# Patient Record
Sex: Female | Born: 1960
Health system: Southern US, Community
[De-identification: ages and names within clinical notes are randomized; demographics above are authoritative.]

## PROBLEM LIST (undated history)

## (undated) ENCOUNTER — Emergency Department: Admission: EM | Source: Home / Self Care

## (undated) ENCOUNTER — Ambulatory Visit (HOSPITAL_COMMUNITY): Admission: EM

## (undated) ENCOUNTER — Emergency Department (HOSPITAL_COMMUNITY): Admission: EM | Payer: BLUE CROSS/BLUE SHIELD | Source: Home / Self Care

## (undated) DIAGNOSIS — F329 Major depressive disorder, single episode, unspecified: Secondary | ICD-10-CM

## (undated) DIAGNOSIS — F419 Anxiety disorder, unspecified: Secondary | ICD-10-CM

## (undated) DIAGNOSIS — I10 Essential (primary) hypertension: Secondary | ICD-10-CM

## (undated) DIAGNOSIS — J189 Pneumonia, unspecified organism: Secondary | ICD-10-CM

## (undated) DIAGNOSIS — M1990 Unspecified osteoarthritis, unspecified site: Secondary | ICD-10-CM

## (undated) DIAGNOSIS — G8250 Quadriplegia, unspecified: Secondary | ICD-10-CM

## (undated) DIAGNOSIS — R011 Cardiac murmur, unspecified: Secondary | ICD-10-CM

## (undated) DIAGNOSIS — K219 Gastro-esophageal reflux disease without esophagitis: Secondary | ICD-10-CM

## (undated) DIAGNOSIS — E119 Type 2 diabetes mellitus without complications: Secondary | ICD-10-CM

## (undated) DIAGNOSIS — M5000 Cervical disc disorder with myelopathy, unspecified cervical region: Secondary | ICD-10-CM

## (undated) DIAGNOSIS — F319 Bipolar disorder, unspecified: Secondary | ICD-10-CM

## (undated) DIAGNOSIS — F32A Depression, unspecified: Secondary | ICD-10-CM

## (undated) HISTORY — PX: ABDOMINAL HYSTERECTOMY: SHX81

## (undated) HISTORY — DX: Quadriplegia, unspecified: G82.50

## (undated) HISTORY — DX: Cervical disc disorder with myelopathy, unspecified cervical region: M50.00

---

## 2000-09-07 ENCOUNTER — Inpatient Hospital Stay (HOSPITAL_COMMUNITY): Admission: AD | Admit: 2000-09-07 | Discharge: 2000-09-07 | Payer: Self-pay | Admitting: Obstetrics

## 2000-09-18 ENCOUNTER — Encounter: Payer: Self-pay | Admitting: Obstetrics and Gynecology

## 2000-09-18 ENCOUNTER — Encounter: Admission: RE | Admit: 2000-09-18 | Discharge: 2000-09-18 | Payer: Self-pay | Admitting: Obstetrics and Gynecology

## 2000-09-23 ENCOUNTER — Encounter: Payer: Self-pay | Admitting: Obstetrics and Gynecology

## 2000-09-23 ENCOUNTER — Encounter: Admission: RE | Admit: 2000-09-23 | Discharge: 2000-09-23 | Payer: Self-pay | Admitting: Obstetrics and Gynecology

## 2010-07-11 ENCOUNTER — Encounter: Admission: RE | Admit: 2010-07-11 | Discharge: 2010-07-11 | Payer: Self-pay | Admitting: Internal Medicine

## 2011-07-23 ENCOUNTER — Emergency Department (HOSPITAL_COMMUNITY)
Admission: EM | Admit: 2011-07-23 | Discharge: 2011-07-23 | Disposition: A | Discharge status: Home / Self Care | Payer: PRIVATE HEALTH INSURANCE | Attending: Emergency Medicine | Admitting: Emergency Medicine

## 2011-07-23 DIAGNOSIS — I10 Essential (primary) hypertension: Secondary | ICD-10-CM | POA: Insufficient documentation

## 2011-07-23 DIAGNOSIS — R109 Unspecified abdominal pain: Secondary | ICD-10-CM | POA: Insufficient documentation

## 2011-07-23 DIAGNOSIS — E119 Type 2 diabetes mellitus without complications: Secondary | ICD-10-CM | POA: Insufficient documentation

## 2011-07-23 DIAGNOSIS — K625 Hemorrhage of anus and rectum: Secondary | ICD-10-CM | POA: Insufficient documentation

## 2011-07-23 LAB — DIFFERENTIAL
Basophils Absolute: 0.1 10*3/uL (ref 0.0–0.1)
Basophils Relative: 1 % (ref 0–1)
Eosinophils Absolute: 0.1 10*3/uL (ref 0.0–0.7)
Eosinophils Relative: 1 % (ref 0–5)
Lymphocytes Relative: 34 % (ref 12–46)
Lymphs Abs: 3.5 10*3/uL (ref 0.7–4.0)
Monocytes Absolute: 0.5 10*3/uL (ref 0.1–1.0)
Monocytes Relative: 5 % (ref 3–12)
Neutro Abs: 6 10*3/uL (ref 1.7–7.7)
Neutrophils Relative %: 59 % (ref 43–77)

## 2011-07-23 LAB — CBC
HCT: 38.8 % (ref 36.0–46.0)
Hemoglobin: 13 g/dL (ref 12.0–15.0)
MCH: 29.7 pg (ref 26.0–34.0)
MCHC: 33.5 g/dL (ref 30.0–36.0)
MCV: 88.6 fL (ref 78.0–100.0)
Platelets: 295 10*3/uL (ref 150–400)
RBC: 4.38 MIL/uL (ref 3.87–5.11)
RDW: 14.5 % (ref 11.5–15.5)
WBC: 10.1 10*3/uL (ref 4.0–10.5)

## 2011-07-23 LAB — BASIC METABOLIC PANEL
CO2: 29 mEq/L (ref 19–32)
Calcium: 9.9 mg/dL (ref 8.4–10.5)
Glucose, Bld: 119 mg/dL — ABNORMAL HIGH (ref 70–99)
Potassium: 3 mEq/L — ABNORMAL LOW (ref 3.5–5.1)
Sodium: 143 mEq/L (ref 135–145)

## 2011-09-18 ENCOUNTER — Other Ambulatory Visit: Payer: Self-pay | Admitting: Internal Medicine

## 2011-09-18 DIAGNOSIS — Z1231 Encounter for screening mammogram for malignant neoplasm of breast: Secondary | ICD-10-CM

## 2011-10-14 ENCOUNTER — Ambulatory Visit
Admission: RE | Admit: 2011-10-14 | Discharge: 2011-10-14 | Disposition: A | Discharge status: Home / Self Care | Payer: PRIVATE HEALTH INSURANCE | Source: Ambulatory Visit | Attending: Internal Medicine | Admitting: Internal Medicine

## 2011-10-14 DIAGNOSIS — Z1231 Encounter for screening mammogram for malignant neoplasm of breast: Secondary | ICD-10-CM

## 2012-08-12 ENCOUNTER — Encounter (HOSPITAL_COMMUNITY): Payer: Self-pay | Admitting: *Deleted

## 2012-08-12 ENCOUNTER — Inpatient Hospital Stay (HOSPITAL_COMMUNITY)
Admission: AD | Admit: 2012-08-12 | Discharge: 2012-08-12 | Disposition: A | Discharge status: Home / Self Care | Payer: PRIVATE HEALTH INSURANCE | Source: Ambulatory Visit | Attending: Obstetrics & Gynecology | Admitting: Obstetrics & Gynecology

## 2012-08-12 DIAGNOSIS — T148XXA Other injury of unspecified body region, initial encounter: Secondary | ICD-10-CM

## 2012-08-12 DIAGNOSIS — S335XXA Sprain of ligaments of lumbar spine, initial encounter: Secondary | ICD-10-CM | POA: Insufficient documentation

## 2012-08-12 DIAGNOSIS — R109 Unspecified abdominal pain: Secondary | ICD-10-CM | POA: Insufficient documentation

## 2012-08-12 HISTORY — DX: Gastro-esophageal reflux disease without esophagitis: K21.9

## 2012-08-12 HISTORY — DX: Type 2 diabetes mellitus without complications: E11.9

## 2012-08-12 LAB — URINALYSIS, ROUTINE W REFLEX MICROSCOPIC
Glucose, UA: NEGATIVE mg/dL
Hgb urine dipstick: NEGATIVE
Specific Gravity, Urine: >1.03 — ABNORMAL HIGH (ref 1.005–1.030)

## 2012-08-12 MED ORDER — CYCLOBENZAPRINE HCL 10 MG PO TABS: 5.0000 mg | ORAL_TABLET | Freq: Two times a day (BID) | ORAL | Status: DC | PRN

## 2012-08-12 NOTE — MAU Provider Note (Signed)
History     CSN: [id number]  Arrival date and time: 08/12/12 1121   First Provider Initiated Contact with Patient 08/12/12 1151      Chief Complaint  Patient presents with  . Abdominal Pain   HPI Ashley [name] is a 51 y.o. female who presents to MAU with left side pain pain. The pan started 2 days ago and is getting worse. The pain is located in the left side. She rates the pain as 7/10. The pain comes and goes. The pain increaseswith standing, going from sitting to standing . She reports that she remembers going up the steps at home and feeling a pulling in her left side. The pain has continued off and on since then. Has not taken anything for pain. Total abdominal hysterectomy. She denies abdominal pain. Regular health care at [facility]. The history was provided by the patient.  OB History    Grav Para Term Preterm Abortions TAB SAB Ect Mult Living   4 3 2  1    1 4       Past Medical History  Diagnosis Date  . Diabetes mellitus without complication   . GERD (gastroesophageal reflux disease)     Past Surgical History  Procedure Date  . Abdominal hysterectomy     No family history on file.  History  Substance Use Topics  . Smoking status: Current Every Day Smoker  . Smokeless tobacco: Not on file  . Alcohol Use: No    Allergies: No Known Allergies  Prescriptions prior to admission  Medication Sig Dispense Refill  . fish oil-omega-3 fatty acids 1000 MG capsule Take 1 g by mouth daily.      . metFORMIN (GLUCOPHAGE) 500 MG tablet Take 1,000 mg by mouth daily with breakfast.      . pravastatin (PRAVACHOL) 20 MG tablet Take 20 mg by mouth daily.      . QUEtiapine (SEROQUEL) 400 MG tablet Take 800 mg by mouth at bedtime.      . sertraline (ZOLOFT) 100 MG tablet Take 200 mg by mouth at bedtime.      . thiamine (VITAMIN B-1) 100 MG tablet Take 100 mg by mouth daily.      Marland Kitchen zolpidem (AMBIEN CR) 6.25 MG CR tablet Take 6.25 mg by mouth at bedtime as needed. For sleep         Review of Systems  Constitutional: Negative for fever, chills and weight loss.  HENT: Negative for ear pain, nosebleeds, congestion, sore throat and neck pain.   Eyes: Negative for blurred vision, double vision, photophobia and pain.  Respiratory: Negative for cough, shortness of breath and wheezing.   Cardiovascular: Negative for chest pain, palpitations and leg swelling.  Gastrointestinal: Negative for heartburn, nausea, vomiting, abdominal pain, diarrhea and constipation.  Genitourinary: Positive for frequency. Negative for dysuria and urgency.  Musculoskeletal: Positive for back pain. Negative for myalgias.  Skin: Negative for itching and rash.  Neurological: Negative for dizziness, sensory change, speech change, seizures, weakness and headaches.  Endo/Heme/Allergies: Does not bruise/bleed easily.  Psychiatric/Behavioral: Negative for depression. The patient is nervous/anxious. The patient does not have insomnia.    Physical Exam   Blood pressure 131/74, pulse 90, temperature 97.9 F (36.6 C), temperature source Oral, resp. rate 18, height 5\' 7"  (1.702 m), weight 181 lb (82.101 kg).  Physical Exam  Nursing note and vitals reviewed. Constitutional: She is oriented to person, place, and time. She appears well-developed and well-nourished. No distress.  HENT:  Head: Normocephalic  and atraumatic.  Eyes: EOM are normal.  Neck: Neck supple.  Cardiovascular: Normal rate.   Respiratory: Effort normal.  GI: Soft. There is no tenderness.  Musculoskeletal: She exhibits no edema.       Tender with palpation and range of motion left lumbar area.   Neurological: She is alert and oriented to person, place, and time. She has normal strength and normal reflexes. No cranial nerve deficit or sensory deficit. Coordination normal.       Pedal pulses strong and equal bilateral.  Skin: Skin is warm and dry.  Psychiatric: She has a normal mood and affect. Her behavior is normal. Judgment and  thought content normal.   Results for orders placed during the hospital encounter of 08/12/12 (from the past 24 hour(s))  URINALYSIS, ROUTINE W REFLEX MICROSCOPIC     Status: Abnormal   Collection Time   08/12/12 11:30 AM      Component Value Range   Color, Urine YELLOW  YELLOW   APPearance CLEAR  CLEAR   Specific Gravity, Urine >1.030 (*) 1.005 - 1.030   pH 6.0  5.0 - 8.0   Glucose, UA NEGATIVE  NEGATIVE mg/dL   Hgb urine dipstick NEGATIVE  NEGATIVE   Bilirubin Urine NEGATIVE  NEGATIVE   Ketones, ur NEGATIVE  NEGATIVE mg/dL   Protein, ur NEGATIVE  NEGATIVE mg/dL   Urobilinogen, UA 0.2  0.0 - 1.0 mg/dL   Nitrite NEGATIVE  NEGATIVE   Leukocytes, UA NEGATIVE  NEGATIVE   Assessment: 51 y.o. female with left lumbar strain  Plan:  Tylenol prn   Rx flexeril   Follow up with PCP @ [facility]  Discussed with the patient and all questioned fully answered. .   Medication List     As of 08/12/2012 12:28 PM    START taking these medications         cyclobenzaprine 10 MG tablet   Commonly known as: FLEXERIL   Take 0.5 tablets (5 mg total) by mouth 2 (two) times daily as needed for muscle spasms.      CONTINUE taking these medications         fish oil-omega-3 fatty acids 1000 MG capsule      metFORMIN 500 MG tablet   Commonly known as: GLUCOPHAGE      pravastatin 20 MG tablet   Commonly known as: PRAVACHOL      QUEtiapine 400 MG tablet   Commonly known as: SEROQUEL      sertraline 100 MG tablet   Commonly known as: ZOLOFT      thiamine 100 MG tablet   Commonly known as: VITAMIN B-1      zolpidem 6.25 MG CR tablet   Commonly known as: AMBIEN CR          Where to get your medications    These are the prescriptions that you need to pick up.   You may get these medications from any pharmacy.         cyclobenzaprine 10 MG tablet            MAU Course  Procedures Ashley Miyamoto, RN, FNP, [facility] 08/12/2012, 12:20 PM

## 2012-08-12 NOTE — MAU Provider Note (Signed)
Attestation of Attending Supervision of Advanced Practitioner (CNM/NP): Evaluation and management procedures were performed by the Advanced Practitioner under my supervision and collaboration.  I have reviewed the Advanced Practitioner's note and chart, and I agree with the management and plan.  HARRAWAY-SMITH, Alyannah Sanks 4:14 PM     

## 2012-08-12 NOTE — MAU Note (Signed)
Pt reports having pain in her left abd and side that started about 5 days ago. Had unprotected intercourse last week. Denies vaginal discharge.

## 2013-01-04 ENCOUNTER — Other Ambulatory Visit: Payer: Self-pay

## 2013-01-04 DIAGNOSIS — Z1231 Encounter for screening mammogram for malignant neoplasm of breast: Secondary | ICD-10-CM

## 2013-02-03 ENCOUNTER — Ambulatory Visit
Admission: RE | Admit: 2013-02-03 | Discharge: 2013-02-03 | Disposition: A | Discharge status: Home / Self Care | Payer: Self-pay | Source: Ambulatory Visit

## 2013-02-03 DIAGNOSIS — Z1231 Encounter for screening mammogram for malignant neoplasm of breast: Secondary | ICD-10-CM

## 2013-02-07 ENCOUNTER — Other Ambulatory Visit: Payer: Self-pay | Admitting: Nurse Practitioner

## 2013-02-07 ENCOUNTER — Ambulatory Visit
Admission: RE | Admit: 2013-02-07 | Discharge: 2013-02-07 | Disposition: A | Discharge status: Home / Self Care | Payer: Federal, State, Local not specified - PPO | Source: Ambulatory Visit | Attending: Nurse Practitioner | Admitting: Nurse Practitioner

## 2013-02-07 DIAGNOSIS — R062 Wheezing: Secondary | ICD-10-CM

## 2013-02-07 DIAGNOSIS — R0989 Other specified symptoms and signs involving the circulatory and respiratory systems: Secondary | ICD-10-CM

## 2013-04-30 ENCOUNTER — Encounter (HOSPITAL_COMMUNITY): Payer: Self-pay | Admitting: Emergency Medicine

## 2013-04-30 ENCOUNTER — Inpatient Hospital Stay (HOSPITAL_COMMUNITY)
Admission: EM | Admit: 2013-04-30 | Discharge: 2013-05-03 | DRG: 392 | Disposition: A | Discharge status: Home / Self Care | Payer: Medicare Other | Attending: Internal Medicine | Admitting: Internal Medicine

## 2013-04-30 DIAGNOSIS — E872 Acidosis, unspecified: Secondary | ICD-10-CM | POA: Diagnosis present

## 2013-04-30 DIAGNOSIS — E1165 Type 2 diabetes mellitus with hyperglycemia: Secondary | ICD-10-CM | POA: Diagnosis present

## 2013-04-30 DIAGNOSIS — E860 Dehydration: Secondary | ICD-10-CM | POA: Diagnosis present

## 2013-04-30 DIAGNOSIS — F1210 Cannabis abuse, uncomplicated: Secondary | ICD-10-CM | POA: Diagnosis present

## 2013-04-30 DIAGNOSIS — F3289 Other specified depressive episodes: Secondary | ICD-10-CM | POA: Diagnosis present

## 2013-04-30 DIAGNOSIS — F17200 Nicotine dependence, unspecified, uncomplicated: Secondary | ICD-10-CM | POA: Diagnosis present

## 2013-04-30 DIAGNOSIS — K219 Gastro-esophageal reflux disease without esophagitis: Secondary | ICD-10-CM | POA: Diagnosis present

## 2013-04-30 DIAGNOSIS — K529 Noninfective gastroenteritis and colitis, unspecified: Secondary | ICD-10-CM | POA: Diagnosis present

## 2013-04-30 DIAGNOSIS — E785 Hyperlipidemia, unspecified: Secondary | ICD-10-CM | POA: Diagnosis present

## 2013-04-30 DIAGNOSIS — E119 Type 2 diabetes mellitus without complications: Secondary | ICD-10-CM

## 2013-04-30 DIAGNOSIS — F329 Major depressive disorder, single episode, unspecified: Secondary | ICD-10-CM

## 2013-04-30 DIAGNOSIS — A09 Infectious gastroenteritis and colitis, unspecified: Principal | ICD-10-CM | POA: Diagnosis present

## 2013-04-30 LAB — CBC WITH DIFFERENTIAL/PLATELET
Eosinophils Relative: 0 % (ref 0–5)
HCT: 40.1 % (ref 36.0–46.0)
Lymphocytes Relative: 12 % (ref 12–46)
Lymphs Abs: 2.2 10*3/uL (ref 0.7–4.0)
MCV: 89.5 fL (ref 78.0–100.0)
Monocytes Absolute: 1.3 10*3/uL — ABNORMAL HIGH (ref 0.1–1.0)
RBC: 4.48 MIL/uL (ref 3.87–5.11)
WBC: 18.3 10*3/uL — ABNORMAL HIGH (ref 4.0–10.5)

## 2013-04-30 NOTE — ED Notes (Signed)
PT. REPORTS RECTAL BLEEDING / BLOODY STOOLS ONSET YESTERDAY , DENIES FEVER OR CHILLS , NO ABDOMINAL PAIN .

## 2013-05-01 ENCOUNTER — Emergency Department (HOSPITAL_COMMUNITY): Payer: Medicare Other

## 2013-05-01 ENCOUNTER — Encounter (HOSPITAL_COMMUNITY): Payer: Self-pay | Admitting: Radiology

## 2013-05-01 DIAGNOSIS — E1165 Type 2 diabetes mellitus with hyperglycemia: Secondary | ICD-10-CM | POA: Diagnosis present

## 2013-05-01 DIAGNOSIS — K5289 Other specified noninfective gastroenteritis and colitis: Secondary | ICD-10-CM

## 2013-05-01 DIAGNOSIS — E785 Hyperlipidemia, unspecified: Secondary | ICD-10-CM | POA: Diagnosis present

## 2013-05-01 DIAGNOSIS — E119 Type 2 diabetes mellitus without complications: Secondary | ICD-10-CM

## 2013-05-01 DIAGNOSIS — K529 Noninfective gastroenteritis and colitis, unspecified: Secondary | ICD-10-CM | POA: Diagnosis present

## 2013-05-01 LAB — CBC WITH DIFFERENTIAL/PLATELET
Basophils Absolute: 0 10*3/uL (ref 0.0–0.1)
Eosinophils Relative: 0 % (ref 0–5)
HCT: 38 % (ref 36.0–46.0)
Hemoglobin: 13.5 g/dL (ref 12.0–15.0)
Lymphocytes Relative: 19 % (ref 12–46)
MCV: 89.4 fL (ref 78.0–100.0)
Monocytes Absolute: 1.3 10*3/uL — ABNORMAL HIGH (ref 0.1–1.0)
Monocytes Relative: 7 % (ref 3–12)
Neutro Abs: 13.7 10*3/uL — ABNORMAL HIGH (ref 1.7–7.7)
RDW: 13.8 % (ref 11.5–15.5)
WBC: 18.5 10*3/uL — ABNORMAL HIGH (ref 4.0–10.5)

## 2013-05-01 LAB — GLUCOSE, CAPILLARY
Glucose-Capillary: 226 mg/dL — ABNORMAL HIGH (ref 70–99)
Glucose-Capillary: 179 mg/dL — ABNORMAL HIGH (ref 70–99)

## 2013-05-01 LAB — COMPREHENSIVE METABOLIC PANEL
ALT: 19 U/L (ref 0–35)
Albumin: 4 g/dL (ref 3.5–5.2)
BUN: 26 mg/dL — ABNORMAL HIGH (ref 6–23)
CO2: 18 mEq/L — ABNORMAL LOW (ref 19–32)
Calcium: 10.4 mg/dL (ref 8.4–10.5)
Calcium: 9.7 mg/dL (ref 8.4–10.5)
Creatinine, Ser: 1.11 mg/dL — ABNORMAL HIGH (ref 0.50–1.10)
GFR calc Af Amer: 65 mL/min — ABNORMAL LOW (ref >90)
GFR calc Af Amer: 79 mL/min — ABNORMAL LOW (ref >90)
GFR calc non Af Amer: 56 mL/min — ABNORMAL LOW (ref >90)
Glucose, Bld: 234 mg/dL — ABNORMAL HIGH (ref 70–99)
Glucose, Bld: 209 mg/dL — ABNORMAL HIGH (ref 70–99)
Sodium: 132 mEq/L — ABNORMAL LOW (ref 135–145)
Total Bilirubin: 0.4 mg/dL (ref 0.3–1.2)
Total Protein: 7.5 g/dL (ref 6.0–8.3)

## 2013-05-01 LAB — URINALYSIS, ROUTINE W REFLEX MICROSCOPIC
Bilirubin Urine: NEGATIVE
Nitrite: NEGATIVE
Protein, ur: 100 mg/dL — AB
Specific Gravity, Urine: 1.024 (ref 1.005–1.030)
Urobilinogen, UA: 0.2 mg/dL (ref 0.0–1.0)

## 2013-05-01 LAB — URINE MICROSCOPIC-ADD ON

## 2013-05-01 MED ORDER — IOHEXOL 300 MG/ML  SOLN
100.0000 mL | Freq: Once | INTRAMUSCULAR | Status: AC | PRN
  Administered 2013-05-01: 100 mL via INTRAVENOUS

## 2013-05-01 MED ORDER — SIMVASTATIN 10 MG PO TABS
10.0000 mg | ORAL_TABLET | Freq: Every day | ORAL | Status: DC
  Administered 2013-05-02: 10 mg via ORAL
  Filled 2013-05-01 (×3): qty 1

## 2013-05-01 MED ORDER — INSULIN ASPART 100 UNIT/ML ~~LOC~~ SOLN
0.0000 [IU] | Freq: Three times a day (TID) | SUBCUTANEOUS | Status: DC
  Administered 2013-05-01 (×2): 3 [IU] via SUBCUTANEOUS
  Administered 2013-05-01 – 2013-05-02 (×2): 2 [IU] via SUBCUTANEOUS
  Administered 2013-05-02: 3 [IU] via SUBCUTANEOUS

## 2013-05-01 MED ORDER — ONDANSETRON HCL 4 MG PO TABS: 4.0000 mg | ORAL_TABLET | Freq: Four times a day (QID) | ORAL | Status: DC | PRN

## 2013-05-01 MED ORDER — VITAMIN B-1 100 MG PO TABS
100.0000 mg | ORAL_TABLET | Freq: Every day | ORAL | Status: DC
  Administered 2013-05-01 – 2013-05-03 (×3): 100 mg via ORAL
  Filled 2013-05-01 (×4): qty 1

## 2013-05-01 MED ORDER — METRONIDAZOLE IN NACL 5-0.79 MG/ML-% IV SOLN
500.0000 mg | Freq: Once | INTRAVENOUS | Status: AC
  Administered 2013-05-01: 500 mg via INTRAVENOUS
  Filled 2013-05-01: qty 100

## 2013-05-01 MED ORDER — CIPROFLOXACIN IN D5W 400 MG/200ML IV SOLN
400.0000 mg | Freq: Two times a day (BID) | INTRAVENOUS | Status: DC
  Administered 2013-05-01 – 2013-05-02 (×3): 400 mg via INTRAVENOUS
  Filled 2013-05-01 (×4): qty 200

## 2013-05-01 MED ORDER — ONDANSETRON HCL 4 MG/2ML IJ SOLN
4.0000 mg | Freq: Four times a day (QID) | INTRAMUSCULAR | Status: DC | PRN
  Administered 2013-05-01: 4 mg via INTRAVENOUS
  Filled 2013-05-01: qty 2

## 2013-05-01 MED ORDER — MORPHINE SULFATE 2 MG/ML IJ SOLN: 1.0000 mg | INTRAMUSCULAR | Status: DC | PRN

## 2013-05-01 MED ORDER — CHLORHEXIDINE GLUCONATE 0.12 % MT SOLN: 15.0000 mL | Freq: Two times a day (BID) | OROMUCOSAL | Status: DC

## 2013-05-01 MED ORDER — SODIUM CHLORIDE 0.9 % IV SOLN: Freq: Once | INTRAVENOUS | Status: DC

## 2013-05-01 MED ORDER — METRONIDAZOLE IN NACL 5-0.79 MG/ML-% IV SOLN
500.0000 mg | Freq: Three times a day (TID) | INTRAVENOUS | Status: DC
  Administered 2013-05-01 – 2013-05-02 (×4): 500 mg via INTRAVENOUS
  Filled 2013-05-01 (×7): qty 100

## 2013-05-01 MED ORDER — ZOLPIDEM TARTRATE 5 MG PO TABS: 5.0000 mg | ORAL_TABLET | Freq: Every evening | ORAL | Status: DC | PRN

## 2013-05-01 MED ORDER — SODIUM CHLORIDE 0.9 % IV BOLUS (SEPSIS)
1000.0000 mL | Freq: Once | INTRAVENOUS | Status: AC
  Administered 2013-05-01: 1000 mL via INTRAVENOUS

## 2013-05-01 MED ORDER — ACETAMINOPHEN 650 MG RE SUPP: 650.0000 mg | Freq: Four times a day (QID) | RECTAL | Status: DC | PRN

## 2013-05-01 MED ORDER — IOHEXOL 300 MG/ML  SOLN
20.0000 mL | INTRAMUSCULAR | Status: DC
  Administered 2013-05-01: 25 mL via ORAL

## 2013-05-01 MED ORDER — BIOTENE DRY MOUTH MT LIQD
15.0000 mL | Freq: Two times a day (BID) | OROMUCOSAL | Status: DC
  Administered 2013-05-01 (×2): 15 mL via OROMUCOSAL

## 2013-05-01 MED ORDER — CIPROFLOXACIN IN D5W 400 MG/200ML IV SOLN
400.0000 mg | Freq: Once | INTRAVENOUS | Status: AC
  Administered 2013-05-01: 400 mg via INTRAVENOUS
  Filled 2013-05-01: qty 200

## 2013-05-01 MED ORDER — SERTRALINE HCL 100 MG PO TABS
100.0000 mg | ORAL_TABLET | Freq: Every day | ORAL | Status: DC
  Administered 2013-05-01 – 2013-05-02 (×2): 100 mg via ORAL
  Filled 2013-05-01 (×3): qty 1

## 2013-05-01 MED ORDER — SODIUM CHLORIDE 0.9 % IV SOLN: INTRAVENOUS | Status: AC

## 2013-05-01 MED ORDER — ACETAMINOPHEN 325 MG PO TABS: 650.0000 mg | ORAL_TABLET | Freq: Four times a day (QID) | ORAL | Status: DC | PRN

## 2013-05-01 MED ORDER — QUETIAPINE FUMARATE 400 MG PO TABS
800.0000 mg | ORAL_TABLET | Freq: Once | ORAL | Status: AC
  Administered 2013-05-01: 800 mg via ORAL
  Filled 2013-05-01: qty 2

## 2013-05-01 MED ORDER — QUETIAPINE FUMARATE 400 MG PO TABS
800.0000 mg | ORAL_TABLET | Freq: Every day | ORAL | Status: DC
  Administered 2013-05-01 – 2013-05-02 (×2): 800 mg via ORAL
  Filled 2013-05-01 (×3): qty 2

## 2013-05-01 NOTE — ED Notes (Signed)
The pt returned  From xray//  The c-t tech  Disconnected her cipro while in c-t

## 2013-05-01 NOTE — H&P (Signed)
[facility] History and Physical  Ashley [name] ZOX:[id number] DOB: 1961-02-22 DOA: 04/30/2013  Referring physician: ER physician. PCP: No primary provider on file.   Chief Complaint: Abdominal pain.  HPI: Ashley [name] is a 52 y.o. female history of diabetes mellitus type 2 and hyperlipidemia and ongoing tobacco abuse presents with complaints of abdominal pain. Patient has been having abdominal pain last 2 days with frequent bowel movement which was bloody. The pain is diffuse colicky nature. Denies any associated nausea vomiting or any fever chills. Denies having used any recent antibiotics or was recently hospitalized. Patient states she had a similar episode 2 years ago and had colonoscopy. CT abdomen pelvis done shows features concerning for infectious versus inflammatory colitis involving the descending and sigmoid colon. Patient has been admitted for further management. Patient otherwise denies any chest pain or shortness of breath.  Review of Systems: As presented in the history of presenting illness, rest negative.  Past Medical History  Diagnosis Date  . Diabetes mellitus without complication   . GERD (gastroesophageal reflux disease)    Past Surgical History  Procedure Laterality Date  . Abdominal hysterectomy     Social History:  reports that she has been smoking.  She does not have any smokeless tobacco history on file. She reports that she uses illicit drugs (Marijuana). She reports that she does not drink alcohol. Home. where does patient live-- Can do ADLs. Can patient participate in ADLs?  No Known Allergies  Family History  Problem Relation Age of Onset  . Diabetes Mellitus II Father       Prior to Admission medications   Medication Sig Start Date End Date Taking? Authorizing Provider  metFORMIN (GLUCOPHAGE) 500 MG tablet Take 1,000 mg by mouth daily with breakfast.   Yes Historical Provider, MD  pravastatin (PRAVACHOL) 20 MG tablet Take 20 mg by  mouth daily.   Yes Historical Provider, MD  QUEtiapine (SEROQUEL) 400 MG tablet Take 800 mg by mouth at bedtime.   Yes Historical Provider, MD  thiamine (VITAMIN B-1) 100 MG tablet Take 100 mg by mouth daily.   Yes Historical Provider, MD  zolpidem (AMBIEN CR) 6.25 MG CR tablet Take 6.25 mg by mouth at bedtime as needed. For sleep   Yes Historical Provider, MD  sertraline (ZOLOFT) 100 MG tablet Take 100 mg by mouth at bedtime.     Historical Provider, MD   Physical Exam: Filed Vitals:   04/30/13 2311 05/01/13 0124  BP: 137/90 153/96  Pulse: 116 94  Temp: 99.1 F (37.3 C)   TempSrc: Oral   Resp: 18 20  SpO2: 96% 96%     General:  Well-developed well-nourished.  Eyes: Anicteric no pallor.  ENT: No discharge from the ears eyes nose or mouth.  Neck: No mass felt.  Cardiovascular: S1-S2 heard.  Respiratory: No rhonchi or crepitations.  Abdomen: Soft nontender bowel sounds present.  Skin: No rash.  Musculoskeletal: No edema.  Psychiatric: Appears normal.  Neurologic: Alert and oriented to time place and person. Moves all extremities.  Labs on Admission:  Basic Metabolic Panel:  Recent Labs Lab 04/30/13 2323  NA 132*  K 4.5  CL 97  CO2 18*  GLUCOSE 234*  BUN 37*  CREATININE 1.11*  CALCIUM 10.4   Liver Function Tests:  Recent Labs Lab 04/30/13 2323  AST 15  ALT 19  ALKPHOS 86  BILITOT 0.4  PROT 8.1  ALBUMIN 4.4   No results found for this basename: LIPASE, AMYLASE,  in  the last 168 hours No results found for this basename: AMMONIA,  in the last 168 hours CBC:  Recent Labs Lab 04/30/13 2323  WBC 18.3*  NEUTROABS 14.7*  HGB 14.1  HCT 40.1  MCV 89.5  PLT 281   Cardiac Enzymes: No results found for this basename: CKTOTAL, CKMB, CKMBINDEX, TROPONINI,  in the last 168 hours  BNP (last 3 results) No results found for this basename: PROBNP,  in the last 8760 hours CBG: No results found for this basename: GLUCAP,  in the last 168  hours  Radiological Exams on Admission: Ct Abdomen Pelvis W Contrast  05/01/2013   *RADIOLOGY REPORT*  Clinical Data: Painless rectal bleeding.  CT ABDOMEN AND PELVIS WITH CONTRAST  Technique:  Multidetector CT imaging of the abdomen and pelvis was performed following the standard protocol during bolus administration of intravenous contrast.  Contrast: OMNIPAQUE IOHEXOL 300 MG/ML  SOLN  Comparison: None.  Findings:  BODY WALL: Unremarkable.  LOWER CHEST:  Mediastinum: Unremarkable.  Lungs/pleura: No consolidation.  ABDOMEN/PELVIS:  Liver: 3.4 cm mass in segment six.  On the portal venous phase there is peripheral nodular enhancement that increases on delayed imaging.  No additional focal abnormality detected.  Biliary: No evidence of biliary obstruction or stone.  Pancreas: Unremarkable.  Spleen: Unremarkable.  Adrenals: Unremarkable.  Kidneys and ureters: No hydronephrosis or stone. 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  Bladder: Unremarkable.  Bowel: Circumferential bowel wall thickening beginning at the splenic flexure and continuing to the rectosigmoid junction.  There is pericolonic fat stranding.  Although the extensive submucosal edema distorts anatomy, there appears to be mucosal and serosal enhancement along the affected segment.  The IMA and IMV are patent.  No bowel obstruction.  Normal appendix.  Retroperitoneum: No mass or adenopathy.  Peritoneum: No free fluid or gas.  Reproductive: Hysterectomy.  Vascular: Aortic atherosclerosis, most notably at the aortic hiatus.  OSSEOUS: No acute abnormalities. Remote inferior pubic ramus fractures bilaterally.  Advanced degenerative disc disease at L5- L1.  IMPRESSION: 1. Colitis of the descending and sigmoid colon.  The extent favors infectious/inflammatory colitis over ischemic colitis.  2.  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  3.  Bilateral renal cortical scarring, pattern  favoring prior vesicoureteral reflux.   Original Report Authenticated By: Tiburcio Pea     Assessment/Plan Principal Problem:   Diabetes mellitus Active Problems:   Colitis   Hyperlipidemia   1. Abdominal pain secondary to colitis - per CT it should be an infectious versus inflammatory. Check stool for C. difficile and lactic acid levels. Patient has been placed on Flagyl and Cipro. Gently hydrate. Pain relief medications. Since patient has blood in the stools closely follow CBC. 2. Metabolic acidosis - probably secondary to dehydration and also has to rule out any lactic acidosis. Check lactate levels and gently hydrate. 3. Diabetes mellitus type 2 - patient has been placed on sliding-scale coverage. 4. Hyperlipidemia - continue statins. 5. Tobacco abuse - cessation counseling requested.    Code Status: Full code.  Family Communication:  none.  Disposition Plan: Admit to inpatient.    KAKRAKANDY,ARSHAD N. [facility] Pager [phone].  If 7PM-7AM, please contact night-coverage [email or web address] Password [facility] 05/01/2013, 5:03 AM

## 2013-05-01 NOTE — Progress Notes (Signed)
Patient admitted after midnight. Chart reviewed. Stool sample not yet collected. Patient is feeling better. Will advance diet. She is hungry. Will get records from GI if we can find out her gastroenterologist as. She sees Dr. Lerry Liner on [address] Previously, she was going to all for care with Dr. Concepcion Elk.

## 2013-05-01 NOTE — ED Notes (Signed)
Pt asked could she obtain a urine sample. Pt stated that she could not at the time. Pt advised to call once she was able to get a sample. Will follow up shortly

## 2013-05-01 NOTE — ED Notes (Signed)
Up to the br  flaffyl  Infused oral contrast finsshed

## 2013-05-01 NOTE — ED Notes (Signed)
Pt c/o abd pain all day.  sleepy

## 2013-05-01 NOTE — Progress Notes (Signed)
Utilization Review Completed.Ashley [name] T7/27/2014  

## 2013-05-01 NOTE — ED Notes (Signed)
Report called to 6n but the pt has no admitting orders

## 2013-05-01 NOTE — ED Provider Notes (Signed)
CSN: [id number]     Arrival date & time 04/30/13  2306 History     First MD Initiated Contact with Patient 05/01/13 0012     Chief Complaint  Patient presents with  . Rectal Bleeding   (Consider location/radiation/quality/duration/timing/severity/associated sxs/prior Treatment) HPI 52 yo female presents to the ER with complaint of 2 days of BRBPR, bloody stools since yesterday along with nausea, generalized weakness.  Pt reports diverticulitis 2 years ago.  No fevers, vomiting.  Pt has only been taking sips of broth.  Denies abdominal pain.  No other complaints at this time.  Records reviewed, had ED visit for BRBPR 2 years ago, noted to have thrombosed hemorroid, was referred to [facility] and PCM.  Pt reports colonoscopy showed diverticuiltis.  No hospitalization or surgery required.    Past Medical History  Diagnosis Date  . Diabetes mellitus without complication   . GERD (gastroesophageal reflux disease)    Past Surgical History  Procedure Laterality Date  . Abdominal hysterectomy     No family history on file. History  Substance Use Topics  . Smoking status: Current Every Day Smoker  . Smokeless tobacco: Not on file  . Alcohol Use: No   OB History   Grav Para Term Preterm Abortions TAB SAB Ect Mult Living   4 3 2  1    1 4      Review of Systems  All other systems reviewed and are negative.    Allergies  Review of patient's allergies indicates no known allergies.  Home Medications   Current Outpatient Rx  Name  Route  Sig  Dispense  Refill  . metFORMIN (GLUCOPHAGE) 500 MG tablet   Oral   Take 1,000 mg by mouth daily with breakfast.         . pravastatin (PRAVACHOL) 20 MG tablet   Oral   Take 20 mg by mouth daily.         . QUEtiapine (SEROQUEL) 400 MG tablet   Oral   Take 800 mg by mouth at bedtime.         . thiamine (VITAMIN B-1) 100 MG tablet   Oral   Take 100 mg by mouth daily.         Marland Kitchen zolpidem (AMBIEN CR) 6.25 MG CR tablet   Oral    Take 6.25 mg by mouth at bedtime as needed. For sleep         . sertraline (ZOLOFT) 100 MG tablet   Oral   Take 100 mg by mouth at bedtime.           BP 137/90  Pulse 116  Temp(Src) 99.1 F (37.3 C) (Oral)  Resp 18  SpO2 96% Physical Exam  Nursing note and vitals reviewed. Constitutional: She is oriented to person, place, and time. She appears well-developed and well-nourished.  Uncomfortable appearing  HENT:  Head: Normocephalic and atraumatic.  Nose: Nose normal.  Mouth/Throat: Oropharynx is clear and moist.  Eyes: Conjunctivae and EOM are normal. Pupils are equal, round, and reactive to light.  Neck: Normal range of motion. Neck supple. No JVD present. No tracheal deviation present. No thyromegaly present.  Cardiovascular: Normal rate, regular rhythm, normal heart sounds and intact distal pulses.  Exam reveals no gallop and no friction rub.   No murmur heard. Pulmonary/Chest: Effort normal and breath sounds normal. No stridor. No respiratory distress. She has no wheezes. She has no rales. She exhibits no tenderness.  Abdominal: Soft. Bowel sounds are normal. She exhibits  no distension and no mass. There is tenderness (LLQ pain on palpation). There is no rebound and no guarding.  Genitourinary: Guaiac positive stool.  Musculoskeletal: Normal range of motion. She exhibits no edema and no tenderness.  Lymphadenopathy:    She has no cervical adenopathy.  Neurological: She is alert and oriented to person, place, and time. She exhibits normal muscle tone. Coordination normal.  Skin: Skin is warm and dry. No rash noted. No erythema. No pallor.  Psychiatric: She has a normal mood and affect. Her behavior is normal. Judgment and thought content normal.    ED Course   Procedures (including critical care time)  Labs Reviewed  CBC WITH DIFFERENTIAL - Abnormal; Notable for the following:    WBC 18.3 (*)    Neutrophils Relative % 80 (*)    Neutro Abs 14.7 (*)    Monocytes  Absolute 1.3 (*)    All other components within normal limits  COMPREHENSIVE METABOLIC PANEL - Abnormal; Notable for the following:    Sodium 132 (*)    CO2 18 (*)    Glucose, Bld 234 (*)    BUN 37 (*)    Creatinine, Ser 1.11 (*)    GFR calc non Af Amer 56 (*)    GFR calc Af Amer 65 (*)    All other components within normal limits  URINALYSIS, ROUTINE W REFLEX MICROSCOPIC - Abnormal; Notable for the following:    APPearance CLOUDY (*)    Hgb urine dipstick TRACE (*)    Protein, ur 100 (*)    Leukocytes, UA SMALL (*)    All other components within normal limits  URINE MICROSCOPIC-ADD ON - Abnormal; Notable for the following:    Squamous Epithelial / LPF FEW (*)    Bacteria, UA MANY (*)    Casts HYALINE CASTS (*)    All other components within normal limits  OCCULT BLOOD, POC DEVICE - Abnormal; Notable for the following:    Fecal Occult Bld POSITIVE (*)    All other components within normal limits  URINE CULTURE   Ct Abdomen Pelvis W Contrast  05/01/2013   *RADIOLOGY REPORT*  Clinical Data: Painless rectal bleeding.  CT ABDOMEN AND PELVIS WITH CONTRAST  Technique:  Multidetector CT imaging of the abdomen and pelvis was performed following the standard protocol during bolus administration of intravenous contrast.  Contrast: OMNIPAQUE IOHEXOL 300 MG/ML  SOLN  Comparison: None.  Findings:  BODY WALL: Unremarkable.  LOWER CHEST:  Mediastinum: Unremarkable.  Lungs/pleura: No consolidation.  ABDOMEN/PELVIS:  Liver: 3.4 cm mass in segment six.  On the portal venous phase there is peripheral nodular enhancement that increases on delayed imaging.  No additional focal abnormality detected.  Biliary: No evidence of biliary obstruction or stone.  Pancreas: Unremarkable.  Spleen: Unremarkable.  Adrenals: Unremarkable.  Kidneys and ureters: No hydronephrosis or stone. 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  Bladder: Unremarkable.  Bowel:  Circumferential bowel wall thickening beginning at the splenic flexure and continuing to the rectosigmoid junction.  There is pericolonic fat stranding.  Although the extensive submucosal edema distorts anatomy, there appears to be mucosal and serosal enhancement along the affected segment.  The IMA and IMV are patent.  No bowel obstruction.  Normal appendix.  Retroperitoneum: No mass or adenopathy.  Peritoneum: No free fluid or gas.  Reproductive: Hysterectomy.  Vascular: Aortic atherosclerosis, most notably at the aortic hiatus.  OSSEOUS: No acute abnormalities. Remote inferior pubic ramus fractures bilaterally.  Advanced degenerative  disc disease at L5- L1.  IMPRESSION: 1. Colitis of the descending and sigmoid colon.  The extent favors infectious/inflammatory colitis over ischemic colitis.  2.  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  3.  Bilateral renal cortical scarring, pattern favoring prior vesicoureteral reflux.   Original Report Authenticated By: Tiburcio Pea   1. Colitis, acute     MDM  52 yo female with bloody bm, h/o diverticulitis by her report.  Pt has LLQ pain on exam, leukocytosis, elevated BUN.  Will give iv cipro/flagyl for presumed diverticulitis, check CT scan for possible abscess.  May need admission given poor po intake.  Olivia Mackie, MD 05/01/13 [phone]

## 2013-05-02 DIAGNOSIS — K529 Noninfective gastroenteritis and colitis, unspecified: Secondary | ICD-10-CM

## 2013-05-02 DIAGNOSIS — F329 Major depressive disorder, single episode, unspecified: Secondary | ICD-10-CM

## 2013-05-02 LAB — BASIC METABOLIC PANEL
BUN: 15 mg/dL (ref 6–23)
Calcium: 9.2 mg/dL (ref 8.4–10.5)
Creatinine, Ser: 0.92 mg/dL (ref 0.50–1.10)
GFR calc Af Amer: 81 mL/min — ABNORMAL LOW (ref >90)
GFR calc non Af Amer: 70 mL/min — ABNORMAL LOW (ref >90)
Glucose, Bld: 175 mg/dL — ABNORMAL HIGH (ref 70–99)
Potassium: 4.6 mEq/L (ref 3.5–5.1)

## 2013-05-02 LAB — GLUCOSE, CAPILLARY: Glucose-Capillary: 241 mg/dL — ABNORMAL HIGH (ref 70–99)

## 2013-05-02 LAB — CBC
Hemoglobin: 13.1 g/dL (ref 12.0–15.0)
MCH: 31.7 pg (ref 26.0–34.0)
MCHC: 35 g/dL (ref 30.0–36.0)
RDW: 13.7 % (ref 11.5–15.5)

## 2013-05-02 LAB — URINE CULTURE

## 2013-05-02 LAB — HEMOGLOBIN A1C: Hgb A1c MFr Bld: 6 % — ABNORMAL HIGH (ref <5.7)

## 2013-05-02 MED ORDER — METRONIDAZOLE 500 MG PO TABS
500.0000 mg | ORAL_TABLET | Freq: Three times a day (TID) | ORAL | Status: DC
  Administered 2013-05-02 – 2013-05-03 (×3): 500 mg via ORAL
  Filled 2013-05-02 (×6): qty 1

## 2013-05-02 MED ORDER — HYDROCODONE-ACETAMINOPHEN 5-325 MG PO TABS: 1.0000 | ORAL_TABLET | ORAL | Status: DC | PRN

## 2013-05-02 MED ORDER — INSULIN ASPART 100 UNIT/ML ~~LOC~~ SOLN
0.0000 [IU] | Freq: Three times a day (TID) | SUBCUTANEOUS | Status: DC
  Administered 2013-05-03: 3 [IU] via SUBCUTANEOUS

## 2013-05-02 MED ORDER — INSULIN ASPART 100 UNIT/ML ~~LOC~~ SOLN: 0.0000 [IU] | Freq: Every day | SUBCUTANEOUS | Status: DC

## 2013-05-02 MED ORDER — CIPROFLOXACIN HCL 500 MG PO TABS
500.0000 mg | ORAL_TABLET | Freq: Two times a day (BID) | ORAL | Status: DC
  Administered 2013-05-02 – 2013-05-03 (×2): 500 mg via ORAL
  Filled 2013-05-02 (×4): qty 1

## 2013-05-02 MED ORDER — SIMETHICONE 80 MG PO CHEW
160.0000 mg | CHEWABLE_TABLET | Freq: Once | ORAL | Status: AC
  Administered 2013-05-02: 160 mg via ORAL
  Filled 2013-05-02 (×2): qty 2

## 2013-05-02 MED ORDER — INSULIN DETEMIR 100 UNIT/ML ~~LOC~~ SOLN
5.0000 [IU] | Freq: Every day | SUBCUTANEOUS | Status: DC
  Administered 2013-05-02: 5 [IU] via SUBCUTANEOUS
  Filled 2013-05-02 (×3): qty 0.05

## 2013-05-02 NOTE — Progress Notes (Signed)
[facility] PROGRESS NOTE  Ashley [name] ZOX:[id number] DOB: May 23, 1961 DOA: 04/30/2013 PCP: No primary provider on file.  Assessment/Plan:  Principal Problem:   Colitis presumed infectious: I called all the gastroenterologist in town. Patient has been seen by Dr. Madilyn Fireman, but apparently has fired for nonpayment or some other issue. She had a colonoscopy in 2012 which showed adenomatous polyp, diverticulosis, hemorrhoids. Indication was screening and hematochezia. This episode apparently is a different presentation, despite patient's report. Likely infection. C. difficile was negative. Cultures pending. Patient is improving clinically. We'll advance diet and change to oral antibiotics. Possibly home tomorrow if stable. Active Problems:   Diabetes mellitus   Hyperlipidemia   Depression   Antibiotics:  Cipro, flagyl  HPI/Subjective: Diarrhea becoming less frequent. No pain. Requesting solid food  Objective: Filed Vitals:   05/01/13 1445 05/01/13 2218 05/02/13 0624 05/02/13 1353  BP: 149/94 138/96 124/73 129/87  Pulse: 100 101 107 94  Temp: 98.9 F (37.2 C) 98.3 F (36.8 C) 98.7 F (37.1 C) 98.2 F (36.8 C)  TempSrc: Oral Oral  Oral  Resp: 18 17 17 16   Height:      Weight:      SpO2: 100% 98% 97% 100%    Intake/Output Summary (Last 24 hours) at 05/02/13 1618 Last data filed at 05/02/13 1342  Gross per 24 hour  Intake    775 ml  Output      0 ml  Net    775 ml   Filed Weights   05/01/13 0542  Weight: 78.3 kg (172 lb 9.9 oz)    Exam:   General:  Asleep. Arousable. Comfortable.  Cardiovascular: Regular rate rhythm without murmurs gallops or a  Respiratory: Clear to auscultation bilaterally without wheeze rhonchi or rales  Abdomen: Soft nontender nondistended  Musculoskeletal: No clubbing cyanosis or edema.  Data Reviewed: Basic Metabolic Panel:  Recent Labs Lab 04/30/13 2323 05/01/13 0848 05/02/13 0642  NA 132* 132* 134*  K 4.5 4.8 4.6  CL 97  98 101  CO2 18* 20 22  GLUCOSE 234* 209* 175*  BUN 37* 26* 15  CREATININE 1.11* 0.94 0.92  CALCIUM 10.4 9.7 9.2   Liver Function Tests:  Recent Labs Lab 04/30/13 2323 05/01/13 0848  AST 15 15  ALT 19 16  ALKPHOS 86 81  BILITOT 0.4 0.4  PROT 8.1 7.5  ALBUMIN 4.4 4.0   No results found for this basename: LIPASE, AMYLASE,  in the last 168 hours No results found for this basename: AMMONIA,  in the last 168 hours CBC:  Recent Labs Lab 04/30/13 2323 05/01/13 0848 05/02/13 0642  WBC 18.3* 18.5* 15.9*  NEUTROABS 14.7* 13.7*  --   HGB 14.1 13.5 13.1  HCT 40.1 38.0 37.4  MCV 89.5 89.4 90.6  PLT 281 237 242   Cardiac Enzymes: No results found for this basename: CKTOTAL, CKMB, CKMBINDEX, TROPONINI,  in the last 168 hours BNP (last 3 results) No results found for this basename: PROBNP,  in the last 8760 hours CBG:  Recent Labs Lab 05/01/13 1147 05/01/13 1733 05/01/13 2150 05/02/13 0756 05/02/13 1225  GLUCAP 226* 179* 217* 163* 241*    Recent Results (from the past 240 hour(s))  URINE CULTURE     Status: None   Collection Time    05/01/13  2:20 AM      Result Value Range Status   Specimen Description URINE, RANDOM   Final   Special Requests ADDED 0244   Final   Culture  Setup  Time 05/01/2013 02:58   Final   Colony Count 80,000 COLONIES/ML   Final   Culture     Final   Value: Multiple bacterial morphotypes present, none predominant. Suggest appropriate recollection if clinically indicated.   Report Status 05/02/2013 FINAL   Final  CLOSTRIDIUM DIFFICILE BY PCR     Status: None   Collection Time    05/01/13  5:00 PM      Result Value Range Status   C difficile by pcr NEGATIVE  NEGATIVE Final     Studies: Ct Abdomen Pelvis W Contrast  05/01/2013   *RADIOLOGY REPORT*  Clinical Data: Painless rectal bleeding.  CT ABDOMEN AND PELVIS WITH CONTRAST  Technique:  Multidetector CT imaging of the abdomen and pelvis was performed following the standard protocol during  bolus administration of intravenous contrast.  Contrast: OMNIPAQUE IOHEXOL 300 MG/ML  SOLN  Comparison: None.  Findings:  BODY WALL: Unremarkable.  LOWER CHEST:  Mediastinum: Unremarkable.  Lungs/pleura: No consolidation.  ABDOMEN/PELVIS:  Liver: 3.4 cm mass in segment six.  On the portal venous phase there is peripheral nodular enhancement that increases on delayed imaging.  No additional focal abnormality detected.  Biliary: No evidence of biliary obstruction or stone.  Pancreas: Unremarkable.  Spleen: Unremarkable.  Adrenals: Unremarkable.  Kidneys and ureters: No hydronephrosis or stone. 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  Bladder: Unremarkable.  Bowel: Circumferential bowel wall thickening beginning at the splenic flexure and continuing to the rectosigmoid junction.  There is pericolonic fat stranding.  Although the extensive submucosal edema distorts anatomy, there appears to be mucosal and serosal enhancement along the affected segment.  The IMA and IMV are patent.  No bowel obstruction.  Normal appendix.  Retroperitoneum: No mass or adenopathy.  Peritoneum: No free fluid or gas.  Reproductive: Hysterectomy.  Vascular: Aortic atherosclerosis, most notably at the aortic hiatus.  OSSEOUS: No acute abnormalities. Remote inferior pubic ramus fractures bilaterally.  Advanced degenerative disc disease at L5- L1.  IMPRESSION: 1. Colitis of the descending and sigmoid colon.  The extent favors infectious/inflammatory colitis over ischemic colitis.  2.  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  3.  Bilateral renal cortical scarring, pattern favoring prior vesicoureteral reflux.   Original Report Authenticated By: Tiburcio Pea    Scheduled Meds: . ciprofloxacin  400 mg Intravenous BID  . insulin aspart  0-9 Units Subcutaneous TID WC  . metronidazole  500 mg Intravenous Q8H  . QUEtiapine  800 mg Oral QHS  . sertraline  100 mg Oral QHS   . simvastatin  10 mg Oral q1800  . thiamine  100 mg Oral Daily   Continuous Infusions:   Time spent: 40 minutes  Kevan Prouty L  [facility] Pager [phone]. If 7PM-7AM, please contact night-coverage at [email or web address], password [facility] 05/02/2013, 4:18 PM  LOS: 2 days

## 2013-05-02 NOTE — Progress Notes (Signed)
Inpatient Diabetes Program Recommendations  AACE/ADA: New Consensus Statement on Inpatient Glycemic Control (2013)  Target Ranges:  Prepandial:   less than 140 mg/dL      Peak postprandial:   less than 180 mg/dL (1-2 hours)      Critically ill patients:  140 - 180 mg/dL     Results for Ashley [name], Ashley [name] (MRN [id number]) as of 05/02/2013 13:32  Ref. Range 05/01/2013 08:34 05/01/2013 11:47 05/01/2013 17:33 05/01/2013 21:50  Glucose-Capillary Latest Range: 70-99 mg/dL 409 (H) 811 (H) 914 (H) 217 (H)    Results for Ashley [name], Ashley [name] (MRN [id number]) as of 05/02/2013 13:32  Ref. Range 05/02/2013 07:56 05/02/2013 12:25  Glucose-Capillary Latest Range: 70-99 mg/dL 086 (H) 578 (H)    Inpatient Diabetes Program Recommendations Correction (SSI): Please increase Novolog correction scale (SSI) to Moderate scale tid ac + HS (currently on Sensitive scale)  Will follow. Ambrose Finland RN, MSN, CDE Diabetes Coordinator Inpatient Diabetes Program [phone]

## 2013-05-03 MED ORDER — PROMETHAZINE HCL 12.5 MG PO TABS: 12.5000 mg | ORAL_TABLET | Freq: Four times a day (QID) | ORAL | Status: DC | PRN

## 2013-05-03 MED ORDER — CIPROFLOXACIN HCL 500 MG PO TABS: 500.0000 mg | ORAL_TABLET | Freq: Two times a day (BID) | ORAL | Status: DC

## 2013-05-03 MED ORDER — ACETAMINOPHEN 325 MG PO TABS: 650.0000 mg | ORAL_TABLET | Freq: Four times a day (QID) | ORAL | Status: DC | PRN

## 2013-05-03 MED ORDER — METRONIDAZOLE 500 MG PO TABS: 500.0000 mg | ORAL_TABLET | Freq: Three times a day (TID) | ORAL | Status: DC

## 2013-05-03 MED ORDER — HYDROCODONE-ACETAMINOPHEN 5-325 MG PO TABS: 1.0000 | ORAL_TABLET | Freq: Four times a day (QID) | ORAL | Status: DC | PRN

## 2013-05-03 NOTE — Progress Notes (Signed)
DC home with daughter, verbally understood dc instructions, no questions ask.  

## 2013-05-03 NOTE — Discharge Summary (Signed)
Physician Discharge Summary  ONNA NODAL WUJ:[id number] DOB: [birth year] DOA: 04/30/2013  PCP: No primary provider on file.  Admit date: 04/30/2013 Discharge date: 05/03/2013  Time spent: greater than 30 minutes  Discharge Diagnoses:  Principal Problem:   Colitis presumed infectious Active Problems:   Diabetes mellitus   Hyperlipidemia   Depression  Discharge Condition: stable  Filed Weights   05/01/13 0542  Weight: 78.3 kg (172 lb 9.9 oz)    History of present illness:  Ashley [name] is a 52 y.o. female history of diabetes mellitus type 2 and hyperlipidemia and ongoing tobacco abuse presents with complaints of abdominal pain. Patient has been having abdominal pain last 2 days with frequent bowel movement which was bloody. The pain is diffuse colicky nature. Denies any associated nausea vomiting or any fever chills. Denies having used any recent antibiotics or was recently hospitalized. Patient states she had a similar episode 2 years ago and had colonoscopy. CT abdomen pelvis done shows features concerning for infectious versus inflammatory colitis involving the descending and sigmoid colon. Patient has been admitted for further management. Patient otherwise denies any chest pain or shortness of breath.   Hospital Course:  Admitted to hospitalist service. Started on empiric cipro and flagyl.  Records from [facility] obtained.  Had colonoscopy 2011 which showed hemorhoids, diverticula, and polyp.  Symptoms improved on antibiotics.  Diet advanced.  C diff negative. Stool culture negative to date.  If symptoms return, needs to f/u with GI as outpatient to consider repeat colonoscopy.  Procedures:  none  Consultations:  none  Discharge Exam: Filed Vitals:   05/02/13 0624 05/02/13 1353 05/02/13 2100 05/03/13 0556  BP: 124/73 129/87 128/92 104/66  Pulse: 107 94 105 99  Temp: 98.7 F (37.1 C) 98.2 F (36.8 C) 98.4 F (36.9 C) 98.3 F (36.8 C)  TempSrc:  Oral Oral   Resp: 17  16 17 17   Height:      Weight:      SpO2: 97% 100% 100% 95%    General: comfortable Abd:  S, NT, ND  Discharge Instructions  Discharge Orders   Future Orders Complete By Expires     Diet - low sodium heart healthy  As directed     Diet Carb Modified  As directed     Increase activity slowly  As directed         Medication List         acetaminophen 325 MG tablet  Commonly known as:  TYLENOL  Take 2 tablets (650 mg total) by mouth every 6 (six) hours as needed.     ciprofloxacin 500 MG tablet  Commonly known as:  CIPRO  Take 1 tablet (500 mg total) by mouth 2 (two) times daily.     HYDROcodone-acetaminophen 5-325 MG per tablet  Commonly known as:  NORCO/VICODIN  Take 1 tablet by mouth every 6 (six) hours as needed.     metFORMIN 500 MG tablet  Commonly known as:  GLUCOPHAGE  Take 1,000 mg by mouth daily with breakfast.     metroNIDAZOLE 500 MG tablet  Commonly known as:  FLAGYL  Take 1 tablet (500 mg total) by mouth every 8 (eight) hours.     pravastatin 20 MG tablet  Commonly known as:  PRAVACHOL  Take 20 mg by mouth daily.     promethazine 12.5 MG tablet  Commonly known as:  PHENERGAN  Take 1 tablet (12.5 mg total) by mouth every 6 (six) hours as needed for nausea.  QUEtiapine 400 MG tablet  Commonly known as:  SEROQUEL  Take 800 mg by mouth at bedtime.     sertraline 100 MG tablet  Commonly known as:  ZOLOFT  Take 100 mg by mouth at bedtime.     thiamine 100 MG tablet  Commonly known as:  VITAMIN B-1  Take 100 mg by mouth daily.     zolpidem 6.25 MG CR tablet  Commonly known as:  AMBIEN CR  Take 6.25 mg by mouth at bedtime as needed. For sleep       No Known Allergies     Follow-up Information   Follow up with Dr. Mayford Knife In 2 weeks. (to check diabetes and monitor colitis symptoms)       Follow up with HAYES,JOHN C, MD. (If symptoms worsen)    Contact information:   [address]                         [place] [address] [phone]        The results of significant diagnostics from this hospitalization (including imaging, microbiology, ancillary and laboratory) are listed below for reference.    Significant Diagnostic Studies: Ct Abdomen Pelvis W Contrast  05/01/2013   *RADIOLOGY REPORT*  Clinical Data: Painless rectal bleeding.  CT ABDOMEN AND PELVIS WITH CONTRAST  Technique:  Multidetector CT imaging of the abdomen and pelvis was performed following the standard protocol during bolus administration of intravenous contrast.  Contrast: OMNIPAQUE IOHEXOL 300 MG/ML  SOLN  Comparison: None.  Findings:  BODY WALL: Unremarkable.  LOWER CHEST:  Mediastinum: Unremarkable.  Lungs/pleura: No consolidation.  ABDOMEN/PELVIS:  Liver: 3.4 cm mass in segment six.  On the portal venous phase there is peripheral nodular enhancement that increases on delayed imaging.  No additional focal abnormality detected.  Biliary: No evidence of biliary obstruction or stone.  Pancreas: Unremarkable.  Spleen: Unremarkable.  Adrenals: Unremarkable.  Kidneys and ureters: No hydronephrosis or stone. 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  Bladder: Unremarkable.  Bowel: Circumferential bowel wall thickening beginning at the splenic flexure and continuing to the rectosigmoid junction.  There is pericolonic fat stranding.  Although the extensive submucosal edema distorts anatomy, there appears to be mucosal and serosal enhancement along the affected segment.  The IMA and IMV are patent.  No bowel obstruction.  Normal appendix.  Retroperitoneum: No mass or adenopathy.  Peritoneum: No free fluid or gas.  Reproductive: Hysterectomy.  Vascular: Aortic atherosclerosis, most notably at the aortic hiatus.  OSSEOUS: No acute abnormalities. Remote inferior pubic ramus fractures bilaterally.  Advanced degenerative disc disease at L5- L1.  IMPRESSION: 1. Colitis of the descending and  sigmoid colon.  The extent favors infectious/inflammatory colitis over ischemic colitis.  2.  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  3.  Bilateral renal cortical scarring, pattern favoring prior vesicoureteral reflux.   Original Report Authenticated By: Tiburcio Pea    Microbiology: Recent Results (from the past 240 hour(s))  URINE CULTURE     Status: None   Collection Time    05/01/13  2:20 AM      Result Value Range Status   Specimen Description URINE, RANDOM   Final   Special Requests ADDED 0244   Final   Culture  Setup Time 05/01/2013 02:58   Final   Colony Count 80,000 COLONIES/ML   Final   Culture     Final  Value: Multiple bacterial morphotypes present, none predominant. Suggest appropriate recollection if clinically indicated.   Report Status 05/02/2013 FINAL   Final  STOOL CULTURE     Status: None   Collection Time    05/01/13  5:00 PM      Result Value Range Status   Specimen Description STOOL   Final   Special Requests NONE   Final   Culture NO GROWTH   Final   Report Status PENDING   Incomplete  CLOSTRIDIUM DIFFICILE BY PCR     Status: None   Collection Time    05/01/13  5:00 PM      Result Value Range Status   C difficile by pcr NEGATIVE  NEGATIVE Final     Labs: Basic Metabolic Panel:  Recent Labs Lab 04/30/13 2323 05/01/13 0848 05/02/13 0642  NA 132* 132* 134*  K 4.5 4.8 4.6  CL 97 98 101  CO2 18* 20 22  GLUCOSE 234* 209* 175*  BUN 37* 26* 15  CREATININE 1.11* 0.94 0.92  CALCIUM 10.4 9.7 9.2   Liver Function Tests:  Recent Labs Lab 04/30/13 2323 05/01/13 0848  AST 15 15  ALT 19 16  ALKPHOS 86 81  BILITOT 0.4 0.4  PROT 8.1 7.5  ALBUMIN 4.4 4.0   No results found for this basename: LIPASE, AMYLASE,  in the last 168 hours No results found for this basename: AMMONIA,  in the last 168 hours CBC:  Recent Labs Lab 04/30/13 2323 05/01/13 0848 05/02/13 0642  WBC 18.3* 18.5* 15.9*  NEUTROABS 14.7* 13.7*  --   HGB 14.1  13.5 13.1  HCT 40.1 38.0 37.4  MCV 89.5 89.4 90.6  PLT 281 237 242   Cardiac Enzymes: No results found for this basename: CKTOTAL, CKMB, CKMBINDEX, TROPONINI,  in the last 168 hours BNP: BNP (last 3 results) No results found for this basename: PROBNP,  in the last 8760 hours CBG:  Recent Labs Lab 05/02/13 0756 05/02/13 1225 05/02/13 1720 05/02/13 2111 05/03/13 0811  GLUCAP 163* 241* 118* 141* 149*    Signed:  Victoria Euceda L  [facility] 05/03/2013, 9:43 AM

## 2013-05-05 LAB — STOOL CULTURE

## 2013-05-20 ENCOUNTER — Observation Stay (HOSPITAL_COMMUNITY): Payer: Federal, State, Local not specified - PPO

## 2013-05-20 ENCOUNTER — Encounter (HOSPITAL_COMMUNITY): Payer: Self-pay | Admitting: Emergency Medicine

## 2013-05-20 ENCOUNTER — Observation Stay (HOSPITAL_COMMUNITY)
Admission: EM | Admit: 2013-05-20 | Discharge: 2013-05-21 | Disposition: A | Discharge status: Home / Self Care | Payer: Federal, State, Local not specified - PPO | Attending: Internal Medicine | Admitting: Internal Medicine

## 2013-05-20 ENCOUNTER — Emergency Department (HOSPITAL_COMMUNITY): Payer: Federal, State, Local not specified - PPO

## 2013-05-20 DIAGNOSIS — F329 Major depressive disorder, single episode, unspecified: Secondary | ICD-10-CM

## 2013-05-20 DIAGNOSIS — F17200 Nicotine dependence, unspecified, uncomplicated: Secondary | ICD-10-CM

## 2013-05-20 DIAGNOSIS — N958 Other specified menopausal and perimenopausal disorders: Secondary | ICD-10-CM | POA: Insufficient documentation

## 2013-05-20 DIAGNOSIS — M79609 Pain in unspecified limb: Secondary | ICD-10-CM | POA: Insufficient documentation

## 2013-05-20 DIAGNOSIS — F1210 Cannabis abuse, uncomplicated: Secondary | ICD-10-CM | POA: Insufficient documentation

## 2013-05-20 DIAGNOSIS — K529 Noninfective gastroenteritis and colitis, unspecified: Secondary | ICD-10-CM

## 2013-05-20 DIAGNOSIS — R930 Abnormal findings on diagnostic imaging of skull and head, not elsewhere classified: Secondary | ICD-10-CM | POA: Insufficient documentation

## 2013-05-20 DIAGNOSIS — N179 Acute kidney failure, unspecified: Secondary | ICD-10-CM

## 2013-05-20 DIAGNOSIS — D473 Essential (hemorrhagic) thrombocythemia: Secondary | ICD-10-CM | POA: Insufficient documentation

## 2013-05-20 DIAGNOSIS — E785 Hyperlipidemia, unspecified: Secondary | ICD-10-CM | POA: Diagnosis present

## 2013-05-20 DIAGNOSIS — I4510 Unspecified right bundle-branch block: Secondary | ICD-10-CM

## 2013-05-20 DIAGNOSIS — I6350 Cerebral infarction due to unspecified occlusion or stenosis of unspecified cerebral artery: Secondary | ICD-10-CM

## 2013-05-20 DIAGNOSIS — W19XXXA Unspecified fall, initial encounter: Secondary | ICD-10-CM | POA: Insufficient documentation

## 2013-05-20 DIAGNOSIS — E1165 Type 2 diabetes mellitus with hyperglycemia: Secondary | ICD-10-CM | POA: Diagnosis present

## 2013-05-20 DIAGNOSIS — R55 Syncope and collapse: Principal | ICD-10-CM

## 2013-05-20 DIAGNOSIS — F17213 Nicotine dependence, cigarettes, with withdrawal: Secondary | ICD-10-CM

## 2013-05-20 DIAGNOSIS — I639 Cerebral infarction, unspecified: Secondary | ICD-10-CM

## 2013-05-20 DIAGNOSIS — E860 Dehydration: Secondary | ICD-10-CM

## 2013-05-20 DIAGNOSIS — F1290 Cannabis use, unspecified, uncomplicated: Secondary | ICD-10-CM

## 2013-05-20 DIAGNOSIS — E119 Type 2 diabetes mellitus without complications: Secondary | ICD-10-CM

## 2013-05-20 DIAGNOSIS — Z79899 Other long term (current) drug therapy: Secondary | ICD-10-CM | POA: Insufficient documentation

## 2013-05-20 DIAGNOSIS — Z8673 Personal history of transient ischemic attack (TIA), and cerebral infarction without residual deficits: Secondary | ICD-10-CM | POA: Insufficient documentation

## 2013-05-20 DIAGNOSIS — N951 Menopausal and female climacteric states: Secondary | ICD-10-CM

## 2013-05-20 DIAGNOSIS — D649 Anemia, unspecified: Secondary | ICD-10-CM

## 2013-05-20 DIAGNOSIS — D75839 Thrombocytosis, unspecified: Secondary | ICD-10-CM

## 2013-05-20 HISTORY — DX: Bipolar disorder, unspecified: F31.9

## 2013-05-20 HISTORY — DX: Essential (primary) hypertension: I10

## 2013-05-20 LAB — POCT I-STAT TROPONIN I: Troponin i, poc: 0 ng/mL (ref 0.00–0.08)

## 2013-05-20 LAB — CREATININE, SERUM
Creatinine, Ser: 1.55 mg/dL — ABNORMAL HIGH (ref 0.50–1.10)
GFR calc Af Amer: 43 mL/min — ABNORMAL LOW (ref >90)
GFR calc non Af Amer: 37 mL/min — ABNORMAL LOW (ref >90)

## 2013-05-20 LAB — OCCULT BLOOD, POC DEVICE: Fecal Occult Bld: NEGATIVE

## 2013-05-20 LAB — CBC WITH DIFFERENTIAL/PLATELET
HCT: 35.6 % — ABNORMAL LOW (ref 36.0–46.0)
Hemoglobin: 11.8 g/dL — ABNORMAL LOW (ref 12.0–15.0)
Lymphocytes Relative: 37 % (ref 12–46)
Lymphs Abs: 2.8 10*3/uL (ref 0.7–4.0)
MCHC: 33.1 g/dL (ref 30.0–36.0)
Monocytes Absolute: 0.4 10*3/uL (ref 0.1–1.0)
Monocytes Relative: 5 % (ref 3–12)
Neutro Abs: 4.1 10*3/uL (ref 1.7–7.7)
WBC: 7.6 10*3/uL (ref 4.0–10.5)

## 2013-05-20 LAB — CBC
HCT: 34.3 % — ABNORMAL LOW (ref 36.0–46.0)
Hemoglobin: 11.9 g/dL — ABNORMAL LOW (ref 12.0–15.0)
MCHC: 34.7 g/dL (ref 30.0–36.0)
RDW: 14.1 % (ref 11.5–15.5)
WBC: 7.7 10*3/uL (ref 4.0–10.5)

## 2013-05-20 LAB — URINALYSIS, ROUTINE W REFLEX MICROSCOPIC
Glucose, UA: NEGATIVE mg/dL
Hgb urine dipstick: NEGATIVE
Leukocytes, UA: NEGATIVE
Protein, ur: 30 mg/dL — AB
pH: 5.5 (ref 5.0–8.0)

## 2013-05-20 LAB — COMPREHENSIVE METABOLIC PANEL
AST: 17 U/L (ref 0–37)
Albumin: 3.9 g/dL (ref 3.5–5.2)
Alkaline Phosphatase: 66 U/L (ref 39–117)
BUN: 46 mg/dL — ABNORMAL HIGH (ref 6–23)
Chloride: 101 mEq/L (ref 96–112)
Creatinine, Ser: 2.54 mg/dL — ABNORMAL HIGH (ref 0.50–1.10)
Potassium: 4.3 mEq/L (ref 3.5–5.1)
Total Bilirubin: 0.1 mg/dL — ABNORMAL LOW (ref 0.3–1.2)
Total Protein: 6.7 g/dL (ref 6.0–8.3)

## 2013-05-20 LAB — CREATININE, URINE, RANDOM: Creatinine, Urine: 275.13 mg/dL

## 2013-05-20 LAB — RAPID URINE DRUG SCREEN, HOSP PERFORMED
Cocaine: NOT DETECTED
Opiates: NOT DETECTED

## 2013-05-20 LAB — GLUCOSE, CAPILLARY
Glucose-Capillary: 176 mg/dL — ABNORMAL HIGH (ref 70–99)
Glucose-Capillary: 115 mg/dL — ABNORMAL HIGH (ref 70–99)

## 2013-05-20 LAB — URINE MICROSCOPIC-ADD ON

## 2013-05-20 MED ORDER — NICOTINE 21 MG/24HR TD PT24
21.0000 mg | MEDICATED_PATCH | Freq: Every day | TRANSDERMAL | Status: DC
  Administered 2013-05-20: 21 mg via TRANSDERMAL
  Filled 2013-05-20 (×2): qty 1

## 2013-05-20 MED ORDER — SODIUM CHLORIDE 0.9 % IJ SOLN
3.0000 mL | Freq: Two times a day (BID) | INTRAMUSCULAR | Status: DC
  Administered 2013-05-21: 3 mL via INTRAVENOUS

## 2013-05-20 MED ORDER — ACETAMINOPHEN 325 MG PO TABS
650.0000 mg | ORAL_TABLET | Freq: Four times a day (QID) | ORAL | Status: DC | PRN
  Administered 2013-05-20: 650 mg via ORAL
  Filled 2013-05-20: qty 2

## 2013-05-20 MED ORDER — VITAMIN B-1 100 MG PO TABS
100.0000 mg | ORAL_TABLET | Freq: Every day | ORAL | Status: DC
  Administered 2013-05-20 – 2013-05-21 (×2): 100 mg via ORAL
  Filled 2013-05-20 (×2): qty 1

## 2013-05-20 MED ORDER — ACETAMINOPHEN 650 MG RE SUPP: 650.0000 mg | Freq: Four times a day (QID) | RECTAL | Status: DC | PRN

## 2013-05-20 MED ORDER — TRAMADOL HCL 50 MG PO TABS
100.0000 mg | ORAL_TABLET | Freq: Two times a day (BID) | ORAL | Status: DC | PRN
  Administered 2013-05-20 – 2013-05-21 (×2): 100 mg via ORAL
  Filled 2013-05-20 (×2): qty 2

## 2013-05-20 MED ORDER — ONDANSETRON HCL 4 MG/2ML IJ SOLN: 4.0000 mg | Freq: Four times a day (QID) | INTRAMUSCULAR | Status: DC | PRN

## 2013-05-20 MED ORDER — SODIUM CHLORIDE 0.9 % IV SOLN
INTRAVENOUS | Status: DC
  Administered 2013-05-20 – 2013-05-21 (×2): via INTRAVENOUS

## 2013-05-20 MED ORDER — INSULIN ASPART 100 UNIT/ML ~~LOC~~ SOLN
0.0000 [IU] | Freq: Three times a day (TID) | SUBCUTANEOUS | Status: DC
  Administered 2013-05-21: 1 [IU] via SUBCUTANEOUS

## 2013-05-20 MED ORDER — ATORVASTATIN CALCIUM 20 MG PO TABS
20.0000 mg | ORAL_TABLET | Freq: Every day | ORAL | Status: DC
  Administered 2013-05-20: 20 mg via ORAL
  Filled 2013-05-20 (×2): qty 1

## 2013-05-20 MED ORDER — HEPARIN SODIUM (PORCINE) 5000 UNIT/ML IJ SOLN
5000.0000 [IU] | Freq: Three times a day (TID) | INTRAMUSCULAR | Status: DC
  Administered 2013-05-20 – 2013-05-21 (×3): 5000 [IU] via SUBCUTANEOUS
  Filled 2013-05-20 (×6): qty 1

## 2013-05-20 MED ORDER — QUETIAPINE FUMARATE 400 MG PO TABS
800.0000 mg | ORAL_TABLET | Freq: Every day | ORAL | Status: DC
  Administered 2013-05-20: 800 mg via ORAL
  Filled 2013-05-20 (×2): qty 2

## 2013-05-20 MED ORDER — OXYCODONE-ACETAMINOPHEN 5-325 MG PO TABS
1.0000 | ORAL_TABLET | Freq: Once | ORAL | Status: AC
  Administered 2013-05-20: 1 via ORAL
  Filled 2013-05-20: qty 1

## 2013-05-20 MED ORDER — SODIUM CHLORIDE 0.9 % IV BOLUS (SEPSIS)
1000.0000 mL | Freq: Once | INTRAVENOUS | Status: AC
  Administered 2013-05-20: 1000 mL via INTRAVENOUS

## 2013-05-20 MED ORDER — ONDANSETRON HCL 4 MG PO TABS: 4.0000 mg | ORAL_TABLET | Freq: Four times a day (QID) | ORAL | Status: DC | PRN

## 2013-05-20 NOTE — ED Notes (Signed)
Patient is resting comfortably. 

## 2013-05-20 NOTE — ED Notes (Signed)
MD at bedside. 

## 2013-05-20 NOTE — ED Notes (Addendum)
Passed out x 2 this am states has been  Going on since the last time she was here thinks it might be her meds she  States that she does not check her sugar at all just take her meds but did not take them this am having throat pain back of neck and  Rt lrg pain

## 2013-05-20 NOTE — ED Provider Notes (Signed)
CSN: [id number]     Arrival date & time 05/20/13  7829 History     First MD Initiated Contact with Patient 05/20/13 [phone]     No chief complaint on file.  (Consider location/radiation/quality/duration/timing/severity/associated sxs/prior Treatment) HPI Comments: 52 year old African American female presents emergency department with chief complaint of syncope. Patient reports 2 episodes of syncope this morning at approximately 7:00 AM. She passed out when walking to the bathroom, recovered, went to the bathroom, and passed out again when she walked from the bathroom to the kitchen. The patient's roommate called the patient's daughter who transported her to the emergency department. On arrival the patient was symptom free. She was alert and oriented in no acute distress. She denied current symptoms. Of note she denied headache, neck pain, chest pain, abdominal pain, some diarrhea, paralysis, paresthesias, neurologic symptoms, slurred speech, facial droop.  Patient denies any change in current medications. She does report being admitted July 26-29, 2014 for colitis.  Patient denies loose or bloody stools at this time.  Patient is a 52 y.o. female presenting with syncope.  Loss of Consciousness Episode history:  Multiple Most recent episode:  Today Duration:  1 minute Timing:  Intermittent Progression:  Resolved Chronicity:  Recurrent Context: not blood draw, not bowel movement, not dehydration, not exertion, not inactivity, not medication change, not with normal activity and not sight of blood   Witnessed: no   Relieved by:  Nothing Worsened by:  Nothing tried Ineffective treatments:  None tried Associated symptoms: shortness of breath   Associated symptoms: no chest pain, no confusion, no diaphoresis, no difficulty breathing, no dizziness, no fever, no headaches, no malaise/fatigue, no nausea, no palpitations, no recent fall and no seizures   Risk factors: no congenital heart disease, no  coronary artery disease, no seizures and no vascular disease     Past Medical History  Diagnosis Date  . Diabetes mellitus without complication   . GERD (gastroesophageal reflux disease)    Past Surgical History  Procedure Laterality Date  . Abdominal hysterectomy     Family History  Problem Relation Age of Onset  . Diabetes Mellitus II Father    History  Substance Use Topics  . Smoking status: Current Every Day Smoker  . Smokeless tobacco: Not on file  . Alcohol Use: No   OB History   Grav Para Term Preterm Abortions TAB SAB Ect Mult Living   4 3 2  1    1 4      Review of Systems  Constitutional: Negative for fever, malaise/fatigue, diaphoresis, activity change and appetite change.  HENT: Negative.   Respiratory: Positive for cough and shortness of breath. Negative for apnea, choking, chest tightness, wheezing and stridor.        Patient is a current smoker who reports chronic mild shortness of breath and smoker's cough. No new symptoms  Cardiovascular: Positive for syncope. Negative for chest pain, palpitations and leg swelling.  Gastrointestinal: Negative.  Negative for nausea.  Endocrine: Negative.   Genitourinary: Negative.   Neurological: Negative for dizziness, seizures and headaches.  Psychiatric/Behavioral: Negative for confusion.    Allergies  Review of patient's allergies indicates no known allergies.  Home Medications   Current Outpatient Rx  Name  Route  Sig  Dispense  Refill  . glimepiride (AMARYL) 1 MG tablet   Oral   Take 1 mg by mouth daily before breakfast.         . lisinopril (PRINIVIL,ZESTRIL) 20 MG tablet   Oral  Take 20 mg by mouth daily.         . metFORMIN (GLUCOPHAGE) 1000 MG tablet   Oral   Take 1,000 mg by mouth 2 (two) times daily with a meal.         . promethazine (PHENERGAN) 12.5 MG tablet   Oral   Take 1 tablet (12.5 mg total) by mouth every 6 (six) hours as needed for nausea.   15 tablet   0   . QUEtiapine  (SEROQUEL) 400 MG tablet   Oral   Take 800 mg by mouth at bedtime.         . simvastatin (ZOCOR) 20 MG tablet   Oral   Take 20 mg by mouth daily.         Marland Kitchen thiamine (VITAMIN B-1) 100 MG tablet   Oral   Take 100 mg by mouth daily.          BP 103/69  Pulse 89  Temp(Src) 98.1 F (36.7 C)  Resp 19  SpO2 100% Physical Exam  Constitutional: She is oriented to person, place, and time. She appears well-developed and well-nourished.  HENT:  Head: Normocephalic and atraumatic.  Eyes: Conjunctivae and EOM are normal. Pupils are equal, round, and reactive to light.  Neck: Normal range of motion. Neck supple.  Cardiovascular: Normal rate, regular rhythm and normal heart sounds.   Pulmonary/Chest: Effort normal and breath sounds normal. No respiratory distress. She has no wheezes. She has no rales. She exhibits no tenderness.  Abdominal: Soft. Bowel sounds are normal. She exhibits no distension and no mass. There is no tenderness. There is no rebound and no guarding.  Musculoskeletal: She exhibits no edema.  Neurological: She is alert and oriented to person, place, and time. She has normal reflexes. No cranial nerve deficit. Coordination normal.  Alert and oriented, normal mental status, normal speech, normal muscle strength, moving all extremities, DTRs two out of four, normal finger to nose, normal rapid alternating movements, negative pronator drift, negative facial droop  Skin: Skin is warm and dry.    ED Course   Procedures (including critical care time)  Labs Reviewed  GLUCOSE, CAPILLARY - Abnormal; Notable for the following:    Glucose-Capillary 176 (*)    All other components within normal limits  CBC WITH DIFFERENTIAL - Abnormal; Notable for the following:    Hemoglobin 11.8 (*)    HCT 35.6 (*)    Platelets 415 (*)    All other components within normal limits  COMPREHENSIVE METABOLIC PANEL - Abnormal; Notable for the following:    Glucose, Bld 230 (*)    BUN 46 (*)     Creatinine, Ser 2.54 (*)    Total Bilirubin 0.1 (*)    GFR calc non Af Amer 20 (*)    GFR calc Af Amer 24 (*)    All other components within normal limits  URINALYSIS, ROUTINE W REFLEX MICROSCOPIC - Abnormal; Notable for the following:    APPearance CLOUDY (*)    Bilirubin Urine SMALL (*)    Protein, ur 30 (*)    All other components within normal limits  URINE MICROSCOPIC-ADD ON - Abnormal; Notable for the following:    Squamous Epithelial / LPF MANY (*)    Bacteria, UA MANY (*)    All other components within normal limits  OCCULT BLOOD, POC DEVICE  POCT I-STAT TROPONIN I  POCT CBG (FASTING - GLUCOSE)-MANUAL ENTRY   Dg Chest 2 View  05/20/2013   *RADIOLOGY REPORT*  Clinical Data: Syncopal episode  CHEST - 2 VIEW  Comparison: 02/07/2013  Findings: The heart pulmonary vascularity are within normal limits. The lungs are clear bilaterally.  No acute bony abnormality is seen.  IMPRESSION: No acute abnormality noted.   Original Report Authenticated By: Alcide Clever, M.D.   Ct Head Wo Contrast  05/20/2013   *RADIOLOGY REPORT*  Clinical Data: Altered mental status, syncopal episode  CT HEAD WITHOUT CONTRAST  Technique:  Contiguous axial images were obtained from the base of the skull through the vertex without contrast.  Comparison: None.  Findings: Remote anterior left basal ganglia lacunar type infarct with adjacent ex vacuo dilatation of the left lateral ventricle frontal horn.  No acute intracranial hemorrhage, mass lesion, definite infarction, midline shift, herniation pneumocephalus, or extra-axial fluid collection.  Normal gray-white matter differentiation.  No cerebellar abnormality. Cisterns are patent. Orbits are unremarkable.  Mastoids are clear.  Sinuses are clear except for minimal left posterior ethmoid secretions.  No skull abnormality.  IMPRESSION: Remote left basal ganglia lacunar infarct.  No acute finding by noncontrast CT   Original Report Authenticated By: Judie Petit. Shick, M.D.   1.  Syncope   2. Renal insufficiency     Results for orders placed during the hospital encounter of 05/20/13  GLUCOSE, CAPILLARY      Result Value Range   Glucose-Capillary 176 (*) 70 - 99 mg/dL  CBC WITH DIFFERENTIAL      Result Value Range   WBC 7.6  4.0 - 10.5 K/uL   RBC 3.90  3.87 - 5.11 MIL/uL   Hemoglobin 11.8 (*) 12.0 - 15.0 g/dL   HCT 96.0 (*) 45.4 - 09.8 %   MCV 91.3  78.0 - 100.0 fL   MCH 30.3  26.0 - 34.0 pg   MCHC 33.1  30.0 - 36.0 g/dL   RDW 11.9  14.7 - 82.9 %   Platelets 415 (*) 150 - 400 K/uL   Neutrophils Relative % 54  43 - 77 %   Neutro Abs 4.1  1.7 - 7.7 K/uL   Lymphocytes Relative 37  12 - 46 %   Lymphs Abs 2.8  0.7 - 4.0 K/uL   Monocytes Relative 5  3 - 12 %   Monocytes Absolute 0.4  0.1 - 1.0 K/uL   Eosinophils Relative 4  0 - 5 %   Eosinophils Absolute 0.3  0.0 - 0.7 K/uL   Basophils Relative 0  0 - 1 %   Basophils Absolute 0.0  0.0 - 0.1 K/uL  COMPREHENSIVE METABOLIC PANEL      Result Value Range   Sodium 137  135 - 145 mEq/L   Potassium 4.3  3.5 - 5.1 mEq/L   Chloride 101  96 - 112 mEq/L   CO2 19  19 - 32 mEq/L   Glucose, Bld 230 (*) 70 - 99 mg/dL   BUN 46 (*) 6 - 23 mg/dL   Creatinine, Ser 5.62 (*) 0.50 - 1.10 mg/dL   Calcium 9.9  8.4 - 13.0 mg/dL   Total Protein 6.7  6.0 - 8.3 g/dL   Albumin 3.9  3.5 - 5.2 g/dL   AST 17  0 - 37 U/L   ALT 26  0 - 35 U/L   Alkaline Phosphatase 66  39 - 117 U/L   Total Bilirubin 0.1 (*) 0.3 - 1.2 mg/dL   GFR calc non Af Amer 20 (*) >90 mL/min   GFR calc Af Amer 24 (*) >90 mL/min  URINALYSIS, ROUTINE W REFLEX  MICROSCOPIC      Result Value Range   Color, Urine YELLOW  YELLOW   APPearance CLOUDY (*) CLEAR   Specific Gravity, Urine 1.024  1.005 - 1.030   pH 5.5  5.0 - 8.0   Glucose, UA NEGATIVE  NEGATIVE mg/dL   Hgb urine dipstick NEGATIVE  NEGATIVE   Bilirubin Urine SMALL (*) NEGATIVE   Ketones, ur NEGATIVE  NEGATIVE mg/dL   Protein, ur 30 (*) NEGATIVE mg/dL   Urobilinogen, UA 0.2  0.0 - 1.0 mg/dL   Nitrite  NEGATIVE  NEGATIVE   Leukocytes, UA NEGATIVE  NEGATIVE  URINE MICROSCOPIC-ADD ON      Result Value Range   Squamous Epithelial / LPF MANY (*) RARE   WBC, UA 0-2  <3 WBC/hpf   Bacteria, UA MANY (*) RARE  OCCULT BLOOD, POC DEVICE      Result Value Range   Fecal Occult Bld NEGATIVE  NEGATIVE  POCT I-STAT TROPONIN I      Result Value Range   Troponin i, poc 0.00  0.00 - 0.08 ng/mL   Comment 3            Dg Chest 2 View  05/20/2013   *RADIOLOGY REPORT*  Clinical Data: Syncopal episode  CHEST - 2 VIEW  Comparison: 02/07/2013  Findings: The heart pulmonary vascularity are within normal limits. The lungs are clear bilaterally.  No acute bony abnormality is seen.  IMPRESSION: No acute abnormality noted.   Original Report Authenticated By: Alcide Clever, M.D.   Ct Head Wo Contrast  05/20/2013   *RADIOLOGY REPORT*  Clinical Data: Altered mental status, syncopal episode  CT HEAD WITHOUT CONTRAST  Technique:  Contiguous axial images were obtained from the base of the skull through the vertex without contrast.  Comparison: None.  Findings: Remote anterior left basal ganglia lacunar type infarct with adjacent ex vacuo dilatation of the left lateral ventricle frontal horn.  No acute intracranial hemorrhage, mass lesion, definite infarction, midline shift, herniation pneumocephalus, or extra-axial fluid collection.  Normal gray-white matter differentiation.  No cerebellar abnormality. Cisterns are patent. Orbits are unremarkable.  Mastoids are clear.  Sinuses are clear except for minimal left posterior ethmoid secretions.  No skull abnormality.  IMPRESSION: Remote left basal ganglia lacunar infarct.  No acute finding by noncontrast CT   Original Report Authenticated By: Judie Petit. Miles Costain, M.D.    Date: 05/20/2013  Rate: 84  Rhythm: normal sinus rhythm  QRS Axis: normal  Intervals: normal  ST/T Wave abnormalities: normal  Conduction Disutrbances:right bundle branch block  Narrative Interpretation:   Old EKG  Reviewed: none available   Your workup reveals new acute renal insufficiency with creatinine of 2.5. Plan consult inpatient team for evaluation for new onset syncope with acute renal insufficiency.  1:37 PM Vital signs stable and patient no acute distress.  MDM  52 year old female presents emergency department with chief complaint of syncope x2 this morning. Patient has a history of multiple syncopal events over the past several months. Plan to obtain CT head, chest x-ray, EKG, troponin, and baseline lab work. Vital signs are normal the patient is no acute distress.  Case discussed with internal medicine Dr. Ronne Binning. In light of increasing bouts of syncope and acute renal insufficiency plan to admit for observation and further management. No issues during ER stay.  Darlys Gales, MD 05/20/13 [phone]

## 2013-05-20 NOTE — H&P (Signed)
[facility] History and Physical  SHATIMA ZALAR ZOX:[id number] DOB: 01/22/1961 DOA: 05/20/2013  Referring physician:  Darlys Gales PCP:  Pcp Not In System   Chief Complaint:  Recurrent syncope, AKI  HPI:  The patient is a 52 y.o. year-old female with history of T2DM, HLD, HTN, tobacco and marijuana use who presents with recurrent syncope and acute kidney injury.  The patient was last at their baseline health until a few weeks ago when she developed abdominal pain and diarrhea.  She was admitted from July 26 to the 29th with presumed infectious colitis and started on ciprofloxacin and Flagyl which she has completed. She states that her abdominal pain and diarrhea have resolved and she is now having regular soft bowel movements. She states she started having episodes of syncope approximately one year ago. The episodes previously occurred every 2-3 weeks and now have been occurring several times per day over the last 1-2 weeks. She states they have been generally as she is getting up from a seated position.   She states she has a sudden flash of light and immediately passes out. She does not remember falling and wakes up on the floor. She has occasionally lost control of her bladder but has not lost control of her bowels. She has not had any known witnessed events and therefore does not know if she has had seizure activity, however she wakes up completely immediately after the event.  She thinks LOC lasts seconds.  She may have hit her head several times, she is unsure, but denies focal neck or head pains.  She came to the emergency department today because she had 2 back-to-back episodes of syncope. She denies any slurred speech, confusion, focal weakness or numbness. She states she has been eating and drinking fluid, however she has been having soft frequent bowel movements without blood or mucous. She has been having a lot of hot flashes over the last several months and sometimes her syncope is  associated with a hot flash.  Denies recent NSAID use, but has been taking her medications.    In the emergency department, her labs were notable for hemoglobin 11.8, platelets 4:15, BUN 46, creatinine 2.54, and bicarbonate 19, potassium 4.3, troponin 0.00, occult stool neg. CT of the head demonstrated no acute findings but a remote left basal ganglia lacunar infarct.  Chest x-ray was unremarkable. Urinalysis was notable for specific gravity of 1.024, but not consistent with infection.  She was given a normal saline bolus and is being admitted for her acute kidney injury, dehydration, and recurrent syncope.   Review of Systems:  Denies fevers, chills, weight loss or gain, changes to hearing and vision. Frequent hot flashes.  Denies rhinorrhea, sinus congestion, sore throat.  Denies chest pain and palpitations.  Denies SOB, wheezing, cough.  Denies nausea, vomiting, constipation, diarrhea.  Denies dysuria, frequency, urgency, polyuria, polydipsia.  Denies hematemesis, blood in stools, melena, abnormal bruising or bleeding.  Denies lymphadenopathy.  Denies arthralgias, myalgias.  Denies skin rash or ulcer.  Denies lower extremity edema.  Denies focal numbness, weakness, slurred speech, confusion, facial droop.  Denies anxiety and depression.    Past Medical History  Diagnosis Date  . Diabetes mellitus without complication   . GERD (gastroesophageal reflux disease)   . HTN (hypertension)   . Bipolar 1 disorder    Past Surgical History  Procedure Laterality Date  . Abdominal hysterectomy     Social History:  reports that she has been smoking Cigarettes.  She has  a 33 pack-year smoking history. She does not have any smokeless tobacco history on file. She reports that she uses illicit drugs (Marijuana). She reports that she does not drink alcohol. Lives alone in an apartment.  Drives.  Does not work, disabled due to bipolar.   No Known Allergies  Family History  Problem Relation Age of Onset  .  Diabetes Mellitus II Father   . Diabetes Brother   . Heart disease Neg Hx   . Stroke Neg Hx   . Kidney disease Neg Hx      Prior to Admission medications   Medication Sig Start Date End Date Taking? Authorizing Provider  glimepiride (AMARYL) 1 MG tablet Take 1 mg by mouth daily before breakfast.   Yes Historical Provider, MD  lisinopril (PRINIVIL,ZESTRIL) 20 MG tablet Take 20 mg by mouth daily.   Yes Historical Provider, MD  metFORMIN (GLUCOPHAGE) 500 MG tablet Take 500 mg by mouth 2 (two) times daily with a meal.   Yes Historical Provider, MD  promethazine (PHENERGAN) 12.5 MG tablet Take 1 tablet (12.5 mg total) by mouth every 6 (six) hours as needed for nausea. 05/03/13  Yes Christiane Ha, MD  QUEtiapine (SEROQUEL) 400 MG tablet Take 800 mg by mouth at bedtime.   Yes Historical Provider, MD  simvastatin (ZOCOR) 20 MG tablet Take 20 mg by mouth daily.   Yes Historical Provider, MD   Physical Exam: Filed Vitals:   05/20/13 1230 05/20/13 1245 05/20/13 1300 05/20/13 1315  BP: 98/67 107/64 100/73 102/63  Pulse:  25    Temp:      Resp: 17 16 14 13   SpO2:  100%       General:  Average American female, no acute distress, had hot flash during my visit  Eyes:  PERRL, anicteric, non-injected.  ENT:  Nares clear.  OP clear, non-erythematous without plaques or exudates.  MMM.  Neck:  Supple without TM or JVD.    Lymph:  No cervical, supraclavicular, or submandibular LAD.  Cardiovascular:  RRR, normal S1, S2, without m/r/g.  2+ pulses, warm extremities  Respiratory:  CTA bilaterally without increased WOB.  Abdomen:  NABS.  Soft, ND/NT.    Skin:  No rashes or focal lesions.  Musculoskeletal:  Normal bulk and tone.  No LE edema.  Psychiatric:  A & O x 4.  Appropriate affect.  Neurologic:  CN 3-12 intact.  5/5 strength.  Sensation intact.  Gait is symmetric and stable.    Labs on Admission:  Basic Metabolic Panel:  Recent Labs Lab 05/20/13 0904  NA 137  K 4.3  CL 101   CO2 19  GLUCOSE 230*  BUN 46*  CREATININE 2.54*  CALCIUM 9.9   Liver Function Tests:  Recent Labs Lab 05/20/13 0904  AST 17  ALT 26  ALKPHOS 66  BILITOT 0.1*  PROT 6.7  ALBUMIN 3.9   No results found for this basename: LIPASE, AMYLASE,  in the last 168 hours No results found for this basename: AMMONIA,  in the last 168 hours CBC:  Recent Labs Lab 05/20/13 0904  WBC 7.6  NEUTROABS 4.1  HGB 11.8*  HCT 35.6*  MCV 91.3  PLT 415*   Cardiac Enzymes: No results found for this basename: CKTOTAL, CKMB, CKMBINDEX, TROPONINI,  in the last 168 hours  BNP (last 3 results) No results found for this basename: PROBNP,  in the last 8760 hours CBG:  Recent Labs Lab 05/20/13 0842  GLUCAP 176*    Radiological Exams  on Admission: Dg Chest 2 View  05/20/2013   *RADIOLOGY REPORT*  Clinical Data: Syncopal episode  CHEST - 2 VIEW  Comparison: 02/07/2013  Findings: The heart pulmonary vascularity are within normal limits. The lungs are clear bilaterally.  No acute bony abnormality is seen.  IMPRESSION: No acute abnormality noted.   Original Report Authenticated By: Alcide Clever, M.D.   Ct Head Wo Contrast  05/20/2013   *RADIOLOGY REPORT*  Clinical Data: Altered mental status, syncopal episode  CT HEAD WITHOUT CONTRAST  Technique:  Contiguous axial images were obtained from the base of the skull through the vertex without contrast.  Comparison: None.  Findings: Remote anterior left basal ganglia lacunar type infarct with adjacent ex vacuo dilatation of the left lateral ventricle frontal horn.  No acute intracranial hemorrhage, mass lesion, definite infarction, midline shift, herniation pneumocephalus, or extra-axial fluid collection.  Normal gray-white matter differentiation.  No cerebellar abnormality. Cisterns are patent. Orbits are unremarkable.  Mastoids are clear.  Sinuses are clear except for minimal left posterior ethmoid secretions.  No skull abnormality.  IMPRESSION: Remote left  basal ganglia lacunar infarct.  No acute finding by noncontrast CT   Original Report Authenticated By: Judie Petit. Shick, M.D.    EKG:  NSR with RBBB.    Assessment/Plan Principal Problem:   Syncope and collapse Active Problems:   Diabetes mellitus   Hyperlipidemia   CVA (cerebral infarction), anteriori left basal ganglia lacunar infarct   Marijuana smoker   Cigarette nicotine dependence with withdrawal   Acute kidney injury   Dehydration   Hot flashes, menopausal   RBBB   Normocytic anemia   Thrombocytosis   Syncope and collapse sounds suggestive of orthostatic hypotension or autonomic instability, however, cannot exclude possibility of TIA given history or previous CVA demonstrated on CT.   -  Telemetry -  IVF -  Repeat orthostatics in AM -  MRI brain -  ECHO -  Carotid duplex -  PT eval -  Risk factor modification:  A1c, lipid  Hypotension with hx of HTN, likely due to hypovolemia/dehydration -  IVF as above -  Hold BP medications  HLD, patient requests her simvastatin be changed back to crestor at discharge please.   AKI, likely prerenal -  Minimize nephrotoxins -  Renally dose medications -  Hold ACEI  Normocytic anemia, occult negative -  May be marrow suppression from recent GI illness -  Repeat CBC in AM  Thrombocytosis, reactive from recent colitis or dehydration -  Repeat CBC in AM  Cigarette dependence with withdrawal -  Nicotine patch -  Nurse to provide tobacco cessation counseling -  Told her that she would save around $5500 per year if she gave up smoking cigarettes and pot.  Marijuana use -  UDS -  SW consult  Diabetes -  A1c -  SSI -  Hold home medications  Diet:  diabetic Access:  PIV IVF:  NS  Proph:  heparin  Code Status: full Family Communication: patient alone Disposition Plan: observation telemetry  Time spent: 60 min  Cinthya Bors [facility] Pager [phone]  If 7PM-7AM, please contact  night-coverage [email or web address] Password [facility] 05/20/2013, 2:49 PM

## 2013-05-20 NOTE — Progress Notes (Signed)
  Echocardiogram 2D Echocardiogram has been performed.  [place]ian Co 05/20/2013, 6:03 PM

## 2013-05-20 NOTE — Evaluation (Signed)
Physical Therapy Evaluation Patient Details Name: Ashley [name] MRN: [id number] DOB: Mar 30, 1961 Today's Date: 05/20/2013 Time: 0865-7846 PT Time Calculation (min): 24 min  PT Assessment / Plan / Recommendation History of Present Illness  52 y.o. female admitted to [facility] on 05/20/13 with two back to back episodes of syncope.    Clinical Impression  The pt is most limited at this point by her left knee pain and not any current sensations of lightheadedness.  Orthostatic vitals were taken and are as follows: Supine: 108/77, sitting: 108/80, standing: 95/61.  Cane provided for support with gait with staff.  I believe once source of syncope addressed pt will likely only need cane until knee pain is better.  No f/u PT recommended, but I did recommend cane for home use.      PT Assessment  Patient needs continued PT services    Follow Up Recommendations  No PT follow up;Supervision - Intermittent    Does the patient have the potential to tolerate intense rehabilitation     NA  Barriers to Discharge Inaccessible home environment lives on second story apartment.  Before discharge we need to make sure she can go up and down the stairs with a cane safely.      Nurse, learning disability    Recommendations for Other Services   NA  Frequency Min 3X/week    Precautions / Restrictions Precautions Precautions: Fall Precaution Comments: monitory BP Restrictions Weight Bearing Restrictions: No   Pertinent Vitals/Pain See orthostatics above and See vitals flow sheet.      Mobility  Bed Mobility Bed Mobility: Supine to Sit;Sitting - Scoot to Edge of Bed;Sit to Supine Supine to Sit: 6: Modified independent (Device/Increase time);With rails;HOB elevated Sitting - Scoot to Edge of Bed: 6: Modified independent (Device/Increase time);With rail Sit to Supine: 4: Min assist;With rail;HOB elevated Details for Bed Mobility Assistance: heavy reliance on railing for support to get into and out of  bed.  Min assist of left leg when getting back into bed.   Transfers Transfers: Sit to Stand;Stand to Sit Sit to Stand: 4: Min guard;From elevated surface;With upper extremity assist;With armrests;From bed Stand to Sit: 4: Min guard;With upper extremity assist;With armrests;To bed Details for Transfer Assistance: min guard assist for safety due to history and left knee pain.   Ambulation/Gait Ambulation/Gait Assistance: 4: Min assist Ambulation Distance (Feet): 15 Feet Assistive device: 1 person hand held assist;Other (Comment) (IV pole) Ambulation/Gait Assistance Details: min assist due to painful gait pattern.  No reports of lightheadedness and no signs of syncope while walking to and from the bathroom.   Gait Pattern: Step-through pattern;Antalgic General Gait Details: cane provided at end of session so that pt will have something to use while she is here to steady herself while walking with staff.          PT Diagnosis: Difficulty walking;Abnormality of gait;Generalized weakness;Acute pain  PT Problem List: Decreased strength;Decreased activity tolerance;Decreased balance;Decreased mobility;Decreased knowledge of use of DME;Pain PT Treatment Interventions: DME instruction;Gait training;Stair training;Functional mobility training;Therapeutic activities;Therapeutic exercise;Balance training;Neuromuscular re-education;Patient/family education;Modalities     PT Goals(Current goals can be found in the care plan section) Acute Rehab PT Goals Patient Stated Goal: to get her knee checked out and figure out why she is passing out all the time.  PT Goal Formulation: With patient Time For Goal Achievement: 06/03/13 Potential to Achieve Goals: Good  Visit Information  Last PT Received On: 05/20/13 Assistance Needed: +1 History of Present Illness: 51 y.o. female admitted  to [facility] on 05/20/13 with two back to back episodes of syncope.         Prior Functioning  Home Living Family/patient  expects to be discharged to:: Private residence Living Arrangements: Spouse/significant other;Children Available Help at Discharge: Family;Available PRN/intermittently Type of Home: Apartment Home Access: Stairs to enter Entrance Stairs-Number of Steps: 14 Entrance Stairs-Rails: Right Home Layout: One level Home Equipment: None Prior Function Level of Independence: Independent Comments: with history of multiple falls due to syncope Communication Communication: No difficulties    Cognition  Cognition Arousal/Alertness: Awake/alert Behavior During Therapy: WFL for tasks assessed/performed Overall Cognitive Status: Within Functional Limits for tasks assessed    Extremity/Trunk Assessment Upper Extremity Assessment Upper Extremity Assessment: Overall WFL for tasks assessed Lower Extremity Assessment Lower Extremity Assessment: LLE deficits/detail LLE Deficits / Details: left leg weak due to pain in left knee.  Pt reports she thinks she hit left knee when she fell during one of her sycopal episodes.  No signs of bruising or swelling, tender to palpate medially.   Cervical / Trunk Assessment Cervical / Trunk Assessment: Normal   Balance Balance Balance Assessed: Yes Static Sitting Balance Static Sitting - Balance Support: No upper extremity supported;Feet supported Static Sitting - Level of Assistance: 7: Independent Dynamic Standing Balance Dynamic Standing - Balance Support: Right upper extremity supported;Left upper extremity supported Dynamic Standing - Level of Assistance: 4: Min assist  End of Session PT - End of Session Activity Tolerance: Patient limited by pain Patient left: in bed;with call bell/phone within reach;with bed alarm set Nurse Communication: Mobility status;Other (comment) (pain in left knee)    Donte Kary B. Gradyn Shein, PT, DPT [phone]   05/20/2013, 5:03 PM

## 2013-05-20 NOTE — ED Notes (Signed)
Patient transported to X-ray 

## 2013-05-21 DIAGNOSIS — R55 Syncope and collapse: Secondary | ICD-10-CM

## 2013-05-21 LAB — GLUCOSE, CAPILLARY
Glucose-Capillary: 61 mg/dL — ABNORMAL LOW (ref 70–99)
Glucose-Capillary: 92 mg/dL (ref 70–99)

## 2013-05-21 LAB — IRON AND TIBC
Saturation Ratios: 28 % (ref 20–55)
UIBC: 217 ug/dL (ref 125–400)

## 2013-05-21 LAB — TRANSFERRIN: Transferrin: 216 mg/dL (ref 200–360)

## 2013-05-21 LAB — VITAMIN B12: Vitamin B-12: 555 pg/mL (ref 211–911)

## 2013-05-21 MED ORDER — ENSURE COMPLETE PO LIQD: 237.0000 mL | Freq: Two times a day (BID) | ORAL | Status: DC

## 2013-05-21 MED ORDER — ROSUVASTATIN CALCIUM 10 MG PO TABS: 10.0000 mg | ORAL_TABLET | Freq: Every day | ORAL | Status: DC

## 2013-05-21 MED ORDER — TRAMADOL HCL 50 MG PO TABS: 100.0000 mg | ORAL_TABLET | Freq: Two times a day (BID) | ORAL | Status: DC | PRN

## 2013-05-21 MED ORDER — GLUCERNA SHAKE PO LIQD: 237.0000 mL | Freq: Three times a day (TID) | ORAL | Status: DC

## 2013-05-21 NOTE — Progress Notes (Signed)
Pt. Initial assessement complained of severe pain lt. Lower leg, starting lt. Knee radiates down to the ankle and foot. Tender to touch lt. Lateral knee, foot slightly cool per pt. Its always cool. With palpable pedal pulse, K. Schorr was paged x2  awaiting for on call MD to call back. Given tylenol for now since tramadol not yet due.

## 2013-05-21 NOTE — Progress Notes (Signed)
K. SchorrNP called back and made aware of pts. symptoms and pain in lt. lower leg. With orders made, percocet was given prior to MRI and x-ray of lt. Leg.  Will cont. to monitor.

## 2013-05-21 NOTE — Discharge Summary (Signed)
Physician Discharge Summary  Ashley [name] UXL:[id number] DOB: 25-Jan-1961 DOA: 05/20/2013  PCP: Pcp Not In System  Admit date: 05/20/2013 Discharge date: 05/21/2013  Recommendations for Outpatient Follow-up:  1. Follow up with primary care doctor in 1 week for repeat BMP and CBC.   Follow up anemia panel including iron studies, tsh, b12, folate.  Follow up pending cortisol level.   Address diabetes as outpatient.    Discharge Diagnoses:  Principal Problem:   Syncope and collapse Active Problems:   Diabetes mellitus   Hyperlipidemia   CVA (cerebral infarction), anteriori left basal ganglia lacunar infarct   Marijuana smoker   Cigarette nicotine dependence with withdrawal   Acute kidney injury   Dehydration   Hot flashes, menopausal   RBBB   Normocytic anemia   Thrombocytosis   Discharge Condition: stable, improved  Diet recommendation:  Diabetic diet with minimum of 2L fluids daily.  Wt Readings from Last 3 Encounters:  05/01/13 78.3 kg (172 lb 9.9 oz)  08/12/12 82.101 kg (181 lb)    History of present illness:   The patient is a 52 y.o. year-old female with history of T2DM, HLD, HTN, tobacco and marijuana use who presents with recurrent syncope and acute kidney injury. The patient was last at their baseline health until a few weeks ago when she developed abdominal pain and diarrhea. She was admitted from July 26 to the 29th with presumed infectious colitis and started on ciprofloxacin and Flagyl which she has completed. She states that her abdominal pain and diarrhea have resolved and she is now having regular soft bowel movements. She states she started having episodes of syncope approximately one year ago. The episodes previously occurred every 2-3 weeks and now have been occurring several times per day over the last 1-2 weeks. She states they have been generally as she is getting up from a seated position. She states she has a sudden flash of light and immediately passes out.  She does not remember falling and wakes up on the floor. She has occasionally lost control of her bladder but has not lost control of her bowels. She has not had any known witnessed events and therefore does not know if she has had seizure activity, however she wakes up completely immediately after the event. She thinks LOC lasts seconds. She may have hit her head several times, she is unsure, but denies focal neck or head pains. She came to the emergency department today because she had 2 back-to-back episodes of syncope. She denies any slurred speech, confusion, focal weakness or numbness. She states she has been eating and drinking fluid, however she has been having soft frequent bowel movements without blood or mucous. She has been having a lot of hot flashes over the last several months and sometimes her syncope is associated with a hot flash. Denies recent NSAID use, but has been taking her medications.   In the emergency department, her labs were notable for hemoglobin 11.8, platelets 4:15, BUN 46, creatinine 2.54, and bicarbonate 19, potassium 4.3, troponin 0.00, occult stool neg. CT of the head demonstrated no acute findings but a remote left basal ganglia lacunar infarct. Chest x-ray was unremarkable. Urinalysis was notable for specific gravity of 1.024, but not consistent with infection. She was given a normal saline bolus and is being admitted for her acute kidney injury, dehydration, and recurrent syncope.    Hospital Course:   Syncope and collapse sounds suggestive of orthostatic hypotension or autonomic instability, however, cannot exclude possibility of  TIA given history or previous CVA demonstrated on CT.  Telemetry demonstrated normal sinus rhythm without arrhythmias.  Echocardiogram demonstrated normal valves, ejection fraction preserved, grade 1 diastolic dysfunction. MRI of the brain demonstrated an old left basal ganglia infarct, but no acute intracranial abnormalities.  MRA demonstrated  no significant arterial stenosis. Carotid duplex demonstrated less than 39% stenosis bilaterally and antegrade vertebral artery flow.  Physical therapy evaluated the patient and recommended a cane but no further physical therapy followup.  Her orthostatics initially were almost positive and she continued IV fluids overnight.  Hypotension with hx of HTN, likely due to hypovolemia/dehydration.  Given IV fluids and her blood pressure improved.  Cortisol level is pending at the time of discharge.  HLD, will change patient simvastatin to Crestor per her request.   AKI, likely prerenal and creatinine trended down. She should not take her ACE inhibitor Intal instructed by her primary care doctor. She should have a followup BMP done in one week to ensure that her creatinine has returned to baseline. She should drink plenty of fluids, a minimum of 2 L per day she follows up with her doctor.  Normocytic anemia, occult negative.  May be marrow suppression from recent GI illness, but needs close followup.  Anemia panel is pending. Primary care doctor to followup on final results.     Thrombocytosis, reactive from recent colitis or dehydration, trended down.  Repeat CBC in one week as an outpatient.  Cigarette dependence with withdrawal.  Nicotine patch and counseled cessation.  Told her that she would save around $5500 per year if she gave up smoking cigarettes and pot.   Marijuana use.  Urine drug screen positive for THC.  Diabetes.  Low fingersticks overnight.  Continue metformin, but stop SU.    Left lower extremity pain, likely a ligamental tear or knee sprain. X-rays of the hips, knees, ankle were all negative for fracture.  Physical therapy recommending a cane.   If symptoms persist, consider orthopedics referral.   Procedures:   chest x-ray    CT of the head  MRA of the brain  MRI of the brain  X-ray of the left hip, and knee, tibia and fibula  Carotid  duplex  Echo  Consultations:  None  Discharge Exam: Filed Vitals:   05/21/13 1419  BP: 117/80  Pulse: 87  Temp: 98.4 F (36.9 C)  Resp: 18   Filed Vitals:   05/20/13 1630 05/20/13 2151 05/21/13 0434 05/21/13 1419  BP: 108/77 111/72 104/69 117/80  Pulse:  54 90 87  Temp:   99 F (37.2 C) 98.4 F (36.9 C)  TempSrc:   Oral Oral  Resp:  20 18 18   SpO2:  97% 100% 100%    General:  American female, no acute distress, sitting up in bed smiling and pleasant  HEENT: Normocephalic atraumatic, moist mucous membranes  Cardiovascular: Regular rate and rhythm, no murmurs rubs or gallops, 2+ pulses Respiratory:  chest clear to auscultation bilaterally abdomen: Normal active bowel sounds, soft, nondistended, nontender MSK:  Tenderness to palpation along the medial joint line of the left knee, full range of motion, no lower extremity swelling.    Discharge Instructions      Discharge Orders   Future Orders Complete By Expires   Call MD for:  difficulty breathing, headache or visual disturbances  As directed    Call MD for:  extreme fatigue  As directed    Call MD for:  hives  As directed  Call MD for:  persistant dizziness or light-headedness  As directed    Call MD for:  persistant nausea and vomiting  As directed    Call MD for:  severe uncontrolled pain  As directed    Call MD for:  temperature >100.4  As directed    Diet Carb Modified  As directed    Discharge instructions  As directed    Comments:     You were hospitalized with passing out spells which may be due to orthostatic hypotension and dehydration.  You have not had recent strokes, do not have narrowing of the blood vessels to the brain.  You do not have heart rhythm or structure problems, however, you have some mild stiffness of the heart called diastolic heart failure grade 1 (mild and very common).  Your kidney function and blood pressure have improved with fluids, so please continue to drink at least 2 liters of  fluid per day and follow up with your primary care doctor later this week for repeat bloodwork to check your kidney function.  Also you have some bloodwork which is pending at the time of discharge (causes of mild anemia and low blood pressure).  Please have your doctor call the hospital to get these test results.  If abnormal or you need a new prescription, I will also attempt to reach you.  You had some low blood sugars.  Please continue your metformin, but stop your other diabetes medication.  Talk to your primary care doctor about when to restart this medication.   Increase activity slowly  As directed        Medication List    STOP taking these medications       glimepiride 1 MG tablet  Commonly known as:  AMARYL     simvastatin 20 MG tablet  Commonly known as:  ZOCOR     thiamine 100 MG tablet  Commonly known as:  VITAMIN B-1      TAKE these medications       lisinopril 20 MG tablet  Commonly known as:  PRINIVIL,ZESTRIL  Take 20 mg by mouth daily.     metFORMIN 500 MG tablet  Commonly known as:  GLUCOPHAGE  Take 500 mg by mouth 2 (two) times daily with a meal.     promethazine 12.5 MG tablet  Commonly known as:  PHENERGAN  Take 1 tablet (12.5 mg total) by mouth every 6 (six) hours as needed for nausea.     QUEtiapine 400 MG tablet  Commonly known as:  SEROQUEL  Take 800 mg by mouth at bedtime.     rosuvastatin 10 MG tablet  Commonly known as:  CRESTOR  Take 1 tablet (10 mg total) by mouth daily.     traMADol 50 MG tablet  Commonly known as:  ULTRAM  Take 2 tablets (100 mg total) by mouth every 12 (twelve) hours as needed.       Follow-up Information   Follow up with Pcp Not In System. Schedule an appointment as soon as possible for a visit in 1 week.      The results of significant diagnostics from this hospitalization (including imaging, microbiology, ancillary and laboratory) are listed below for reference.    Significant Diagnostic Studies: Dg Chest 2  View  05/20/2013   *RADIOLOGY REPORT*  Clinical Data: Syncopal episode  CHEST - 2 VIEW  Comparison: 02/07/2013  Findings: The heart pulmonary vascularity are within normal limits. The lungs are clear bilaterally.  No acute bony  abnormality is seen.  IMPRESSION: No acute abnormality noted.   Original Report Authenticated By: Alcide Clever, M.D.   Dg Hip Complete Left  05/20/2013   *RADIOLOGY REPORT*  Clinical Data: 52 year old female status post fall with left side pain.  LEFT HIP - COMPLETE 2+ VIEW  Comparison: CT abdomen and pelvis 05/01/2013.  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 Femoral heads normally located.  Pelvis appears stable and intact. Proximal left femur intact.  IMPRESSION: No acute fracture or dislocation identified about the left hip or pelvis.  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   Original Report Authenticated By: Erskine Speed, M.D.   Dg Tibia/fibula Left  05/21/2013   *RADIOLOGY REPORT*  Clinical Data: Fall, left lower extremity pain  LEFT TIBIA AND FIBULA - 2 VIEW  Comparison: Concurrently obtained radiographs of the knee  Findings: Frontal lateral views of the lower leg demonstrate no acute fracture or malalignment.  Well corticated ossicle inferior to the tip of the lateral malleolus likely reflects the sequela of remote trauma.  There is a small well circumscribed lytic lesion with a peripheral calcification within the medullary space of the distal femoral diaphysis.  This likely reflects an intraosseous lipoma or other benign fibro osseous lesion.  Incidental note is made of mild midfoot osteoarthritis with spurring along the talar neck.  IMPRESSION: No acute fracture or malalignment.   Original Report Authenticated By: Malachy Moan, M.D.   Ct Head Wo Contrast  05/20/2013   *RADIOLOGY REPORT*  Clinical Data: Altered mental status, syncopal episode  CT HEAD WITHOUT CONTRAST  Technique:  Contiguous axial images were obtained from  the base of the skull through the vertex without contrast.  Comparison: None.  Findings: Remote anterior left basal ganglia lacunar type infarct with adjacent ex vacuo dilatation of the left lateral ventricle frontal horn.  No acute intracranial hemorrhage, mass lesion, definite infarction, midline shift, herniation pneumocephalus, or extra-axial fluid collection.  Normal gray-white matter differentiation.  No cerebellar abnormality. Cisterns are patent. Orbits are unremarkable.  Mastoids are clear.  Sinuses are clear except for minimal left posterior ethmoid secretions.  No skull abnormality.  IMPRESSION: Remote left basal ganglia lacunar infarct.  No acute finding by noncontrast CT   Original Report Authenticated By: Judie Petit. Miles Costain, M.D.   Mr Maxine Glenn Head Wo Contrast  05/21/2013   *RADIOLOGY REPORT*  Clinical Data:  52 year old female with diabetes, hypertension. Recurrent syncope.  Acute renal injury.  Loss of consciousness.  Comparison: Head CT without contrast 05/20/2013.  MRI HEAD WITHOUT CONTRAST  Technique: Multiplanar, multiecho pulse sequences of the brain and surrounding structures were obtained according to standard protocol without intravenous contrast.  Findings: Chronic left basal ganglia lacunar infarct.  Ex vacuo enlargement of the left frontal horn.  No restricted diffusion to suggest acute infarction.  Major intracranial vascular flow voids are preserved.  No midline shift, ventriculomegaly, mass effect, evidence of mass lesion, extra-axial collection or acute intracranial hemorrhage. Cervicomedullary junction and pituitary are within normal limits. Negative visualized cervical spine.  Outside of the left basal ganglia, normal for age gray and white matter signal.  Visualized orbit soft tissues are within normal limits.  Small volume fluid and bubbly opacity posterior left ethmoid air cells. Minimal mucosal thickening in the sphenoid sinuses.  Other Visualized paranasal sinuses and mastoids are clear.   Grossly negative visualized internal auditory structures.  Normal bone marrow signal.  Negative scalp soft tissues.  IMPRESSION: 1. No acute intracranial abnormality. 2.  Chronic left basal  ganglia infarct. 3.  MRA findings below.  MRA HEAD WITHOUT CONTRAST  Technique: Angiographic images of the Circle of Willis were obtained using MRA technique without  intravenous contrast.  Findings: Antegrade flow in the posterior circulation with codominant distal vertebral arteries.  Normal left PICA.  Dominant appearing right AICA.  Normal vertebrobasilar junction and basilar artery.  Incidental duplicated left SCA.  Right SCA and PCA origins are within normal limits.  Posterior communicating arteries are diminutive or absent.  Bilateral PCA branches are within normal limits.  (Suggestion of right PCA P2 segment stenosis on MIP images not identified on source images).  Antegrade flow in both ICA siphons.  No ICA stenosis.  Ophthalmic artery origins are within normal limits.  Normal carotid termini, MCA and ACA origins.  Anterior communicating artery and visualized ACA branches are within normal limits.  Visualized right MCA branches are within normal limits.  Left MCA M1 segment and visualized left MCA branches are within normal limits.  IMPRESSION: Negative intracranial MRA.   Original Report Authenticated By: Erskine Speed, M.D.   Mr Brain Wo Contrast  05/21/2013   *RADIOLOGY REPORT*  Clinical Data:  52 year old female with diabetes, hypertension. Recurrent syncope.  Acute renal injury.  Loss of consciousness.  Comparison: Head CT without contrast 05/20/2013.  MRI HEAD WITHOUT CONTRAST  Technique: Multiplanar, multiecho pulse sequences of the brain and surrounding structures were obtained according to standard protocol without intravenous contrast.  Findings: Chronic left basal ganglia lacunar infarct.  Ex vacuo enlargement of the left frontal horn.  No restricted diffusion to suggest acute infarction.  Major intracranial  vascular flow voids are preserved.  No midline shift, ventriculomegaly, mass effect, evidence of mass lesion, extra-axial collection or acute intracranial hemorrhage. Cervicomedullary junction and pituitary are within normal limits. Negative visualized cervical spine.  Outside of the left basal ganglia, normal for age gray and white matter signal.  Visualized orbit soft tissues are within normal limits.  Small volume fluid and bubbly opacity posterior left ethmoid air cells. Minimal mucosal thickening in the sphenoid sinuses.  Other Visualized paranasal sinuses and mastoids are clear.  Grossly negative visualized internal auditory structures.  Normal bone marrow signal.  Negative scalp soft tissues.  IMPRESSION: 1. No acute intracranial abnormality. 2.  Chronic left basal ganglia infarct. 3.  MRA findings below.  MRA HEAD WITHOUT CONTRAST  Technique: Angiographic images of the Circle of Willis were obtained using MRA technique without  intravenous contrast.  Findings: Antegrade flow in the posterior circulation with codominant distal vertebral arteries.  Normal left PICA.  Dominant appearing right AICA.  Normal vertebrobasilar junction and basilar artery.  Incidental duplicated left SCA.  Right SCA and PCA origins are within normal limits.  Posterior communicating arteries are diminutive or absent.  Bilateral PCA branches are within normal limits.  (Suggestion of right PCA P2 segment stenosis on MIP images not identified on source images).  Antegrade flow in both ICA siphons.  No ICA stenosis.  Ophthalmic artery origins are within normal limits.  Normal carotid termini, MCA and ACA origins.  Anterior communicating artery and visualized ACA branches are within normal limits.  Visualized right MCA branches are within normal limits.  Left MCA M1 segment and visualized left MCA branches are within normal limits.  IMPRESSION: Negative intracranial MRA.   Original Report Authenticated By: Erskine Speed, M.D.   Ct Abdomen  Pelvis W Contrast  05/01/2013   *RADIOLOGY REPORT*  Clinical Data: Painless rectal bleeding.  CT ABDOMEN AND PELVIS WITH  CONTRAST  Technique:  Multidetector CT imaging of the abdomen and pelvis was performed following the standard protocol during bolus administration of intravenous contrast.  Contrast: OMNIPAQUE IOHEXOL 300 MG/ML  SOLN  Comparison: None.  Findings:  BODY WALL: Unremarkable.  LOWER CHEST:  Mediastinum: Unremarkable.  Lungs/pleura: No consolidation.  ABDOMEN/PELVIS:  Liver: 3.4 cm mass in segment six.  On the portal venous phase there is peripheral nodular enhancement that increases on delayed imaging.  No additional focal abnormality detected.  Biliary: No evidence of biliary obstruction or stone.  Pancreas: Unremarkable.  Spleen: Unremarkable.  Adrenals: Unremarkable.  Kidneys and ureters: No hydronephrosis or stone. 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  Bladder: Unremarkable.  Bowel: Circumferential bowel wall thickening beginning at the splenic flexure and continuing to the rectosigmoid junction.  There is pericolonic fat stranding.  Although the extensive submucosal edema distorts anatomy, there appears to be mucosal and serosal enhancement along the affected segment.  The IMA and IMV are patent.  No bowel obstruction.  Normal appendix.  Retroperitoneum: No mass or adenopathy.  Peritoneum: No free fluid or gas.  Reproductive: Hysterectomy.  Vascular: Aortic atherosclerosis, most notably at the aortic hiatus.  OSSEOUS: No acute abnormalities. Remote inferior pubic ramus fractures bilaterally.  Advanced degenerative disc disease at L5- L1.  IMPRESSION: 1. Colitis of the descending and sigmoid colon.  The extent favors infectious/inflammatory colitis over ischemic colitis.  2.  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  3.  Bilateral renal cortical scarring, pattern favoring prior vesicoureteral reflux.   Original Report  Authenticated By: Tiburcio Pea   Dg Knee Complete 4 Views Left  05/20/2013   *RADIOLOGY REPORT*  Clinical Data: 52 year old female status post fall with left lower extremity pain.  LEFT KNEE - COMPLETE 4+ VIEW  Comparison: Left hip series from the same day reported separately.  Findings: No definite joint effusion.  Patella intact.  Joint spaces appear preserved.  No acute fracture identified.  IMPRESSION: No acute fracture or dislocation identified about the left knee.   Original Report Authenticated By: Erskine Speed, M.D.    Microbiology: No results found for this or any previous visit (from the past 240 hour(s)).   Labs: Basic Metabolic Panel:  Recent Labs Lab 05/20/13 0904 05/20/13 1635  NA 137  --   K 4.3  --   CL 101  --   CO2 19  --   GLUCOSE 230*  --   BUN 46*  --   CREATININE 2.54* 1.55*  CALCIUM 9.9  --    Liver Function Tests:  Recent Labs Lab 05/20/13 0904  AST 17  ALT 26  ALKPHOS 66  BILITOT 0.1*  PROT 6.7  ALBUMIN 3.9   No results found for this basename: LIPASE, AMYLASE,  in the last 168 hours No results found for this basename: AMMONIA,  in the last 168 hours CBC:  Recent Labs Lab 05/20/13 0904 05/20/13 1635  WBC 7.6 7.7  NEUTROABS 4.1  --   HGB 11.8* 11.9*  HCT 35.6* 34.3*  MCV 91.3 91.0  PLT 415* 408*   Cardiac Enzymes: No results found for this basename: CKTOTAL, CKMB, CKMBINDEX, TROPONINI,  in the last 168 hours BNP: BNP (last 3 results) No results found for this basename: PROBNP,  in the last 8760 hours CBG:  Recent Labs Lab 05/21/13 0033 05/21/13 0800 05/21/13 1139 05/21/13 1202 05/21/13 1307  GLUCAP 92 147* 65* 61* 112*    Time coordinating discharge:  45 minutes  Signed:  Jadore Mcguffin  [facility] 05/21/2013, 2:28 PM

## 2013-05-21 NOTE — Progress Notes (Signed)
VASCULAR LAB PRELIMINARY  PRELIMINARY  PRELIMINARY  PRELIMINARY  Carotid Dopplers completed.    Preliminary report:  There is 1-39% ICA stenosis.  Vertebral artery flow is antegrade.Marland Kitchen  Ashley [name], RVT 05/21/2013, 11:02 AM

## 2013-05-23 NOTE — Progress Notes (Signed)
05/20/13 1653  PT G-Codes **NOT FOR INPATIENT CLASS**  Functional Assessment Tool Used clinician expeirence  Functional Limitation Mobility: Walking and moving around  Mobility: Walking and Moving Around Current Status ([id number]) CI  Mobility: Walking and Moving Around Goal Status [phone]) CI  Mobility: Walking and Moving Around Discharge Status [phone]) CI  late g- code entry for eval preformed 05/20/13 Jourdan Durbin B. Thom Ollinger, PT, DPT [phone]

## 2013-05-24 ENCOUNTER — Telehealth: Payer: Self-pay | Admitting: Internal Medicine

## 2013-05-24 MED ORDER — PREDNISONE 5 MG PO TABS: 5.0000 mg | ORAL_TABLET | Freq: Every day | ORAL | Status: DC

## 2013-05-24 NOTE — Telephone Encounter (Signed)
AM cortisol level is low.  She needs a formal cortisol stim test and further testing based on those results to determine etiology, however, it may take some time for these tests to be completed.  Will give Rx for prednisone until further testing can be arranged >> will need to be discontinued prior to stim test.

## 2013-07-18 ENCOUNTER — Encounter (HOSPITAL_COMMUNITY): Payer: Self-pay | Admitting: Emergency Medicine

## 2013-07-18 ENCOUNTER — Emergency Department (HOSPITAL_COMMUNITY)
Admission: EM | Admit: 2013-07-18 | Discharge: 2013-07-18 | Disposition: A | Discharge status: Home / Self Care | Payer: Federal, State, Local not specified - PPO | Attending: Emergency Medicine | Admitting: Emergency Medicine

## 2013-07-18 DIAGNOSIS — I82531 Chronic embolism and thrombosis of right popliteal vein: Secondary | ICD-10-CM

## 2013-07-18 DIAGNOSIS — M79609 Pain in unspecified limb: Secondary | ICD-10-CM

## 2013-07-18 DIAGNOSIS — Z79899 Other long term (current) drug therapy: Secondary | ICD-10-CM | POA: Insufficient documentation

## 2013-07-18 DIAGNOSIS — F17200 Nicotine dependence, unspecified, uncomplicated: Secondary | ICD-10-CM | POA: Insufficient documentation

## 2013-07-18 DIAGNOSIS — I825Y9 Chronic embolism and thrombosis of unspecified deep veins of unspecified proximal lower extremity: Secondary | ICD-10-CM | POA: Insufficient documentation

## 2013-07-18 DIAGNOSIS — E119 Type 2 diabetes mellitus without complications: Secondary | ICD-10-CM | POA: Insufficient documentation

## 2013-07-18 DIAGNOSIS — IMO0002 Reserved for concepts with insufficient information to code with codable children: Secondary | ICD-10-CM | POA: Insufficient documentation

## 2013-07-18 DIAGNOSIS — I10 Essential (primary) hypertension: Secondary | ICD-10-CM | POA: Insufficient documentation

## 2013-07-18 DIAGNOSIS — M79604 Pain in right leg: Secondary | ICD-10-CM

## 2013-07-18 DIAGNOSIS — F319 Bipolar disorder, unspecified: Secondary | ICD-10-CM | POA: Insufficient documentation

## 2013-07-18 DIAGNOSIS — Z8719 Personal history of other diseases of the digestive system: Secondary | ICD-10-CM | POA: Insufficient documentation

## 2013-07-18 MED ORDER — TRAMADOL HCL 50 MG PO TABS
50.0000 mg | ORAL_TABLET | Freq: Once | ORAL | Status: AC
  Administered 2013-07-18: 50 mg via ORAL
  Filled 2013-07-18: qty 1

## 2013-07-18 MED ORDER — TRAMADOL HCL 50 MG PO TABS: 50.0000 mg | ORAL_TABLET | Freq: Four times a day (QID) | ORAL | Status: DC | PRN

## 2013-07-18 NOTE — ED Notes (Signed)
Called vascular lab regarding study. "will do next". Pt informed.

## 2013-07-18 NOTE — ED Provider Notes (Signed)
CSN: [id number]     Arrival date & time 07/18/13  0950 History  This chart was scribed for non-physician practitioner Junious Silk, PA-C, working with Shelda Jakes, MD by Dorothey Baseman, ED Scribe. This patient was seen in room TR07C/TR07C and the patient's care was started at 10:29 AM.    Chief Complaint  Patient presents with  . Leg Pain   The history is provided by the patient. No language interpreter was used.   HPI Comments: Ashley [name] is a 52 y.o. female who presents to the Emergency Department complaining of a constant, throbbing pain to the left leg onset 1-2 months ago secondary to falling on it. She states that she was seen here for the fall (last encounter in August, 2014) and received x-rays of the left leg that were negative and was referred to an orthopaedist, but she states that she did not follow up and that the symptoms persist. Patient reports an associated pain to the right hip that radiates down the leg secondary to bearing weight to compensate for the injured leg. She states that she has tried Tylenol and ibuprofen at home without relief. Patient reports that she uses a cane for daily ambulation. She denies any recent travel or surgeries. Patient denies history of DVT or PE. Patient reports a history of DM without complication and HTN.  Past Medical History  Diagnosis Date  . Diabetes mellitus without complication   . GERD (gastroesophageal reflux disease)   . HTN (hypertension)   . Bipolar 1 disorder    Past Surgical History  Procedure Laterality Date  . Abdominal hysterectomy     Family History  Problem Relation Age of Onset  . Diabetes Mellitus II Father   . Diabetes Brother   . Heart disease Neg Hx   . Stroke Neg Hx   . Kidney disease Neg Hx    History  Substance Use Topics  . Smoking status: Current Every Day Smoker -- 1.00 packs/day for 33 years    Types: Cigarettes  . Smokeless tobacco: Not on file  . Alcohol Use: No   OB History   Grav  Para Term Preterm Abortions TAB SAB Ect Mult Living   4 3 2  1    1 4      Review of Systems  Respiratory: Negative for cough, shortness of breath and wheezing.   Cardiovascular: Negative for chest pain.  Musculoskeletal: Positive for arthralgias and myalgias.  All other systems reviewed and are negative.    Allergies  Review of patient's allergies indicates no known allergies.  Home Medications   Current Outpatient Rx  Name  Route  Sig  Dispense  Refill  . lisinopril (PRINIVIL,ZESTRIL) 20 MG tablet   Oral   Take 20 mg by mouth daily.         . metFORMIN (GLUCOPHAGE) 500 MG tablet   Oral   Take 500 mg by mouth 2 (two) times daily with a meal.         . predniSONE (DELTASONE) 5 MG tablet   Oral   Take 1 tablet (5 mg total) by mouth daily.   30 tablet   0   . promethazine (PHENERGAN) 12.5 MG tablet   Oral   Take 1 tablet (12.5 mg total) by mouth every 6 (six) hours as needed for nausea.   15 tablet   0   . QUEtiapine (SEROQUEL) 400 MG tablet   Oral   Take 800 mg by mouth at bedtime.         Marland Kitchen  rosuvastatin (CRESTOR) 10 MG tablet   Oral   Take 1 tablet (10 mg total) by mouth daily.   30 tablet   0   . traMADol (ULTRAM) 50 MG tablet   Oral   Take 2 tablets (100 mg total) by mouth every 12 (twelve) hours as needed.   20 tablet   0    Triage Vitals: BP 126/83  Pulse 54  Temp(Src) 98.1 F (36.7 C) (Oral)  Resp 20  Wt 174 lb (78.926 kg)  BMI 28.1 kg/m2  SpO2 99%  Physical Exam  Nursing note and vitals reviewed. Constitutional: She is oriented to person, place, and time. She appears well-developed and well-nourished. No distress.  HENT:  Head: Normocephalic and atraumatic.  Right Ear: External ear normal.  Left Ear: External ear normal.  Nose: Nose normal.  Eyes: Conjunctivae are normal.  Neck: Normal range of motion.  Cardiovascular: Normal rate, regular rhythm and normal heart sounds.   Pulmonary/Chest: Effort normal and breath sounds normal.  No stridor. No respiratory distress. She has no wheezes. She has no rales.  Abdominal: Soft. She exhibits no distension.  Musculoskeletal: Normal range of motion.  Tenderness to palpation over right calf. Diffusely tender to palpation over left knee.  Neurological: She is alert and oriented to person, place, and time. She has normal strength.  Skin: Skin is warm and dry. She is not diaphoretic. No erythema.  Psychiatric: She has a normal mood and affect. Her behavior is normal.    ED Course  Procedures (including critical care time)  Medications  traMADol (ULTRAM) tablet 50 mg (50 mg Oral Given 07/18/13 1040)   DIAGNOSTIC STUDIES: Oxygen Saturation is 99% on room air, normal by my interpretation.    COORDINATION OF CARE: 10:34 AM- Will order an ultrasound due to concern for DVT. Will order a dose of Tramadol to manage symptoms. Discussed treatment plan with patient at bedside and patient verbalized agreement.     Labs Review Labs Reviewed - No data to display Imaging Review No results found.  EKG Interpretation   None       MDM   1. Chronic thromboembolism of popliteal vein, right   2. Leg pain, bilateral    Patient presents with bilateral leg pain. Chronic thromboembolism of popliteal vein found on right. Discussed this with Candace from Vascular who assures me the plaque is stable and does not require anticoagulation. Discussed this with the patient and referred her to vascular surgery. Patient was also given ortho referral for her left sided leg pain. Discussed case with Dr. Deretha Emory who agrees with plan. Strict return instructions given. Vital signs stable for discharge. Patient / Family / Caregiver informed of clinical course, understand medical decision-making process, and agree with plan.   I personally performed the services described in this documentation, which was scribed in my presence. The recorded information has been reviewed and is  accurate.      Mora Bellman, PA-C 07/18/13 [phone]

## 2013-07-18 NOTE — Progress Notes (Addendum)
*  PRELIMINARY RESULTS* Vascular Ultrasound Right lower extremity venous duplex has been completed.  Preliminary findings: no evidence of acute DVT involving the right lower extremity and left common femoral vein. There is a small area of chronic clot in the right popliteal vein.   Farrel Demark, RDMS, RVT  07/18/2013, 1:05 PM

## 2013-07-18 NOTE — ED Notes (Signed)
Pt reports she fell last in August and was hospitalized. Had xrays at that time. Now has continued pain and swelling to left knee. Was given antiinflammatories per PCP. Also c/o right calf pain. "I was afraid I might have a clot".

## 2013-07-18 NOTE — ED Notes (Signed)
Pt is here with left leg pain around knee area for one month since falling on it.  Pt reports right hip down leg pain started after trying to compensate for injured leg

## 2013-07-20 NOTE — ED Provider Notes (Signed)
Medical screening examination/treatment/procedure(s) were performed by non-physician practitioner and as supervising physician I was immediately available for consultation/collaboration.   Myrick Mcnairy W. Petronella Shuford, MD 07/20/13 1028 

## 2013-08-05 ENCOUNTER — Encounter: Payer: Self-pay | Admitting: Surgery

## 2013-08-08 ENCOUNTER — Encounter: Payer: Self-pay | Admitting: Surgery

## 2013-08-08 ENCOUNTER — Ambulatory Visit (INDEPENDENT_AMBULATORY_CARE_PROVIDER_SITE_OTHER): Payer: Medicare Other | Admitting: Surgery

## 2013-08-08 VITALS — BP 150/96 | HR 93 | Ht 66.0 in | Wt 171.7 lb

## 2013-08-08 DIAGNOSIS — I82431 Acute embolism and thrombosis of right popliteal vein: Secondary | ICD-10-CM

## 2013-08-08 DIAGNOSIS — I824Y9 Acute embolism and thrombosis of unspecified deep veins of unspecified proximal lower extremity: Secondary | ICD-10-CM

## 2013-08-08 DIAGNOSIS — I82439 Acute embolism and thrombosis of unspecified popliteal vein: Secondary | ICD-10-CM | POA: Insufficient documentation

## 2013-08-08 NOTE — Progress Notes (Signed)
[facility]   Patient name: Ashley [name] MRN: [id number] DOB: Aug 18, 1961 Sex: female   Referred by: ER  Reason for referral:  Chief Complaint  Patient presents with  . New Evaluation    ER referral- thromboembolism of R pop vein     HISTORY OF PRESENT ILLNESS: The patient comes in today for evaluation of a chronic of right leg DVT.  The patient has been having trouble since August.  In August she presented with infectious colitis which was treated with antibiotics.  She has also been undergoing a workup for syncope.  She has had a carotid ultrasound which showed minimal internal carotid stenosis bilaterally.  She also complained of leg pain and a venous ultrasound was performed.  This revealed chronic evidence of thromboembolism within the right popliteal vein.  The patient was referred to vascular surgery for further evaluation of this.  Patient states that she began having a charley horse-like pain in her right calf back in 2007 when she did in [place]. She denies having problems with swelling.  She denies claudication type symptoms.  She does report a shooting pain down her right thigh.  She has known lumbar degenerative disc disease.  Past Medical History  Diagnosis Date  . Diabetes mellitus without complication   . GERD (gastroesophageal reflux disease)   . HTN (hypertension)   . Bipolar 1 disorder     Past Surgical History  Procedure Laterality Date  . Abdominal hysterectomy      History   Social History  . Marital Status: Divorced    Spouse Name: N/A    Number of Children: N/A  . Years of Education: N/A   Occupational History  . Not on file.   Social History Main Topics  . Smoking status: Current Every Day Smoker -- 1.00 packs/day for 33 years    Types: Cigarettes  . Smokeless tobacco: Not on file     Comment: pt states that she is using the E cigs and is trying to quit  . Alcohol Use: No  . Drug Use: Yes    Special: Marijuana   . Sexual Activity: Yes    Birth Control/ Protection: Surgical   Other Topics Concern  . Not on file   Social History Narrative   Lives alone in an apartment.  Drives.  Does not work, disabled due to bipolar.     Family History  Problem Relation Age of Onset  . Diabetes Mellitus II Father   . Diabetes Brother   . Heart disease Neg Hx   . Stroke Neg Hx   . Kidney disease Neg Hx     Allergies as of 08/08/2013  . (No Known Allergies)    Current Outpatient Prescriptions on File Prior to Visit  Medication Sig Dispense Refill  . metFORMIN (GLUCOPHAGE) 500 MG tablet Take 500 mg by mouth 2 (two) times daily with a meal.      . QUEtiapine (SEROQUEL) 400 MG tablet Take 800 mg by mouth at bedtime.      . rosuvastatin (CRESTOR) 10 MG tablet Take 1 tablet (10 mg total) by mouth daily.  30 tablet  0  . traMADol (ULTRAM) 50 MG tablet Take 1 tablet (50 mg total) by mouth every 6 (six) hours as needed for pain.  15 tablet  0   No current facility-administered medications on file prior to visit.     REVIEW OF SYSTEMS: Please see history of present illness, otherwise negative  PHYSICAL EXAMINATION:  General: The patient appears their stated age.  Vital signs are BP 150/96  Pulse 93  Ht 5\' 6"  (1.676 m)  Wt 171 lb 11.2 oz (77.883 kg)  BMI 27.73 kg/m2  SpO2 93% HEENT:  No gross abnormalities Pulmonary: Respirations are non-labored Abdomen: Soft and non-tender  Musculoskeletal: There are no major deformities.   Neurologic: No focal weakness or paresthesias are detected, Skin: There are no ulcer or rashes noted. Psychiatric: The patient has normal affect. Cardiovascular: There is a regular rate and rhythm without significant murmur appreciated.  No carotid bruits.  Pedal pulses are not palpable.  No swelling in either extremity. Diagnostic Studies: None  Outside Studies/Documentation Historical records were reviewed.  They showed no significant extracranial internal carotid disease.   No acute DVT.  Medication Changes: I have recommended the patient start taking a baby aspirin  Assessment:  Chronic right leg DVT Plan: The patient's DVT likely occurred back in 2007.  She has had no other episodes.  She does not have any evidence of a postphlebitic syndrome.  I have recommended that she start taking a baby aspirin.  I also encouraged her to avoid prolonged periods of inactivity particularly on long chlorides.  She will followup on a when necessary basis.     Jorge Ny, M.D. [facility] [place] Office: [phone] Pager:  [phone]

## 2013-11-17 ENCOUNTER — Encounter (HOSPITAL_COMMUNITY): Payer: Self-pay | Admitting: Emergency Medicine

## 2013-11-17 ENCOUNTER — Emergency Department (HOSPITAL_COMMUNITY)
Admission: EM | Admit: 2013-11-17 | Discharge: 2013-11-17 | Disposition: A | Discharge status: Home / Self Care | Payer: Federal, State, Local not specified - PPO | Attending: Emergency Medicine | Admitting: Emergency Medicine

## 2013-11-17 DIAGNOSIS — Y929 Unspecified place or not applicable: Secondary | ICD-10-CM | POA: Insufficient documentation

## 2013-11-17 DIAGNOSIS — X500XXA Overexertion from strenuous movement or load, initial encounter: Secondary | ICD-10-CM | POA: Insufficient documentation

## 2013-11-17 DIAGNOSIS — E119 Type 2 diabetes mellitus without complications: Secondary | ICD-10-CM | POA: Insufficient documentation

## 2013-11-17 DIAGNOSIS — S335XXA Sprain of ligaments of lumbar spine, initial encounter: Secondary | ICD-10-CM | POA: Insufficient documentation

## 2013-11-17 DIAGNOSIS — F319 Bipolar disorder, unspecified: Secondary | ICD-10-CM | POA: Insufficient documentation

## 2013-11-17 DIAGNOSIS — Y9389 Activity, other specified: Secondary | ICD-10-CM | POA: Insufficient documentation

## 2013-11-17 DIAGNOSIS — I10 Essential (primary) hypertension: Secondary | ICD-10-CM | POA: Insufficient documentation

## 2013-11-17 DIAGNOSIS — F17200 Nicotine dependence, unspecified, uncomplicated: Secondary | ICD-10-CM | POA: Insufficient documentation

## 2013-11-17 DIAGNOSIS — M545 Low back pain, unspecified: Secondary | ICD-10-CM | POA: Insufficient documentation

## 2013-11-17 DIAGNOSIS — S39012A Strain of muscle, fascia and tendon of lower back, initial encounter: Secondary | ICD-10-CM

## 2013-11-17 DIAGNOSIS — M549 Dorsalgia, unspecified: Secondary | ICD-10-CM

## 2013-11-17 DIAGNOSIS — Z79899 Other long term (current) drug therapy: Secondary | ICD-10-CM | POA: Insufficient documentation

## 2013-11-17 DIAGNOSIS — Z8719 Personal history of other diseases of the digestive system: Secondary | ICD-10-CM | POA: Insufficient documentation

## 2013-11-17 MED ORDER — OXYCODONE-ACETAMINOPHEN 5-325 MG PO TABS
1.0000 | ORAL_TABLET | Freq: Once | ORAL | Status: AC
  Administered 2013-11-17: 1 via ORAL
  Filled 2013-11-17: qty 1

## 2013-11-17 MED ORDER — METHOCARBAMOL 500 MG PO TABS
500.0000 mg | ORAL_TABLET | Freq: Two times a day (BID) | ORAL | Status: DC
Start: 2013-11-17 — End: 2014-01-17

## 2013-11-17 NOTE — Discharge Instructions (Signed)
Take the prescribed medication as directed.  May wish to apply heat to low back to help with pain. Return to the ED for new or worsening symptoms.

## 2013-11-17 NOTE — ED Notes (Signed)
Pt states she picked up her grandchild and felt a sharp pain in lower back. States back has been hurting since then. Has not taken otc meds for the pain. States tylenol and Tramadol do not work for her.

## 2013-11-17 NOTE — ED Provider Notes (Signed)
CSN: [id number]     Arrival date & time 11/17/13  1439 History  This chart was scribed for Quincy Carnes, PA working with Virgel Manifold, MD by Roxan Diesel, ED Scribe. This patient was seen in room TR06C/TR06C and the patient's care was started at 3:28 PM.   Chief Complaint  Patient presents with  . Back Pain   The history is provided by the patient. No language interpreter was used.   HPI Comments: Ashley [name] is a 53 y.o. female with h/o DM, HTN, and bipolar disorder who presents to the Emergency Department complaining of sudden-onset lower back pain that began 3 days ago when she lifted her grandchild.  Pt localizes her pain to the lower back with some radiation into her right lower leg.  She has attempted to treat pain with OTC medications, without relief.  She states her pain has prevented her from sleeping at night.  Pt notes that she has a h/o back injuries and has an appointment to receive an MRI of her back in March.  Denies loss of bowel or bladder control.  Denies numbness or paresthesias of LE.  VS stable on arrival.   Past Medical History  Diagnosis Date  . Diabetes mellitus without complication   . GERD (gastroesophageal reflux disease)   . HTN (hypertension)   . Bipolar 1 disorder     Past Surgical History  Procedure Laterality Date  . Abdominal hysterectomy      Family History  Problem Relation Age of Onset  . Diabetes Mellitus II Father   . Diabetes Brother   . Heart disease Neg Hx   . Stroke Neg Hx   . Kidney disease Neg Hx     History  Substance Use Topics  . Smoking status: Current Every Day Smoker -- 1.00 packs/day for 33 years    Types: Cigarettes  . Smokeless tobacco: Not on file     Comment: pt states that she is using the E cigs and is trying to quit  . Alcohol Use: No    OB History   Grav Para Term Preterm Abortions TAB SAB Ect Mult Living   4 3 2  1    1 4        Review of Systems  Constitutional: Negative for fever.   Gastrointestinal: Negative for diarrhea.  Genitourinary: Negative for enuresis.  Musculoskeletal: Positive for back pain.  Neurological: Negative for weakness.  All other systems reviewed and are negative.    Allergies  Asa and Ibuprofen  Home Medications   Current Outpatient Rx  Name  Route  Sig  Dispense  Refill  . metFORMIN (GLUCOPHAGE) 1000 MG tablet   Oral   Take 2,000 mg by mouth daily.         . QUEtiapine (SEROQUEL) 400 MG tablet   Oral   Take 800 mg by mouth at bedtime.         . metFORMIN (GLUCOPHAGE) 500 MG tablet   Oral   Take 500 mg by mouth 2 (two) times daily with a meal.          BP 147/94  Pulse 82  Temp(Src) 99.2 F (37.3 C) (Oral)  Resp 18  Ht 5\' 7"  (1.702 m)  Wt 170 lb (77.111 kg)  BMI 26.62 kg/m2  SpO2 100%  Physical Exam  Nursing note and vitals reviewed. Constitutional: She is oriented to person, place, and time. She appears well-developed and well-nourished. No distress.  HENT:  Head: Normocephalic and atraumatic.  Mouth/Throat: Oropharynx is clear and moist.  Eyes: Conjunctivae and EOM are normal. Pupils are equal, round, and reactive to light.  Neck: Normal range of motion.  Cardiovascular: Normal rate, regular rhythm and normal heart sounds.   Pulmonary/Chest: Effort normal and breath sounds normal. No respiratory distress. She has no wheezes.  Musculoskeletal: Normal range of motion.       Lumbar back: She exhibits tenderness and pain. She exhibits no bony tenderness and no deformity.  Lumbar paraspinal tenderness.  No midline tenderness, step-offs, or deformity.  Distal sensation intact.  Moving all extremities without ataxia.  Neurological: She is alert and oriented to person, place, and time.  Skin: Skin is warm and dry. She is not diaphoretic.  Psychiatric: She has a normal mood and affect.    ED Course  Procedures (including critical care time)  DIAGNOSTIC STUDIES: Oxygen Saturation is 100% on room air, normal by my  interpretation.    COORDINATION OF CARE: 3:31 PM-Discussed treatment plan which includes pain medication with pt at bedside and pt agreed to plan.   Labs Review Labs Reviewed - No data to display  Imaging Review No results found.  EKG Interpretation   None       MDM   Final diagnoses:  Back pain  Lumbar strain   Atraumatic back pain likely secondary to lumbar strain. No signs or symptoms concerning for cauda equina.  Patient given Percocet in the ED. Rx Robaxin. She may apply heat to low back to help with pain.  Discussed plan with pt, they agreed.  Return precautions advised.  I personally performed the services described in this documentation, which was scribed in my presence. The recorded information has been reviewed and is accurate.  Larene Pickett, PA-C 11/17/13 1624

## 2013-11-17 NOTE — ED Notes (Signed)
Pt reports picking up grandchild on Tuesday and having severe lower back pain since. Hx of same. Ambulatory at triage.

## 2013-11-21 NOTE — ED Provider Notes (Signed)
Medical screening examination/treatment/procedure(s) were performed by non-physician practitioner and as supervising physician I was immediately available for consultation/collaboration.  EKG Interpretation   None        Virgel Manifold, MD 11/21/13 1414

## 2014-01-15 ENCOUNTER — Emergency Department (HOSPITAL_COMMUNITY)
Admission: EM | Admit: 2014-01-15 | Discharge: 2014-01-15 | Discharge status: Against Medical Advice | Payer: Federal, State, Local not specified - PPO | Attending: Emergency Medicine | Admitting: Emergency Medicine

## 2014-01-15 ENCOUNTER — Encounter (HOSPITAL_COMMUNITY): Payer: Self-pay | Admitting: Emergency Medicine

## 2014-01-15 DIAGNOSIS — I10 Essential (primary) hypertension: Secondary | ICD-10-CM | POA: Insufficient documentation

## 2014-01-15 DIAGNOSIS — Y92410 Unspecified street and highway as the place of occurrence of the external cause: Secondary | ICD-10-CM | POA: Insufficient documentation

## 2014-01-15 DIAGNOSIS — F17200 Nicotine dependence, unspecified, uncomplicated: Secondary | ICD-10-CM | POA: Insufficient documentation

## 2014-01-15 DIAGNOSIS — Y9389 Activity, other specified: Secondary | ICD-10-CM | POA: Insufficient documentation

## 2014-01-15 DIAGNOSIS — S0990XA Unspecified injury of head, initial encounter: Secondary | ICD-10-CM | POA: Insufficient documentation

## 2014-01-15 DIAGNOSIS — E119 Type 2 diabetes mellitus without complications: Secondary | ICD-10-CM | POA: Insufficient documentation
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
## 2014-01-17 ENCOUNTER — Encounter (HOSPITAL_COMMUNITY): Payer: Self-pay | Admitting: Emergency Medicine

## 2014-01-17 ENCOUNTER — Emergency Department (HOSPITAL_COMMUNITY)
Admission: EM | Admit: 2014-01-17 | Discharge: 2014-01-17 | Disposition: A | Discharge status: Home / Self Care | Payer: Federal, State, Local not specified - PPO | Attending: Emergency Medicine | Admitting: Emergency Medicine

## 2014-01-17 ENCOUNTER — Emergency Department (HOSPITAL_COMMUNITY): Payer: Federal, State, Local not specified - PPO

## 2014-01-17 DIAGNOSIS — Z8719 Personal history of other diseases of the digestive system: Secondary | ICD-10-CM | POA: Insufficient documentation

## 2014-01-17 DIAGNOSIS — IMO0002 Reserved for concepts with insufficient information to code with codable children: Secondary | ICD-10-CM | POA: Insufficient documentation

## 2014-01-17 DIAGNOSIS — M549 Dorsalgia, unspecified: Secondary | ICD-10-CM

## 2014-01-17 DIAGNOSIS — Y92410 Unspecified street and highway as the place of occurrence of the external cause: Secondary | ICD-10-CM | POA: Insufficient documentation

## 2014-01-17 DIAGNOSIS — E119 Type 2 diabetes mellitus without complications: Secondary | ICD-10-CM | POA: Insufficient documentation

## 2014-01-17 DIAGNOSIS — F319 Bipolar disorder, unspecified: Secondary | ICD-10-CM | POA: Insufficient documentation

## 2014-01-17 DIAGNOSIS — Z79899 Other long term (current) drug therapy: Secondary | ICD-10-CM | POA: Insufficient documentation

## 2014-01-17 DIAGNOSIS — I10 Essential (primary) hypertension: Secondary | ICD-10-CM | POA: Insufficient documentation

## 2014-01-17 DIAGNOSIS — Y9389 Activity, other specified: Secondary | ICD-10-CM | POA: Insufficient documentation

## 2014-01-17 DIAGNOSIS — F17200 Nicotine dependence, unspecified, uncomplicated: Secondary | ICD-10-CM | POA: Insufficient documentation

## 2014-01-17 MED ORDER — HYDROCODONE-ACETAMINOPHEN 5-325 MG PO TABS: 1.0000 | ORAL_TABLET | Freq: Four times a day (QID) | ORAL | Status: DC | PRN
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
## 2014-01-17 NOTE — Discharge Instructions (Signed)
Back Pain, Adult Low back pain is very common. About 1 in 5 people have back pain.The cause of low back pain is rarely dangerous. The pain often gets better over time.About half of people with a sudden onset of back pain feel better in just 2 weeks. About 8 in 10 people feel better by 6 weeks.  CAUSES Some common causes of back pain include:  Strain of the muscles or ligaments supporting the spine.  Wear and tear (degeneration) of the spinal discs.  Arthritis.  Direct injury to the back. DIAGNOSIS Most of the time, the direct cause of low back pain is not known.However, back pain can be treated effectively even when the exact cause of the pain is unknown.Answering your caregiver's questions about your overall health and symptoms is one of the most accurate ways to make sure the cause of your pain is not dangerous. If your caregiver needs more information, he or she may order lab work or imaging tests (X-rays or MRIs).However, even if imaging tests show changes in your back, this usually does not require surgery. HOME CARE INSTRUCTIONS For many people, back pain returns.Since low back pain is rarely dangerous, it is often a condition that people can learn to manageon their own.   Remain active. It is stressful on the back to sit or stand in one place. Do not sit, drive, or stand in one place for more than 30 minutes at a time. Take short walks on level surfaces as soon as pain allows.Try to increase the length of time you walk each day.  Do not stay in bed.Resting more than 1 or 2 days can delay your recovery.  Do not avoid exercise or work.Your body is made to move.It is not dangerous to be active, even though your back may hurt.Your back will likely heal faster if you return to being active before your pain is gone.  Pay attention to your body when you bend and lift. Many people have less discomfortwhen lifting if they bend their knees, keep the load close to their bodies,and  avoid twisting. Often, the most comfortable positions are those that put less stress on your recovering back.  Find a comfortable position to sleep. Use a firm mattress and lie on your side with your knees slightly bent. If you lie on your back, put a pillow under your knees.  Only take over-the-counter or prescription medicines as directed by your caregiver. Over-the-counter medicines to reduce pain and inflammation are often the most helpful.Your caregiver may prescribe muscle relaxant drugs.These medicines help dull your pain so you can more quickly return to your normal activities and healthy exercise.  Put ice on the injured area.  Put ice in a plastic bag.  Place a towel between your skin and the bag.  Leave the ice on for 15-20 minutes, 03-04 times a day for the first 2 to 3 days. After that, ice and heat may be alternated to reduce pain and spasms.  Ask your caregiver about trying back exercises and gentle massage. This may be of some benefit.  Avoid feeling anxious or stressed.Stress increases muscle tension and can worsen back pain.It is important to recognize when you are anxious or stressed and learn ways to manage it.Exercise is a great option. SEEK MEDICAL CARE IF:  You have pain that is not relieved with rest or medicine.  You have pain that does not improve in 1 week.  You have new symptoms.  You are generally not feeling well. SEEK   IMMEDIATE MEDICAL CARE IF:   You have pain that radiates from your back into your legs.  You develop new bowel or bladder control problems.  You have unusual weakness or numbness in your arms or legs.  You develop nausea or vomiting.  You develop abdominal pain.  You feel faint. Document Released: 09/22/2005 Document Revised: 03/23/2012 Document Reviewed: 02/10/2011 [employer] Patient Information 2014 [employer].  
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
## 2014-01-17 NOTE — ED Notes (Signed)
Pt walked to Peds to see family will be back shortly.

## 2014-01-17 NOTE — ED Notes (Signed)
Pt transported to radiology.

## 2014-01-18 NOTE — ED Provider Notes (Signed)
Medical screening examination/treatment/procedure(s) were performed by non-physician practitioner and as supervising physician I was immediately available for consultation/collaboration.   EKG Interpretation None       Kiowa Hollar R. Koral Thaden, MD 01/18/14 0056 

## 2014-03-28 ENCOUNTER — Other Ambulatory Visit: Payer: Self-pay

## 2014-03-28 DIAGNOSIS — Z1231 Encounter for screening mammogram for malignant neoplasm of breast: Secondary | ICD-10-CM

## 2014-04-18 ENCOUNTER — Inpatient Hospital Stay: Admission: RE | Admit: 2014-04-18 | Payer: Medicare Other | Source: Ambulatory Visit

## 2014-04-18 ENCOUNTER — Encounter (INDEPENDENT_AMBULATORY_CARE_PROVIDER_SITE_OTHER): Payer: Self-pay

## 2014-04-18 ENCOUNTER — Ambulatory Visit
Admission: RE | Admit: 2014-04-18 | Discharge: 2014-04-18 | Disposition: A | Discharge status: Home / Self Care | Payer: Medicare Other | Source: Ambulatory Visit

## 2014-04-18 DIAGNOSIS — Z1231 Encounter for screening mammogram for malignant neoplasm of breast: Secondary | ICD-10-CM

## 2014-04-25 ENCOUNTER — Ambulatory Visit: Payer: Medicare Other

## 2014-04-25 IMAGING — CR DG HIP (WITH OR WITHOUT PELVIS) 2-3V*L*
3 series · 3 of 3 positions shown · non-contrast
Comparison: CT abdomen and pelvis 05/01/2013.

CLINICAL DATA: 52-year-old female status post fall with left side
pain.

LEFT HIP - COMPLETE 2+ VIEW

[t pelvis ap]
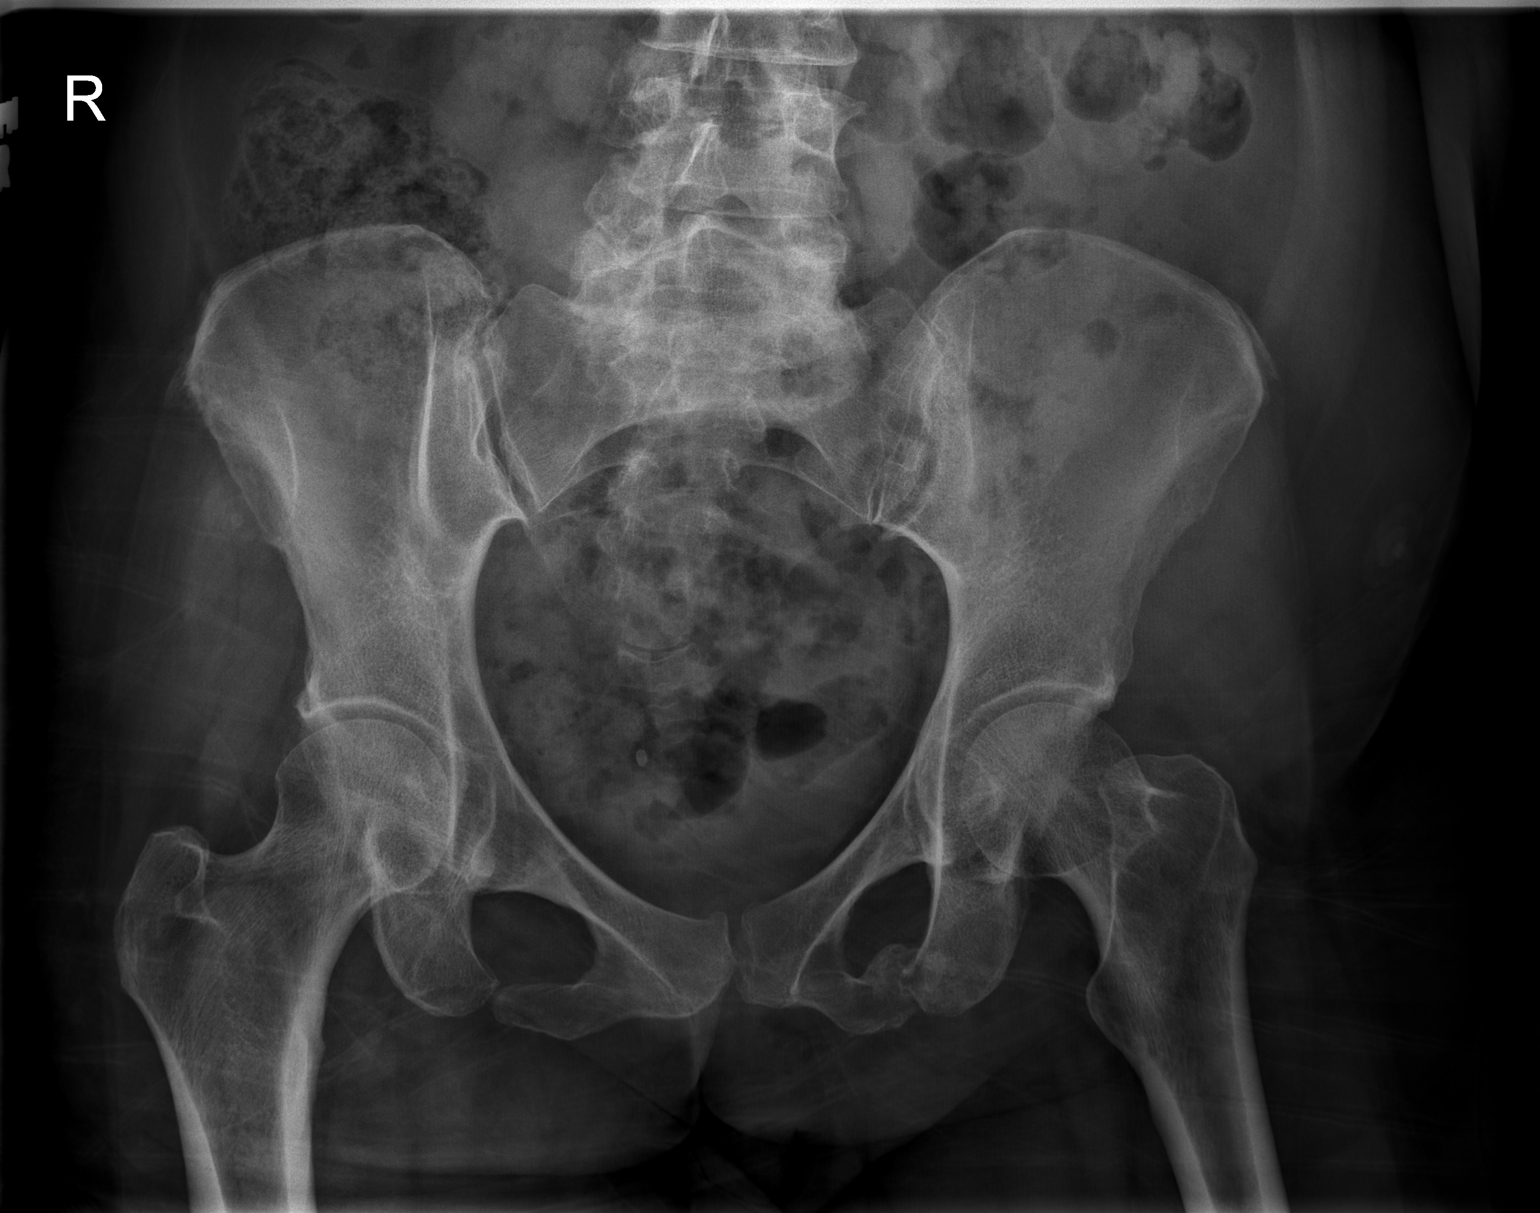

[t hip ap left]
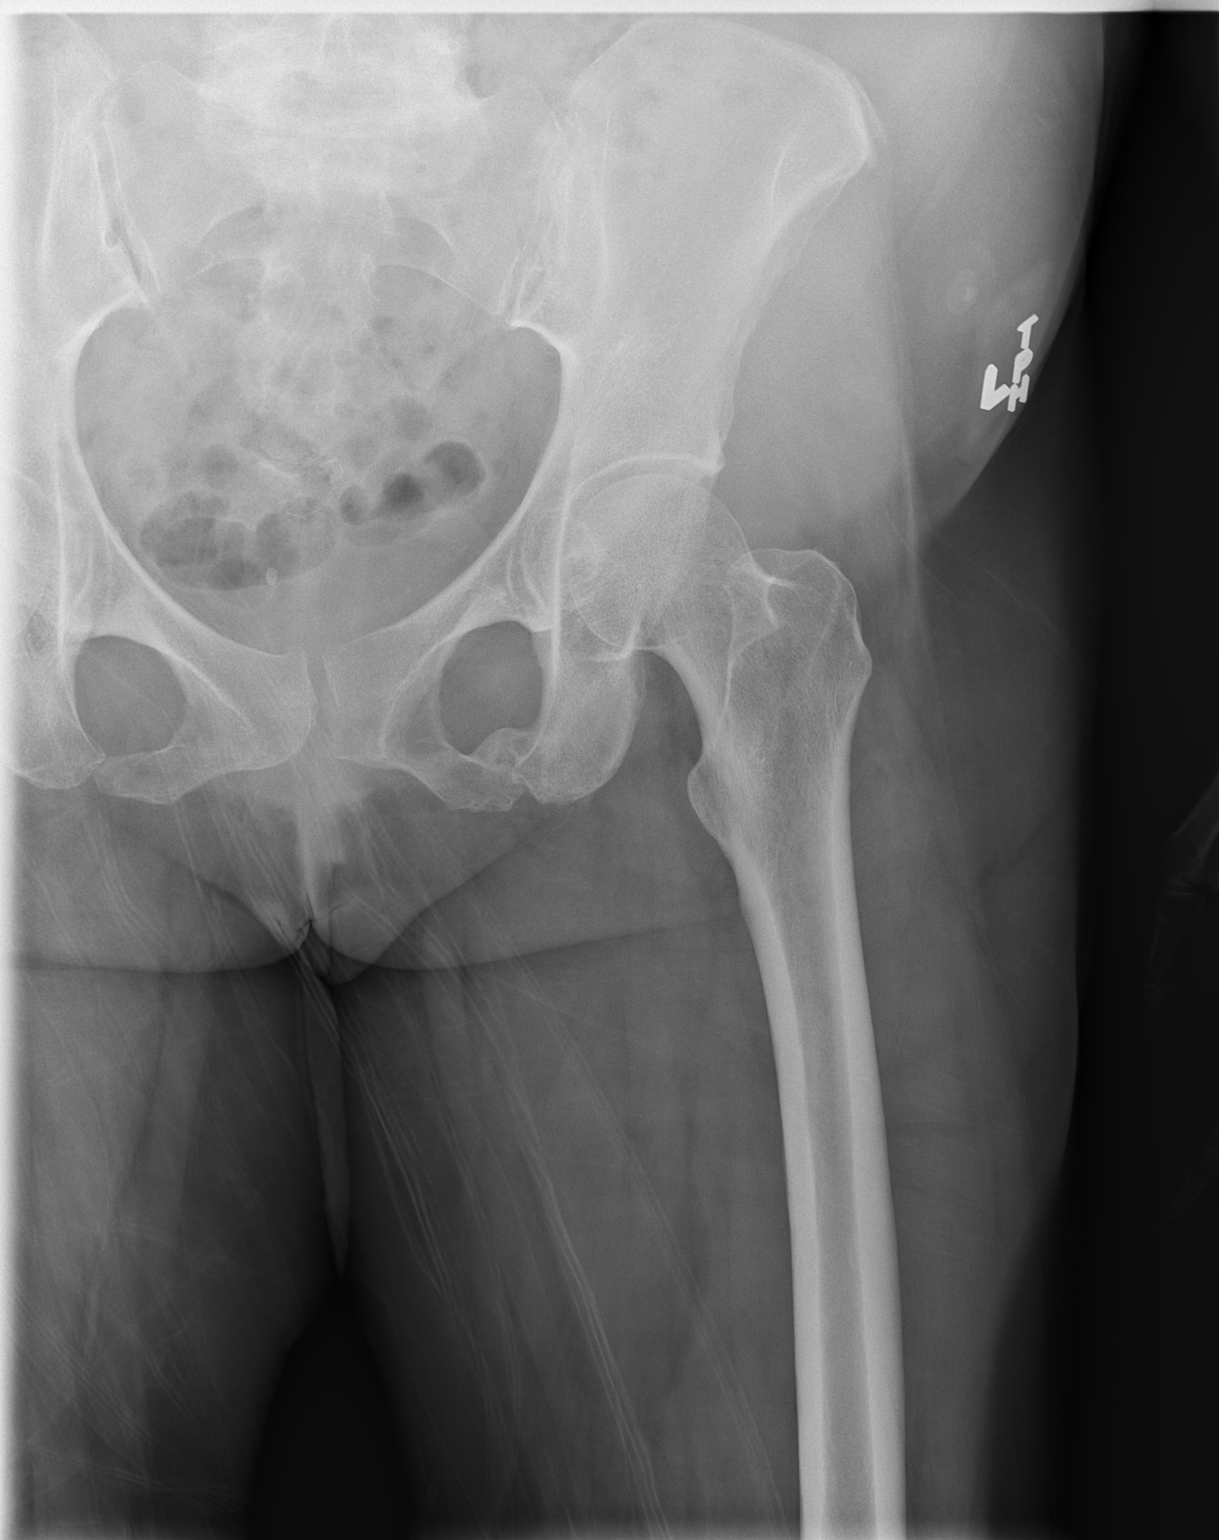

[t hip frog leg left]
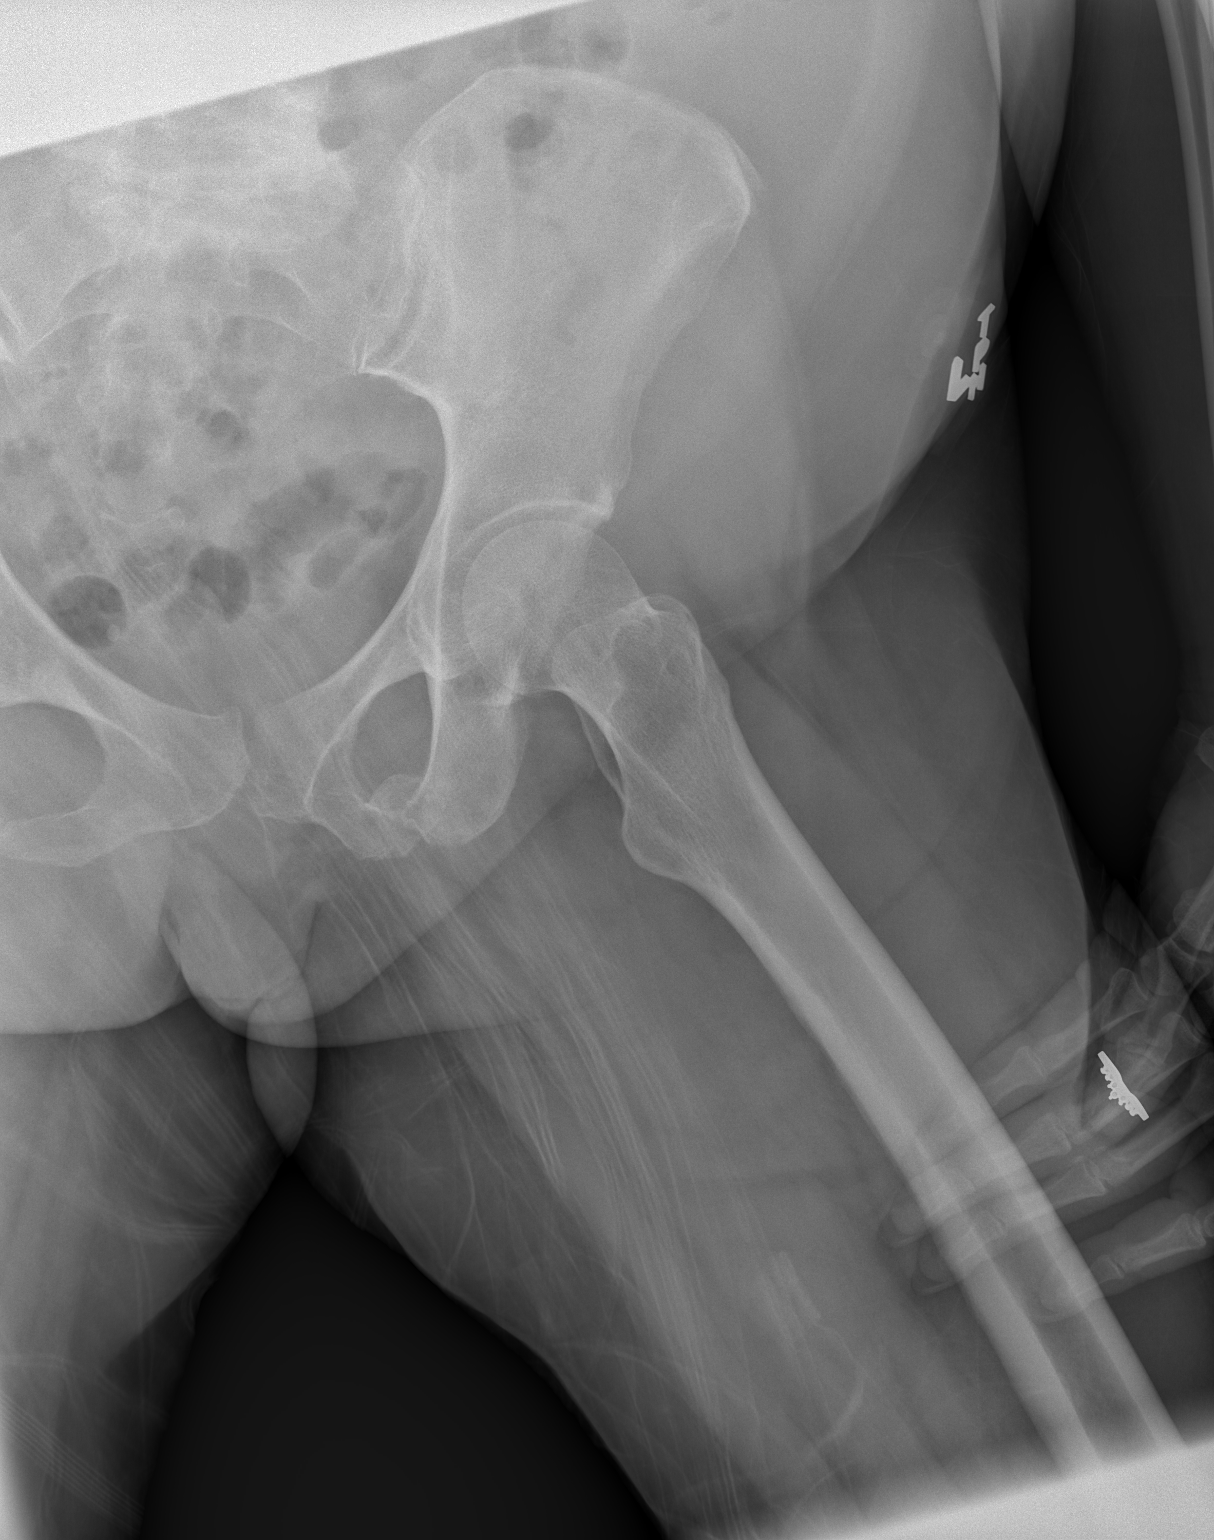

[3 of 3 positions shown; findings below may reference images not displayed]

FINDINGS: Chronic ununited bilateral inferior pubic rami fractures.
Femoral heads normally located.  Pelvis appears stable and intact.
Proximal left femur intact.
IMPRESSION: No acute fracture or dislocation identified about the left hip or
pelvis.  Chronic ununited bilateral inferior pubic rami fractures.

## 2014-06-07 ENCOUNTER — Other Ambulatory Visit: Payer: Self-pay | Admitting: Neurosurgery

## 2014-06-07 DIAGNOSIS — M549 Dorsalgia, unspecified: Secondary | ICD-10-CM

## 2014-07-05 ENCOUNTER — Encounter (HOSPITAL_COMMUNITY): Payer: Self-pay | Admitting: Emergency Medicine

## 2014-07-05 ENCOUNTER — Emergency Department (HOSPITAL_COMMUNITY)
Admission: EM | Admit: 2014-07-05 | Discharge: 2014-07-05 | Disposition: A | Discharge status: Home / Self Care | Payer: Federal, State, Local not specified - PPO | Attending: Emergency Medicine | Admitting: Emergency Medicine

## 2014-07-05 ENCOUNTER — Emergency Department (HOSPITAL_COMMUNITY): Payer: Federal, State, Local not specified - PPO

## 2014-07-05 DIAGNOSIS — I10 Essential (primary) hypertension: Secondary | ICD-10-CM | POA: Insufficient documentation

## 2014-07-05 DIAGNOSIS — S99929A Unspecified injury of unspecified foot, initial encounter: Principal | ICD-10-CM

## 2014-07-05 DIAGNOSIS — Z8719 Personal history of other diseases of the digestive system: Secondary | ICD-10-CM | POA: Insufficient documentation

## 2014-07-05 DIAGNOSIS — E119 Type 2 diabetes mellitus without complications: Secondary | ICD-10-CM | POA: Diagnosis not present

## 2014-07-05 DIAGNOSIS — F17200 Nicotine dependence, unspecified, uncomplicated: Secondary | ICD-10-CM | POA: Insufficient documentation

## 2014-07-05 DIAGNOSIS — Y929 Unspecified place or not applicable: Secondary | ICD-10-CM | POA: Diagnosis not present

## 2014-07-05 DIAGNOSIS — S99919A Unspecified injury of unspecified ankle, initial encounter: Secondary | ICD-10-CM | POA: Diagnosis not present

## 2014-07-05 DIAGNOSIS — F319 Bipolar disorder, unspecified: Secondary | ICD-10-CM | POA: Diagnosis not present

## 2014-07-05 DIAGNOSIS — Y939 Activity, unspecified: Secondary | ICD-10-CM | POA: Insufficient documentation

## 2014-07-05 DIAGNOSIS — X58XXXA Exposure to other specified factors, initial encounter: Secondary | ICD-10-CM | POA: Insufficient documentation

## 2014-07-05 DIAGNOSIS — S8990XA Unspecified injury of unspecified lower leg, initial encounter: Secondary | ICD-10-CM | POA: Insufficient documentation

## 2014-07-05 DIAGNOSIS — S99921A Unspecified injury of right foot, initial encounter: Secondary | ICD-10-CM

## 2014-07-05 MED ORDER — HYDROCODONE-ACETAMINOPHEN 5-325 MG PO TABS
2.0000 | ORAL_TABLET | Freq: Four times a day (QID) | ORAL | Status: DC | PRN
Start: 2014-07-05 — End: 2014-12-11

## 2014-07-05 NOTE — ED Provider Notes (Signed)
CSN: [id number]     Arrival date & time 07/05/14  1944 History   First MD Initiated Contact with Patient 07/05/14 2007     Chief Complaint  Patient presents with  . Toe Injury     (Consider location/radiation/quality/duration/timing/severity/associated sxs/prior Treatment) HPI Comments: Patient stubbed her toe on the edge of uneven pavement on September 18 since than has been soaking the foot as well as taking ASA and Ibuprofen without relief  Has not made an appointment with her PCP   The history is provided by the patient.    Past Medical History  Diagnosis Date  . Diabetes mellitus without complication   . GERD (gastroesophageal reflux disease)   . HTN (hypertension)   . Bipolar 1 disorder    Past Surgical History  Procedure Laterality Date  . Abdominal hysterectomy     Family History  Problem Relation Age of Onset  . Diabetes Mellitus II Father   . Diabetes Brother   . Heart disease Neg Hx   . Stroke Neg Hx   . Kidney disease Neg Hx    History  Substance Use Topics  . Smoking status: Current Every Day Smoker -- 1.00 packs/day for 33 years    Types: Cigarettes  . Smokeless tobacco: Not on file     Comment: pt states that she is using the E cigs and is trying to quit  . Alcohol Use: No   OB History   Grav Para Term Preterm Abortions TAB SAB Ect Mult Living   4 3 2  1    1 4      Review of Systems  Unable to perform ROS Constitutional: Negative for fever.  Musculoskeletal: Positive for joint swelling.  Neurological: Negative for numbness.  All other systems reviewed and are negative.     Allergies  Asa and Ibuprofen  Home Medications   Prior to Admission medications   Medication Sig Start Date End Date Taking? Authorizing Provider  metFORMIN (GLUCOPHAGE) 1000 MG tablet Take 2,000 mg by mouth daily.   Yes Historical Provider, MD  QUEtiapine (SEROQUEL) 400 MG tablet Take 800 mg by mouth at bedtime.   Yes Historical Provider, MD    HYDROcodone-acetaminophen (NORCO/VICODIN) 5-325 MG per tablet Take 2 tablets by mouth every 6 (six) hours as needed. 07/05/14   Garald Balding, NP   BP 137/89  Pulse 105  Temp(Src) 99.2 F (37.3 C) (Oral)  Resp 16  Ht 5\' 7"  (1.702 m)  Wt 165 lb (74.844 kg)  BMI 25.84 kg/m2  SpO2 95% Physical Exam  Nursing note and vitals reviewed. Constitutional: She appears well-developed and well-nourished.  HENT:  Head: Normocephalic.  Eyes: Pupils are equal, round, and reactive to light.  Neck: Normal range of motion.  Cardiovascular: Normal rate and regular rhythm.   Pulmonary/Chest: Effort normal.  Musculoskeletal: She exhibits tenderness. She exhibits no edema.       Feet:    ED Course  Procedures (including critical care time) Labs Review Labs Reviewed - No data to display  Imaging Review Dg Toe 2nd Right  07/05/2014   CLINICAL DATA:  Crush injury to second toe  EXAM: RIGHT SECOND TOE  COMPARISON:  None.  FINDINGS: There is no evidence of fracture or dislocation. There is no evidence of arthropathy or other focal bone abnormality. Soft tissues are unremarkable.  IMPRESSION: No acute abnormality noted.   Electronically Signed   By: Inez Catalina M.D.   On: 07/05/2014 21:17     EKG Interpretation None  Xray reviewed no fracture will buddy tape and provide Rx for hydrocodone MDM   Final diagnoses:  Toe injury, right, initial encounter         Garald Balding, NP 07/05/14 2128

## 2014-07-05 NOTE — ED Notes (Signed)
Pt presents with pain and swelling due to a injury to her Right 2nd digit September 18

## 2014-07-05 NOTE — Discharge Instructions (Signed)
Buddy Taping of Toes We have taped your toes together to keep them from moving. This is called "buddy taping" since we used a part of your own body to keep the injured part still. We placed soft padding between your toes to keep them from rubbing against each other. Buddy taping will help with healing and to reduce pain. Keep your toes buddy taped together for as long as directed by your caregiver. HOME CARE INSTRUCTIONS   Raise your injured area above the level of your heart while sitting or lying down. Prop it up with pillows.  An ice pack used every twenty minutes, while awake, for the first one to two days may be helpful. Put ice in a plastic bag and put a towel between the bag and your skin.  Watch for signs that the taping is too tight. These signs may be:  Numbness of your taped toes.  Coolness of your taped toes.  Color change in the area beyond the tape.  Increased pain.  If you have any of these signs, loosen or rewrap the tape. If you need to loosen or rewrap the buddy tape, make sure you use the padding again. SEEK IMMEDIATE MEDICAL CARE IF:   You have worse pain, swelling, inflammation (soreness), drainage or bleeding after you rewrap the tape.  Any new problems occur. MAKE SURE YOU:   Understand these instructions.  Will watch your condition.  Will get help right away if you are not doing well or get worse. Document Released: 06/26/2004 Document Revised: 12/15/2011 Document Reviewed: 09/19/2008 [facility] Patient Information 2015 [place], [place]. This information is not intended to replace advice given to you by your health care provider. Make sure you discuss any questions you have with your health care provider. There is no fracture seen on your xray your toes have been tapped for comfort

## 2014-07-07 NOTE — ED Provider Notes (Signed)
Medical screening examination/treatment/procedure(s) were performed by non-physician practitioner and as supervising physician I was immediately available for consultation/collaboration.   EKG Interpretation None        Ephraim Hamburger, MD 07/07/14 [phone]

## 2014-08-07 ENCOUNTER — Encounter (HOSPITAL_COMMUNITY): Payer: Self-pay | Admitting: Emergency Medicine

## 2014-10-11 ENCOUNTER — Ambulatory Visit: Payer: Medicare Other

## 2014-10-12 ENCOUNTER — Ambulatory Visit: Payer: 59 | Attending: Anesthesiology | Admitting: Physical Therapy

## 2014-10-12 ENCOUNTER — Telehealth: Payer: Self-pay | Admitting: *Deleted

## 2014-10-12 ENCOUNTER — Encounter: Payer: Self-pay | Admitting: Physical Therapy

## 2014-10-12 ENCOUNTER — Ambulatory Visit: Payer: Medicare Other | Admitting: Physical Therapy

## 2014-10-12 DIAGNOSIS — M5441 Lumbago with sciatica, right side: Secondary | ICD-10-CM

## 2014-10-12 DIAGNOSIS — R29898 Other symptoms and signs involving the musculoskeletal system: Secondary | ICD-10-CM | POA: Diagnosis not present

## 2014-10-12 NOTE — Therapy (Signed)
[place], [place], [address] Phone: [phone]   Fax:  [phone]  Physical Therapy Evaluation  Patient Details  Name: Ashley [name] MRN: [id number] Date of Birth: 1961-09-02 Referring Provider:  Shanon Ace,*  Encounter Date: 10/12/2014      PT End of Session - 10/12/14 1101    Visit Number 1   Number of Visits 12   Date for PT Re-Evaluation 11/23/14   PT Start Time 7371   PT Stop Time 1112   PT Time Calculation (min) 57 min   Activity Tolerance Patient tolerated treatment well      Past Medical History  Diagnosis Date  . Diabetes mellitus without complication   . GERD (gastroesophageal reflux disease)   . HTN (hypertension)   . Bipolar 1 disorder     Past Surgical History  Procedure Laterality Date  . Abdominal hysterectomy      There were no vitals taken for this visit.  Visit Diagnosis:  Midline low back pain with right-sided sciatica  Weakness of right lower extremity      Subjective Assessment - 10/12/14 1017    Symptoms Patient has chronic LBP which has worsened over the years.  She has low back pain, pressure, muscle spasms in low back, difficulty with functional mobility.     Pertinent History Diabetes, smoking, bipolar   Limitations Sitting;Lifting;Standing;Walking;House hold activities   How long can you sit comfortably? 30-60 min   How long can you stand comfortably? 30 min    How long can you walk comfortably? 30 min    Diagnostic tests MRI does not have results   Patient Stated Goals Be able to stand without slouching over   Currently in Pain? Yes   Pain Score 7    Pain Location Back   Pain Orientation Mid;Lower   Pain Descriptors / Indicators Pressure  does get numb and tingling   Pain Type Chronic pain   Pain Radiating Towards RT. LE to ankle (whole leg, front and back)   Pain Onset More than a month ago  15 yrs   Aggravating Factors  standing too  long, lifting   Pain Relieving Factors pain meds, bed rest   Multiple Pain Sites No          [facility] PT Assessment - 10/12/14 1025    Assessment   Medical Diagnosis back pain   Onset Date --  chronic   Next MD Visit --  10/19/14   Prior Therapy Yes, in [facility]   Balance Screen   Has the patient fallen in the past 6 months No   Has the patient had a decrease in activity level because of a fear of falling?  No   Is the patient reluctant to leave their home because of a fear of falling?  No   Home Environment   Living Enviornment Private residence   [place] to enter   Entrance Stairs-Number of Steps --  no difficulty   Posture/Postural Control   Posture/Postural Control Postural limitations   Postural Limitations Rounded Shoulders;Forward head;Increased lumbar lordosis;Anterior pelvic tilt   AROM   Lumbar Flexion reaches to floor but needds UE to return to neutral   Lumbar Extension 25% with pain   Lumbar - Right Side Bend WNL   Lumbar - Left Side Bend WNL   Lumbar - Right Rotation WNL   Lumbar - Left Rotation WNL   Strength   Right Hip  Flexion 3+/5   Right Hip Extension 3/5   Right Hip ABduction 3-/5   Left Hip Extension 4/5   Left Hip ABduction 3+/5   Right Knee Flexion 4/5   Right Knee Extension 4/5   Left Knee Flexion --  4+/5   Left Knee Extension --  4+/5   Right Ankle Dorsiflexion 4/5   Left Ankle Dorsiflexion 5/5   Trendelenburg Test   Findings Positive   Side --  both             [facility] Adult PT Treatment/Exercise - Oct 15, 2014 1046    Knee/Hip Exercises: Sidelying   Hip ABduction Both;1 set;10 reps   Clams --  x 10 each           PT Education - 10/15/2014 1059    Education provided Yes   Education Details PT/POC, HEP and hip strength   Person(s) Educated Patient   Methods Explanation;Demonstration;Handout   Comprehension Verbalized understanding;Returned demonstration;Need further instruction          PT  Short Term Goals - 10/15/2014 1104    PT SHORT TERM GOAL #1   Title Pt will be I with HEP   Time 2   Period Weeks   Status New   PT SHORT TERM GOAL #2   Title Pt will report less pain overall when standing to do dishes, fold laundry   Time 2   Period Weeks   Status New           PT Long Term Goals - 2014-10-15 1113    PT LONG TERM GOAL #1   Title Pt. will demo proper body mechanics when lifting purse and report doing in home for housework   Time 6   Period Weeks   Status New   PT LONG TERM GOAL #2   Title Pt. will be I with more advanced HEP for core and hip   Time 6   Period Weeks   Status New   PT LONG TERM GOAL #3   Title pt will demo hip strength to 4+/5 throughout for improved gait, standing tolerance   Time 6   Period Weeks   Status New   PT LONG TERM GOAL #4   Title Pt. wil report 50% less pain overall including LE pain   Time 6   Period Weeks   Status New               Plan - 10-15-2014 1101    Clinical Impression Statement Patient has chronic low back pain and is very weak in core, hips.  She will benefit from skilled PT to improve functional mobility and improve pain, activity tolerance.    Pt will benefit from skilled therapeutic intervention in order to improve on the following deficits Decreased range of motion;Improper body mechanics;Postural dysfunction;Impaired sensation;Decreased endurance;Decreased activity tolerance;Increased fascial restricitons;Pain;Increased muscle spasms;Decreased mobility;Decreased strength   Rehab Potential Good   PT Frequency 2x / week   PT Duration 6 weeks   PT Treatment/Interventions Moist Heat;Therapeutic activities;Patient/family education;Therapeutic exercise;Ultrasound;Traction;Manual techniques;Neuromuscular re-education;Cryotherapy;Electrical Stimulation;Functional mobility training   PT Next Visit Plan review HEP and progress exercises, try traction if pain in leg.    PT Home Exercise Plan clam and hip abd    Consulted and Agree with Plan of Care Patient          G-Codes - Oct 15, 2014 1117    Functional Assessment Tool Used ODI   Functional Limitation Mobility: Walking and moving around   Mobility: Walking and Moving  Around Current Status (Y0459) At least 60 percent but less than 80 percent impaired, limited or restricted   Mobility: Walking and Moving Around Goal Status [id number]) At least 40 percent but less than 60 percent impaired, limited or restricted       Problem List Patient Active Problem List   Diagnosis Date Noted  . 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 CVA (cerebral infarction), anteriori left basal ganglia lacunar infarct 05/20/2013  . Marijuana smoker 05/20/2013  . Cigarette nicotine dependence with withdrawal 05/20/2013  . Syncope and collapse 05/20/2013  . Acute kidney injury 05/20/2013  . Dehydration 05/20/2013  . Hot flashes, menopausal 05/20/2013  . RBBB 05/20/2013  . Normocytic anemia 05/20/2013  . Thrombocytosis 05/20/2013  . Depression 05/02/2013  . Colitis presumed infectious 05/01/2013  . Diabetes mellitus 05/01/2013  . Hyperlipidemia 05/01/2013    Aldwin Micalizzi 10/12/2014, 11:21 AM  [facility] [address] [place], [place], [address] Phone: [phone]   Fax:  [phone]

## 2014-10-12 NOTE — Telephone Encounter (Signed)
appts made and printed...td 

## 2014-10-12 NOTE — Patient Instructions (Signed)
Hip Abductors (Side Lying)   Lie on side with bottom leg bent at knee, . Lift up and back __4-8__ inches in air. Keep upper hip rolled forward and knee straight. Hold _5___ seconds. Repeat _10___ times. Do __2__ sessions per day. CAUTION: Move slowly.  Copyright  VHI. All rights reserved.    Abduction: Clam (Eccentric) - Side-Lying   Lie on side with knees bent. Lift top knee, keeping feet together. Keep trunk steady. Slowly lower for 3-5 seconds. _10__ reps per set, __2_ sets per day, __5-7_ days per week.   Copyright  VHI. All rights reserved.

## 2014-10-23 ENCOUNTER — Encounter: Payer: Medicare Other | Admitting: Physical Therapy

## 2014-10-24 ENCOUNTER — Ambulatory Visit: Payer: 59 | Admitting: Physical Therapy

## 2014-10-24 DIAGNOSIS — R29898 Other symptoms and signs involving the musculoskeletal system: Secondary | ICD-10-CM

## 2014-10-24 DIAGNOSIS — M5441 Lumbago with sciatica, right side: Secondary | ICD-10-CM | POA: Diagnosis not present

## 2014-10-24 NOTE — Patient Instructions (Signed)

## 2014-10-24 NOTE — Therapy (Addendum)
[place], [place], [address] Phone: [phone]   Fax:  [phone]  Physical Therapy Treatment  Patient Details  Name: Ashley [name] MRN: [id number] Date of Birth: 19-Dec-1960 Referring Provider:  Shanon Ace,*  Encounter Date: 10/24/2014      PT End of Session - 10/24/14 1008    Visit Number 2   Number of Visits 12   Date for PT Re-Evaluation 11/23/14   PT Start Time 0935   PT Stop Time 1007   PT Time Calculation (min) 32 min   Activity Tolerance Patient limited by fatigue;Patient tolerated treatment well      Past Medical History  Diagnosis Date  . Diabetes mellitus without complication   . GERD (gastroesophageal reflux disease)   . HTN (hypertension)   . Bipolar 1 disorder     Past Surgical History  Procedure Laterality Date  . Abdominal hysterectomy      There were no vitals taken for this visit.  Visit Diagnosis:  Weakness of right lower extremity  Midline low back pain with right-sided sciatica      Subjective Assessment - 10/24/14 0938    Symptoms no  pain was 8/10   Currently in Pain? No/denies   Pain Score 8    Pain Location Back   Pain Orientation Mid;Lower   Pain Descriptors / Indicators --  throbbing, annoying   Pain Radiating Towards Rt leg   Aggravating Factors  moving too much, bending, lifting heavy bags. Sitting irritating.   Pain Relieving Factors pain medication laying down, relaxing.  Stting unsupported.   Multiple Pain Sites No                    [facility] Adult PT Treatment/Exercise - 10/24/14 0941    Posture/Postural Control   Posture/Postural Control --  Handout for posture for ADL's reviewed, pointed out suggesti   Knee/Hip Exercises: Sidelying   Hip ABduction --  cues initiallyLt reps 8. Rt7 reps then 3.  Min assist positi   Clams 7, 3 reps RT,  Lt 6, 4 reps.                PT Education - 10/24/14 1008    Education  provided Yes   Education Details ADL body mechanics   Person(s) Educated Patient   Methods Explanation;Demonstration;Handout   Comprehension Verbalized understanding          PT Short Term Goals - 10/24/14 1011    PT SHORT TERM GOAL #1   Title Pt will be I with HEP   Time 2   Period Weeks   Status On-going   PT SHORT TERM GOAL #2   Title Pt will report less pain overall when standing to do dishes, fold laundry   Time 2   Period Weeks   Status On-going           PT Long Term Goals - 10/24/14 1012    PT LONG TERM GOAL #1   Title Pt. will demo proper body mechanics when lifting purse and report doing in home for housework   Time 6   Period Weeks   Status On-going   PT LONG TERM GOAL #2   Time 6   Period Weeks   Status On-going   PT LONG TERM GOAL #3   Time 6   Period Weeks   Status Unable to assess   PT LONG TERM GOAL #4   Title Pt. wil report 50% less  pain overall including LE pain   Time 6   Period Weeks   Status On-going               Plan - 10/24/14 1009    Clinical Impression Statement shorter session at patient request.  House unlocked.  Focus on exercise, fatiques quickly, and education.  Progress toward those goals.  No leg pain today and has not had any for 2 weeks since injection.   PT Next Visit Plan answer any body mechanics questions.  Build home exercise program.   Consulted and Agree with Plan of Care Patient        Problem List Patient Active Problem List   Diagnosis Date Noted  . 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 CVA (cerebral infarction), anteriori left basal ganglia lacunar infarct 05/20/2013  . Marijuana smoker 05/20/2013  . Cigarette nicotine dependence with withdrawal 05/20/2013  . Syncope and collapse 05/20/2013  . Acute kidney injury 05/20/2013  . Dehydration 05/20/2013  . Hot flashes, menopausal 05/20/2013  . RBBB 05/20/2013  . Normocytic anemia 05/20/2013  . Thrombocytosis 05/20/2013  . Depression 05/02/2013   . Colitis presumed infectious 05/01/2013  . Diabetes mellitus 05/01/2013  . Hyperlipidemia 05/01/2013  Melvenia Needles, PTA 10/24/2014 10:13 AM Phone: [phone] Fax: [phone]   [facility] 10/24/2014, 10:13 AM  [place], [place], [address] Phone: [phone]   Fax:  [phone]   PHYSICAL THERAPY DISCHARGE SUMMARY  Visits from Start of Care: 2  Current functional level related to goals / functional outcomes: Unknown, see above, has not returned   Remaining deficits: Unknown   Education / Equipment: Body mechanics, posture, HEP Plan: Patient agrees to discharge.  Patient goals were not met. Patient is being discharged due to not returning since the last visit.  ?????   Raeford Razor, PT 08/22/2015 10:23 PM Phone: [phone] Fax: [phone]

## 2014-10-25 ENCOUNTER — Ambulatory Visit: Payer: 59 | Admitting: Physical Therapy

## 2014-10-25 DIAGNOSIS — M5441 Lumbago with sciatica, right side: Secondary | ICD-10-CM | POA: Diagnosis not present

## 2014-10-25 DIAGNOSIS — R29898 Other symptoms and signs involving the musculoskeletal system: Secondary | ICD-10-CM

## 2014-10-25 NOTE — Therapy (Signed)
[place], [place], [address] Phone: [phone]   Fax:  [phone]  Physical Therapy Treatment  Patient Details  Name: Ashley [name] MRN: [id number] Date of Birth: 1961-03-24 Referring Provider:  Shanon Ace,*  Encounter Date: 10/25/2014      PT End of Session - 10/25/14 1012    Visit Number 3   Number of Visits 12   Date for PT Re-Evaluation 11/23/14   PT Start Time 0934   PT Stop Time 1025   PT Time Calculation (min) 51 min   Activity Tolerance Patient limited by fatigue;Patient tolerated treatment well      Past Medical History  Diagnosis Date  . Diabetes mellitus without complication   . GERD (gastroesophageal reflux disease)   . HTN (hypertension)   . Bipolar 1 disorder     Past Surgical History  Procedure Laterality Date  . Abdominal hysterectomy      There were no vitals taken for this visit.  Visit Diagnosis:  Weakness of right lower extremity  Midline low back pain with right-sided sciatica      Subjective Assessment - 10/25/14 0937    Symptoms My Rt. LE is weak today.    Currently in Pain? Yes   Pain Score 5    Pain Location Back   Pain Orientation Right;Mid;Lower   Pain Type Chronic pain   Multiple Pain Sites No                    [facility] Adult PT Treatment/Exercise - 10/25/14 0939    Lumbar Exercises: Stretches   Pelvic Tilt 5 reps   Piriformis Stretch 1 rep   Piriformis Stretch Limitations --  min stretch only   Lumbar Exercises: Aerobic   Stationary Bike NuStep Level 5 UE and LE 5 min    Lumbar Exercises: Supine   Ab Set 20 reps   AB Set Limitations --  Transverse ABD    Clam 20 reps   Other Supine Lumbar Exercises Post pelvic tilt   Lumbar Exercises: Prone   Straight Leg Raise 5 reps   Straight Leg Raises Limitations --  very challenging   Other Prone Lumbar Exercises Abd set x 5   Knee/Hip Exercises: Sidelying   Hip ABduction  Strengthening;Both;1 set  x10   Clams x20 Bilat   Moist Heat Therapy   Number Minutes Moist Heat 15 Minutes   Moist Heat Location --  lumbar                PT Education - 10/25/14 1012    Education provided Yes   Education Details Transverse ABd, core   Person(s) Educated Patient   Methods Explanation;Demonstration;Handout   Comprehension Verbalized understanding;Returned demonstration;Need further instruction;Verbal cues required          PT Short Term Goals - 10/24/14 1011    PT SHORT TERM GOAL #1   Title Pt will be I with HEP   Time 2   Period Weeks   Status On-going   PT SHORT TERM GOAL #2   Title Pt will report less pain overall when standing to do dishes, fold laundry   Time 2   Period Weeks   Status On-going           PT Long Term Goals - 10/24/14 1012    PT LONG TERM GOAL #1   Title Pt. will demo proper body mechanics when lifting purse and report doing in home for housework  Time 6   Period Weeks   Status On-going   PT LONG TERM GOAL #2   Time 6   Period Weeks   Status On-going   PT LONG TERM GOAL #3   Time 6   Period Weeks   Status Unable to assess   PT LONG TERM GOAL #4   Title Pt. wil report 50% less pain overall including LE pain   Time 6   Period Weeks   Status On-going               Plan - 10/25/14 1013    Clinical Impression Statement No goals met yet, progressing towards goals, needs significant strengthening in core, hips.  Fatigues quickly.    PT Next Visit Plan Cont with core stab and hip, knee strength   PT Home Exercise Plan clam and hip abd, added TrA    Consulted and Agree with Plan of Care Patient        Problem List Patient Active Problem List   Diagnosis Date Noted  . 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 CVA (cerebral infarction), anteriori left basal ganglia lacunar infarct 05/20/2013  . Marijuana smoker 05/20/2013  . Cigarette nicotine dependence with withdrawal 05/20/2013  . Syncope and  collapse 05/20/2013  . Acute kidney injury 05/20/2013  . Dehydration 05/20/2013  . Hot flashes, menopausal 05/20/2013  . RBBB 05/20/2013  . Normocytic anemia 05/20/2013  . Thrombocytosis 05/20/2013  . Depression 05/02/2013  . Colitis presumed infectious 05/01/2013  . Diabetes mellitus 05/01/2013  . Hyperlipidemia 05/01/2013    Cate Oravec 10/25/2014, 10:15 AM  [facility] [address] [place], [place], [address] Phone: [phone]   Fax:  [phone]

## 2014-10-25 NOTE — Patient Instructions (Signed)
    Lie with hips and knees bent. Slowly inhale, and then exhale. Pull navel toward spine and tighten pelvic floor. Hold for __10_ seconds. Continue to breathe in and out during hold. Rest for _10__ seconds. Repeat __10_ times. Do __2-3_ times a day.   

## 2014-11-01 ENCOUNTER — Telehealth: Payer: Self-pay | Admitting: *Deleted

## 2014-11-01 ENCOUNTER — Ambulatory Visit: Payer: 59 | Admitting: Physical Therapy

## 2014-11-01 DIAGNOSIS — M5441 Lumbago with sciatica, right side: Secondary | ICD-10-CM

## 2014-11-01 DIAGNOSIS — R29898 Other symptoms and signs involving the musculoskeletal system: Secondary | ICD-10-CM

## 2014-11-01 NOTE — Telephone Encounter (Signed)
Pt called stating that her insurance changed and that she can not afford the copay...td

## 2014-11-01 NOTE — Therapy (Signed)
[place], [place], [address] Phone: [phone]   Fax:  [phone]  Physical Therapy Treatment  Patient Details  Name: Ashley [name] MRN: [id number] Date of Birth: [birth year] Referring Provider:  Shanon Ace,*  Encounter Date: 11/01/2014      PT End of Session - 11/01/14 0951    Visit Number 4   Number of Visits 12   Date for PT Re-Evaluation 11/23/14   PT Start Time 0928   PT Stop Time 1015   PT Time Calculation (min) 47 min   Activity Tolerance Patient tolerated treatment well      Past Medical History  Diagnosis Date  . Diabetes mellitus without complication   . GERD (gastroesophageal reflux disease)   . HTN (hypertension)   . Bipolar 1 disorder     Past Surgical History  Procedure Laterality Date  . Abdominal hysterectomy      There were no vitals taken for this visit.  Visit Diagnosis:  Weakness of right lower extremity  Midline low back pain with right-sided sciatica      Subjective Assessment - 11/01/14 0933    Symptoms Denies pain and states she vacuumed and did the dishes today!   Currently in Pain? No/denies          [facility] Adult PT Treatment/Exercise - 11/01/14 0935    Lumbar Exercises: Aerobic   Stationary Bike NuStep level 7, UE and LE 5 min    Lumbar Exercises: Supine   Ab Set 10 reps   Clam 10 reps   Clam Limitations 2 sets 1 x with green band   Heel Slides 10 reps   Bent Knee Raise 10 reps   Knee/Hip Exercises: Standing   Lateral Step Up Both;1 set;10 reps;Hand Hold: 1   Forward Step Up Both;1 set;10 reps;Hand Hold: 1   Forward Step Up Limitations --  decr knee control Rt. LE      MHP 10 min in prone to lumbar spine.  Cont to be pain free following session, just very fatigued.           PT Education - 11/01/14 0951    Education provided Yes   Education Details HEP reinforcement, core strength   Person(s) Educated Patient   Methods  Explanation;Demonstration   Comprehension Verbalized understanding;Returned demonstration          PT Short Term Goals - 11/01/14 0950    PT SHORT TERM GOAL #1   Title Pt will be I with HEP   Status Achieved   PT SHORT TERM GOAL #2   Title Pt will report less pain overall when standing to do dishes, fold laundry   Status Achieved           PT Long Term Goals - 11/01/14 0950    PT LONG TERM GOAL #1   Title Pt. will demo proper body mechanics when lifting purse and report doing in home for housework   Status On-going   PT LONG TERM GOAL #2   Title Pt. will be I with more advanced HEP for core and hip   Status On-going   PT LONG TERM GOAL #3   Title pt will demo hip strength to 4+/5 throughout for improved gait, standing tolerance   Status On-going   PT LONG TERM GOAL #4   Title Pt. wil report 50% less pain overall including LE pain   Status On-going  Plan - 11/01/14 0951    Clinical Impression Statement Patient has met STG 1-2 and seems to be progressing in function.  Rt. LE still very weak and lacks control with stairs, gait.    PT Next Visit Plan Cont with core stab and hip, knee strength   PT Home Exercise Plan cont with current   Consulted and Agree with Plan of Care Patient        Problem List Patient Active Problem List   Diagnosis Date Noted  . 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 CVA (cerebral infarction), anteriori left basal ganglia lacunar infarct 05/20/2013  . Marijuana smoker 05/20/2013  . Cigarette nicotine dependence with withdrawal 05/20/2013  . Syncope and collapse 05/20/2013  . Acute kidney injury 05/20/2013  . Dehydration 05/20/2013  . Hot flashes, menopausal 05/20/2013  . RBBB 05/20/2013  . Normocytic anemia 05/20/2013  . Thrombocytosis 05/20/2013  . Depression 05/02/2013  . Colitis presumed infectious 05/01/2013  . Diabetes mellitus 05/01/2013  . Hyperlipidemia 05/01/2013    Brayon Bielefeld 11/01/2014, 10:35  AM  [place], [place], [address] Phone: [phone]   Fax:  [phone]

## 2014-11-06 ENCOUNTER — Encounter: Payer: Medicare Other | Admitting: Physical Therapy

## 2014-11-08 ENCOUNTER — Encounter: Payer: Medicare Other | Admitting: Physical Therapy

## 2014-11-13 ENCOUNTER — Emergency Department (HOSPITAL_COMMUNITY): Payer: 59

## 2014-11-13 ENCOUNTER — Encounter (HOSPITAL_COMMUNITY): Payer: Self-pay | Admitting: Emergency Medicine

## 2014-11-13 ENCOUNTER — Emergency Department (HOSPITAL_COMMUNITY)
Admission: EM | Admit: 2014-11-13 | Discharge: 2014-11-13 | Disposition: A | Discharge status: Home / Self Care | Payer: 59 | Attending: Emergency Medicine | Admitting: Emergency Medicine

## 2014-11-13 DIAGNOSIS — E1165 Type 2 diabetes mellitus with hyperglycemia: Secondary | ICD-10-CM | POA: Diagnosis present

## 2014-11-13 DIAGNOSIS — F319 Bipolar disorder, unspecified: Secondary | ICD-10-CM | POA: Insufficient documentation

## 2014-11-13 DIAGNOSIS — Z79899 Other long term (current) drug therapy: Secondary | ICD-10-CM | POA: Diagnosis not present

## 2014-11-13 DIAGNOSIS — Z8719 Personal history of other diseases of the digestive system: Secondary | ICD-10-CM | POA: Insufficient documentation

## 2014-11-13 DIAGNOSIS — Z720 Tobacco use: Secondary | ICD-10-CM | POA: Diagnosis not present

## 2014-11-13 DIAGNOSIS — I10 Essential (primary) hypertension: Secondary | ICD-10-CM | POA: Diagnosis not present

## 2014-11-13 DIAGNOSIS — R062 Wheezing: Secondary | ICD-10-CM | POA: Insufficient documentation

## 2014-11-13 LAB — CBC
HCT: 40.9 % (ref 36.0–46.0)
Hemoglobin: 13.2 g/dL (ref 12.0–15.0)
MCH: 29.1 pg (ref 26.0–34.0)
MCHC: 32.3 g/dL (ref 30.0–36.0)
MCV: 90.1 fL (ref 78.0–100.0)
Platelets: 388 10*3/uL (ref 150–400)
RBC: 4.54 MIL/uL (ref 3.87–5.11)
RDW: 14.3 % (ref 11.5–15.5)
WBC: 8.3 10*3/uL (ref 4.0–10.5)

## 2014-11-13 LAB — COMPREHENSIVE METABOLIC PANEL
ALBUMIN: 3.7 g/dL (ref 3.5–5.2)
ALT: 23 U/L (ref 0–35)
AST: 22 U/L (ref 0–37)
Alkaline Phosphatase: 71 U/L (ref 39–117)
Anion gap: 10 (ref 5–15)
BILIRUBIN TOTAL: 0.6 mg/dL (ref 0.3–1.2)
BUN: 7 mg/dL (ref 6–23)
CO2: 23 mmol/L (ref 19–32)
Calcium: 9.5 mg/dL (ref 8.4–10.5)
Chloride: 106 mmol/L (ref 96–112)
Creatinine, Ser: 0.79 mg/dL (ref 0.50–1.10)
GFR calc Af Amer: >90 mL/min (ref >90)
GLUCOSE: 256 mg/dL — AB (ref 70–99)
Potassium: 3.7 mmol/L (ref 3.5–5.1)
SODIUM: 139 mmol/L (ref 135–145)
Total Protein: 6.4 g/dL (ref 6.0–8.3)

## 2014-11-13 LAB — URINALYSIS, ROUTINE W REFLEX MICROSCOPIC
Bilirubin Urine: NEGATIVE
Glucose, UA: >1000 mg/dL — AB
HGB URINE DIPSTICK: NEGATIVE
KETONES UR: NEGATIVE mg/dL
Leukocytes, UA: NEGATIVE
Nitrite: NEGATIVE
Protein, ur: NEGATIVE mg/dL
SPECIFIC GRAVITY, URINE: 1.023 (ref 1.005–1.030)
Urobilinogen, UA: 0.2 mg/dL (ref 0.0–1.0)
pH: 6 (ref 5.0–8.0)

## 2014-11-13 LAB — URINE MICROSCOPIC-ADD ON

## 2014-11-13 MED ORDER — SODIUM CHLORIDE 0.9 % IV BOLUS (SEPSIS)
1000.0000 mL | Freq: Once | INTRAVENOUS | Status: AC
  Administered 2014-11-13: 1000 mL via INTRAVENOUS

## 2014-11-13 NOTE — Discharge Instructions (Signed)
Your blood sugar is high.  Please follow up closely with your doctor for reevaluation of the management of your diabetes.  No evidence of diabetic ketoacidosis on today's exam.    Diabetes and Standards of Medical Care Diabetes is complicated. You may find that your diabetes team includes a dietitian, nurse, diabetes educator, eye doctor, and more. To help everyone know what is going on and to help you get the care you deserve, the following schedule of care was developed to help keep you on track. Below are the tests, exams, vaccines, medicines, education, and plans you will need. HbA1c test This test shows how well you have controlled your glucose over the past 2-3 months. It is used to see if your diabetes management plan needs to be adjusted.   It is performed at least 2 times a year if you are meeting treatment goals.  It is performed 4 times a year if therapy has changed or if you are not meeting treatment goals. Blood pressure test  This test is performed at every routine medical visit. The goal is less than 140/90 mm Hg for most people, but 130/80 mm Hg in some cases. Ask your health care provider about your goal. Dental exam  Follow up with the dentist regularly. Eye exam  If you are diagnosed with type 1 diabetes as a child, get an exam upon reaching the age of 30 years or older and have had diabetes for 3-5 years. Yearly eye exams are recommended after that initial eye exam.  If you are diagnosed with type 1 diabetes as an adult, get an exam within 5 years of diagnosis and then yearly.  If you are diagnosed with type 2 diabetes, get an exam as soon as possible after the diagnosis and then yearly. Foot care exam  Visual foot exams are performed at every routine medical visit. The exams check for cuts, injuries, or other problems with the feet.  A comprehensive foot exam should be done yearly. This includes visual inspection as well as assessing foot pulses and testing for loss of  sensation.  Check your feet nightly for cuts, injuries, or other problems with your feet. Tell your health care provider if anything is not healing. Kidney function test (urine microalbumin)  This test is performed once a year.  Type 1 diabetes: The first test is performed 5 years after diagnosis.  Type 2 diabetes: The first test is performed at the time of diagnosis.  A serum creatinine and estimated glomerular filtration rate (eGFR) test is done once a year to assess the level of chronic kidney disease (CKD), if present. Lipid profile (cholesterol, HDL, LDL, triglycerides)  Performed every 5 years for most people.  The goal for LDL is less than 100 mg/dL. If you are at high risk, the goal is less than 70 mg/dL.  The goal for HDL is 40 mg/dL-50 mg/dL for men and 50 mg/dL-60 mg/dL for women. An HDL cholesterol of 60 mg/dL or higher gives some protection against heart disease.  The goal for triglycerides is less than 150 mg/dL. Influenza vaccine, pneumococcal vaccine, and hepatitis B vaccine  The influenza vaccine is recommended yearly.  It is recommended that people with diabetes who are over 20 years old get the pneumonia vaccine. In some cases, two separate shots may be given. Ask your health care provider if your pneumonia vaccination is up to date.  The hepatitis B vaccine is also recommended for adults with diabetes. Diabetes self-management education  Education is  recommended at diagnosis and ongoing as needed. Treatment plan  Your treatment plan is reviewed at every medical visit. Document Released: 07/20/2009 Document Revised: 02/06/2014 Document Reviewed: 02/22/2013 [facility] Patient Information 2015 [place], [place]. This information is not intended to replace advice given to you by your health care provider. Make sure you discuss any questions you have with your health care provider.

## 2014-11-13 NOTE — ED Provider Notes (Signed)
CSN: [id number]     Arrival date & time 11/13/14  0917 History   First MD Initiated Contact with Patient 11/13/14 [phone]     Chief Complaint  Patient presents with  . Hyperglycemia     (Consider location/radiation/quality/duration/timing/severity/associated sxs/prior Treatment) HPI   54 year old female with history of bipolar, non-insulin-dependent diabetes, hypertension, GERD who presents for evaluation of blurred vision. Patient was sent here from her doctor's office. Patient reports she did not feel well this morning, having symptoms including abdominal crampy, feeling tired, blurry vision to both eyes, and decided to come to the doctor's office for checkup. She was found to have a blood sugar greater than 500 and subsequently sent to the ER for further evaluation. She complained of chronic low back pain worsened for the past 3 days. She takes metformin as prescribed but does not have a glucometer to check her blood sugar. She wear reading glasses. She denies having any fever, chills, URI symptoms, chest pain, shortness of breath but admits that she is having occasional smoker's cough and wheezing. She denies any dysuria, polyuria or polydipsia. No recent sickness. No other medication changes. No nausea vomiting or diarrhea. No specific treatment tried.  Past Medical History  Diagnosis Date  . Diabetes mellitus without complication   . GERD (gastroesophageal reflux disease)   . HTN (hypertension)   . Bipolar 1 disorder    Past Surgical History  Procedure Laterality Date  . Abdominal hysterectomy     Family History  Problem Relation Age of Onset  . Diabetes Mellitus II Father   . Diabetes Brother   . Heart disease Neg Hx   . Stroke Neg Hx   . Kidney disease Neg Hx    History  Substance Use Topics  . Smoking status: Current Every Day Smoker -- 1.00 packs/day for 33 years    Types: Cigarettes  . Smokeless tobacco: Not on file     Comment: pt states that she is using the E cigs and  is trying to quit  . Alcohol Use: No   OB History    Gravida Para Term Preterm AB TAB SAB Ectopic Multiple Living   4 3 2  1    1 4      Review of Systems  All other systems reviewed and are negative.     Allergies  Asa and Ibuprofen  Home Medications   Prior to Admission medications   Medication Sig Start Date End Date Taking? Authorizing Provider  HYDROcodone-acetaminophen (NORCO/VICODIN) 5-325 MG per tablet Take 2 tablets by mouth every 6 (six) hours as needed. 07/05/14   Garald Balding, NP  metFORMIN (GLUCOPHAGE) 1000 MG tablet Take 2,000 mg by mouth daily.    Historical Provider, MD  QUEtiapine (SEROQUEL) 400 MG tablet Take 800 mg by mouth at bedtime.    Historical Provider, MD   Ht 5\' 6"  (1.676 m)  Wt 158 lb (71.668 kg)  BMI 25.51 kg/m2 Physical Exam  Constitutional: She is oriented to person, place, and time. She appears well-developed and well-nourished. No distress.  Awake, alert, nontoxic appearance  HENT:  Head: Atraumatic.  Mouth/Throat: Oropharynx is clear and moist.  Eyes: Conjunctivae are normal. Right eye exhibits no discharge. Left eye exhibits no discharge.  Neck: Neck supple.  Cardiovascular: Normal rate and regular rhythm.   Pulmonary/Chest: Effort normal. No respiratory distress. She has wheezes (faint expiratory wheezes heard). She exhibits no tenderness.  Abdominal: Soft. There is no tenderness. There is no rebound.  Musculoskeletal: She exhibits no  edema or tenderness.  ROM appears intact, no obvious focal weakness  Neurological: She is alert and oriented to person, place, and time.  Mental status and motor strength appears intact  Skin: No rash noted.  Psychiatric: She has a normal mood and affect.  Nursing note and vitals reviewed.   ED Course  Procedures (including critical care time)  Patient presents for evaluations of hyperglycemia since yesterday. No obvious source of infection or potential triggers of her hyperglycemia. Workup  initiated.  12:39 PM CBG improves after IV fluid. Labs unremarkable. No source of infection noted. No evidence of DKA. Patient recommended to follow-up with PCP for further management of her diabetes. Discussed diet and exercise and medication compliant. Return precautions discussed.  Labs Review Labs Reviewed  COMPREHENSIVE METABOLIC PANEL - Abnormal; Notable for the following:    Glucose, Bld 256 (*)    All other components within normal limits  URINALYSIS, ROUTINE W REFLEX MICROSCOPIC - Abnormal; Notable for the following:    Glucose, UA >1000 (*)    All other components within normal limits  URINE MICROSCOPIC-ADD ON - Abnormal; Notable for the following:    Squamous Epithelial / LPF FEW (*)    All other components within normal limits  CBC  CBG MONITORING, ED    Imaging Review Dg Chest 2 View  11/13/2014   CLINICAL DATA:  Wheezing and cough.  Smoker.  EXAM: CHEST  2 VIEW  COMPARISON:  PA and lateral chest 05/20/2013.  FINDINGS: The lungs are clear. Heart size is normal. No pneumothorax or pleural effusion. No focal bony abnormality.  IMPRESSION: No acute disease.   Electronically Signed   By: Inge Rise M.D.   On: 11/13/2014 10:39     EKG Interpretation None      MDM   Final diagnoses:  Wheeze  Hyperglycemia due to type 2 diabetes mellitus    BP 120/75 mmHg  Pulse 90  Temp(Src) 98.2 F (36.8 C) (Oral)  Resp 18  Ht 5\' 6"  (1.676 m)  Wt 158 lb (71.668 kg)  BMI 25.51 kg/m2  SpO2 100%  I have reviewed nursing notes and vital signs. I personally reviewed the imaging tests through PACS system  I reviewed available ER/hospitalization records thought the EMR     Domenic Moras, PA-C 11/13/14 [place]. Alvino Chapel, MD 11/13/14 1700

## 2014-11-13 NOTE — ED Notes (Signed)
CBG 330

## 2014-11-13 NOTE — ED Notes (Signed)
Pt sent from [place] office for c/o hyperglycemia. Pt blood sugar was 568 at the Dr office. Pt was given 30 units of levemir at the doctor office. Pt c/o abdominal pain and change in vision in B/L eyes. Pt denies N/V.

## 2014-11-14 LAB — CBG MONITORING, ED: GLUCOSE-CAPILLARY: 330 mg/dL — AB (ref 70–99)

## 2014-12-11 ENCOUNTER — Telehealth: Payer: Self-pay | Admitting: Medical

## 2014-12-11 ENCOUNTER — Other Ambulatory Visit: Payer: Self-pay | Admitting: Family Medicine

## 2014-12-11 ENCOUNTER — Ambulatory Visit (INDEPENDENT_AMBULATORY_CARE_PROVIDER_SITE_OTHER): Payer: 59 | Admitting: Medical

## 2014-12-11 ENCOUNTER — Encounter: Payer: Self-pay | Admitting: Medical

## 2014-12-11 VITALS — BP 130/82 | HR 95 | Resp 14 | Wt 168.0 lb

## 2014-12-11 DIAGNOSIS — I10 Essential (primary) hypertension: Secondary | ICD-10-CM | POA: Diagnosis not present

## 2014-12-11 DIAGNOSIS — Z720 Tobacco use: Secondary | ICD-10-CM

## 2014-12-11 DIAGNOSIS — E1165 Type 2 diabetes mellitus with hyperglycemia: Secondary | ICD-10-CM | POA: Diagnosis not present

## 2014-12-11 DIAGNOSIS — Z8673 Personal history of transient ischemic attack (TIA), and cerebral infarction without residual deficits: Secondary | ICD-10-CM

## 2014-12-11 DIAGNOSIS — G8929 Other chronic pain: Secondary | ICD-10-CM

## 2014-12-11 DIAGNOSIS — M549 Dorsalgia, unspecified: Secondary | ICD-10-CM

## 2014-12-11 DIAGNOSIS — M542 Cervicalgia: Secondary | ICD-10-CM | POA: Diagnosis not present

## 2014-12-11 DIAGNOSIS — F17200 Nicotine dependence, unspecified, uncomplicated: Secondary | ICD-10-CM

## 2014-12-11 DIAGNOSIS — F319 Bipolar disorder, unspecified: Secondary | ICD-10-CM

## 2014-12-11 DIAGNOSIS — IMO0002 Reserved for concepts with insufficient information to code with codable children: Secondary | ICD-10-CM

## 2014-12-11 LAB — POCT GLYCOSYLATED HEMOGLOBIN (HGB A1C): Hemoglobin A1C: 9

## 2014-12-11 MED ORDER — QUETIAPINE FUMARATE 400 MG PO TABS: 800.0000 mg | ORAL_TABLET | Freq: Every day | ORAL | Status: DC

## 2014-12-11 MED ORDER — METFORMIN HCL 1000 MG PO TABS: 1000.0000 mg | ORAL_TABLET | Freq: Two times a day (BID) | ORAL | Status: DC

## 2014-12-11 MED ORDER — INSULIN DETEMIR 100 UNIT/ML FLEXPEN: 40.0000 [IU] | PEN_INJECTOR | Freq: Every day | SUBCUTANEOUS | Status: DC

## 2014-12-11 MED ORDER — DAPAGLIFLOZIN PROPANEDIOL 5 MG PO TABS: 5.0000 mg | ORAL_TABLET | Freq: Every day | ORAL | Status: DC

## 2014-12-11 MED ORDER — SIMVASTATIN 20 MG PO TABS: 20.0000 mg | ORAL_TABLET | Freq: Every day | ORAL | Status: DC

## 2014-12-11 NOTE — Telephone Encounter (Signed)
Referral order is in EPIC and they will contact the patient for appointment

## 2014-12-11 NOTE — Telephone Encounter (Signed)
Referral to diabetes nutritioinist

## 2014-12-11 NOTE — Progress Notes (Signed)
Subjective:   Ashley [name] is an 54 y.o. female who presents as a new patient. Was seeing Dr. Jimmye Norman in [place]. prior until he left.  She went to the emergency part recently for uncontrolled sugars  She reports diagnosis of diabetes around 2010.  In the past she says she wasn't on any medication for years then after re questioning she states she was on metformin only for years.  At her recent emergency department visit in February she had Levemir and the glimepiride added.  The cause her sugars have been uncontrolled she has taken extra medication not as labeled.  She is taking metformin thousand twice daily, glimepiride 2 mg twice daily, Levemir 35-40 units at night. She uses no diet discretion. She notes that common foods for her include oatmeal, toast, sausage and eggs, chicken wings, cheeseburgers from [employer], and other foods.   Patient is checking home blood sugars.   Home blood sugar records: 120- 228.  Just checking in the mornings.    Current symptoms include: none. Patient denies no problems.  Patient is checking their feet daily. Foot concerns (callous, ulcer, wound, thickened nails, toenail fungus, skin fungus, hammer toe): no problems Last dilated eye exam 06/2014 next appointment 12/22/2014  Current treatments: As above Current exercise: none Known diabetic complications: none  Has hx/o hypertension for years, but says her BPs run normal for a long time. Currently not on medication   Has hx/o stroke on prior hospital report in 2014, but she denies every having a stroke.   Was getting Seroquel from prior PCM.  Has a history of bipolar, she denies any manic episode for many years. Was seeing a psychiatrist at one point in the remote past  She is a smoker, Doesn't exercise . Joined [employer] but hasn't went yet. Has never seen a nutritionist.    At the in the visit she notes a history of chronic neck and arm and back pain, was taking hydrocodone prescribed by  pain clinic at one point been her prior primary care provider  The following portions of the patient's history were reviewed and updated as appropriate: allergies, current medications, past family history, past medical history, past social history, past surgical history and problem list.  ROS as in subjective above    Objective:    Gen: wdwn, nad, AA female Psych: Pleasant, seems to not fully grasp her medication dosing, diet, or diabetes disease process.   Heart: RRR, normal S1-S2, no murmurs Lungs clear Pulses normal Extremities no edema Neck: Nontender normal range of motion no mass no thyromegaly Arms nontender seemingly normal range of motion Neuro: Limited upper extremity exam mostly unremarkable, although she does endorse pain with shoulder range of motion in general bilaterally   Assessment:   Encounter Diagnoses  Name Primary?  . Diabetes type 2, uncontrolled Yes  . Essential hypertension   . History of stroke   . Chronic neck and back pain   . Smoker   . Bipolar affective disorder, most recent episode unspecified type, remission status unspecified       Plan:   Diabetes type 2 - . I reviewed recent emergency department report and labs. Diabetes is currently uncontrolled, using no diet discretion, using medications not as prescribed. I tried to spend some time discussing the underlying disease process, the role of various medications, the role of a healthy diabetic diet, glucose monitoring.  Continue metformin 1000 mg twice daily, stop glimepiride, add Farxiga 5 mg once daily, increase Levemir  to 40 units daily at bedtime, check sugars before meals and fasting in the morning, referral to nutritionist.   Recheck in one month, sooner when necessary  Hypertension-will add lisinopril next visit, she declined today  History of stroke-continue statin, continue to work on good blood pressure and sugar control. I reviewed hospitalization and brain MRI reports from  2014  Chronic neck, arm and back pain-advised that I could not refill chronic narcotics. We will request old records. Advise she consider revisiting pain clinic  Bipolar-continue Seroquel, consider psychiatry recheck, request old records from prior primary care  Smoker-will discuss this in more depth next visit

## 2014-12-13 ENCOUNTER — Telehealth: Payer: Self-pay | Admitting: Medical

## 2014-12-13 NOTE — Telephone Encounter (Signed)
I just saw her recently as a new patient for multiple concerns.  I advised her then that we don't prescribe chronic narcotics for pain.  I believe she mentioned that she sees a pain specialist or back specialist.  I would recommend she return to them for now regarding pain.  I think we may have requested prior record as well.

## 2014-12-13 NOTE — Telephone Encounter (Signed)
Pt called and I advised her of Ashley [name]'s instructions.  Pt also states she needs needles for the Levamir and could she come get samples.Please advise pt.

## 2014-12-13 NOTE — Telephone Encounter (Signed)
Pt is wanting pain med for low back pain

## 2014-12-13 NOTE — Telephone Encounter (Signed)
LMOM TO CB. CLS 

## 2014-12-13 NOTE — Telephone Encounter (Signed)
Pt called and stated that she is almost out of her pain medication. She is requesting a rx for narco. She also states she accidentally threw away her needles and she is requesting samples of levemir. Pt can be reached at [phone].

## 2014-12-14 NOTE — Telephone Encounter (Signed)
Sample provided per [facility].

## 2014-12-19 ENCOUNTER — Telehealth: Payer: Self-pay | Admitting: Medical

## 2014-12-19 NOTE — Telephone Encounter (Signed)
INITIATED P.A FARXIGA 

## 2014-12-20 ENCOUNTER — Other Ambulatory Visit: Payer: Self-pay | Admitting: Medical

## 2014-12-20 MED ORDER — CANAGLIFLOZIN 100 MG PO TABS: 100.0000 mg | ORAL_TABLET | Freq: Every day | ORAL | Status: DC

## 2014-12-20 NOTE — Telephone Encounter (Signed)
Change to Invokana which I sent to pharmacy which works just the same as [place].

## 2014-12-20 NOTE — Telephone Encounter (Signed)
P.A.  Wilder Glade denied, pt needs trial of bother Invokana & Jardiance Do you want to switch?

## 2014-12-27 NOTE — Telephone Encounter (Signed)
Pt informed

## 2015-01-11 ENCOUNTER — Ambulatory Visit: Payer: 59 | Admitting: Medical

## 2015-01-12 ENCOUNTER — Ambulatory Visit: Payer: Medicare Other | Admitting: Skilled Nursing Facility1

## 2015-03-19 ENCOUNTER — Other Ambulatory Visit: Payer: Self-pay | Admitting: Medical

## 2015-03-20 ENCOUNTER — Telehealth: Payer: Self-pay | Admitting: Internal Medicine

## 2015-03-20 NOTE — Telephone Encounter (Signed)
Pt was called to schedule a DM follow-up but pt does not have insurance right now so when she gets insurance she will come in for an appt

## 2015-05-29 ENCOUNTER — Other Ambulatory Visit: Payer: Self-pay

## 2015-05-29 DIAGNOSIS — Z1231 Encounter for screening mammogram for malignant neoplasm of breast: Secondary | ICD-10-CM

## 2015-06-13 ENCOUNTER — Ambulatory Visit
Admission: RE | Admit: 2015-06-13 | Discharge: 2015-06-13 | Disposition: A | Discharge status: Home / Self Care | Payer: 59 | Source: Ambulatory Visit

## 2015-06-13 ENCOUNTER — Ambulatory Visit: Payer: Medicare Other

## 2015-06-13 DIAGNOSIS — Z1231 Encounter for screening mammogram for malignant neoplasm of breast: Secondary | ICD-10-CM

## 2015-06-15 ENCOUNTER — Other Ambulatory Visit: Payer: Self-pay | Admitting: Family Medicine

## 2015-06-15 DIAGNOSIS — R928 Other abnormal and inconclusive findings on diagnostic imaging of breast: Secondary | ICD-10-CM

## 2015-06-21 ENCOUNTER — Ambulatory Visit
Admission: RE | Admit: 2015-06-21 | Discharge: 2015-06-21 | Disposition: A | Discharge status: Home / Self Care | Payer: 59 | Source: Ambulatory Visit | Attending: Family Medicine | Admitting: Family Medicine

## 2015-06-21 DIAGNOSIS — R928 Other abnormal and inconclusive findings on diagnostic imaging of breast: Secondary | ICD-10-CM

## 2015-10-19 IMAGING — CR DG CHEST 2V
2 series · 2 of 2 positions shown · non-contrast
Comparison: PA and lateral chest 05/20/2013.

CLINICAL DATA: Wheezing and cough.  Smoker.

EXAM:
CHEST  2 VIEW

[w chest pa]
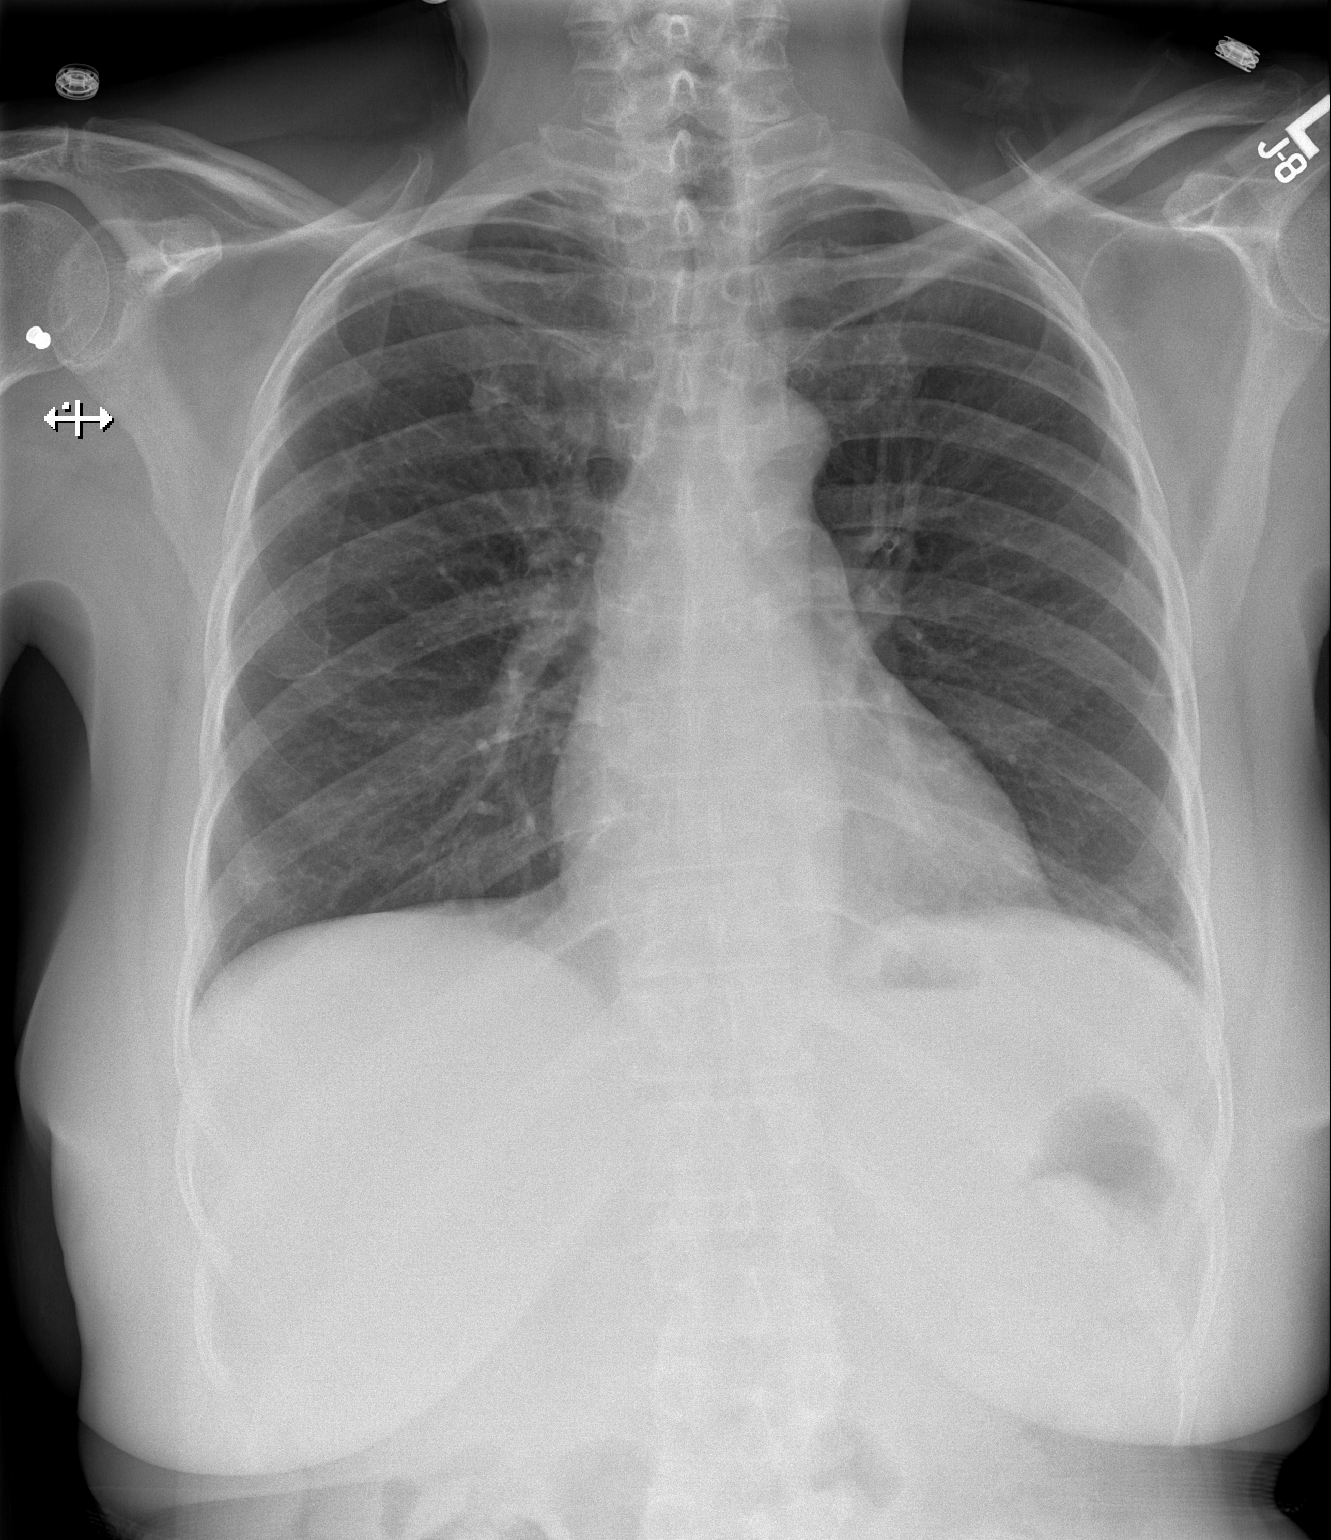

[w chest lat]
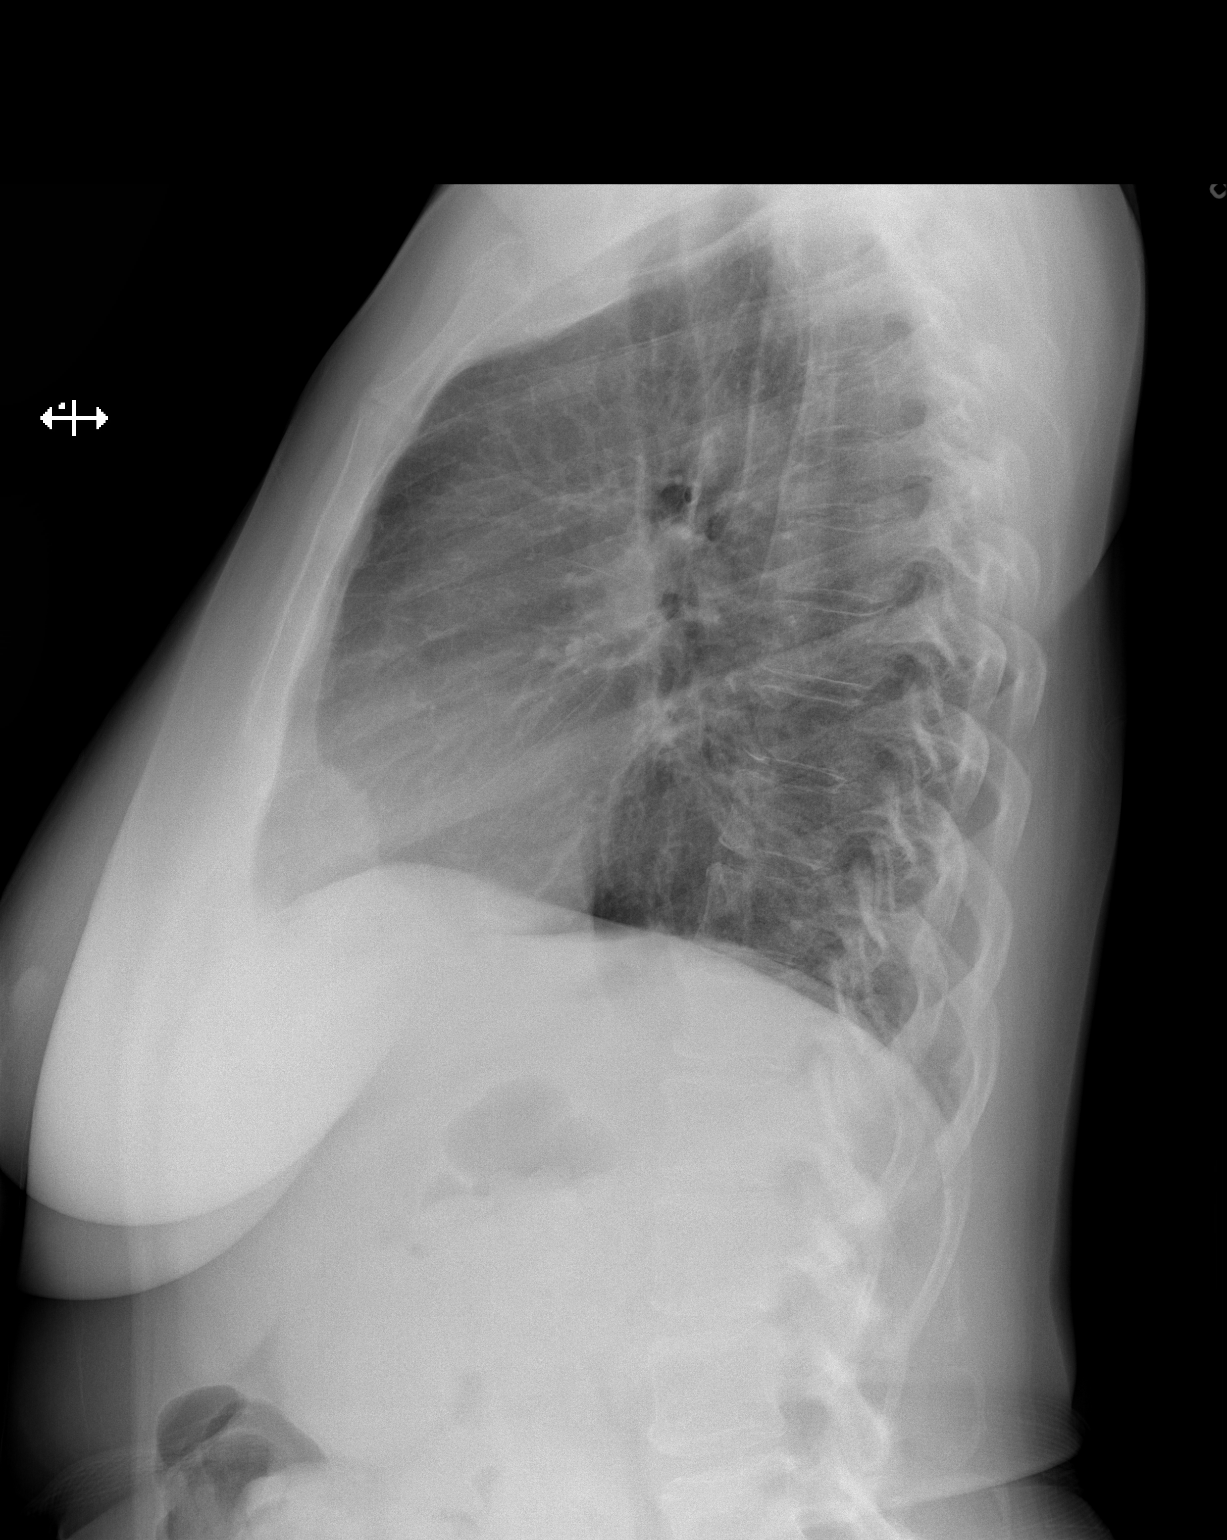

[2 of 2 positions shown; findings below may reference images not displayed]

FINDINGS: The lungs are clear. Heart size is normal. No pneumothorax or
pleural effusion. No focal bony abnormality.
IMPRESSION: No acute disease.

## 2016-01-08 ENCOUNTER — Encounter (HOSPITAL_COMMUNITY): Payer: Self-pay

## 2016-01-08 ENCOUNTER — Emergency Department (HOSPITAL_COMMUNITY)
Admission: EM | Admit: 2016-01-08 | Discharge: 2016-01-08 | Disposition: A | Discharge status: Home / Self Care | Payer: BLUE CROSS/BLUE SHIELD | Attending: Emergency Medicine | Admitting: Emergency Medicine

## 2016-01-08 ENCOUNTER — Emergency Department (HOSPITAL_COMMUNITY): Payer: BLUE CROSS/BLUE SHIELD

## 2016-01-08 DIAGNOSIS — E119 Type 2 diabetes mellitus without complications: Secondary | ICD-10-CM | POA: Insufficient documentation

## 2016-01-08 DIAGNOSIS — Z7984 Long term (current) use of oral hypoglycemic drugs: Secondary | ICD-10-CM | POA: Insufficient documentation

## 2016-01-08 DIAGNOSIS — K219 Gastro-esophageal reflux disease without esophagitis: Secondary | ICD-10-CM | POA: Diagnosis not present

## 2016-01-08 DIAGNOSIS — F319 Bipolar disorder, unspecified: Secondary | ICD-10-CM | POA: Diagnosis not present

## 2016-01-08 DIAGNOSIS — F17210 Nicotine dependence, cigarettes, uncomplicated: Secondary | ICD-10-CM | POA: Diagnosis not present

## 2016-01-08 DIAGNOSIS — Z79899 Other long term (current) drug therapy: Secondary | ICD-10-CM | POA: Insufficient documentation

## 2016-01-08 DIAGNOSIS — M25572 Pain in left ankle and joints of left foot: Secondary | ICD-10-CM | POA: Insufficient documentation

## 2016-01-08 DIAGNOSIS — I10 Essential (primary) hypertension: Secondary | ICD-10-CM | POA: Insufficient documentation

## 2016-01-08 DIAGNOSIS — Z794 Long term (current) use of insulin: Secondary | ICD-10-CM | POA: Insufficient documentation

## 2016-01-08 MED ORDER — OXYCODONE-ACETAMINOPHEN 5-325 MG PO TABS
1.0000 | ORAL_TABLET | Freq: Once | ORAL | Status: AC
  Administered 2016-01-08: 1 via ORAL
  Filled 2016-01-08: qty 1

## 2016-01-08 MED ORDER — KETOROLAC TROMETHAMINE 60 MG/2ML IM SOLN
60.0000 mg | Freq: Once | INTRAMUSCULAR | Status: AC
  Administered 2016-01-08: 60 mg via INTRAMUSCULAR
  Filled 2016-01-08: qty 2

## 2016-01-08 MED ORDER — NAPROXEN 500 MG PO TABS: 500.0000 mg | ORAL_TABLET | Freq: Two times a day (BID) | ORAL | Status: DC

## 2016-01-08 NOTE — Discharge Instructions (Signed)
You have been seen today for ankle injury and pain. Your imaging showed no abnormalities. Naproxen or ibuprofen for pain and inflammation. Use the brace for support, elevate whenever possible, and apply ice for no more than 15 minutes at a time. Follow up with PCP as needed should symptoms continue. Return to ED should symptoms worsen.

## 2016-01-08 NOTE — ED Notes (Signed)
Patient transported to X-ray 

## 2016-01-08 NOTE — ED Provider Notes (Signed)
CSN: [id number]     Arrival date & time 01/08/16  1654 History  By signing my name below, I, Nicole Kindred, attest that this documentation has been prepared under the direction and in the presence of Anna Maria, PA-C.  Electronically Signed: Nicole Kindred, ED Scribe 01/08/2016 at 8:50 PM.    Chief Complaint  Patient presents with  . Ankle Pain     The history is provided by the patient. No language interpreter was used.   HPI Comments: Ashley [name] is a 55 y.o. female who presents to the Emergency Department complaining of sudden onset, left ankle pain, onset this morning after stepping off a curb and twisting her ankle. Pt reports associated left foot pain and swelling to her left ankle and left foot. No other associated symptoms noted. Pt has not used any treatment for the symptoms PTA. She denies pain to any other areas. Patient denies numbness and any other injuries.  Past Medical History  Diagnosis Date  . Diabetes mellitus without complication ([place])   . GERD (gastroesophageal reflux disease)   . HTN (hypertension)   . Bipolar 1 disorder [facility])    Past Surgical History  Procedure Laterality Date  . Abdominal hysterectomy     Family History  Problem Relation Age of Onset  . Diabetes Mellitus II Father   . Diabetes Brother   . Heart disease Neg Hx   . Stroke Neg Hx   . Kidney disease Neg Hx    Social History  Substance Use Topics  . Smoking status: Current Every Day Smoker -- 1.00 packs/day for 33 years    Types: Cigarettes  . Smokeless tobacco: None     Comment: pt states that she is using the E cigs and is trying to quit  . Alcohol Use: No   OB History    Gravida Para Term Preterm AB TAB SAB Ectopic Multiple Living   4 3 2  1    1 4      Review of Systems  Constitutional: Negative for fever.  Musculoskeletal: Positive for joint swelling and arthralgias.       Left ankle and foot pain noted.  Left ankle and foot swelling noted.  Skin: Negative for  color change and pallor.  Neurological: Negative for weakness and numbness.    Allergies  Review of patient's allergies indicates no active allergies.  Home Medications   Prior to Admission medications   Medication Sig Start Date End Date Taking? Authorizing Provider  canagliflozin (INVOKANA) 100 MG TABS tablet Take 1 tablet (100 mg total) by mouth daily. 12/20/14   Camelia Eng Tysinger, PA-C  glimepiride (AMARYL) 2 MG tablet Take 2 mg by mouth daily with breakfast.    Historical Provider, MD  HYDROcodone-acetaminophen (NORCO) 10-325 MG per tablet Take 1 tablet by mouth 2 (two) times daily. 10/19/14   Historical Provider, MD  Insulin Detemir (LEVEMIR) 100 UNIT/ML Pen Inject 40 Units into the skin daily at 10 pm. 12/11/14   Camelia Eng Tysinger, PA-C  metFORMIN (GLUCOPHAGE) 1000 MG tablet Take 2,000 mg by mouth daily.    Historical Provider, MD  metFORMIN (GLUCOPHAGE) 1000 MG tablet Take 1 tablet (1,000 mg total) by mouth 2 (two) times daily with a meal. 12/11/14   Camelia Eng Tysinger, PA-C  naproxen (NAPROSYN) 500 MG tablet Take 1 tablet (500 mg total) by mouth 2 (two) times daily. 01/08/16   Phat Dalton C Waunetta Riggle, PA-C  QUEtiapine (SEROQUEL) 400 MG tablet TAKE 2 TABLETS (800 MG TOTAL) BY  MOUTH AT BEDTIME. 03/19/15   Camelia Eng Tysinger, PA-C  simvastatin (ZOCOR) 20 MG tablet Take 1 tablet (20 mg total) by mouth daily. 12/11/14   Camelia Eng Tysinger, PA-C   BP 162/96 mmHg  Pulse 79  Temp(Src) 98.8 F (37.1 C) (Oral)  Resp 16  SpO2 100% Physical Exam  Constitutional: She is oriented to person, place, and time. She appears well-developed and well-nourished. No distress.  HENT:  Head: Normocephalic and atraumatic.  Eyes: Conjunctivae are normal.  Neck: Neck supple.  Cardiovascular: Normal rate and regular rhythm.   Pulmonary/Chest: Effort normal.  Abdominal: She exhibits no distension.  Musculoskeletal: She exhibits tenderness.  TTP and swelling to left lateral foot and ankle.   Neurological: She is alert and oriented  to person, place, and time.  No sensory deficits. Strength 5 out of 5.  Skin: Skin is warm and dry. She is not diaphoretic.  Psychiatric: She has a normal mood and affect.  Nursing note and vitals reviewed.   ED Course  Procedures (including critical care time) DIAGNOSTIC STUDIES: Oxygen Saturation is 100% on RA, normal by my interpretation.    COORDINATION OF CARE: 5:19 PM-Discussed treatment plan which includes percocet/roxicet and toradol with pt at bedside and pt agreed to plan.   Labs Review Labs Reviewed - No data to display  Imaging Review Dg Ankle Complete Left  01/08/2016  CLINICAL DATA:  55 year old who sustained a twisting injury to the left ankle and foot earlier today, complaining of lateral pain and swelling at at the ankle and plantar foot pain at the arch. Initial encounter. EXAM: LEFT ANKLE COMPLETE - 3+ VIEW COMPARISON:  Left tibia-fibula x-rays 05/20/2013. FINDINGS: No evidence of acute fracture or dislocation. Accessory ossicle or the sequelae of a prior fracture at the lateral malleolus, unchanged. Ankle mortise intact with well preserved joint space. Lucent lesion in the anterior distal tibial metadiaphysis with slight endosteal scalloping and associated calcification, unchanged. No new osseous findings. Moderate generalized soft tissue swelling, especially laterally. IMPRESSION: No acute osseous abnormality. Electronically Signed   By: Evangeline Dakin M.D.   On: 01/08/2016 17:59   Dg Foot Complete Left  01/08/2016  CLINICAL DATA:  55 year old who sustained a twisting injury to the left ankle and foot earlier today, complaining of lateral pain and swelling at at the ankle and plantar foot pain at the arch. Initial encounter. EXAM: LEFT FOOT - COMPLETE 3+ VIEW COMPARISON:  None. FINDINGS: No evidence of acute fracture or dislocation. Moderate to severe narrowing of the 1st MTP joint space with subchondral cysts in the head of the 1st metatarsal but no evidence of marginal  erosions. Remaining joint spaces well preserved. Bone mineral density well-preserved. IMPRESSION: 1. No acute osseous abnormality. 2. 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 Electronically Signed   By: Evangeline Dakin M.D.   On: 01/08/2016 17:55      EKG Interpretation None      MDM  Treatment includes percocet/roxicet and toradol.   Final diagnoses:  Ankle pain, left    Ashley [name] presents with left ankle injury and pain that occurred this morning.  Patient's presentation is consistent with a sprain versus fracture. X-ray shows no fracture or dislocation. Patient placed in a brace and given crutches as well as pain management. Home care instructions and return precautions discussed. Patient to follow up with PCP should symptoms continue. Patient voiced understanding of these instructions and is comfortable with discharge.  I personally performed the services described in  this documentation, which was scribed in my presence. The recorded information has been reviewed and is accurate.   Lorayne Bender, PA-C 01/08/16 2050  Charlesetta Shanks, MD 01/09/16 [phone]

## 2016-01-08 NOTE — ED Notes (Signed)
Pt reports she was walking and somehow twisted her left foot today. She went to work today despite injury but pain is getting worse. She reports it is painful to walk and has noticed her ankle starting to swell.

## 2016-03-24 ENCOUNTER — Emergency Department (HOSPITAL_COMMUNITY)
Admission: EM | Admit: 2016-03-24 | Discharge: 2016-03-24 | Disposition: A | Discharge status: Home / Self Care | Payer: BLUE CROSS/BLUE SHIELD | Attending: Emergency Medicine | Admitting: Emergency Medicine

## 2016-03-24 ENCOUNTER — Encounter (HOSPITAL_COMMUNITY): Payer: Self-pay | Admitting: Emergency Medicine

## 2016-03-24 DIAGNOSIS — E119 Type 2 diabetes mellitus without complications: Secondary | ICD-10-CM | POA: Diagnosis not present

## 2016-03-24 DIAGNOSIS — I10 Essential (primary) hypertension: Secondary | ICD-10-CM | POA: Diagnosis not present

## 2016-03-24 DIAGNOSIS — F17210 Nicotine dependence, cigarettes, uncomplicated: Secondary | ICD-10-CM | POA: Insufficient documentation

## 2016-03-24 DIAGNOSIS — M5442 Lumbago with sciatica, left side: Secondary | ICD-10-CM | POA: Insufficient documentation

## 2016-03-24 DIAGNOSIS — Z794 Long term (current) use of insulin: Secondary | ICD-10-CM | POA: Diagnosis not present

## 2016-03-24 DIAGNOSIS — Z7984 Long term (current) use of oral hypoglycemic drugs: Secondary | ICD-10-CM | POA: Diagnosis not present

## 2016-03-24 DIAGNOSIS — M545 Low back pain: Secondary | ICD-10-CM | POA: Diagnosis present

## 2016-03-24 MED ORDER — KETOROLAC TROMETHAMINE 60 MG/2ML IM SOLN
60.0000 mg | Freq: Once | INTRAMUSCULAR | Status: AC
  Administered 2016-03-24: 60 mg via INTRAMUSCULAR
  Filled 2016-03-24: qty 2

## 2016-03-24 MED ORDER — GABAPENTIN 300 MG PO CAPS: 300.0000 mg | ORAL_CAPSULE | Freq: Three times a day (TID) | ORAL | Status: DC

## 2016-03-24 MED ORDER — PREDNISONE 20 MG PO TABS
60.0000 mg | ORAL_TABLET | Freq: Once | ORAL | Status: AC
  Administered 2016-03-24: 60 mg via ORAL
  Filled 2016-03-24: qty 3

## 2016-03-24 MED ORDER — PREDNISONE 10 MG PO TABS: 20.0000 mg | ORAL_TABLET | Freq: Two times a day (BID) | ORAL | Status: DC

## 2016-03-24 MED ORDER — LIDOCAINE 5 % EX PTCH: 1.0000 | MEDICATED_PATCH | CUTANEOUS | Status: DC

## 2016-03-24 MED ORDER — LIDOCAINE 5 % EX PTCH
1.0000 | MEDICATED_PATCH | CUTANEOUS | Status: DC
  Administered 2016-03-24: 1 via TRANSDERMAL
  Filled 2016-03-24: qty 1

## 2016-03-24 MED ORDER — METHOCARBAMOL 500 MG PO TABS: 500.0000 mg | ORAL_TABLET | Freq: Two times a day (BID) | ORAL | Status: DC

## 2016-03-24 MED ORDER — OXYCODONE-ACETAMINOPHEN 5-325 MG PO TABS: 1.0000 | ORAL_TABLET | Freq: Once | ORAL | Status: DC

## 2016-03-24 MED ORDER — GABAPENTIN 300 MG PO CAPS
300.0000 mg | ORAL_CAPSULE | Freq: Once | ORAL | Status: AC
Start: 2016-03-24 — End: 2016-03-24
  Administered 2016-03-24: 300 mg via ORAL
  Filled 2016-03-24: qty 1

## 2016-03-24 MED ORDER — NAPROXEN 500 MG PO TABS: 500.0000 mg | ORAL_TABLET | Freq: Two times a day (BID) | ORAL | Status: DC

## 2016-03-24 NOTE — ED Provider Notes (Signed)
CSN: [id number]     Arrival date & time 03/24/16  [id number] History  By signing my name below, I, Ashley [name], attest that this documentation has been prepared under the direction and in the presence of Arlean Hopping, PA-C Electronically Signed: Ladene Artist, ED Scribe 03/24/2016 at 11:08 AM.   Chief Complaint  Patient presents with  . Back Pain   The history is provided by the patient. No language interpreter was used.   HPI Comments: Ashley [name] is a 55 y.o. female who presents to the Emergency Department complaining of gradually worsening low back pain that radiates into the posterior left leg for the past 3 days. Pt states that pain is exacerbated with ambulating. 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 She has been evaluated and treated in the past for similar back pain by a orthopedic surgeon and a pain clinic, but stopped going to last year. No known drug allergies. No h/o kidney related diseases. Patient denies fever/chills, neuro deficits, trauma, bowel or bladder abnormalities, or any other complaints.  Past Medical History  Diagnosis Date  . Diabetes mellitus without complication ([place])   . GERD (gastroesophageal reflux disease)   . HTN (hypertension)   . Bipolar 1 disorder [facility])    Past Surgical History  Procedure Laterality Date  . Abdominal hysterectomy     Family History  Problem Relation Age of Onset  . Diabetes Mellitus II Father   . Diabetes Brother   . Heart disease Neg Hx   . Stroke Neg Hx   . Kidney disease Neg Hx    Social History  Substance Use Topics  . Smoking status: Current Every Day Smoker -- 1.00 packs/day for 33 years    Types: Cigarettes  . Smokeless tobacco: None     Comment: pt states that she is using the E cigs and is trying to quit  . Alcohol Use: No   OB History    Gravida Para Term Preterm AB TAB SAB Ectopic Multiple Living   4 3 2  1    1 4       Review of Systems  Constitutional: Negative for fever and chills.  Musculoskeletal: Positive for back pain. Negative for neck pain.  Neurological: Negative for dizziness, weakness, light-headedness and numbness.  All other systems reviewed and are negative.  Allergies  Review of patient's allergies indicates no active allergies.  Home Medications   Prior to Admission medications   Medication Sig Start Date End Date Taking? Authorizing Provider  canagliflozin (INVOKANA) 100 MG TABS tablet Take 1 tablet (100 mg total) by mouth daily. 12/20/14   Camelia Eng Tysinger, PA-C  glimepiride (AMARYL) 2 MG tablet Take 2 mg by mouth daily with breakfast.    Historical Provider, MD  HYDROcodone-acetaminophen (NORCO) 10-325 MG per tablet Take 1 tablet by mouth 2 (two) times daily. 10/19/14   Historical Provider, MD  Insulin Detemir (LEVEMIR) 100 UNIT/ML Pen Inject 40 Units into the skin daily at 10 pm. 12/11/14   Camelia Eng Tysinger, PA-C  metFORMIN (GLUCOPHAGE) 1000 MG tablet Take 2,000 mg by mouth daily.    Historical Provider, MD  metFORMIN (GLUCOPHAGE) 1000 MG tablet Take 1 tablet (1,000 mg total) by mouth 2 (two) times daily with a meal. 12/11/14   Camelia Eng Tysinger, PA-C  naproxen (NAPROSYN) 500 MG tablet Take 1 tablet (500 mg total) by mouth 2 (two)  times daily. 01/08/16   Shawn C Joy, PA-C  QUEtiapine (SEROQUEL) 400 MG tablet TAKE 2 TABLETS (800 MG TOTAL) BY MOUTH AT BEDTIME. 03/19/15   Camelia Eng Tysinger, PA-C  simvastatin (ZOCOR) 20 MG tablet Take 1 tablet (20 mg total) by mouth daily. 12/11/14   Camelia Eng Tysinger, PA-C   BP 112/89 mmHg  Pulse 78  Temp(Src) 99.2 F (37.3 C) (Oral)  Resp 16  Ht 5\' 7"  (1.702 m)  Wt 170 lb (77.111 kg)  BMI 26.62 kg/m2  SpO2 100% Physical Exam  Constitutional: She is oriented to person, place, and time. She appears well-developed and well-nourished. No distress.  HENT:  Head: Normocephalic and atraumatic.  Eyes: Conjunctivae are normal.  Neck: Normal range of motion. Neck  supple.  Cardiovascular: Normal rate, regular rhythm and intact distal pulses.   Pulmonary/Chest: Effort normal. No respiratory distress.  Abdominal: Soft. There is no tenderness. There is no guarding.  Musculoskeletal: Normal range of motion. She exhibits no edema or tenderness.  Tenderness to L sacral musculature. Full ROM in all extremities and spine. No paraspinal tenderness.   Lymphadenopathy:    She has no cervical adenopathy.  Neurological: She is alert and oriented to person, place, and time. She has normal reflexes.  No sensory deficits. Strength 5/5 in all extremities. Hesitant gait due to pain. Coordination intact.  Skin: Skin is warm and dry. She is not diaphoretic.  Psychiatric: She has a normal mood and affect. Her behavior is normal.  Nursing note and vitals reviewed.  ED Course  Procedures (including critical care time) DIAGNOSTIC STUDIES: Oxygen Saturation is 100% on RA, normal by my interpretation.    COORDINATION OF CARE: 10:59 AM-Discussed treatment plan which includes Lidoderm, Toradol injection, Prednisone, gabapentin with pt at bedside and pt agreed to plan.   MDM   Final diagnoses:  Left-sided low back pain with left-sided sciatica    Ashley [name] presents with acute on chronic left lower back pain with sciatica symptoms.  Suspect sciatica. No red flag symptoms. No neuro or functional deficits. Upon reevaluation, patient states her pain has significantly improved. Ambulated without difficulty in the ED. patient follow up with orthopedics for further management. Home care and return precautions discussed. Patient voiced understanding of these instructions and is comfortable with discharge.  Filed Vitals:   03/24/16 0952 03/24/16 1204  BP: 112/89 128/80  Pulse: 78 78  Temp: 99.2 F (37.3 C)   TempSrc: Oral   Resp: 16 18  Height: 5\' 7"  (1.702 m)   Weight: 77.111 kg   SpO2: 100% 100%     I personally performed the services described in this  documentation, which was scribed in my presence. The recorded information has been reviewed and is accurate.   Lorayne Bender, PA-C 03/24/16 [place], MD 03/25/16 [phone]

## 2016-03-24 NOTE — ED Notes (Signed)
Pt reports she stands on concrete  Floors at work and this causes her back pain.

## 2016-03-24 NOTE — ED Notes (Signed)
Pt ambulated in hall without difficulty  States walking improves pain

## 2016-03-24 NOTE — Discharge Instructions (Signed)
You have been seen today for lower back pain. Your symptoms are consistent with sciatica. No medications can fix this problem, only treat the symptoms. Back exercises and physical therapy are very important. Refer to the attached literature for back exercises that you should incorporate into your day. Follow-up with orthopedics as soon as possible. Follow up with PCP as needed. Return to ED should symptoms worsen.  Expect your soreness to increase over the next 2-3 days. Take it easy, but do not lay around too much as this may make the stiffness worse. Take 500 mg of naproxen every 12 hours or 800 mg of ibuprofen every 8 hours for the next 3 days. Take these medications with food to avoid upset stomach. Robaxin is a muscle relaxer and may help loosen stiff muscles. Neurontin for nerve-related pain. Do not take the Robaxin or Neurontin while driving or performing other dangerous activities, until you know how it will affect you.

## 2016-03-24 NOTE — ED Notes (Signed)
Declined W/C at D/C and was escorted to lobby by RN. 

## 2016-03-24 NOTE — ED Notes (Signed)
To ED via Private vehicle with c/o back pain radiating from left buttock down leg-- hurts to walk--

## 2016-04-01 ENCOUNTER — Emergency Department (HOSPITAL_COMMUNITY): Payer: BLUE CROSS/BLUE SHIELD

## 2016-04-01 ENCOUNTER — Emergency Department (HOSPITAL_COMMUNITY)
Admission: EM | Admit: 2016-04-01 | Discharge: 2016-04-01 | Disposition: A | Discharge status: Home / Self Care | Payer: BLUE CROSS/BLUE SHIELD | Attending: Emergency Medicine | Admitting: Emergency Medicine

## 2016-04-01 ENCOUNTER — Encounter (HOSPITAL_COMMUNITY): Payer: Self-pay | Admitting: Nurse Practitioner

## 2016-04-01 DIAGNOSIS — E119 Type 2 diabetes mellitus without complications: Secondary | ICD-10-CM | POA: Insufficient documentation

## 2016-04-01 DIAGNOSIS — Z7984 Long term (current) use of oral hypoglycemic drugs: Secondary | ICD-10-CM | POA: Insufficient documentation

## 2016-04-01 DIAGNOSIS — Z794 Long term (current) use of insulin: Secondary | ICD-10-CM | POA: Insufficient documentation

## 2016-04-01 DIAGNOSIS — M4800 Spinal stenosis, site unspecified: Secondary | ICD-10-CM

## 2016-04-01 DIAGNOSIS — M5431 Sciatica, right side: Secondary | ICD-10-CM

## 2016-04-01 DIAGNOSIS — Z79899 Other long term (current) drug therapy: Secondary | ICD-10-CM | POA: Diagnosis not present

## 2016-04-01 DIAGNOSIS — I10 Essential (primary) hypertension: Secondary | ICD-10-CM | POA: Diagnosis not present

## 2016-04-01 DIAGNOSIS — F17210 Nicotine dependence, cigarettes, uncomplicated: Secondary | ICD-10-CM | POA: Diagnosis not present

## 2016-04-01 DIAGNOSIS — M5441 Lumbago with sciatica, right side: Secondary | ICD-10-CM | POA: Insufficient documentation

## 2016-04-01 DIAGNOSIS — M545 Low back pain: Secondary | ICD-10-CM | POA: Diagnosis present

## 2016-04-01 DIAGNOSIS — M549 Dorsalgia, unspecified: Secondary | ICD-10-CM

## 2016-04-01 MED ORDER — MORPHINE SULFATE (PF) 4 MG/ML IV SOLN
4.0000 mg | Freq: Once | INTRAVENOUS | Status: AC
  Administered 2016-04-01: 4 mg via INTRAVENOUS
  Filled 2016-04-01: qty 1

## 2016-04-01 MED ORDER — PREDNISONE 10 MG (21) PO TBPK: 10.0000 mg | ORAL_TABLET | Freq: Every day | ORAL | Status: DC

## 2016-04-01 MED ORDER — OXYCODONE-ACETAMINOPHEN 5-325 MG PO TABS: 1.0000 | ORAL_TABLET | Freq: Four times a day (QID) | ORAL | Status: DC | PRN

## 2016-04-01 MED ORDER — KETOROLAC TROMETHAMINE 15 MG/ML IJ SOLN
15.0000 mg | Freq: Once | INTRAMUSCULAR | Status: AC
  Administered 2016-04-01: 15 mg via INTRAMUSCULAR
  Filled 2016-04-01: qty 1

## 2016-04-01 NOTE — ED Notes (Signed)
Pt ambulated to doorway without difficulty with use of cane. PA at bedside.

## 2016-04-01 NOTE — ED Provider Notes (Signed)
CSN: [id number]     Arrival date & time 04/01/16  1300 History  By signing my name below, I, Ashley [name], attest that this documentation has been prepared under the direction and in the presence of [employer], PA-C. Electronically Signed: Stephania [name], ED Scribe. 04/01/2016. 2:25 PM.    Chief Complaint  Patient presents with  . Back Pain   The history is provided by the patient. No language interpreter was used.   HPI Comments: Ashley [name] is a 55 y.o. female with a history of DM, GERD, HTN, and bipolar 1 disorder, who presents to the Emergency Department complaining of atraumatic, constant, worsening, 10/10 back pain radiating into her posterior left leg that began 3 weeks ago when she woke up in the morning. Patient also complains of associated numbness in her posterior left leg. Patient states she works at a very physical job working on a [employer] and suspects the job may have contributed to her pain. However, she denies any acute trauma or injury. Patient states she has an appointment with her PCP tomorrow, but her family brought her in today because her pain has been worsening and patient has had difficulty ambulating or performing activities of daily living secondary to her pain. Patient states she has been able to ambulate with a cane, with difficulty. However, she also notes she has not been able to stand for longer than 1 minute due to feelings of weakness in her leg. She states she had similar back pain radiating into her right leg several years ago, although it had resolved, and she had no significant back pain until recently. She states her symptoms this episode are more severe than her episode of back pain several years ago. Per chart review, patient was seen on 03/24/16 for the same lower back pain with posterior left leg radiation. She was diagnosed with sciatica and discharged with prescriptions for naproxen, Lidoderm, prednisone, Robaxin, and gabapentin. However, she states she  has not experienced any significant relief with the medications. Patient was also referred to orthopedics for further management, but she states she lost the referral papers and was unable to schedule an appointment.  Her family states the last time she was seen in the ED on 03/24/16, she had no imaging done and requests further imaging. Patient denies saddle anesthesia, bowel or bladder incontinence, fever, or abdominal pain.     Past Medical History  Diagnosis Date  . Diabetes mellitus without complication ([place])   . GERD (gastroesophageal reflux disease)   . HTN (hypertension)   . Bipolar 1 disorder [facility])    Past Surgical History  Procedure Laterality Date  . Abdominal hysterectomy     Family History  Problem Relation Age of Onset  . Diabetes Mellitus II Father   . Diabetes Brother   . Heart disease Neg Hx   . Stroke Neg Hx   . Kidney disease Neg Hx    Social History  Substance Use Topics  . Smoking status: Current Every Day Smoker -- 1.00 packs/day for 33 years    Types: Cigarettes  . Smokeless tobacco: None     Comment: pt states that she is using the E cigs and is trying to quit  . Alcohol Use: No   OB History    Gravida Para Term Preterm AB TAB SAB Ectopic Multiple Living   4 3 2  1    1 4      Review of Systems  Constitutional: Negative for fever.  Gastrointestinal: Negative  for abdominal pain.       Negative for bowel incontinence.    Genitourinary:       Negative for bladder incontinence.    Musculoskeletal: Positive for back pain.  Neurological: Positive for weakness and numbness.      Allergies  Review of patient's allergies indicates no known allergies.  Home Medications   Prior to Admission medications   Medication Sig Start Date End Date Taking? Authorizing Provider  canagliflozin (INVOKANA) 100 MG TABS tablet Take 1 tablet (100 mg total) by mouth daily. 12/20/14   Camelia Eng Tysinger, PA-C  gabapentin (NEURONTIN) 300 MG capsule Take 1 capsule (300 mg  total) by mouth 3 (three) times daily. 03/24/16   Shawn C Joy, PA-C  glimepiride (AMARYL) 2 MG tablet Take 2 mg by mouth daily with breakfast.    Historical Provider, MD  HYDROcodone-acetaminophen (NORCO) 10-325 MG per tablet Take 1 tablet by mouth 2 (two) times daily. 10/19/14   Historical Provider, MD  Insulin Detemir (LEVEMIR) 100 UNIT/ML Pen Inject 40 Units into the skin daily at 10 pm. 12/11/14   Camelia Eng Tysinger, PA-C  lidocaine (LIDODERM) 5 % Place 1 patch onto the skin daily. Remove & Discard patch within 12 hours or as directed by MD 03/24/16   Lorayne Bender, PA-C  metFORMIN (GLUCOPHAGE) 1000 MG tablet Take 2,000 mg by mouth daily.    Historical Provider, MD  metFORMIN (GLUCOPHAGE) 1000 MG tablet Take 1 tablet (1,000 mg total) by mouth 2 (two) times daily with a meal. 12/11/14   Camelia Eng Tysinger, PA-C  methocarbamol (ROBAXIN) 500 MG tablet Take 1 tablet (500 mg total) by mouth 2 (two) times daily. 03/24/16   Shawn C Joy, PA-C  naproxen (NAPROSYN) 500 MG tablet Take 1 tablet (500 mg total) by mouth 2 (two) times daily. 01/08/16   Shawn C Joy, PA-C  naproxen (NAPROSYN) 500 MG tablet Take 1 tablet (500 mg total) by mouth 2 (two) times daily. 03/24/16   Shawn C Joy, PA-C  predniSONE (DELTASONE) 10 MG tablet Take 2 tablets (20 mg total) by mouth 2 (two) times daily with a meal. 03/24/16   Shawn C Joy, PA-C  QUEtiapine (SEROQUEL) 400 MG tablet TAKE 2 TABLETS (800 MG TOTAL) BY MOUTH AT BEDTIME. 03/19/15   Camelia Eng Tysinger, PA-C  simvastatin (ZOCOR) 20 MG tablet Take 1 tablet (20 mg total) by mouth daily. 12/11/14   Camelia Eng Tysinger, PA-C   BP 131/77 mmHg  Pulse 89  Temp(Src) 98.7 F (37.1 C) (Oral)  Resp 20  SpO2 100%   Physical Exam  Constitutional: She is oriented to person, place, and time. She appears well-developed and well-nourished. No distress.  HENT:  Head: Normocephalic.  Neck: Normal range of motion. Neck supple.  Pulmonary/Chest: Effort normal.  Musculoskeletal: She exhibits tenderness. She  exhibits no edema.  TTP of left lower lumbar and left upper gluteus. Decreased strength with flexion of left hip. Decreased sensation to lateral aspect of hip. Unable to ambulate on her own. No C, T, or L spine tenderness to palpation. No obvious signs of trauma, deformity, infection, step-offs. Lung expansion normal. No scoliosis or kyphosis.  No patellar reflexes on the left; patellar reflexes 2+ on the right, pedal pulses 2+, Refill less than 3 seconds.  Neurological: She is alert and oriented to person, place, and time.  Skin: Skin is warm and dry. She is not diaphoretic.  Psychiatric: She has a normal mood and affect. Her behavior is normal. Judgment and thought  content normal.  Nursing note and vitals reviewed.    ED Course  Procedures (including critical care time)  DIAGNOSTIC STUDIES: Oxygen Saturation is 100% on RA, normal by my interpretation.    COORDINATION OF CARE: 1:48 PM - Discussed treatment plan with pt at bedside which includes pain management and MRI scan. Pt verbalized understanding and agreed to plan.   Labs Review Labs Reviewed - No data to display  Imaging Review No results found. I have personally reviewed and evaluated these images and lab results as part of my medical decision-making.   EKG Interpretation None      MDM   Final diagnoses:  Back pain   Labs:  Imaging: MRI Lumbar Spine Wo Contrast  Consults:  Therapeutics: Toradol 15 mg injection  Discharge Meds:   Assessment/Plan:  Pt presents Today with back and leg pain. Patient's symptoms are most consistent with sciatic pain. Patient has a history of the same on the right. This has worsened, to the point where patient has decreased strength in the leg. Due to patient's persistent of symptoms and reported strength deficits she will need MRI of her lumbar spine for further evaluation. Patient has no infectious etiology at this time. Patient care will be signed off to oncoming provider pending  MRI results. If no emergent concern. The ED patient will likely be discharged home with pain medication and specialist follow-up. I have spoken with patient of potential outcomes, she verbalized her understanding and agreement today's plan had no further questions          Okey Regal, PA-C 04/01/16 [place], MD 04/01/16 [id number]

## 2016-04-01 NOTE — ED Notes (Signed)
Patient's family member insisting patient is given a room with a bed and states "family has made a decision that they want further x-rays, scans, and MRI".  Moved patient to room with bed and given warm blanket.

## 2016-04-01 NOTE — Discharge Instructions (Signed)
Ashley [name],  Nice meeting you! Please follow-up with your primary care provider and neurosurgery. Return to the emergency department if you develop fevers, lose control of your bladder/bowel, have increased pain, are unable to walk, have new/worsening symptoms. Feel better soon!  S. Wendie Simmer, PA-C Back Pain, Adult Back pain is very common in adults.The cause of back pain is rarely dangerous and the pain often gets better over time.The cause of your back pain may not be known. Some common causes of back pain include:  Strain of the muscles or ligaments supporting the spine.  Wear and tear (degeneration) of the spinal disks.  Arthritis.  Direct injury to the back. For many people, back pain may return. Since back pain is rarely dangerous, most people can learn to manage this condition on their own. HOME CARE INSTRUCTIONS Watch your back pain for any changes. The following actions may help to lessen any discomfort you are feeling:  Remain active. It is stressful on your back to sit or stand in one place for long periods of time. Do not sit, drive, or stand in one place for more than 30 minutes at a time. Take short walks on even surfaces as soon as you are able.Try to increase the length of time you walk each day.  Exercise regularly as directed by your health care provider. Exercise helps your back heal faster. It also helps avoid future injury by keeping your muscles strong and flexible.  Do not stay in bed.Resting more than 1-2 days can delay your recovery.  Pay attention to your body when you bend and lift. The most comfortable positions are those that put less stress on your recovering back. Always use proper lifting techniques, including:  Bending your knees.  Keeping the load close to your body.  Avoiding twisting.  Find a comfortable position to sleep. Use a firm mattress and lie on your side with your knees slightly bent. If you lie on your back, put a pillow  under your knees.  Avoid feeling anxious or stressed.Stress increases muscle tension and can worsen back pain.It is important to recognize when you are anxious or stressed and learn ways to manage it, such as with exercise.  Take medicines only as directed by your health care provider. Over-the-counter medicines to reduce pain and inflammation are often the most helpful.Your health care provider may prescribe muscle relaxant drugs.These medicines help dull your pain so you can more quickly return to your normal activities and healthy exercise.  Apply ice to the injured area:  Put ice in a plastic bag.  Place a towel between your skin and the bag.  Leave the ice on for 20 minutes, 2-3 times a day for the first 2-3 days. After that, ice and heat may be alternated to reduce pain and spasms.  Maintain a healthy weight. Excess weight puts extra stress on your back and makes it difficult to maintain good posture. SEEK MEDICAL CARE IF:  You have pain that is not relieved with rest or medicine.  You have increasing pain going down into the legs or buttocks.  You have pain that does not improve in one week.  You have night pain.  You lose weight.  You have a fever or chills. SEEK IMMEDIATE MEDICAL CARE IF:   You develop new bowel or bladder control problems.  You have unusual weakness or numbness in your arms or legs.  You develop nausea or vomiting.  You develop abdominal pain.  You feel  faint.   This information is not intended to replace advice given to you by your health care provider. Make sure you discuss any questions you have with your health care provider.   Document Released: 09/22/2005 Document Revised: 10/13/2014 Document Reviewed: 01/24/2014 Elsevier Interactive Patient Education [employer].

## 2016-04-01 NOTE — ED Notes (Signed)
She c/o back and leg pain. She was here last week for the pain and given pain medication to take at home but the pain has gotten worse. States she is now having a hard time walking because the pain is so severe. She was referred to the ortho doctor but she lost the paperwork with the referral so she never scheduled an appt. She is alert and breathing easily.

## 2016-04-01 NOTE — Progress Notes (Signed)
MRI ATTEMPTED AND PT STARTED CRYING AND MOVING, PT STATED THAT SHE COULD NOT LAY THERE ANY LONGER AND THAT SHE WAS IN TO MUCH PAIN, TRIED TO CALL RN THREE TIMES AND PHONE KEPT ROLLING OVER TO SECRETARY

## 2016-04-03 ENCOUNTER — Other Ambulatory Visit: Payer: Self-pay | Admitting: Neurosurgery

## 2016-04-04 ENCOUNTER — Encounter (HOSPITAL_COMMUNITY): Payer: Self-pay | Admitting: *Deleted

## 2016-04-04 MED ORDER — CEFAZOLIN SODIUM-DEXTROSE 2-4 GM/100ML-% IV SOLN
2.0000 g | INTRAVENOUS | Status: AC
  Administered 2016-04-07: 2 g via INTRAVENOUS
  Filled 2016-04-04: qty 100

## 2016-04-06 ENCOUNTER — Emergency Department (HOSPITAL_COMMUNITY)
Admission: EM | Admit: 2016-04-06 | Discharge: 2016-04-06 | Disposition: A | Discharge status: Home / Self Care | Payer: BLUE CROSS/BLUE SHIELD | Attending: Emergency Medicine | Admitting: Emergency Medicine

## 2016-04-06 ENCOUNTER — Encounter (HOSPITAL_COMMUNITY): Payer: Self-pay | Admitting: Emergency Medicine

## 2016-04-06 DIAGNOSIS — F329 Major depressive disorder, single episode, unspecified: Secondary | ICD-10-CM | POA: Diagnosis not present

## 2016-04-06 DIAGNOSIS — F17210 Nicotine dependence, cigarettes, uncomplicated: Secondary | ICD-10-CM | POA: Insufficient documentation

## 2016-04-06 DIAGNOSIS — Z7984 Long term (current) use of oral hypoglycemic drugs: Secondary | ICD-10-CM | POA: Insufficient documentation

## 2016-04-06 DIAGNOSIS — I10 Essential (primary) hypertension: Secondary | ICD-10-CM | POA: Diagnosis not present

## 2016-04-06 DIAGNOSIS — E1165 Type 2 diabetes mellitus with hyperglycemia: Secondary | ICD-10-CM | POA: Diagnosis not present

## 2016-04-06 DIAGNOSIS — Z794 Long term (current) use of insulin: Secondary | ICD-10-CM | POA: Insufficient documentation

## 2016-04-06 LAB — URINALYSIS, ROUTINE W REFLEX MICROSCOPIC
BILIRUBIN URINE: NEGATIVE
Hgb urine dipstick: NEGATIVE
KETONES UR: NEGATIVE mg/dL
Leukocytes, UA: NEGATIVE
NITRITE: NEGATIVE
PH: 5.5 (ref 5.0–8.0)
Protein, ur: NEGATIVE mg/dL
SPECIFIC GRAVITY, URINE: 1.036 — AB (ref 1.005–1.030)

## 2016-04-06 LAB — BASIC METABOLIC PANEL
ANION GAP: 14 (ref 5–15)
Anion gap: 10 (ref 5–15)
BUN: 28 mg/dL — AB (ref 6–20)
BUN: 24 mg/dL — AB (ref 6–20)
CHLORIDE: 100 mmol/L — AB (ref 101–111)
CHLORIDE: 108 mmol/L (ref 101–111)
CO2: 16 mmol/L — AB (ref 22–32)
CO2: 20 mmol/L — AB (ref 22–32)
Calcium: 9.5 mg/dL (ref 8.9–10.3)
Calcium: 8.7 mg/dL — ABNORMAL LOW (ref 8.9–10.3)
Creatinine, Ser: 1.39 mg/dL — ABNORMAL HIGH (ref 0.44–1.00)
Creatinine, Ser: 1.15 mg/dL — ABNORMAL HIGH (ref 0.44–1.00)
GFR calc Af Amer: 48 mL/min — ABNORMAL LOW (ref >60)
GFR calc Af Amer: >60 mL/min (ref >60)
GFR calc non Af Amer: 41 mL/min — ABNORMAL LOW (ref >60)
GFR calc non Af Amer: 52 mL/min — ABNORMAL LOW (ref >60)
GLUCOSE: 592 mg/dL — AB (ref 65–99)
GLUCOSE: 231 mg/dL — AB (ref 65–99)
POTASSIUM: 4.2 mmol/L (ref 3.5–5.1)
Potassium: 4.7 mmol/L (ref 3.5–5.1)
Sodium: 130 mmol/L — ABNORMAL LOW (ref 135–145)
Sodium: 138 mmol/L (ref 135–145)

## 2016-04-06 LAB — URINE MICROSCOPIC-ADD ON
BACTERIA UA: NONE SEEN
RBC / HPF: NONE SEEN RBC/hpf (ref 0–5)
WBC UA: NONE SEEN WBC/hpf (ref 0–5)

## 2016-04-06 LAB — I-STAT VENOUS BLOOD GAS, ED
ACID-BASE DEFICIT: 7 mmol/L — AB (ref 0.0–2.0)
BICARBONATE: 17.4 meq/L — AB (ref 20.0–24.0)
O2 SAT: 65 %
PH VEN: 7.356 — AB (ref 7.250–7.300)
PO2 VEN: 34 mmHg (ref 31.0–45.0)
TCO2: 18 mmol/L (ref 0–100)
pCO2, Ven: 31 mmHg — ABNORMAL LOW (ref 45.0–50.0)

## 2016-04-06 LAB — CBC
HEMATOCRIT: 37.4 % (ref 36.0–46.0)
HEMOGLOBIN: 12.2 g/dL (ref 12.0–15.0)
MCH: 29.2 pg (ref 26.0–34.0)
MCHC: 32.6 g/dL (ref 30.0–36.0)
MCV: 89.5 fL (ref 78.0–100.0)
Platelets: 372 10*3/uL (ref 150–400)
RBC: 4.18 MIL/uL (ref 3.87–5.11)
RDW: 14.6 % (ref 11.5–15.5)
WBC: 13.9 10*3/uL — ABNORMAL HIGH (ref 4.0–10.5)

## 2016-04-06 LAB — CBG MONITORING, ED
Glucose-Capillary: >600 mg/dL (ref 65–99)
Glucose-Capillary: 295 mg/dL — ABNORMAL HIGH (ref 65–99)
Glucose-Capillary: 262 mg/dL — ABNORMAL HIGH (ref 65–99)

## 2016-04-06 MED ORDER — SODIUM CHLORIDE 0.9 % IV BOLUS (SEPSIS)
1000.0000 mL | Freq: Once | INTRAVENOUS | Status: AC
  Administered 2016-04-06: 1000 mL via INTRAVENOUS

## 2016-04-06 MED ORDER — INSULIN ASPART 100 UNIT/ML ~~LOC~~ SOLN
10.0000 [IU] | Freq: Once | SUBCUTANEOUS | Status: AC
  Administered 2016-04-06: 10 [IU] via INTRAVENOUS
  Filled 2016-04-06: qty 1

## 2016-04-06 NOTE — Discharge Instructions (Signed)
Blood Glucose Monitoring, Adult ° °Monitoring your blood glucose (also know as blood sugar) helps you to manage your diabetes. It also helps you and your health care provider monitor your diabetes and determine how well your treatment plan is working. °WHY SHOULD YOU MONITOR YOUR BLOOD GLUCOSE? °· It can help you understand how food, exercise, and medicine affect your blood glucose. °· It allows you to know what your blood glucose is at any given moment. You can quickly tell if you are having low blood glucose (hypoglycemia) or high blood glucose (hyperglycemia). °· It can help you and your health care provider know how to adjust your medicines. °· It can help you understand how to manage an illness or adjust medicine for exercise. °WHEN SHOULD YOU TEST? °Your health care provider will help you decide how often you should check your blood glucose. This may depend on the type of diabetes you have, your diabetes control, or the types of medicines you are taking. Be sure to write down all of your blood glucose readings so that this information can be reviewed with your health care provider. See below for examples of testing times that your health care provider may suggest. °Type 1 Diabetes °· Test at least 2 times per day if your diabetes is well controlled, if you are using an insulin pump, or if you perform multiple daily injections. °· If your diabetes is not well controlled or if you are sick, you may need to test more often. °· It is a good idea to also test: °¨ Before every insulin injection. °¨ Before and after exercise. °¨ Between meals and 2 hours after a meal. °¨ Occasionally between 2:00 a.m. and 3:00 a.m. °Type 2 Diabetes °· If you are taking insulin, test at least 2 times per day. However, it is best to test before every insulin injection. °· If you take medicines by mouth (orally), test 2 times a day. °· If you are on a controlled diet, test once a day. °· If your diabetes is not well controlled or if you  are sick, you may need to monitor more often. °HOW TO MONITOR YOUR BLOOD GLUCOSE °Supplies Needed °· Blood glucose meter. °· Test strips for your meter. Each meter has its own strips. You must use the strips that go with your own meter. °· A pricking needle (lancet). °· A device that holds the lancet (lancing device). °· A journal or log book to write down your results. °Procedure °· Wash your hands with soap and water. Alcohol is not preferred. °· Prick the side of your finger (not the tip) with the lancet. °· Gently milk the finger until a small drop of blood appears. °· Follow the instructions that come with your meter for inserting the test strip, applying blood to the strip, and using your blood glucose meter. °Other Areas to Get Blood for Testing °Some meters allow you to use other areas of your body (other than your finger) to test your blood. These areas are called alternative sites. The most common alternative sites are: °· The forearm. °· The thigh. °· The back area of the lower leg. °· The palm of the hand. °The blood flow in these areas is slower. Therefore, the blood glucose values you get may be delayed, and the numbers are different from what you would get from your fingers. Do not use alternative sites if you think you are having hypoglycemia. Your reading will not be accurate. Always use a finger if you   are having hypoglycemia. Also, if you cannot feel your lows (hypoglycemia unawareness), always use your fingers for your blood glucose checks. °ADDITIONAL TIPS FOR GLUCOSE MONITORING °· Do not reuse lancets. °· Always carry your supplies with you. °· All blood glucose meters have a 24-hour "hotline" number to call if you have questions or need help. °· Adjust (calibrate) your blood glucose meter with a control solution after finishing a few boxes of strips. °BLOOD GLUCOSE RECORD KEEPING °It is a good idea to keep a daily record or log of your blood glucose readings. Most glucose meters, if not all,  keep your glucose records stored in the meter. Some meters come with the ability to download your records to your home computer. Keeping a record of your blood glucose readings is especially helpful if you are wanting to look for patterns. Make notes to go along with the blood glucose readings because you might forget what happened at that exact time. Keeping good records helps you and your health care provider to work together to achieve good diabetes management.  °  °This information is not intended to replace advice given to you by your health care provider. Make sure you discuss any questions you have with your health care provider. °  °Document Released: 09/25/2003 Document Revised: 10/13/2014 Document Reviewed: 02/14/2013 °Elsevier Interactive Patient Education ©2016 Elsevier Inc. ° °

## 2016-04-06 NOTE — ED Notes (Signed)
Blood sugar 592 per lab

## 2016-04-06 NOTE — ED Notes (Signed)
Pt stable, ambulatory, states understanding of discharge instructions 

## 2016-04-06 NOTE — ED Notes (Signed)
Pt states she is suppose to have back surgery tomorrow and is very anxious.  States blood sugar is reading "high" at home.

## 2016-04-06 NOTE — ED Notes (Signed)
Pt requesting information on when she will be admitted.  Spoke to MD.  MD requested to update pt.

## 2016-04-06 NOTE — ED Notes (Signed)
Pt cbg read HI on the glucose monitor

## 2016-04-06 NOTE — ED Provider Notes (Signed)
CSN: [id number]     Arrival date & time 04/06/16  1701 History   First MD Initiated Contact with Patient 04/06/16 1723     Chief Complaint  Patient presents with  . Hyperglycemia   (Consider location/radiation/quality/duration/timing/severity/associated sxs/prior Treatment) HPI 55 y.o. female with a hx of DM, HTN, Bipolar Disorder, Depression Anxiety, presents to the Emergency Department today complaining of high blood sugar. Pt states that she has been very anxious recently due to her scheduled back surgery tomorrow morning. States her sugars have been running really high and she can't control it. Pt states that she took four 1000mg  Metformin pills in attempt to control her blood sugar around 2:30p. Pain mainly in back, but this is chronic. No CP/SOB/ABD pain. No N/V/D. No headaches. No fevers. Notes polyuria. Polydipsia. No other symptoms noted    Past Medical History  Diagnosis Date  . GERD (gastroesophageal reflux disease)   . Bipolar 1 disorder ([place])   . Diabetes mellitus without complication (HCC)     Type II  . HTN (hypertension)     not on medication  . Heart murmur     "nothing to worry about"  . Depression   . Anxiety   . Arthritis    Past Surgical History  Procedure Laterality Date  . Abdominal hysterectomy     Family History  Problem Relation Age of Onset  . Diabetes Mellitus II Father   . Diabetes Brother   . Heart disease Neg Hx   . Stroke Neg Hx   . Kidney disease Neg Hx    Social History  Substance Use Topics  . Smoking status: Current Every Day Smoker -- 1.00 packs/day for 39 years    Types: Cigarettes  . Smokeless tobacco: None     Comment: pt states that she is using the E cigs and is trying to quit  . Alcohol Use: No   OB History    Gravida Para Term Preterm AB TAB SAB Ectopic Multiple Living   4 3 2  1    1 4      Review of Systems ROS reviewed and all are negative for acute change except as noted in the HPI.  Allergies  Review of patient's  allergies indicates no known allergies.  Home Medications   Prior to Admission medications   Medication Sig Start Date End Date Taking? Authorizing Provider  metFORMIN (GLUCOPHAGE) 1000 MG tablet Take 1 tablet (1,000 mg total) by mouth 2 (two) times daily with a meal. 12/11/14  Yes Camelia Eng Tysinger, PA-C  oxyCODONE-acetaminophen (PERCOCET/ROXICET) 5-325 MG tablet Take 1 tablet by mouth every 6 (six) hours as needed for severe pain. 04/01/16  Yes Tecumseh Lions, PA-C  predniSONE (STERAPRED UNI-PAK 21 TAB) 10 MG (21) TBPK tablet Take 1 tablet (10 mg total) by mouth daily. Take 6 tabs by mouth daily  for 2 days, then 5 tabs for 2 days, then 4 tabs for 2 days, then 3 tabs for 2 days, 2 tabs for 2 days, then 1 tab by mouth daily for 2 days Patient taking differently: Take 10-60 mg by mouth daily. Tapered course started 04/01/16: Take 6 tabs (60 mg) by mouth daily  for 2 days, then 5 tabs (50 mg) for 2 days, then 4 tabs (40 mg) for 2 days, then 3 tabs (30 mg) for 2 days, 2 tabs (20 mg) for 2 days, then 1 tab (10 mg) by mouth daily for 2 days, then stop 04/01/16  Yes Hays Lions, PA-C  QUEtiapine (SEROQUEL) 400 MG tablet TAKE 2 TABLETS (800 MG TOTAL) BY MOUTH AT BEDTIME. 03/19/15  Yes Camelia Eng Tysinger, PA-C  gabapentin (NEURONTIN) 300 MG capsule Take 1 capsule (300 mg total) by mouth 3 (three) times daily. Patient not taking: Reported on 04/03/2016 03/24/16   Helane Gunther Joy, PA-C  Insulin Detemir (LEVEMIR) 100 UNIT/ML Pen Inject 40 Units into the skin daily at 10 pm. Patient not taking: Reported on 04/03/2016 12/11/14   Camelia Eng Tysinger, PA-C  lidocaine (LIDODERM) 5 % Place 1 patch onto the skin daily. Remove & Discard patch within 12 hours or as directed by MD Patient not taking: Reported on 04/03/2016 03/24/16   Shawn C Joy, PA-C  methocarbamol (ROBAXIN) 500 MG tablet Take 1 tablet (500 mg total) by mouth 2 (two) times daily. Patient not taking: Reported on 04/03/2016 03/24/16   Shawn C Joy, PA-C   naproxen (NAPROSYN) 500 MG tablet Take 1 tablet (500 mg total) by mouth 2 (two) times daily. Patient not taking: Reported on 04/03/2016 01/08/16   Shawn C Joy, PA-C  naproxen (NAPROSYN) 500 MG tablet Take 1 tablet (500 mg total) by mouth 2 (two) times daily. Patient not taking: Reported on 04/03/2016 03/24/16   Shawn C Joy, PA-C   BP 129/94 mmHg  Pulse 86  Temp(Src) 100 F (37.8 C) (Oral)  Resp 20  SpO2 97%   Physical Exam  Constitutional: She is oriented to person, place, and time. She appears well-developed and well-nourished.  HENT:  Head: Normocephalic and atraumatic.  Eyes: EOM are normal. Pupils are equal, round, and reactive to light.  Neck: Normal range of motion. Neck supple. No tracheal deviation present.  Cardiovascular: Normal rate, regular rhythm, normal heart sounds and intact distal pulses.   No murmur heard. Pulmonary/Chest: Effort normal and breath sounds normal. No respiratory distress. She has no wheezes. She has no rales. She exhibits no tenderness.  Abdominal: Soft. Normal appearance and bowel sounds are normal. There is no tenderness. There is no rigidity, no rebound, no guarding, no tenderness at McBurney's point and negative Murphy's sign.  Musculoskeletal: Normal range of motion.  Neurological: She is alert and oriented to person, place, and time.  Skin: Skin is warm and dry.  Psychiatric: She has a normal mood and affect. Her behavior is normal. Thought content normal.  Nursing note and vitals reviewed.  ED Course  Procedures (including critical care time) Labs Review Labs Reviewed  BASIC METABOLIC PANEL - Abnormal; Notable for the following:    Sodium 130 (*)    Chloride 100 (*)    CO2 16 (*)    Glucose, Bld 592 (*)    BUN 28 (*)    Creatinine, Ser 1.39 (*)    GFR calc non Af Amer 41 (*)    GFR calc Af Amer 48 (*)    All other components within normal limits  CBC - Abnormal; Notable for the following:    WBC 13.9 (*)    All other components within  normal limits  URINALYSIS, ROUTINE W REFLEX MICROSCOPIC (NOT AT [facility]) - Abnormal; Notable for the following:    Specific Gravity, Urine 1.036 (*)    Glucose, UA >1000 (*)    All other components within normal limits  BASIC METABOLIC PANEL - Abnormal; Notable for the following:    CO2 20 (*)    Glucose, Bld 231 (*)    BUN 24 (*)    Creatinine, Ser 1.15 (*)    Calcium 8.7 (*)  GFR calc non Af Amer 52 (*)    All other components within normal limits  URINE MICROSCOPIC-ADD ON - Abnormal; Notable for the following:    Squamous Epithelial / LPF 0-5 (*)    All other components within normal limits  CBG MONITORING, ED - Abnormal; Notable for the following:    Glucose-Capillary >600 (*)    All other components within normal limits  I-STAT VENOUS BLOOD GAS, ED - Abnormal; Notable for the following:    pH, Ven 7.356 (*)    pCO2, Ven 31.0 (*)    Bicarbonate 17.4 (*)    Acid-base deficit 7.0 (*)    All other components within normal limits  CBG MONITORING, ED - Abnormal; Notable for the following:    Glucose-Capillary 295 (*)    All other components within normal limits  CBG MONITORING, ED - Abnormal; Notable for the following:    Glucose-Capillary 262 (*)    All other components within normal limits  BLOOD GAS, VENOUS  CBG MONITORING, ED   Imaging Review No results found. I have personally reviewed and evaluated these images and lab results as part of my medical decision-making.   EKG Interpretation None      MDM  I have reviewed and evaluated the relevant laboratory values  I have reviewed the relevant previous healthcare records. I obtained HPI from historian. Patient discussed with supervising physician  ED Course:  Assessment: Pt is a 89yF with hx Dm Type 2 on Metformin who presents with Hyperglycemia. Pt set for back surgery tomorrow and notes increase in anxiety. On exam, pt in NAD. Nontoxic/nonseptic appearing. VSS. Temp 100F. Lungs CTA. Heart RRR. Abdomen nontender  soft. Glucose >600. Potassium 4.7. No Gap. Sodium 130. WBC 13.9. VBG unremarkable. UA pending. Given 2L NS Bolus in ED. 10 Units Insulin. Dispo pending further lab results and glycemic control.    Disposition/Plan:  9:21 PM Further patient care provided by Isla Pence, MD. Please see provider note.   Supervising Physician Isla Pence, MD   Final diagnoses:  Poorly controlled type 2 diabetes mellitus ([place])     Shary Decamp, PA-C 04/06/16 1901  PT'S BS HAS IMPROVED.  PT INSTR TO TAKE HER MEDS AS DIRECTED AND TO STOP THE PREDNISONE.  PT HAS SURGERY SCHEDULED FOR TOMORROW AND WANTS TO STAY IN THE HOSPITAL UNTIL THEN.  UNFORTUNATELY, THERE IS NO INDICATION FOR ADMISSION.  PT IS NOT HAPPY WITH GOING HOME.  SHE DOES KNOW THAT SHE CAN RETURN HERE AT ANY TIME.  Isla Pence, MD 04/06/16 2122

## 2016-04-07 ENCOUNTER — Ambulatory Visit (HOSPITAL_COMMUNITY): Payer: BLUE CROSS/BLUE SHIELD

## 2016-04-07 ENCOUNTER — Ambulatory Visit (HOSPITAL_COMMUNITY): Payer: BLUE CROSS/BLUE SHIELD | Admitting: Certified Registered Nurse Anesthetist

## 2016-04-07 ENCOUNTER — Observation Stay (HOSPITAL_COMMUNITY)
Admission: RE | Admit: 2016-04-07 | Discharge: 2016-04-08 | Disposition: A | Discharge status: Home / Self Care | Payer: BLUE CROSS/BLUE SHIELD | Source: Ambulatory Visit | Attending: Neurosurgery | Admitting: Neurosurgery

## 2016-04-07 ENCOUNTER — Encounter (HOSPITAL_COMMUNITY)
Admission: RE | Disposition: A | Discharge status: Home / Self Care | Payer: Self-pay | Source: Ambulatory Visit | Attending: Neurosurgery

## 2016-04-07 ENCOUNTER — Encounter (HOSPITAL_COMMUNITY): Payer: Self-pay | Admitting: *Deleted

## 2016-04-07 DIAGNOSIS — F419 Anxiety disorder, unspecified: Secondary | ICD-10-CM | POA: Insufficient documentation

## 2016-04-07 DIAGNOSIS — F329 Major depressive disorder, single episode, unspecified: Secondary | ICD-10-CM | POA: Insufficient documentation

## 2016-04-07 DIAGNOSIS — F319 Bipolar disorder, unspecified: Secondary | ICD-10-CM | POA: Diagnosis not present

## 2016-04-07 DIAGNOSIS — Z7984 Long term (current) use of oral hypoglycemic drugs: Secondary | ICD-10-CM | POA: Diagnosis not present

## 2016-04-07 DIAGNOSIS — E119 Type 2 diabetes mellitus without complications: Secondary | ICD-10-CM | POA: Diagnosis not present

## 2016-04-07 DIAGNOSIS — M5126 Other intervertebral disc displacement, lumbar region: Secondary | ICD-10-CM | POA: Diagnosis present

## 2016-04-07 DIAGNOSIS — Z419 Encounter for procedure for purposes other than remedying health state, unspecified: Secondary | ICD-10-CM

## 2016-04-07 DIAGNOSIS — M5116 Intervertebral disc disorders with radiculopathy, lumbar region: Secondary | ICD-10-CM | POA: Diagnosis present

## 2016-04-07 HISTORY — DX: Anxiety disorder, unspecified: F41.9

## 2016-04-07 HISTORY — DX: Cardiac murmur, unspecified: R01.1

## 2016-04-07 HISTORY — PX: LUMBAR LAMINECTOMY/DECOMPRESSION MICRODISCECTOMY: SHX5026

## 2016-04-07 HISTORY — DX: Depression, unspecified: F32.A

## 2016-04-07 HISTORY — DX: Major depressive disorder, single episode, unspecified: F32.9

## 2016-04-07 HISTORY — DX: Unspecified osteoarthritis, unspecified site: M19.90

## 2016-04-07 LAB — HEPATIC FUNCTION PANEL
ALK PHOS: 106 U/L (ref 38–126)
ALT: 19 U/L (ref 14–54)
AST: 33 U/L (ref 15–41)
Albumin: 3.4 g/dL — ABNORMAL LOW (ref 3.5–5.0)
BILIRUBIN DIRECT: 0.9 mg/dL — AB (ref 0.1–0.5)
BILIRUBIN INDIRECT: 0.2 mg/dL — AB (ref 0.3–0.9)
BILIRUBIN TOTAL: 1.1 mg/dL (ref 0.3–1.2)
Total Protein: 5.8 g/dL — ABNORMAL LOW (ref 6.5–8.1)

## 2016-04-07 LAB — GLUCOSE, CAPILLARY
GLUCOSE-CAPILLARY: 180 mg/dL — AB (ref 65–99)
GLUCOSE-CAPILLARY: 173 mg/dL — AB (ref 65–99)
Glucose-Capillary: 164 mg/dL — ABNORMAL HIGH (ref 65–99)
Glucose-Capillary: 160 mg/dL — ABNORMAL HIGH (ref 65–99)
Glucose-Capillary: 353 mg/dL — ABNORMAL HIGH (ref 65–99)

## 2016-04-07 LAB — SURGICAL PCR SCREEN
MRSA, PCR: NEGATIVE
Staphylococcus aureus: NEGATIVE

## 2016-04-07 SURGERY — LUMBAR LAMINECTOMY/DECOMPRESSION MICRODISCECTOMY 1 LEVEL
Anesthesia: General | Site: Spine Lumbar | Laterality: Left

## 2016-04-07 MED ORDER — FENTANYL CITRATE (PF) 100 MCG/2ML IJ SOLN
INTRAMUSCULAR | Status: AC
  Filled 2016-04-07: qty 2

## 2016-04-07 MED ORDER — METHOCARBAMOL 1000 MG/10ML IJ SOLN
500.0000 mg | Freq: Four times a day (QID) | INTRAVENOUS | Status: DC | PRN
  Filled 2016-04-07: qty 5

## 2016-04-07 MED ORDER — HEMOSTATIC AGENTS (NO CHARGE) OPTIME
TOPICAL | Status: DC | PRN
  Administered 2016-04-07: 1 via TOPICAL

## 2016-04-07 MED ORDER — OXYCODONE HCL 5 MG PO TABS
5.0000 mg | ORAL_TABLET | Freq: Once | ORAL | Status: AC | PRN
  Administered 2016-04-07: 5 mg via ORAL

## 2016-04-07 MED ORDER — FENTANYL CITRATE (PF) 250 MCG/5ML IJ SOLN
INTRAMUSCULAR | Status: AC
  Filled 2016-04-07: qty 5

## 2016-04-07 MED ORDER — MIDAZOLAM HCL 2 MG/2ML IJ SOLN
INTRAMUSCULAR | Status: AC
  Filled 2016-04-07: qty 2

## 2016-04-07 MED ORDER — ONDANSETRON HCL 4 MG/2ML IJ SOLN: 4.0000 mg | INTRAMUSCULAR | Status: DC | PRN

## 2016-04-07 MED ORDER — MUPIROCIN 2 % EX OINT
TOPICAL_OINTMENT | CUTANEOUS | Status: AC
  Administered 2016-04-07: 1 via TOPICAL
  Filled 2016-04-07: qty 22

## 2016-04-07 MED ORDER — OXYCODONE HCL 5 MG/5ML PO SOLN: 5.0000 mg | Freq: Once | ORAL | Status: AC | PRN

## 2016-04-07 MED ORDER — ONDANSETRON HCL 4 MG/2ML IJ SOLN: 4.0000 mg | Freq: Once | INTRAMUSCULAR | Status: DC | PRN

## 2016-04-07 MED ORDER — SODIUM CHLORIDE 0.9% FLUSH
3.0000 mL | Freq: Two times a day (BID) | INTRAVENOUS | Status: DC
  Administered 2016-04-08: 3 mL via INTRAVENOUS

## 2016-04-07 MED ORDER — BISACODYL 10 MG RE SUPP: 10.0000 mg | Freq: Every day | RECTAL | Status: DC | PRN

## 2016-04-07 MED ORDER — KCL IN DEXTROSE-NACL 20-5-0.45 MEQ/L-%-% IV SOLN
INTRAVENOUS | Status: DC
  Administered 2016-04-07: 20:00:00 via INTRAVENOUS
  Filled 2016-04-07 (×3): qty 1000

## 2016-04-07 MED ORDER — MUPIROCIN 2 % EX OINT
1.0000 "application " | TOPICAL_OINTMENT | Freq: Once | CUTANEOUS | Status: AC
  Administered 2016-04-07: 1 via TOPICAL

## 2016-04-07 MED ORDER — CEFAZOLIN IN D5W 1 GM/50ML IV SOLN
1.0000 g | Freq: Three times a day (TID) | INTRAVENOUS | Status: AC
  Administered 2016-04-07 – 2016-04-08 (×2): 1 g via INTRAVENOUS
  Filled 2016-04-07 (×2): qty 50

## 2016-04-07 MED ORDER — LACTATED RINGERS IV SOLN
INTRAVENOUS | Status: DC
  Administered 2016-04-07 (×3): via INTRAVENOUS

## 2016-04-07 MED ORDER — SUGAMMADEX SODIUM 200 MG/2ML IV SOLN
INTRAVENOUS | Status: DC | PRN
  Administered 2016-04-07: 140 mg via INTRAVENOUS

## 2016-04-07 MED ORDER — HYDROCODONE-ACETAMINOPHEN 5-325 MG PO TABS: 1.0000 | ORAL_TABLET | ORAL | Status: DC | PRN

## 2016-04-07 MED ORDER — ROCURONIUM BROMIDE 100 MG/10ML IV SOLN
INTRAVENOUS | Status: DC | PRN
  Administered 2016-04-07 (×2): 5 mg via INTRAVENOUS
  Administered 2016-04-07: 50 mg via INTRAVENOUS

## 2016-04-07 MED ORDER — ACETAMINOPHEN 650 MG RE SUPP: 650.0000 mg | RECTAL | Status: DC | PRN

## 2016-04-07 MED ORDER — FAMOTIDINE IN NACL 20-0.9 MG/50ML-% IV SOLN
20.0000 mg | Freq: Two times a day (BID) | INTRAVENOUS | Status: DC
  Administered 2016-04-07: 20 mg via INTRAVENOUS
  Filled 2016-04-07 (×2): qty 50

## 2016-04-07 MED ORDER — CHLORHEXIDINE GLUCONATE CLOTH 2 % EX PADS: 6.0000 | MEDICATED_PAD | Freq: Once | CUTANEOUS | Status: DC

## 2016-04-07 MED ORDER — HYDROMORPHONE HCL 1 MG/ML IJ SOLN
INTRAMUSCULAR | Status: AC
  Administered 2016-04-07: 0.5 mg via INTRAVENOUS
  Filled 2016-04-07: qty 1

## 2016-04-07 MED ORDER — METFORMIN HCL 500 MG PO TABS
1000.0000 mg | ORAL_TABLET | Freq: Two times a day (BID) | ORAL | Status: DC
  Administered 2016-04-08: 1000 mg via ORAL
  Filled 2016-04-07: qty 2

## 2016-04-07 MED ORDER — SENNOSIDES-DOCUSATE SODIUM 8.6-50 MG PO TABS: 1.0000 | ORAL_TABLET | Freq: Every evening | ORAL | Status: DC | PRN

## 2016-04-07 MED ORDER — ZOLPIDEM TARTRATE 5 MG PO TABS: 5.0000 mg | ORAL_TABLET | Freq: Every evening | ORAL | Status: DC | PRN

## 2016-04-07 MED ORDER — OXYCODONE-ACETAMINOPHEN 5-325 MG PO TABS
1.0000 | ORAL_TABLET | ORAL | Status: DC | PRN
  Administered 2016-04-07 – 2016-04-08 (×3): 2 via ORAL
  Filled 2016-04-07 (×2): qty 2

## 2016-04-07 MED ORDER — LIDOCAINE-EPINEPHRINE 1 %-1:100000 IJ SOLN
INTRAMUSCULAR | Status: DC | PRN
  Administered 2016-04-07: 5 mL

## 2016-04-07 MED ORDER — FENTANYL CITRATE (PF) 100 MCG/2ML IJ SOLN
INTRAMUSCULAR | Status: DC | PRN
  Administered 2016-04-07 (×3): 50 ug via INTRAVENOUS

## 2016-04-07 MED ORDER — EPHEDRINE SULFATE-NACL 50-0.9 MG/10ML-% IV SOSY
PREFILLED_SYRINGE | INTRAVENOUS | Status: DC | PRN
  Administered 2016-04-07 (×2): 5 mg via INTRAVENOUS

## 2016-04-07 MED ORDER — METHOCARBAMOL 500 MG PO TABS
500.0000 mg | ORAL_TABLET | Freq: Four times a day (QID) | ORAL | Status: DC | PRN
  Administered 2016-04-07: 500 mg via ORAL

## 2016-04-07 MED ORDER — HYDROMORPHONE HCL 1 MG/ML IJ SOLN
0.2500 mg | INTRAMUSCULAR | Status: DC | PRN
  Administered 2016-04-07 (×4): 0.5 mg via INTRAVENOUS

## 2016-04-07 MED ORDER — ALUM & MAG HYDROXIDE-SIMETH 200-200-20 MG/5ML PO SUSP: 30.0000 mL | Freq: Four times a day (QID) | ORAL | Status: DC | PRN

## 2016-04-07 MED ORDER — FENTANYL CITRATE (PF) 100 MCG/2ML IJ SOLN
INTRAMUSCULAR | Status: DC | PRN
  Administered 2016-04-07: 100 ug via INTRAVENOUS

## 2016-04-07 MED ORDER — PROPOFOL 10 MG/ML IV BOLUS
INTRAVENOUS | Status: AC
  Filled 2016-04-07: qty 20

## 2016-04-07 MED ORDER — METHOCARBAMOL 500 MG PO TABS
ORAL_TABLET | ORAL | Status: AC
  Filled 2016-04-07: qty 1

## 2016-04-07 MED ORDER — DOCUSATE SODIUM 100 MG PO CAPS
100.0000 mg | ORAL_CAPSULE | Freq: Two times a day (BID) | ORAL | Status: DC
  Administered 2016-04-07 – 2016-04-08 (×2): 100 mg via ORAL
  Filled 2016-04-07 (×2): qty 1

## 2016-04-07 MED ORDER — INSULIN ASPART 100 UNIT/ML ~~LOC~~ SOLN
0.0000 [IU] | Freq: Three times a day (TID) | SUBCUTANEOUS | Status: DC
  Administered 2016-04-08: 11 [IU] via SUBCUTANEOUS

## 2016-04-07 MED ORDER — MIDAZOLAM HCL 5 MG/5ML IJ SOLN
INTRAMUSCULAR | Status: DC | PRN
  Administered 2016-04-07 (×2): 1 mg via INTRAVENOUS

## 2016-04-07 MED ORDER — DEXTROSE 5 % IV SOLN
10.0000 mg | INTRAVENOUS | Status: DC | PRN
  Administered 2016-04-07: 25 ug/min via INTRAVENOUS

## 2016-04-07 MED ORDER — LIDOCAINE 2% (20 MG/ML) 5 ML SYRINGE
INTRAMUSCULAR | Status: DC | PRN
  Administered 2016-04-07: 60 mg via INTRAVENOUS

## 2016-04-07 MED ORDER — PROPOFOL 10 MG/ML IV BOLUS
INTRAVENOUS | Status: DC | PRN
  Administered 2016-04-07: 150 mg via INTRAVENOUS

## 2016-04-07 MED ORDER — OXYCODONE-ACETAMINOPHEN 5-325 MG PO TABS
ORAL_TABLET | ORAL | Status: AC
  Filled 2016-04-07: qty 2

## 2016-04-07 MED ORDER — PHENOL 1.4 % MT LIQD: 1.0000 | OROMUCOSAL | Status: DC | PRN

## 2016-04-07 MED ORDER — BUPIVACAINE HCL (PF) 0.5 % IJ SOLN
INTRAMUSCULAR | Status: DC | PRN
  Administered 2016-04-07: 5 mL

## 2016-04-07 MED ORDER — MENTHOL 3 MG MT LOZG: 1.0000 | LOZENGE | OROMUCOSAL | Status: DC | PRN

## 2016-04-07 MED ORDER — ONDANSETRON HCL 4 MG/2ML IJ SOLN
INTRAMUSCULAR | Status: DC | PRN
  Administered 2016-04-07: 4 mg via INTRAVENOUS

## 2016-04-07 MED ORDER — THROMBIN 5000 UNITS EX SOLR
CUTANEOUS | Status: DC | PRN
  Administered 2016-04-07 (×2): 5000 [IU] via TOPICAL

## 2016-04-07 MED ORDER — 0.9 % SODIUM CHLORIDE (POUR BTL) OPTIME
TOPICAL | Status: DC | PRN
  Administered 2016-04-07: 1000 mL

## 2016-04-07 MED ORDER — ACETAMINOPHEN 325 MG PO TABS: 650.0000 mg | ORAL_TABLET | ORAL | Status: DC | PRN

## 2016-04-07 MED ORDER — FLEET ENEMA 7-19 GM/118ML RE ENEM: 1.0000 | ENEMA | Freq: Once | RECTAL | Status: DC | PRN

## 2016-04-07 MED ORDER — DIPHENHYDRAMINE HCL 25 MG PO CAPS: 25.0000 mg | ORAL_CAPSULE | Freq: Four times a day (QID) | ORAL | Status: DC | PRN

## 2016-04-07 MED ORDER — HYDROMORPHONE HCL 1 MG/ML IJ SOLN: 0.5000 mg | INTRAMUSCULAR | Status: DC | PRN

## 2016-04-07 MED ORDER — QUETIAPINE FUMARATE 400 MG PO TABS
400.0000 mg | ORAL_TABLET | Freq: Every day | ORAL | Status: DC
  Administered 2016-04-07: 400 mg via ORAL
  Filled 2016-04-07: qty 1

## 2016-04-07 MED ORDER — OXYCODONE HCL 5 MG PO TABS
ORAL_TABLET | ORAL | Status: AC
  Filled 2016-04-07: qty 1

## 2016-04-07 MED ORDER — METHYLPREDNISOLONE ACETATE 80 MG/ML IJ SUSP
INTRAMUSCULAR | Status: DC | PRN
  Administered 2016-04-07: 80 mg

## 2016-04-07 MED ORDER — SODIUM CHLORIDE 0.9% FLUSH: 3.0000 mL | INTRAVENOUS | Status: DC | PRN

## 2016-04-07 MED ORDER — SODIUM CHLORIDE 0.9 % IV SOLN: 250.0000 mL | INTRAVENOUS | Status: DC

## 2016-04-07 SURGICAL SUPPLY — 65 items
ADH SKN CLS APL DERMABOND .7 (GAUZE/BANDAGES/DRESSINGS) ×1
APL SKNCLS STERI-STRIP NONHPOA (GAUZE/BANDAGES/DRESSINGS)
BENZOIN TINCTURE PRP APPL 2/3 (GAUZE/BANDAGES/DRESSINGS) IMPLANT
BIT DRILL NEURO 2X3.1 SFT TUCH (MISCELLANEOUS) ×1 IMPLANT
BLADE CLIPPER SURG (BLADE) IMPLANT
BUR ROUND FLUTED 5 RND (BURR) ×2 IMPLANT
BUR ROUND FLUTED 5MM RND (BURR) ×1
CANISTER SUCT 3000ML PPV (MISCELLANEOUS) ×3 IMPLANT
CLOSURE WOUND 1/2 X4 (GAUZE/BANDAGES/DRESSINGS)
DECANTER SPIKE VIAL GLASS SM (MISCELLANEOUS) ×3 IMPLANT
DERMABOND ADVANCED (GAUZE/BANDAGES/DRESSINGS) ×2
DERMABOND ADVANCED .7 DNX12 (GAUZE/BANDAGES/DRESSINGS) ×1 IMPLANT
DRAPE LAPAROTOMY 100X72X124 (DRAPES) ×3 IMPLANT
DRAPE MICROSCOPE LEICA (MISCELLANEOUS) ×3 IMPLANT
DRAPE POUCH INSTRU U-SHP 10X18 (DRAPES) ×3 IMPLANT
DRAPE SURG 17X23 STRL (DRAPES) ×3 IMPLANT
DRILL NEURO 2X3.1 SOFT TOUCH (MISCELLANEOUS) ×3
DRSG OPSITE POSTOP 3X4 (GAUZE/BANDAGES/DRESSINGS) ×2 IMPLANT
DURAPREP 26ML APPLICATOR (WOUND CARE) ×3 IMPLANT
ELECT REM PT RETURN 9FT ADLT (ELECTROSURGICAL) ×3
ELECTRODE REM PT RTRN 9FT ADLT (ELECTROSURGICAL) ×1 IMPLANT
GAUZE SPONGE 4X4 12PLY STRL (GAUZE/BANDAGES/DRESSINGS) IMPLANT
GAUZE SPONGE 4X4 16PLY XRAY LF (GAUZE/BANDAGES/DRESSINGS) IMPLANT
GLOVE BIO SURGEON STRL SZ8 (GLOVE) ×3 IMPLANT
GLOVE BIOGEL PI IND STRL 6.5 (GLOVE) IMPLANT
GLOVE BIOGEL PI IND STRL 8 (GLOVE) ×1 IMPLANT
GLOVE BIOGEL PI IND STRL 8.5 (GLOVE) ×1 IMPLANT
GLOVE BIOGEL PI INDICATOR 6.5 (GLOVE) ×4
GLOVE BIOGEL PI INDICATOR 8 (GLOVE) ×6
GLOVE BIOGEL PI INDICATOR 8.5 (GLOVE) ×2
GLOVE ECLIPSE 6.5 STRL STRAW (GLOVE) ×2 IMPLANT
GLOVE ECLIPSE 7.5 STRL STRAW (GLOVE) ×6 IMPLANT
GLOVE ECLIPSE 8.0 STRL XLNG CF (GLOVE) ×3 IMPLANT
GLOVE EXAM NITRILE LRG STRL (GLOVE) IMPLANT
GLOVE EXAM NITRILE MD LF STRL (GLOVE) IMPLANT
GLOVE EXAM NITRILE XL STR (GLOVE) IMPLANT
GLOVE EXAM NITRILE XS STR PU (GLOVE) IMPLANT
GLOVE SURG SS PI 6.5 STRL IVOR (GLOVE) ×4 IMPLANT
GOWN STRL REUS W/ TWL LRG LVL3 (GOWN DISPOSABLE) IMPLANT
GOWN STRL REUS W/ TWL XL LVL3 (GOWN DISPOSABLE) ×1 IMPLANT
GOWN STRL REUS W/TWL 2XL LVL3 (GOWN DISPOSABLE) ×5 IMPLANT
GOWN STRL REUS W/TWL LRG LVL3 (GOWN DISPOSABLE) ×6
GOWN STRL REUS W/TWL XL LVL3 (GOWN DISPOSABLE) ×3
KIT BASIN OR (CUSTOM PROCEDURE TRAY) ×3 IMPLANT
KIT ROOM TURNOVER OR (KITS) ×3 IMPLANT
NDL HYPO 18GX1.5 BLUNT FILL (NEEDLE) IMPLANT
NDL HYPO 25X1 1.5 SAFETY (NEEDLE) ×1 IMPLANT
NDL SPNL 18GX3.5 QUINCKE PK (NEEDLE) IMPLANT
NEEDLE HYPO 18GX1.5 BLUNT FILL (NEEDLE) ×3 IMPLANT
NEEDLE HYPO 25X1 1.5 SAFETY (NEEDLE) ×3 IMPLANT
NEEDLE SPNL 18GX3.5 QUINCKE PK (NEEDLE) ×3 IMPLANT
NS IRRIG 1000ML POUR BTL (IV SOLUTION) ×3 IMPLANT
PACK LAMINECTOMY NEURO (CUSTOM PROCEDURE TRAY) ×3 IMPLANT
PAD ARMBOARD 7.5X6 YLW CONV (MISCELLANEOUS) ×13 IMPLANT
RUBBERBAND STERILE (MISCELLANEOUS) ×6 IMPLANT
SPONGE SURGIFOAM ABS GEL SZ50 (HEMOSTASIS) ×3 IMPLANT
STRIP CLOSURE SKIN 1/2X4 (GAUZE/BANDAGES/DRESSINGS) IMPLANT
SUT VIC AB 0 CT1 18XCR BRD8 (SUTURE) ×1 IMPLANT
SUT VIC AB 0 CT1 8-18 (SUTURE) ×3
SUT VIC AB 2-0 CT1 18 (SUTURE) ×3 IMPLANT
SUT VIC AB 3-0 SH 8-18 (SUTURE) ×3 IMPLANT
SYR 5ML LL (SYRINGE) ×2 IMPLANT
TOWEL OR 17X24 6PK STRL BLUE (TOWEL DISPOSABLE) ×3 IMPLANT
TOWEL OR 17X26 10 PK STRL BLUE (TOWEL DISPOSABLE) ×3 IMPLANT
WATER STERILE IRR 1000ML POUR (IV SOLUTION) ×3 IMPLANT

## 2016-04-07 NOTE — Brief Op Note (Signed)
04/07/2016  4:36 PM  PATIENT:  Ashley [name]  55 y.o. 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, degenerative disc disease, radiculopathy   PROCEDURE:  Procedure(s): Left Lumbar four-five Microdiskectomy (Left) microdissection  SURGEON:  Surgeon(s) and Role:    * Erline Levine, MD - Primary    * Ashok Pall, MD - Assisting  PHYSICIAN ASSISTANT:   ASSISTANTS: Poteat, RN   ANESTHESIA:   general  EBL:  Total I/O In: 1000 [I.V.:1000] Out: 10 [Blood:10]  BLOOD ADMINISTERED:none  DRAINS: none   LOCAL MEDICATIONS USED:  LIDOCAINE   SPECIMEN:  No Specimen  DISPOSITION OF SPECIMEN:  N/A  COUNTS:  YES  TOURNIQUET:  * No tourniquets in log *  DICTATION: Patient has a large L4-5 disc rupture on the left with significant left leg weakness. It was elected to take her to surgery for left L4-5 microdiscectomy.  Procedure: Patient was brought to the operating room and following the smooth and uncomplicated induction of general endotracheal anesthesia she was placed in a prone position on the Wilson frame. Low back was prepped and draped in the usual sterile fashion with betadine scrub and DuraPrep.  Preoperative localizing radiograph was obtained with a spinal needle.  Area of planned incision was infiltrated with local lidocaine. Incision was made in the midline and carried to the lumbodorsal fascia which was incised on the left side of midline. Subperiosteal dissection was performed exposing what was felt to be L45 level. Intraoperative x-ray demonstrated marker probe at L4-5. A hemi-semi-laminectomy of L4 was performed a high-speed drill and completed with Kerrison rongeurs and a generous foraminotomy was performed overlying the superior aspect of the L5 lamina. Ligamentum flavum was detached and removed in a piecemeal fashion and the L5  nerve root was decompressed laterally with removal of the superior aspect of the facet and ligamentum causing nerve root compression. The microscope was brought into the field and the L5 nerve root was mobilized medially. This exposed a large amount of soft disc material and a free fragment of herniated disc material. Multiple fragments were removed and these extended into the interspace which appeared to be quite soft with a disrupted annulus overlying the interspace. As a result it was elected to further decompress the interspace and remove loose disc material and this was done with a variety Epstein curettes and pituitary rongeurs. The redundant annulus was also removed with 2 mm Kerrison rongeur. There was evidence on the MRI of cephalad fragments and these were carefully removed and the L4 nerve root was also decompressed. At this point it was felt that all neural elements were well decompressed and there was no evidence of residual loose disc material within the interspace. The interspace was then irrigated with bacitracin saline and no additional disc material was mobilized. Hemostasis was assured with bipolar electrocautery and the interspace was irrigated with Depo-Medrol and fentanyl. The lumbodorsal fascia was closed with 0 Vicryl sutures the subcutaneous tissues reapproximated 2-0 Vicryl inverted sutures and the skin edges were reapproximated with 3-0 Vicryl subcuticular stitch. The wound was dressed with Dermabond and an occlusive dressing. Patient was extubated in the operating room and taken to recovery in stable and satisfactory condition having tolerated his operation well counts were correct at the end of the case.  [name] OF CARE: Admit to inpatient   PATIENT DISPOSITION:  PACU - hemodynamically stable.   Delay start  of Pharmacological VTE agent (>24hrs) due to surgical blood loss or risk of bleeding: yes

## 2016-04-07 NOTE — Progress Notes (Signed)
Awake, alert, conversant.  Full bilateral PF/DF/EHL.  Doing well.

## 2016-04-07 NOTE — Progress Notes (Signed)
Patient arrived to 5C18 from PACU alert and pleasant. Pt denied any distress. Safety precautions and orders reviewed. ICS encourage. Will reinforce again. VSS. Will continue to monitor.   Ave Filter, RN

## 2016-04-07 NOTE — Anesthesia Procedure Notes (Signed)
Procedure Name: Intubation Date/Time: 04/07/2016 2:40 PM Performed by: Bethel Born Pre-anesthesia Checklist: Patient identified, Emergency Drugs available, Suction available, Patient being monitored and Timeout performed Patient Re-evaluated:Patient Re-evaluated prior to inductionOxygen Delivery Method: Circle system utilized Preoxygenation: Pre-oxygenation with 100% oxygen Intubation Type: IV induction Ventilation: Mask ventilation without difficulty Laryngoscope Size: Miller and 2 Grade View: Grade I Tube size: 7.5 mm Number of attempts: 1 Airway Equipment and Method: Stylet Secured at: 22 cm Tube secured with: Tape Dental Injury: Teeth and Oropharynx as per pre-operative assessment

## 2016-04-07 NOTE — Transfer of Care (Signed)
Immediate Anesthesia Transfer of Care Note  Patient: Ashley [name]  Procedure(s) Performed: Procedure(s): Left Lumbar four-five Microdiskectomy (Left)  Patient Location: PACU  Anesthesia Type:General  Level of Consciousness: awake, alert , oriented and patient cooperative  Airway & Oxygen Therapy: Patient Spontanous Breathing and Patient connected to nasal cannula oxygen  Post-op Assessment: Report given to RN, Post -op Vital signs reviewed and stable and Patient moving all extremities X 4  Post vital signs: Reviewed and stable  Last Vitals: There were no vitals filed for this visit.  Last Pain:  Filed Vitals:   04/07/16 0833  PainSc: 8       Patients Stated Pain Goal: 3 ([id number])  Complications: No apparent anesthesia complications

## 2016-04-07 NOTE — Op Note (Signed)
04/07/2016  4:36 PM  PATIENT:  Ashley [name]  55 y.o. 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, degenerative disc disease, radiculopathy   PROCEDURE:  Procedure(s): Left Lumbar four-five Microdiskectomy (Left) microdissection  SURGEON:  Surgeon(s) and Role:    * Erline Levine, MD - Primary    * Ashok Pall, MD - Assisting  PHYSICIAN ASSISTANT:   ASSISTANTS: Poteat, RN   ANESTHESIA:   general  EBL:  Total I/O In: 1000 [I.V.:1000] Out: 10 [Blood:10]  BLOOD ADMINISTERED:none  DRAINS: none   LOCAL MEDICATIONS USED:  LIDOCAINE   SPECIMEN:  No Specimen  DISPOSITION OF SPECIMEN:  N/A  COUNTS:  YES  TOURNIQUET:  * No tourniquets in log *  DICTATION: Patient has a large L4-5 disc rupture on the left with significant left leg weakness. It was elected to take her to surgery for left L4-5 microdiscectomy.  Procedure: Patient was brought to the operating room and following the smooth and uncomplicated induction of general endotracheal anesthesia she was placed in a prone position on the Wilson frame. Low back was prepped and draped in the usual sterile fashion with betadine scrub and DuraPrep.  Preoperative localizing radiograph was obtained with a spinal needle.  Area of planned incision was infiltrated with local lidocaine. Incision was made in the midline and carried to the lumbodorsal fascia which was incised on the left side of midline. Subperiosteal dissection was performed exposing what was felt to be L45 level. Intraoperative x-ray demonstrated marker probe at L4-5. A hemi-semi-laminectomy of L4 was performed a high-speed drill and completed with Kerrison rongeurs and a generous foraminotomy was performed overlying the superior aspect of the L5 lamina. Ligamentum flavum was detached and removed in a piecemeal fashion and the L5  nerve root was decompressed laterally with removal of the superior aspect of the facet and ligamentum causing nerve root compression. The microscope was brought into the field and the L5 nerve root was mobilized medially. This exposed a large amount of soft disc material and a free fragment of herniated disc material. Multiple fragments were removed and these extended into the interspace which appeared to be quite soft with a disrupted annulus overlying the interspace. As a result it was elected to further decompress the interspace and remove loose disc material and this was done with a variety Epstein curettes and pituitary rongeurs. The redundant annulus was also removed with 2 mm Kerrison rongeur. There was evidence on the MRI of cephalad fragments and these were carefully removed and the L4 nerve root was also decompressed. At this point it was felt that all neural elements were well decompressed and there was no evidence of residual loose disc material within the interspace. The interspace was then irrigated with bacitracin saline and no additional disc material was mobilized. Hemostasis was assured with bipolar electrocautery and the interspace was irrigated with Depo-Medrol and fentanyl. The lumbodorsal fascia was closed with 0 Vicryl sutures the subcutaneous tissues reapproximated 2-0 Vicryl inverted sutures and the skin edges were reapproximated with 3-0 Vicryl subcuticular stitch. The wound was dressed with Dermabond and an occlusive dressing. Patient was extubated in the operating room and taken to recovery in stable and satisfactory condition having tolerated his operation well counts were correct at the end of the case.  [name] OF CARE: Admit to inpatient   PATIENT DISPOSITION:  PACU - hemodynamically stable.   Delay start  of Pharmacological VTE agent (>24hrs) due to surgical blood loss or risk of bleeding: yes

## 2016-04-07 NOTE — Interval H&P Note (Signed)
History and Physical Interval Note:  04/07/2016 9:19 AM  Ashley [name]  has presented today for surgery, with the diagnosis of Lumbar herniated disc  The various methods of treatment have been discussed with the patient and family. After consideration of risks, benefits and other options for treatment, the patient has consented to  Procedure(s) with comments: Left L4-5 Microdiskectomy (Left) - Left L4-5 Microdiskectomy as a surgical intervention .  The patient's history has been reviewed, patient examined, no change in status, stable for surgery.  I have reviewed the patient's chart and labs.  Questions were answered to the patient's satisfaction.     Thaddeaus Monica D

## 2016-04-07 NOTE — H&P (Signed)
Patient ID:   [id number] Patient: Ashley [name]  Date of Birth: 11-26-1960 Visit Type: Office Visit   Date: 04/02/2016 03:00 PM Provider: Marchia Meiers. Vertell Limber MD   This 55 year old female presents for back pain.  History of Present Illness: 1.  back pain    Ashley [name] visits today in follow-up to her ER visit yesterday reporting lumbar and left leg pain.  She recalls no injury, stating leg pain began gradually.   (She was seen in 2015 for lumbar and right lower extremity pain by Dr. Hal Neer.  Imaging did not reflect right side nerve impingement and her symptoms were treated with steroid Dosepak.) Pain is 12/10 today.  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 not able to bear weight on left leg, 4/5 left quadriceps weakness   MRI on [facility]  04/01/16 MRI: 2 x 3 cm cystic lesion in the right posterior liver unchanged from 2014 the consistent with hemangioma based on the CT. Large extruded disc fragment on the left at L4-5 with superior migration of disc material compressing the left L4 nerve root and causing mild spinal stenosis. Moderate to marked foraminal encroachment bilaterally L5-S1 due to endplate spurring and facet hypertrophy.      Medical/Surgical/Interim History Reviewed, no change.   PAST MEDICAL HISTORY, SURGICAL HISTORY, FAMILY HISTORY, SOCIAL HISTORY AND REVIEW OF SYSTEMS  05/01/2014, which I have signed.  Family History: Reviewed, no changes.    Social History: Tobacco use reviewed. Reviewed, no changes.     MEDICATIONS(added, continued or stopped this visit): Started Medication Directions Instruction Stopped   metformin 1,000 mg tablet take 1 tablet by oral route 2 times every day with morning and evening meals     Seroquel 400 mg tablet take 1 tablet by oral route 2  times every day       ALLERGIES: Ingredient Reaction Medication Name Comment  NO KNOWN ALLERGIES     No known allergies.    Vitals Date Temp F BP Pulse Ht In Wt Lb BMI BSA Pain Score  04/02/2016  117/82 87 67    4/10     DIAGNOSTIC RESULTS 04/01/16 MRI: 2 x 3 cm cystic lesion in the right posterior liver unchanged from 2014 the consistent with hemangioma based on the CT. Large extruded disc fragment on the left at L4-5 with superior migration of disc material compressing the left L4 nerve root and causing mild spinal stenosis. Moderate to marked foraminal encroachment bilaterally L5-S1 due to endplate spurring and facet hypertrophy.    IMPRESSION The patient is experiencing low back and left leg pain. On review of her MRI, she has a significant disc extrusion at L4-5 on the left which is causing compression of the left L4 nerve root and spinal stenosis. On confrontational testing, she has pain to palpation on her left sided low back and 4/5 quadriceps weakness on the left. On physical examination, she cannot bear any weight on her left leg and uses a cane to walk because her pain is so severe. I recommend a left L4-5 microdiscectomy for alleviation of her low back and left leg symptoms.   Assessment/Plan # Detail Type Description   1. Assessment Herniated nucleus pulposus, lumbar (M51.26).       2. Assessment Lumbar radiculopathy (M54.16).       3. Assessment Left leg weakness (R29.898).  Pain Assessment/Treatment Pain Scale: 4/10. Method: Numeric Pain Intensity Scale. Location: back. Onset: 05/01/2006. Duration: varies. Quality: discomforting. Pain Assessment/Treatment follow-up plan of care: Patient is taking medications prescribed.  Fall Risk Plan The patient has not fallen in the last year.  Schedule left L4-5 microdiscectomy. Nurse education given.              Provider:  Marchia Meiers. Vertell Limber MD  04/02/2016 04:54 PM Dictation edited by: Johnella Moloney    CC Providers: [facility] [address] [place], [place] [address]-              Electronically signed by Marchia Meiers. Vertell Limber MD on 04/04/2016 04:09 PM

## 2016-04-07 NOTE — Interval H&P Note (Signed)
History and Physical Interval Note:  04/07/2016 2:43 PM  Ashley [name]  has presented today for surgery, with the diagnosis of Lumbar herniated disc  The various methods of treatment have been discussed with the patient and family. After consideration of risks, benefits and other options for treatment, the patient has consented to  Procedure(s) with comments: Left L4-5 Microdiskectomy (Left) - Left L4-5 Microdiskectomy as a surgical intervention .  The patient's history has been reviewed, patient examined, no change in status, stable for surgery.  I have reviewed the patient's chart and labs.  Questions were answered to the patient's satisfaction.     Laurielle Selmon D

## 2016-04-07 NOTE — Progress Notes (Signed)
Dr. Ola Spurr, E made aware of ED visit last night

## 2016-04-07 NOTE — Progress Notes (Signed)
Patient made aware of delay

## 2016-04-07 NOTE — Anesthesia Preprocedure Evaluation (Signed)
Anesthesia Evaluation  Patient identified by MRN, date of birth, ID band Patient awake    Reviewed: Allergy & Precautions, NPO status , Patient's Chart, lab work & pertinent test results  Airway Mallampati: II  TM Distance: >3 FB Neck ROM: Full    Dental  (+) Poor Dentition,    Pulmonary Current Smoker,    + rhonchi        Cardiovascular hypertension,  Rhythm:Regular Rate:Normal     Neuro/Psych    GI/Hepatic   Endo/Other  diabetes  Renal/GU      Musculoskeletal   Abdominal   Peds  Hematology   Anesthesia Other Findings   Reproductive/Obstetrics                             Anesthesia Physical Anesthesia Plan  ASA: III  Anesthesia Plan: General   Post-op Pain Management:    Induction: Intravenous  Airway Management Planned: Oral ETT  Additional Equipment:   Intra-op Plan:   Post-operative Plan: Extubation in OR  Informed Consent: I have reviewed the patients History and Physical, chart, labs and discussed the procedure including the risks, benefits and alternatives for the proposed anesthesia with the patient or authorized representative who has indicated his/her understanding and acceptance.   Dental advisory given  Plan Discussed with: CRNA and Anesthesiologist  Anesthesia Plan Comments:         Anesthesia Quick Evaluation

## 2016-04-08 DIAGNOSIS — M5116 Intervertebral disc disorders with radiculopathy, lumbar region: Secondary | ICD-10-CM | POA: Diagnosis not present

## 2016-04-08 LAB — GLUCOSE, CAPILLARY
GLUCOSE-CAPILLARY: 259 mg/dL — AB (ref 65–99)
Glucose-Capillary: 320 mg/dL — ABNORMAL HIGH (ref 65–99)
Glucose-Capillary: 342 mg/dL — ABNORMAL HIGH (ref 65–99)

## 2016-04-08 MED ORDER — FAMOTIDINE 20 MG PO TABS: 20.0000 mg | ORAL_TABLET | Freq: Two times a day (BID) | ORAL | Status: DC

## 2016-04-08 MED ORDER — HYDROCODONE-ACETAMINOPHEN 5-325 MG PO TABS: 1.0000 | ORAL_TABLET | Freq: Four times a day (QID) | ORAL | Status: DC | PRN

## 2016-04-08 MED ORDER — OXYCODONE-ACETAMINOPHEN 5-325 MG PO TABS: 1.0000 | ORAL_TABLET | Freq: Four times a day (QID) | ORAL | Status: DC | PRN

## 2016-04-08 MED ORDER — TIZANIDINE HCL 4 MG PO TABS: 4.0000 mg | ORAL_TABLET | Freq: Four times a day (QID) | ORAL | Status: DC | PRN

## 2016-04-08 NOTE — Progress Notes (Signed)
OT Cancellation Note  Patient Details Name: Ashley [name] MRN: [id number] DOB: [birth year]   Cancelled Treatment:    Reason Eval/Treat Not Completed: OT screened, no needs identified, will sign off. According to PT via verbal discussion, pt is currently at baseline with no therapy needs identified. If any new OT needs are identified during this hospital stay, please re-order OT. Thank you for the order.   Chrys Racer , MS, OTR/L, CLT Pager: [phone]  04/08/2016, 11:14 AM

## 2016-04-08 NOTE — Discharge Instructions (Signed)
Lumbar Diskectomy °Lumbar diskectomy is a surgical procedure that treats low back pain that is caused by an injured disk. Disks are cushions in your spine that are between your spinal bones (vertebrae). When a disk ruptures or pushes out of its normal space (herniates), it can cause pressure on your spinal cord or the nerves that leave your spinal cord. This can cause back pain, numbness, or weakness. °You may need this procedure if other treatments have not worked. °LET YOUR HEALTH CARE PROVIDER KNOW ABOUT: °· Any allergies you have. °· All medicines you are taking, including vitamins, herbs, eye drops, creams, and over-the-counter medicines. °· Previous problems you or members of your family have had with the use of anesthetics. °· Any blood disorders you have. °· Previous surgeries you have had. °· Any medical conditions you may have. °RISKS AND COMPLICATIONS °Generally, this is a safe procedure. However, problems may occur, including: °· Bleeding. °· Infection. °· Damage to the nerve or spinal cord. °· Worsening pain. °· Failure to relieve your symptoms. °BEFORE THE PROCEDURE °· You may need an imaging study done of your spine to help plan the procedure. °· Follow instructions from your health care provider about eating or drinking restrictions. °· Ask your health care provider about: °¨ Changing or stopping your regular medicines. This is especially important if you are taking diabetes medicines or blood thinners. °¨ Taking medicines such as aspirin and ibuprofen. These medicines can thin your blood. Do not take these medicines before your procedure if your health care provider instructs you not to. °· Do not drink alcohol. °· Do not use any tobacco products, including cigarettes, chewing tobacco, and electronic cigarettes. If you need help quitting, ask your health care provider. °· Plan to have someone take you home after the procedure. °· If you go home right after the procedure, plan to have someone with  you for 24 hours. °PROCEDURE °· An IV tube will be inserted into one of your veins. °· You will be given one or more of the following: °¨ A medicine that helps you relax (sedative). °¨ A medicine that numbs the area (local anesthetic). °¨ A medicine that makes you fall asleep (general anesthetic). °¨ A medicine that is injected into your spine that numbs the area below and slightly above the injection site (spinal anesthetic). °· You will be positioned face-down on the operating table. °· Your lower back will be cleaned with a germ-killing (antiseptic) solution. °· Your surgeon will make an incision over your spine. °· The muscles and nerves over your spine will be moved aside so your vertebrae and injured disk can be seen easily. Your surgeon may use a specific type of operating microscope. °· Your surgeon may need to remove some connecting tissue (ligaments) or pieces of bone to get to your disk. °· Your surgeon will remove the part of the disk that is causing your symptoms. °· The muscles and nerves will be put back in their normal position. °· The incision will be closed with stitches (sutures) or staples. A bandage (dressing) will be placed over the incision. °The procedure may vary among health care providers and hospitals. °AFTER THE PROCEDURE °· Your blood pressure, heart rate, breathing rate, and blood oxygen level will be monitored often until the medicines you were given have worn off. °· Your IV tube can be taken out when you are able to drink fluids on your own. °· You will be encouraged to get up and walk around as soon   as you can. °· You may meet with a physical therapist to talk about recovering at home. °  °This information is not intended to replace advice given to you by your health care provider. Make sure you discuss any questions you have with your health care provider. °  °Document Released: 02/06/2015 Document Revised: 06/13/2015 Document Reviewed: 02/06/2015 °Elsevier Interactive Patient  Education ©2016 [employer] ° °

## 2016-04-08 NOTE — Progress Notes (Signed)
Pt asking rn if she is ready to be discharged. rn explained that she needed to give some medications and make sure all the pts discharge instructions are completed.

## 2016-04-08 NOTE — Evaluation (Signed)
Physical Therapy Evaluation Patient Details Name: Ashley [name] MRN: [id number] DOB: May 02, 1961 Today's Date: 04/08/2016   History of Present Illness  Patient is a 55 y/o female wtih hx of bipolar disorder, HTN, DM, depression, anxiety presents s/p L4-5 Microdiskectomy microdissection.  Clinical Impression  Patient presents with mild weakness s/p above surgery impacting mobility. Tolerated gait training and stair training with Mod I for safety. No balance deficits noted. Pt using therapist as [place] during stair negotiation. Pt will have support from g/f and family at home. Education on proper body mechanics and back precautions. Pt does not require further skilled therapy services as she is functioning close to baseline and has support at home. Discharge from therapy.     Follow Up Recommendations No PT follow up;Supervision - Intermittent    Equipment Recommendations  None recommended by PT    Recommendations for Other Services       Precautions / Restrictions Precautions Precautions: Back Precaution Booklet Issued: No Precaution Comments: Reviewed proper body mechanics/precautions Restrictions Weight Bearing Restrictions: No      Mobility  Bed Mobility Overal bed mobility: Needs Assistance Bed Mobility: Rolling;Sidelying to Sit;Sit to Sidelying Rolling: Modified independent (Device/Increase time) Sidelying to sit: Modified independent (Device/Increase time)     Sit to sidelying: Modified independent (Device/Increase time) General bed mobility comments: HOB flat, no use of rails to simulate home. No assist needed.  Transfers Overall transfer level: Needs assistance Equipment used: None Transfers: Sit to/from Stand Sit to Stand: Modified independent (Device/Increase time)         General transfer comment: Slow to stand but no difficulty or LOB.  Ambulation/Gait Ambulation/Gait assistance: Modified independent (Device/Increase time) Ambulation Distance (Feet): 200  Feet Assistive device: None Gait Pattern/deviations: Step-through pattern;Decreased stride length;Narrow base of support Gait velocity: decreased Gait velocity interpretation: <1.8 ft/sec, indicative of risk for recurrent falls General Gait Details: Slow, steady gait with narrow BoS. No LOB or dizziness.  Stairs Stairs: Yes Stairs assistance: Min assist Stair Management: Step to pattern;One rail Right Number of Stairs: 13 General stair comments: Therapist used as [place] for LUE support. Cues for technique.  Wheelchair Mobility    Modified Rankin (Stroke Patients Only)       Balance Overall balance assessment: Needs assistance Sitting-balance support: Feet supported;No upper extremity supported Sitting balance-Leahy Scale: Normal     Standing balance support: During functional activity Standing balance-Leahy Scale: Good                               Pertinent Vitals/Pain Pain Assessment: No/denies pain    Home Living Family/patient expects to be discharged to:: Private residence Living Arrangements: Non-relatives/Friends Available Help at Discharge: Friend(s);Available 24 hours/day Type of Home: Apartment Home Access: Stairs to enter Entrance Stairs-Rails: Right Entrance Stairs-Number of Steps: 13 Home Layout: One level Home Equipment: Cane - single point      Prior Function Level of Independence: Independent with assistive device(s)         Comments: Uses SPC for stair negotiation.     Hand Dominance        Extremity/Trunk Assessment   Upper Extremity Assessment: Defer to OT evaluation           Lower Extremity Assessment: Generalized weakness         Communication   Communication: No difficulties  Cognition Arousal/Alertness: Awake/alert Behavior During Therapy: WFL for tasks assessed/performed Overall Cognitive Status: Within Functional Limits for tasks assessed  General Comments      Exercises         Assessment/Plan    PT Assessment Patent does not need any further PT services  PT Diagnosis Difficulty walking   PT Problem List    PT Treatment Interventions     PT Goals (Current goals can be found in the Care Plan section) Acute Rehab PT Goals Patient Stated Goal: to go home today PT Goal Formulation: With patient Time For Goal Achievement: 04/22/16 Potential to Achieve Goals: Good    Frequency     Barriers to discharge        Co-evaluation               End of Session Equipment Utilized During Treatment: Gait belt Activity Tolerance: Patient tolerated treatment well Patient left: in bed;with call bell/phone within reach Nurse Communication: Mobility status    Functional Assessment Tool Used: clinical judgment Functional Limitation: Mobility: Walking and moving around Mobility: Walking and Moving Around Current Status [id number]): At least 1 percent but less than 20 percent impaired, limited or restricted Mobility: Walking and Moving Around Goal Status [phone]): At least 1 percent but less than 20 percent impaired, limited or restricted Mobility: Walking and Moving Around Discharge Status [phone]): At least 1 percent but less than 20 percent impaired, limited or restricted    Time: 0843-0905 PT Time Calculation (min) (ACUTE ONLY): 22 min   Charges:   PT Evaluation $PT Eval Low Complexity: 1 Procedure     PT G Codes:   PT G-Codes **NOT FOR INPATIENT CLASS** Functional Assessment Tool Used: clinical judgment Functional Limitation: Mobility: Walking and moving around Mobility: Walking and Moving Around Current Status [id number]): At least 1 percent but less than 20 percent impaired, limited or restricted Mobility: Walking and Moving Around Goal Status [phone]): At least 1 percent but less than 20 percent impaired, limited or restricted Mobility: Walking and Moving Around Discharge Status [phone]): At least 1 percent but less than 20 percent impaired, limited or  restricted    [place] 04/08/2016, 9:10 AM Wray Kearns, PT, DPT [phone]

## 2016-04-08 NOTE — Progress Notes (Addendum)
Pt discharged home with family, by car, assessment stable, discharge instructions reviewed, prescriptions given, all questions answered. IV removed. Pt taken by volunteer via wheelchair to exit. Time of discharge: 1150  rn educated pt on smoking cessation, pt says she will try to cut back on cigarette smoking.

## 2016-04-08 NOTE — Care Management Note (Signed)
Case Management Note  Patient Details  Name: Ashley [name] MRN: [id number] Date of Birth: November 15, 1960  Subjective/Objective:                    Action/Plan: Pt discharging home with self care. No f/u recommendations per PT. No further needs per CM.   Expected Discharge Date:                  Expected Discharge Plan:  Home/Self Care  In-House Referral:     Discharge planning Services     Post Acute Care Choice:    Choice offered to:     DME Arranged:    DME Agency:     HH Arranged:    [place] Agency:     Status of Service:  Completed, signed off  If discussed at [employer] of Stay Meetings, dates discussed:    Additional Comments:  Pollie Friar, RN 04/08/2016, 9:57 AM

## 2016-04-08 NOTE — Progress Notes (Addendum)
Discharge order reconciliation needs to be completed before rn can print AVS. Paged md to make him aware. Will update pt.  Pt stating she just wants to leave so she can go smoke a cigarette.

## 2016-04-08 NOTE — Discharge Summary (Signed)
  Physician Discharge Summary  Patient ID: Ashley [name] MRN: [id number] DOB/AGE: March 10, 1961 56 y.o.  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 Hafer was admitted and taken to the operating room for an uncomplicated lumbar discetomy at L4/5 on the left side. Post op her lower extremity is significantly improved. Her wound is clean, dry, and without signs of infection. She is voiding, ambulating, and tolerating a regular diet.   Treatments: surgery: Left Lumbar four-five Microdiskectomy (Left) microdissection   Discharge Exam: Blood pressure 153/96, pulse 51, temperature 98.6 F (37 C), temperature source Oral, resp. rate 20, height 5\' 7"  (1.702 m), weight 77.111 kg (170 lb), SpO2 96 %. Moving all extremities well.   Disposition: 01-Home or Self Care Lumbar herniated disc    Medication List    TAKE these medications        gabapentin 300 MG capsule  Commonly known as:  NEURONTIN  Take 1 capsule (300 mg total) by mouth 3 (three) times daily.     HYDROcodone-acetaminophen 5-325 MG tablet  Commonly known as:  NORCO/VICODIN  Take 1 tablet by mouth every 6 (six) hours as needed for moderate pain.     Insulin Detemir 100 UNIT/ML Pen  Commonly known as:  LEVEMIR  Inject 40 Units into the skin daily at 10 pm.     lidocaine 5 %  Commonly known as:  LIDODERM  Place 1 patch onto the skin daily. Remove & Discard patch within 12 hours or as directed by MD     metFORMIN 1000 MG tablet  Commonly known as:  GLUCOPHAGE  Take 1 tablet (1,000 mg total) by mouth 2 (two) times daily with a meal.     methocarbamol 500 MG tablet  Commonly known as:  ROBAXIN   Take 1 tablet (500 mg total) by mouth 2 (two) times daily.     naproxen 500 MG tablet  Commonly known as:  NAPROSYN  Take 1 tablet (500 mg total) by mouth 2 (two) times daily.     naproxen 500 MG tablet  Commonly known as:  NAPROSYN  Take 1 tablet (500 mg total) by mouth 2 (two) times daily.     oxyCODONE-acetaminophen 5-325 MG tablet  Commonly known as:  PERCOCET/ROXICET  Take 1-2 tablets by mouth every 6 (six) hours as needed for moderate pain.     QUEtiapine 400 MG tablet  Commonly known as:  SEROQUEL  TAKE 2 TABLETS (800 MG TOTAL) BY MOUTH AT BEDTIME.     tiZANidine 4 MG tablet  Commonly known as:  ZANAFLEX  Take 1 tablet (4 mg total) by mouth every 6 (six) hours as needed for muscle spasms.           Follow-up Information    Follow up with STERN,JOSEPH D, MD In 3 weeks.   Specialty:  Neurosurgery   Why:  Plese call the office to make an appointment    Contact information:   [address] [place] [address] [place] [address] [phone]       Signed: Winfield Cunas 04/08/2016, 8:51 AM

## 2016-04-08 NOTE — Progress Notes (Signed)
Pt now complaining to rounding team member, rn still taking care of other patients. rn still needs to chart pts assessment. Will discharge patient as quickly as rn can.

## 2016-04-09 ENCOUNTER — Encounter (HOSPITAL_COMMUNITY): Payer: Self-pay | Admitting: Neurosurgery

## 2016-04-09 NOTE — Anesthesia Postprocedure Evaluation (Signed)
Anesthesia Post Note  Patient: Ashley [name]  Procedure(s) Performed: Procedure(s) (LRB): Left Lumbar four-five Microdiskectomy (Left)  Patient location during evaluation: PACU Anesthesia Type: General Level of consciousness: awake Pain management: pain level controlled Vital Signs Assessment: post-procedure vital signs reviewed and stable Respiratory status: spontaneous breathing Cardiovascular status: stable Postop Assessment: no signs of nausea or vomiting Anesthetic complications: no    Last Vitals:  Filed Vitals:   04/08/16 0606 04/08/16 0850  BP: 153/96 141/99  Pulse: 51 97  Temp: 37 C 36.8 C  Resp: 20 19    Last Pain:  Filed Vitals:   04/08/16 1036  PainSc: 4                  Nykira Reddix

## 2016-06-16 ENCOUNTER — Encounter (HOSPITAL_COMMUNITY): Payer: Self-pay | Admitting: *Deleted

## 2016-06-16 ENCOUNTER — Emergency Department (HOSPITAL_COMMUNITY)
Admission: EM | Admit: 2016-06-16 | Discharge: 2016-06-16 | Discharge status: Against Medical Advice | Payer: BLUE CROSS/BLUE SHIELD

## 2016-06-16 ENCOUNTER — Other Ambulatory Visit: Payer: Self-pay | Admitting: Specialist

## 2016-06-16 DIAGNOSIS — Z1231 Encounter for screening mammogram for malignant neoplasm of breast: Secondary | ICD-10-CM

## 2016-06-16 NOTE — ED Notes (Signed)
Pt sts she cannot wait any longer. NAD noted. PT ambulatory.

## 2016-06-24 ENCOUNTER — Ambulatory Visit
Admission: RE | Admit: 2016-06-24 | Discharge: 2016-06-24 | Disposition: A | Discharge status: Home / Self Care | Payer: BLUE CROSS/BLUE SHIELD | Source: Ambulatory Visit | Attending: Specialist | Admitting: Specialist

## 2016-06-24 DIAGNOSIS — Z1231 Encounter for screening mammogram for malignant neoplasm of breast: Secondary | ICD-10-CM

## 2016-06-26 ENCOUNTER — Other Ambulatory Visit: Payer: Self-pay | Admitting: Neurosurgery

## 2016-06-26 DIAGNOSIS — M5126 Other intervertebral disc displacement, lumbar region: Secondary | ICD-10-CM

## 2016-07-02 ENCOUNTER — Encounter: Payer: Self-pay | Admitting: Medical

## 2016-07-02 ENCOUNTER — Ambulatory Visit (INDEPENDENT_AMBULATORY_CARE_PROVIDER_SITE_OTHER): Payer: BLUE CROSS/BLUE SHIELD | Admitting: Medical

## 2016-07-02 VITALS — BP 100/68 | HR 90 | Temp 98.0°F | Ht 67.0 in | Wt 153.4 lb

## 2016-07-02 DIAGNOSIS — E1169 Type 2 diabetes mellitus with other specified complication: Secondary | ICD-10-CM

## 2016-07-02 DIAGNOSIS — IMO0002 Reserved for concepts with insufficient information to code with codable children: Secondary | ICD-10-CM

## 2016-07-02 DIAGNOSIS — F3170 Bipolar disorder, currently in remission, most recent episode unspecified: Secondary | ICD-10-CM | POA: Diagnosis not present

## 2016-07-02 DIAGNOSIS — F329 Major depressive disorder, single episode, unspecified: Secondary | ICD-10-CM | POA: Diagnosis not present

## 2016-07-02 DIAGNOSIS — Z2820 Immunization not carried out because of patient decision for unspecified reason: Secondary | ICD-10-CM | POA: Diagnosis not present

## 2016-07-02 DIAGNOSIS — N289 Disorder of kidney and ureter, unspecified: Secondary | ICD-10-CM | POA: Diagnosis not present

## 2016-07-02 DIAGNOSIS — E1165 Type 2 diabetes mellitus with hyperglycemia: Secondary | ICD-10-CM

## 2016-07-02 DIAGNOSIS — F17200 Nicotine dependence, unspecified, uncomplicated: Secondary | ICD-10-CM

## 2016-07-02 DIAGNOSIS — Z9119 Patient's noncompliance with other medical treatment and regimen: Secondary | ICD-10-CM | POA: Diagnosis not present

## 2016-07-02 DIAGNOSIS — G8929 Other chronic pain: Secondary | ICD-10-CM

## 2016-07-02 DIAGNOSIS — Z0289 Encounter for other administrative examinations: Secondary | ICD-10-CM

## 2016-07-02 DIAGNOSIS — M549 Dorsalgia, unspecified: Secondary | ICD-10-CM | POA: Diagnosis not present

## 2016-07-02 DIAGNOSIS — E785 Hyperlipidemia, unspecified: Secondary | ICD-10-CM

## 2016-07-02 DIAGNOSIS — F32A Depression, unspecified: Secondary | ICD-10-CM

## 2016-07-02 DIAGNOSIS — Z8673 Personal history of transient ischemic attack (TIA), and cerebral infarction without residual deficits: Secondary | ICD-10-CM | POA: Diagnosis not present

## 2016-07-02 DIAGNOSIS — Z91199 Patient's noncompliance with other medical treatment and regimen due to unspecified reason: Secondary | ICD-10-CM

## 2016-07-02 LAB — COMPREHENSIVE METABOLIC PANEL
ALK PHOS: 104 U/L (ref 33–130)
ALT: 10 U/L (ref 6–29)
AST: 12 U/L (ref 10–35)
Albumin: 4.3 g/dL (ref 3.6–5.1)
BUN: 27 mg/dL — AB (ref 7–25)
CHLORIDE: 106 mmol/L (ref 98–110)
CO2: 21 mmol/L (ref 20–31)
CREATININE: 2.86 mg/dL — AB (ref 0.50–1.05)
Calcium: 10.1 mg/dL (ref 8.6–10.4)
GLUCOSE: 113 mg/dL — AB (ref 65–99)
POTASSIUM: 4.3 mmol/L (ref 3.5–5.3)
SODIUM: 140 mmol/L (ref 135–146)
Total Bilirubin: 0.4 mg/dL (ref 0.2–1.2)
Total Protein: 7.3 g/dL (ref 6.1–8.1)

## 2016-07-02 LAB — CBC
HEMATOCRIT: 40.7 % (ref 35.0–45.0)
HEMOGLOBIN: 12.9 g/dL (ref 11.7–15.5)
MCH: 28.7 pg (ref 27.0–33.0)
MCHC: 31.7 g/dL — AB (ref 32.0–36.0)
MCV: 90.6 fL (ref 80.0–100.0)
MPV: 10.7 fL (ref 7.5–12.5)
Platelets: 416 10*3/uL — ABNORMAL HIGH (ref 140–400)
RBC: 4.49 MIL/uL (ref 3.80–5.10)
RDW: 15.3 % — ABNORMAL HIGH (ref 11.0–15.0)
WBC: 7.9 10*3/uL (ref 4.0–10.5)

## 2016-07-02 LAB — LIPID PANEL
Cholesterol: 330 mg/dL — ABNORMAL HIGH (ref 125–200)
HDL: 31 mg/dL — ABNORMAL LOW (ref >=46)
Total CHOL/HDL Ratio: 10.6 Ratio — ABNORMAL HIGH (ref <=5.0)
Triglycerides: 516 mg/dL — ABNORMAL HIGH (ref <150)

## 2016-07-02 NOTE — Progress Notes (Signed)
Subjective:   Ashley [name] is an 55 y.o. female who presents in follow up.  I last saw her once back in 12/2014.  She was seeing Ashley [name] in [place]. prior until he retired.   She has been lost to follow, has been primarily using the emergency dept since last visit.     Currently the only medications she is actually taking is Metformin BID and seroquel.  No psychiatry visits in a long time.  Prior PCP was prescribing her seroquel.  She notes not tolerating other antidepressant  She reports diagnosis of diabetes around 2010. When I saw her in 12/2014, I advised increasing Levemir, c/t Metformin and adding [place].  However, she is only taking Metformin.   She quit Levemir months ago as it was too expensive.   She is checking glucose, getting 200-300s, was 193 yesterday morning.   Not exercising  Seeing Ashley [name] for chronic back pain.    Regarding screenings, no hx/o colonoscopy, she does note however recent mammogram   Has hx/o hypertension for years, but says her BPs run normal for a long time. Currently not on medication   Has hx/o stroke on prior hospital report in 2014, but she denies every having a stroke.   Was getting Seroquel from prior PCM.  Has a history of bipolar, she denies any manic episode for many years. Was seeing a psychiatrist at one point in the remote past  She is a smoker  The following portions of the patient's history were reviewed and updated as appropriate: allergies, current medications, past family history, past medical history, past social history, past surgical history and problem list.  ROS as in subjective above  Past Medical History:  Diagnosis Date  . Anxiety   . Arthritis   . Bipolar 1 disorder ([place])   . Depression   . Diabetes mellitus without complication ([place])    Type II  . GERD (gastroesophageal reflux disease)   . Heart murmur    "nothing to worry about"  . HTN (hypertension)    not on medication   Current Outpatient  Prescriptions on File Prior to Visit  Medication Sig Dispense Refill  . metFORMIN (GLUCOPHAGE) 1000 MG tablet Take 1 tablet (1,000 mg total) by mouth 2 (two) times daily with a meal. 60 tablet 2  . QUEtiapine (SEROQUEL) 400 MG tablet TAKE 2 TABLETS (800 MG TOTAL) BY MOUTH AT BEDTIME. 60 tablet 2  . gabapentin (NEURONTIN) 300 MG capsule Take 1 capsule (300 mg total) by mouth 3 (three) times daily. (Patient not taking: Reported on 07/02/2016) 30 capsule 0  . HYDROcodone-acetaminophen (NORCO/VICODIN) 5-325 MG tablet Take 1 tablet by mouth every 6 (six) hours as needed for moderate pain. (Patient not taking: Reported on 07/02/2016) 60 tablet 0  . Insulin Detemir (LEVEMIR) 100 UNIT/ML Pen Inject 40 Units into the skin daily at 10 pm. (Patient not taking: Reported on 07/02/2016) 15 mL 2  . lidocaine (LIDODERM) 5 % Place 1 patch onto the skin daily. Remove & Discard patch within 12 hours or as directed by MD (Patient not taking: Reported on 07/02/2016) 30 patch 0  . methocarbamol (ROBAXIN) 500 MG tablet Take 1 tablet (500 mg total) by mouth 2 (two) times daily. (Patient not taking: Reported on 07/02/2016) 20 tablet 0  . naproxen (NAPROSYN) 500 MG tablet Take 1 tablet (500 mg total) by mouth 2 (two) times daily. (Patient not taking: Reported on 07/02/2016) 30 tablet 0  . oxyCODONE-acetaminophen (PERCOCET/ROXICET) 5-325 MG tablet  Take 1-2 tablets by mouth every 6 (six) hours as needed for moderate pain. (Patient not taking: Reported on 07/02/2016) 60 tablet 0  . tiZANidine (ZANAFLEX) 4 MG tablet Take 1 tablet (4 mg total) by mouth every 6 (six) hours as needed for muscle spasms. (Patient not taking: Reported on 07/02/2016) 60 tablet 0   No current facility-administered medications on file prior to visit.    Past Surgical History:  Procedure Laterality Date  . ABDOMINAL HYSTERECTOMY    . LUMBAR LAMINECTOMY/DECOMPRESSION MICRODISCECTOMY Left 04/07/2016   Procedure: Left Lumbar four-five Microdiskectomy;  Surgeon:  Erline Levine, MD;  Location: [facility];  Service: Neurosurgery;  Laterality: Left;       Objective:   BP 100/68 (BP Location: Left Arm, Patient Position: Sitting, Cuff Size: Normal)   Pulse 90   Temp 98 F (36.7 C) (Oral)   Ht 5\' 7"  (1.702 m)   Wt 153 lb 6.4 oz (69.6 kg)   SpO2 99%   BMI 24.03 kg/m   Gen: wdwn, nad, AA female Psych: Pleasant, but seems a little irritated Heart: RRR, normal S1-S2, no murmurs Lungs clear Pulses normal Extremities no edema    Assessment:   Encounter Diagnoses  Name Primary?  [place] Kitchen Uncontrolled type 2 diabetes mellitus with other specified complication ([place]) Yes  . History of stroke   . Renal insufficiency   . Hyperlipidemia   . Depression   . Bipolar affective disorder in remission ([place])   . Tobacco use disorder   . Vaccine refused by patient   . Noncompliance   . Encounter for completion of form with patient   . Chronic back pain     Plan:   Discussed prior noncompliance, need for routine f/u.   Completed her form to go to plasma donation center  She refuses vaccines in general  Diabetes type 2 - I reviewed recent emergency department report and labs. Diabetes is currently uncontrolled.  Labs today, and pending labs will likely add back on long acting insulin.  C/t current medication for now.  Discussed diet, exercise, glucose monitoring.  Hypertension- normal BP today.  Labs today.  She lost weight from earlier in the year which helped get her BPs under better control.  Consider renal protection of ARB/ACE  Hyperlipidemia, renal insufficiency - f/u pending labs  History of stroke-discussed need to keep good blood pressure and sugar control.  Recommended statin, f/u pending labs  Bipolar-depression.  Continue Seroquel, consider psychiatry recheck, request old records from prior primary care  Smoker-she likely will not quit, and reports much worse anxiety when not smoking.    Chronic back pain - f/u with Dr. Francis Dowse  as planned  Spent > 45 minutes face to face with patient in discussion of symptoms, evaluation, plan and recommendations.     Ashley [name] was seen today for diabetes.  Diagnoses and all orders for this visit:  Uncontrolled type 2 diabetes mellitus with other specified complication ([place]) -     Comprehensive metabolic panel -     CBC -     Lipid panel -     Microalbumin / creatinine urine ratio -     Hemoglobin A1c  History of stroke  Renal insufficiency -     Comprehensive metabolic panel  Hyperlipidemia -     Lipid panel  Depression  Bipolar affective disorder in remission ([place])  Tobacco use disorder  Vaccine refused by patient  Noncompliance  Encounter for completion of form with patient  Chronic back pain

## 2016-07-03 ENCOUNTER — Other Ambulatory Visit: Payer: Self-pay | Admitting: Medical

## 2016-07-03 ENCOUNTER — Other Ambulatory Visit: Payer: Self-pay

## 2016-07-03 LAB — MICROALBUMIN / CREATININE URINE RATIO

## 2016-07-03 LAB — HEMOGLOBIN A1C
Hgb A1c MFr Bld: 9 % — ABNORMAL HIGH (ref <5.7)
Mean Plasma Glucose: 212 mg/dL

## 2016-07-03 MED ORDER — QUETIAPINE FUMARATE 400 MG PO TABS: ORAL_TABLET | ORAL | 1 refills | Status: DC

## 2016-07-03 MED ORDER — INSULIN PEN NEEDLE 32G X 4 MM MISC: 1.0000 | Freq: Every day | 3 refills | Status: DC

## 2016-07-03 MED ORDER — PRAVASTATIN SODIUM 20 MG PO TABS: 20.0000 mg | ORAL_TABLET | Freq: Every day | ORAL | 0 refills | Status: DC

## 2016-07-03 MED ORDER — INSULIN DETEMIR 100 UNIT/ML FLEXPEN: 15.0000 [IU] | PEN_INJECTOR | Freq: Every day | SUBCUTANEOUS | 2 refills | Status: DC

## 2016-07-03 MED ORDER — ASPIRIN EC 81 MG PO TBEC: 81.0000 mg | DELAYED_RELEASE_TABLET | Freq: Every day | ORAL | 3 refills | Status: DC

## 2016-07-03 NOTE — Progress Notes (Signed)
Labs show diabetes marker 9, too high, blood sugars averaging high 100s/low 200s, kidney marker certainly abnormal, cholesterol numbers way too high.  Rest of labs ok.  Avoid taking medications that can damage kidneys such as Ibuprofen, Advil, Aleve, metformin.   Recommendations; 1- begin back on Levemir 15 u QHS.  Do not restart metformin due to kidney decline.  Monitor blood sugars and write the numbers down, bring them next visit. 2- begin Pravachol daily at bedtime for cholesterol 3- begin Aspirin 81mg  daily QHS 4-hydrate with at least 64 oz or more of water daily. 5-recheck in 35mo  If kidney lab not improving within a month along with changes in better sugar control, we will need to refer to nephrology.   I suspect the kidney abnormality is due to long term diabetes, hypertension.

## 2016-07-05 ENCOUNTER — Ambulatory Visit
Admission: RE | Admit: 2016-07-05 | Discharge: 2016-07-05 | Disposition: A | Discharge status: Home / Self Care | Payer: BLUE CROSS/BLUE SHIELD | Source: Ambulatory Visit | Attending: Neurosurgery | Admitting: Neurosurgery

## 2016-07-05 ENCOUNTER — Other Ambulatory Visit: Payer: Self-pay | Admitting: Neurosurgery

## 2016-07-05 DIAGNOSIS — M5126 Other intervertebral disc displacement, lumbar region: Secondary | ICD-10-CM

## 2016-08-04 ENCOUNTER — Encounter: Payer: Self-pay | Admitting: Medical

## 2016-08-04 ENCOUNTER — Ambulatory Visit (INDEPENDENT_AMBULATORY_CARE_PROVIDER_SITE_OTHER): Payer: BLUE CROSS/BLUE SHIELD | Admitting: Medical

## 2016-08-04 VITALS — BP 140/90 | HR 90 | Wt 158.4 lb

## 2016-08-04 DIAGNOSIS — I10 Essential (primary) hypertension: Secondary | ICD-10-CM

## 2016-08-04 DIAGNOSIS — N289 Disorder of kidney and ureter, unspecified: Secondary | ICD-10-CM

## 2016-08-04 DIAGNOSIS — Z794 Long term (current) use of insulin: Secondary | ICD-10-CM

## 2016-08-04 DIAGNOSIS — IMO0002 Reserved for concepts with insufficient information to code with codable children: Secondary | ICD-10-CM

## 2016-08-04 DIAGNOSIS — R011 Cardiac murmur, unspecified: Secondary | ICD-10-CM | POA: Diagnosis not present

## 2016-08-04 DIAGNOSIS — E1165 Type 2 diabetes mellitus with hyperglycemia: Secondary | ICD-10-CM

## 2016-08-04 DIAGNOSIS — Z2820 Immunization not carried out because of patient decision for unspecified reason: Secondary | ICD-10-CM

## 2016-08-04 DIAGNOSIS — E782 Mixed hyperlipidemia: Secondary | ICD-10-CM

## 2016-08-04 DIAGNOSIS — F17200 Nicotine dependence, unspecified, uncomplicated: Secondary | ICD-10-CM | POA: Diagnosis not present

## 2016-08-04 DIAGNOSIS — E1169 Type 2 diabetes mellitus with other specified complication: Secondary | ICD-10-CM

## 2016-08-04 LAB — BASIC METABOLIC PANEL
BUN: 10 mg/dL (ref 7–25)
CALCIUM: 9.9 mg/dL (ref 8.6–10.4)
CO2: 22 mmol/L (ref 20–31)
Chloride: 111 mmol/L — ABNORMAL HIGH (ref 98–110)
Creat: 0.88 mg/dL (ref 0.50–1.05)
Glucose, Bld: 147 mg/dL — ABNORMAL HIGH (ref 65–99)
Potassium: 3.8 mmol/L (ref 3.5–5.3)
SODIUM: 143 mmol/L (ref 135–146)

## 2016-08-04 NOTE — Progress Notes (Signed)
Subjective:   Ashley [name] [name] is an 55 y.o. female who presents in follow up.  I last saw her a month ago to discuss noncompliance, medication, labs.  Since last visit she is seeing glucose numbers in the low to mid 100s for the last 2-3 weeks since increasing Levemir.   She did stop metformin this past month after creatinine came back high.  She has started Pravachol and aspirin from last visit.  Takes Seroquel for mood, still smokes with no plans to quit. Declines vaccines.   She reports diagnosis of diabetes around 2010.   Seeing Ashley [name] [name] for chronic back pain.    Has hx/o hypertension for years, but says her BPs ran normal for a long time. Currently not on medication   Has hx/o stroke on prior hospital report in 2014, but she denies every having a stroke.   The following portions of the patient's history were reviewed and updated as appropriate: allergies, current medications, past family history, past medical history, past social history, past surgical history and problem list.  ROS as in subjective above  Past Medical History:  Diagnosis Date  . Anxiety   . Arthritis   . Bipolar 1 disorder ([place])   . Depression   . Diabetes mellitus without complication ([place])    Type II  . GERD (gastroesophageal reflux disease)   . Heart murmur    "nothing to worry about"  . HTN (hypertension)    not on medication   Current Outpatient Prescriptions on File Prior to Visit  Medication Sig Dispense Refill  . aspirin EC 81 MG tablet Take 1 tablet (81 mg total) by mouth daily. 90 tablet 3  . Insulin Detemir (LEVEMIR FLEXPEN) 100 UNIT/ML Pen Inject 15 Units into the skin daily at 10 pm. 30 mL 2  . Insulin Pen Needle (BD PEN NEEDLE NANO U/F) 32G X 4 MM MISC 1 each by Does not apply route daily. 100 each 3  . pravastatin (PRAVACHOL) 20 MG tablet Take 1 tablet (20 mg total) by mouth daily. 90 tablet 0  . QUEtiapine (SEROQUEL) 400 MG tablet TAKE 2 TABLETS (800 MG TOTAL) BY MOUTH AT BEDTIME. 180  tablet 1   No current facility-administered medications on file prior to visit.    Past Surgical History:  Procedure Laterality Date  . ABDOMINAL HYSTERECTOMY    . LUMBAR LAMINECTOMY/DECOMPRESSION MICRODISCECTOMY Left 04/07/2016   Procedure: Left Lumbar four-five Microdiskectomy;  Surgeon: Erline Levine, MD;  Location: [facility];  Service: Neurosurgery;  Laterality: Left;   ROS as in subjective    Objective:   There were no vitals taken for this visit.  BP Readings from Last 3 Encounters:  07/02/16 100/68  06/16/16 (!) 126/104  04/08/16 (!) 141/99    Gen: wdwn, nad, AA female Psych: [place]: faint 1-2 systolic murmur heart in right upper sternal border, otherwise RRR, normal S1-S2 Lungs somewhat decrease throughout, but no wheezing, no dullness Pulses normal Extremities no edema    Assessment:   Encounter Diagnoses  Name Primary?  Marland Kitchen Uncontrolled type 2 diabetes mellitus with other specified complication, with long-term current use of insulin ([place]) Yes  . Renal insufficiency   . Tobacco use disorder   . Mixed dyslipidemia   . Essential hypertension   . Heart murmur   . Vaccine refused by patient      Plan:   reviewed labs from 06/2016 showing diabetes marker, lipids all too high, not at goal, BPs have been running high and creatinine  had jumped up.    We stopped metformin last viist.   She has increased to 15u Levemir QHS.  She is compliant wit Pravachol, aspirin. Can increase levemir 3 units q3 days if not at goal as disucssed.  Spent bulk of visit discussing needed diet changes.    I advised that pending labs today we will likely add Amlodipine or other BP medication.  Referral to nephrology likely pending creatinine lab.  She refuses vaccines in general  reviewed 2014 echocardiogram.   She will do EKG next visit here.   I advised she stop tobacco!  She is not interested in quitting at this time.  Ashley [name] [name] was seen today for follow-up.  Diagnoses and  all orders for this visit:  Uncontrolled type 2 diabetes mellitus with other specified complication, with long-term current use of insulin (HCC) -     Basic metabolic panel  Renal insufficiency -     Basic metabolic panel  Tobacco use disorder  Mixed dyslipidemia  Essential hypertension  Heart murmur  Vaccine refused by patient

## 2016-08-04 NOTE — Patient Instructions (Signed)
Continue to monitor glucose  If your glucose is running 130 or less, continue at Levemir 15u nightly  If glucose runs over 130 for 3 days or more, then you can go up 3 units on levemir for 3 days at a time if sugars are running too high  Continue Pravachol and Aspirin nightly  Make diet changes as we discussed  We will begin a blood pressure medication pending lab results tomorrow  QUIT SMOKING!!!!!!!!!!!!!1  Follow up in 3 months

## 2016-08-05 ENCOUNTER — Other Ambulatory Visit: Payer: Self-pay | Admitting: Medical

## 2016-08-05 MED ORDER — AMLODIPINE BESYLATE 10 MG PO TABS: 10.0000 mg | ORAL_TABLET | Freq: Every day | ORAL | 2 refills | Status: DC

## 2016-08-05 MED ORDER — PRAVASTATIN SODIUM 20 MG PO TABS: 20.0000 mg | ORAL_TABLET | Freq: Every day | ORAL | 1 refills | Status: DC

## 2016-08-07 ENCOUNTER — Telehealth: Payer: Self-pay

## 2016-08-07 NOTE — Telephone Encounter (Signed)
Pt called and wants to know if she can have some for panic attacks , anxiety  So she can quit smoking

## 2016-08-11 ENCOUNTER — Telehealth: Payer: Self-pay | Admitting: Medical

## 2016-08-11 NOTE — Telephone Encounter (Signed)
Pt called back regarding request from last week for anxiety, panic attack medication.  Said hasn't heard back. Please call her ASAP

## 2016-08-11 NOTE — Telephone Encounter (Signed)
Pt called and wants to know if she can have something for anxiety and panic attack so she can quit smoking ?

## 2016-08-12 ENCOUNTER — Other Ambulatory Visit: Payer: Self-pay | Admitting: Medical

## 2016-08-12 MED ORDER — BUPROPION HCL ER (XL) 150 MG PO TB24: 150.0000 mg | ORAL_TABLET | Freq: Every day | ORAL | 2 refills | Status: DC

## 2016-08-12 NOTE — Telephone Encounter (Signed)
Ashley [name] had looked into this yesterday.  There apparently was a phone call last week that didn't get routed to me.   So just make sure if you are responding to a phone call, route it to me.   In regards to her concerns, we didn't discuss anxiety or panic attack last visit.   I did send Wellbutrin to pharmacy she can begin once daily for help with quitting tobacco as well as to help with mood.   Lets set her up a visit within 3-4 weeks to discuss this further.  Also what does she feel triggers her anxiety?  We should address the triggers of anxiety and not just add a medication to treat symptom.   Counseling is usually one of the first steps.   I recommend [facility] here in [place] to address dealing with anxiety and stress.   They have good counselors.

## 2016-08-12 NOTE — Telephone Encounter (Signed)
Called  To let her know that wellburtin was sent in to the pharmacy and also give her the phone no. To [facility],  When I as her about her trigger she was not clear on what they were.  But she said that she would give [facility] a call.

## 2016-10-10 ENCOUNTER — Telehealth: Payer: Self-pay | Admitting: Medical

## 2016-10-10 NOTE — Telephone Encounter (Signed)
Its hard to say without seeing her.  I would have her call back Dr. Melven Sartorius office or her pain management office again NOW and see what they advised.  otherwise she would need to go to urgent care given 4:30pm on a Friday as of this message, and we close at 5pm unfortunately.   I would need to see her to evaluate otherwise  Also, I have no chart records from [facility] or [facility], so please try and get last 2 notes from both.Marland Kitchen

## 2016-10-10 NOTE — Telephone Encounter (Signed)
Pt called stating she is having a lot of hip pain that she can barely move & crying. Pt said Dr Vertell Limber performed surgery on her and she has been seeing him and pain mgmt but pain is out of control now. Pt saw Dr Raphael Gibney, was given #20 pain pills but out of those, was told that has pulled muscle but pt says her pain has to be from more than pulled muscle.   Pt wants help from [place] as to what to do from this point. Request pain meds & possibly MRI?

## 2016-10-13 ENCOUNTER — Encounter (HOSPITAL_COMMUNITY): Payer: Self-pay | Admitting: Emergency Medicine

## 2016-10-13 ENCOUNTER — Emergency Department (HOSPITAL_COMMUNITY)
Admission: EM | Admit: 2016-10-13 | Discharge: 2016-10-13 | Disposition: A | Discharge status: Home / Self Care | Payer: BLUE CROSS/BLUE SHIELD | Attending: Emergency Medicine | Admitting: Emergency Medicine

## 2016-10-13 DIAGNOSIS — Z7982 Long term (current) use of aspirin: Secondary | ICD-10-CM | POA: Insufficient documentation

## 2016-10-13 DIAGNOSIS — I10 Essential (primary) hypertension: Secondary | ICD-10-CM | POA: Insufficient documentation

## 2016-10-13 DIAGNOSIS — M79606 Pain in leg, unspecified: Secondary | ICD-10-CM | POA: Insufficient documentation

## 2016-10-13 DIAGNOSIS — Z5321 Procedure and treatment not carried out due to patient leaving prior to being seen by health care provider: Secondary | ICD-10-CM | POA: Insufficient documentation

## 2016-10-13 DIAGNOSIS — M25552 Pain in left hip: Secondary | ICD-10-CM | POA: Insufficient documentation

## 2016-10-13 DIAGNOSIS — F17210 Nicotine dependence, cigarettes, uncomplicated: Secondary | ICD-10-CM | POA: Insufficient documentation

## 2016-10-13 DIAGNOSIS — Z79899 Other long term (current) drug therapy: Secondary | ICD-10-CM | POA: Insufficient documentation

## 2016-10-13 DIAGNOSIS — E119 Type 2 diabetes mellitus without complications: Secondary | ICD-10-CM | POA: Insufficient documentation

## 2016-10-13 NOTE — ED Triage Notes (Signed)
Pt states she has had left hip pain since July states she has seen a dr  And it hurt when he examined her and he gave her vicodin  But it did not help, , she has had a difficult time  Getting around has had multiple issues with $, pt crying and yelling at triage , hurts to sleep

## 2016-10-13 NOTE — Telephone Encounter (Signed)
Please get records for neurosurgery and the specialist she mentions, set her up appt so we can review and discuss.  She may need to sign for [place] to get records.

## 2016-10-13 NOTE — Telephone Encounter (Signed)
Pt called stating that Dr Vertell Limber and Dr Luan Pulling @ pain mgmnt is not helping her. Pt said they will not do any type of testing(xray, mri,etc) to see what is going on and will not give her pain meds or give her very little.   Pt is at the ER right now and wants to know if Audelia Acton can ensure that she get mri,xray,etc while she is there or she wants to come here to get an order to go elsewhere for testing. Pt has been at ER all morning waiting.

## 2016-10-13 NOTE — Telephone Encounter (Signed)
Called and advised pt of Shane's instructions. Will have records sent here from 2 specialist

## 2016-10-14 ENCOUNTER — Encounter: Payer: Self-pay | Admitting: Medical

## 2016-10-14 ENCOUNTER — Ambulatory Visit
Admission: RE | Admit: 2016-10-14 | Discharge: 2016-10-14 | Disposition: A | Discharge status: Home / Self Care | Payer: BLUE CROSS/BLUE SHIELD | Source: Ambulatory Visit | Attending: Medical | Admitting: Medical

## 2016-10-14 ENCOUNTER — Ambulatory Visit (INDEPENDENT_AMBULATORY_CARE_PROVIDER_SITE_OTHER): Payer: BLUE CROSS/BLUE SHIELD | Admitting: Medical

## 2016-10-14 VITALS — BP 106/66 | HR 90 | Temp 98.5°F | Wt 151.6 lb

## 2016-10-14 DIAGNOSIS — G8929 Other chronic pain: Secondary | ICD-10-CM | POA: Diagnosis not present

## 2016-10-14 DIAGNOSIS — M791 Myalgia: Secondary | ICD-10-CM

## 2016-10-14 DIAGNOSIS — J019 Acute sinusitis, unspecified: Secondary | ICD-10-CM

## 2016-10-14 DIAGNOSIS — F17200 Nicotine dependence, unspecified, uncomplicated: Secondary | ICD-10-CM

## 2016-10-14 DIAGNOSIS — R52 Pain, unspecified: Secondary | ICD-10-CM

## 2016-10-14 DIAGNOSIS — M7918 Myalgia, other site: Secondary | ICD-10-CM

## 2016-10-14 DIAGNOSIS — M545 Low back pain: Secondary | ICD-10-CM

## 2016-10-14 DIAGNOSIS — W19XXXD Unspecified fall, subsequent encounter: Secondary | ICD-10-CM

## 2016-10-14 MED ORDER — DICLOFENAC SODIUM 75 MG PO TBEC: 75.0000 mg | DELAYED_RELEASE_TABLET | Freq: Two times a day (BID) | ORAL | 0 refills | Status: DC

## 2016-10-14 MED ORDER — AMOXICILLIN 500 MG PO TABS: ORAL_TABLET | ORAL | 0 refills | Status: DC

## 2016-10-14 NOTE — Progress Notes (Signed)
Subjective: Chief Complaint  Patient presents with  . having in rt buttock    having pain in her rt buttock , congestion    Here for pain in buttock.  She has hx/o chronic back pain, back surgery 2017, smoker. She has been followed by Dr. Vertell Limber with [facility] and Dr. Maryjean Ka for pain management there as well. Saw them recently.   She notes after her lumbar discectomy in 04/2016, shortly thereafter she had fallen landing on her left buttock with her foot beneath her buttock.  She reports having ongoing intermittent pains in her buttock since then, but worse in the last few weeks.   She notes Dr. Vertell Limber felt there was nothing in her back that was causing her pain, was advised it was a muscle spasm.  She ended up seeing Dr. Maryjean Ka, and reportedly had injection in her buttock that made things worse.  She is frustrated that they reportedly aren't doing anything to help her pain.  She notes that she can't afford to go to PT, and the time or times she went to PT it made things worse.  She notes no other recent fall, trauma, or injury.    currently her pain is in the left buttock, radiates down leg to back of thigh.  No leg numbness, tinging, but she notes the pain causes her to walk bent over, slow to move and get up, and having to use a cane which she has with her today.  She reportedly has been doing her "own" physical therapy program at home.  She also notes over a week history of sinus pressure, colored mucous, head pressure, irritated throat.   Wants antibiotic for this.   No other aggravating or relieving factors. No other complaint.   Past Medical History:  Diagnosis Date  . Anxiety   . Arthritis   . Bipolar 1 disorder ([place])   . Depression   . Diabetes mellitus without complication ([place])    Type II  . GERD (gastroesophageal reflux disease)   . Heart murmur    "nothing to worry about"  . HTN (hypertension)    not on medication   Current Outpatient Prescriptions on File Prior to  Visit  Medication Sig Dispense Refill  . amLODipine (NORVASC) 10 MG tablet Take 1 tablet (10 mg total) by mouth daily. 30 tablet 2  . aspirin EC 81 MG tablet Take 1 tablet (81 mg total) by mouth daily. 90 tablet 3  . buPROPion (WELLBUTRIN XL) 150 MG 24 hr tablet Take 1 tablet (150 mg total) by mouth daily. 30 tablet 2  . Insulin Detemir (LEVEMIR FLEXPEN) 100 UNIT/ML Pen Inject 15 Units into the skin daily at 10 pm. 30 mL 2  . Insulin Pen Needle (BD PEN NEEDLE NANO U/F) 32G X 4 MM MISC 1 each by Does not apply route daily. 100 each 3  . pravastatin (PRAVACHOL) 20 MG tablet Take 1 tablet (20 mg total) by mouth daily. 90 tablet 1  . QUEtiapine (SEROQUEL) 400 MG tablet TAKE 2 TABLETS (800 MG TOTAL) BY MOUTH AT BEDTIME. 180 tablet 1   No current facility-administered medications on file prior to visit.    Social History   Social History  . Marital status: Divorced    Spouse name: N/A  . Number of children: N/A  . Years of education: N/A   Occupational History  . Not on file.   Social History Main Topics  . Smoking status: Current Every Day Smoker    Packs/day:  1.00    Years: 39.00    Types: Cigarettes  . Smokeless tobacco: Never Used     Comment: pt states that she is using the E cigs and is trying to quit  . Alcohol use No  . Drug use: No     Comment: 04/06/16- last time  . Sexual activity: Yes    Birth control/ protection: Surgical   Other Topics Concern  . Not on file   Social History Narrative   Lives alone in an apartment.  Drives.  Does not work, disabled due to bipolar.    ROS as in subjective  Objective: BP 106/66   Pulse 90   Temp 98.5 F (36.9 C)   Wt 151 lb 9.6 oz (68.8 kg)   SpO2 98%   BMI 23.74 kg/m   General appearance: alert, no distress, WD/WN, AA female HEENT: normocephalic, sclerae anicteric, PERRLA, EOMi, nares with erythema, mucoid discharge ,pharynx normal Oral cavity: MMM, no lesions Neck: supple, no lymphadenopathy, no thyromegaly, no  masses Heart: RRR, normal S1, S2, no murmurs Lungs: CTA bilaterally, no wheezes, rhonchi, or rales Abdomen: +bs, soft, non tender, non distended, no masses, no hepatomegaly, no splenomegaly Back: lumbar surgical scar,  non tender, she sees guarded standing bent over, so not really able to get good idea on ROM, but it appears to be relatively normal. Musculoskeletal: tender over left buttock over sciatic notch area, lateral and posterior mid buttock, but no obvious pain with hip ROM or leg ROM other than pain reported upon standing and sitting on that area.  Otherwise legs non tender, no swelling, no obvious deformity Extremities: no edema, no cyanosis, no clubbing Pulses: 2+ symmetric, upper and lower extremities, normal cap refill Neurological: she is walking with cane, somewhat slow, bent over.  Unable to do toe and heel walk on the left.   Left leg 4-5/5/ strength.    rest of neuro exam unremarkable Psychiatric: loud at times complaining about her pain, otherwise normal affect, behavior normal, pleasant     Assessment: Encounter Diagnoses  Name Primary?  . Left buttock pain Yes  . Acute pain   . Acute non-recurrent sinusitis, unspecified location   . Chronic bilateral low back pain without sciatica   . Smoker   . Fall, subsequent encounter     Plan: I reviewed the recent neurosurgery notes from 10/07/16 with Dr. Maryjean Ka.  His notes interesting say she had pain out of proportion of contacts that day, and give previous erratic behavior a drug screen was performed showing opioids, benzo's, tricyclics, and mariajuana.   They had last given her pain medication in December, norco 5/325mg  #20 and Sterapred pack on 09/17/16.   His diagnosis was gluteal tendonitis.     I reviewed neurosurgery notes from 07/09/16 and 06/11/16 visit notes from Dr. Vertell Limber.    I agree with their recommendations and finding that she could benefit from physical therapy and pain management.     I reviewed the [place]  Controled Substance Reporting system and she has had several providers including ED, dentist, pain management prescribing pain medication.   I advised that I would not be able to prescribe her pain medication, did give her diclofenac and sent for pelvic xray to rule out any other obvious bony abnormality.   reviewed the MRI she had done 03/31/16 and 07/05/16 of lumbar spine.  Pending xray, will likely reiterated the recommendation for physical therapy and f/u with Dr. Maryjean Ka for pain management.  discussed supportive care and amoxicillin  script for sinusitis.   Advised she stop tobacco.  Kylynn was seen today for having in rt buttock.  Diagnoses and all orders for this visit:  Left buttock pain -     DG HIP UNILAT W OR W/O PELVIS 2-3 VIEWS LEFT; Future  Acute pain -     DG HIP UNILAT W OR W/O PELVIS 2-3 VIEWS LEFT; Future  Acute non-recurrent sinusitis, unspecified location  Chronic bilateral low back pain without sciatica  Smoker  Fall, subsequent encounter  Other orders -     amoxicillin (AMOXIL) 500 MG tablet; 2 tablets po BID x 10 days -     diclofenac (VOLTAREN) 75 MG EC tablet; Take 1 tablet (75 mg total) by mouth 2 (two) times daily.

## 2016-10-14 NOTE — Telephone Encounter (Signed)
Rcvd records today

## 2016-10-14 NOTE — Telephone Encounter (Signed)
Pt called and wants to know the results of her imaging. I told her that we haven't  Gotten the results back yet . That we will call her as soon as we do.

## 2016-10-15 DIAGNOSIS — M7918 Myalgia, other site: Secondary | ICD-10-CM | POA: Insufficient documentation

## 2016-10-15 DIAGNOSIS — M545 Low back pain: Secondary | ICD-10-CM

## 2016-10-15 DIAGNOSIS — W19XXXA Unspecified fall, initial encounter: Secondary | ICD-10-CM | POA: Insufficient documentation

## 2016-10-15 DIAGNOSIS — G8929 Other chronic pain: Secondary | ICD-10-CM | POA: Insufficient documentation

## 2016-10-15 DIAGNOSIS — R52 Pain, unspecified: Secondary | ICD-10-CM | POA: Insufficient documentation

## 2016-10-15 DIAGNOSIS — J019 Acute sinusitis, unspecified: Secondary | ICD-10-CM | POA: Insufficient documentation

## 2016-10-17 ENCOUNTER — Telehealth: Payer: Self-pay

## 2016-10-17 ENCOUNTER — Other Ambulatory Visit: Payer: Self-pay

## 2016-10-17 MED ORDER — INSULIN PEN NEEDLE 32G X 4 MM MISC: 1.0000 | Freq: Every day | 3 refills | Status: DC

## 2016-10-17 MED ORDER — INSULIN DETEMIR 100 UNIT/ML FLEXPEN: 15.0000 [IU] | PEN_INJECTOR | Freq: Every day | SUBCUTANEOUS | 2 refills | Status: DC

## 2016-10-17 MED ORDER — AMLODIPINE BESYLATE 10 MG PO TABS
10.0000 mg | ORAL_TABLET | Freq: Every day | ORAL | 2 refills | Status: DC
Start: 2016-10-17 — End: 2019-01-20

## 2016-10-17 MED ORDER — PRAVASTATIN SODIUM 20 MG PO TABS: 20.0000 mg | ORAL_TABLET | Freq: Every day | ORAL | 1 refills | Status: DC

## 2016-10-17 MED ORDER — QUETIAPINE FUMARATE 400 MG PO TABS: ORAL_TABLET | ORAL | 1 refills | Status: DC

## 2016-10-17 NOTE — Telephone Encounter (Signed)
I had send arthritis pill Diclofenac/Voltaren.  Any other pain med would have to be through her pain management doctor

## 2016-10-17 NOTE — Telephone Encounter (Signed)
Pt called and left a voicemail  Stated that she was not given anything for arthritis .that she didn't receive any diclofenac, I called [facility] spoke with a pharmacy tech. She said the at [facility] had pick up this meds on the 10/14/2016 . Called ms. Livsey back and l/m to let her know she should have  Have her meds.,per shane this what she needs to take for her arthritis .

## 2016-10-17 NOTE — Telephone Encounter (Signed)
Pt called l/m and wanted refills on all of her meds  Sent refills on all her meds to [facility].

## 2016-10-21 ENCOUNTER — Telehealth: Payer: Self-pay

## 2016-10-21 ENCOUNTER — Other Ambulatory Visit: Payer: Self-pay | Admitting: Medical

## 2016-10-21 ENCOUNTER — Other Ambulatory Visit: Payer: Self-pay

## 2016-10-21 DIAGNOSIS — G8929 Other chronic pain: Secondary | ICD-10-CM

## 2016-10-21 DIAGNOSIS — M545 Low back pain: Secondary | ICD-10-CM

## 2016-10-21 DIAGNOSIS — M7918 Myalgia, other site: Secondary | ICD-10-CM

## 2016-10-21 MED ORDER — DICLOFENAC SODIUM 75 MG PO TBEC: 75.0000 mg | DELAYED_RELEASE_TABLET | Freq: Two times a day (BID) | ORAL | 0 refills | Status: DC

## 2016-10-21 NOTE — Telephone Encounter (Signed)
Called dr.Zack smith office set up an appt for her on 11/05/2016 @ 815am. Spoke with patient gave her this information and the date and time and the address and phone #. Also explain to her on how to take her meds, she said that she didn't feel like it was help. I told her that if she wanted something stronger she needs to talk with pain clinic . And that we can refill the diclofenac, if she want a muscle relaxer , she said that she will call the pain clinic .

## 2016-10-21 NOTE — Telephone Encounter (Signed)
Please refer to ortho for buttock pain, history of chronic back pain, possible ultrasound of the buttock where her pain is located.  Use the orthopedist Dr. Redmond School prefers (see [place])  Let her know that I can have her try a muscle relaxer if she wants, but she should only use 1 diclofenac at a time, not 4 at a time!  Unfortunately I can't given her any "pain medication."   This has been filled by her pain management contract with Dr. Maryjean Ka, but she recently failed a drug screen there, as she had marijuana and other substances show up there which may have violated her contract with them.   Thus, I can't give her a different pain pill for buttock pain.   I did refill diclofenac, and if she wants to see if muscle relaxer will help, I can try this.

## 2016-10-21 NOTE — Telephone Encounter (Signed)
Pt called and l/m said that she is taking 4 pills  of the diclofenac at one time . She said that it not helping with pain, doesn' t understand why. She would like  a mri done to find out why. She also said that she is tired  of being in pain , and wants to get back to working, she feel like this stopping her from work and looking for a job,she wants something stronger then the diclofenac

## 2016-10-24 ENCOUNTER — Telehealth: Payer: Self-pay

## 2016-10-24 NOTE — Telephone Encounter (Signed)
Pt called saying that the pain clinic needed her last office notes and copy of x-ray . Sent this information to the pain clinic the office was closed but I l/m  To let them know that I faxed this to them. Also called the patient to let  Her know I faxed this to them.

## 2016-11-04 ENCOUNTER — Ambulatory Visit: Payer: BLUE CROSS/BLUE SHIELD | Admitting: Medical

## 2016-11-04 NOTE — Progress Notes (Signed)
[facility] [place] [address] [place], [place] [address] Phone: [phone] Subjective:    I'm seeing this patient by the request  of:  TYSINGER, DAVID Audelia Acton, PA-C  CC: Chronic low back pain  [id number]  Ashley [name] is a 56 y.o. female coming in with complaint of chronic low back pain. Past medical history significant for back surgery 2017. Patient has been seen by neurosurgery Dr. Vertell Limber as well as Dr. Maryjean Ka for pain management. Did have a lumbar discectomy July 2017. Patient did fall after the surgery and unfortunately continued to have chronic buttocks pain. Seems to be also the left side. Patient states that she was given an injection in the area that made things worse. Patient was unable to afford going to physical therapy. States that the pain can radiate down the left leg. Goes towards the back of the thigh. No chronic numbness. No weakness. Patient states though that she is having use a cane and has been trying to do some stretching at home. Patient states though that unfortunately the pain seems to be worse and is in here in a wheelchair today.  Patient did have x-rays of the left hip taken 10/14/2016. 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  patient also had an MRI after her fall. This was 07/05/2016. Patient didn't have moderate to severe bilateral L4 foraminal stenosis.  Past Medical History:  Diagnosis Date  . Anxiety   . Arthritis   . Bipolar 1 disorder ([place])   . Depression   . Diabetes mellitus without complication ([place])    Type II  . GERD (gastroesophageal reflux disease)   . Heart murmur    "nothing to worry about"  . HTN (hypertension)    not on medication   Past Surgical History:  Procedure Laterality Date  . ABDOMINAL HYSTERECTOMY    . LUMBAR LAMINECTOMY/DECOMPRESSION MICRODISCECTOMY Left 04/07/2016   Procedure: Left Lumbar four-five  Microdiskectomy;  Surgeon: Erline Levine, MD;  Location: [facility];  Service: Neurosurgery;  Laterality: Left;   Social History   Social History  . Marital status: Divorced    Spouse name: N/A  . Number of children: N/A  . Years of education: N/A   Social History Main Topics  . Smoking status: Current Every Day Smoker    Packs/day: 1.00    Years: 39.00    Types: Cigarettes  . Smokeless tobacco: Never Used     Comment: pt states that she is using the E cigs and is trying to quit  . Alcohol use No  . Drug use: No     Comment: 04/06/16- last time  . Sexual activity: Yes    Birth control/ protection: Surgical   Other Topics Concern  . None   Social History Narrative   Lives alone in an apartment.  Drives.  Does not work, disabled due to bipolar.    No Known Allergies Family History  Problem Relation Age of Onset  . Diabetes Mellitus II Father   . Diabetes Brother   . Heart disease Neg Hx   . Stroke Neg Hx   . Kidney disease Neg Hx     Past medical history, social, surgical and family history all reviewed in electronic medical record.  No pertanent information unless stated regarding to the chief complaint.   Review of Systems:Review of systems updated and as accurate as of 11/05/16 Pain positive for multiple different complaints including headaches,  chronic pain, lower back pain, abdominal discomfort that is intermittent, muscle aches and body pain.  Objective  Blood pressure 124/84, pulse 97, height 5\' 7"  (1.702 m), SpO2 98 %. Systems examined below as of 11/05/16   General: No apparent distress alert and oriented x3 mood and affect normal, dressed appropriately.  HEENT: Pupils equal, extraocular movements intact  Respiratory: Patient's speak in full sentences and does not appear short of breath  Cardiovascular: No lower extremity edema, non tender, no erythema  Skin: Warm dry intact with no signs of infection or rash on extremities or on axial skeleton.  Abdomen:  Soft nontender  Neuro: Cranial nerves II through XII are intact, neurovascularly intact in all extremities with 2+ DTRs and 2+ pulses.  Lymph: No lymphadenopathy of posterior or anterior cervical chain or axillae bilaterally.  Gait Patient declined to get out MSK:  Non tender with full range of motion and good stability and symmetric strength and tone of shoulders, elbows, wrist, hip, knee and ankles bilaterally. Lytic changes of multiple joints mild overall Back Exam:  Inspection: Unremarkable  Motion: Flexion 25 deg, Extension 15 deg, Side Bending to 45 deg bilaterally,  Rotation to 45 deg bilaterally  SLR laying: Negative tightness noted on the left side. XSLR laying: Negative  Palpable tenderness: Continued tenderness over the left ischial tuberosity area. Mild pain in the left piriformis as well.  FABER: Positive left side.. Sensory change: Gross sensation intact to all lumbar and sacral dermatomes.  Reflexes: 2+ at both patellar tendons, 2+ at achilles tendons, Babinski's downgoing.  Strength at foot  Plantar-flexion: 5/5 Dorsi-flexion: 5/5 Eversion: 5/5 Inversion: 5/5  Leg strength  Quad: 5/5 Hamstring: 5/5 Hip flexor: 5/5 Hip abductors: 5/5  Patient declined getting out of the wheelchair today..   procedure note [id number]; 15 minutes spent for Therapeutic exercises as stated in above notes.  This included exercises focusing on stretching, strengthening, with significant focus on eccentric aspects.   Low back exercises that included:  Pelvic tilt/bracing instruction to focus on control of the pelvic girdle and lower abdominal muscles  Glute strengthening exercises, focusing on proper firing of the glutes without engaging the low back muscles Proper stretching techniques for maximum relief for the hamstrings, hip flexors, low back and some rotation where tolerated  Proper technique shown and discussed handout in great detail with ATC.  All questions were discussed and answered.    Impression and Recommendations:     This case required medical decision making of moderate complexity.      Note: This dictation was prepared with Dragon dictation along with smaller phrase technology. Any transcriptional errors that result from this process are unintentional.

## 2016-11-05 ENCOUNTER — Ambulatory Visit (INDEPENDENT_AMBULATORY_CARE_PROVIDER_SITE_OTHER): Payer: BLUE CROSS/BLUE SHIELD | Admitting: Family Medicine

## 2016-11-05 ENCOUNTER — Encounter: Payer: Self-pay | Admitting: Family Medicine

## 2016-11-05 DIAGNOSIS — M791 Myalgia: Secondary | ICD-10-CM

## 2016-11-05 DIAGNOSIS — M7918 Myalgia, other site: Secondary | ICD-10-CM

## 2016-11-05 MED ORDER — GABAPENTIN 100 MG PO CAPS: 200.0000 mg | ORAL_CAPSULE | Freq: Every day | ORAL | 3 refills | Status: DC

## 2016-11-05 MED ORDER — VITAMIN D (ERGOCALCIFEROL) 1.25 MG (50000 UNIT) PO CAPS: 50000.0000 [IU] | ORAL_CAPSULE | ORAL | 0 refills | Status: DC

## 2016-11-05 NOTE — Patient Instructions (Addendum)
Good to see you  I do think physical therapy could be helpful but understand the cost and time.  Lets try gabapentin 200mg  at night.  This works on nerves may make you a little sleepy.  Once weekly vitamin D for next 12 weeks to help with muscle strength and endurance.  Try to increase activitiy as tolerated.  Ice 20 minutes 2 times daily. Usually after activity and before bed. Exercises 3 times a week.  pennsaid pinkie amount topically 2 times daily as needed.  Over the counter try  Tylenol 500mg  3 times a day  Turmeric 500mg  daily  Tart cherry extract any dose at night  See me agai nin 4 weeks.  This will be slow.

## 2016-11-05 NOTE — Assessment & Plan Note (Signed)
Patient is having more of a left buttock pain. Patient did fall. Differential includes an occult fracture, hamstring injury, piriformis injury. Patient was not willing to transition to allow for an ultrasound today. We discussed with patient at great length patient will do more of an icing protocol. Started her on a low dose of gabapentin for any radicular symptoms. Once weekly vitamin D to help with muscle strength as well as any bone healing. Discussed with patient to increase activity. Work with Product/process development scientist to learn home exercises in greater detail. Follow-up again in 3 weeks. At that time we'll encourage again about imaging with a ultrasound no fluid patient will be more ambulatory.

## 2016-11-20 ENCOUNTER — Encounter: Payer: Self-pay | Admitting: *Deleted

## 2016-11-28 ENCOUNTER — Encounter: Payer: Self-pay | Admitting: Medical

## 2016-12-03 ENCOUNTER — Ambulatory Visit: Payer: BLUE CROSS/BLUE SHIELD | Admitting: Family Medicine

## 2017-04-13 ENCOUNTER — Other Ambulatory Visit: Payer: Self-pay | Admitting: Physician Assistant

## 2017-04-13 DIAGNOSIS — M25552 Pain in left hip: Secondary | ICD-10-CM

## 2017-04-24 ENCOUNTER — Other Ambulatory Visit: Payer: BLUE CROSS/BLUE SHIELD

## 2017-04-28 ENCOUNTER — Ambulatory Visit
Admission: RE | Admit: 2017-04-28 | Discharge: 2017-04-28 | Disposition: A | Discharge status: Home / Self Care | Payer: BLUE CROSS/BLUE SHIELD | Source: Ambulatory Visit | Attending: Physician Assistant | Admitting: Physician Assistant

## 2017-04-28 DIAGNOSIS — M25552 Pain in left hip: Secondary | ICD-10-CM

## 2017-04-29 ENCOUNTER — Emergency Department (HOSPITAL_COMMUNITY)
Admission: EM | Admit: 2017-04-29 | Discharge: 2017-04-29 | Disposition: A | Discharge status: Home / Self Care | Payer: BLUE CROSS/BLUE SHIELD | Attending: Emergency Medicine | Admitting: Emergency Medicine

## 2017-04-29 ENCOUNTER — Encounter (HOSPITAL_COMMUNITY): Payer: Self-pay | Admitting: *Deleted

## 2017-04-29 DIAGNOSIS — E119 Type 2 diabetes mellitus without complications: Secondary | ICD-10-CM | POA: Diagnosis not present

## 2017-04-29 DIAGNOSIS — Z8673 Personal history of transient ischemic attack (TIA), and cerebral infarction without residual deficits: Secondary | ICD-10-CM | POA: Insufficient documentation

## 2017-04-29 DIAGNOSIS — M25552 Pain in left hip: Secondary | ICD-10-CM | POA: Insufficient documentation

## 2017-04-29 DIAGNOSIS — Z794 Long term (current) use of insulin: Secondary | ICD-10-CM | POA: Diagnosis not present

## 2017-04-29 DIAGNOSIS — Z79899 Other long term (current) drug therapy: Secondary | ICD-10-CM | POA: Diagnosis not present

## 2017-04-29 DIAGNOSIS — Z7982 Long term (current) use of aspirin: Secondary | ICD-10-CM | POA: Insufficient documentation

## 2017-04-29 DIAGNOSIS — F17210 Nicotine dependence, cigarettes, uncomplicated: Secondary | ICD-10-CM | POA: Diagnosis not present

## 2017-04-29 DIAGNOSIS — I10 Essential (primary) hypertension: Secondary | ICD-10-CM | POA: Insufficient documentation

## 2017-04-29 MED ORDER — DEXAMETHASONE SODIUM PHOSPHATE 10 MG/ML IJ SOLN
10.0000 mg | Freq: Once | INTRAMUSCULAR | Status: AC
  Administered 2017-04-29: 10 mg via INTRAMUSCULAR
  Filled 2017-04-29: qty 1

## 2017-04-29 MED ORDER — OXYCODONE-ACETAMINOPHEN 5-325 MG PO TABS
2.0000 | ORAL_TABLET | Freq: Once | ORAL | Status: AC
  Administered 2017-04-29: 2 via ORAL
  Filled 2017-04-29: qty 2

## 2017-04-29 NOTE — ED Triage Notes (Signed)
Pt states she is here for L hip pain X 1 YEAR.  She needs to have an MRI, but cannot sit still for it and once she  Was tx here and was given a shot and was able to have the MRI.

## 2017-04-29 NOTE — ED Notes (Signed)
Pt stated she needs to leave to go get outpatient MRI at 0900. Stated she "needs to go now, can't wait for papers".

## 2017-04-29 NOTE — ED Provider Notes (Signed)
[facility] DEPT Provider Note   CSN: [id number] Arrival date & time: 04/29/17  0744     History   Chief Complaint Chief Complaint  Patient presents with  . Hip Pain    HPI Ashley [name] is a 56 y.o. female.  The history is provided by the patient and medical records.  Hip Pain      57 year old female with history of anxiety, arthritis, bipolar disorder, depression, diabetes, GERD, hypertension, presenting to the ED with chronic left hip pain. 4 she fell over a year ago and has had ongoing issues with her left hip since.  States she has seen multiple physicians and was ultimately scheduled for an MRI of her left hip yesterday, but was unable to lay still to have this done. States she came here today hoping to have treatment and to have her MRI performed in hospital. She's not had any new symptoms, rather just ongoing pain with intermittent paresthesias of the left thigh. She's not had any new bowel or bladder incontinence.  No fever, chills, sweats.  Patient currently enrolled in pain management.  Past Medical History:  Diagnosis Date  . Anxiety   . Arthritis   . Bipolar 1 disorder ([place])   . Depression   . Diabetes mellitus without complication ([place])    Type II  . GERD (gastroesophageal reflux disease)   . Heart murmur    "nothing to worry about"  . HTN (hypertension)    not on medication    Patient Active Problem List   Diagnosis Date Noted  . Left buttock pain 10/15/2016  . Acute pain 10/15/2016  . Acute non-recurrent sinusitis 10/15/2016  . Chronic bilateral low back pain without sciatica 10/15/2016  . Fall 10/15/2016  . Heart murmur 08/04/2016  . Essential hypertension 08/04/2016  . Mixed dyslipidemia 08/04/2016  . History of stroke 07/02/2016  . Renal insufficiency 07/02/2016  . Bipolar affective disorder in remission ([place]) 07/02/2016  . Vaccine refused by patient 07/02/2016  . Noncompliance 07/02/2016  . Herniated lumbar intervertebral disc  04/07/2016  . 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 CVA (cerebral infarction), anteriori left basal ganglia lacunar infarct 05/20/2013  . Marijuana smoker 05/20/2013  . Smoker 05/20/2013  . Syncope and collapse 05/20/2013  . Acute kidney injury ([place]) 05/20/2013  . Dehydration 05/20/2013  . Hot flashes, menopausal 05/20/2013  . RBBB 05/20/2013  . Normocytic anemia 05/20/2013  . Thrombocytosis ([place]) 05/20/2013  . Depression 05/02/2013  . Colitis presumed infectious 05/01/2013  . Uncontrolled type 2 diabetes mellitus ([place]) 05/01/2013  . Hyperlipidemia 05/01/2013    Past Surgical History:  Procedure Laterality Date  . ABDOMINAL HYSTERECTOMY    . LUMBAR LAMINECTOMY/DECOMPRESSION MICRODISCECTOMY Left 04/07/2016   Procedure: Left Lumbar four-five Microdiskectomy;  Surgeon: Erline Levine, MD;  Location: [facility];  Service: Neurosurgery;  Laterality: Left;    OB History    Gravida Para Term Preterm AB Living   4 3 2   1 4    SAB TAB Ectopic Multiple Live Births         1 4       Home Medications    Prior to Admission medications   Medication Sig Start Date End Date Taking? Authorizing Provider  amLODipine (NORVASC) 10 MG tablet Take 1 tablet (10 mg total) by mouth daily. 10/17/16   Tysinger, Camelia Eng, PA-C  aspirin EC 81 MG tablet Take 1 tablet (81 mg total) by mouth daily. 07/03/16   Tysinger, Camelia Eng, PA-C  gabapentin (NEURONTIN) 100  MG capsule Take 2 capsules (200 mg total) by mouth at bedtime. 11/05/16   Lyndal Pulley, DO  Insulin Detemir (LEVEMIR FLEXPEN) 100 UNIT/ML Pen Inject 15 Units into the skin daily at 10 pm. 10/17/16   Tysinger, Camelia Eng, PA-C  Insulin Pen Needle (BD PEN NEEDLE NANO U/F) 32G X 4 MM MISC 1 each by Does not apply route daily. 10/17/16   Tysinger, Camelia Eng, PA-C  pravastatin (PRAVACHOL) 20 MG tablet Take 1 tablet (20 mg total) by mouth daily. 10/17/16   Tysinger, Camelia Eng, PA-C  QUEtiapine (SEROQUEL) 400 MG tablet TAKE 2 TABLETS (800 MG TOTAL) BY MOUTH  AT BEDTIME. 10/17/16   Tysinger, Camelia Eng, PA-C  Vitamin D, Ergocalciferol, (DRISDOL) 50000 units CAPS capsule Take 1 capsule (50,000 Units total) by mouth every 7 (seven) days. 11/05/16   Lyndal Pulley, DO    Family History Family History  Problem Relation Age of Onset  . Diabetes Mellitus II Father   . Diabetes Brother   . Heart disease Neg Hx   . Stroke Neg Hx   . Kidney disease Neg Hx     Social History Social History  Substance Use Topics  . Smoking status: Current Every Day Smoker    Packs/day: 1.00    Years: 39.00    Types: Cigarettes  . Smokeless tobacco: Never Used     Comment: pt states that she is using the E cigs and is trying to quit  . Alcohol use No     Allergies   Patient has no known allergies.   Review of Systems Review of Systems  Musculoskeletal: Positive for arthralgias.  All other systems reviewed and are negative.    Physical Exam Updated Vital Signs BP (!) 133/100 (BP Location: Right Arm)   Temp 97.9 F (36.6 C) (Oral)   Resp 16   Ht 5\' 7"  (1.702 m)   Wt 73.9 kg (163 lb)   SpO2 98%   BMI 25.53 kg/m   Physical Exam  Constitutional: She is oriented to person, place, and time. She appears well-developed and well-nourished.  HENT:  Head: Normocephalic and atraumatic.  Mouth/Throat: Oropharynx is clear and moist.  Eyes: Pupils are equal, round, and reactive to light. Conjunctivae and EOM are normal.  Neck: Normal range of motion.  Cardiovascular: Normal rate, regular rhythm and normal heart sounds.   Pulmonary/Chest: Effort normal and breath sounds normal.  Abdominal: Soft. Bowel sounds are normal.  Musculoskeletal: Normal range of motion.  Tenderness of left posterior hip and buttock without noted bony deformity or swelling, there is pain with attempted range of motion, normal strength and sensation of left leg, ambulatory with cane  Neurological: She is alert and oriented to person, place, and time.  Skin: Skin is warm and dry.    Psychiatric: She has a normal mood and affect.  Nursing note and vitals reviewed.    ED Treatments / Results  Labs (all labs ordered are listed, but only abnormal results are displayed) Labs Reviewed - No data to display  EKG  EKG Interpretation None       Radiology No results found.  Procedures Procedures (including critical care time)  Medications Ordered in ED Medications - No data to display   Initial Impression / Assessment and Plan / ED Course  I have reviewed the triage vital signs and the nursing notes.  Pertinent labs & imaging results that were available during my care of the patient were reviewed by me and considered in my  medical decision making (see chart for details).  56 year old female here with chronic left hip pain. Is currently enrolled in pain management for this. Has seen multiple physicians, ultimately scheduled for outpatient MRI yesterday. Reports she was unable to sit still, hoping to come here today for treatment and have MRI performed. She has had left hip pain for greater than one year. She has no new focal neurologic deficits today, no new injuries or trauma. Her symptoms seem very consistent with her prior notes. I had a discussion with patient that I do not feel her MRI needs to be done in the emergency room today as she has not had any acute changes in his had symptoms for over a year now. I've offered her medications here and informed her that she may go back to [facility] imaging to have this done afterwards. She is on the phone calling for appointment currently.  8:57 AM Patient treated here.  Has rescheduled her MRI for this morning at imaging center.  Will go there now.  She will follow-up with ordering physician. She understands indications to return to the ED.  Patient discharged in stable condition. Remains ambulatory with her pain.  Final Clinical Impressions(s) / ED Diagnoses   Final diagnoses:  Left hip pain    New  Prescriptions New Prescriptions   No medications on file     Larene Pickett, PA-C 04/29/17 5301    Duffy Bruce, MD 04/30/17 1426

## 2017-05-07 ENCOUNTER — Other Ambulatory Visit: Payer: Self-pay | Admitting: Physician Assistant

## 2017-05-07 DIAGNOSIS — M25552 Pain in left hip: Secondary | ICD-10-CM

## 2017-05-10 ENCOUNTER — Ambulatory Visit
Admission: RE | Admit: 2017-05-10 | Discharge: 2017-05-10 | Disposition: A | Discharge status: Home / Self Care | Payer: BLUE CROSS/BLUE SHIELD | Source: Ambulatory Visit | Attending: Physician Assistant | Admitting: Physician Assistant

## 2017-05-10 ENCOUNTER — Other Ambulatory Visit: Payer: BLUE CROSS/BLUE SHIELD

## 2017-05-10 DIAGNOSIS — M25552 Pain in left hip: Secondary | ICD-10-CM

## 2017-05-17 ENCOUNTER — Encounter (HOSPITAL_COMMUNITY): Payer: Self-pay

## 2017-05-17 ENCOUNTER — Emergency Department (HOSPITAL_COMMUNITY): Payer: BLUE CROSS/BLUE SHIELD

## 2017-05-17 ENCOUNTER — Inpatient Hospital Stay (HOSPITAL_COMMUNITY)
Admission: EM | Admit: 2017-05-17 | Discharge: 2017-05-19 | DRG: 871 | Disposition: A | Discharge status: Home / Self Care | Payer: BLUE CROSS/BLUE SHIELD | Attending: Family Medicine | Admitting: Family Medicine

## 2017-05-17 DIAGNOSIS — G9341 Metabolic encephalopathy: Secondary | ICD-10-CM | POA: Diagnosis present

## 2017-05-17 DIAGNOSIS — Z90710 Acquired absence of both cervix and uterus: Secondary | ICD-10-CM | POA: Diagnosis not present

## 2017-05-17 DIAGNOSIS — A419 Sepsis, unspecified organism: Secondary | ICD-10-CM | POA: Diagnosis present

## 2017-05-17 DIAGNOSIS — J189 Pneumonia, unspecified organism: Secondary | ICD-10-CM | POA: Diagnosis present

## 2017-05-17 DIAGNOSIS — I4510 Unspecified right bundle-branch block: Secondary | ICD-10-CM | POA: Diagnosis present

## 2017-05-17 DIAGNOSIS — J181 Lobar pneumonia, unspecified organism: Secondary | ICD-10-CM | POA: Diagnosis not present

## 2017-05-17 DIAGNOSIS — J440 Chronic obstructive pulmonary disease with acute lower respiratory infection: Secondary | ICD-10-CM | POA: Diagnosis present

## 2017-05-17 DIAGNOSIS — F319 Bipolar disorder, unspecified: Secondary | ICD-10-CM | POA: Diagnosis present

## 2017-05-17 DIAGNOSIS — E872 Acidosis: Secondary | ICD-10-CM | POA: Diagnosis present

## 2017-05-17 DIAGNOSIS — F17200 Nicotine dependence, unspecified, uncomplicated: Secondary | ICD-10-CM | POA: Diagnosis present

## 2017-05-17 DIAGNOSIS — J441 Chronic obstructive pulmonary disease with (acute) exacerbation: Secondary | ICD-10-CM | POA: Diagnosis present

## 2017-05-17 DIAGNOSIS — J9602 Acute respiratory failure with hypercapnia: Secondary | ICD-10-CM | POA: Diagnosis present

## 2017-05-17 DIAGNOSIS — F1910 Other psychoactive substance abuse, uncomplicated: Secondary | ICD-10-CM | POA: Diagnosis not present

## 2017-05-17 DIAGNOSIS — F1410 Cocaine abuse, uncomplicated: Secondary | ICD-10-CM | POA: Diagnosis present

## 2017-05-17 DIAGNOSIS — F17210 Nicotine dependence, cigarettes, uncomplicated: Secondary | ICD-10-CM | POA: Diagnosis present

## 2017-05-17 DIAGNOSIS — J9601 Acute respiratory failure with hypoxia: Secondary | ICD-10-CM | POA: Diagnosis present

## 2017-05-17 DIAGNOSIS — Z7982 Long term (current) use of aspirin: Secondary | ICD-10-CM | POA: Diagnosis not present

## 2017-05-17 DIAGNOSIS — I161 Hypertensive emergency: Secondary | ICD-10-CM | POA: Diagnosis present

## 2017-05-17 DIAGNOSIS — J9621 Acute and chronic respiratory failure with hypoxia: Secondary | ICD-10-CM | POA: Diagnosis not present

## 2017-05-17 DIAGNOSIS — M1990 Unspecified osteoarthritis, unspecified site: Secondary | ICD-10-CM | POA: Diagnosis present

## 2017-05-17 DIAGNOSIS — Z794 Long term (current) use of insulin: Secondary | ICD-10-CM | POA: Diagnosis not present

## 2017-05-17 DIAGNOSIS — F1210 Cannabis abuse, uncomplicated: Secondary | ICD-10-CM | POA: Diagnosis present

## 2017-05-17 DIAGNOSIS — Z8673 Personal history of transient ischemic attack (TIA), and cerebral infarction without residual deficits: Secondary | ICD-10-CM

## 2017-05-17 DIAGNOSIS — I10 Essential (primary) hypertension: Secondary | ICD-10-CM | POA: Diagnosis present

## 2017-05-17 DIAGNOSIS — Z833 Family history of diabetes mellitus: Secondary | ICD-10-CM

## 2017-05-17 DIAGNOSIS — E1165 Type 2 diabetes mellitus with hyperglycemia: Secondary | ICD-10-CM | POA: Diagnosis present

## 2017-05-17 DIAGNOSIS — R0602 Shortness of breath: Secondary | ICD-10-CM | POA: Diagnosis present

## 2017-05-17 DIAGNOSIS — K219 Gastro-esophageal reflux disease without esophagitis: Secondary | ICD-10-CM | POA: Diagnosis present

## 2017-05-17 DIAGNOSIS — E785 Hyperlipidemia, unspecified: Secondary | ICD-10-CM | POA: Diagnosis not present

## 2017-05-17 LAB — I-STAT ARTERIAL BLOOD GAS, ED
ACID-BASE DEFICIT: 5 mmol/L — AB (ref 0.0–2.0)
Acid-base deficit: 8 mmol/L — ABNORMAL HIGH (ref 0.0–2.0)
Acid-base deficit: 7 mmol/L — ABNORMAL HIGH (ref 0.0–2.0)
BICARBONATE: 21.6 mmol/L (ref 20.0–28.0)
BICARBONATE: 22.5 mmol/L (ref 20.0–28.0)
Bicarbonate: 20.8 mmol/L (ref 20.0–28.0)
O2 SAT: 94 %
O2 SAT: 97 %
O2 Saturation: 97 %
PCO2 ART: 53.7 mmHg — AB (ref 32.0–48.0)
PCO2 ART: 50.4 mmHg — AB (ref 32.0–48.0)
PH ART: 7.223 — AB (ref 7.350–7.450)
PO2 ART: 104 mmHg (ref 83.0–108.0)
PO2 ART: 109 mmHg — AB (ref 83.0–108.0)
Patient temperature: 98.6
Patient temperature: 98.6
TCO2: 23 mmol/L (ref 0–100)
TCO2: 24 mmol/L (ref 0–100)
TCO2: 22 mmol/L (ref 0–100)
pCO2 arterial: 58.7 mmHg — ABNORMAL HIGH (ref 32.0–48.0)
pH, Arterial: 7.176 — CL (ref 7.350–7.450)
pH, Arterial: 7.231 — ABNORMAL LOW (ref 7.350–7.450)
pO2, Arterial: 90 mmHg (ref 83.0–108.0)

## 2017-05-17 LAB — BASIC METABOLIC PANEL
ANION GAP: 12 (ref 5–15)
BUN: 8 mg/dL (ref 6–20)
CALCIUM: 9.9 mg/dL (ref 8.9–10.3)
CO2: 21 mmol/L — AB (ref 22–32)
CREATININE: 0.8 mg/dL (ref 0.44–1.00)
Chloride: 104 mmol/L (ref 101–111)
GFR calc Af Amer: >60 mL/min (ref >60)
Glucose, Bld: 319 mg/dL — ABNORMAL HIGH (ref 65–99)
Potassium: 3.7 mmol/L (ref 3.5–5.1)
Sodium: 137 mmol/L (ref 135–145)

## 2017-05-17 LAB — CBC
HCT: 37.4 % (ref 36.0–46.0)
Hemoglobin: 12.7 g/dL (ref 12.0–15.0)
MCH: 29.7 pg (ref 26.0–34.0)
MCHC: 34 g/dL (ref 30.0–36.0)
MCV: 87.6 fL (ref 78.0–100.0)
PLATELETS: 380 10*3/uL (ref 150–400)
RBC: 4.27 MIL/uL (ref 3.87–5.11)
RDW: 14.8 % (ref 11.5–15.5)
WBC: 16 10*3/uL — ABNORMAL HIGH (ref 4.0–10.5)

## 2017-05-17 LAB — RAPID URINE DRUG SCREEN, HOSP PERFORMED
AMPHETAMINES: NOT DETECTED
BARBITURATES: NOT DETECTED
BENZODIAZEPINES: NOT DETECTED
Cocaine: POSITIVE — AB
Opiates: NOT DETECTED
Tetrahydrocannabinol: POSITIVE — AB

## 2017-05-17 LAB — HEPATIC FUNCTION PANEL
ALBUMIN: 4 g/dL (ref 3.5–5.0)
ALK PHOS: 115 U/L (ref 38–126)
ALT: 18 U/L (ref 14–54)
AST: 21 U/L (ref 15–41)
BILIRUBIN TOTAL: 0.3 mg/dL (ref 0.3–1.2)
Bilirubin, Direct: <0.1 mg/dL — ABNORMAL LOW (ref 0.1–0.5)
Total Protein: 8.2 g/dL — ABNORMAL HIGH (ref 6.5–8.1)

## 2017-05-17 LAB — ACETAMINOPHEN LEVEL: Acetaminophen (Tylenol), Serum: <10 ug/mL — ABNORMAL LOW (ref 10–30)

## 2017-05-17 LAB — BRAIN NATRIURETIC PEPTIDE: B NATRIURETIC PEPTIDE 5: 58.9 pg/mL (ref 0.0–100.0)

## 2017-05-17 LAB — T4, FREE: Free T4: 0.74 ng/dL (ref 0.61–1.12)

## 2017-05-17 LAB — I-STAT TROPONIN, ED: TROPONIN I, POC: 0.03 ng/mL (ref 0.00–0.08)

## 2017-05-17 LAB — TSH: TSH: 0.178 u[IU]/mL — AB (ref 0.350–4.500)

## 2017-05-17 LAB — AMMONIA: Ammonia: 31 umol/L (ref 9–35)

## 2017-05-17 LAB — SALICYLATE LEVEL: Salicylate Lvl: <7 mg/dL (ref 2.8–30.0)

## 2017-05-17 LAB — I-STAT CG4 LACTIC ACID, ED: LACTIC ACID, VENOUS: 1.39 mmol/L (ref 0.5–1.9)

## 2017-05-17 LAB — ETHANOL: Alcohol, Ethyl (B): <5 mg/dL (ref <5)

## 2017-05-17 MED ORDER — PRAVASTATIN SODIUM 40 MG PO TABS
20.0000 mg | ORAL_TABLET | Freq: Every day | ORAL | Status: DC
  Administered 2017-05-18: 20 mg via ORAL
  Filled 2017-05-17: qty 1

## 2017-05-17 MED ORDER — LEVALBUTEROL HCL 0.63 MG/3ML IN NEBU: 0.6300 mg | INHALATION_SOLUTION | RESPIRATORY_TRACT | Status: DC

## 2017-05-17 MED ORDER — LEVALBUTEROL HCL 0.63 MG/3ML IN NEBU
0.6300 mg | INHALATION_SOLUTION | RESPIRATORY_TRACT | Status: DC | PRN
  Administered 2017-05-17: 0.63 mg via RESPIRATORY_TRACT

## 2017-05-17 MED ORDER — VANCOMYCIN HCL IN DEXTROSE 1-5 GM/200ML-% IV SOLN
1000.0000 mg | Freq: Once | INTRAVENOUS | Status: AC
  Administered 2017-05-17: 1000 mg via INTRAVENOUS
  Filled 2017-05-17: qty 200

## 2017-05-17 MED ORDER — INSULIN ASPART 100 UNIT/ML ~~LOC~~ SOLN: 0.0000 [IU] | Freq: Three times a day (TID) | SUBCUTANEOUS | Status: DC

## 2017-05-17 MED ORDER — LORAZEPAM 2 MG/ML IJ SOLN
1.0000 mg | Freq: Once | INTRAMUSCULAR | Status: AC
  Administered 2017-05-17: 1 mg via INTRAVENOUS

## 2017-05-17 MED ORDER — LEVALBUTEROL HCL 1.25 MG/0.5ML IN NEBU: 1.2500 mg | INHALATION_SOLUTION | Freq: Four times a day (QID) | RESPIRATORY_TRACT | Status: DC

## 2017-05-17 MED ORDER — SODIUM CHLORIDE 0.9 % IV BOLUS (SEPSIS)
1000.0000 mL | Freq: Once | INTRAVENOUS | Status: AC
  Administered 2017-05-17: 1000 mL via INTRAVENOUS

## 2017-05-17 MED ORDER — PIPERACILLIN-TAZOBACTAM 3.375 G IVPB 30 MIN
3.3750 g | Freq: Once | INTRAVENOUS | Status: AC
  Administered 2017-05-17: 3.375 g via INTRAVENOUS
  Filled 2017-05-17: qty 50

## 2017-05-17 MED ORDER — ONDANSETRON HCL 4 MG/2ML IJ SOLN: 4.0000 mg | Freq: Three times a day (TID) | INTRAMUSCULAR | Status: DC | PRN

## 2017-05-17 MED ORDER — LORAZEPAM 2 MG/ML IJ SOLN
0.5000 mg | Freq: Once | INTRAMUSCULAR | Status: AC
  Administered 2017-05-17: 0.5 mg via INTRAVENOUS
  Filled 2017-05-17: qty 1

## 2017-05-17 MED ORDER — ACETAMINOPHEN 325 MG PO TABS
650.0000 mg | ORAL_TABLET | Freq: Four times a day (QID) | ORAL | Status: DC | PRN
  Administered 2017-05-18: 650 mg via ORAL
  Filled 2017-05-17: qty 2

## 2017-05-17 MED ORDER — HYDRALAZINE HCL 20 MG/ML IJ SOLN: 5.0000 mg | INTRAMUSCULAR | Status: DC | PRN

## 2017-05-17 MED ORDER — LORAZEPAM 2 MG/ML IJ SOLN
INTRAMUSCULAR | Status: AC
  Filled 2017-05-17: qty 1

## 2017-05-17 MED ORDER — GABAPENTIN 100 MG PO CAPS
200.0000 mg | ORAL_CAPSULE | Freq: Every day | ORAL | Status: DC
  Administered 2017-05-18 (×2): 200 mg via ORAL
  Filled 2017-05-17 (×2): qty 2

## 2017-05-17 MED ORDER — DEXTROSE 5 % IV SOLN
500.0000 mg | INTRAVENOUS | Status: DC
  Administered 2017-05-18: 500 mg via INTRAVENOUS
  Filled 2017-05-17: qty 500

## 2017-05-17 MED ORDER — AMLODIPINE BESYLATE 10 MG PO TABS
10.0000 mg | ORAL_TABLET | Freq: Every day | ORAL | Status: DC
  Administered 2017-05-18 – 2017-05-19 (×3): 10 mg via ORAL
  Filled 2017-05-17 (×3): qty 1

## 2017-05-17 MED ORDER — DEXTROSE 5 % IV SOLN
1.0000 g | INTRAVENOUS | Status: DC
  Administered 2017-05-18: 1 g via INTRAVENOUS
  Filled 2017-05-17: qty 10

## 2017-05-17 MED ORDER — LEVALBUTEROL HCL 0.63 MG/3ML IN NEBU
INHALATION_SOLUTION | RESPIRATORY_TRACT | Status: AC
  Administered 2017-05-18
  Filled 2017-05-17: qty 3

## 2017-05-17 MED ORDER — LEVALBUTEROL HCL 1.25 MG/0.5ML IN NEBU: 1.2500 mg | INHALATION_SOLUTION | Freq: Once | RESPIRATORY_TRACT | Status: DC

## 2017-05-17 MED ORDER — INSULIN DETEMIR 100 UNIT/ML ~~LOC~~ SOLN
10.0000 [IU] | Freq: Every day | SUBCUTANEOUS | Status: DC
  Administered 2017-05-18: 10 [IU] via SUBCUTANEOUS
  Filled 2017-05-17: qty 0.1

## 2017-05-17 MED ORDER — DEXTROSE 5 % IV SOLN
1.0000 g | Freq: Once | INTRAVENOUS | Status: AC
  Administered 2017-05-18: 1 g via INTRAVENOUS
  Filled 2017-05-17: qty 10

## 2017-05-17 MED ORDER — IOPAMIDOL (ISOVUE-370) INJECTION 76%
INTRAVENOUS | Status: AC
  Filled 2017-05-17: qty 100

## 2017-05-17 MED ORDER — METHYLPREDNISOLONE SODIUM SUCC 125 MG IJ SOLR
125.0000 mg | Freq: Once | INTRAMUSCULAR | Status: AC
  Administered 2017-05-17: 125 mg via INTRAVENOUS
  Filled 2017-05-17: qty 2

## 2017-05-17 MED ORDER — OXYCODONE-ACETAMINOPHEN 7.5-325 MG PO TABS: 1.0000 | ORAL_TABLET | Freq: Four times a day (QID) | ORAL | Status: DC | PRN

## 2017-05-17 MED ORDER — ASPIRIN EC 81 MG PO TBEC
81.0000 mg | DELAYED_RELEASE_TABLET | Freq: Every day | ORAL | Status: DC
  Administered 2017-05-18 – 2017-05-19 (×2): 81 mg via ORAL
  Filled 2017-05-17 (×2): qty 1

## 2017-05-17 MED ORDER — DM-GUAIFENESIN ER 30-600 MG PO TB12
1.0000 | ORAL_TABLET | Freq: Two times a day (BID) | ORAL | Status: DC
  Administered 2017-05-18 – 2017-05-19 (×4): 1 via ORAL
  Filled 2017-05-17 (×4): qty 1

## 2017-05-17 MED ORDER — METHYLPREDNISOLONE SODIUM SUCC 125 MG IJ SOLR: 60.0000 mg | Freq: Three times a day (TID) | INTRAMUSCULAR | Status: DC

## 2017-05-17 MED ORDER — NICOTINE 21 MG/24HR TD PT24
21.0000 mg | MEDICATED_PATCH | Freq: Every day | TRANSDERMAL | Status: DC
  Administered 2017-05-18 – 2017-05-19 (×3): 21 mg via TRANSDERMAL
  Filled 2017-05-17 (×2): qty 1

## 2017-05-17 MED ORDER — SODIUM CHLORIDE 0.9 % IV SOLN
INTRAVENOUS | Status: DC
  Administered 2017-05-18: 01:00:00 via INTRAVENOUS

## 2017-05-17 MED ORDER — LEVALBUTEROL HCL 1.25 MG/0.5ML IN NEBU
1.2500 mg | INHALATION_SOLUTION | Freq: Once | RESPIRATORY_TRACT | Status: AC
  Administered 2017-05-17: 1.25 mg via RESPIRATORY_TRACT
  Filled 2017-05-17: qty 0.5

## 2017-05-17 MED ORDER — HYDRALAZINE HCL 20 MG/ML IJ SOLN
10.0000 mg | Freq: Once | INTRAMUSCULAR | Status: AC
  Administered 2017-05-18: 10 mg via INTRAVENOUS
  Filled 2017-05-17: qty 1

## 2017-05-17 MED ORDER — ENOXAPARIN SODIUM 40 MG/0.4ML ~~LOC~~ SOLN
40.0000 mg | SUBCUTANEOUS | Status: DC
  Administered 2017-05-18 – 2017-05-19 (×2): 40 mg via SUBCUTANEOUS
  Filled 2017-05-17 (×2): qty 0.4

## 2017-05-17 MED ORDER — QUETIAPINE FUMARATE 400 MG PO TABS
800.0000 mg | ORAL_TABLET | Freq: Every day | ORAL | Status: DC
  Filled 2017-05-17: qty 2

## 2017-05-17 MED ORDER — LEVALBUTEROL HCL 0.63 MG/3ML IN NEBU
0.6300 mg | INHALATION_SOLUTION | RESPIRATORY_TRACT | Status: DC
  Administered 2017-05-17: 0.63 mg via RESPIRATORY_TRACT
  Filled 2017-05-17 (×2): qty 3

## 2017-05-17 MED ORDER — IPRATROPIUM BROMIDE 0.02 % IN SOLN
0.5000 mg | RESPIRATORY_TRACT | Status: DC
  Administered 2017-05-17: 0.5 mg via RESPIRATORY_TRACT
  Filled 2017-05-17: qty 2.5

## 2017-05-17 NOTE — Progress Notes (Signed)
Called to setup Bipap for the patient. Pt placed on Bipap 10/5 50% but patient instantly jerked bipap off stating that she can not breathe. Pt placed back on NRB, Sp02 stable. Attending MD aware. Rn to call when pt is given Ativan to attempt bipap placement again.

## 2017-05-17 NOTE — H&P (Addendum)
History and Physical    Ashley [name] OBS:[id number] DOB: 03-Apr-1961 DOA: 05/17/2017  Referring MD/NP/PA:   PCP: Carlena Hurl, PA-C   Patient coming from:  The patient is coming from home.  At baseline, pt is independent for most of ADL.   Chief Complaint: Shortness of breath  HPI: Ashley [name] is a 56 y.o. female with medical history significant of hypertension, hyperlipidemia, diabetes mellitus, stroke, GERD, depression, bipolar disorder, polysubstance abuse (tobacco, cocaine and marijuana), who presents with shortness breath.  Patient is very drowsy, barely arousable after given dose of Ativan due to agitation in ED, and is unable to provide medical history, therefore, most of the history is obtained by discussing the case with ED physician, per EMS report, and with the nursing staff.  Per report, patient has been having shortness of breath and a palpitation for 2 days, which has been progressively getting worse. Per EDP, patient has productive cough, but no chest pain. Pt can barely speak in full sentence and obvoiusly in acute respiratory distress. Patient has temperature 99.3 in ED. He moves all extremities. No active nausea, vomiting or diarrhea noted. No leg edema noted. Pt has oxygen desaturation to 88% on 6 L nasal cannula oxygen. BiPAP is started.  ED Course: pt was found to have elevated 7.176--> 7.231 on BiPAP, PCO2 58.7--> 53.7 on BiPAP, and PO2 90--> 104 on BiPAP, WBC 16, positive UDS for cocaine and THC, lactic acid 1.39, negative troponin,  BNP 58.9, Tylenol level less than 10, salicylate level less than 7, electrolytes renal function okay, ammonia 3, alcohol level less than 5, temperature 99.3, tachycardia, tachypnea, elevated blood pressure 192/118, chest chest showed possible left lower base infiltration. Patient is admitted to stepdown as inpatient.  Review of Systems: Could not be reviewed since patient is too drowsy to answer any question.  Allergy: No Known  Allergies  Past Medical History:  Diagnosis Date  . Anxiety   . Arthritis   . Bipolar 1 disorder ([place])   . Depression   . Diabetes mellitus without complication ([place])    Type II  . GERD (gastroesophageal reflux disease)   . Heart murmur    "nothing to worry about"  . HTN (hypertension)    not on medication    Past Surgical History:  Procedure Laterality Date  . ABDOMINAL HYSTERECTOMY    . LUMBAR LAMINECTOMY/DECOMPRESSION MICRODISCECTOMY Left 04/07/2016   Procedure: Left Lumbar four-five Microdiskectomy;  Surgeon: Erline Levine, MD;  Location: [facility];  Service: Neurosurgery;  Laterality: Left;    Social History:  reports that she has been smoking Cigarettes.  She has a 39.00 pack-year smoking history. She has never used smokeless tobacco. She reports that she uses drugs, including Cocaine. She reports that she does not drink alcohol.  Family History:  Family History  Problem Relation Age of Onset  . Diabetes Mellitus II Father   . Diabetes Brother   . Heart disease Neg Hx   . Stroke Neg Hx   . Kidney disease Neg Hx      Prior to Admission medications   Medication Sig Start Date End Date Taking? Authorizing Provider  amLODipine (NORVASC) 10 MG tablet Take 1 tablet (10 mg total) by mouth daily. 10/17/16  Yes Tysinger, Camelia Eng, PA-C  aspirin EC 81 MG tablet Take 1 tablet (81 mg total) by mouth daily. 07/03/16  Yes Tysinger, Camelia Eng, PA-C  gabapentin (NEURONTIN) 100 MG capsule Take 2 capsules (200 mg total) by mouth at bedtime.  11/05/16  Yes Hulan Saas M, DO  GLIPIZIDE XL 10 MG 24 hr tablet Take 10 mg by mouth every morning. 04/16/17  Yes [provider]  Insulin Detemir (LEVEMIR FLEXPEN) 100 UNIT/ML Pen Inject 15 Units into the skin daily at 10 pm. Patient taking differently: Inject 30 Units into the skin every morning.  10/17/16  Yes Tysinger, Camelia Eng, PA-C  oxyCODONE-acetaminophen (PERCOCET) 7.5-325 MG tablet Take 1 tablet by mouth 3 (three) times daily as needed  for pain. 04/07/17  Yes [provider]  pravastatin (PRAVACHOL) 20 MG tablet Take 1 tablet (20 mg total) by mouth daily. 10/17/16  Yes Tysinger, Camelia Eng, PA-C  PROAIR HFA 108 (90 Base) MCG/ACT inhaler Inhale 1-2 puffs into the lungs every 6 (six) hours as needed for wheezing or cough. 04/29/17  Yes [provider]  QUEtiapine (SEROQUEL) 400 MG tablet TAKE 2 TABLETS (800 MG TOTAL) BY MOUTH AT BEDTIME. 10/17/16  Yes Tysinger, Camelia Eng, PA-C  Insulin Pen Needle (BD PEN NEEDLE NANO U/F) 32G X 4 MM MISC 1 each by Does not apply route daily. 10/17/16   Tysinger, Camelia Eng, PA-C  Vitamin D, Ergocalciferol, (DRISDOL) 50000 units CAPS capsule Take 1 capsule (50,000 Units total) by mouth every 7 (seven) days. Patient not taking: Reported on 05/17/2017 11/05/16   Lyndal Pulley, DO    Physical Exam: Vitals:   05/17/17 2345 05/17/17 2352 05/17/17 2357 05/18/17 0000  BP:  (!) 166/85  (!) 154/89  Pulse: (!) 117 (!) 113 (!) 112 (!) 113  Resp: (!) 28 (!) 34 (!) 29 (!) 35  Temp:      TempSrc:      SpO2: 100% 100% 100% 100%  Weight:      Height:       General: Not in acute distress HEENT:       Eyes: PERRL, EOMI, no scleral icterus.       ENT: No discharge from the ears and nose, no pharynx injection, no tonsillar enlargement.        Neck: No JVD, no bruit, no mass felt. Heme: No neck lymph node enlargement. Cardiac: S1/S2, RRR, No murmurs, No gallops or rubs. Respiratory: Has diffuse wheezing bilaterally. No rales or rubs. GI: Soft, nondistended, nontender, no organomegaly, BS present. GU: No hematuria Ext: No pitting leg edema bilaterally. 2+DP/PT pulse bilaterally. Musculoskeletal: No joint deformities, No joint redness or warmth, no limitation of ROM in spin. Skin: No rashes.  Neuro: Very drowsy, barely arousable, not oriented X3, cranial nerves II-XII grossly intact, moves all extremities. Psych: Patient is not psychotic, no suicidal or hemocidal ideation.  Labs on Admission: I have  personally reviewed following labs and imaging studies  CBC:  Recent Labs Lab 05/17/17 1632  WBC 16.0*  HGB 12.7  HCT 37.4  MCV 87.6  PLT 623   Basic Metabolic Panel:  Recent Labs Lab 05/17/17 1632  NA 137  K 3.7  CL 104  CO2 21*  GLUCOSE 319*  BUN 8  CREATININE 0.80  CALCIUM 9.9   GFR: Estimated Creatinine Clearance: 74.5 mL/min (by C-G formula based on SCr of 0.8 mg/dL). Liver Function Tests:  Recent Labs Lab 05/17/17 1632  AST 21  ALT 18  ALKPHOS 115  BILITOT 0.3  PROT 8.2*  ALBUMIN 4.0   No results for input(s): LIPASE, AMYLASE in the last 168 hours.  Recent Labs Lab 05/17/17 2057  AMMONIA 31   Coagulation Profile: No results for input(s): INR, PROTIME in the last 168 hours.  Cardiac Enzymes: No results for input(s): CKTOTAL, CKMB, CKMBINDEX, TROPONINI in the last 168 hours. BNP (last 3 results) No results for input(s): PROBNP in the last 8760 hours. HbA1C: No results for input(s): HGBA1C in the last 72 hours. CBG: No results for input(s): GLUCAP in the last 168 hours. Lipid Profile: No results for input(s): CHOL, HDL, LDLCALC, TRIG, CHOLHDL, LDLDIRECT in the last 72 hours. Thyroid Function Tests:  Recent Labs  05/17/17 2057  TSH 0.178*  FREET4 0.74   Anemia Panel: No results for input(s): VITAMINB12, FOLATE, FERRITIN, TIBC, IRON, RETICCTPCT in the last 72 hours. Urine analysis:    Component Value Date/Time   COLORURINE YELLOW 04/06/2016 1932   APPEARANCEUR CLEAR 04/06/2016 1932   LABSPEC 1.036 (H) 04/06/2016 1932   PHURINE 5.5 04/06/2016 1932   GLUCOSEU >1000 (A) 04/06/2016 1932   HGBUR NEGATIVE 04/06/2016 1932   BILIRUBINUR NEGATIVE 04/06/2016 1932   KETONESUR NEGATIVE 04/06/2016 1932   PROTEINUR NEGATIVE 04/06/2016 1932   UROBILINOGEN 0.2 11/13/2014 0942   NITRITE NEGATIVE 04/06/2016 1932   LEUKOCYTESUR NEGATIVE 04/06/2016 1932   Sepsis Labs: @LABRCNTIP (procalcitonin:4,lacticidven:4) )No results found for this or any  previous visit (from the past 240 hour(s)).   Radiological Exams on Admission: Dg Chest Port 1 View  Result Date: 05/17/2017 CLINICAL DATA:  Shortness of breath and palpitations for 2 days. EXAM: PORTABLE CHEST 1 VIEW COMPARISON:  November 13, 2014 FINDINGS: No pneumothorax. The heart, hila, and mediastinum are normal. Patchy opacity in the left base. No other pulmonary abnormalities. IMPRESSION: Patchy opacity in left base is concerning for early developing infiltrate. Recommend follow-up to resolution. No other acute abnormalities. Electronically Signed   By: Dorise Bullion III M.D   On: 05/17/2017 17:52     EKG: Independently reviewed.  Sinus rhythm, tachycardia, QTC 446, low voltage, old right bundle blockage, right axis deviation   Assessment/Plan Principal Problem:   Acute respiratory failure with hypoxia (HCC) Active Problems:   Uncontrolled type 2 diabetes mellitus ([place])   Hyperlipidemia   Smoker   RBBB   History of stroke   Essential hypertension   Polysubstance abuse   CAP (community acquired pneumonia)   Sepsis ([place])   Acute metabolic encephalopathy   Acute respiratory failure with hypoxia due to possible lobar CAP and bronchospasm and sepsis: Patient has diffuse wheezing. Chest x-ray showed possible left lower base infiltration. Pt is a smoker, she may have undiagnosed COPD exacerbation. Per EDP, no CP, less likely to have PE. 7.176--> 7.231 on BiPAP, PCO2 58.7--> 53.7 on BiPAP, and PO2 90--> 104 on BiPAP. Patient meets criteria for sepsis with leukocytosis, tachycardia and tachypnea. Lactic acid is normal. Currently hemodynamically stable  - will admit to SDU as inpt - continue BiPAP - IV Rocephin and azithromycin (pt received one dose of IV Vancomycin and Zosyn in ED).  - Mucinex for cough  - prn Albuterol Nebs, atrovent Neb prn for SOB - Solu-Medrol, 60 mg 3 times a day - Urine legionella and S. pneumococcal antigen - Follow up blood culture x2, sputum culture and  respiratory virus panel, plus Flu pcr - will get Procalcitonin and trend lactic acid level per sepsis protocol - IVF: 1L of NS bolus in ED, followed by 125 mL per hour of NS -will repeat ABG in 10 min. If no improvement, will call PCCM-->Third ABG with no further improvement, PCCM, dr. Elsworth Soho was consulted.  Uncontrolled type 2 diabetes mellitus with complications (stroke): Last A1c 9.0 on 07/02/16, poorly fairly well controled. Patient is taking  Levemir and glipizide at home -will decrease Levemir dose from 15-10 units daily  -SSI  HLD: -Continue home medications: Pravastatin -Check FLP  History of stroke: -Continue aspirin and pravastatin  Polysubstance abuse: Including tobacco, cocaine and marijuana -Need counseling about importance of quitting smoking when mental status improves -Nicotine patch  Acute metabolic encephalopathy: Likely due to multifactorial etiology, including drug drug abuse and hypoxia. -treat underlying issues as above -Frequent neurologic checks -If not improving, may consider to get CT head.   DVT ppx: SQ Lovenox Code Status: Full code Family Communication: None at bed side. Disposition Plan:  Anticipate discharge back to previous home environment Consults called:  none Admission status:  SDU/inpation       Date of Service 05/18/2017    Ivor Costa [facility] Pager [phone]  If 7PM-7AM, please contact night-coverage [email or web address] Password [email or web address] 05/18/2017, 1:15 AM

## 2017-05-17 NOTE — ED Notes (Signed)
Pt's son Remo Lipps is leaving and asked to be contacted with concerns/updates. [phone]

## 2017-05-17 NOTE — ED Provider Notes (Signed)
[facility] DEPT Provider Note   CSN: [id number] Arrival date & time: 05/17/17  1622     History   Chief Complaint Chief Complaint  Patient presents with  . Shortness of Breath    HPI Ashley [name] is a 56 y.o. female.  HPI Patient presents with acute onset shortness breath starting yesterday. States she's had cough productive of green sputum with subjective fevers and chills. Denies any chest pain. Says she's been using her nebulized treatments today with little improvement. Noted to be hypoxic in triage and placed on supplemental oxygen. Past Medical History:  Diagnosis Date  . Anxiety   . Arthritis   . Bipolar 1 disorder ([place])   . Depression   . Diabetes mellitus without complication ([place])    Type II  . GERD (gastroesophageal reflux disease)   . Heart murmur    "nothing to worry about"  . HTN (hypertension)    not on medication    Patient Active Problem List   Diagnosis Date Noted  . Acute metabolic encephalopathy 25/02/3975  . Polysubstance abuse 05/17/2017  . CAP (community acquired pneumonia) 05/17/2017  . Sepsis ([place]) 05/17/2017  . Acute respiratory failure with hypoxia ([place]) 05/17/2017  . Left buttock pain 10/15/2016  . Acute pain 10/15/2016  . Acute non-recurrent sinusitis 10/15/2016  . Chronic bilateral low back pain without sciatica 10/15/2016  . Fall 10/15/2016  . Heart murmur 08/04/2016  . Essential hypertension 08/04/2016  . Mixed dyslipidemia 08/04/2016  . History of stroke 07/02/2016  . Renal insufficiency 07/02/2016  . Bipolar affective disorder in remission ([place]) 07/02/2016  . Vaccine refused by patient 07/02/2016  . Noncompliance 07/02/2016  . Herniated lumbar intervertebral disc 04/07/2016  . 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 Cerebral infarction ([place]) 05/20/2013  . Marijuana smoker 05/20/2013  . Smoker 05/20/2013  . Syncope and collapse 05/20/2013  . Acute kidney injury ([place]) 05/20/2013  . Dehydration 05/20/2013  .  Hot flashes, menopausal 05/20/2013  . RBBB 05/20/2013  . Normocytic anemia 05/20/2013  . Thrombocytosis ([place]) 05/20/2013  . Depression 05/02/2013  . Colitis presumed infectious 05/01/2013  . Uncontrolled type 2 diabetes mellitus ([place]) 05/01/2013  . Hyperlipidemia 05/01/2013    Past Surgical History:  Procedure Laterality Date  . ABDOMINAL HYSTERECTOMY    . LUMBAR LAMINECTOMY/DECOMPRESSION MICRODISCECTOMY Left 04/07/2016   Procedure: Left Lumbar four-five Microdiskectomy;  Surgeon: Erline Levine, MD;  Location: [facility];  Service: Neurosurgery;  Laterality: Left;    OB History    Gravida Para Term Preterm AB Living   4 3 2   1 4    SAB TAB Ectopic Multiple Live Births         1 4       Home Medications    Prior to Admission medications   Medication Sig Start Date End Date Taking? Authorizing Provider  amLODipine (NORVASC) 10 MG tablet Take 1 tablet (10 mg total) by mouth daily. 10/17/16  Yes Tysinger, Camelia Eng, PA-C  aspirin EC 81 MG tablet Take 1 tablet (81 mg total) by mouth daily. 07/03/16  Yes Tysinger, Camelia Eng, PA-C  gabapentin (NEURONTIN) 100 MG capsule Take 2 capsules (200 mg total) by mouth at bedtime. 11/05/16  Yes Hulan Saas M, DO  GLIPIZIDE XL 10 MG 24 hr tablet Take 10 mg by mouth every morning. 04/16/17  Yes [provider]  Insulin Detemir (LEVEMIR FLEXPEN) 100 UNIT/ML Pen Inject 15 Units into the skin daily at 10 pm. Patient taking differently: Inject 30 Units into the  skin every morning.  10/17/16  Yes Tysinger, Camelia Eng, PA-C  oxyCODONE-acetaminophen (PERCOCET) 7.5-325 MG tablet Take 1 tablet by mouth 3 (three) times daily as needed for pain. 04/07/17  Yes [provider]  pravastatin (PRAVACHOL) 20 MG tablet Take 1 tablet (20 mg total) by mouth daily. 10/17/16  Yes Tysinger, Camelia Eng, PA-C  PROAIR HFA 108 (90 Base) MCG/ACT inhaler Inhale 1-2 puffs into the lungs every 6 (six) hours as needed for wheezing or cough. 04/29/17  Yes [provider]   QUEtiapine (SEROQUEL) 400 MG tablet TAKE 2 TABLETS (800 MG TOTAL) BY MOUTH AT BEDTIME. 10/17/16  Yes Tysinger, Camelia Eng, PA-C  budesonide-formoterol (SYMBICORT) 160-4.5 MCG/ACT inhaler Inhale 2 puffs into the lungs 2 (two) times daily. 05/19/17   Patrecia Pour, Christean Grief, MD  Insulin Pen Needle (BD PEN NEEDLE NANO U/F) 32G X 4 MM MISC 1 each by Does not apply route daily. 10/17/16   Tysinger, Camelia Eng, PA-C  levofloxacin (LEVAQUIN) 500 MG tablet Take 1 tablet (500 mg total) by mouth daily. 05/19/17 05/26/17  Doreatha Lew, MD  nicotine (NICODERM CQ - DOSED IN MG/24 HOURS) 21 mg/24hr patch Place 1 patch (21 mg total) onto the skin daily. 05/19/17   Patrecia Pour, Christean Grief, MD  predniSONE (DELTASONE) 10 MG tablet Take 4 tablets for 3 days; Take 3 tablets for 4 days; Take 2 tablets for 3 days; Take 1 tablet for 4 days 05/19/17   Patrecia Pour, Christean Grief, MD  Vitamin D, Ergocalciferol, (DRISDOL) 50000 units CAPS capsule Take 1 capsule (50,000 Units total) by mouth every 7 (seven) days. Patient not taking: Reported on 05/17/2017 11/05/16   Lyndal Pulley, DO    Family History Family History  Problem Relation Age of Onset  . Diabetes Mellitus II Father   . Diabetes Brother   . Heart disease Neg Hx   . Stroke Neg Hx   . Kidney disease Neg Hx     Social History Social History  Substance Use Topics  . Smoking status: Current Every Day Smoker    Packs/day: 1.00    Years: 39.00    Types: Cigarettes  . Smokeless tobacco: Never Used     Comment: pt states that she is using the E cigs and is trying to quit  . Alcohol use No     Allergies   Patient has no known allergies.   Review of Systems Review of Systems  Constitutional: Positive for chills and fever.  Respiratory: Positive for cough, shortness of breath and wheezing.   Cardiovascular: Negative for chest pain, palpitations and leg swelling.  Gastrointestinal: Negative for abdominal pain, diarrhea, nausea and vomiting.  Genitourinary: Negative for  dysuria, flank pain and frequency.  Musculoskeletal: Positive for arthralgias. Negative for back pain, myalgias, neck pain and neck stiffness.  Skin: Negative for rash.  Neurological: Negative for dizziness, weakness, light-headedness, numbness and headaches.  Psychiatric/Behavioral: Positive for agitation.  All other systems reviewed and are negative.    Physical Exam Updated Vital Signs BP 131/83   Pulse (!) 105   Temp 98.7 F (37.1 C)   Resp (!) 22   Ht 5\' 7"  (1.702 m)   Wt 73.9 kg (163 lb)   SpO2 94%   BMI 25.53 kg/m   Physical Exam  Constitutional: She is oriented to person, place, and time. She appears well-developed and well-nourished. She appears distressed.  HENT:  Head: Normocephalic and atraumatic.  Mouth/Throat: Oropharynx is clear and moist. No oropharyngeal exudate.  Eyes: Pupils are equal,  round, and reactive to light. EOM are normal.  Neck: Normal range of motion. Neck supple.  No meningismus  Cardiovascular: Regular rhythm.   Tachycardia  Pulmonary/Chest: She is in respiratory distress. She has wheezes. She has rales.  External wheezing throughout with crackles in bilateral bases left greater than the right.  Abdominal: Soft. Bowel sounds are normal. There is no tenderness. There is no rebound and no guarding.  Musculoskeletal: Normal range of motion. She exhibits no edema or tenderness.  No lower extremity swelling or asymmetry. Patient does complain of some chronic left lower extremity pain. Distal pulses are 2+.  Neurological: She is alert and oriented to person, place, and time.  Moving all extremities without focal deficit. Sensation intact.  Skin: Skin is warm and dry. Capillary refill takes less than 2 seconds. No rash noted. No erythema.  Psychiatric: She has a normal mood and affect. Her behavior is normal.  Nursing note and vitals reviewed.    ED Treatments / Results  Labs (all labs ordered are listed, but only abnormal results are  displayed) Labs Reviewed  BASIC METABOLIC PANEL - Abnormal; Notable for the following:       Result Value   CO2 21 (*)    Glucose, Bld 319 (*)    All other components within normal limits  CBC - Abnormal; Notable for the following:    WBC 16.0 (*)    All other components within normal limits  ACETAMINOPHEN LEVEL - Abnormal; Notable for the following:    Acetaminophen (Tylenol), Serum <10 (*)    All other components within normal limits  RAPID URINE DRUG SCREEN, HOSP PERFORMED - Abnormal; Notable for the following:    Cocaine POSITIVE (*)    Tetrahydrocannabinol POSITIVE (*)    All other components within normal limits  TSH - Abnormal; Notable for the following:    TSH 0.178 (*)    All other components within normal limits  HEPATIC FUNCTION PANEL - Abnormal; Notable for the following:    Total Protein 8.2 (*)    Bilirubin, Direct <0.1 (*)    All other components within normal limits  GLUCOSE, CAPILLARY - Abnormal; Notable for the following:    Glucose-Capillary 376 (*)    All other components within normal limits  BLOOD GAS, ARTERIAL - Abnormal; Notable for the following:    pH, Arterial 7.271 (*)    pO2, Arterial 311 (*)    Acid-base deficit 5.2 (*)    All other components within normal limits  GLUCOSE, CAPILLARY - Abnormal; Notable for the following:    Glucose-Capillary 429 (*)    All other components within normal limits  GLUCOSE, CAPILLARY - Abnormal; Notable for the following:    Glucose-Capillary 448 (*)    All other components within normal limits  GLUCOSE, CAPILLARY - Abnormal; Notable for the following:    Glucose-Capillary 270 (*)    All other components within normal limits  GLUCOSE, CAPILLARY - Abnormal; Notable for the following:    Glucose-Capillary 283 (*)    All other components within normal limits  GLUCOSE, CAPILLARY - Abnormal; Notable for the following:    Glucose-Capillary 407 (*)    All other components within normal limits  BASIC METABOLIC PANEL -  Abnormal; Notable for the following:    Glucose, Bld 363 (*)    BUN 22 (*)    All other components within normal limits  GLUCOSE, CAPILLARY - Abnormal; Notable for the following:    Glucose-Capillary 326 (*)    All  other components within normal limits  GLUCOSE, CAPILLARY - Abnormal; Notable for the following:    Glucose-Capillary 252 (*)    All other components within normal limits  GLUCOSE, CAPILLARY - Abnormal; Notable for the following:    Glucose-Capillary 418 (*)    All other components within normal limits  GLUCOSE, CAPILLARY - Abnormal; Notable for the following:    Glucose-Capillary 329 (*)    All other components within normal limits  GLUCOSE, CAPILLARY - Abnormal; Notable for the following:    Glucose-Capillary 283 (*)    All other components within normal limits  GLUCOSE, CAPILLARY - Abnormal; Notable for the following:    Glucose-Capillary 322 (*)    All other components within normal limits  I-STAT ARTERIAL BLOOD GAS, ED - Abnormal; Notable for the following:    pH, Arterial 7.176 (*)    pCO2 arterial 58.7 (*)    Acid-base deficit 8.0 (*)    All other components within normal limits  I-STAT ARTERIAL BLOOD GAS, ED - Abnormal; Notable for the following:    pH, Arterial 7.231 (*)    pCO2 arterial 53.7 (*)    Acid-base deficit 5.0 (*)    All other components within normal limits  I-STAT ARTERIAL BLOOD GAS, ED - Abnormal; Notable for the following:    pH, Arterial 7.223 (*)    pCO2 arterial 50.4 (*)    pO2, Arterial 109.0 (*)    Acid-base deficit 7.0 (*)    All other components within normal limits  CULTURE, BLOOD (ROUTINE X 2)  CULTURE, BLOOD (ROUTINE X 2)  RESPIRATORY PANEL BY PCR  MRSA PCR SCREENING  CULTURE, EXPECTORATED SPUTUM-ASSESSMENT  GRAM STAIN  BRAIN NATRIURETIC PEPTIDE  SALICYLATE LEVEL  ETHANOL  T4, FREE  AMMONIA  HIV ANTIBODY (ROUTINE TESTING)  STREP PNEUMONIAE URINARY ANTIGEN  LEGIONELLA PNEUMOPHILA SEROGP 1 UR AG  LACTIC ACID, PLASMA   LACTIC ACID, PLASMA  PROCALCITONIN  PROCALCITONIN  I-STAT TROPONIN, ED  I-STAT CG4 LACTIC ACID, ED    EKG  EKG Interpretation  Date/Time:  Sunday May 17 2017 16:27:22 EDT Ventricular Rate:  123 PR Interval:  144 QRS Duration: 118 QT Interval:  312 QTC Calculation: 446 R Axis:   54 Text Interpretation:  Sinus tachycardia Right atrial enlargement Right bundle branch block Inferior infarct , age undetermined Abnormal ECG Confirmed by Lita Mains  MD, Jourden Gilson ([id number]) on 05/17/2017 5:16:36 PM       Radiology No results found.  Procedures Procedures (including critical care time)  Medications Ordered in ED Medications  iopamidol (ISOVUE-370) 76 % injection (not administered)  methylPREDNISolone sodium succinate (SOLU-MEDROL) 125 mg/2 mL injection 125 mg (125 mg Intravenous Given 05/17/17 1750)  levalbuterol (XOPENEX) nebulizer solution 1.25 mg (1.25 mg Nebulization Given 05/17/17 1903)  LORazepam (ATIVAN) injection 0.5 mg (0.5 mg Intravenous Given 05/17/17 1744)  LORazepam (ATIVAN) injection 0.5 mg (0.5 mg Intravenous Given 05/17/17 1907)  piperacillin-tazobactam (ZOSYN) IVPB 3.375 g (0 g Intravenous Stopped 05/17/17 2014)  vancomycin (VANCOCIN) IVPB 1000 mg/200 mL premix (0 mg Intravenous Stopped 05/17/17 2210)  sodium chloride 0.9 % bolus 1,000 mL (0 mLs Intravenous Stopped 05/17/17 2351)  LORazepam (ATIVAN) injection 1 mg (1 mg Intravenous Given 05/17/17 2212)  hydrALAZINE (APRESOLINE) injection 10 mg (10 mg Intravenous Given 05/18/17 0139)  sodium chloride 0.9 % bolus 1,000 mL (0 mLs Intravenous Stopped 05/18/17 0052)  cefTRIAXone (ROCEPHIN) 1 g in dextrose 5 % 50 mL IVPB (0 g Intravenous Stopped 05/18/17 0312)  levalbuterol (XOPENEX) 0.63 MG/3ML nebulizer solution (  Given  05/18/17 0000)  insulin aspart (novoLOG) injection 10 Units (10 Units Subcutaneous Given 05/18/17 0315)  LORazepam (ATIVAN) injection 0.5 mg ( Intravenous Given by Other 05/17/17 2215)  LORazepam (ATIVAN) 2 MG/ML  injection (  Duplicate 9/67/89 3810)  azithromycin (ZITHROMAX) tablet 500 mg (500 mg Oral Given 05/18/17 0410)  QUEtiapine (SEROQUEL) tablet 200 mg (200 mg Oral Given 05/18/17 0410)  insulin aspart (novoLOG) injection 10 Units (10 Units Subcutaneous Given 05/18/17 1607)  QUEtiapine (SEROQUEL) tablet 800 mg (800 mg Oral Given 05/18/17 2206)    CRITICAL CARE Performed by: Lita Mains, Anakaren Campion Total critical care time: 45 minutes Critical care time was exclusive of separately billable procedures and treating other patients. Critical care was necessary to treat or prevent imminent or life-threatening deterioration. Critical care was time spent personally by me on the following activities: development of treatment plan with patient and/or surrogate as well as nursing, discussions with consultants, evaluation of patient's response to treatment, examination of patient, obtaining history from patient or surrogate, ordering and performing treatments and interventions, ordering and review of laboratory studies, ordering and review of radiographic studies, pulse oximetry and re-evaluation of patient's condition. Initial Impression / Assessment and Plan / ED Course  I have reviewed the triage vital signs and the nursing notes.  Pertinent labs & imaging results that were available during my care of the patient were reviewed by me and considered in my medical decision making (see chart for details).    Patient with increased work of breathing. Placed on BiPAP. Given insect of symptoms was started antibiotics. There is some concern patients speculated to PE given acute nature of the onset of shortness of breath. Will attempt to get CT angiogram of the chest. Require multiple doses of benzodiazepines to control patient's agitation. Patient took off her BiPAP mask and became increasingly dyspneic and hypoxic. Placed back on BiPAP and repeat dose of Ativan. Discussed with hospitalist who will see patient in the  emergency department. Final Clinical Impressions(s) / ED Diagnoses   Final diagnoses:  Acute respiratory failure with hypercapnia ([place])    New Prescriptions Discharge Medication List as of 05/19/2017 12:48 PM    START taking these medications   Details  budesonide-formoterol (SYMBICORT) 160-4.5 MCG/ACT inhaler Inhale 2 puffs into the lungs 2 (two) times daily., Starting Tue 05/19/2017, Print    levofloxacin (LEVAQUIN) 500 MG tablet Take 1 tablet (500 mg total) by mouth daily., Starting Tue 05/19/2017, Until Tue 05/26/2017, Print    nicotine (NICODERM CQ - DOSED IN MG/24 HOURS) 21 mg/24hr patch Place 1 patch (21 mg total) onto the skin daily., Starting Tue 05/19/2017, Print    predniSONE (DELTASONE) 10 MG tablet Take 4 tablets for 3 days; Take 3 tablets for 4 days; Take 2 tablets for 3 days; Take 1 tablet for 4 days, Print         Julianne Rice, MD 05/22/17 [phone]

## 2017-05-17 NOTE — ED Triage Notes (Signed)
Pt states shortness of breath and palptations X2 days. Pt obviously dyspnic in triage. O2 saturation 88% on 6L via nasal cannula.

## 2017-05-18 ENCOUNTER — Inpatient Hospital Stay (HOSPITAL_COMMUNITY): Payer: BLUE CROSS/BLUE SHIELD

## 2017-05-18 DIAGNOSIS — J9601 Acute respiratory failure with hypoxia: Secondary | ICD-10-CM

## 2017-05-18 DIAGNOSIS — I10 Essential (primary) hypertension: Secondary | ICD-10-CM

## 2017-05-18 DIAGNOSIS — G9341 Metabolic encephalopathy: Secondary | ICD-10-CM | POA: Diagnosis present

## 2017-05-18 LAB — ECHOCARDIOGRAM COMPLETE
AO mean calculated velocity dopler: 163 cm/s
AOPV: 0.68 m/s
AV Area VTI: 1.94 cm2
AV Area mean vel: 1.86 cm2
AV Mean grad: 12 mmHg
AV Peak grad: 24 mmHg
AV VEL mean LVOT/AV: 0.66
AV area mean vel ind: 0.99 cm2/m2
AV peak Index: 1.03
AVA: 2.1 cm2
AVAREAVTIIND: 1.12 cm2/m2
AVPKVEL: 243 cm/s
Ao-asc: 31 cm
CHL CUP AV VEL: 2.1
CHL CUP TV REG PEAK VELOCITY: 219 cm/s
E decel time: 195 msec
E/e' ratio: 13.52
FS: 40 % (ref 28–44)
HEIGHTINCHES: 67 in
IVS/LV PW RATIO, ED: 1.08
LA ID, A-P, ES: 30 mm
LADIAMINDEX: 1.59 cm/m2
LAVOL: 43.3 mL
LAVOLA4C: 38.5 mL
LAVOLIN: 23 mL/m2
LDCA: 2.84 cm2
LEFT ATRIUM END SYS DIAM: 30 mm
LV E/e' medial: 13.52
LVEEAVG: 13.52
LVELAT: 8.95 cm/s
LVOT SV: 90 mL
LVOT VTI: 31.7 cm
LVOT diameter: 19 mm
LVOT peak grad rest: 11 mmHg
LVOT peak vel: 166 cm/s
LVOTVTI: 0.74 cm
MV Dec: 195
MVPG: 6 mmHg
MVPKAVEL: 137 m/s
MVPKEVEL: 121 m/s
PW: 11 mm — AB (ref 0.6–1.1)
RV LATERAL S' VELOCITY: 15.5 cm/s
TAPSE: 21.6 mm
TDI e' lateral: 8.95
TDI e' medial: 8.58
TR max vel: 219 cm/s
VTI: 42.8 cm
Valve area index: 1.12
WEIGHTICAEL: 2608 [oz_av]

## 2017-05-18 LAB — GLUCOSE, CAPILLARY
GLUCOSE-CAPILLARY: 270 mg/dL — AB (ref 65–99)
Glucose-Capillary: 376 mg/dL — ABNORMAL HIGH (ref 65–99)
Glucose-Capillary: 429 mg/dL — ABNORMAL HIGH (ref 65–99)
Glucose-Capillary: 448 mg/dL — ABNORMAL HIGH (ref 65–99)
Glucose-Capillary: 283 mg/dL — ABNORMAL HIGH (ref 65–99)
Glucose-Capillary: 407 mg/dL — ABNORMAL HIGH (ref 65–99)
Glucose-Capillary: 326 mg/dL — ABNORMAL HIGH (ref 65–99)
Glucose-Capillary: 252 mg/dL — ABNORMAL HIGH (ref 65–99)

## 2017-05-18 LAB — RESPIRATORY PANEL BY PCR
Adenovirus: NOT DETECTED
BORDETELLA PERTUSSIS-RVPCR: NOT DETECTED
CORONAVIRUS HKU1-RVPPCR: NOT DETECTED
CORONAVIRUS NL63-RVPPCR: NOT DETECTED
Chlamydophila pneumoniae: NOT DETECTED
Coronavirus 229E: NOT DETECTED
Coronavirus OC43: NOT DETECTED
INFLUENZA A H3-RVPPCR: NOT DETECTED
INFLUENZA B-RVPPCR: NOT DETECTED
Influenza A H1 2009: NOT DETECTED
Influenza A H1: NOT DETECTED
Influenza A: NOT DETECTED
METAPNEUMOVIRUS-RVPPCR: NOT DETECTED
Mycoplasma pneumoniae: NOT DETECTED
PARAINFLUENZA VIRUS 2-RVPPCR: NOT DETECTED
PARAINFLUENZA VIRUS 3-RVPPCR: NOT DETECTED
Parainfluenza Virus 1: NOT DETECTED
Parainfluenza Virus 4: NOT DETECTED
RHINOVIRUS / ENTEROVIRUS - RVPPCR: NOT DETECTED
Respiratory Syncytial Virus: NOT DETECTED

## 2017-05-18 LAB — BLOOD GAS, ARTERIAL
Acid-base deficit: 5.2 mmol/L — ABNORMAL HIGH (ref 0.0–2.0)
Bicarbonate: 20.4 mmol/L (ref 20.0–28.0)
DELIVERY SYSTEMS: POSITIVE
Drawn by: 39898
EXPIRATORY PAP: 8
FIO2: 80
INSPIRATORY PAP: 20
O2 Saturation: 99.7 %
PH ART: 7.271 — AB (ref 7.350–7.450)
Patient temperature: 99.3
pCO2 arterial: 46.1 mmHg (ref 32.0–48.0)
pO2, Arterial: 311 mmHg — ABNORMAL HIGH (ref 83.0–108.0)

## 2017-05-18 LAB — STREP PNEUMONIAE URINARY ANTIGEN: Strep Pneumo Urinary Antigen: NEGATIVE

## 2017-05-18 LAB — MRSA PCR SCREENING: MRSA by PCR: NEGATIVE

## 2017-05-18 LAB — LACTIC ACID, PLASMA
LACTIC ACID, VENOUS: 1.5 mmol/L (ref 0.5–1.9)
LACTIC ACID, VENOUS: 1.5 mmol/L (ref 0.5–1.9)

## 2017-05-18 LAB — PROCALCITONIN: Procalcitonin: <0.1 ng/mL

## 2017-05-18 LAB — HIV ANTIBODY (ROUTINE TESTING W REFLEX): HIV SCREEN 4TH GENERATION: NONREACTIVE

## 2017-05-18 MED ORDER — INSULIN DETEMIR 100 UNIT/ML ~~LOC~~ SOLN
30.0000 [IU] | Freq: Every day | SUBCUTANEOUS | Status: DC
  Administered 2017-05-18: 30 [IU] via SUBCUTANEOUS
  Filled 2017-05-18: qty 0.3

## 2017-05-18 MED ORDER — INSULIN ASPART 100 UNIT/ML ~~LOC~~ SOLN
0.0000 [IU] | Freq: Every day | SUBCUTANEOUS | Status: DC
  Administered 2017-05-18: 3 [IU] via SUBCUTANEOUS

## 2017-05-18 MED ORDER — INSULIN ASPART 100 UNIT/ML ~~LOC~~ SOLN
10.0000 [IU] | Freq: Once | SUBCUTANEOUS | Status: AC
  Administered 2017-05-18: 10 [IU] via SUBCUTANEOUS

## 2017-05-18 MED ORDER — IPRATROPIUM BROMIDE 0.02 % IN SOLN
0.5000 mg | RESPIRATORY_TRACT | Status: DC
  Administered 2017-05-18 (×5): 0.5 mg via RESPIRATORY_TRACT
  Filled 2017-05-18 (×6): qty 2.5

## 2017-05-18 MED ORDER — AZITHROMYCIN 250 MG PO TABS
250.0000 mg | ORAL_TABLET | Freq: Every day | ORAL | Status: DC
  Administered 2017-05-19: 250 mg via ORAL
  Filled 2017-05-18: qty 1

## 2017-05-18 MED ORDER — INSULIN REGULAR BOLUS VIA INFUSION
0.0000 [IU] | Freq: Three times a day (TID) | INTRAVENOUS | Status: DC
  Filled 2017-05-18: qty 10

## 2017-05-18 MED ORDER — INSULIN ASPART 100 UNIT/ML ~~LOC~~ SOLN
0.0000 [IU] | SUBCUTANEOUS | Status: DC
  Administered 2017-05-18 (×2): 5 [IU] via SUBCUTANEOUS

## 2017-05-18 MED ORDER — DEXTROSE 50 % IV SOLN: 25.0000 mL | INTRAVENOUS | Status: DC | PRN

## 2017-05-18 MED ORDER — SODIUM CHLORIDE 0.9 % IV SOLN
INTRAVENOUS | Status: DC
  Filled 2017-05-18: qty 1

## 2017-05-18 MED ORDER — QUETIAPINE FUMARATE 100 MG PO TABS
800.0000 mg | ORAL_TABLET | Freq: Once | ORAL | Status: AC
  Administered 2017-05-18: 800 mg via ORAL
  Filled 2017-05-18: qty 8

## 2017-05-18 MED ORDER — IPRATROPIUM BROMIDE 0.02 % IN SOLN
0.5000 mg | Freq: Four times a day (QID) | RESPIRATORY_TRACT | Status: DC
  Administered 2017-05-19 (×2): 0.5 mg via RESPIRATORY_TRACT
  Filled 2017-05-18 (×2): qty 2.5

## 2017-05-18 MED ORDER — LORAZEPAM 2 MG/ML IJ SOLN
0.5000 mg | Freq: Once | INTRAMUSCULAR | Status: AC
  Administered 2017-05-17: 22:00:00 via INTRAVENOUS

## 2017-05-18 MED ORDER — ARFORMOTEROL TARTRATE 15 MCG/2ML IN NEBU
15.0000 ug | INHALATION_SOLUTION | Freq: Two times a day (BID) | RESPIRATORY_TRACT | Status: DC
  Administered 2017-05-18 – 2017-05-19 (×4): 15 ug via RESPIRATORY_TRACT
  Filled 2017-05-18 (×4): qty 2

## 2017-05-18 MED ORDER — INSULIN DETEMIR 100 UNIT/ML ~~LOC~~ SOLN: 30.0000 [IU] | Freq: Every day | SUBCUTANEOUS | Status: DC

## 2017-05-18 MED ORDER — INSULIN ASPART 100 UNIT/ML ~~LOC~~ SOLN
2.0000 [IU] | SUBCUTANEOUS | Status: DC
  Administered 2017-05-18: 6 [IU] via SUBCUTANEOUS

## 2017-05-18 MED ORDER — DEXTROSE-NACL 5-0.45 % IV SOLN: INTRAVENOUS | Status: DC

## 2017-05-18 MED ORDER — ORAL CARE MOUTH RINSE
15.0000 mL | Freq: Two times a day (BID) | OROMUCOSAL | Status: DC
  Administered 2017-05-18 (×2): 15 mL via OROMUCOSAL

## 2017-05-18 MED ORDER — AZITHROMYCIN 500 MG PO TABS
500.0000 mg | ORAL_TABLET | Freq: Every day | ORAL | Status: AC
  Administered 2017-05-18: 500 mg via ORAL
  Filled 2017-05-18: qty 1

## 2017-05-18 MED ORDER — INSULIN ASPART 100 UNIT/ML ~~LOC~~ SOLN: 0.0000 [IU] | Freq: Three times a day (TID) | SUBCUTANEOUS | Status: DC

## 2017-05-18 MED ORDER — LEVALBUTEROL HCL 1.25 MG/0.5ML IN NEBU
1.2500 mg | INHALATION_SOLUTION | Freq: Four times a day (QID) | RESPIRATORY_TRACT | Status: DC
  Administered 2017-05-19 (×2): 1.25 mg via RESPIRATORY_TRACT
  Filled 2017-05-18 (×2): qty 0.5

## 2017-05-18 MED ORDER — INSULIN DETEMIR 100 UNIT/ML ~~LOC~~ SOLN
20.0000 [IU] | Freq: Every day | SUBCUTANEOUS | Status: DC
  Administered 2017-05-18: 20 [IU] via SUBCUTANEOUS
  Filled 2017-05-18: qty 0.2

## 2017-05-18 MED ORDER — PREDNISONE 20 MG PO TABS
40.0000 mg | ORAL_TABLET | Freq: Every day | ORAL | Status: DC
  Administered 2017-05-19: 40 mg via ORAL
  Filled 2017-05-18: qty 2

## 2017-05-18 MED ORDER — METHYLPREDNISOLONE SODIUM SUCC 125 MG IJ SOLR
60.0000 mg | Freq: Four times a day (QID) | INTRAMUSCULAR | Status: DC
  Administered 2017-05-18 (×2): 60 mg via INTRAVENOUS
  Filled 2017-05-18 (×2): qty 2

## 2017-05-18 MED ORDER — INSULIN ASPART 100 UNIT/ML ~~LOC~~ SOLN
0.0000 [IU] | Freq: Three times a day (TID) | SUBCUTANEOUS | Status: DC
  Administered 2017-05-18: 7 [IU] via SUBCUTANEOUS
  Administered 2017-05-19: 5 [IU] via SUBCUTANEOUS
  Administered 2017-05-19: 7 [IU] via SUBCUTANEOUS

## 2017-05-18 MED ORDER — SODIUM CHLORIDE 0.9 % IV SOLN: INTRAVENOUS | Status: DC

## 2017-05-18 MED ORDER — LORAZEPAM 2 MG/ML IJ SOLN
INTRAMUSCULAR | Status: AC
  Filled 2017-05-18: qty 1

## 2017-05-18 MED ORDER — LEVALBUTEROL HCL 1.25 MG/0.5ML IN NEBU
1.2500 mg | INHALATION_SOLUTION | RESPIRATORY_TRACT | Status: DC
  Administered 2017-05-18 (×5): 1.25 mg via RESPIRATORY_TRACT
  Filled 2017-05-18 (×5): qty 0.5

## 2017-05-18 MED ORDER — QUETIAPINE FUMARATE 100 MG PO TABS
200.0000 mg | ORAL_TABLET | Freq: Once | ORAL | Status: AC
  Administered 2017-05-18: 200 mg via ORAL
  Filled 2017-05-18: qty 2

## 2017-05-18 MED ORDER — BUDESONIDE 0.5 MG/2ML IN SUSP
0.5000 mg | Freq: Two times a day (BID) | RESPIRATORY_TRACT | Status: DC
  Administered 2017-05-18 – 2017-05-19 (×4): 0.5 mg via RESPIRATORY_TRACT
  Filled 2017-05-18 (×5): qty 2

## 2017-05-18 NOTE — Progress Notes (Signed)
Name: NADIRAH SOCORRO MRN: [id number] DOB: June 07, 1961    ADMISSION DATE:  05/17/2017 CONSULTATION DATE:  8/12   REFERRING MD:  Blaine Hamper, triad  CHIEF COMPLAINT:  Respiratory distress requiring BiPAP  HISTORY OF PRESENT ILLNESS:   56 year old smoker with polysubstance abuse arrived to the ED complaining of shortness of breath and palpitations for 2 days, found to be hypoxic in triage and placed on 6 L nasal cannula. Subsequent evaluation showed bilateral diffuse rhonchi, acute respiratory acidosis on ABG requiring BiPAP. Urine drug screen was positive for cocaine. Chest x-ray suggested left lower lobe infiltrate. She was noted to be tachycardic and hypertensive with initial blood pressure 192/118. Due to nonspecific agitation, was given a milligram of Ativan. She is currently lethargic and history is obtained after chart review and speaking to hospitalist. ABG shows persistent acute respiratory acidosis, hence PCCM consulted.  She was placed on bipap overnight.   CULTURES: Blood 8/12 Sputum culture 8/12  ANTIBIOTICS: Vancomycin and Zosyn (in ER on 8/12) Ceftriaxone 8/12 azithro 8/12 >>  SIGNIFICANT EVENTS: 8/12 >> adm, bipap  LINES/TUBES: 22G PIV in LUE   SUBJECTIVE:  Wants to eat.  Breathing feels much better.  Breathing appears comfortable on [place].   VITAL SIGNS: Temp:  [97.4 F (36.3 C)-99.3 F (37.4 C)] 97.5 F (36.4 C) (08/13 0714) Pulse Rate:  [93-131] 105 (08/13 0714) Resp:  [19-45] 21 (08/13 0714) BP: (109-192)/(73-136) 109/73 (08/13 0714) SpO2:  [88 %-100 %] 99 % (08/13 0714) FiO2 (%):  [50 %] 50 % (08/12 2356) Weight:  [73.9 kg (163 lb)] 73.9 kg (163 lb) (08/12 1633)  PHYSICAL EXAMINATION: General:  Chronically ill appearing female, NAD in bed  Neuro:  Awake, alert, appropriate, MAE HEENT:  Mm moist, no JVD Cardiovascular:  s1s2 rrr Lungs:  resps even non labored on [place], few scattered rhonchi, otherwise clear  Abdomen:  Round, soft, +bs  Musculoskeletal:   Warm and dry, no edema     Recent Labs Lab 05/17/17 1632  NA 137  K 3.7  CL 104  CO2 21*  BUN 8  CREATININE 0.80  GLUCOSE 319*    Recent Labs Lab 05/17/17 1632  HGB 12.7  HCT 37.4  WBC 16.0*  PLT 380   Dg Chest Port 1 View  Result Date: 05/17/2017 CLINICAL DATA:  Shortness of breath and palpitations for 2 days. EXAM: PORTABLE CHEST 1 VIEW COMPARISON:  November 13, 2014 FINDINGS: No pneumothorax. The heart, hila, and mediastinum are normal. Patchy opacity in the left base. No other pulmonary abnormalities. IMPRESSION: Patchy opacity in left base is concerning for early developing infiltrate. Recommend follow-up to resolution. No other acute abnormalities. Electronically Signed   By: Dorise Bullion III M.D   On: 05/17/2017 17:52    ASSESSMENT / PLAN:  Acute hypoxic and Acute hypercarbic respiratory failure - improved  COPD exacerbation - likely worsened by cocaine abuse  Left lower lobe community acquired pneumonia Plan -  No further bipap needs at this time  Continue IV solumedrol  Supplemental O2 as needed to keep sats >90%  Pulmonary hygiene  Continue abx as above  Duonebs, budesonide  F/u CXR  F/u cultures    Hypertensive urgency r/t Cocaine use - improved  PLAN -  Avoid beta blockers  PRN hydralazine  Trend troponin   Acute encephalopathy - resolved.  PLAN -  Avoid sedating medications  Monitor resp status  F/u ABG if mental status declines   PCCM signing off, please call back if needed.  Nickolas Madrid, NP 05/18/2017  11:30 AM Pager: [phone] or [phone]

## 2017-05-18 NOTE — Progress Notes (Signed)
ABG order was obtained but a venous sample was drawn, MD aware and notified. Per MD venous results are acceptable, no further sticks required. Results are as follows:  PH 7.38 CO2 37.9 PO2 33.6 HCO3 22.1 BE -2.2

## 2017-05-18 NOTE — Progress Notes (Signed)
PROGRESS NOTE  Ashley [name]  TGG:[id number] DOB: [birth year] DOA: 05/17/2017 PCP: Carlena Hurl, PA-C  Brief Narrative:   Ashley [name] is a 56 y.o. female with medical history significant of hypertension, hyperlipidemia, diabetes mellitus, stroke, GERD, depression, bipolar disorder, polysubstance abuse (tobacco, cocaine and marijuana), who presented with shortness breath.  Patient was very drowsy, barely arousable after given dose of Ativan in the ER due to agitation and was unable to provide medical history.  Per report, patient had progressively worsening shortness of breath for 2 days with productive cough.  Pt has oxygen desaturation to 88% on 6 L nasal cannula oxygen. BiPAP is started.  Initial pH was 7.176/PCO2 58.7 and improved on bipap.  UDS was positive for cocaine and THC.  Temperature 99.3, tachycardia, tachypnea, elevated blood pressure 192/118, chest chest showed possible left lower base infiltration. Patient is admitted to stepdown as inpatient, started on steroids, antibiotics.  Overnight, she was agitated and required several more doses of ativan and a dose of seroquel.  Despite these interventions, she was unable to tolerate bipap and it was removed.  She was not very responsive again this morning, but VBG was obtained which showed normalized pH and pCO2.    Assessment & Plan:   Principal Problem:   Acute respiratory failure with hypoxia (HCC) Active Problems:   Uncontrolled type 2 diabetes mellitus ([place])   Hyperlipidemia   Smoker   RBBB   History of stroke   Essential hypertension   Polysubstance abuse   CAP (community acquired pneumonia)   Sepsis ([place])   Acute metabolic encephalopathy  Acute respiratory failure with hypoxia due to possible lobar CAP and bronchospasm and sepsis: Patient has diffuse wheezing.  Chest x-ray showed possible left lower base infiltration. Pt is a smoker, she may have undiagnosed COPD exacerbation.   -  Transition to nasal canula with  goal O2 sat of 92% - continue IV Rocephin and azithromycin - Mucinex for cough  - prn Albuterol Nebs, atrovent Neb prn for SOB - Solu-Medrol, 60 mg 3 times a day - Urine legionella and S. pneumococcal antigen negative - Blood culture x2 pending - Respiratory virus panel pending - Procalcitonin negative  Uncontrolled type 2 diabetes mellitus with complications (stroke): Last A1c 9.0 on 07/02/16, poorly fairly well controled. Patient is taking Levemir and glipizide at home.  Hyperglycemic to 400s, but down to 200s now. -  Increase levemir to 20 units and continue q4h SSI  HLD:  stable -Continue home medications: Pravastatin  History of stroke:  Unclear why not on high dose statin given age -Continue aspirin and pravastatin  Polysubstance abuse: Including tobacco, cocaine and marijuana -Need counseling about importance of quitting smoking when mental status improves -Nicotine patch  Acute metabolic encephalopathy: Likely due to multifactorial etiology, including drug drug abuse and hypoxia, hypercapnea and now medication side effect. -treat underlying issues as above -Frequent neurologic checks -If not improving, may consider to get CT head.  Sick euthyroid/TSH suppressed by steroids -  Repeat TFTs in 4 weeks  DVT ppx: SQ Lovenox Code Status: Full code Family Communication: None at bed side. Disposition Plan:  Anticipate discharge back to previous home environment  Consultants:   PCCM  Procedures:  none  Antimicrobials:  Anti-infectives    Start     Dose/Rate Route Frequency Ordered Stop   05/19/17 1000  azithromycin (ZITHROMAX) tablet 250 mg     250 mg Oral Daily 05/18/17 0341 05/23/17 0959   05/18/17 2200  cefTRIAXone (ROCEPHIN)  1 g in dextrose 5 % 50 mL IVPB     1 g 100 mL/hr over 30 Minutes Intravenous Every 24 hours 05/17/17 2238 05/24/17 2159   05/18/17 0400  azithromycin (ZITHROMAX) tablet 500 mg     500 mg Oral Daily 05/18/17 0341 05/18/17 0410    05/18/17 0000  azithromycin (ZITHROMAX) 500 mg in dextrose 5 % 250 mL IVPB  Status:  Discontinued     500 mg 250 mL/hr over 60 Minutes Intravenous Every 24 hours 05/17/17 2238 05/18/17 0341   05/17/17 2245  cefTRIAXone (ROCEPHIN) 1 g in dextrose 5 % 50 mL IVPB     1 g 100 mL/hr over 30 Minutes Intravenous  Once 05/17/17 2240 05/18/17 0312   05/17/17 1845  piperacillin-tazobactam (ZOSYN) IVPB 3.375 g     3.375 g 100 mL/hr over 30 Minutes Intravenous  Once 05/17/17 1835 05/17/17 2014   05/17/17 1845  vancomycin (VANCOCIN) IVPB 1000 mg/200 mL premix     1,000 mg 200 mL/hr over 60 Minutes Intravenous  Once 05/17/17 1835 05/17/17 2210       Subjective:  Unable to keep eyes open for more than a second.  Denies pain.    Objective: Vitals:   05/18/17 0217 05/18/17 0218 05/18/17 0348 05/18/17 0714  BP:   (!) 149/108 109/73  Pulse: (!) 115  (!) 118 (!) 105  Resp: (!) 45  (!) 29 (!) 21  Temp:   98.5 F (36.9 C) (!) 97.5 F (36.4 C)  TempSrc:   Oral Axillary  SpO2: 97% 95% 99% 99%  Weight:      Height:        Intake/Output Summary (Last 24 hours) at 05/18/17 1119 Last data filed at 05/18/17 0600  Gross per 24 hour  Intake             1250 ml  Output                0 ml  Net             1250 ml   Filed Weights   05/17/17 1633  Weight: 73.9 kg (163 lb)    Examination:  General exam:  Adult female, with nonrebreather in place, SCM retractions, tachypneic to around 30 HEENT:  NCAT, MMM Respiratory system:  Diffuse wheezing throughout, scattered rales, no rhonchi Cardiovascular system: tachycardic, regular rhythm, normal S1/S2. No murmurs, rubs, gallops or clicks.  Warm extremities Gastrointestinal system: Normal active bowel sounds, soft, nondistended, nontender. MSK:  Normal tone and bulk, no lower extremity edema Neuro:  Grossly moves all extremities Psych:  Not alert and unable to assess orientation.      Data Reviewed: I have personally reviewed following labs and  imaging studies  CBC:  Recent Labs Lab 05/17/17 1632  WBC 16.0*  HGB 12.7  HCT 37.4  MCV 87.6  PLT 542   Basic Metabolic Panel:  Recent Labs Lab 05/17/17 1632  NA 137  K 3.7  CL 104  CO2 21*  GLUCOSE 319*  BUN 8  CREATININE 0.80  CALCIUM 9.9   GFR: Estimated Creatinine Clearance: 74.5 mL/min (by C-G formula based on SCr of 0.8 mg/dL). Liver Function Tests:  Recent Labs Lab 05/17/17 1632  AST 21  ALT 18  ALKPHOS 115  BILITOT 0.3  PROT 8.2*  ALBUMIN 4.0   No results for input(s): LIPASE, AMYLASE in the last 168 hours.  Recent Labs Lab 05/17/17 2057  AMMONIA 31   Coagulation Profile: No results for input(s):  INR, PROTIME in the last 168 hours. Cardiac Enzymes: No results for input(s): CKTOTAL, CKMB, CKMBINDEX, TROPONINI in the last 168 hours. BNP (last 3 results) No results for input(s): PROBNP in the last 8760 hours. HbA1C: No results for input(s): HGBA1C in the last 72 hours. CBG:  Recent Labs Lab 05/18/17 0118 05/18/17 0227 05/18/17 0345 05/18/17 0831  GLUCAP 376* 429* 448* 270*   Lipid Profile: No results for input(s): CHOL, HDL, LDLCALC, TRIG, CHOLHDL, LDLDIRECT in the last 72 hours. Thyroid Function Tests:  Recent Labs  05/17/17 2057  TSH 0.178*  FREET4 0.74   Anemia Panel: No results for input(s): VITAMINB12, FOLATE, FERRITIN, TIBC, IRON, RETICCTPCT in the last 72 hours. Urine analysis:    Component Value Date/Time   COLORURINE YELLOW 04/06/2016 1932   APPEARANCEUR CLEAR 04/06/2016 1932   LABSPEC 1.036 (H) 04/06/2016 1932   PHURINE 5.5 04/06/2016 1932   GLUCOSEU >1000 (A) 04/06/2016 1932   HGBUR NEGATIVE 04/06/2016 1932   BILIRUBINUR NEGATIVE 04/06/2016 1932   KETONESUR NEGATIVE 04/06/2016 1932   PROTEINUR NEGATIVE 04/06/2016 1932   UROBILINOGEN 0.2 11/13/2014 0942   NITRITE NEGATIVE 04/06/2016 1932   LEUKOCYTESUR NEGATIVE 04/06/2016 1932   Sepsis Labs: @LABRCNTIP (procalcitonin:4,lacticidven:4)  ) Recent Results  (from the past 240 hour(s))  MRSA PCR Screening     Status: None   Collection Time: 05/18/17 12:39 AM  Result Value Ref Range Status   MRSA by PCR NEGATIVE NEGATIVE Final    Comment:        The GeneXpert MRSA Assay (FDA approved for NASAL specimens only), is one component of a comprehensive MRSA colonization surveillance program. It is not intended to diagnose MRSA infection nor to guide or monitor treatment for MRSA infections.       Radiology Studies: Dg Chest Port 1 View  Result Date: 05/17/2017 CLINICAL DATA:  Shortness of breath and palpitations for 2 days. EXAM: PORTABLE CHEST 1 VIEW COMPARISON:  November 13, 2014 FINDINGS: No pneumothorax. The heart, hila, and mediastinum are normal. Patchy opacity in the left base. No other pulmonary abnormalities. IMPRESSION: Patchy opacity in left base is concerning for early developing infiltrate. Recommend follow-up to resolution. No other acute abnormalities. Electronically Signed   By: Dorise Bullion III M.D   On: 05/17/2017 17:52     Scheduled Meds: . amLODipine  10 mg Oral Daily  . arformoterol  15 mcg Nebulization BID  . aspirin EC  81 mg Oral Daily  . [START ON 05/19/2017] azithromycin  250 mg Oral Daily  . budesonide (PULMICORT) nebulizer solution  0.5 mg Nebulization BID  . dextromethorphan-guaiFENesin  1 tablet Oral BID  . enoxaparin (LOVENOX) injection  40 mg Subcutaneous Q24H  . gabapentin  200 mg Oral QHS  . insulin aspart  0-9 Units Subcutaneous Q4H  . insulin detemir  20 Units Subcutaneous Daily  . ipratropium  0.5 mg Nebulization Q4H  . levalbuterol  1.25 mg Nebulization Q4H  . mouth rinse  15 mL Mouth Rinse BID  . methylPREDNISolone (SOLU-MEDROL) injection  60 mg Intravenous Q6H  . nicotine  21 mg Transdermal Daily  . pravastatin  20 mg Oral q1800   Continuous Infusions: . cefTRIAXone (ROCEPHIN)  IV       LOS: 1 day    Time spent: 30 min    Janece Canterbury, MD [facility] Pager  [phone]  If 7PM-7AM, please contact night-coverage [email or web address] Password [email or web address] 05/18/2017, 11:19 AM

## 2017-05-18 NOTE — Progress Notes (Signed)
RT assessed patient. Patient is currently resting comfortably on 3L [place] with no respiratory distress noted. BIPAP is not needed at this time. BIPAP is at bedside. RT will monitor as needed.

## 2017-05-18 NOTE — Progress Notes (Signed)
  Echocardiogram 2D Echocardiogram has been performed.  Gilles Trimpe G Hadiyah Maricle 05/18/2017, 2:50 PM

## 2017-05-18 NOTE — Significant Event (Signed)
Rapid Response Event Note RN called for increase WOB  Overview: Time Called: 0226 Arrival Time: 0229 Event Type: Respiratory  Initial Focused Assessment: Pt sitting up in bed, skin warm and dry, some accessory muscle use, RR mid 30's, wheezing noted through out bilateral lungs, alert and oriented x4. Pt states she can not wear bipap as she feels like "she cant breathe." pt on NRB. Dr. Blaine Hamper to bedsde  Interventions: Gave Xopenex treatment.  Plan of Care (if not transferred): Continue to monitor and call when needed.  Event Summary: Name of Physician Notified: Dr. Elsworth Soho  at [place]    at    Outcome: Stayed in room and stabalized     [place], [place]

## 2017-05-18 NOTE — Progress Notes (Signed)
Pt transported to [facility] 13 without event.

## 2017-05-18 NOTE — Consult Note (Signed)
PULMONARY / CRITICAL CARE MEDICINE   Name: Ashley [name] MRN: [id number] DOB: [birth year]    ADMISSION DATE:  05/17/2017 CONSULTATION DATE:  05/18/2017   REFERRING MD:  Blaine Hamper, [facility]  CHIEF COMPLAINT:  Respiratory distress requiring BiPAP  HISTORY OF PRESENT ILLNESS:   56 year old smoker with polysubstance abuse arrived to the ED complaining of shortness of breath and palpitations for 2 days, found to be hypoxic in triage and placed on 6 L nasal cannula. Subsequent evaluation showed bilateral diffuse rhonchi, acute respiratory acidosis on ABG requiring BiPAP. Urine drug screen was positive for cocaine. Chest x-ray suggested left lower lobe infiltrate. She was noted to be tachycardic and hypertensive with initial blood pressure 192/118. Due to nonspecific agitation, was given a milligram of Ativan. She is currently lethargic and history is obtained after chart review and speaking to hospitalist. ABG shows persistent acute respiratory acidosis, hence [facility] consulted  At the time of my exam, (which is after she received ativan) patient is somnolent but awakens to sternal rub. Once awakened she admits to SOB but denies wheezing, cough, cp. She denies history of asthma or copd.   PAST MEDICAL HISTORY :  She  has a past medical history of Anxiety; Arthritis; Bipolar 1 disorder ([place]); Depression; Diabetes mellitus without complication ([place]); GERD (gastroesophageal reflux disease); Heart murmur; and HTN (hypertension).  PAST SURGICAL HISTORY: She  has a past surgical history that includes Abdominal hysterectomy and Lumbar laminectomy/decompression microdiscectomy (Left, 04/07/2016).  No Known Allergies  No current facility-administered medications on file prior to encounter.    Current Outpatient Prescriptions on File Prior to Encounter  Medication Sig  . amLODipine (NORVASC) 10 MG tablet Take 1 tablet (10 mg total) by mouth daily.  Marland Kitchen aspirin EC 81 MG tablet Take 1 tablet (81 mg total) by mouth  daily.  Marland Kitchen gabapentin (NEURONTIN) 100 MG capsule Take 2 capsules (200 mg total) by mouth at bedtime.  . Insulin Detemir (LEVEMIR FLEXPEN) 100 UNIT/ML Pen Inject 15 Units into the skin daily at 10 pm. (Patient taking differently: Inject 30 Units into the skin every morning. )  . pravastatin (PRAVACHOL) 20 MG tablet Take 1 tablet (20 mg total) by mouth daily.  . QUEtiapine (SEROQUEL) 400 MG tablet TAKE 2 TABLETS (800 MG TOTAL) BY MOUTH AT BEDTIME.  . Insulin Pen Needle (BD PEN NEEDLE NANO U/F) 32G X 4 MM MISC 1 each by Does not apply route daily.  . Vitamin D, Ergocalciferol, (DRISDOL) 50000 units CAPS capsule Take 1 capsule (50,000 Units total) by mouth every 7 (seven) days. (Patient not taking: Reported on 05/17/2017)   FAMILY HISTORY:  Her indicated that the status of her father is unknown. She indicated that the status of her brother is unknown. She indicated that the status of her neg hx is unknown.   SOCIAL HISTORY: She  reports that she has been smoking Cigarettes.  She has a 39.00 pack-year smoking history. She has never used smokeless tobacco. She reports that she uses drugs, including Cocaine. She reports that she does not drink alcohol.  REVIEW OF SYSTEMS:   Review of Systems  Unable to perform ROS: Mental status change   VITAL SIGNS: BP (!) 154/89   Pulse (!) 113   Temp 99.3 F (37.4 C) (Oral)   Resp (!) 35   Ht 5\' 7"  (1.702 m)   Wt 73.9 kg (163 lb)   SpO2 100%   BMI 25.53 kg/m   VENTILATOR SETTINGS: Vent Mode: PCV;BIPAP FiO2 (%):  [50 %]  50 % Set Rate:  [8 bmp-14 bmp] 14 bmp PEEP:  [5 cmH20-8 cmH20] 8 cmH20  INTAKE / OUTPUT: No intake/output data recorded.  PHYSICAL EXAMINATION: General: WDWN middle-aged female, unresponsive on BIPAP Neuro: very somnolent, initially did not waken to voice or touch but did awaken to sternal rub, after which time was able to answer questions and obey commands. Is moving all extremities HEENT: OP clear, MM moist, PERRL Cardiovascular:  Tachycardic at 105 with a regular rhythm Lungs: Wheezing and coarse breath sounds b/l, no accessory muscle use or respiratory distress on the BIPAP Abdomen: Soft NTND, BS+ Musculoskeletal: no LE edema Skin: no rashes  LABS:  BMET  Recent Labs Lab 05/17/17 1632  NA 137  K 3.7  CL 104  CO2 21*  BUN 8  CREATININE 0.80  GLUCOSE 319*   Electrolytes  Recent Labs Lab 05/17/17 1632  CALCIUM 9.9   CBC  Recent Labs Lab 05/17/17 1632  WBC 16.0*  HGB 12.7  HCT 37.4  PLT 380   Coag's No results for input(s): APTT, INR in the last 168 hours.  Sepsis Markers  Recent Labs Lab 05/17/17 1832 05/17/17 2318  LATICACIDVEN 1.39 1.5   ABG  Recent Labs Lab 05/17/17 1910 05/17/17 2043 05/17/17 2309  PHART 7.176* 7.231* 7.223*  PCO2ART 58.7* 53.7* 50.4*  PO2ART 90.0 104.0 109.0*   Liver Enzymes  Recent Labs Lab 05/17/17 1632  AST 21  ALT 18  ALKPHOS 115  BILITOT 0.3  ALBUMIN 4.0   Cardiac Enzymes No results for input(s): TROPONINI, PROBNP in the last 168 hours.  Glucose No results for input(s): GLUCAP in the last 168 hours.  Imaging Dg Chest Port 1 View  Result Date: 05/17/2017 CLINICAL DATA:  Shortness of breath and palpitations for 2 days. EXAM: PORTABLE CHEST 1 VIEW COMPARISON:  November 13, 2014 FINDINGS: No pneumothorax. The heart, hila, and mediastinum are normal. Patchy opacity in the left base. No other pulmonary abnormalities. IMPRESSION: Patchy opacity in left base is concerning for early developing infiltrate. Recommend follow-up to resolution. No other acute abnormalities. Electronically Signed   By: Dorise Bullion III M.D   On: 05/17/2017 17:52   CULTURES: Blood 8/12 Sputum culture 8/12  ANTIBIOTICS: Vancomycin and Zosyn (in ER on 8/12) Ceftriaxone 8/12 azithro 8/12 >>  SIGNIFICANT EVENTS: 8/12 >> adm, bipap  LINES/TUBES: 22G PIV in LUE  DISCUSSION: 56 year old smoker with polysubstance abuse, admit with SOB x 2 days, hypoxia and  hypercapnea, in setting of LLL CAP and Presumed COPD exacerbation, requiring BIPAP due to acute respiratory acidosis. Urine drug screen was positive for cocaine.   ASSESSMENT / PLAN:  PULMONARY 1. Acute hypoxic and Acute hypercarbic respiratory failure; COPD exacerbation; Left lower lobe community acquired pneumonia - continue BIPAP; currently was on 100% FIO2, IPAP 20, EPAP 8, with Vt 500, leak only 39, and Pox 100%. RR 22 at time of my exam - weaned FIO2 from 100% to 80%. Continue to wean as tolerated - recheck ABG now - given solumedrol 125mg  IV in the ER; continue 60mg  IV q6hrs - start duonebs q6hrs, pulmicort and brovana bid - if mental status worsens may need mechanical ventilation - likely the cocaine also contributed to this COPD exacerbation - NPO; GI and DVT prophylaxis  CARDIOVASCULAR 1. Hypertensive urgency; Cocaine use - was hypertensive to 192/118 on admission, SBP now improved to 155 but DBP 127 - avoid beta blockers - start hydralazine PRN - Point of care troponin negative; EKG showed no ST changes  RENAL No active issues  GASTROINTESTINAL No active issues  HEMATOLOGIC No active issues   INFECTIOUS 1. CAP - follow-up blood and sputum cultures - check lactate and procalcitonin - s/p 1 dose Vanc and Zosyn in ER; continue Ceftriaxone and azithromycin  ENDOCRINE 1. DM-2, insulin requiring -uncontrolled   - NPO; SSI q4hrs - Expect to rise with steroids; may need insulin gtt - is on Insulin detemir 15u daily; since is NPO now will give only 10 daily   NEUROLOGIC 1. Acute encephalopathy; Depression; Anxiety; Bipolar disorder; Cocaine abuse - seems she became much more somnolent after ativan given in the ER; avoid further ativan if possible - hold seroquel home dose until she is more awake; likely will not need it tonight.      60 minutes critical care time  Vernie Murders, MD Pulmonary and [place] Pager: [phone]  05/18/2017, 12:35 AM

## 2017-05-19 LAB — GLUCOSE, CAPILLARY
GLUCOSE-CAPILLARY: 322 mg/dL — AB (ref 65–99)
GLUCOSE-CAPILLARY: 329 mg/dL — AB (ref 65–99)
GLUCOSE-CAPILLARY: 418 mg/dL — AB (ref 65–99)
Glucose-Capillary: 283 mg/dL — ABNORMAL HIGH (ref 65–99)

## 2017-05-19 LAB — BASIC METABOLIC PANEL
Anion gap: 8 (ref 5–15)
BUN: 22 mg/dL — AB (ref 6–20)
CHLORIDE: 103 mmol/L (ref 101–111)
CO2: 24 mmol/L (ref 22–32)
CREATININE: 0.94 mg/dL (ref 0.44–1.00)
Calcium: 9.2 mg/dL (ref 8.9–10.3)
GFR calc Af Amer: >60 mL/min (ref >60)
Glucose, Bld: 363 mg/dL — ABNORMAL HIGH (ref 65–99)
POTASSIUM: 4.3 mmol/L (ref 3.5–5.1)
SODIUM: 135 mmol/L (ref 135–145)

## 2017-05-19 LAB — LEGIONELLA PNEUMOPHILA SEROGP 1 UR AG: L. pneumophila Serogp 1 Ur Ag: NEGATIVE

## 2017-05-19 MED ORDER — BUDESONIDE-FORMOTEROL FUMARATE 160-4.5 MCG/ACT IN AERO: 2.0000 | INHALATION_SPRAY | Freq: Two times a day (BID) | RESPIRATORY_TRACT | 12 refills | Status: DC

## 2017-05-19 MED ORDER — PREDNISONE 10 MG PO TABS: ORAL_TABLET | ORAL | 0 refills | Status: DC

## 2017-05-19 MED ORDER — LEVOFLOXACIN 500 MG PO TABS: 500.0000 mg | ORAL_TABLET | Freq: Every day | ORAL | 0 refills | Status: AC

## 2017-05-19 MED ORDER — NICOTINE 21 MG/24HR TD PT24: 21.0000 mg | MEDICATED_PATCH | Freq: Every day | TRANSDERMAL | 0 refills | Status: DC

## 2017-05-19 NOTE — Care Management Note (Signed)
Case Management Note  Patient Details  Name: Ashley [name] MRN: [id number] Date of Birth: Feb 18, 1961  Subjective/Objective:    Pt was admitted with SOB , positive for Cocaine  Action/Plan:   PTA independent from home with family.  Pt has PCP and denied barriers to obtaining medications as prescribed.  CSW consulted for current substance abuse.  NO CM needs determined prior to discharge   Expected Discharge Date:  05/19/17               Expected Discharge Plan:  Home/Self Care  In-House Referral:     Discharge planning Services  CM Consult  Post Acute Care Choice:    Choice offered to:     DME Arranged:    DME Agency:     HH Arranged:    [place] Agency:     Status of Service:  Completed, signed off  If discussed at [employer] of Stay Meetings, dates discussed:    Additional Comments:  Maryclare Labrador, RN 05/19/2017, 11:53 AM

## 2017-05-19 NOTE — Progress Notes (Signed)
Inpatient Diabetes Program Recommendations  AACE/ADA: New Consensus Statement on Inpatient Glycemic Control (2015)  Target Ranges:  Prepandial:   less than 140 mg/dL      Peak postprandial:   less than 180 mg/dL (1-2 hours)      Critically ill patients:  140 - 180 mg/dL   Results for Ashley [name], Ashley [name] (MRN [id number]) as of 05/19/2017 08:50  Ref. Range 05/18/2017 08:31 05/18/2017 11:39 05/18/2017 15:40 05/18/2017 17:35 05/18/2017 20:08 05/19/2017 00:04 05/19/2017 04:03 05/19/2017 08:10  Glucose-Capillary Latest Ref Range: 65 - 99 mg/dL 270 (H) 283 (H) 407 (H) 326 (H) 252 (H) 418 (H) 329 (H) 283 (H)   Review of Glycemic Control  Diabetes history: DM2 Outpatient Diabetes medications: Glipizide XL 10 mg QAM, Levemir 30 units QAM Current orders for Inpatient glycemic control: Levemir 30 units QHS, Novolog 0-9 units TID with meals, Novolog 0-5 units QHS  Inpatient Diabetes Program Recommendations:  Insulin - Basal: Patient received Levemir 20 units at 10:52 and Levemir 30 units at 22:09 on 05/18/17 and fasting glucose is 283 mg/dl this morning. Noted steroids changed to PO and decreased.  Please consider increasing Levemir to 35 units QHS. Correction (SSI): Please consider increasing Novolog correction to moderate scale (0-15 units). Insulin - Meal Coverage: Please consider ordering Novolog 4 units TID with meals for meal coverage if patient eats at least 50% of meals.  Thanks, Barnie Alderman, RN, MSN, CDE Diabetes Coordinator Inpatient Diabetes Program [phone] (Team Pager from 8am to 5pm)

## 2017-05-19 NOTE — Discharge Summary (Signed)
Physician Discharge Summary  APRILL BANKO  BLT:[id number]  DOB: 08-05-1961  DOA: 05/17/2017 PCP: Carlena Hurl, PA-C  Admit date: 05/17/2017 Discharge date: 05/19/2017  Admitted From: Home  Disposition:  Home   Recommendations for Outpatient Follow-up:  1. Follow up with PCP in 1 weeks 2. Please obtain BMP/CBC in one week to monitor Hgb and renal function  3. Please follow up on the following pending results: Blood cultures   Home Health: None  Equipment/Devices: None   Discharge Condition: Stable   CODE STATUS: FULL  Diet recommendation: Heart Healthy  Brief/Interim Summary: For full details see H&P in brief. KAYTLEN LIGHTSEY is a 56 year old female with medical history significant for hypertension, hyperlipidemia, diabetes mellitus, bipolar disorder, polysubstance abuse (alcohol, cocaine and marijuana) who presented to the ED with shortness of breath. Upon evaluation patient was found to be very drowsy with oxygen saturation on 88% on 6 L nasal cannula ABG was done which show pH of 7.17 therefore BiPAP was started. UDS was positive for cocaine and THC. Chest x-ray showed left lower base infiltrate. Patient was admitted to step down unit and started on IV antibiotics and steroids. Patient subsequently improved was weaned off of BiPAP and completely went off oxygen. Steroids were tapered oral medication. Pulmonary/critical care consulted which did not affect any further recommendations. The patient has clinically improved and feels her breathing is back to baseline. She was discharge with prednisone taper, oral antibiotics and inhalers.  Subjective: Patient seen and examined, no acute events overnight, per her breathing is back to baseline and she wants to go home. Patient remains afebrile. Denies shortness of breath, chest pain, palpitations and cough. Tolerating diet well and ambulating without issues.  Discharge Diagnoses/Hospital Course:  Acute respiratory failure with  hypercarbia and hypoxia Patient was treated with BiPAP subsequently oxygen supplementation was weaned off and she is breathing at room air and saturating over 95%. There was also an undiagnosed component of COPD. She was discharged on steroid inhalers and albuterol. She was advised to follow with PCP for proper diagnostic test for COPD  Sepsis 2/2 to [facility]  LLL CAP  Patient was treated with IV Rocephin and azithromycin Patient was discharged on Levaquin for completion purposes x 5 day  Urine Legionella and strep pneumococcal antigen were negative Respiratory panel negative Blood cultures were negative thus far Continue steroid taper and albuterol as needed  Acute metabolic encephalopathy due to hypercarbia as well as drug use - resolved Treating underlying causes  Hypertensive emergency due to cocaine use  Blood pressure normalized Continue home medications - amlodipine  Polysubstance abuse  Tobacco cessation and cocaine cessation discussed Nicotine patch prescribed Advice follow-up with PCP  Uncontrolled DM type 2  Patient was found to be hyperglycemic, questionable if was steroid although Levemir was increased to 20 unit during hospital stay. A1c was not checked. CBGs were more stable. Discharge. She was discharged on same home medication and was advised to follow up with PCP for further evaluation.   All other chronic medical condition were stable during the hospitalization.  On the day of the discharge the patient's vitals were stable, and no other acute medical condition were reported by patient. Patient was felt safe to be discharge to home  Discharge Instructions  You were cared for by a hospitalist during your hospital stay. If you have any questions about your discharge medications or the care you received while you were in the hospital after you are discharged, you can call the unit  and asked to speak with the hospitalist on call if the hospitalist that took care of you is not  available. Once you are discharged, your primary care physician will handle any further medical issues. Please note that NO REFILLS for any discharge medications will be authorized once you are discharged, as it is imperative that you return to your primary care physician (or establish a relationship with a primary care physician if you do not have one) for your aftercare needs so that they can reassess your need for medications and monitor your lab values.  Discharge Instructions    Call MD for:  difficulty breathing, headache or visual disturbances    Complete by:  As directed    Call MD for:  extreme fatigue    Complete by:  As directed    Call MD for:  hives    Complete by:  As directed    Call MD for:  persistant dizziness or light-headedness    Complete by:  As directed    Call MD for:  persistant nausea and vomiting    Complete by:  As directed    Call MD for:  redness, tenderness, or signs of infection (pain, swelling, redness, odor or green/yellow discharge around incision site)    Complete by:  As directed    Call MD for:  severe uncontrolled pain    Complete by:  As directed    Call MD for:  temperature >100.4    Complete by:  As directed    Diet - low sodium heart healthy    Complete by:  As directed    Diet general    Complete by:  As directed    Increase activity slowly    Complete by:  As directed      Allergies as of 05/19/2017   No Known Allergies     Medication List    TAKE these medications   amLODipine 10 MG tablet Commonly known as:  NORVASC Take 1 tablet (10 mg total) by mouth daily.   aspirin EC 81 MG tablet Take 1 tablet (81 mg total) by mouth daily.   budesonide-formoterol 160-4.5 MCG/ACT inhaler Commonly known as:  SYMBICORT Inhale 2 puffs into the lungs 2 (two) times daily.   gabapentin 100 MG capsule Commonly known as:  NEURONTIN Take 2 capsules (200 mg total) by mouth at bedtime.   GLIPIZIDE XL 10 MG 24 hr tablet Generic drug:   glipiZIDE Take 10 mg by mouth every morning.   Insulin Detemir 100 UNIT/ML Pen Commonly known as:  LEVEMIR FLEXPEN Inject 15 Units into the skin daily at 10 pm. What changed:  how much to take  when to take this   Insulin Pen Needle 32G X 4 MM Misc Commonly known as:  BD PEN NEEDLE NANO U/F 1 each by Does not apply route daily.   levofloxacin 500 MG tablet Commonly known as:  LEVAQUIN Take 1 tablet (500 mg total) by mouth daily.   nicotine 21 mg/24hr patch Commonly known as:  NICODERM CQ - dosed in mg/24 hours Place 1 patch (21 mg total) onto the skin daily.   oxyCODONE-acetaminophen 7.5-325 MG tablet Commonly known as:  PERCOCET Take 1 tablet by mouth 3 (three) times daily as needed for pain.   pravastatin 20 MG tablet Commonly known as:  PRAVACHOL Take 1 tablet (20 mg total) by mouth daily.   predniSONE 10 MG tablet Commonly known as:  DELTASONE Take 4 tablets for 3 days; Take 3 tablets for  4 days; Take 2 tablets for 3 days; Take 1 tablet for 4 days   PROAIR HFA 108 (90 Base) MCG/ACT inhaler Generic drug:  albuterol Inhale 1-2 puffs into the lungs every 6 (six) hours as needed for wheezing or cough.   QUEtiapine 400 MG tablet Commonly known as:  SEROQUEL TAKE 2 TABLETS (800 MG TOTAL) BY MOUTH AT BEDTIME.   Vitamin D (Ergocalciferol) 50000 units Caps capsule Commonly known as:  DRISDOL Take 1 capsule (50,000 Units total) by mouth every 7 (seven) days.      Follow-up Information    Tysinger, Camelia Eng, PA-C. Schedule an appointment as soon as possible for a visit in 1 week(s).   Specialty:  Family Medicine Why:  Hospital follow up  Contact information: [address] [place] [address] [phone]          No Known Allergies  Consultations:  Pulmonary/critical care   Procedures/Studies: Mr Hip Left Wo Contrast  Result Date: 05/10/2017 CLINICAL DATA:  Severe left hip pain since a fall 1 year ago. EXAM: MR OF THE LEFT HIP WITHOUT CONTRAST  TECHNIQUE: Multiplanar, multisequence MR imaging was performed. No intravenous contrast was administered. COMPARISON:  Plain films left hip 10/14/2016. MRI lumbar spine 07/05/2016. FINDINGS: Bones: Severe degenerative endplate signal change is seen at L4-5, worse than on the prior MRI. There is also degenerative endplate signal change at L5-S1. Marrow signal in the hips and throughout the pelvis is normal. No fracture, stress change or focal lesion. No avascular necrosis of the femoral heads. Articular cartilage and labrum Articular cartilage:  Intact. Labrum: The anterior, superior labrum is mildly frayed but no tear is identified. Joint or bursal effusion Joint effusion:  None. Bursae:  Negative. Muscles and tendons Muscles and tendons:  Intact and normal in appearance. Other findings Miscellaneous: Mild sigmoid diverticulosis is seen. The patient is status post hysterectomy. IMPRESSION: Degenerative endplate signal change Y8-6 and L5-S1 appears intense at L4-5. Mild fraying of the left anterior, superior acetabular labrum without tear identified. The left hip otherwise appears normal. Electronically Signed   By: Inge Rise M.D.   On: 05/10/2017 12:13   Dg Chest Port 1 View  Result Date: 05/17/2017 CLINICAL DATA:  Shortness of breath and palpitations for 2 days. EXAM: PORTABLE CHEST 1 VIEW COMPARISON:  November 13, 2014 FINDINGS: No pneumothorax. The heart, hila, and mediastinum are normal. Patchy opacity in the left base. No other pulmonary abnormalities. IMPRESSION: Patchy opacity in left base is concerning for early developing infiltrate. Recommend follow-up to resolution. No other acute abnormalities. Electronically Signed   By: Dorise Bullion III M.D   On: 05/17/2017 17:52   Echocardiogram 05/18/2017 ------------------------------------------------------------------- Study Conclusions  - Left ventricle: The cavity size was normal. Wall thickness was   normal. Systolic function was vigorous.  The estimated ejection   fraction was in the range of 65% to 70%. Wall motion was normal;   there were no regional wall motion abnormalities. Doppler   parameters are consistent with abnormal left ventricular   relaxation (grade 1 diastolic dysfunction).  Impressions:  - Hyperdynamic LV systolic function; mild diastolic dysfunction;   elevated LVOT mean gradient of 13 mmHg likely related to   hyperdynamic LV function.  Discharge Exam: Vitals:   05/19/17 0814 05/19/17 1133  BP: 119/72 131/81  Pulse: 98 98  Resp: (!) 24 19  Temp: 98.7 F (37.1 C) 98.7 F (37.1 C)  SpO2: 94%    Vitals:   05/19/17 0007 05/19/17 0717 05/19/17 0814 05/19/17  1133  BP: 113/64  119/72 131/81  Pulse: (!) 115 94 98 98  Resp: 16 20 (!) 24 19  Temp: 98.4 F (36.9 C)  98.7 F (37.1 C) 98.7 F (37.1 C)  TempSrc: Oral     SpO2: 94% 93% 94%   Weight:      Height:        General: Pt is alert, awake, not in acute distress Cardiovascular: RRR, S1/S2 +, no rubs, no gallops Respiratory: Good air entry, mild bilateral late expiratory wheezes at the bases Abdominal: Soft, NT, ND, bowel sounds + Extremities: no edema, no cyanosis  The results of significant diagnostics from this hospitalization (including imaging, microbiology, ancillary and laboratory) are listed below for reference.     Microbiology: Recent Results (from the past 240 hour(s))  Respiratory Panel by PCR     Status: None   Collection Time: 05/17/17  1:14 AM  Result Value Ref Range Status   Adenovirus NOT DETECTED NOT DETECTED Final    Comment: NOT DETECTED   Coronavirus 229E NOT DETECTED NOT DETECTED Final   Coronavirus HKU1 NOT DETECTED NOT DETECTED Final   Coronavirus NL63 NOT DETECTED NOT DETECTED Final   Coronavirus OC43 NOT DETECTED NOT DETECTED Final   Metapneumovirus NOT DETECTED NOT DETECTED Final   Rhinovirus / Enterovirus NOT DETECTED NOT DETECTED Final   Influenza A NOT DETECTED NOT DETECTED Final   Influenza A H1 NOT  DETECTED NOT DETECTED Final   Influenza A H1 2009 NOT DETECTED NOT DETECTED Final   Influenza A H3 NOT DETECTED NOT DETECTED Final   Influenza B NOT DETECTED NOT DETECTED Final   Parainfluenza Virus 1 NOT DETECTED NOT DETECTED Final   Parainfluenza Virus 2 NOT DETECTED NOT DETECTED Final   Parainfluenza Virus 3 NOT DETECTED NOT DETECTED Final   Parainfluenza Virus 4 NOT DETECTED NOT DETECTED Final   Respiratory Syncytial Virus NOT DETECTED NOT DETECTED Final   Bordetella pertussis NOT DETECTED NOT DETECTED Final   Chlamydophila pneumoniae NOT DETECTED NOT DETECTED Final   Mycoplasma pneumoniae NOT DETECTED NOT DETECTED Final  Culture, blood (Routine X 2) w Reflex to ID Panel     Status: None (Preliminary result)   Collection Time: 05/17/17  6:04 PM  Result Value Ref Range Status   Specimen Description BLOOD LEFT ANTECUBITAL  Final   Special Requests   Final    BOTTLES DRAWN AEROBIC AND ANAEROBIC Blood Culture adequate volume   Culture NO GROWTH < 24 HOURS  Final   Report Status PENDING  Incomplete  Culture, blood (Routine X 2) w Reflex to ID Panel     Status: None (Preliminary result)   Collection Time: 05/17/17  7:14 PM  Result Value Ref Range Status   Specimen Description BLOOD RIGHT HAND  Final   Special Requests   Final    BOTTLES DRAWN AEROBIC AND ANAEROBIC Blood Culture adequate volume   Culture NO GROWTH < 24 HOURS  Final   Report Status PENDING  Incomplete  MRSA PCR Screening     Status: None   Collection Time: 05/18/17 12:39 AM  Result Value Ref Range Status   MRSA by PCR NEGATIVE NEGATIVE Final    Comment:        The GeneXpert MRSA Assay (FDA approved for NASAL specimens only), is one component of a comprehensive MRSA colonization surveillance program. It is not intended to diagnose MRSA infection nor to guide or monitor treatment for MRSA infections.      Labs: BNP (last 3  results)  Recent Labs  05/17/17 1714  BNP 41.7   Basic Metabolic Panel:  Recent  Labs Lab 05/17/17 1632 05/19/17 0420  NA 137 135  K 3.7 4.3  CL 104 103  CO2 21* 24  GLUCOSE 319* 363*  BUN 8 22*  CREATININE 0.80 0.94  CALCIUM 9.9 9.2   Liver Function Tests:  Recent Labs Lab 05/17/17 1632  AST 21  ALT 18  ALKPHOS 115  BILITOT 0.3  PROT 8.2*  ALBUMIN 4.0   No results for input(s): LIPASE, AMYLASE in the last 168 hours.  Recent Labs Lab 05/17/17 2057  AMMONIA 31   CBC:  Recent Labs Lab 05/17/17 1632  WBC 16.0*  HGB 12.7  HCT 37.4  MCV 87.6  PLT 380   Cardiac Enzymes: No results for input(s): CKTOTAL, CKMB, CKMBINDEX, TROPONINI in the last 168 hours. BNP: Invalid input(s): POCBNP CBG:  Recent Labs Lab 05/18/17 2008 05/19/17 0004 05/19/17 0403 05/19/17 0810 05/19/17 1128  GLUCAP 252* 418* 329* 283* 322*   D-Dimer No results for input(s): DDIMER in the last 72 hours. Hgb A1c No results for input(s): HGBA1C in the last 72 hours. Lipid Profile No results for input(s): CHOL, HDL, LDLCALC, TRIG, CHOLHDL, LDLDIRECT in the last 72 hours. Thyroid function studies  Recent Labs  05/17/17 2057  TSH 0.178*   Anemia work up No results for input(s): VITAMINB12, FOLATE, FERRITIN, TIBC, IRON, RETICCTPCT in the last 72 hours. Urinalysis    Component Value Date/Time   COLORURINE YELLOW 04/06/2016 1932   APPEARANCEUR CLEAR 04/06/2016 1932   LABSPEC 1.036 (H) 04/06/2016 1932   PHURINE 5.5 04/06/2016 1932   GLUCOSEU >1000 (A) 04/06/2016 1932   HGBUR NEGATIVE 04/06/2016 1932   BILIRUBINUR NEGATIVE 04/06/2016 1932   KETONESUR NEGATIVE 04/06/2016 1932   PROTEINUR NEGATIVE 04/06/2016 1932   UROBILINOGEN 0.2 11/13/2014 0942   NITRITE NEGATIVE 04/06/2016 1932   LEUKOCYTESUR NEGATIVE 04/06/2016 1932   Sepsis Labs Invalid input(s): PROCALCITONIN,  WBC,  LACTICIDVEN Microbiology Recent Results (from the past 240 hour(s))  Respiratory Panel by PCR     Status: None   Collection Time: 05/17/17  1:14 AM  Result Value Ref Range Status    Adenovirus NOT DETECTED NOT DETECTED Final    Comment: NOT DETECTED   Coronavirus 229E NOT DETECTED NOT DETECTED Final   Coronavirus HKU1 NOT DETECTED NOT DETECTED Final   Coronavirus NL63 NOT DETECTED NOT DETECTED Final   Coronavirus OC43 NOT DETECTED NOT DETECTED Final   Metapneumovirus NOT DETECTED NOT DETECTED Final   Rhinovirus / Enterovirus NOT DETECTED NOT DETECTED Final   Influenza A NOT DETECTED NOT DETECTED Final   Influenza A H1 NOT DETECTED NOT DETECTED Final   Influenza A H1 2009 NOT DETECTED NOT DETECTED Final   Influenza A H3 NOT DETECTED NOT DETECTED Final   Influenza B NOT DETECTED NOT DETECTED Final   Parainfluenza Virus 1 NOT DETECTED NOT DETECTED Final   Parainfluenza Virus 2 NOT DETECTED NOT DETECTED Final   Parainfluenza Virus 3 NOT DETECTED NOT DETECTED Final   Parainfluenza Virus 4 NOT DETECTED NOT DETECTED Final   Respiratory Syncytial Virus NOT DETECTED NOT DETECTED Final   Bordetella pertussis NOT DETECTED NOT DETECTED Final   Chlamydophila pneumoniae NOT DETECTED NOT DETECTED Final   Mycoplasma pneumoniae NOT DETECTED NOT DETECTED Final  Culture, blood (Routine X 2) w Reflex to ID Panel     Status: None (Preliminary result)   Collection Time: 05/17/17  6:04 PM  Result Value Ref Range  Status   Specimen Description BLOOD LEFT ANTECUBITAL  Final   Special Requests   Final    BOTTLES DRAWN AEROBIC AND ANAEROBIC Blood Culture adequate volume   Culture NO GROWTH < 24 HOURS  Final   Report Status PENDING  Incomplete  Culture, blood (Routine X 2) w Reflex to ID Panel     Status: None (Preliminary result)   Collection Time: 05/17/17  7:14 PM  Result Value Ref Range Status   Specimen Description BLOOD RIGHT HAND  Final   Special Requests   Final    BOTTLES DRAWN AEROBIC AND ANAEROBIC Blood Culture adequate volume   Culture NO GROWTH < 24 HOURS  Final   Report Status PENDING  Incomplete  MRSA PCR Screening     Status: None   Collection Time: 05/18/17 12:39 AM   Result Value Ref Range Status   MRSA by PCR NEGATIVE NEGATIVE Final    Comment:        The GeneXpert MRSA Assay (FDA approved for NASAL specimens only), is one component of a comprehensive MRSA colonization surveillance program. It is not intended to diagnose MRSA infection nor to guide or monitor treatment for MRSA infections.      Time coordinating discharge: 35 minutes  SIGNED:  Chipper Oman, MD  [facility] 05/19/2017, 11:36 AM  Pager please text page via  [email or web address] Password [email or web address]

## 2017-05-20 ENCOUNTER — Telehealth: Payer: Self-pay

## 2017-05-20 NOTE — Telephone Encounter (Signed)
Called pt in regards to hospital f/u admission dates 05/17/2017-05/19/2017. Pt advised me that she is no longer a patient her at [facility] with Dorothe[facility] Ogle. Her new PCP is Raelyn Number on [facility]. Audelia Acton removed as PCP on pt's chart. Victorino December

## 2017-05-21 ENCOUNTER — Emergency Department (HOSPITAL_COMMUNITY)
Admission: EM | Admit: 2017-05-21 | Discharge: 2017-05-21 | Disposition: A | Discharge status: Home / Self Care | Payer: BLUE CROSS/BLUE SHIELD | Attending: Emergency Medicine | Admitting: Emergency Medicine

## 2017-05-21 ENCOUNTER — Encounter (HOSPITAL_COMMUNITY): Payer: Self-pay | Admitting: Emergency Medicine

## 2017-05-21 DIAGNOSIS — R238 Other skin changes: Secondary | ICD-10-CM | POA: Insufficient documentation

## 2017-05-21 DIAGNOSIS — Z794 Long term (current) use of insulin: Secondary | ICD-10-CM | POA: Insufficient documentation

## 2017-05-21 DIAGNOSIS — E1165 Type 2 diabetes mellitus with hyperglycemia: Secondary | ICD-10-CM

## 2017-05-21 DIAGNOSIS — Z79899 Other long term (current) drug therapy: Secondary | ICD-10-CM | POA: Diagnosis not present

## 2017-05-21 DIAGNOSIS — Z7982 Long term (current) use of aspirin: Secondary | ICD-10-CM | POA: Insufficient documentation

## 2017-05-21 DIAGNOSIS — F17210 Nicotine dependence, cigarettes, uncomplicated: Secondary | ICD-10-CM | POA: Diagnosis not present

## 2017-05-21 DIAGNOSIS — I10 Essential (primary) hypertension: Secondary | ICD-10-CM | POA: Diagnosis not present

## 2017-05-21 DIAGNOSIS — R239 Unspecified skin changes: Secondary | ICD-10-CM | POA: Diagnosis present

## 2017-05-21 DIAGNOSIS — T148XXA Other injury of unspecified body region, initial encounter: Secondary | ICD-10-CM

## 2017-05-21 LAB — PROTIME-INR
INR: 0.97
PROTHROMBIN TIME: 12.9 s (ref 11.4–15.2)

## 2017-05-21 LAB — CBC WITH DIFFERENTIAL/PLATELET
BASOS ABS: 0 10*3/uL (ref 0.0–0.1)
Basophils Relative: 0 %
EOS ABS: 0.5 10*3/uL (ref 0.0–0.7)
Eosinophils Relative: 4 %
HCT: 37.8 % (ref 36.0–46.0)
HEMOGLOBIN: 12.6 g/dL (ref 12.0–15.0)
LYMPHS ABS: 2 10*3/uL (ref 0.7–4.0)
LYMPHS PCT: 17 %
MCH: 29.1 pg (ref 26.0–34.0)
MCHC: 33.3 g/dL (ref 30.0–36.0)
MCV: 87.3 fL (ref 78.0–100.0)
Monocytes Absolute: 0.3 10*3/uL (ref 0.1–1.0)
Monocytes Relative: 2 %
NEUTROS PCT: 77 %
Neutro Abs: 8.9 10*3/uL — ABNORMAL HIGH (ref 1.7–7.7)
Platelets: 440 10*3/uL — ABNORMAL HIGH (ref 150–400)
RBC: 4.33 MIL/uL (ref 3.87–5.11)
RDW: 14.4 % (ref 11.5–15.5)
WBC: 11.7 10*3/uL — AB (ref 4.0–10.5)

## 2017-05-21 LAB — BASIC METABOLIC PANEL
Anion gap: 12 (ref 5–15)
BUN: 18 mg/dL (ref 6–20)
CHLORIDE: 100 mmol/L — AB (ref 101–111)
CO2: 23 mmol/L (ref 22–32)
CREATININE: 1.01 mg/dL — AB (ref 0.44–1.00)
Calcium: 10 mg/dL (ref 8.9–10.3)
GFR calc non Af Amer: >60 mL/min (ref >60)
Glucose, Bld: 497 mg/dL — ABNORMAL HIGH (ref 65–99)
POTASSIUM: 4.7 mmol/L (ref 3.5–5.1)
SODIUM: 135 mmol/L (ref 135–145)

## 2017-05-21 LAB — CBG MONITORING, ED
GLUCOSE-CAPILLARY: 512 mg/dL — AB (ref 65–99)
GLUCOSE-CAPILLARY: 373 mg/dL — AB (ref 65–99)

## 2017-05-21 MED ORDER — INSULIN ASPART 100 UNIT/ML ~~LOC~~ SOLN
10.0000 [IU] | Freq: Once | SUBCUTANEOUS | Status: AC
  Administered 2017-05-21: 10 [IU] via INTRAVENOUS
  Filled 2017-05-21: qty 1

## 2017-05-21 MED ORDER — SODIUM CHLORIDE 0.9 % IV BOLUS (SEPSIS)
1000.0000 mL | Freq: Once | INTRAVENOUS | Status: AC
  Administered 2017-05-21: 1000 mL via INTRAVENOUS

## 2017-05-21 NOTE — ED Notes (Signed)
ED Provider at bedside. 

## 2017-05-21 NOTE — ED Provider Notes (Signed)
[facility] DEPT Provider Note   CSN: [id number] Arrival date & time: 05/21/17  1438     History   Chief Complaint Chief Complaint  Patient presents with  . Bleeding/Bruising    HPI Ashley [name] is a 55 y.o. female.  Ashley [name] is a 56 y.o. Female who presents to the emergency department complaining of bruising to her abdomen after she received injections to her abdomen while being admitted a few days ago. Patient reports she was receiving injections in her abdomen. Based on medical records and [facility] she was receiving Lovenox injections for about a day while she was admitted to the hospital. These were later discontinued. She complains of some bruising to her abdomen and denies any pain to her abdomen. She denies any hematemesis or hematochezia. No gum bleeding. She also tells me she did not take her Levemir injection today prior to arrival. She was distracted about the abdominal bruising and just forgot. She reports she has plenty of insulin at home. She denies other bruising or bleeding. She reports overall she feels well and has no other complaints. She is not currently on anticoagulants. She denies fevers, abdominal pain, nausea, vomiting, diarrhea, hematemesis, hematochezia, hematuria, urinary symptoms, chest pain, shortness of breath, rashes or gum bleeding.    The history is provided by the patient and medical records. No language interpreter was used.    Past Medical History:  Diagnosis Date  . Anxiety   . Arthritis   . Bipolar 1 disorder ([place])   . Depression   . Diabetes mellitus without complication ([place])    Type II  . GERD (gastroesophageal reflux disease)   . Heart murmur    "nothing to worry about"  . HTN (hypertension)    not on medication    Patient Active Problem List   Diagnosis Date Noted  . Acute metabolic encephalopathy 78/93/8101  . Polysubstance abuse 05/17/2017  . CAP (community acquired pneumonia) 05/17/2017  . Sepsis ([place]) 05/17/2017  .  Acute respiratory failure with hypoxia ([place]) 05/17/2017  . Left buttock pain 10/15/2016  . Acute pain 10/15/2016  . Acute non-recurrent sinusitis 10/15/2016  . Chronic bilateral low back pain without sciatica 10/15/2016  . Fall 10/15/2016  . Heart murmur 08/04/2016  . Essential hypertension 08/04/2016  . Mixed dyslipidemia 08/04/2016  . History of stroke 07/02/2016  . Renal insufficiency 07/02/2016  . Bipolar affective disorder in remission ([place]) 07/02/2016  . Vaccine refused by patient 07/02/2016  . Noncompliance 07/02/2016  . Herniated lumbar intervertebral disc 04/07/2016  . 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 Cerebral infarction ([place]) 05/20/2013  . Marijuana smoker 05/20/2013  . Smoker 05/20/2013  . Syncope and collapse 05/20/2013  . Acute kidney injury ([place]) 05/20/2013  . Dehydration 05/20/2013  . Hot flashes, menopausal 05/20/2013  . RBBB 05/20/2013  . Normocytic anemia 05/20/2013  . Thrombocytosis ([place]) 05/20/2013  . Depression 05/02/2013  . Colitis presumed infectious 05/01/2013  . Uncontrolled type 2 diabetes mellitus ([place]) 05/01/2013  . Hyperlipidemia 05/01/2013    Past Surgical History:  Procedure Laterality Date  . ABDOMINAL HYSTERECTOMY    . LUMBAR LAMINECTOMY/DECOMPRESSION MICRODISCECTOMY Left 04/07/2016   Procedure: Left Lumbar four-five Microdiskectomy;  Surgeon: Erline Levine, MD;  Location: [facility];  Service: Neurosurgery;  Laterality: Left;    OB History    Gravida Para Term Preterm AB Living   4 3 2   1 4    SAB TAB Ectopic Multiple Live Births  1 4       Home Medications    Prior to Admission medications   Medication Sig Start Date End Date Taking? Authorizing Provider  amLODipine (NORVASC) 10 MG tablet Take 1 tablet (10 mg total) by mouth daily. 10/17/16   Tysinger, Camelia Eng, PA-C  aspirin EC 81 MG tablet Take 1 tablet (81 mg total) by mouth daily. 07/03/16   Tysinger, Camelia Eng, PA-C  budesonide-formoterol (SYMBICORT) 160-4.5  MCG/ACT inhaler Inhale 2 puffs into the lungs 2 (two) times daily. 05/19/17   Doreatha Lew, MD  gabapentin (NEURONTIN) 100 MG capsule Take 2 capsules (200 mg total) by mouth at bedtime. 11/05/16   Lyndal Pulley, DO  GLIPIZIDE XL 10 MG 24 hr tablet Take 10 mg by mouth every morning. 04/16/17   [provider]  Insulin Detemir (LEVEMIR FLEXPEN) 100 UNIT/ML Pen Inject 15 Units into the skin daily at 10 pm. Patient taking differently: Inject 30 Units into the skin every morning.  10/17/16   Tysinger, Camelia Eng, PA-C  Insulin Pen Needle (BD PEN NEEDLE NANO U/F) 32G X 4 MM MISC 1 each by Does not apply route daily. 10/17/16   Tysinger, Camelia Eng, PA-C  levofloxacin (LEVAQUIN) 500 MG tablet Take 1 tablet (500 mg total) by mouth daily. 05/19/17 05/26/17  Doreatha Lew, MD  nicotine (NICODERM CQ - DOSED IN MG/24 HOURS) 21 mg/24hr patch Place 1 patch (21 mg total) onto the skin daily. 05/19/17   Doreatha Lew, MD  oxyCODONE-acetaminophen (PERCOCET) 7.5-325 MG tablet Take 1 tablet by mouth 3 (three) times daily as needed for pain. 04/07/17   [provider]  pravastatin (PRAVACHOL) 20 MG tablet Take 1 tablet (20 mg total) by mouth daily. 10/17/16   Tysinger, Camelia Eng, PA-C  predniSONE (DELTASONE) 10 MG tablet Take 4 tablets for 3 days; Take 3 tablets for 4 days; Take 2 tablets for 3 days; Take 1 tablet for 4 days 05/19/17   Patrecia Pour, Christean Grief, MD  [facility] HFA [phone] Base) MCG/ACT inhaler Inhale 1-2 puffs into the lungs every 6 (six) hours as needed for wheezing or cough. 04/29/17   [provider]  QUEtiapine (SEROQUEL) 400 MG tablet TAKE 2 TABLETS (800 MG TOTAL) BY MOUTH AT BEDTIME. 10/17/16   Tysinger, Camelia Eng, PA-C  Vitamin D, Ergocalciferol, (DRISDOL) 50000 units CAPS capsule Take 1 capsule (50,000 Units total) by mouth every 7 (seven) days. Patient not taking: Reported on 05/17/2017 11/05/16   Lyndal Pulley, DO    Family History Family History  Problem Relation Age of  Onset  . Diabetes Mellitus II Father   . Diabetes Brother   . Heart disease Neg Hx   . Stroke Neg Hx   . Kidney disease Neg Hx     Social History Social History  Substance Use Topics  . Smoking status: Current Every Day Smoker    Packs/day: 1.00    Years: 39.00    Types: Cigarettes  . Smokeless tobacco: Never Used     Comment: pt states that she is using the E cigs and is trying to quit  . Alcohol use No     Allergies   Patient has no known allergies.   Review of Systems Review of Systems  Constitutional: Negative for chills and fever.  HENT: Negative for congestion, nosebleeds and sore throat.   Eyes: Negative for visual disturbance.  Respiratory: Negative for cough and shortness of breath.   Cardiovascular: Negative for chest pain.  Gastrointestinal: Negative  for abdominal pain, blood in stool, diarrhea, nausea and vomiting.  Genitourinary: Negative for dysuria and hematuria.  Musculoskeletal: Negative for back pain and neck pain.  Skin: Positive for color change. Negative for rash and wound.  Neurological: Negative for dizziness, weakness, light-headedness and headaches.     Physical Exam Updated Vital Signs BP 136/86 (BP Location: Right Arm)   Pulse 95   Temp 98.9 F (37.2 C) (Oral)   Resp 18   SpO2 96%   Physical Exam  Constitutional: She is oriented to person, place, and time. She appears well-developed and well-nourished. No distress.  Nontoxic-appearing.  HENT:  Head: Normocephalic and atraumatic.  Mouth/Throat: Oropharynx is clear and moist.  Eyes: Pupils are equal, round, and reactive to light. Conjunctivae are normal. Right eye exhibits no discharge. Left eye exhibits no discharge.  Neck: Neck supple. No JVD present.  Cardiovascular: Normal rate, regular rhythm, normal heart sounds and intact distal pulses.   Pulmonary/Chest: Effort normal and breath sounds normal. No stridor. No respiratory distress. She has no wheezes. She has no rales.    Abdominal: Soft. Bowel sounds are normal. She exhibits no distension and no mass. There is no tenderness. There is no rebound and no guarding.  Abdomen is soft and non-tender to palpation.  Bowel sounds are present. Patient has an area of bruising noted to her left lower abdomen. It is nontender to palpation. No other bruising noted.  Musculoskeletal: She exhibits no edema.  Lymphadenopathy:    She has no cervical adenopathy.  Neurological: She is alert and oriented to person, place, and time. No sensory deficit. Coordination normal.  Skin: Skin is warm and dry. Capillary refill takes less than 2 seconds. No rash noted. She is not diaphoretic. No erythema. No pallor.  Area of ecchymosis noted to her left lower abdomen. No other area of ecchymosis noted. No gum bleeding.  Psychiatric: She has a normal mood and affect. Her behavior is normal.  Nursing note and vitals reviewed.    ED Treatments / Results  Labs (all labs ordered are listed, but only abnormal results are displayed) Labs Reviewed  BASIC METABOLIC PANEL - Abnormal; Notable for the following:       Result Value   Chloride 100 (*)    Glucose, Bld 497 (*)    Creatinine, Ser 1.01 (*)    All other components within normal limits  CBC WITH DIFFERENTIAL/PLATELET - Abnormal; Notable for the following:    WBC 11.7 (*)    Platelets 440 (*)    Neutro Abs 8.9 (*)    All other components within normal limits  CBG MONITORING, ED - Abnormal; Notable for the following:    Glucose-Capillary 512 (*)    All other components within normal limits  CBG MONITORING, ED - Abnormal; Notable for the following:    Glucose-Capillary 373 (*)    All other components within normal limits  PROTIME-INR    EKG  EKG Interpretation None       Radiology No results found.  Procedures Procedures (including critical care time)  Medications Ordered in ED Medications  sodium chloride 0.9 % bolus 1,000 mL (0 mLs Intravenous Stopped 05/21/17 1917)   insulin aspart (novoLOG) injection 10 Units (10 Units Intravenous Given 05/21/17 1755)     Initial Impression / Assessment and Plan / ED Course  I have reviewed the triage vital signs and the nursing notes.  Pertinent labs & imaging results that were available during my care of the patient were reviewed by  me and considered in my medical decision making (see chart for details).    This  is a 56 y.o. Female who presents to the emergency department complaining of bruising to her abdomen after she received injections to her abdomen while being admitted a few days ago. Patient reports she was receiving injections in her abdomen. Based on medical records and [facility] she was receiving Lovenox injections for about a day while she was admitted to the hospital. These were later discontinued. She complains of some bruising to her abdomen and denies any pain to her abdomen. She denies any hematemesis or hematochezia. No gum bleeding. She also tells me she did not take her Levemir injection today prior to arrival. She was distracted about the abdominal bruising and just forgot. She reports she has plenty of insulin at home. She denies other bruising or bleeding. She reports overall she feels well and has no other complaints. She is not currently on anticoagulants. On exam patient is afebrile nontoxic appearing. She is some superficial ecchymosis noted to her left lower abdomen. There is no tenderness overlying. Her abdomen is soft and nontender. She has no other areas of ecchymosis noted. Blood work was obtained and BMP is remarkable for a glucose of 497. Normal sodium and potassium. Normal anion gap. CBC is remarkable for white count of 11,700, hemoglobin of 12.6, and a platelet count of 440,000. INR is 0.97. Patient received fluid bolus and IV insulin. This brought her sugar down to 3075. She has insulin at home. I encouraged her to be compliant with her insulin at home. She agrees. She tells me she does not need  any refills. I provided reassurance about her bruising after receiving Lovenox injections. Blood work is reassuring. We'll discharge and close follow-up by primary care. I advised the patient to follow-up with their primary care provider this week. I advised the patient to return to the emergency department with new or worsening symptoms or new concerns. The patient verbalized understanding and agreement with plan.   This patient was discussed with Dr. Zenia Resides who agrees with assessment and plan.   Final Clinical Impressions(s) / ED Diagnoses   Final diagnoses:  Bruising  Type 2 diabetes mellitus with hyperglycemia, unspecified whether long term insulin use [facility])    New Prescriptions New Prescriptions   No medications on file     Sharmaine Base 05/21/17 2012    Lacretia Leigh, MD 05/21/17 2206

## 2017-05-21 NOTE — ED Triage Notes (Signed)
Pt here for bruising to stomach after getting anticoagulant injections while in hospital

## 2017-05-22 LAB — CULTURE, BLOOD (ROUTINE X 2)
CULTURE: NO GROWTH
CULTURE: NO GROWTH
SPECIAL REQUESTS: ADEQUATE
Special Requests: ADEQUATE

## 2017-05-24 ENCOUNTER — Other Ambulatory Visit: Payer: Self-pay | Admitting: Medical

## 2017-05-28 ENCOUNTER — Other Ambulatory Visit: Payer: Self-pay | Admitting: Physician Assistant

## 2017-05-28 DIAGNOSIS — Z1231 Encounter for screening mammogram for malignant neoplasm of breast: Secondary | ICD-10-CM

## 2017-06-05 ENCOUNTER — Other Ambulatory Visit: Payer: Self-pay | Admitting: Medical

## 2017-06-09 ENCOUNTER — Telehealth: Payer: Self-pay | Admitting: Family Medicine

## 2017-06-09 NOTE — Telephone Encounter (Signed)
[facility] faxed request for Quetiapine Fumarate 400 mg  Patient is past due for Diabetes follow up.  Called pt reached voice mail lmtrc needs appt.

## 2017-06-09 NOTE — Telephone Encounter (Signed)
Get in for recheck!

## 2017-06-10 NOTE — Telephone Encounter (Signed)
Pt is not a pt in our practice. She transferred to another PCP.

## 2017-06-24 ENCOUNTER — Other Ambulatory Visit: Payer: Self-pay | Admitting: Orthopaedic Surgery

## 2017-06-24 DIAGNOSIS — M961 Postlaminectomy syndrome, not elsewhere classified: Secondary | ICD-10-CM

## 2017-06-25 ENCOUNTER — Ambulatory Visit
Admission: RE | Admit: 2017-06-25 | Discharge: 2017-06-25 | Disposition: A | Discharge status: Home / Self Care | Payer: BLUE CROSS/BLUE SHIELD | Source: Ambulatory Visit | Attending: Physician Assistant | Admitting: Physician Assistant

## 2017-06-25 DIAGNOSIS — Z1231 Encounter for screening mammogram for malignant neoplasm of breast: Secondary | ICD-10-CM

## 2017-07-06 ENCOUNTER — Other Ambulatory Visit: Payer: Self-pay | Admitting: Medical

## 2017-07-06 ENCOUNTER — Ambulatory Visit
Admission: RE | Admit: 2017-07-06 | Discharge: 2017-07-06 | Disposition: A | Discharge status: Home / Self Care | Payer: BLUE CROSS/BLUE SHIELD | Source: Ambulatory Visit | Attending: Orthopaedic Surgery | Admitting: Orthopaedic Surgery

## 2017-07-06 DIAGNOSIS — M961 Postlaminectomy syndrome, not elsewhere classified: Secondary | ICD-10-CM

## 2017-07-06 MED ORDER — GADOBENATE DIMEGLUMINE 529 MG/ML IV SOLN
15.0000 mL | Freq: Once | INTRAVENOUS | Status: AC | PRN
  Administered 2017-07-06: 15 mL via INTRAVENOUS

## 2017-08-18 ENCOUNTER — Telehealth: Payer: Self-pay | Admitting: Family Medicine

## 2017-08-18 NOTE — Telephone Encounter (Signed)
Lori with [facility] called for copy of ekg to be faxed to [phone].  Done  Also called patient as time for Diabetes follow up with [facility].  Reached Clinical biochemist.

## 2017-09-08 ENCOUNTER — Other Ambulatory Visit: Payer: Self-pay | Admitting: Medical

## 2018-05-21 ENCOUNTER — Encounter: Payer: Self-pay | Admitting: Family Medicine

## 2018-05-21 ENCOUNTER — Ambulatory Visit: Payer: Self-pay | Attending: Family Medicine | Admitting: Family Medicine

## 2018-05-21 VITALS — BP 123/75 | HR 81 | Temp 98.5°F | Resp 18 | Ht 67.0 in | Wt 165.0 lb

## 2018-05-21 DIAGNOSIS — R011 Cardiac murmur, unspecified: Secondary | ICD-10-CM

## 2018-05-21 DIAGNOSIS — Z794 Long term (current) use of insulin: Secondary | ICD-10-CM | POA: Insufficient documentation

## 2018-05-21 DIAGNOSIS — K219 Gastro-esophageal reflux disease without esophagitis: Secondary | ICD-10-CM | POA: Insufficient documentation

## 2018-05-21 DIAGNOSIS — I10 Essential (primary) hypertension: Secondary | ICD-10-CM

## 2018-05-21 DIAGNOSIS — R350 Frequency of micturition: Secondary | ICD-10-CM | POA: Insufficient documentation

## 2018-05-21 DIAGNOSIS — Z1231 Encounter for screening mammogram for malignant neoplasm of breast: Secondary | ICD-10-CM

## 2018-05-21 DIAGNOSIS — Z803 Family history of malignant neoplasm of breast: Secondary | ICD-10-CM | POA: Insufficient documentation

## 2018-05-21 DIAGNOSIS — E782 Mixed hyperlipidemia: Secondary | ICD-10-CM

## 2018-05-21 DIAGNOSIS — Z9889 Other specified postprocedural states: Secondary | ICD-10-CM | POA: Insufficient documentation

## 2018-05-21 DIAGNOSIS — E1165 Type 2 diabetes mellitus with hyperglycemia: Secondary | ICD-10-CM

## 2018-05-21 DIAGNOSIS — F3170 Bipolar disorder, currently in remission, most recent episode unspecified: Secondary | ICD-10-CM

## 2018-05-21 DIAGNOSIS — Z1239 Encounter for other screening for malignant neoplasm of breast: Secondary | ICD-10-CM

## 2018-05-21 DIAGNOSIS — F419 Anxiety disorder, unspecified: Secondary | ICD-10-CM | POA: Insufficient documentation

## 2018-05-21 LAB — POCT GLYCOSYLATED HEMOGLOBIN (HGB A1C): HbA1c, POC (controlled diabetic range): 14.6 % — AB (ref 0.0–7.0)

## 2018-05-21 LAB — GLUCOSE, POCT (MANUAL RESULT ENTRY): POC Glucose: 344 mg/dL — AB (ref 70–99)

## 2018-05-21 MED ORDER — QUETIAPINE FUMARATE 400 MG PO TABS: ORAL_TABLET | ORAL | 3 refills | Status: DC

## 2018-05-21 MED ORDER — INSULIN PEN NEEDLE 32G X 4 MM MISC: 1.0000 | Freq: Every day | 3 refills | Status: DC

## 2018-05-21 MED FILL — QUETIAPINE FUMARATE 400 MG: 400 | 30 days supply | Qty: 60 | Fill #0

## 2018-05-21 NOTE — Patient Instructions (Signed)
Hemoglobin A1c Test Some of the sugar (glucose) that circulates in your blood sticks or binds to blood proteins. Hemoglobin (Hb or Hgb) is one type of blood protein that glucose binds to. It also carries oxygen in the red blood cells (RBCs). When glucose binds to Hb, the glucose-coated Hb is called glycated Hb. Once Hb is glycated, it remains that way for the life of the RBC. This is about 120 days. Rather than testing your blood glucose level on one single day, the hemoglobin A1c (HbA1c) test measures the average amount of glycated hemoglobin and, therefore, the average amount of glucose in your blood during the 3-4 months just before the test is done. The HbA1c test is used to monitor long-term control of blood sugar in people who have diabetes mellitus. The HbA1c test can also be used in addition to or in combination with fasting blood glucose level and oral glucose tolerance tests. What do the results mean? It is your responsibility to obtain your test results. Ask the lab or department performing the test when and how you will get your results. Contact your health care provider to discuss any questions you have about your results. Range of Normal Values Ranges for normal values may vary among different labs and hospitals. You should always check with your health care provider after having lab work or other tests done to discuss the meaning of your test results and whether your values are considered within normal limits. The ranges for normal HbA1c test results are as follows:  Adult or child without diabetes: 4-5.9%.  Adult or child with diabetes and good blood glucose control: less than 6.5%.  Several factors can affect HbA1c test results. These may include:  Diseases (hemoglobinopathies) that cause a change in the shape, size, or amount of Hb in your blood.  Longer than normal RBC life span.  Abnormally low levels of certain proteins in your blood.  Eating foods or taking supplements that  are high in vitamin C (ascorbic acid).  Meaning of Results Outside Normal Value Ranges Abnormally high HbA1c values are most commonly an indication of prediabetes mellitus and diabetes mellitus:  An HbA1c result of 5.7-6.4% is considered diagnostic of prediabetes mellitus.  An HbA1c result of 6.5% or higher on two separate occasions is considered diagnostic of diabetes mellitus.  Abnormally low HbA1c values can be caused by several health conditions. These may include:  Pregnancy.  A large amount of blood loss.  Blood transfusions.  Low red blood cell count (anemia). This is caused by premature destruction of red blood cells.  Long-term kidney failure.  Some unusual forms of Hb (Hb variants), such as sickle cell trait.  Discuss your test results with your health care provider. He or she will use the results to make a diagnosis and determine a treatment plan that is right for you. Talk with your health care provider to discuss your results, treatment options, and if necessary, the need for more tests. Talk with your health care provider if you have any questions about your results. This information is not intended to replace advice given to you by your health care provider. Make sure you discuss any questions you have with your health care provider. Document Released: 10/14/2004 Document Revised: 06/18/2016 Document Reviewed: 02/06/2014 Elsevier Interactive Patient Education  2018 [employer].  Bipolar 1 Disorder Bipolar 1 disorder is a mental health disorder in which a person has episodes of emotional highs (mania), and may also have episodes of emotional lows (depression) in  addition to highs. Bipolar 1 disorder is different from other bipolar disorders because it involves extreme manic episodes. These episodes last at least one week or involve symptoms that are so severe that hospitalization is needed to keep the person safe. What increases the risk? The cause of this condition is  not known. However, certain factors make you more likely to have bipolar disorder, such as:  Having a family member with the disorder.  An imbalance of certain chemicals in the brain (neurotransmitters).  Stress, such as illness, financial problems, or a death.  Certain conditions that affect the brain or spinal cord (neurologic conditions).  Brain injury (trauma).  Having another mental health disorder, such as: ? Obsessive compulsive disorder. ? Schizophrenia.  What are the signs or symptoms? Symptoms of mania include:  Very high self-esteem or self-confidence.  Decreased need for sleep.  Unusual talkativeness or feeling a need to keep talking. Speech may be very fast. It may seem like you cannot stop talking.  Racing thoughts or constant talking, with quick shifts between topics that may or may not be related (flight of ideas).  Decreased ability to focus or concentrate.  Increased purposeful activity, such as work, studies, or social activity.  Increased nonproductive activity. This could be pacing, squirming and fidgeting, or finger and toe tapping.  Impulsive behavior and poor judgment. This may result in high-risk activities, such as having unprotected sex or spending a lot of money.  Symptoms of depression include:  Feeling sad, hopeless, or helpless.  Frequent or uncontrollable crying.  Lack of feeling or caring about anything.  Sleeping too much.  Moving more slowly than usual.  Not being able to enjoy things you used to enjoy.  Wanting to be alone all the time.  Feeling guilty or worthless.  Lack of energy or motivation.  Trouble concentrating or remembering.  Trouble making decisions.  Increased appetite.  Thoughts of death, or the desire to harm yourself.  Sometimes, you may have a mixed mood. This means having symptoms of depression and mania. Stress can make symptoms worse. How is this diagnosed? To diagnose bipolar disorder, your health  care provider may ask about your:  Emotional episodes.  Medical history.  Alcohol and drug use. This includes prescription medicines. Certain medical conditions and substances can cause symptoms that seem like bipolar disorder (secondary bipolar disorder).  How is this treated? Bipolar disorder is a long-term (chronic) illness. It is best controlled with ongoing (continuous) treatment rather than treatment only when symptoms occur. Treatment may include:  Medicine. Medicine can be prescribed by a provider who specializes in treating mental disorders (psychiatrist). ? Medicines called mood stabilizers are usually prescribed. ? If symptoms occur even while taking a mood stabilizer, other medicines may be added.  Psychotherapy. Some forms of talk therapy, such as cognitive-behavioral therapy (CBT), can provide support, education, and guidance.  Coping methods, such as journaling or relaxation exercises. These may include: ? Yoga. ? Meditation. ? Deep breathing.  Lifestyle changes, such as: ? Limiting alcohol and drug use. ? Exercising regularly. ? Getting plenty of sleep. ? Making healthy eating choices.  A combination of medicine, talk therapy, and coping methods is best. A procedure in which electricity is applied to the brain through the scalp (electroconvulsive therapy) may be used in cases of severe mania when medicine and psychotherapy work too slowly or do not work. Follow these instructions at home: Activity   Return to your normal activities as told by your health care provider.  Find activities that you enjoy, and make time to do them.  Exercise regularly as told by your health care provider. Lifestyle  Limit alcohol intake to no more than 1 drink a day for nonpregnant women and 2 drinks a day for men. One drink equals 12 oz of beer, 5 oz of wine, or 1 oz of hard liquor.  Follow a set schedule for eating and sleeping.  Eat a balanced diet that includes fresh fruits  and vegetables, whole grains, low-fat dairy, and lean meat.  Get 7-8 hours of sleep each night. General instructions  Take over-the-counter and prescription medicines only as told by your health care provider.  Think about joining a support group. Your health care provider may be able to recommend a support group.  Talk with your family and loved ones about your treatment goals and how they can help.  Keep all follow-up visits as told by your health care provider. This is important. Where to find more information: For more information about bipolar disorder, visit the following websites:  [employer] on Mental Illness: www.nami.[place]: https://carter.com/  Contact a health care provider if:  Your symptoms get worse.  You have side effects from your medicine, and they get worse.  You have trouble sleeping.  You have trouble doing daily activities.  You feel unsafe in your surroundings.  You are dealing with substance abuse. Get help right away if:  You have new symptoms.  You have thoughts about harming yourself.  You self-harm. This information is not intended to replace advice given to you by your health care provider. Make sure you discuss any questions you have with your health care provider. Document Released: 12/29/2000 Document Revised: 05/18/2016 Document Reviewed: 05/22/2016 Elsevier Interactive Patient Education  [employer].

## 2018-05-21 NOTE — Progress Notes (Signed)
Subjective:    Patient ID: Ashley [name], female    DOB: 18-Jun-1961, 57 y.o.   MRN: [id number]  HPI  57 yo female new to the practice who is being seen to establish care for and ongoing medical management of her Diabetes, Hypertension, Hyperlipidemia and Bipolar 1 Disorder. Patient was previously established with [facility]).  Patient states that she does have insulin but has been out of her pen needles.  Patient states that she was trying to use the needles at first but has not taken her insulin in about 3 months.  Patient states that she does have her oral medications for diabetes, blood pressure and hyperlipidemia.  Patient is accompanied by her adult daughter.  Patient states that she is originally from [place] and moved to this area about 20 years ago but then moved back to [place] but then returned a few years ago.  Patient reports that she is status post lumbar spine surgery x2 as she had a fall in 2017 which caused her back injury.  Patient states that overall she feels well however she is out of Seroquel.  Patient was unable to give a reason why she is taking Seroquel.  Patient denies any current anxiety or depression.  Patient however states that she believes she will become anxious if she continues to be out of the Seroquel as she states that without the Seroquel, she is not able to sleep well.  Patient states that when she takes the Seroquel she sleeps well and feels refreshed.  Patient states that without the Seroquel, she feels as if her body is shutting down.  Patient denies suicidal thoughts or ideations.  Patient has had some increased thirst, urinary frequency.  Patient has not been monitoring her blood sugars.  Patient denies any headaches or dizziness related to her blood pressure.  Patient has had no chest pain or palpitations.  Patient has some occasional tingling/numbness in her feet but this occurs randomly and is not constant.  Patient denies any side  effects from her current medications.  Patient states that her sister has had breast cancer and patient states that she recently received a letter stating that she needs her mammogram.  Patient is concerned because she does not currently have insurance and is not sure that she can afford her mammogram.  Past Medical History:  Diagnosis Date  . Anxiety   . Arthritis   . Bipolar 1 disorder ([place])   . Depression   . Diabetes mellitus without complication ([place])    Type II  . GERD (gastroesophageal reflux disease)   . Heart murmur    "nothing to worry about"  . HTN (hypertension)    not on medication   Past Surgical History:  Procedure Laterality Date  . ABDOMINAL HYSTERECTOMY    . LUMBAR LAMINECTOMY/DECOMPRESSION MICRODISCECTOMY Left 04/07/2016   Procedure: Left Lumbar four-five Microdiskectomy;  Surgeon: Erline Levine, MD;  Location: [facility];  Service: Neurosurgery;  Laterality: Left;   Family History  Problem Relation Age of Onset  . Diabetes Mellitus II Father   . Diabetes Brother   . Heart disease Neg Hx   . Stroke Neg Hx   . Kidney disease Neg Hx   No Known Allergies    Review of Systems  Constitutional: Positive for fatigue. Negative for chills and fever.  HENT: Negative for congestion, sore throat and trouble swallowing.   Eyes: Negative for visual disturbance.  Respiratory: Negative for cough and  shortness of breath.   Cardiovascular: Negative for chest pain, palpitations and leg swelling.  Gastrointestinal: Negative for abdominal pain, constipation, diarrhea and nausea.  Musculoskeletal: Positive for back pain (improved after surgery). Negative for gait problem.  Neurological: Positive for numbness (occasional numbness/tingling in feet). Negative for dizziness and headaches.  Psychiatric/Behavioral: Positive for sleep disturbance (sleep problems since being out of seroquel). Negative for suicidal ideas.       Objective:   Physical Exam  Constitutional: She is  oriented to person, place, and time. She appears well-developed and well-nourished. No distress.  Older female; accompanied by her adult daughter  HENT:  Head: Normocephalic and atraumatic.  Right Ear: Tympanic membrane normal.  Left Ear: Tympanic membrane normal.  Nose: Nose normal.  Mouth/Throat: Oropharynx is clear and moist.   missing some bottom teeth  Eyes: Pupils are equal, round, and reactive to light. Conjunctivae and EOM are normal.  Neck: Normal range of motion. Neck supple. No thyromegaly present.  Cardiovascular: Regular rhythm and intact distal pulses.  Murmur heard. Pulses:      Dorsalis pedis pulses are 1+ on the right side, and 1+ on the left side.       Posterior tibial pulses are 1+ on the right side, and 1+ on the left side.  Patient with possible noise/bruit in the left carotid however this may represent referred noise from her heart murmur  Pulmonary/Chest: Effort normal and breath sounds normal.  Musculoskeletal: She exhibits no edema or tenderness.       Right foot: There is normal range of motion and no deformity.       Left foot: There is normal range of motion and no deformity.  Feet:  Right Foot:  Protective Sensation: 10 sites tested. 9 sites sensed.  Skin Integrity: Negative for ulcer, blister, skin breakdown, erythema, warmth, callus or dry skin.  Left Foot:  Protective Sensation: 10 sites tested. 10 sites sensed.  Skin Integrity: Negative for ulcer, blister, skin breakdown, erythema, warmth, callus or dry skin.  Lymphadenopathy:    She has no cervical adenopathy.  Neurological: She is alert and oriented to person, place, and time. No cranial nerve deficit.  Skin:  Diabetic foot exam performed; absent monofilament sensation on the right heel  Psychiatric: She has a normal mood and affect. Thought content normal.  Patient is animated and has some rapid speech (speech pattern may be normal but this is the first time that I have seen the patient)    Nursing note and vitals reviewed.  BP 123/75 (BP Location: Left Arm, Patient Position: Sitting, Cuff Size: Normal)   Pulse 81   Temp 98.5 F (36.9 C) (Oral)   Resp 18   Ht 5\' 7"  (1.702 m)   Wt 165 lb (74.8 kg)   SpO2 96%   BMI 25.84 kg/m      Assessment & Plan:  1. Uncontrolled type 2 diabetes mellitus with hyperglycemia ([place]) Patient with uncontrolled type 2 diabetes with hyperglycemia.  Patient states that she has been out of her pen needles and therefore has not had any of her insulin in 3 months.  Patient states that she does take her oral diabetic medications.  Patient's blood sugar was elevated at today's visit and patient was given 8 units of regular insulin.  Patient's blood sugar after insulin decreased to 91.  Patient was given new prescription for pen needles that she can restart use of insulin.  Patient was asked to monitor her blood sugars and bring both her  glucometer and blood sugar diary with her for an appointment in approximately 2 to 3 weeks with the clinical pharmacist here.  Patient will follow-up in the clinic in 4 months but sooner if any concerns her blood sugars remain elevated.  Discussed hemoglobin A1c goal of 7 or less as patient's hemoglobin A1c today was 14. Diabetic foot care discussed and handout on diabetes. - Glucose (CBG) - HgB A1c - Comprehensive metabolic panel - Insulin Pen Needle (BD PEN NEEDLE NANO U/F) 32G X 4 MM MISC; 1 each by Does not apply route daily.  Dispense: 100 each; Refill: 3  2. Essential hypertension Patient's blood pressure is controlled on current amlodipine.  I am unsure why patient is not on an ACE inhibitor but will discuss this with the patient at her next visit.  Patient at this point will continue use of amlodipine.  Will obtain CMP to assess renal function.  Patient will have urine microalbumin at follow-up.  3. Mixed dyslipidemia Patient with mixed dyslipidemia per past records.  Patient is currently on pravastatin.  Patient  will have CMP to check liver function test and electrolytes.  Patient encouraged to continue low-fat diet.  Patient is not fasting at today's visit. - Comprehensive metabolic panel  4. Bipolar affective disorder in remission [facility]) On review of available past records, patient is on Seroquel for bipolar affective disorder which is currently in remission.  Patient is provided with refill of Seroquel.  Patient declined referral for counseling. Patient information provided on Bipolar Disorder. - QUEtiapine (SEROQUEL) 400 MG tablet; TAKE 2 TABLETS (800 MG TOTAL) BY MOUTH AT BEDTIME.  Dispense: 60 tablet; Refill: 3  5. Heart murmur Patient with heart murmur on exam.  Patient reports she has had past evaluation.  If medical records cannot be found regarding past evaluation, patient may need echocardiogram.  6.  Screening for breast cancer Patient reports family history of sister with breast cancer and patient is due for her screening mammogram.  Referral placed for patient to have screening mammogram.  An After Visit Summary was printed and given to the patient.  Return in about 4 months (around 09/20/2018) for diabetes-2-3 weeks with CPP; 4 months with PCP.

## 2018-05-22 LAB — COMPREHENSIVE METABOLIC PANEL WITH GFR
ALT: 19 IU/L (ref 0–32)
AST: 19 IU/L (ref 0–40)
Albumin/Globulin Ratio: 1.6 (ref 1.2–2.2)
Albumin: 4.3 g/dL (ref 3.5–5.5)
Alkaline Phosphatase: 130 IU/L — ABNORMAL HIGH (ref 39–117)
BUN/Creatinine Ratio: 14 (ref 9–23)
BUN: 13 mg/dL (ref 6–24)
Bilirubin Total: <0.2 mg/dL (ref 0.0–1.2)
CO2: 24 mmol/L (ref 20–29)
Calcium: 9.8 mg/dL (ref 8.7–10.2)
Chloride: 100 mmol/L (ref 96–106)
Creatinine, Ser: 0.9 mg/dL (ref 0.57–1.00)
GFR calc Af Amer: 81 mL/min/1.73
GFR calc non Af Amer: 70 mL/min/1.73
Globulin, Total: 2.7 g/dL (ref 1.5–4.5)
Glucose: 294 mg/dL — ABNORMAL HIGH (ref 65–99)
Potassium: 4.1 mmol/L (ref 3.5–5.2)
Sodium: 141 mmol/L (ref 134–144)
Total Protein: 7 g/dL (ref 6.0–8.5)

## 2018-05-26 ENCOUNTER — Telehealth: Payer: Self-pay | Admitting: *Deleted

## 2018-05-26 NOTE — Telephone Encounter (Signed)
Unable to reach patient. Left message on voicemail to return call.   Notes recorded by Antony Blackbird, MD on 05/25/2018 at 7:05 PM EDT Notify patient that her CMP was normal except for her blood sugar of 294 and she has a mild increase in a non-specific liver enzyme, alkaline phosphatase and her CMP will be repeated at a future visit.

## 2018-05-27 NOTE — Telephone Encounter (Signed)
-----   Message from Antony Blackbird, MD sent at 05/25/2018  7:05 PM EDT ----- Notify patient that her CMP was normal except for her blood sugar of 294 and she has a mild increase in a non-specific liver enzyme, alkaline phosphatase and her CMP will be repeated at a future visit.

## 2018-05-27 NOTE — Telephone Encounter (Signed)
Medical Assistant left message on patient's home and cell voicemail. Voicemail states to give a call back to [place] with [facility] at [phone]. !!!Please inform patient of mineral levels being normal except for a liver enzyme, phosphate and glucose being elevated. We will repeat the test at the next visit!!!

## 2018-05-28 ENCOUNTER — Other Ambulatory Visit: Payer: Self-pay | Admitting: Physician Assistant

## 2018-05-28 DIAGNOSIS — Z1231 Encounter for screening mammogram for malignant neoplasm of breast: Secondary | ICD-10-CM

## 2018-06-04 ENCOUNTER — Ambulatory Visit: Payer: Self-pay | Attending: Family Medicine | Admitting: Pharmacist

## 2018-06-04 ENCOUNTER — Encounter: Payer: Self-pay | Admitting: Pharmacist

## 2018-06-04 ENCOUNTER — Other Ambulatory Visit: Payer: Self-pay | Admitting: Pharmacist

## 2018-06-04 DIAGNOSIS — Z794 Long term (current) use of insulin: Secondary | ICD-10-CM | POA: Insufficient documentation

## 2018-06-04 DIAGNOSIS — E1165 Type 2 diabetes mellitus with hyperglycemia: Secondary | ICD-10-CM | POA: Insufficient documentation

## 2018-06-04 LAB — POCT URINALYSIS DIP (CLINITEK)
BILIRUBIN UA: NEGATIVE
Blood, UA: NEGATIVE
Ketones, POC UA: NEGATIVE mg/dL
Leukocytes, UA: NEGATIVE
NITRITE UA: POSITIVE — AB
Spec Grav, UA: 1.015 (ref 1.010–1.025)
Urobilinogen, UA: 0.2 E.U./dL
pH, UA: 5 (ref 5.0–8.0)

## 2018-06-04 LAB — GLUCOSE, POCT (MANUAL RESULT ENTRY): POC Glucose: 502 mg/dl — AB (ref 70–99)

## 2018-06-04 MED ORDER — INSULIN ASPART 100 UNIT/ML ~~LOC~~ SOLN: 10.0000 [IU] | Freq: Once | SUBCUTANEOUS | Status: DC

## 2018-06-04 MED ORDER — INSULIN LISPRO 100 UNIT/ML (KWIKPEN): PEN_INJECTOR | SUBCUTANEOUS | 2 refills | Status: DC

## 2018-06-04 MED ORDER — INSULIN PEN NEEDLE 32G X 4 MM MISC: 11 refills | Status: DC

## 2018-06-04 MED ORDER — INSULIN GLARGINE 100 UNIT/ML SOLOSTAR PEN: PEN_INJECTOR | SUBCUTANEOUS | 2 refills | Status: DC

## 2018-06-04 MED ORDER — INSULIN DETEMIR 100 UNIT/ML FLEXPEN: 33.0000 [IU] | PEN_INJECTOR | Freq: Every day | SUBCUTANEOUS | 2 refills | Status: DC

## 2018-06-04 MED ORDER — INSULIN ASPART 100 UNIT/ML FLEXPEN: PEN_INJECTOR | SUBCUTANEOUS | 2 refills | Status: DC

## 2018-06-04 MED FILL — !LANTUS SOLOSTAR 100UNITS/M: 100 | 27 days supply | Qty: 9 | Fill #0

## 2018-06-04 MED FILL — !HUMALOG 100 UNITS/ML KWIKP: 100 | 30 days supply | Qty: 3 | Fill #0

## 2018-06-04 NOTE — Patient Instructions (Signed)
Thank you for coming to see me today. Please do the following:  1. Start Levemir 33 units before bedtime.  2. Start Novolog 5 units before your 2 largest meals. 3. Continue checking blood sugars at home. It's really important that you record these and bring these in to your next doctor's appointment. If you get in readings above 500 or lower than 70, call me or the clinic to let your doctor know. See below on how to treat low blood sugar.  4. Continue making the lifestyle changes we've discussed together during our visit. Diet and exercise play a significant role in improving your blood sugars.  Follow-up with me in 2 weeks.  Hypoglycemia or low blood sugar:   Low blood sugar can happen quickly and may become an emergency if not treated right away.   While this shouldn't happen often, it can be brought upon if you skip a meal or do not eat enough. Also, if your insulin or other diabetes medications are dosed too high, this can cause your blood sugar to go to low.   Warning signs of low blood sugar include: 1. Feeling shaky or dizzy 2. Feeling weak or tired  3. Excessive hunger 4. Feeling anxious or upset  5. Sweating even when you aren't exercising  What to do if I experience low blood sugar? 1. Check your blood sugar with your meter. If lower than 70, proceed to step 2.  2. Treat with 3-4 glucose tablets or 3 packets of regular sugar. If these aren't around, you can try hard candy. Yet another option would be to drink 4 ounces of fruit juice or 6 ounces of REGULAR soda.  3. Re-check your sugar in 15 minutes. If it is still below 70, do what you did in step 2 again. If has come back up, go ahead and eat a snack or small meal at this time.

## 2018-06-04 NOTE — Progress Notes (Signed)
    S:    PCP: Dr. Chapman Fitch  No chief complaint on file.  Patient arrives in good spirits.  Presents for diabetes evaluation, education, and management at the request of Dr. Chapman Fitch. Patient was referred on and last seen by Dr. Chapman Fitch 05/21/18.   Family/Social History:  - FH: DM (brother) - Tobacco: current every day smoker (1 pack/day) - Alcohol: denies   Human resources officer affordability:  - Patient is uninsured - Receives medication via patient assistance through our pharmacy  Patient denies adherence with medications.  Current diabetes medications include:  - Glipizide 10 XL daily - Pt not taking - Levemir 15 units. Reports taking 30 daily  Patient denies hypoglycemic events. Patient reports that she does not eat right. She is not limiting carbs, sweetened foods, or sugary beverages.  Patient-reported exercise habits:  - Patient denies exercise   Patient denies nocturia.  Patient reports neuropathy. Patient denies visual changes. Patient denies self foot exams.   O:  POCT: 502; UA ordered; negative for ketones; patient given 10 units Novolog Home sugars: patient does not test blood sugar at home  Lab Results  Component Value Date   HGBA1C 14.6 (A) 05/21/2018   There were no vitals filed for this visit.  Lipid Panel     Component Value Date/Time   CHOL 330 (H) 07/02/2016 1105   TRIG 516 (H) 07/02/2016 1105   HDL 31 (L) 07/02/2016 1105   CHOLHDL 10.6 (H) 07/02/2016 1105   VLDL NOT CALC 07/02/2016 1105   LDLCALC NOT CALC 07/02/2016 1105    Clinical ASCVD: Yes  - history of stroke  A/P: Diabetes longstanding currently uncontrolled based on A1c of 14.9. Patient is able to verbalize appropriate hypoglycemia management plan. Patient is not adherent with medication. Control is suboptimal due to non-compliance, dietary indiscretion, and sedentary lifestyle. Patient is having neuropathic pain with A1c > 10 and POCT glucose of >500 today in clinic. She was given 10  units of Novolog and sugar decreased to 476. She needs to be managed with insulin at this time.   I have emphasized the seriousness of her DM. She knows that if she must be compliant to continue to see me.   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 Could consider daily Victoza or weekly Trulicity once sugar [id number] start to come down. Will discuss with patient at future encounter. I will forward this information to her PCP.  -Started Lantus (insulin glargine) 33 units daily.  -Started  Humalog (insulin lispro) at 5 units before 2 largest meals  -Discontinued glipizide d/t hypo risk with the Humalog.  -Extensively discussed pathophysiology of DM, recommended lifestyle interventions, dietary effects on glycemic control -Counseled on s/sx of and management of hypoglycemia -Next A1C anticipated 11/19.   ASCVD risk - secondary prevention in patient with DM and previous stroke. Last LDL is not controlled. High intensity statin indicated. Aspirin is indicated.  -Continued aspirin 81 mg  -Continued pravastatin 20 mg for now. Patient appears to have tried simvastatin, atorvastatin, and rosuvastatin in the past. Will address possibility of intensifying therapy at next encounter.   Written patient instructions provided.  Total time in face to face counseling 30 minutes.   Follow up Pharmacist Clinic Visit 06/18/18.    Patient seen with: Marylene Buerger, PharmD Candidate [place] [employer] Class of 2021  Benard Halsted, PharmD, [place] [phone]

## 2018-06-15 MED FILL — QUETIAPINE FUMARATE 400 MG: 400 | 30 days supply | Qty: 60 | Fill #1

## 2018-06-16 ENCOUNTER — Ambulatory Visit: Payer: Medicaid Other | Admitting: Pharmacist

## 2018-06-18 ENCOUNTER — Ambulatory Visit: Payer: Medicaid Other | Admitting: Pharmacist

## 2018-06-18 ENCOUNTER — Ambulatory Visit: Payer: Self-pay | Attending: Family Medicine | Admitting: Pharmacist

## 2018-06-18 ENCOUNTER — Encounter: Payer: Self-pay | Admitting: Pharmacist

## 2018-06-18 VITALS — BP 106/72 | HR 94

## 2018-06-18 DIAGNOSIS — F17210 Nicotine dependence, cigarettes, uncomplicated: Secondary | ICD-10-CM | POA: Insufficient documentation

## 2018-06-18 DIAGNOSIS — Z794 Long term (current) use of insulin: Secondary | ICD-10-CM | POA: Insufficient documentation

## 2018-06-18 DIAGNOSIS — E1165 Type 2 diabetes mellitus with hyperglycemia: Secondary | ICD-10-CM

## 2018-06-18 DIAGNOSIS — E119 Type 2 diabetes mellitus without complications: Secondary | ICD-10-CM | POA: Insufficient documentation

## 2018-06-18 LAB — GLUCOSE, POCT (MANUAL RESULT ENTRY): POC GLUCOSE: 116 mg/dL — AB (ref 70–99)

## 2018-06-18 NOTE — Progress Notes (Signed)
    S:    PCP: Dr. Chapman Fitch  No chief complaint on file.  Patient arrives in good spirits. Presents for diabetes evaluation, education, and management at the request of Dr. Chapman Fitch. Patient was referred on and last seen by Dr. Chapman Fitch 05/21/18. Pt was last seen by me 06/04/18. Glipizide discontinued; Basal-bolus insulin commenced.   Of note, pt does appear diaphoretic and complains of HA, blurred vision, and fatigue. Reports home glucose of 500 this AM. States she has been under stress d/t family conflict. Reports taking 20 units total of Humalog today after her two largest meals.   Family/Social History:  - FH: DM (brother) - Tobacco: current every day smoker (1 pack/day) - Alcohol: denies   Human resources officer affordability:  - Patient is uninsured - Receives medication via patient assistance through our pharmacy  Patient denies adherence with medications.  Current diabetes medications include:  - Lantus 33 units daily - Humalog 5 units BID - reports 10 units bid  - Reports missing 3 doses in last 2 weeks  Patient denies hypoglycemic events.   Diet: - Does not limit carbs  Patient-reported exercise habits:  - Patient denies exercise   Patient reports baseline nocturia.  Patient denies neuropathy. Patient denies visual changes. Patient denies self foot exams.   O:  POCT: 116 Home sugars:  - patient does not bring in glucometer - Reports 200-300s; she claims that her blood sugar was 500 this AM  Lab Results  Component Value Date   HGBA1C 14.6 (A) 05/21/2018   There were no vitals filed for this visit.  Lipid Panel     Component Value Date/Time   CHOL 330 (H) 07/02/2016 1105   TRIG 516 (H) 07/02/2016 1105   HDL 31 (L) 07/02/2016 1105   CHOLHDL 10.6 (H) 07/02/2016 1105   VLDL NOT CALC 07/02/2016 1105   LDLCALC NOT CALC 07/02/2016 1105    Clinical ASCVD: Yes  - history of stroke  A/P: Diabetes longstanding currently uncontrolled based on A1c of 14.9.  Patient with symptoms of hypoglycemia. POCT glucose 116 today (down from reported 500 this AM) . Explained symptoms of relative hypoglycemia. Patient is able to verbalize appropriate hypoglycemia management plan. Patient is not adherent with medications. Control is suboptimal d/t dietary indiscretion, physical inactivity, and medication non-compliance.  -Continued Lantus (insulin glargine) 33 units daily.  -Increased Humalog (insulin lispro) from 5 to 10 units before 2 largest meals  -Extensively discussed pathophysiology of DM, recommended lifestyle interventions, dietary effects on glycemic control -Counseled on s/sx of and management of hypoglycemia -Next A1C anticipated 11/19.   ASCVD risk - secondary prevention in patient with DM and previous stroke. Last LDL is not controlled. High intensity statin indicated. Aspirin is indicated.  -Continued aspirin 81 mg  -Continued pravastatin 20 mg for now. Patient appears to have tried simvastatin, atorvastatin, and rosuvastatin in the past. Will address possibility of intensifying therapy at next encounter.   Written patient instructions provided. Total time in face to face counseling 30 minutes.   Follow up Pharmacist Clinic Visit 06/23/18.    Patient seen with: Marylene Buerger, PharmD Candidate [place] [employer] Class of 2021  Benard Halsted, PharmD, [place] [phone]

## 2018-06-18 NOTE — Patient Instructions (Signed)
Thank you for coming to see [place] today.   Continue to take 33 units of Lantus at night.   Continue to take 10 units f Humalog before your 2 largest meals of the day.   Bring in your meter for review next week.

## 2018-06-23 ENCOUNTER — Ambulatory Visit: Payer: Self-pay | Attending: Family Medicine | Admitting: Pharmacist

## 2018-06-23 ENCOUNTER — Encounter: Payer: Self-pay | Admitting: Pharmacist

## 2018-06-23 DIAGNOSIS — Z9111 Patient's noncompliance with dietary regimen: Secondary | ICD-10-CM | POA: Insufficient documentation

## 2018-06-23 DIAGNOSIS — Z8673 Personal history of transient ischemic attack (TIA), and cerebral infarction without residual deficits: Secondary | ICD-10-CM | POA: Insufficient documentation

## 2018-06-23 DIAGNOSIS — F17200 Nicotine dependence, unspecified, uncomplicated: Secondary | ICD-10-CM | POA: Insufficient documentation

## 2018-06-23 DIAGNOSIS — E1165 Type 2 diabetes mellitus with hyperglycemia: Secondary | ICD-10-CM

## 2018-06-23 DIAGNOSIS — I2510 Atherosclerotic heart disease of native coronary artery without angina pectoris: Secondary | ICD-10-CM | POA: Insufficient documentation

## 2018-06-23 DIAGNOSIS — E119 Type 2 diabetes mellitus without complications: Secondary | ICD-10-CM | POA: Insufficient documentation

## 2018-06-23 LAB — GLUCOSE, POCT (MANUAL RESULT ENTRY): POC Glucose: 297 mg/dl — AB (ref 70–99)

## 2018-06-23 MED ORDER — INSULIN LISPRO 100 UNIT/ML (KWIKPEN): PEN_INJECTOR | SUBCUTANEOUS | 2 refills | Status: DC

## 2018-06-23 MED ORDER — INSULIN GLARGINE 100 UNIT/ML SOLOSTAR PEN: 42.0000 [IU] | PEN_INJECTOR | Freq: Every day | SUBCUTANEOUS | 2 refills | Status: DC

## 2018-06-23 NOTE — Progress Notes (Signed)
    S:    PCP: Dr. Chapman Fitch  No chief complaint on file.  Patient arrives in good spirits. Presents for diabetes evaluation, education, and management at the request of Dr. Chapman Fitch. Patient was referred on and last seen by Dr. Chapman Fitch 05/21/18. Pt was last seen by me 06/18/18. I updated her insulin prescriptions to reflect what she's actually taking.  Family/Social History:  - FH: DM (brother) - Tobacco: current every day smoker (1 pack/day) - Alcohol: denies   Human resources officer affordability:  - Patient is uninsured - Receives medication via patient assistance through our pharmacy  Patient denies adherence with medications.  Current diabetes medications include:  - Lantus 33 units daily - Humalog 10 units BID  - Misses several doses  Patient denies hypoglycemic events.   Diet: - Does not limit carbs - Extremely non-compliant with diabetic diet; "I'm not giving up what I've always had"  Patient-reported exercise habits:  - Patient denies exercise   Patient reports baseline nocturia.  Patient denies neuropathy. Patient denies visual changes. Patient denies self foot exams.   O:  POCT: 297. Has eaten lasagna and garlic bread Home sugars:  - patient brings in glucometer - Reveals 200-300s fasting  Lab Results  Component Value Date   HGBA1C 14.6 (A) 05/21/2018   There were no vitals filed for this visit.  Lipid Panel     Component Value Date/Time   CHOL 330 (H) 07/02/2016 1105   TRIG 516 (H) 07/02/2016 1105   HDL 31 (L) 07/02/2016 1105   CHOLHDL 10.6 (H) 07/02/2016 1105   VLDL NOT CALC 07/02/2016 1105   LDLCALC NOT CALC 07/02/2016 1105    Clinical ASCVD: Yes  - history of stroke  A/P: Diabetes longstanding currently uncontrolled based on A1c of 14.9. Patient is able to verbalize appropriate hypoglycemia management plan. Patient is not adherent with medications. Control is suboptimal d/t dietary indiscretion, physical inactivity, and medication  non-compliance.  We had a lengthy discussion concerning diabetes and impact on health. Explained morbidity/mortality associated with poorly controlled diabetes. She insists that she will continue to work on her diet and adherence to medication.  -Increased Lantus (insulin glargine) to 42 units daily.  -Continued Humalog (insulin lispro) 10 units before 2 largest meals  -Extensively discussed pathophysiology of DM, recommended lifestyle interventions, dietary effects on glycemic control -Counseled on s/sx of and management of hypoglycemia -Next A1C anticipated 11/19.   ASCVD risk - secondary prevention in patient with DM and previous stroke. Last LDL is not controlled. High intensity statin indicated. Aspirin is indicated.  -Continued aspirin 81 mg  -Continued pravastatin 20 mg for now. Patient appears to have tried simvastatin, atorvastatin, and rosuvastatin in the past. She is not amenable to intensifying statin therapy at this time.   Written patient instructions provided. Total time in face to face counseling 30 minutes.   Follow up Pharmacist Clinic Visit 07/23/18.    Patient seen with: Marylene Buerger, PharmD Candidate [place] [employer] Class of 2021  Benard Halsted, PharmD, [place] [phone]

## 2018-06-23 NOTE — Patient Instructions (Addendum)
Thank you for coming to see me today. Please do the following:  1. Increase Lantus to 42 units before bedtime. 2. Continue 10 units of Humalog BID. 3. Continue checking blood sugars at home. 4. Continue making the lifestyle changes we've discussed together during our visit. Diet and exercise play a significant role in improving your blood sugars.  5. Follow-up with me in 1 month.    Hypoglycemia or low blood sugar:   Low blood sugar can happen quickly and may become an emergency if not treated right away.   While this shouldn't happen often, it can be brought upon if you skip a meal or do not eat enough. Also, if your insulin or other diabetes medications are dosed too high, this can cause your blood sugar to go to low.   Warning signs of low blood sugar include: 1. Feeling shaky or dizzy 2. Feeling weak or tired  3. Excessive hunger 4. Feeling anxious or upset  5. Sweating even when you aren't exercising  What to do if I experience low blood sugar? 1. Check your blood sugar with your meter. If lower than 70, proceed to step 2.  2. Treat with 3-4 glucose tablets or 3 packets of regular sugar. If these aren't around, you can try hard candy. Yet another option would be to drink 4 ounces of fruit juice or 6 ounces of REGULAR soda.  3. Re-check your sugar in 15 minutes. If it is still below 70, do what you did in step 2 again. If has come back up, go ahead and eat a snack or small meal at this time.

## 2018-06-29 MED FILL — !LANTUS SOLOSTAR 100UNITS/M: 100 | 28 days supply | Qty: 12 | Fill #0

## 2018-06-30 ENCOUNTER — Other Ambulatory Visit: Payer: Self-pay | Admitting: Pharmacist

## 2018-06-30 MED ORDER — INSULIN ASPART 100 UNIT/ML FLEXPEN: PEN_INJECTOR | SUBCUTANEOUS | 2 refills | Status: DC

## 2018-06-30 MED FILL — $novoLOG FLEXPEN SYRINGE: 100 | 30 days supply | Qty: 6 | Fill #0

## 2018-07-02 ENCOUNTER — Ambulatory Visit: Payer: Medicaid Other

## 2018-07-12 ENCOUNTER — Encounter: Payer: Medicaid Other | Admitting: Family Medicine

## 2018-07-12 MED FILL — QUETIAPINE FUMARATE 400 MG: 400 | 30 days supply | Qty: 60 | Fill #2

## 2018-07-14 ENCOUNTER — Encounter (HOSPITAL_COMMUNITY): Payer: Self-pay | Admitting: Emergency Medicine

## 2018-07-14 ENCOUNTER — Ambulatory Visit (HOSPITAL_COMMUNITY)
Admission: EM | Admit: 2018-07-14 | Discharge: 2018-07-14 | Disposition: A | Discharge status: Home / Self Care | Payer: Medicaid Other | Attending: Family Medicine | Admitting: Family Medicine

## 2018-07-14 DIAGNOSIS — L0291 Cutaneous abscess, unspecified: Secondary | ICD-10-CM

## 2018-07-14 DIAGNOSIS — L02413 Cutaneous abscess of right upper limb: Secondary | ICD-10-CM

## 2018-07-14 MED ORDER — TRAMADOL HCL 50 MG PO TABS: 50.0000 mg | ORAL_TABLET | Freq: Four times a day (QID) | ORAL | 0 refills | Status: DC | PRN

## 2018-07-14 MED ORDER — SULFAMETHOXAZOLE-TRIMETHOPRIM 800-160 MG PO TABS: 1.0000 | ORAL_TABLET | Freq: Two times a day (BID) | ORAL | 0 refills | Status: AC

## 2018-07-14 NOTE — ED Triage Notes (Signed)
Pt states a week ago she burnt her R forearm on a pot, stsates its blistered up really bad. Pt is diabetic.

## 2018-07-19 NOTE — ED Provider Notes (Signed)
[place]   [id number] 07/14/18 Arrival Time: 4580  ASSESSMENT & PLAN:  1. Abscess     Incision and Drainage Procedure Note  Anesthesia: 1% plain lidocaine  Procedure Details  The procedure, risks and complications have been discussed in detail (including, but not limited to pain and bleeding) with the patient.  The skin induration was prepped and draped in the usual fashion. After adequate local anesthesia, I&D with a #11 blade was performed on the right forearm induration. Purulent drainage: present; small amount.  EBL: minimal  Drains: none  Condition: Tolerated procedure well  Complications: none.  Meds ordered this encounter  Medications  . sulfamethoxazole-trimethoprim (BACTRIM DS,SEPTRA DS) 800-160 MG tablet    Sig: Take 1 tablet by mouth 2 (two) times daily for 10 days.    Dispense:  20 tablet    Refill:  0  . traMADol (ULTRAM) 50 MG tablet    Sig: Take 1 tablet (50 mg total) by mouth every 6 (six) hours as needed.    Dispense:  10 tablet    Refill:  0   Wound care and bandaging instructions given. To return in 48 hours for wound check if needed.  Finish all antibiotics. OTC analgesics as needed in addition to tramadol prn. Medication sedation precautions.  Reviewed expectations re: course of current medical issues. Questions answered. Outlined signs and symptoms indicating need for more acute intervention. Patient verbalized understanding. After Visit Summary given.   SUBJECTIVE:  Ashley [name] is a 57 y.o. female who presents with a possible abscess of her R forearm. Reports burning this area about one week ago. Now with a small induration that is tender and without drainage and bleeding. Questions slight increase in size over the past couple of days. Afebrile. Is tender. OTC ibuprofen without much help. Feels Td is UTD. No extremity sensation changes or weakness. No specific aggravating or alleviating factors reported.  ROS: As per  HPI.  OBJECTIVE:  Vitals:   07/14/18 1541  BP: 137/88  Pulse: 81  Resp: 18  Temp: 99.1 F (37.3 C)  TempSrc: Oral  SpO2: 97%     General appearance: alert; no distress Skin: 1 cm induration of her R distal forearm; tender to touch; no active drainage RUE with normal distal sensation and brisk capillary refill of fingers Psychological: alert and cooperative; normal mood and affect  No Known Allergies  Past Medical History:  Diagnosis Date  . Anxiety   . Arthritis   . Bipolar 1 disorder ([place])   . Depression   . Diabetes mellitus without complication ([place])    Type II  . GERD (gastroesophageal reflux disease)   . Heart murmur    "nothing to worry about"  . HTN (hypertension)    not on medication   Social History   Socioeconomic History  . Marital status: Divorced    Spouse name: Not on file  . Number of children: Not on file  . Years of education: Not on file  . Highest education level: Not on file  Occupational History  . Not on file  Social Needs  . Financial resource strain: Not on file  . Food insecurity:    Worry: Not on file    Inability: Not on file  . Transportation needs:    Medical: Not on file    Non-medical: Not on file  Tobacco Use  . Smoking status: Current Every Day Smoker    Packs/day: 1.00    Years: 39.00    Pack  years: 39.00    Types: Cigarettes  . Smokeless tobacco: Never Used  . Tobacco comment: pt states that she is using the E cigs and is trying to quit  Substance and Sexual Activity  . Alcohol use: No  . Drug use: Yes    Types: Cocaine    Comment: 04/06/16- last time  . Sexual activity: Yes    Birth control/protection: Surgical  Lifestyle  . Physical activity:    Days per week: Not on file    Minutes per session: Not on file  . Stress: Not on file  Relationships  . Social connections:    Talks on phone: Not on file    Gets together: Not on file    Attends religious service: Not on file    Active member of club or  organization: Not on file    Attends meetings of clubs or organizations: Not on file    Relationship status: Not on file  Other Topics Concern  . Not on file  Social History Narrative   Lives alone in an apartment.  Drives.  Does not work, disabled due to bipolar.    Family History  Problem Relation Age of Onset  . Diabetes Mellitus II Father   . Diabetes Brother   . Heart disease Neg Hx   . Stroke Neg Hx   . Kidney disease Neg Hx    Past Surgical History:  Procedure Laterality Date  . ABDOMINAL HYSTERECTOMY    . LUMBAR LAMINECTOMY/DECOMPRESSION MICRODISCECTOMY Left 04/07/2016   Procedure: Left Lumbar four-five Microdiskectomy;  Surgeon: Erline Levine, MD;  Location: [facility];  Service: Neurosurgery;  Laterality: Left;           Vanessa Kick, MD 07/19/18 1034

## 2018-07-23 ENCOUNTER — Ambulatory Visit: Payer: Self-pay | Attending: Family Medicine | Admitting: Pharmacist

## 2018-07-23 ENCOUNTER — Encounter: Payer: Self-pay | Admitting: Pharmacist

## 2018-07-23 DIAGNOSIS — E1165 Type 2 diabetes mellitus with hyperglycemia: Secondary | ICD-10-CM

## 2018-07-23 LAB — GLUCOSE, POCT (MANUAL RESULT ENTRY): POC Glucose: 138 mg/dL — AB (ref 70–99)

## 2018-07-23 NOTE — Patient Instructions (Addendum)
Thank you for coming to see me today. Please do the following:  1. Increase Lantus to 35 units in the morning and in the evening.   2. Novolog 10 units 2 times daily before lunch and dinner.  3. Continue checking blood sugars at home.   4. Continue making the lifestyle changes we've discussed together during our visit. Diet and exercise play a significant role in improving your blood sugars.  5. Follow-up with PCP 08/17/18   Hypoglycemia or low blood sugar:   Low blood sugar can happen quickly and may become an emergency if not treated right away.   While this shouldn't happen often, it can be brought upon if you skip a meal or do not eat enough. Also, if your insulin or other diabetes medications are dosed too high, this can cause your blood sugar to go to low.   Warning signs of low blood sugar include: 1. Feeling shaky or dizzy 2. Feeling weak or tired  3. Excessive hunger 4. Feeling anxious or upset  5. Sweating even when you aren't exercising  What to do if I experience low blood sugar? 1. Check your blood sugar with your meter. If lower than 70, proceed to step 2.  2. Treat with 3-4 glucose tablets or 3 packets of regular sugar. If these aren't around, you can try hard candy. Yet another option would be to drink 4 ounces of fruit juice or 6 ounces of REGULAR soda.  3. Re-check your sugar in 15 minutes. If it is still below 70, do what you did in step 2 again. If has come back up, go ahead and eat a snack or small meal at this time.

## 2018-07-23 NOTE — Progress Notes (Signed)
    S:    PCP: Dr. Chapman Fitch  No chief complaint on file.  Patient arrives in good spirits. Presents for diabetes management at the request of Dr. Chapman Fitch. Patient was referred on and last seen by Dr. Chapman Fitch 05/21/18. Pt was last seen by me 06/23/18. Changed Lantus to 42 units daily.   Family/Social History:  - FH: DM (brother) - Tobacco: current every day smoker (1 pack/day) - Alcohol: denies   Human resources officer affordability:  - Patient is uninsured - Receives medication via patient assistance through our pharmacy  Patient denies adherence with medications.  Current diabetes medications include:  - Lantus 42 units daily. Reports "experimenting" with 32 units BID. - Novolog 10 units BID   Previously tried anti-hyperglycemic medications: - Invokana - Farxiga - Glipizide, glimepiride - Metformin  Patient denies hypoglycemic events.   Diet: - Pt reports substituting  with whole wheat bread, brown rice  Patient-reported exercise habits:  - Patient denies exercise   Patient reports baseline nocturia.  Patient denies neuropathy. Patient reports visual changes. Patient reports self foot exams.   O:  POCT: 138 Home sugars:  - patient brings in glucometer - Reveals 130s- 140s fasting - checking 1 hour after eating 200-240  Lab Results  Component Value Date   HGBA1C 14.6 (A) 05/21/2018   There were no vitals filed for this visit.  Lipid Panel     Component Value Date/Time   CHOL 330 (H) 07/02/2016 1105   TRIG 516 (H) 07/02/2016 1105   HDL 31 (L) 07/02/2016 1105   CHOLHDL 10.6 (H) 07/02/2016 1105   VLDL NOT CALC 07/02/2016 1105   LDLCALC NOT CALC 07/02/2016 1105    Clinical ASCVD: Yes  - history of stroke  A/P: Diabetes longstanding currently uncontrolled based on A1c of 14.9. Patient is able to verbalize appropriate hypoglycemia management plan. Patient is not adherent with medications. Control is suboptimal d/t dietary indiscretion, physical inactivity,  and medication non-compliance.  Pt demonstrated administration of insulin. She is adament that she is taking Lantus 32 units BID. I have increased this to 35 units BID. Novolog will be continued for now.   -Increased Lantus (insulin glargine) to 35 units BID -Continued Humalog (insulin lispro) 10 units before 2 largest meals  -Extensively discussed pathophysiology of DM, recommended lifestyle interventions, dietary effects on glycemic control -Counseled on s/sx of and management of hypoglycemia -Next A1C anticipated 11/19.   ASCVD risk - secondary prevention in patient with DM and previous stroke. Last LDL is not controlled. High intensity statin indicated. Aspirin is indicated.  -Continued aspirin 81 mg  -Continued pravastatin 20 mg for now. Patient appears to have tried simvastatin, atorvastatin, and rosuvastatin in the past. She is not amenable to intensifying statin therapy at this time.   Written patient instructions provided. Total time in face to face counseling 30 minutes.   Follow up PCP visit 08/17/18.  Patient seen with: Dixon Boos, PharmD Candidate [employer]  Benard Halsted, PharmD, [place] [phone]

## 2018-08-03 ENCOUNTER — Ambulatory Visit: Payer: Medicaid Other

## 2018-08-04 ENCOUNTER — Ambulatory Visit
Admission: RE | Admit: 2018-08-04 | Discharge: 2018-08-04 | Disposition: A | Discharge status: Home / Self Care | Payer: Medicaid Other | Source: Ambulatory Visit | Attending: Physician Assistant | Admitting: Physician Assistant

## 2018-08-04 DIAGNOSIS — Z1231 Encounter for screening mammogram for malignant neoplasm of breast: Secondary | ICD-10-CM

## 2018-08-05 ENCOUNTER — Other Ambulatory Visit: Payer: Self-pay | Admitting: Family Medicine

## 2018-08-05 MED FILL — $novoLOG FLEXPEN SYRINGE: 100 | 30 days supply | Qty: 6 | Fill #1

## 2018-08-05 MED FILL — QUETIAPINE FUMARATE 400 MG: 400 | 30 days supply | Qty: 60 | Fill #3

## 2018-08-05 MED FILL — !LANTUS SOLOSTAR 100UNITS/M: 100 | 28 days supply | Qty: 12 | Fill #1

## 2018-08-17 ENCOUNTER — Encounter: Payer: Medicaid Other | Admitting: Family Medicine

## 2018-09-01 ENCOUNTER — Other Ambulatory Visit: Payer: Self-pay | Admitting: Family Medicine

## 2018-09-01 DIAGNOSIS — F3170 Bipolar disorder, currently in remission, most recent episode unspecified: Secondary | ICD-10-CM

## 2018-09-01 MED FILL — QUETIAPINE FUMARATE 400 MG: 400 | 30 days supply | Qty: 60 | Fill #0

## 2018-09-20 ENCOUNTER — Ambulatory Visit: Payer: Self-pay | Attending: Family Medicine | Admitting: Family Medicine

## 2018-09-20 ENCOUNTER — Encounter: Payer: Self-pay | Admitting: Family Medicine

## 2018-09-20 VITALS — BP 100/69 | HR 90 | Temp 98.2°F | Resp 18 | Ht 67.0 in | Wt 160.0 lb

## 2018-09-20 DIAGNOSIS — Z79899 Other long term (current) drug therapy: Secondary | ICD-10-CM

## 2018-09-20 DIAGNOSIS — M51369 Other intervertebral disc degeneration, lumbar region without mention of lumbar back pain or lower extremity pain: Secondary | ICD-10-CM

## 2018-09-20 DIAGNOSIS — Z794 Long term (current) use of insulin: Secondary | ICD-10-CM | POA: Insufficient documentation

## 2018-09-20 DIAGNOSIS — M5136 Other intervertebral disc degeneration, lumbar region: Secondary | ICD-10-CM

## 2018-09-20 DIAGNOSIS — E1165 Type 2 diabetes mellitus with hyperglycemia: Secondary | ICD-10-CM

## 2018-09-20 DIAGNOSIS — G8929 Other chronic pain: Secondary | ICD-10-CM

## 2018-09-20 DIAGNOSIS — F3170 Bipolar disorder, currently in remission, most recent episode unspecified: Secondary | ICD-10-CM

## 2018-09-20 DIAGNOSIS — Z90710 Acquired absence of both cervix and uterus: Secondary | ICD-10-CM | POA: Insufficient documentation

## 2018-09-20 DIAGNOSIS — M545 Low back pain, unspecified: Secondary | ICD-10-CM

## 2018-09-20 DIAGNOSIS — F17210 Nicotine dependence, cigarettes, uncomplicated: Secondary | ICD-10-CM | POA: Insufficient documentation

## 2018-09-20 DIAGNOSIS — J209 Acute bronchitis, unspecified: Secondary | ICD-10-CM

## 2018-09-20 DIAGNOSIS — M549 Dorsalgia, unspecified: Secondary | ICD-10-CM | POA: Insufficient documentation

## 2018-09-20 DIAGNOSIS — I10 Essential (primary) hypertension: Secondary | ICD-10-CM

## 2018-09-20 DIAGNOSIS — Z833 Family history of diabetes mellitus: Secondary | ICD-10-CM | POA: Insufficient documentation

## 2018-09-20 DIAGNOSIS — E782 Mixed hyperlipidemia: Secondary | ICD-10-CM

## 2018-09-20 LAB — POCT GLYCOSYLATED HEMOGLOBIN (HGB A1C): HbA1c, POC (controlled diabetic range): 10.2 % — AB (ref 0.0–7.0)

## 2018-09-20 LAB — GLUCOSE, POCT (MANUAL RESULT ENTRY): POC Glucose: 273 mg/dL — AB (ref 70–99)

## 2018-09-20 MED ORDER — DOXYCYCLINE HYCLATE 100 MG PO TABS: 100.0000 mg | ORAL_TABLET | Freq: Two times a day (BID) | ORAL | 0 refills | Status: DC

## 2018-09-20 MED ORDER — INSULIN PEN NEEDLE 32G X 4 MM MISC: 11 refills | Status: DC

## 2018-09-20 MED ORDER — INSULIN ASPART 100 UNIT/ML FLEXPEN: PEN_INJECTOR | SUBCUTANEOUS | 3 refills | Status: DC

## 2018-09-20 MED ORDER — INSULIN GLARGINE 100 UNIT/ML SOLOSTAR PEN: 48.0000 [IU] | PEN_INJECTOR | Freq: Every day | SUBCUTANEOUS | 4 refills | Status: DC

## 2018-09-20 MED ORDER — QUETIAPINE FUMARATE 400 MG PO TABS: ORAL_TABLET | ORAL | 3 refills | Status: DC

## 2018-09-20 MED ORDER — TRAMADOL HCL 50 MG PO TABS: 50.0000 mg | ORAL_TABLET | Freq: Two times a day (BID) | ORAL | 0 refills | Status: DC | PRN

## 2018-09-20 MED FILL — TRUEPLUS PEN NDL 32GX5/32: 32G X 4 MM | 30 days supply | Qty: 100 | Fill #0

## 2018-09-20 MED FILL — TRUEPLUS PEN NDL 32GX5/32": 32G X 4 MM | 30 days supply | Qty: 100 | Fill #0

## 2018-09-20 MED FILL — DOXYCYCLINE HYCLATE 100 MG: 100 | 10 days supply | Qty: 20 | Fill #0

## 2018-09-20 MED FILL — !LANTUS SOLOSTAR 100UNITS/M: 100 | 25 days supply | Qty: 12 | Fill #0

## 2018-09-20 MED FILL — traMADol HCL 50 MG TABS: 50 | 30 days supply | Qty: 60 | Fill #0

## 2018-09-20 MED FILL — $novoLOG FLEXPEN SYRINGE: 100 | 90 days supply | Qty: 18 | Fill #0

## 2018-09-20 NOTE — Progress Notes (Signed)
Subjective:    Patient ID: Ashley [name], female    DOB: Jul 29, 1961, 57 y.o.   MRN: [id number]  HPI       57 yo female who is seen in follow-up of uncontrolled diabetes, hypertension, chronic back pain due to lumbar degenerative disc disease, dyslipidemia and patient with history of bipolar disorder.  Patient reports that she needs refill of her medications.  Patient states that she does not wish to go to see a mental health professional regarding her bipolar disorder.  Patient feels that she is doing well at this time on her current medications.  Patient admits that she does not check her blood sugars regularly and she does not like sticking her fingers. Patient often takes short acting insulin once or twice per day before a meal and sometimes forgets to take her Lantus.  Patient does have issues with increased thirst and urinary frequency at times.  Patient also request prescription for tramadol at today's visit to take as needed for chronic back pain.  Patient states that her back pain is across her lower back and is sharp and stabbing.  Patient does not feel as if she has any radiation of pain into her buttocks or lower legs.      Patient reports that she is taking her blood pressure medication daily.  Patient denies any headaches or dizziness related to her blood pressure.  Patient takes her cholesterol medication when she remembers to do so.  Patient reports that she has had recent increase in recurrent cough with mostly whitish mucus with coughing.  Patient denies any fever or chills.  Patient has had some fatigue.  Patient continues to smoke and is no longer using nicotine patches.  Patient does not feel as if she can stop smoking at this time.  Past Medical History:  Diagnosis Date  . Anxiety   . Arthritis   . Bipolar 1 disorder ([place])   . Depression   . Diabetes mellitus without complication ([place])    Type II  . GERD (gastroesophageal reflux disease)   . Heart murmur    "nothing to  worry about"  . HTN (hypertension)    not on medication   Past Surgical History:  Procedure Laterality Date  . ABDOMINAL HYSTERECTOMY    . LUMBAR LAMINECTOMY/DECOMPRESSION MICRODISCECTOMY Left 04/07/2016   Procedure: Left Lumbar four-five Microdiskectomy;  Surgeon: Erline Levine, MD;  Location: [facility];  Service: Neurosurgery;  Laterality: Left;   Social History   Tobacco Use  . Smoking status: Current Every Day Smoker    Packs/day: 1.00    Years: 39.00    Pack years: 39.00    Types: Cigarettes  . Smokeless tobacco: Never Used  . Tobacco comment: pt states that she is using the E cigs and is trying to quit  Substance Use Topics  . Alcohol use: No  . Drug use: Yes    Types: Cocaine    Comment: 04/06/16- last time   Family History  Problem Relation Age of Onset  . Diabetes Mellitus II Father   . Diabetes Brother   . Heart disease Neg Hx   . Stroke Neg Hx   . Kidney disease Neg Hx   No Known Allergies   Review of Systems  Constitutional: Positive for fatigue. Negative for chills and fever.  HENT: Positive for congestion and postnasal drip. Negative for sinus pressure, sinus pain, sore throat and trouble swallowing.   Respiratory: Positive for cough. Negative for shortness of breath.  Cardiovascular: Negative for chest pain, palpitations and leg swelling.  Gastrointestinal: Negative for abdominal pain, constipation and diarrhea.  Endocrine: Positive for polydipsia and polyuria. Negative for polyphagia.  Genitourinary: Positive for frequency (Occasional when blood sugars are high). Negative for dysuria.  Musculoskeletal: Positive for back pain. Negative for gait problem.  Neurological: Positive for headaches (Occasional recent headaches after recurrent cough).  Hematological: Negative for adenopathy. Does not bruise/bleed easily.  Psychiatric/Behavioral: Negative for self-injury and suicidal ideas.       Objective:   Physical Exam BP 100/69 (BP Location: Right Arm,  Patient Position: Sitting, Cuff Size: Normal)   Pulse 90   Temp 98.2 F (36.8 C) (Oral)   Resp 18   Ht 5\' 7"  (1.702 m)   Wt 160 lb (72.6 kg)   SpO2 100%   BMI 25.06 kg/m Nurse's notes and vital signs reviewed General-well-nourished, well-developed older female in no acute distress ENT-TMs dull, nares with edema/erythema of the nasal turbinates-moderate with mild clear nasal discharge, patient with mild posterior pharynx erythema Neck-supple, no lymphadenopathy Lungs-patient with mild scattered coarse breath sounds, no wheezing, no increased work of breathing Cardiovascular-regular rate and rhythm Abdomen-soft, nontender Back-no CVA tenderness, patient with lumbosacral discomfort to palpation Extremities-no edema Psych- patient with a slightly odd affect but exhibits otherwise normal mood Patient declined diabetic foot exam as this was done at her last visit      Assessment & Plan:  1. Uncontrolled type 2 diabetes mellitus with hyperglycemia ([place]) Importance of having regular meals throughout the day as well as use of her pre-meal insulin discussed with the patient.  Discussed the importance of monitoring her blood sugars on a regular basis as this will help her with the control of her blood sugars and help her know which foods tend to raise her blood sugars as well as to help with titration of insulin to help better control her blood sugars.  Patient will have A1c, lipid panel, CMP and urine microalbumin at today's visit - HgB A1c - Glucose (CBG) - Lipid panel - Comprehensive metabolic panel - Microalbumin/Creatinine Ratio, Urine - insulin aspart (NOVOLOG FLEXPEN) 100 UNIT/ML FlexPen; Inject 10 units before your 2 largest meals of the day.  Dispense: 6 mL; Refill: 3 - Insulin Glargine (LANTUS SOLOSTAR) 100 UNIT/ML Solostar Pen; Inject 48 Units into the skin at bedtime.  Dispense: 12 mL; Refill: 4 - Insulin Pen Needle (TRUEPLUS PEN NEEDLES) 32G X 4 MM MISC; Use to inject insulin.   Dispense: 100 each; Refill: 11  2. Essential hypertension Patient reports that she is taking her amlodipine 10 mg daily.  Patient's blood pressures controlled at today's visit.  Patient will have lipid panel and microalbumin/creatinine ratio - Lipid panel - Microalbumin/Creatinine Ratio, Urine  3. Mixed dyslipidemia Patient will have lipid panel at today's visit.  Patient is encouraged to remain compliant with pravastatin as well as a low-fat diet and regular exercise.  Patient will also have CMP to check electrolytes/liver function test - Lipid panel - Comprehensive metabolic panel  4. Lumbar degenerative disc disease Patient with lumbar degenerative disc disease and patient is given refill of tramadol but also encouraged to consider referral to physical therapy - traMADol (ULTRAM) 50 MG tablet; Take 1 tablet (50 mg total) by mouth every 12 (twelve) hours as needed.  Dispense: 60 tablet; Refill: 0  5. Bipolar affective disorder in remission [facility]) Referral to establish with mental health services was suggested but patient does not wish to do so.  Patient was provided with  a refill of Seroquel and patient was asked to return to clinic in 3 to 4 weeks for reevaluation - QUEtiapine (SEROQUEL) 400 MG tablet; TAKE 2 TABLETS BY MOUTH AT BEDTIME.  Dispense: 60 tablet; Refill: 3  6. Chronic bilateral low back pain without sciatica Prescription provided for tramadol per patient's request to take as needed for chronic low back pain but patient was also asked to consider physical therapy to help with her back pain issues - traMADol (ULTRAM) 50 MG tablet; Take 1 tablet (50 mg total) by mouth every 12 (twelve) hours as needed.  Dispense: 60 tablet; Refill: 0  7. Encounter for long-term (current) use of medications Patient will have lipid panel and CMP in follow-up of chronic medical issues - Lipid panel - Comprehensive metabolic panel  8. Acute bronchitis, unspecified organism Patient with acute  bronchitis on exam and patient is encouraged to take over-the-counter Mucinex or Robitussin-DM to help with chest congestion.  Prescription provided for doxycycline and patient is encouraged to resume efforts at smoking cessation - doxycycline (VIBRA-TABS) 100 MG tablet; Take 1 tablet (100 mg total) by mouth 2 (two) times daily.  Dispense: 20 tablet; Refill: 0  An After Visit Summary was printed and given to the patient.  Return in about 4 months (around 01/20/2019) for Diabetes.

## 2018-09-21 LAB — COMPREHENSIVE METABOLIC PANEL WITH GFR
ALT: 14 IU/L (ref 0–32)
AST: 14 IU/L (ref 0–40)
Albumin/Globulin Ratio: 1.8 (ref 1.2–2.2)
Albumin: 4.6 g/dL (ref 3.5–5.5)
Alkaline Phosphatase: 128 IU/L — ABNORMAL HIGH (ref 39–117)
BUN/Creatinine Ratio: 15 (ref 9–23)
BUN: 16 mg/dL (ref 6–24)
Bilirubin Total: <0.2 mg/dL (ref 0.0–1.2)
CO2: 16 mmol/L — ABNORMAL LOW (ref 20–29)
Calcium: 10.1 mg/dL (ref 8.7–10.2)
Chloride: 103 mmol/L (ref 96–106)
Creatinine, Ser: 1.05 mg/dL — ABNORMAL HIGH (ref 0.57–1.00)
GFR calc Af Amer: 67 mL/min/1.73
GFR calc non Af Amer: 58 mL/min/1.73 — ABNORMAL LOW
Globulin, Total: 2.6 g/dL (ref 1.5–4.5)
Glucose: 111 mg/dL — ABNORMAL HIGH (ref 65–99)
Potassium: 4 mmol/L (ref 3.5–5.2)
Sodium: 140 mmol/L (ref 134–144)
Total Protein: 7.2 g/dL (ref 6.0–8.5)

## 2018-09-21 LAB — MICROALBUMIN / CREATININE URINE RATIO
Creatinine, Urine: 286.6 mg/dL
Microalb/Creat Ratio: 8.3 mg/g{creat} (ref 0.0–30.0)
Microalbumin, Urine: 23.9 ug/mL

## 2018-09-21 LAB — LIPID PANEL
Chol/HDL Ratio: 8.7 ratio — ABNORMAL HIGH (ref 0.0–4.4)
Cholesterol, Total: 339 mg/dL — ABNORMAL HIGH (ref 100–199)
HDL: 39 mg/dL — ABNORMAL LOW
Triglycerides: 456 mg/dL — ABNORMAL HIGH (ref 0–149)

## 2018-10-01 MED FILL — QUETIAPINE FUMARATE 400 MG: 400 | 30 days supply | Qty: 60 | Fill #0

## 2018-10-02 ENCOUNTER — Other Ambulatory Visit: Payer: Self-pay | Admitting: Family Medicine

## 2018-10-02 DIAGNOSIS — E782 Mixed hyperlipidemia: Principal | ICD-10-CM

## 2018-10-02 DIAGNOSIS — E1169 Type 2 diabetes mellitus with other specified complication: Secondary | ICD-10-CM

## 2018-10-02 MED ORDER — FENOFIBRATE 48 MG PO TABS: 48.0000 mg | ORAL_TABLET | Freq: Every day | ORAL | 6 refills | Status: DC

## 2018-10-02 NOTE — Progress Notes (Signed)
Patient ID: Ashley [name], female   DOB: August 13, 1961, 57 y.o.   MRN: [id number]   Patient with recent lipid panel with total cholesterol of 339, TG of 456 and HDL of 39. LDL cannot be calculated due to the elevated TG level. Patient will be notified of the results and a RX will be sent in for patient to take fenofibrate to lower her TG and will recheck lipid panel when she has her appointment in April.

## 2018-10-04 ENCOUNTER — Telehealth: Payer: Self-pay | Admitting: *Deleted

## 2018-10-04 MED FILL — FENOFIBRATE 48 MG TABLET: 48 | 30 days supply | Qty: 30 | Fill #0

## 2018-10-04 NOTE — Telephone Encounter (Signed)
-----   Message from Antony Blackbird, MD sent at 10/02/2018 11:16 PM EST ----- Notify patient that her lipid panel was abnormal with TG elevated at 456 which is too high for the LDL to be calculated. I would like for her to start a medication to lower her triglycerides, fenofibrate, and the RX will be sent into her pharmacy. Glucose was 111 and a mild increase in creatinine at 1.05 (normal 0.57-1.00) and a mild increase in alkaline phosphatase at 128 (normal 39-117). Urine microalbumin/creatinine ratio normal

## 2018-10-04 NOTE — Telephone Encounter (Signed)
Patient verified DOB Patient is aware of needing to start fenofibrate for elevated cholesterol. Patient is also advised to take DM medication as prescribed to control her DM to help protect her kidneys. No further questions. Patient will pick up the latter part of the week.

## 2018-10-20 ENCOUNTER — Other Ambulatory Visit: Payer: Self-pay | Admitting: Family Medicine

## 2018-10-20 DIAGNOSIS — G8929 Other chronic pain: Secondary | ICD-10-CM

## 2018-10-20 DIAGNOSIS — M5136 Other intervertebral disc degeneration, lumbar region: Secondary | ICD-10-CM

## 2018-10-20 DIAGNOSIS — M545 Low back pain: Secondary | ICD-10-CM

## 2018-10-20 NOTE — Telephone Encounter (Signed)
Will route to PCP. Last script written and filled through our pharmacy 09/20/18 for a 30-day supply.

## 2018-10-20 NOTE — Telephone Encounter (Signed)
Will need appointment prior to future refill of Tramadol as I would also like patient to attend physical therapy to help with her back pain

## 2018-10-21 MED FILL — traMADol HCL 50 MG TABS: 50 | 30 days supply | Qty: 60 | Fill #0

## 2018-10-26 MED FILL — $LANTUS SOLOSTAR 100 UNITS/: 100 | 93 days supply | Qty: 45 | Fill #1

## 2018-10-26 MED FILL — FENOFIBRATE 48 MG TABLET: 48 | 30 days supply | Qty: 30 | Fill #0

## 2018-10-26 MED FILL — QUETIAPINE FUMARATE 400 MG: 400 | 30 days supply | Qty: 60 | Fill #1

## 2018-11-22 MED FILL — QUETIAPINE FUMARATE 400 MG: 400 | 30 days supply | Qty: 60 | Fill #2

## 2018-11-23 ENCOUNTER — Other Ambulatory Visit: Payer: Self-pay | Admitting: Family Medicine

## 2018-11-23 DIAGNOSIS — M5136 Other intervertebral disc degeneration, lumbar region: Secondary | ICD-10-CM

## 2018-11-23 DIAGNOSIS — M545 Low back pain: Secondary | ICD-10-CM

## 2018-11-23 DIAGNOSIS — M51369 Other intervertebral disc degeneration, lumbar region without mention of lumbar back pain or lower extremity pain: Secondary | ICD-10-CM

## 2018-11-23 DIAGNOSIS — G8929 Other chronic pain: Secondary | ICD-10-CM

## 2018-11-25 NOTE — Telephone Encounter (Signed)
Pt last seen: 09/20/18 Next appt: 01/20/19 Last RX written on: 10/20/18 Date of original fill: 10/21/18 Date of refill(s): n/a  No other controlled substances were filled during this time, please refill if appropriate.

## 2018-12-02 ENCOUNTER — Telehealth: Payer: Self-pay | Admitting: Family Medicine

## 2018-12-02 NOTE — Telephone Encounter (Signed)
1) Medication(s) Requested (by name): tamadol  2) Pharmacy of Choice: [facility] 3) Special Requests: Would like enough meds until her next appt  Approved medications will be sent to the pharmacy, we will reach out if there is an issue.  Requests made after 3pm may not be addressed until the following business day!  If a patient is unsure of the name of the medication(s) please note and ask patient to call back when they are able to provide all info, do not send to responsible party until all information is available!

## 2018-12-07 NOTE — Telephone Encounter (Signed)
New RX was sent to [facility] on 11/25/18, it is filled and ready to be picked up.

## 2018-12-15 MED FILL — traMADol HCL 50 MG TABS: 50 | 30 days supply | Qty: 60 | Fill #0

## 2018-12-17 ENCOUNTER — Other Ambulatory Visit: Payer: Self-pay | Admitting: Family Medicine

## 2018-12-17 DIAGNOSIS — M545 Low back pain, unspecified: Secondary | ICD-10-CM

## 2018-12-17 DIAGNOSIS — M5136 Other intervertebral disc degeneration, lumbar region: Secondary | ICD-10-CM

## 2018-12-17 DIAGNOSIS — G8929 Other chronic pain: Secondary | ICD-10-CM

## 2018-12-17 MED FILL — QUETIAPINE FUMARATE 400 MG: 400 | 30 days supply | Qty: 60 | Fill #3

## 2019-01-03 ENCOUNTER — Ambulatory Visit: Payer: Medicaid Other | Admitting: Family Medicine

## 2019-01-13 ENCOUNTER — Other Ambulatory Visit: Payer: Self-pay | Admitting: Family Medicine

## 2019-01-13 DIAGNOSIS — F3170 Bipolar disorder, currently in remission, most recent episode unspecified: Secondary | ICD-10-CM

## 2019-01-13 MED FILL — QUETIAPINE FUMARATE 400 MG: 400 | 30 days supply | Qty: 60 | Fill #0

## 2019-01-18 MED FILL — QUETIAPINE FUMARATE 400 MG: 400 | 30 days supply | Qty: 60 | Fill #1

## 2019-01-20 ENCOUNTER — Ambulatory Visit: Payer: Medicare Other | Attending: Family Medicine | Admitting: Family Medicine

## 2019-01-20 ENCOUNTER — Encounter: Payer: Self-pay | Admitting: Family Medicine

## 2019-01-20 ENCOUNTER — Other Ambulatory Visit: Payer: Self-pay

## 2019-01-20 VITALS — BP 127/85 | HR 90 | Temp 98.5°F | Ht 67.0 in | Wt 169.8 lb

## 2019-01-20 DIAGNOSIS — Z91199 Patient's noncompliance with other medical treatment and regimen due to unspecified reason: Secondary | ICD-10-CM

## 2019-01-20 DIAGNOSIS — I739 Peripheral vascular disease, unspecified: Secondary | ICD-10-CM

## 2019-01-20 DIAGNOSIS — Z79899 Other long term (current) drug therapy: Secondary | ICD-10-CM

## 2019-01-20 DIAGNOSIS — M545 Low back pain, unspecified: Secondary | ICD-10-CM

## 2019-01-20 DIAGNOSIS — E1165 Type 2 diabetes mellitus with hyperglycemia: Secondary | ICD-10-CM

## 2019-01-20 DIAGNOSIS — F3177 Bipolar disorder, in partial remission, most recent episode mixed: Secondary | ICD-10-CM

## 2019-01-20 DIAGNOSIS — I10 Essential (primary) hypertension: Secondary | ICD-10-CM

## 2019-01-20 DIAGNOSIS — M51369 Other intervertebral disc degeneration, lumbar region without mention of lumbar back pain or lower extremity pain: Secondary | ICD-10-CM

## 2019-01-20 DIAGNOSIS — F17200 Nicotine dependence, unspecified, uncomplicated: Secondary | ICD-10-CM

## 2019-01-20 DIAGNOSIS — E1142 Type 2 diabetes mellitus with diabetic polyneuropathy: Secondary | ICD-10-CM

## 2019-01-20 DIAGNOSIS — G8929 Other chronic pain: Secondary | ICD-10-CM

## 2019-01-20 DIAGNOSIS — M5136 Other intervertebral disc degeneration, lumbar region: Secondary | ICD-10-CM

## 2019-01-20 DIAGNOSIS — E782 Mixed hyperlipidemia: Secondary | ICD-10-CM

## 2019-01-20 DIAGNOSIS — E1169 Type 2 diabetes mellitus with other specified complication: Secondary | ICD-10-CM

## 2019-01-20 DIAGNOSIS — Z9119 Patient's noncompliance with other medical treatment and regimen: Secondary | ICD-10-CM

## 2019-01-20 LAB — POCT GLYCOSYLATED HEMOGLOBIN (HGB A1C): Hemoglobin A1C: 9.7 % — AB (ref 4.0–5.6)

## 2019-01-20 LAB — GLUCOSE, POCT (MANUAL RESULT ENTRY): POC Glucose: 167 mg/dL — AB (ref 70–99)

## 2019-01-20 MED ORDER — INSULIN GLARGINE 100 UNIT/ML SOLOSTAR PEN: 48.0000 [IU] | PEN_INJECTOR | Freq: Every day | SUBCUTANEOUS | 4 refills | Status: DC

## 2019-01-20 MED ORDER — QUETIAPINE FUMARATE 400 MG PO TABS: 800.0000 mg | ORAL_TABLET | Freq: Every day | ORAL | 0 refills | Status: DC

## 2019-01-20 MED ORDER — TRAMADOL HCL 50 MG PO TABS: ORAL_TABLET | ORAL | 0 refills | Status: DC

## 2019-01-20 MED ORDER — INSULIN ASPART 100 UNIT/ML FLEXPEN: PEN_INJECTOR | SUBCUTANEOUS | 3 refills | Status: DC

## 2019-01-20 MED FILL — $novoLOG FLEXPEN SYRINGE: 100 | 90 days supply | Qty: 18 | Fill #0

## 2019-01-20 MED FILL — traMADol HCL 50 MG TABS: 50 | 30 days supply | Qty: 60 | Fill #0

## 2019-01-20 MED FILL — $LANTUS SOLOSTAR 100 UNITS/: 100 | 93 days supply | Qty: 45 | Fill #0

## 2019-01-20 NOTE — Progress Notes (Signed)
Subjective:    Patient ID: Ashley [name], female    DOB: [birth year], 58 y.o.   MRN: [id number]  HPI       58 yo female seen in follow-up of chronic medical issues including uncontrolled type 2 diabetes requiring the use of insulin for which patient has been non-complaint with medications. Patient initially stated that she takes 42 units of Lantus twice per day and 30 units of regular insulin once per day instead of 3 times daily before meals as instructed. Patient later stated that she sometimes takes insulin once per day and sometimes she does not take her insulin at all especially over the past few months as her daughter was living with her and causing patient to be anxious and aggravated and patient therefore did not take her insulin at all for awhile. She reports continued sensation of numbness along the outside of her left great toe.  Patient also requested refill of Seroquel and when asked if she had followed up with psychiatry as she had been asked to do after her last visit she stated that she was not going to do so because she was fine on her current Seroquel and no one was going to mess with her dose of Seroquel.       She reports continued issues with chronic low back pain. She feels that her back pain is always between a 7-8.  She has also noticed that she has increased pain in her legs, usually in the back of her calves when she is walking. Walking also increases her back pain as well as standing up for a while. Patient does have some fatigue. She denies any issues with SOB or cough. She continues to smoke and has no attention of quitting. She is no longer taking Symbicort. She is not taking her cholesterol medication at this time. She has had no headaches or dizziness and she does not think that her blood pressure has been high therefore she is not taking any blood pressure medication at this time.   Past Medical History:  Diagnosis Date  . Anxiety   . Arthritis   . Bipolar 1 disorder  ([place])   . Depression   . Diabetes mellitus without complication ([place])    Type II  . GERD (gastroesophageal reflux disease)   . Heart murmur    "nothing to worry about"  . HTN (hypertension)    not on medication   Past Surgical History:  Procedure Laterality Date  . ABDOMINAL HYSTERECTOMY    . LUMBAR LAMINECTOMY/DECOMPRESSION MICRODISCECTOMY Left 04/07/2016   Procedure: Left Lumbar four-five Microdiskectomy;  Surgeon: Erline Levine, MD;  Location: [facility];  Service: Neurosurgery;  Laterality: Left;   Family History  Problem Relation Age of Onset  . Diabetes Mellitus II Father   . Diabetes Brother   . Heart disease Neg Hx   . Stroke Neg Hx   . Kidney disease Neg Hx    Social History   Tobacco Use  . Smoking status: Current Every Day Smoker    Packs/day: 1.00    Years: 39.00    Pack years: 39.00    Types: Cigarettes  . Smokeless tobacco: Never Used  . Tobacco comment: pt states that she is using the E cigs and is trying to quit  Substance Use Topics  . Alcohol use: No  . Drug use: Yes    Types: Cocaine    Comment: 04/06/16- last time   No Known Allergies    Review  of Systems  Constitutional: Positive for fatigue. Negative for chills and fever.  HENT: Negative for congestion, sore throat and trouble swallowing.   Respiratory: Negative for cough and shortness of breath.   Cardiovascular: Negative for chest pain and palpitations.  Gastrointestinal: Negative for abdominal pain, blood in stool, constipation, diarrhea, nausea and vomiting.  Endocrine: Negative for polydipsia, polyphagia and polyuria.  Genitourinary: Negative for dysuria and frequency.  Musculoskeletal: Positive for back pain and gait problem.  Neurological: Positive for numbness. Negative for dizziness and headaches.  Hematological: Negative for adenopathy. Does not bruise/bleed easily.  Psychiatric/Behavioral: Negative for agitation. The patient is not nervous/anxious.        Objective:   Physical  Exam Constitutional:      Appearance: Normal appearance.  Neck:     Musculoskeletal: Neck supple. No neck rigidity or muscular tenderness.     Vascular: No carotid bruit.  Cardiovascular:     Rate and Rhythm: Normal rate and regular rhythm.     Pulses:          Dorsalis pedis pulses are 2+ on the right side and 1+ on the left side.       Posterior tibial pulses are 2+ on the right side and 1+ on the left side.  Pulmonary:     Effort: Pulmonary effort is normal.     Breath sounds: Normal breath sounds.  Abdominal:     General: Abdomen is flat.     Palpations: Abdomen is soft.     Tenderness: There is right CVA tenderness and left CVA tenderness. There is no guarding or rebound.     Comments: Patient with complaint of bilateral CVA area tenderness but on exam this appears to be related to her chronic back pain/muscle spasm  Musculoskeletal:        General: Tenderness present.     Right lower leg: No edema.     Left lower leg: No edema.     Right foot: Normal range of motion. No deformity, Charcot foot or foot drop.     Left foot: Normal range of motion. No deformity, Charcot foot or foot drop.     Comments: Lumbosacral tenderness to palpation as well as some curvature of the upper to mid lumbar spine consistent with scoliosos; thoracolumbar paraspinous spasm  Feet:     Right foot:     Protective Sensation: 10 sites tested. 9 sites sensed.     Skin integrity: Callus and dry skin present. No ulcer, blister, skin breakdown, erythema, warmth or fissure.     Toenail Condition: Right toenails are normal.     Left foot:     Protective Sensation: 10 sites tested. 8 sites sensed.     Skin integrity: Callus and dry skin present. No ulcer, blister, skin breakdown, erythema, warmth or fissure.     Toenail Condition: Left toenails are normal.     Comments: Negative monofilament on heels bilaterally and left ball of foot below the great toe Lymphadenopathy:     Cervical: No cervical adenopathy.    Skin:    General: Skin is warm and dry.  Neurological:     Mental Status: She is alert and oriented to person, place, and time.     Sensory: Sensory deficit present.  Psychiatric:        Mood and Affect: Mood normal.        Behavior: Behavior normal.    BP 127/85 (BP Location: Right Arm, Patient Position: Sitting, Cuff Size: Large)   Pulse  90   Temp 98.5 F (36.9 C) (Oral)   Ht 5\' 7"  (1.702 m)   Wt 169 lb 12.8 oz (77 kg)   SpO2 100%   BMI 26.59 kg/m         Assessment & Plan:  1. Uncontrolled type 2 diabetes mellitus with hyperglycemia (H[facility]) Hgb A1c at 9.7 and random glucose of 167.  Patient is encouraged to be complaint with her insulin on a daily basis as well as continued monitoring of her blood sugars. Insulin therapy refilled at today's visit. Discussed diabetic foot care and need for yearly DM eye exam. CMP at today's visit - HgB A1c  - Glucose (CBG) - HM Diabetes Foot Exam - Comprehensive metabolic panel - Insulin Glargine (LANTUS SOLOSTAR) 100 UNIT/ML Solostar Pen; Inject 48 Units into the skin at bedtime.  Dispense: 12 mL; Refill: 4 - insulin aspart (NOVOLOG FLEXPEN) 100 UNIT/ML FlexPen; Inject 10 units before your 2 largest meals of the day.  Dispense: 6 mL; Refill: 3  2. Mixed hyperlipidemia due to type 2 diabetes mellitus ([place]) Lipid panel in Dec 2019 with LDL that could not be calculated due to high TG level of 456. She is encouraged to follow a low fat diet as well as continued use of Pravastatin and Tricor. Will check liver enzymes at today's visit   3. Essential hypertension Blood pressure is controlled at today's visit and patient states that she is not currently taking her BP medication.   4. Chronic bilateral low back pain without sciatica Patient with complaint of continued chronic low back pain and requests refill of tramadol which was provided - traMADol (ULTRAM) 50 MG tablet; TAKE 1 TABLET BY MOUTH EVERY 12 HORUS AS NEEDED FOR SEVERE PAIN  Dispense:  60 tablet; Refill: 0  5. Diabetic peripheral neuropathy [facility]) Patient reports that she is no longer taking the gabapentin that was prescribed to help with her neuropathic symptoms  6. Peripheral vascular disease with claudication ([place]) Patient with decreased peripheral pulses and compliant of leg fatigue and cramping especially when she is walking uphill or on an incline. Patient will be referred to vascular surgery for further evaluation and treatment. Smoking cessation encouraged as well as compliance with medications for hyperlipidemia - Ambulatory referral to Vascular Surgery  7. Bipolar disorder, in partial remission, most recent episode mixed [facility]) Patient with a history of bipolar disorder and I agreed her initial visit to fill her seroquel until she could establish with psychiatry however she has so far refused to do so. I am discussed this with the patient and made her aware that she would be referred to psychiatry, because she is obviously still having issues with her bipolar disorder as evidenced by her interactions with her daughter and medication non-compliance.  - Ambulatory referral to Psychiatry - QUEtiapine (SEROQUEL) 400 MG tablet; Take 2 tablets (800 mg total) by mouth at bedtime.  Dispense: 60 tablet; Refill: 0  8. Tobacco dependence Discussed the importance of smoking cessation with the patient as she is increased risk of peripheral vascular disease, heart attack and stroke due to multiple medical issues and her tobacco use can accelerate her vascular disease. She states that she has no plans to ever stop smoking  9. Non compliance with medical treatment She reports that she has recently stopped the use of her medications due to being stressed out over issues with her daughter but that she has restarted most of her medications  10. Encounter for long-term (current) use of medications Patient will  have CMP in follow-up of long term use of medications  11. Lumbar  degenerative disc disease At the end of the visit patient requested refill of tramadol for chronic low back pain - traMADol (ULTRAM) 50 MG tablet; TAKE 1 TABLET BY MOUTH EVERY 12 HORUS AS NEEDED FOR SEVERE PAIN  Dispense: 60 tablet; Refill: 0  An After Visit Summary was printed and given to the patient.  Allergies as of 01/20/2019   No Known Allergies     Medication List       Accurate as of January 20, 2019 11:59 PM. Always use your most recent med list.        aspirin EC 81 MG tablet Take 1 tablet (81 mg total) by mouth daily.   budesonide-formoterol 160-4.5 MCG/ACT inhaler Commonly known as:  Symbicort Inhale 2 puffs into the lungs 2 (two) times daily.   fenofibrate 48 MG tablet Commonly known as:  Tricor Take 1 tablet (48 mg total) by mouth daily. To lower triglycerides   gabapentin 100 MG capsule Commonly known as:  NEURONTIN Take 2 capsules (200 mg total) by mouth at bedtime.   insulin aspart 100 UNIT/ML FlexPen Commonly known as:  NovoLOG FlexPen Inject 10 units before your 2 largest meals of the day.   Insulin Glargine 100 UNIT/ML Solostar Pen Commonly known as:  Lantus SoloStar Inject 48 Units into the skin at bedtime.   Insulin Pen Needle 32G X 4 MM Misc Commonly known as:  TRUEplus Pen Needles Use to inject insulin.   nicotine 21 mg/24hr patch Commonly known as:  NICODERM CQ - dosed in mg/24 hours Place 1 patch (21 mg total) onto the skin daily.   oxyCODONE-acetaminophen 7.5-325 MG tablet Commonly known as:  PERCOCET Take 1 tablet by mouth 3 (three) times daily as needed for pain.   pravastatin 20 MG tablet Commonly known as:  Pravachol Take 1 tablet (20 mg total) by mouth daily.   ProAir HFA 108 (90 Base) MCG/ACT inhaler Generic drug:  albuterol Inhale 1-2 puffs into the lungs every 6 (six) hours as needed for wheezing or cough.   QUEtiapine 400 MG tablet Commonly known as:  SEROQUEL Take 2 tablets (800 mg total) by mouth at bedtime.   traMADol  50 MG tablet Commonly known as:  ULTRAM TAKE 1 TABLET BY MOUTH EVERY 12 HORUS AS NEEDED FOR SEVERE PAIN        Return in about 3 months (around 04/21/2019) for DM/lipids.

## 2019-01-20 NOTE — Progress Notes (Signed)
DM follow up   Med Refills

## 2019-01-21 LAB — COMPREHENSIVE METABOLIC PANEL WITH GFR
ALT: 13 IU/L (ref 0–32)
AST: 13 IU/L (ref 0–40)
Albumin/Globulin Ratio: 1.6 (ref 1.2–2.2)
Albumin: 4.4 g/dL (ref 3.8–4.9)
Alkaline Phosphatase: 115 IU/L (ref 39–117)
BUN/Creatinine Ratio: 16 (ref 9–23)
BUN: 16 mg/dL (ref 6–24)
Bilirubin Total: <0.2 mg/dL (ref 0.0–1.2)
CO2: 23 mmol/L (ref 20–29)
Calcium: 10.1 mg/dL (ref 8.7–10.2)
Chloride: 106 mmol/L (ref 96–106)
Creatinine, Ser: 0.98 mg/dL (ref 0.57–1.00)
GFR calc Af Amer: 73 mL/min/1.73
GFR calc non Af Amer: 63 mL/min/1.73
Globulin, Total: 2.7 g/dL (ref 1.5–4.5)
Glucose: 41 mg/dL — ABNORMAL LOW (ref 65–99)
Potassium: 4 mmol/L (ref 3.5–5.2)
Sodium: 145 mmol/L — ABNORMAL HIGH (ref 134–144)
Total Protein: 7.1 g/dL (ref 6.0–8.5)

## 2019-01-28 ENCOUNTER — Encounter: Payer: Self-pay | Admitting: Family Medicine

## 2019-01-28 ENCOUNTER — Ambulatory Visit: Payer: Self-pay | Attending: Family Medicine | Admitting: Family Medicine

## 2019-01-28 ENCOUNTER — Telehealth: Payer: Self-pay | Admitting: *Deleted

## 2019-01-28 ENCOUNTER — Other Ambulatory Visit: Payer: Self-pay

## 2019-01-28 NOTE — Telephone Encounter (Signed)
Staff was on the phone with patient doing her phone visit. Per pt she is not able to finish the visit and need to cancel the appointment. Patient verbally sounded distant and staff had to keep repeating this 3-4 times for patient to respond to staff. Patient then stated she have to go and can't finish the appointment because she have to go pick up her grand kids. Then few minutes later, patient then stated she is not say things correctly and thinking correctly because she just took her medication. Staff asked her what medication did she take and patient stated she took her Seroquel and it's making her sleepy. Patient then stated she can't finish this visit and need to resch. Staff then asked patient she thought she was going to pick up her kids. Patient then stated yes she is but she's sleepy and she's about to go to sleep. Staff informed patient to call office back and resch her appointment for a better day and time that will work better for her and patient agreed. Provider is aware.

## 2019-01-30 ENCOUNTER — Encounter: Payer: Self-pay | Admitting: Family Medicine

## 2019-02-14 MED FILL — ?QUETIAPINE FUMARATE 400 MG: 400 | 30 days supply | Qty: 60 | Fill #2

## 2019-03-01 NOTE — Progress Notes (Signed)
Patient was unable to be contacted for today's visit

## 2019-03-08 MED FILL — ?QUETIAPINE FUMARATE 400 MG: 400 | 30 days supply | Qty: 60 | Fill #0

## 2019-03-25 ENCOUNTER — Encounter: Payer: Medicaid Other | Admitting: Vascular Surgery

## 2019-03-25 ENCOUNTER — Encounter (HOSPITAL_COMMUNITY): Payer: Medicaid Other

## 2019-03-28 ENCOUNTER — Telehealth: Payer: Self-pay | Admitting: Family Medicine

## 2019-03-28 ENCOUNTER — Other Ambulatory Visit: Payer: Self-pay | Admitting: Family Medicine

## 2019-03-28 DIAGNOSIS — F3177 Bipolar disorder, in partial remission, most recent episode mixed: Secondary | ICD-10-CM

## 2019-03-28 MED ORDER — QUETIAPINE FUMARATE 400 MG PO TABS: 800.0000 mg | ORAL_TABLET | Freq: Every day | ORAL | 0 refills | Status: DC

## 2019-03-28 NOTE — Progress Notes (Signed)
Patient ID: Ashley [name], female   DOB: 12-Mar-1961, 58 y.o.   MRN: [id number]   Refill request received from pharmacy for refill of patient's Seroquel. She has been asked at prior appointments to please establish care with a mental health provider which she has so far refused to do. I will send in a 30 day supply and discuss this with her again at her follow-up appointment in July.

## 2019-03-28 NOTE — Telephone Encounter (Signed)
1) Medication(s) Requested (by name): seroquel  2) Pharmacy of Choice: [email or web address] 3) Special Requests:   Approved medications will be sent to the pharmacy, we will reach out if there is an issue.  Requests made after 3pm may not be addressed until the following business day!  If a patient is unsure of the name of the medication(s) please note and ask patient to call back when they are able to provide all info, do not send to responsible party until all information is available!

## 2019-04-04 MED FILL — ?QUETIAPINE FUMARATE 400 MG: 400 | 30 days supply | Qty: 60 | Fill #0

## 2019-04-22 ENCOUNTER — Ambulatory Visit: Payer: Self-pay | Attending: Family Medicine | Admitting: Family Medicine

## 2019-04-22 ENCOUNTER — Encounter: Payer: Self-pay | Admitting: Family Medicine

## 2019-04-22 ENCOUNTER — Other Ambulatory Visit: Payer: Self-pay

## 2019-04-22 ENCOUNTER — Telehealth: Payer: Self-pay | Admitting: Family Medicine

## 2019-04-22 VITALS — BP 121/79 | HR 88 | Temp 98.2°F | Resp 18 | Ht 67.0 in | Wt 165.0 lb

## 2019-04-22 DIAGNOSIS — I739 Peripheral vascular disease, unspecified: Secondary | ICD-10-CM

## 2019-04-22 DIAGNOSIS — F17200 Nicotine dependence, unspecified, uncomplicated: Secondary | ICD-10-CM

## 2019-04-22 DIAGNOSIS — Z7982 Long term (current) use of aspirin: Secondary | ICD-10-CM

## 2019-04-22 DIAGNOSIS — E782 Mixed hyperlipidemia: Secondary | ICD-10-CM

## 2019-04-22 DIAGNOSIS — E1169 Type 2 diabetes mellitus with other specified complication: Secondary | ICD-10-CM

## 2019-04-22 DIAGNOSIS — K219 Gastro-esophageal reflux disease without esophagitis: Secondary | ICD-10-CM | POA: Insufficient documentation

## 2019-04-22 DIAGNOSIS — Z91199 Patient's noncompliance with other medical treatment and regimen due to unspecified reason: Secondary | ICD-10-CM

## 2019-04-22 DIAGNOSIS — M545 Low back pain, unspecified: Secondary | ICD-10-CM

## 2019-04-22 DIAGNOSIS — F17210 Nicotine dependence, cigarettes, uncomplicated: Secondary | ICD-10-CM

## 2019-04-22 DIAGNOSIS — F3177 Bipolar disorder, in partial remission, most recent episode mixed: Secondary | ICD-10-CM

## 2019-04-22 DIAGNOSIS — M51369 Other intervertebral disc degeneration, lumbar region without mention of lumbar back pain or lower extremity pain: Secondary | ICD-10-CM

## 2019-04-22 DIAGNOSIS — Z7951 Long term (current) use of inhaled steroids: Secondary | ICD-10-CM | POA: Insufficient documentation

## 2019-04-22 DIAGNOSIS — I10 Essential (primary) hypertension: Secondary | ICD-10-CM

## 2019-04-22 DIAGNOSIS — G8929 Other chronic pain: Secondary | ICD-10-CM

## 2019-04-22 DIAGNOSIS — Z79899 Other long term (current) drug therapy: Secondary | ICD-10-CM

## 2019-04-22 DIAGNOSIS — E1151 Type 2 diabetes mellitus with diabetic peripheral angiopathy without gangrene: Secondary | ICD-10-CM | POA: Insufficient documentation

## 2019-04-22 DIAGNOSIS — Z1159 Encounter for screening for other viral diseases: Secondary | ICD-10-CM

## 2019-04-22 DIAGNOSIS — Z794 Long term (current) use of insulin: Secondary | ICD-10-CM | POA: Insufficient documentation

## 2019-04-22 DIAGNOSIS — E1165 Type 2 diabetes mellitus with hyperglycemia: Secondary | ICD-10-CM

## 2019-04-22 DIAGNOSIS — Z833 Family history of diabetes mellitus: Secondary | ICD-10-CM | POA: Insufficient documentation

## 2019-04-22 DIAGNOSIS — M5136 Other intervertebral disc degeneration, lumbar region: Secondary | ICD-10-CM

## 2019-04-22 DIAGNOSIS — Z9119 Patient's noncompliance with other medical treatment and regimen: Secondary | ICD-10-CM

## 2019-04-22 LAB — POCT GLYCOSYLATED HEMOGLOBIN (HGB A1C): Hemoglobin A1C: 9 % — AB (ref 4.0–5.6)

## 2019-04-22 LAB — GLUCOSE, POCT (MANUAL RESULT ENTRY): POC Glucose: 228 mg/dL — AB (ref 70–99)

## 2019-04-22 MED ORDER — LANTUS SOLOSTAR 100 UNIT/ML ~~LOC~~ SOPN: 48.0000 [IU] | PEN_INJECTOR | Freq: Every day | SUBCUTANEOUS | 4 refills | Status: DC

## 2019-04-22 MED ORDER — NOVOLOG FLEXPEN 100 UNIT/ML ~~LOC~~ SOPN: PEN_INJECTOR | SUBCUTANEOUS | 3 refills | Status: DC

## 2019-04-22 MED ORDER — TRAMADOL HCL 50 MG PO TABS: ORAL_TABLET | ORAL | 0 refills | Status: DC

## 2019-04-22 MED ORDER — QUETIAPINE FUMARATE 200 MG PO TABS: ORAL_TABLET | ORAL | 4 refills | Status: DC

## 2019-04-22 MED ORDER — ROSUVASTATIN CALCIUM 10 MG PO TABS: 10.0000 mg | ORAL_TABLET | Freq: Every day | ORAL | 3 refills | Status: DC

## 2019-04-22 MED ORDER — PROAIR HFA 108 (90 BASE) MCG/ACT IN AERS: 1.0000 | INHALATION_SPRAY | Freq: Four times a day (QID) | RESPIRATORY_TRACT | 0 refills | Status: DC | PRN

## 2019-04-22 MED ORDER — FENOFIBRATE 48 MG PO TABS: 48.0000 mg | ORAL_TABLET | Freq: Every day | ORAL | 6 refills | Status: DC

## 2019-04-22 MED FILL — $novoLOG FLEXPEN SYRINGE: 100 | 30 days supply | Qty: 6 | Fill #0

## 2019-04-22 MED FILL — ?ROSUVASTATIN CALCIUM 10 MG: 10 | 30 days supply | Qty: 30 | Fill #0

## 2019-04-22 MED FILL — traMADol HCL 50 MG TABS: 50 | 30 days supply | Qty: 60 | Fill #0

## 2019-04-22 MED FILL — $LANTUS SOLOSTAR 100 UNITS/: 100 | 25 days supply | Qty: 12 | Fill #0

## 2019-04-22 NOTE — Progress Notes (Signed)
Established Patient Office Visit  Subjective:  Patient ID: Ashley [name], female    DOB: 08-02-1961  Age: 58 y.o. MRN: [id number]  CC: Follow-up of chronic issues  HPI Ashley [name] presents for follow-up of chronic medical issues including uncontrolled diabetes, hypertension, hyperlipidemia, tobacco dependence, peripheral vascular disease, lumbar degenerative disc disease with chronic midline low back pain, and bipolar disorder.  She reports that she did not follow-up with vascular surgery regarding peripheral vascular disease after her last appointment.  Patient continues to smoke approximately 1 pack/day of cigarettes.  Patient reports that she has had blood sugars ranging from being low to being slightly over 400.  She reports that she is currently taking 2 units of Lantus nightly.  She reports that she does not wish to follow-up with clinical pharmacist regarding her blood sugar because he kept changing her doses last time.  She does take short acting insulin before meals.  She does have increased thirst and urinary frequency.  She denies dysuria.      She continues to refuse follow-up with psychiatry.  She reports that her overall stress level has decreased.  She denies suicidal thoughts or ideations.  She continues to have chronic low back pain for which she reports prior surgery x2.  She states that her pain is always at a 7 on a 0-to-10 scale with 10 being the worst possible pain.  She states that if she tries to get up and do anything then her pain is a 10 or greater.  She requests refill of tramadol.  She states that she mostly lies down during the day as she cannot get up and do activities such as cleaning due to the increase in back pain.  Patient reports prior anemia and states that she probably feels tired because she is not taking her iron pills.  She is taking her blood pressure medication and denies any headaches or dizziness related to her blood pressure.  She denies any chest pain  or palpitations.  She denies shortness of breath or cough though she continues to smoke.  She denies leg pain but states that her legs get tired when she is walking.  She also reports that she has some occasional numbness and tingling in her left great toe but she is no longer taking Neurontin.  She reports compliance with cholesterol medications.  She states she needs refills of everything at today's visit, especially tramadol.  Past Medical History:  Diagnosis Date  . Anxiety   . Arthritis   . Bipolar 1 disorder ([place])   . Depression   . Diabetes mellitus without complication ([place])    Type II  . GERD (gastroesophageal reflux disease)   . Heart murmur    "nothing to worry about"  . HTN (hypertension)    not on medication    Past Surgical History:  Procedure Laterality Date  . ABDOMINAL HYSTERECTOMY    . LUMBAR LAMINECTOMY/DECOMPRESSION MICRODISCECTOMY Left 04/07/2016   Procedure: Left Lumbar four-five Microdiskectomy;  Surgeon: Erline Levine, MD;  Location: [facility];  Service: Neurosurgery;  Laterality: Left;    Family History  Problem Relation Age of Onset  . Diabetes Mellitus II Father   . Diabetes Brother   . Heart disease Neg Hx   . Stroke Neg Hx   . Kidney disease Neg Hx     Social History   Tobacco Use  . Smoking status: Current Every Day Smoker    Packs/day: 1.00    Years: 39.00  Pack years: 39.00    Types: Cigarettes  . Smokeless tobacco: Never Used  . Tobacco comment: pt states that she is using the E cigs and is trying to quit  Substance Use Topics  . Alcohol use: No  . Drug use: Yes    Types: Cocaine    Comment: 04/06/16- last time    Outpatient Medications Prior to Visit  Medication Sig Dispense Refill  . aspirin EC 81 MG tablet Take 1 tablet (81 mg total) by mouth daily. (Patient not taking: Reported on 01/20/2019) 90 tablet 3  . budesonide-formoterol (SYMBICORT) 160-4.5 MCG/ACT inhaler Inhale 2 puffs into the lungs 2 (two) times daily. 1 Inhaler 12    . fenofibrate (TRICOR) 48 MG tablet Take 1 tablet (48 mg total) by mouth daily. To lower triglycerides 30 tablet 6  . gabapentin (NEURONTIN) 100 MG capsule Take 2 capsules (200 mg total) by mouth at bedtime. (Patient not taking: Reported on 01/20/2019) 60 capsule 3  . insulin aspart (NOVOLOG FLEXPEN) 100 UNIT/ML FlexPen Inject 10 units before your 2 largest meals of the day. 6 mL 3  . Insulin Glargine (LANTUS SOLOSTAR) 100 UNIT/ML Solostar Pen Inject 48 Units into the skin at bedtime. 12 mL 4  . Insulin Pen Needle (TRUEPLUS PEN NEEDLES) 32G X 4 MM MISC Use to inject insulin. 100 each 11  . nicotine (NICODERM CQ - DOSED IN MG/24 HOURS) 21 mg/24hr patch Place 1 patch (21 mg total) onto the skin daily. (Patient not taking: Reported on 05/21/2018) 28 patch 0  . pravastatin (PRAVACHOL) 20 MG tablet Take 1 tablet (20 mg total) by mouth daily. 90 tablet 1  . PROAIR HFA 108 (90 Base) MCG/ACT inhaler Inhale 1-2 puffs into the lungs every 6 (six) hours as needed for wheezing or cough.  0  . QUEtiapine (SEROQUEL) 400 MG tablet Take 2 tablets (800 mg total) by mouth at bedtime. 60 tablet 0  . traMADol (ULTRAM) 50 MG tablet TAKE 1 TABLET BY MOUTH EVERY 12 HORUS AS NEEDED FOR SEVERE PAIN 60 tablet 0   No facility-administered medications prior to visit.     No Known Allergies  ROS Review of Systems  Constitutional: Positive for fatigue. Negative for chills and fever.  HENT: Negative for sore throat and trouble swallowing.   Eyes: Negative for photophobia and visual disturbance.  Respiratory: Negative for cough and shortness of breath.   Cardiovascular: Negative for chest pain and palpitations.  Gastrointestinal: Negative for abdominal pain, blood in stool, constipation, diarrhea and nausea.  Endocrine: Positive for polydipsia, polyphagia and polyuria.  Genitourinary: Positive for frequency. Negative for dysuria.  Musculoskeletal: Positive for arthralgias and back pain.  Neurological: Negative for  dizziness and headaches.  Hematological: Negative for adenopathy. Does not bruise/bleed easily.  Psychiatric/Behavioral: Negative for self-injury, sleep disturbance and suicidal ideas. The patient is not nervous/anxious.       Objective:    Physical Exam  Constitutional: She is oriented to person, place, and time. She appears well-developed and well-nourished.  Neck: Normal range of motion. Neck supple. No JVD present.  Soft noise in the left carotid but not technically a bruit  Cardiovascular: Normal rate and regular rhythm.  Pulmonary/Chest: Effort normal and breath sounds normal.  Abdominal: Soft. There is no abdominal tenderness. There is no rebound and no guarding.  Musculoskeletal: Normal range of motion.        General: Tenderness and deformity (patient with mild deformity at upper lumbar soine at area of surgical scar and tenderness fover lumbar  and sacral spine area) present.  Lymphadenopathy:    She has no cervical adenopathy.  Neurological: She is alert and oriented to person, place, and time.  Skin: Skin is warm and dry.  Healed, hyperpigmented mid-line vertical scar over mid-line of lower back  Psychiatric: She has a normal mood and affect. Her behavior is normal.  Patient with some abnormal fixed beliefs  Nursing note and vitals reviewed. DM foot exam-normal monofilament exam, no active skin breakdown on the feet and good nail care; poorly palpable pulses left greater than right  BP 121/79 (BP Location: Left Arm, Patient Position: Sitting, Cuff Size: Normal)   Pulse 88   Temp 98.2 F (36.8 C) (Oral)   Resp 18   Ht 5\' 7"  (1.702 m)   Wt 165 lb (74.8 kg)   SpO2 100%   BMI 25.84 kg/m  Wt Readings from Last 3 Encounters:  01/20/19 169 lb 12.8 oz (77 kg)  09/20/18 160 lb (72.6 kg)  05/21/18 165 lb (74.8 kg)     Health Maintenance Due  Topic Date Due  . Hepatitis C Screening  05-15-1961  . PNEUMOCOCCAL POLYSACCHARIDE VACCINE AGE 4-64 HIGH RISK  05/12/1961  .  OPHTHALMOLOGY EXAM  05/12/1969  . TETANUS/TDAP  05/12/1978  . PAP SMEAR-Modifier  05/12/1980  . COLONOSCOPY  05/12/2009      Lab Results  Component Value Date   TSH 0.178 (L) 05/17/2017   Lab Results  Component Value Date   WBC 11.7 (H) 05/21/2017   HGB 12.6 05/21/2017   HCT 37.8 05/21/2017   MCV 87.3 05/21/2017   PLT 440 (H) 05/21/2017   Lab Results  Component Value Date   NA 145 (H) 01/20/2019   K 4.0 01/20/2019   CO2 23 01/20/2019   GLUCOSE 41 (L) 01/20/2019   BUN 16 01/20/2019   CREATININE 0.98 01/20/2019   BILITOT <0.2 01/20/2019   ALKPHOS 115 01/20/2019   AST 13 01/20/2019   ALT 13 01/20/2019   PROT 7.1 01/20/2019   ALBUMIN 4.4 01/20/2019   CALCIUM 10.1 01/20/2019   ANIONGAP 12 05/21/2017   Lab Results  Component Value Date   CHOL 339 (H) 09/20/2018   Lab Results  Component Value Date   HDL 39 (L) 09/20/2018   Lab Results  Component Value Date   LDLCALC Comment 09/20/2018   Lab Results  Component Value Date   TRIG 456 (H) 09/20/2018   Lab Results  Component Value Date   CHOLHDL 8.7 (H) 09/20/2018   Lab Results  Component Value Date   HGBA1C 9.7 (A) 01/20/2019      Assessment & Plan:  1. Uncontrolled type 2 diabetes mellitus with hyperglycemia ([place]) Patient's hemoglobin A1c in April was elevated at 9.7.  She reports that her blood sugars continue to fluctuate.  She will have repeat hemoglobin A1c, lipid panel, microalbumin creatinine ratio and referral to ophthalmology for yearly diabetic eye exam.  Glucose at today's visit was 228.  Diabetic foot care discussed.  Refills provided of patient's medications and discussed that patient should follow-up with clinical pharmacist as she states that her blood sugars are still up and down.  Patient states that she does not wish to meet with the clinical pharmacist anymore and she does not wish to be referred to endocrinology.  Patient was provided with refills of her current medications. - HgB A1c -  Glucose (CBG) - Lipid panel - Microalbumin/Creatinine Ratio, Urine - Ambulatory referral to Ophthalmology - Insulin Glargine (LANTUS SOLOSTAR) 100 UNIT/ML Solostar Pen;  Inject 48 Units into the skin at bedtime.  Dispense: 12 mL; Refill: 4 - insulin aspart (NOVOLOG FLEXPEN) 100 UNIT/ML FlexPen; Inject 10 units before your 2 largest meals of the day.  Dispense: 6 mL; Refill: 3 - Comprehensive metabolic panel  2. Mixed hyperlipidemia due to type 2 diabetes mellitus ([place]) We will recheck lipid panel at today's visit as patient has history of dyslipidemia with elevated triglycerides.  She is not currently taking her fenofibrate.  Prescription provided for Crestor 10 mg daily.  Patient will be notified of the results of her lipid panel.  Patient will have CMP done in follow-up of her long-term use of cholesterol medication.  Patient was reminded of the importance of taking her cholesterol medication, controlling her blood pressure, continuing daily aspirin and complete smoking cessation as patient with evidence of peripheral vascular disease and symptoms of claudication. - Lipid panel - rosuvastatin (CRESTOR) 10 MG tablet; Take 1 tablet (10 mg total) by mouth daily. To lower cholesterol  Dispense: 90 tablet; Refill: 3 - Comprehensive metabolic panel  3. Essential hypertension At today's visit, patient's blood pressure is 121/79.  She is not currently on blood pressure medication.  Patient will have urine microalbumin creatinine at today's visit and if this is elevated, will suggest the patient start a low dose of lisinopril to help with renal protection due to her diabetes.  4. Peripheral vascular disease with claudication [facility]) Patient with peripheral vascular disease with claudication.  Patient reports that she is not always taking her cholesterol medication.  Patient will have repeat lipid panel at today's visit.  Patient also missed follow-up with vascular surgery to which she was referred at her  last visit and she does not wish to have any referral at today's visit.  She does continue to have issues with leg fatigue with ambulation.  Patient should continue her daily aspirin, she was again encouraged to stop smoking which patient does not feel that she is ready to attempt at this time and discussed the importance of controlling lipids and blood sugars. - Lipid panel - rosuvastatin (CRESTOR) 10 MG tablet; Take 1 tablet (10 mg total) by mouth daily. To lower cholesterol  Dispense: 90 tablet; Refill: 3  5. Encounter for long-term (current) use of medications Complete metabolic panel will be done at today's visit in follow-up of patient's long-term use of medications for the treatment of hypertension, hyperlipidemia and diabetes - Comprehensive metabolic panel  6. Chronic midline low back pain without sciatica Patient reports chronic midline low back pain currently without radiation for which she has had surgery in the past.  Patient will be referred to orthopedics for further evaluation and treatment.  Patient requests refill of tramadol at today's visit which was provided but she is aware that she needs specialty follow-up.  She is not interested in physical therapy at this time. - Ambulatory referral to Orthopedic Surgery  7. Tobacco dependence Tobacco dependence and the need for tobacco cessation again discussed with the patient at today's visit.  Patient is not interested in attempting to stop smoking at this time  8. Non compliance with medical treatment Patient reports at today's visit that she is not taking several of her medications and she has no issues with medication compliance.  9. Bipolar disorder, in partial remission, most recent episode mixed [facility]) Patient with bipolar disorder and at patient's initial visit, she was provided with a short-term supply of Seroquel which per patient was dosed at 800 mg nightly.  I discussed with  patient at that time that her dose exceeded the  recommended maximum dose of 600 mg and also encourage patient to establish follow-up with mental health provider regarding her bipolar disorder.  Patient has subsequently refused referral to establish with psychiatry.  Patient's dose of Seroquel lowered to 600 mg nightly in order to comply with recommendations regarding maximum dosage - QUEtiapine (SEROQUEL) 200 MG tablet; Take 2 pills at bedtime  Dispense: 60 tablet; Refill: 4  10. Need for hepatitis C screening test Due to patient's age and past history of drug use, patient will have screening test for hepatitis C - Hepatitis c antibody (reflex)  11. Long-term use of aspirin therapy CBC will be done at today's visit in follow-up of long-term use of aspirin.  Patient reports a history of anemia. - CBC with Differential  12. Lumbar degenerative disc disease; 13. Chronic bilateral low back pain without sciatica Refill provided of tramadol and patient is being referred to orthopedics for further evaluation and treatment of her chronic low back pain - traMADol (ULTRAM) 50 MG tablet; TAKE 1 TABLET BY MOUTH EVERY 12 HORUS AS NEEDED FOR SEVERE PAIN  Dispense: 60 tablet; Refill 0  Addendum: Patient was contacted by orthopedics regarding referral but declined.  New referral placed for pain medicine evaluation.   Follow-up: Return in about 4 months (around 08/23/2019) for DM/HTN/lipids.   Antony Blackbird, MD

## 2019-04-22 NOTE — Telephone Encounter (Signed)
Patient was called and informed that PCP adjusted medications due to dosage being very high and patient not wanting to est care with [facility]. Patient began to get very upset and requesting an in person appointment to discuss matter. Patient was informed that medication will not be adjusted, she needs to est care with [facility]

## 2019-04-22 NOTE — Telephone Encounter (Signed)
Patient came in concerned on why her Seroquel dosage has been changed. Please follow up

## 2019-04-24 ENCOUNTER — Other Ambulatory Visit: Payer: Self-pay | Admitting: Family Medicine

## 2019-04-24 DIAGNOSIS — E1169 Type 2 diabetes mellitus with other specified complication: Secondary | ICD-10-CM

## 2019-04-24 LAB — COMPREHENSIVE METABOLIC PANEL WITH GFR
ALT: 15 IU/L (ref 0–32)
AST: 16 IU/L (ref 0–40)
Albumin/Globulin Ratio: 1.8 (ref 1.2–2.2)
Albumin: 4.8 g/dL (ref 3.8–4.9)
Alkaline Phosphatase: 130 IU/L — ABNORMAL HIGH (ref 39–117)
BUN/Creatinine Ratio: 11 (ref 9–23)
BUN: 13 mg/dL (ref 6–24)
Bilirubin Total: <0.2 mg/dL (ref 0.0–1.2)
CO2: 21 mmol/L (ref 20–29)
Calcium: 9.8 mg/dL (ref 8.7–10.2)
Chloride: 105 mmol/L (ref 96–106)
Creatinine, Ser: 1.15 mg/dL — ABNORMAL HIGH (ref 0.57–1.00)
GFR calc Af Amer: 60 mL/min/1.73
GFR calc non Af Amer: 52 mL/min/1.73 — ABNORMAL LOW
Globulin, Total: 2.6 g/dL (ref 1.5–4.5)
Glucose: 101 mg/dL — ABNORMAL HIGH (ref 65–99)
Potassium: 4.4 mmol/L (ref 3.5–5.2)
Sodium: 144 mmol/L (ref 134–144)
Total Protein: 7.4 g/dL (ref 6.0–8.5)

## 2019-04-24 LAB — CBC WITH DIFFERENTIAL/PLATELET
Basophils Absolute: 0.1 x10E3/uL (ref 0.0–0.2)
Basos: 1 %
EOS (ABSOLUTE): 0.3 x10E3/uL (ref 0.0–0.4)
Eos: 5 %
Hematocrit: 42 % (ref 34.0–46.6)
Hemoglobin: 13.5 g/dL (ref 11.1–15.9)
Immature Grans (Abs): 0 x10E3/uL (ref 0.0–0.1)
Immature Granulocytes: 0 %
Lymphocytes Absolute: 3.5 x10E3/uL — ABNORMAL HIGH (ref 0.7–3.1)
Lymphs: 46 %
MCH: 30.2 pg (ref 26.6–33.0)
MCHC: 32.1 g/dL (ref 31.5–35.7)
MCV: 94 fL (ref 79–97)
Monocytes Absolute: 0.4 x10E3/uL (ref 0.1–0.9)
Monocytes: 6 %
Neutrophils Absolute: 3.2 x10E3/uL (ref 1.4–7.0)
Neutrophils: 42 %
Platelets: 346 x10E3/uL (ref 150–450)
RBC: 4.47 x10E6/uL (ref 3.77–5.28)
RDW: 14.4 % (ref 11.7–15.4)
WBC: 7.5 x10E3/uL (ref 3.4–10.8)

## 2019-04-24 LAB — MICROALBUMIN / CREATININE URINE RATIO
Creatinine, Urine: 330.5 mg/dL
Microalb/Creat Ratio: 14 mg/g{creat} (ref 0–29)
Microalbumin, Urine: 46.5 ug/mL

## 2019-04-24 LAB — LIPID PANEL
Chol/HDL Ratio: 12.4 ratio — ABNORMAL HIGH (ref 0.0–4.4)
Cholesterol, Total: 373 mg/dL — ABNORMAL HIGH (ref 100–199)
HDL: 30 mg/dL — ABNORMAL LOW
Triglycerides: 890 mg/dL (ref 0–149)

## 2019-04-24 LAB — HCV COMMENT:

## 2019-04-24 LAB — HEPATITIS C ANTIBODY (REFLEX): HCV Ab: 0.2 {s_co_ratio} (ref 0.0–0.9)

## 2019-04-24 MED ORDER — FENOFIBRATE 120 MG PO TABS: 120.0000 mg | ORAL_TABLET | Freq: Every day | ORAL | 4 refills | Status: DC

## 2019-04-24 NOTE — Progress Notes (Signed)
Patient ID: Ashley [name], female   DOB: 1961-01-10, 58 y.o.   MRN: [id number]   Patient with elevated TG on recent labs and is currently on Tricor which she is likely not taking. Patient's dose of fenofibrate will be increased and she will also be referred to cardiology as patient with DM which increases her risk for heart disease.

## 2019-04-26 NOTE — Telephone Encounter (Signed)
Patient verified DOB Patient is aware of Tricor being ordered to [facility] pharmacy to address elevated triglycerides, Patient is also aware of not being anemic and hepatitis C being negative. Patient is aware of referral to cardiology for increased risk of heart concerns and to drink 80 to 100 oz of water to address elevated creatinine levels.

## 2019-04-27 ENCOUNTER — Other Ambulatory Visit: Payer: Self-pay | Admitting: Family Medicine

## 2019-04-27 ENCOUNTER — Telehealth: Payer: Self-pay | Admitting: Pharmacist

## 2019-04-27 DIAGNOSIS — E785 Hyperlipidemia, unspecified: Secondary | ICD-10-CM

## 2019-04-27 MED ORDER — GEMFIBROZIL 600 MG PO TABS: 600.0000 mg | ORAL_TABLET | Freq: Two times a day (BID) | ORAL | 5 refills | Status: DC

## 2019-04-27 NOTE — Telephone Encounter (Signed)
Okay, I will change to lopid but I doubt that this will be of much help

## 2019-04-27 NOTE — Telephone Encounter (Signed)
I am also referring her to cardiology to lipid clinic. Can she continue her current fenofibrate dose or will she need to change from this medication entirely

## 2019-04-27 NOTE — Progress Notes (Signed)
Patient ID: Ashley [name], female   DOB: 1961/02/03, 58 y.o.   MRN: [id number]   Patient's fenofibrate cannot be continued secondary to the cost per [facility] pharmacy.  Patient will be placed on Lopid 20 mg twice daily and should continue Crestor 10 mg.  Patient's insurance is listed as [employer] but this may no longer be an effect as pharmacy reports that they cannot afford the cost of the medication for the patient.

## 2019-04-27 NOTE — Telephone Encounter (Signed)
Pharmacy is wanting to change completely due to cost of obtaining the medication.

## 2019-04-27 NOTE — Telephone Encounter (Signed)
Notified by [facility] pharmacy that we cannot order fenofibrate d/t cost. They are requesting a therapeutic alternative. Will route to PCP for review.

## 2019-04-29 MED FILL — QUETIAPINE FUMARATE 200 MG: 200 | 30 days supply | Qty: 60 | Fill #0

## 2019-05-18 ENCOUNTER — Other Ambulatory Visit: Payer: Self-pay

## 2019-05-18 ENCOUNTER — Telehealth: Payer: Self-pay | Admitting: Family Medicine

## 2019-05-18 ENCOUNTER — Encounter: Payer: Self-pay | Admitting: Family Medicine

## 2019-05-18 ENCOUNTER — Ambulatory Visit: Payer: Self-pay | Attending: Family Medicine | Admitting: Family Medicine

## 2019-05-18 ENCOUNTER — Other Ambulatory Visit: Payer: Self-pay | Admitting: Family Medicine

## 2019-05-18 DIAGNOSIS — G8929 Other chronic pain: Secondary | ICD-10-CM

## 2019-05-18 DIAGNOSIS — G479 Sleep disorder, unspecified: Secondary | ICD-10-CM

## 2019-05-18 DIAGNOSIS — F3177 Bipolar disorder, in partial remission, most recent episode mixed: Secondary | ICD-10-CM

## 2019-05-18 DIAGNOSIS — M5136 Other intervertebral disc degeneration, lumbar region: Secondary | ICD-10-CM

## 2019-05-18 MED ORDER — QUETIAPINE FUMARATE 400 MG PO TABS: 400.0000 mg | ORAL_TABLET | Freq: Two times a day (BID) | ORAL | 1 refills | Status: DC

## 2019-05-18 NOTE — Telephone Encounter (Signed)
Follow up  Pt is calling wanting to make sure that the correct dose is sent over for her Seroquel, pt says she takes 800 mg and would like it sent to [facility] pharmacy. Please f/u

## 2019-05-18 NOTE — Telephone Encounter (Signed)
I think that the patient who is supposed to be seen at 1:50 today is still in the hospital. Please offer the 1:50 appointment and Octavia, please find out which medication patient is having issues with an make sure that she has stopped the medication. Suggest ED/urgentcare based on severity of symptoms such as if she has complaint of chest pain/palpitations, throat swelling, etc

## 2019-05-18 NOTE — Telephone Encounter (Signed)
She told me on her phone appointment that she wanted the medication sent to [facility] on [address] and that she has actually been taking the medication twice per day. New RX for Seroquel 400 mg twice daily was sent to [facility] with 1 refill and new referrals placed for her to follow-up with psychiatry and a sleep specialist

## 2019-05-18 NOTE — Progress Notes (Signed)
Virtual Visit via Telephone Note  I connected with Ashley [name] on 05/18/19 at  1:50 PM EDT by telephone and verified that I am speaking with the correct person using two identifiers.   I discussed the limitations, risks, security and privacy concerns of performing an evaluation and management service by telephone and the availability of in person appointments. I also discussed with the patient that there may be a patient responsible charge related to this service. The patient expressed understanding and agreed to proceed.  Patient Location: Home Provider Location: [facility] Others participating in call: Call initiated by Emilio Aspen, RMA and transferred to me   History of Present Illness:        Patient reports an inability to sleep since her dose of Seroquel was changed from 800 mg at bedtime to 600 mg.  She reports that she was on Seroquel 800 mg for 13 years due to issues with sleep.  She reports that after moving from [place] to [place] she was having hospitalizations once to twice per year for nervous breakdowns until she was placed on Seroquel 800 mg which prevented further hospitalizations.  She denies any psychiatric issues but states that all of her problems are related to issues with sleep.  Patient states that she has refused to follow-up with a psychiatrist regarding her Seroquel because she does not have any psychiatric issues.  Since her dose of Seroquel has been decreased she states that she continues to have severe issues with inability to sleep as she states that previously she was really taking the Seroquel twice per day so that she could take a nap during the day as well asleep at night. (Patient with rapid speech throughout the phone call and continually complained that her prior doctor changed her Seroquel dose but despite identifying myself by name is beginning of the phone encounter, patient did not seem to realize whom she was speaking with as I decreased her dose  of Seroquel after explaining to her repeatedly that the 800 mg once at bedtime exceeded the recommended dosage of this medication and also that she needed to follow-up with psychiatry regarding her history of bipolar disorder as patient at past visits has expressed issues with interactions with family members as well as having issues with anxiety and depression.)   Past Medical History:  Diagnosis Date  . Anxiety   . Arthritis   . Bipolar 1 disorder ([place])   . Depression   . Diabetes mellitus without complication ([place])    Type II  . GERD (gastroesophageal reflux disease)   . Heart murmur    "nothing to worry about"  . HTN (hypertension)    not on medication    Past Surgical History:  Procedure Laterality Date  . ABDOMINAL HYSTERECTOMY    . LUMBAR LAMINECTOMY/DECOMPRESSION MICRODISCECTOMY Left 04/07/2016   Procedure: Left Lumbar four-five Microdiskectomy;  Surgeon: Erline Levine, MD;  Location: [facility];  Service: Neurosurgery;  Laterality: Left;    Family History  Problem Relation Age of Onset  . Diabetes Mellitus II Father   . Diabetes Brother   . Heart disease Neg Hx   . Stroke Neg Hx   . Kidney disease Neg Hx     Social History   Tobacco Use  . Smoking status: Current Every Day Smoker    Packs/day: 1.00    Years: 39.00    Pack years: 39.00    Types: Cigarettes  . Smokeless tobacco: Never Used  . Tobacco comment: pt  states that she is using the E cigs and is trying to quit  Substance Use Topics  . Alcohol use: No  . Drug use: Yes    Types: Cocaine    Comment: 04/06/16- last time     No Known Allergies     Observations/Objective: No vital signs or physical exam conducted as visit was done via telephone  Assessment and Plan: 1. Bipolar disorder, in partial remission, most recent episode mixed [facility]) Patient had called earlier today for work in appointment due to multiple issues and patient was placed on the schedule today due to a cancellation and because of her  multiple symptom complaint but patient states that her only problem is that she is no longer on her Seroquel 800 mg daily.  Patient had reported taking Seroquel 800 mg at bedtime at prior visits but today she states that she has been taking Seroquel 400 mg twice daily as she feels the need to take a nap every day and now she cannot nap without having the higher dose of Seroquel that she was on in the past.  Patient is provided with new prescription for Seroquel to take twice daily as the 800 mg taken at once exceeds recommended dosage.  Patient is also being once again referred to psychiatry in follow-up of her bipolar disorder.  Patient unfortunately is noncompliant due to intermittent use, with medications for treatment of her other chronic medical conditions. - Ambulatory referral to Psychiatry - QUEtiapine (SEROQUEL) 400 MG tablet; Take 1 tablet (400 mg total) by mouth 2 (two) times daily. Schedule psychiatry follow-up  Dispense: 60 tablet; Refill: 1  2. Sleep disorder Patient reports longstanding issue with sleep and she is being referred to see a sleep specialist for further evaluation - Ambulatory referral to Sleep Studies  Follow Up Instructions:Return for follow-up with psychiatry and sleep specialist; keep follow-up appointment for DM.    I discussed the assessment and treatment plan with the patient. The patient was provided an opportunity to ask questions and all were answered. The patient agreed with the plan and demonstrated an understanding of the instructions.   The patient was advised to call back or seek an in-person evaluation if the symptoms worsen or if the condition fails to improve as anticipated.  I provided 11 minutes of non-face-to-face time during this encounter.   Antony Blackbird, MD

## 2019-05-18 NOTE — Telephone Encounter (Signed)
New Message   Pt states she had a medication change and does not know why. The change is causing her to have headaches, fatigue, weakness, she can not eat and her body is in a states of shock. Offered the pt our first available but she wants something sooner. Please f/u

## 2019-05-18 NOTE — Progress Notes (Signed)
Spoke with patient and she stated that her complaint is only about her Seroquel. Per pt she is currently taking 200 mg and she have always been on 800 mg Seroquel and she wants to go back on the 800 mg. Per pt she is not having any other symptoms she just want to go back on her Seroquel 800 mg.   Per pt she do not take her medications that are marked no taking like she should.

## 2019-05-18 NOTE — Telephone Encounter (Signed)
Spoke with patient and she stated that her complaint is only about her Seroquel. Per pt she is currently taking 200 mg and she have always been on 800 mg Seroquel and she wants to go back on the 800 mg. Per pt she is not having any other symptoms. Appt was made and she verbalized understanding.

## 2019-05-19 MED FILL — QUETIAPINE FUMARATE 400 MG: 400 | 30 days supply | Qty: 60 | Fill #0

## 2019-05-23 NOTE — Telephone Encounter (Signed)
LMOM

## 2019-06-06 NOTE — Telephone Encounter (Signed)
Message reviewed reviewed

## 2019-06-06 NOTE — Telephone Encounter (Signed)
Spoke with patient to follow up as to if she picked up her medication and to see if she received a call yet about her sleep study referral and her Psychiatry referral. Per patient she have already picked up her medication. Per pt she is not going to either referral. Per pt she have the Seroquel to take care of her and she do not have time to be going to these places. Per pt she have been content for 10 years and she's not going to either referral.

## 2019-06-14 MED FILL — ?QUETIAPINE FUMARATE 400MG: 400 | 30 days supply | Qty: 60 | Fill #1

## 2019-07-12 ENCOUNTER — Other Ambulatory Visit: Payer: Self-pay | Admitting: Family Medicine

## 2019-07-12 DIAGNOSIS — G8929 Other chronic pain: Secondary | ICD-10-CM

## 2019-07-12 DIAGNOSIS — F3177 Bipolar disorder, in partial remission, most recent episode mixed: Secondary | ICD-10-CM

## 2019-07-12 DIAGNOSIS — M5136 Other intervertebral disc degeneration, lumbar region: Secondary | ICD-10-CM

## 2019-08-05 MED FILL — ?QUETIAPINE FUMARATE 400MG: 400 | 30 days supply | Qty: 60 | Fill #1

## 2019-08-10 ENCOUNTER — Other Ambulatory Visit: Payer: Self-pay | Admitting: Family Medicine

## 2019-08-10 ENCOUNTER — Other Ambulatory Visit: Payer: Self-pay | Admitting: Internal Medicine

## 2019-08-10 DIAGNOSIS — Z1231 Encounter for screening mammogram for malignant neoplasm of breast: Secondary | ICD-10-CM

## 2019-08-24 ENCOUNTER — Ambulatory Visit: Payer: Medicaid Other | Admitting: Family Medicine

## 2019-09-14 ENCOUNTER — Other Ambulatory Visit: Payer: Self-pay

## 2019-09-14 ENCOUNTER — Encounter: Payer: Self-pay | Admitting: Family Medicine

## 2019-09-14 ENCOUNTER — Ambulatory Visit: Payer: Self-pay | Attending: Family Medicine | Admitting: Family Medicine

## 2019-09-14 VITALS — BP 133/84 | HR 89 | Temp 98.2°F | Resp 18 | Ht 67.0 in | Wt 164.0 lb

## 2019-09-14 DIAGNOSIS — G8929 Other chronic pain: Secondary | ICD-10-CM

## 2019-09-14 DIAGNOSIS — M545 Low back pain, unspecified: Secondary | ICD-10-CM

## 2019-09-14 DIAGNOSIS — M51369 Other intervertebral disc degeneration, lumbar region without mention of lumbar back pain or lower extremity pain: Secondary | ICD-10-CM

## 2019-09-14 DIAGNOSIS — Z833 Family history of diabetes mellitus: Secondary | ICD-10-CM | POA: Insufficient documentation

## 2019-09-14 DIAGNOSIS — I10 Essential (primary) hypertension: Secondary | ICD-10-CM | POA: Insufficient documentation

## 2019-09-14 DIAGNOSIS — Z8679 Personal history of other diseases of the circulatory system: Secondary | ICD-10-CM

## 2019-09-14 DIAGNOSIS — E1140 Type 2 diabetes mellitus with diabetic neuropathy, unspecified: Secondary | ICD-10-CM

## 2019-09-14 DIAGNOSIS — E782 Mixed hyperlipidemia: Secondary | ICD-10-CM

## 2019-09-14 DIAGNOSIS — M5136 Other intervertebral disc degeneration, lumbar region: Secondary | ICD-10-CM

## 2019-09-14 DIAGNOSIS — R7989 Other specified abnormal findings of blood chemistry: Secondary | ICD-10-CM

## 2019-09-14 DIAGNOSIS — F419 Anxiety disorder, unspecified: Secondary | ICD-10-CM | POA: Insufficient documentation

## 2019-09-14 DIAGNOSIS — Z90710 Acquired absence of both cervix and uterus: Secondary | ICD-10-CM | POA: Insufficient documentation

## 2019-09-14 DIAGNOSIS — Z9119 Patient's noncompliance with other medical treatment and regimen: Secondary | ICD-10-CM | POA: Insufficient documentation

## 2019-09-14 DIAGNOSIS — Z79899 Other long term (current) drug therapy: Secondary | ICD-10-CM

## 2019-09-14 DIAGNOSIS — N289 Disorder of kidney and ureter, unspecified: Secondary | ICD-10-CM

## 2019-09-14 DIAGNOSIS — F17210 Nicotine dependence, cigarettes, uncomplicated: Secondary | ICD-10-CM

## 2019-09-14 DIAGNOSIS — F17200 Nicotine dependence, unspecified, uncomplicated: Secondary | ICD-10-CM

## 2019-09-14 DIAGNOSIS — Z91199 Patient's noncompliance with other medical treatment and regimen due to unspecified reason: Secondary | ICD-10-CM

## 2019-09-14 DIAGNOSIS — E1165 Type 2 diabetes mellitus with hyperglycemia: Secondary | ICD-10-CM

## 2019-09-14 DIAGNOSIS — R946 Abnormal results of thyroid function studies: Secondary | ICD-10-CM | POA: Insufficient documentation

## 2019-09-14 DIAGNOSIS — F3177 Bipolar disorder, in partial remission, most recent episode mixed: Secondary | ICD-10-CM

## 2019-09-14 DIAGNOSIS — E1142 Type 2 diabetes mellitus with diabetic polyneuropathy: Secondary | ICD-10-CM

## 2019-09-14 DIAGNOSIS — Z794 Long term (current) use of insulin: Secondary | ICD-10-CM | POA: Insufficient documentation

## 2019-09-14 MED ORDER — NOVOLOG FLEXPEN 100 UNIT/ML ~~LOC~~ SOPN: PEN_INJECTOR | SUBCUTANEOUS | 3 refills | Status: DC

## 2019-09-14 MED ORDER — LANTUS SOLOSTAR 100 UNIT/ML ~~LOC~~ SOPN: 48.0000 [IU] | PEN_INJECTOR | Freq: Every day | SUBCUTANEOUS | 4 refills | Status: DC

## 2019-09-14 MED ORDER — QUETIAPINE FUMARATE 400 MG PO TABS: ORAL_TABLET | ORAL | 0 refills | Status: DC

## 2019-09-14 MED ORDER — TRUEPLUS PEN NEEDLES 32G X 4 MM MISC: 11 refills | Status: DC

## 2019-09-14 MED ORDER — TRAMADOL HCL 50 MG PO TABS: ORAL_TABLET | ORAL | 0 refills | Status: DC

## 2019-09-14 MED FILL — $LANTUS SOLOSTAR 100 UNITS/: 100 | 25 days supply | Qty: 12 | Fill #0

## 2019-09-14 MED FILL — ?QUETIAPINE FUMARATE 400MG: 400 | 30 days supply | Qty: 60 | Fill #0

## 2019-09-14 MED FILL — traMADol HCL 50 MG TABS: 50 | 30 days supply | Qty: 60 | Fill #0

## 2019-09-14 MED FILL — $novoLOG FLEXPEN SYRINGE: 100 | 30 days supply | Qty: 6 | Fill #0
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
## 2019-09-14 NOTE — Patient Instructions (Addendum)
Referrals have been placed for you to follow-up with psychiatry regarding your bipolar disorder, referral to pain management regarding your chronic back pain as well as referral back to cardiology regarding your lipids and increased risk of heart disease associated with diabetes.  Please see information below regarding smoking cessation and lipids. Dyslipidemia Dyslipidemia is an imbalance of waxy, fat-like substances (lipids) in the blood. The body needs lipids in small amounts. Dyslipidemia often involves a high level of cholesterol or triglycerides, which are types of lipids. Common forms of dyslipidemia include:  High levels of LDL cholesterol. LDL is the type of cholesterol that causes fatty deposits (plaques) to build up in the blood vessels that carry blood away from your heart (arteries).  Low levels of HDL cholesterol. HDL cholesterol is the type of cholesterol that protects against heart disease. High levels of HDL remove the LDL buildup from arteries.  High levels of triglycerides. Triglycerides are a fatty substance in the blood that is linked to a buildup of plaques in the arteries. What are the causes? Primary dyslipidemia is caused by changes (mutations) in genes that are passed down through families (inherited). These mutations cause several types of dyslipidemia. Secondary dyslipidemia is caused by lifestyle choices and diseases that lead to dyslipidemia, such as:  Eating a diet that is high in animal fat.  Not getting enough exercise.  Having diabetes, kidney disease, liver disease, or thyroid disease.  Drinking large amounts of alcohol.  Using certain medicines. What increases the risk? You are more likely to develop this condition if you are an older man or if you are a woman who has gone through menopause. Other risk factors include:  Having a family history of dyslipidemia.  Taking certain medicines, including birth control pills, steroids, some diuretics, and  beta-blockers.  Smoking cigarettes.  Eating a high-fat diet.  Having certain medical conditions such as diabetes, polycystic ovary syndrome (PCOS), kidney disease, liver disease, or hypothyroidism.  Not exercising regularly.  Being overweight or obese with too much belly fat. What are the signs or symptoms? In most cases, dyslipidemia does not usually cause any symptoms. In severe cases, very high lipid levels can cause:  Fatty bumps under the skin (xanthomas).  White or gray ring around the black center (pupil) of the eye. Very high triglyceride levels can cause inflammation of the pancreas (pancreatitis). How is this diagnosed? Your health care provider may diagnose dyslipidemia based on a routine blood test (fasting blood test). Because most people do not have symptoms of the condition, this blood testing (lipid profile) is done on adults age 35 and older and is repeated every 5 years. This test checks:  Total cholesterol. This measures the total amount of cholesterol in your blood, including LDL cholesterol, HDL cholesterol, and triglycerides. A healthy number is below 200.  LDL cholesterol. The target number for LDL cholesterol is different for each person, depending on individual risk factors. Ask your health care provider what your LDL cholesterol should be.  HDL cholesterol. An HDL level of 60 or higher is best because it helps to protect against heart disease. A number below 92 for men or below 81 for women increases the risk for heart disease.  Triglycerides. A healthy triglyceride number is below 150. If your lipid profile is abnormal, your health care provider may do other blood tests. How is this treated? Treatment depends on the type of dyslipidemia that you have and your other risk factors for heart disease and stroke. Your health care provider  will have a target range for your lipid levels based on this information. For many people, this condition may be treated by  lifestyle changes, such as diet and exercise. Your health care provider may recommend that you:  Get regular exercise.  Make changes to your diet.  Quit smoking if you smoke. If diet changes and exercise do not help you reach your goals, your health care provider may also prescribe medicine to lower lipids. The most commonly prescribed type of medicine lowers your LDL cholesterol (statin drug). If you have a high triglyceride level, your provider may prescribe another type of drug (fibrate) or an omega-3 fish oil supplement, or both. Follow these instructions at home:  Eating and drinking  Follow instructions from your health care provider or dietitian about eating or drinking restrictions.  Eat a healthy diet as told by your health care provider. This can help you reach and maintain a healthy weight, lower your LDL cholesterol, and raise your HDL cholesterol. This may include: ? Limiting your calories, if you are overweight. ? Eating more fruits, vegetables, whole grains, fish, and lean meats. ? Limiting saturated fat, trans fat, and cholesterol.  If you drink alcohol: ? Limit how much you use. ? Be aware of how much alcohol is in your drink. In the [place] one drink equals one 12 oz bottle of beer (355 mL), one 5 oz glass of wine (148 mL), or one 1 oz glass of hard liquor (44 mL).  Do not drink alcohol if: ? Your health care provider tells you not to drink. ? You are pregnant, may be pregnant, or are planning to become pregnant. Activity  Get regular exercise. Start an exercise and strength training program as told by your health care provider. Ask your health care provider what activities are safe for you. Your health care provider may recommend: ? 30 minutes of aerobic activity 4-6 days a week. Brisk walking is an example of aerobic activity. ? Strength training 2 days a week. General instructions  Do not use any products that contain nicotine or tobacco, such as cigarettes,  e-cigarettes, and chewing tobacco. If you need help quitting, ask your health care provider.  Take over-the-counter and prescription medicines only as told by your health care provider. This includes supplements.  Keep all follow-up visits as told by your health care provider. Contact a health care provider if:  You are: ? Having trouble sticking to your exercise or diet plan. ? Struggling to quit smoking or control your use of alcohol. Summary  Dyslipidemia often involves a high level of cholesterol or triglycerides, which are types of lipids.  Treatment depends on the type of dyslipidemia that you have and your other risk factors for heart disease and stroke.  For many people, treatment starts with lifestyle changes, such as diet and exercise.  Your health care provider may prescribe medicine to lower lipids. This information is not intended to replace advice given to you by your health care provider. Make sure you discuss any questions you have with your health care provider. Document Released: 09/27/2013 Document Revised: 05/17/2018 Document Reviewed: 04/23/2018 Elsevier Patient Education  2020 [employer].  Steps to Quit Smoking Smoking tobacco is the leading cause of preventable death. It can affect almost every organ in the body. Smoking puts you and people around you at risk for many serious, long-lasting (chronic) diseases. Quitting smoking can be hard, but it is one of the best things that you can do for your health. It  is never too late to quit. How do I get ready to quit? When you decide to quit smoking, make a plan to help you succeed. Before you quit:  Pick a date to quit. Set a date within the next 2 weeks to give you time to prepare.  Write down the reasons why you are quitting. Keep this list in places where you will see it often.  Tell your family, friends, and co-workers that you are quitting. Their support is important.  Talk with your doctor about the choices  that may help you quit.  Find out if your health insurance will pay for these treatments.  Know the people, places, things, and activities that make you want to smoke (triggers). Avoid them. What first steps can I take to quit smoking?  Throw away all cigarettes at home, at work, and in your car.  Throw away the things that you use when you smoke, such as ashtrays and lighters.  Clean your car. Make sure to empty the ashtray.  Clean your home, including curtains and carpets. What can I do to help me quit smoking? Talk with your doctor about taking medicines and seeing a counselor at the same time. You are more likely to succeed when you do both.  If you are pregnant or breastfeeding, talk with your doctor about counseling or other ways to quit smoking. Do not take medicine to help you quit smoking unless your doctor tells you to do so. To quit smoking: Quit right away  Quit smoking totally, instead of slowly cutting back on how much you smoke over a period of time.  Go to counseling. You are more likely to quit if you go to counseling sessions regularly. Take medicine You may take medicines to help you quit. Some medicines need a prescription, and some you can buy over-the-counter. Some medicines may contain a drug called nicotine to replace the nicotine in cigarettes. Medicines may:  Help you to stop having the desire to smoke (cravings).  Help to stop the problems that come when you stop smoking (withdrawal symptoms). Your doctor may ask you to use:  Nicotine patches, gum, or lozenges.  Nicotine inhalers or sprays.  Non-nicotine medicine that is taken by mouth. Find resources Find resources and other ways to help you quit smoking and remain smoke-free after you quit. These resources are most helpful when you use them often. They include:  Online chats with a Social worker.  Phone quitlines.  Printed Furniture conservator/restorer.  Support groups or group counseling.  Text  messaging programs.  Mobile phone apps. Use apps on your mobile phone or tablet that can help you stick to your quit plan. There are many free apps for mobile phones and tablets as well as websites. Examples include Quit Guide from the [employer] and smokefree.gov  What things can I do to make it easier to quit?   Talk to your family and friends. Ask them to support and encourage you.  Call a phone quitline (1-800-QUIT-NOW), reach out to support groups, or work with a Social worker.  Ask people who smoke to not smoke around you.  Avoid places that make you want to smoke, such as: ? Bars. ? Parties. ? Smoke-break areas at work.  Spend time with people who do not smoke.  Lower the stress in your life. Stress can make you want to smoke. Try these things to help your stress: ? Getting regular exercise. ? Doing deep-breathing exercises. ? Doing yoga. ? Meditating. ? Doing a  body scan. To do this, close your eyes, focus on one area of your body at a time from head to toe. Notice which parts of your body are tense. Try to relax the muscles in those areas. How will I feel when I quit smoking? Day 1 to 3 weeks Within the first 24 hours, you may start to have some problems that come from quitting tobacco. These problems are very bad 2-3 days after you quit, but they do not often last for more than 2-3 weeks. You may get these symptoms:  Mood swings.  Feeling restless, nervous, angry, or annoyed.  Trouble concentrating.  Dizziness.  Strong desire for high-sugar foods and nicotine.  Weight gain.  Trouble pooping (constipation).  Feeling like you may vomit (nausea).  Coughing or a sore throat.  Changes in how the medicines that you take for other issues work in your body.  Depression.  Trouble sleeping (insomnia). Week 3 and afterward After the first 2-3 weeks of quitting, you may start to notice more positive results, such as:  Better sense of smell and taste.  Less coughing and  sore throat.  Slower heart rate.  Lower blood pressure.  Clearer skin.  Better breathing.  Fewer sick days. Quitting smoking can be hard. Do not give up if you fail the first time. Some people need to try a few times before they succeed. Do your best to stick to your quit plan, and talk with your doctor if you have any questions or concerns. Summary  Smoking tobacco is the leading cause of preventable death. Quitting smoking can be hard, but it is one of the best things that you can do for your health.  When you decide to quit smoking, make a plan to help you succeed.  Quit smoking right away, not slowly over a period of time.  When you start quitting, seek help from your doctor, family, or friends. This information is not intended to replace advice given to you by your health care provider. Make sure you discuss any questions you have with your health care provider. Document Released: 07/19/2009 Document Revised: 12/10/2018 Document Reviewed: 12/11/2018 Elsevier Patient Education  2020 [employer].

## 2019-09-15 ENCOUNTER — Other Ambulatory Visit: Payer: Self-pay | Admitting: Family Medicine

## 2019-09-15 DIAGNOSIS — R7989 Other specified abnormal findings of blood chemistry: Secondary | ICD-10-CM

## 2019-09-15 LAB — COMPREHENSIVE METABOLIC PANEL WITH GFR
ALT: 13 IU/L (ref 0–32)
AST: 18 IU/L (ref 0–40)
Albumin/Globulin Ratio: 1.8 (ref 1.2–2.2)
Albumin: 4.6 g/dL (ref 3.8–4.9)
Alkaline Phosphatase: 115 IU/L (ref 39–117)
BUN/Creatinine Ratio: 14 (ref 12–28)
BUN: 16 mg/dL (ref 8–27)
Bilirubin Total: <0.2 mg/dL (ref 0.0–1.2)
CO2: 22 mmol/L (ref 20–29)
Calcium: 10.2 mg/dL (ref 8.7–10.3)
Chloride: 103 mmol/L (ref 96–106)
Creatinine, Ser: 1.16 mg/dL — ABNORMAL HIGH (ref 0.57–1.00)
GFR calc Af Amer: 59 mL/min/1.73 — ABNORMAL LOW
GFR calc non Af Amer: 51 mL/min/1.73 — ABNORMAL LOW
Globulin, Total: 2.5 g/dL (ref 1.5–4.5)
Glucose: 184 mg/dL — ABNORMAL HIGH (ref 65–99)
Potassium: 4.5 mmol/L (ref 3.5–5.2)
Sodium: 141 mmol/L (ref 134–144)
Total Protein: 7.1 g/dL (ref 6.0–8.5)

## 2019-09-15 LAB — T4 AND TSH
T4, Total: 3 ug/dL — ABNORMAL LOW (ref 4.5–12.0)
TSH: 0.208 u[IU]/mL — ABNORMAL LOW (ref 0.450–4.500)

## 2019-09-15 NOTE — Progress Notes (Signed)
Patient ID: Ashley [name], female   DOB: [birth year], 58 y.o.   MRN: [id number]   58 year old female with abnormal thyroid lab work, low TSH and low T4.  She will be referred to endocrinology for further evaluation and treatment.

## 2019-09-18 DIAGNOSIS — M51369 Other intervertebral disc degeneration, lumbar region without mention of lumbar back pain or lower extremity pain: Secondary | ICD-10-CM | POA: Insufficient documentation

## 2019-09-18 DIAGNOSIS — M5136 Other intervertebral disc degeneration, lumbar region: Secondary | ICD-10-CM | POA: Insufficient documentation

## 2019-09-19 LAB — POCT GLYCOSYLATED HEMOGLOBIN (HGB A1C): Hemoglobin A1C: 7.8 % — AB (ref 4.0–5.6)

## 2019-09-19 LAB — GLUCOSE, POCT (MANUAL RESULT ENTRY): POC Glucose: 208 mg/dL — AB (ref 70–99)

## 2019-09-19 NOTE — Addendum Note (Signed)
Addended by: Trecia Rogers on: 09/19/2019 09:38 AM   Modules accepted: Orders

## 2019-09-25 ENCOUNTER — Emergency Department (HOSPITAL_COMMUNITY)
Admission: EM | Admit: 2019-09-25 | Discharge: 2019-09-25 | Disposition: A | Discharge status: Home / Self Care | Payer: Medicaid Other | Attending: Emergency Medicine | Admitting: Emergency Medicine

## 2019-09-25 ENCOUNTER — Other Ambulatory Visit: Payer: Self-pay

## 2019-09-25 DIAGNOSIS — Z20828 Contact with and (suspected) exposure to other viral communicable diseases: Secondary | ICD-10-CM | POA: Insufficient documentation

## 2019-09-25 DIAGNOSIS — Z5321 Procedure and treatment not carried out due to patient leaving prior to being seen by health care provider: Secondary | ICD-10-CM | POA: Insufficient documentation

## 2019-09-25 NOTE — ED Triage Notes (Signed)
Pt says she has been around her grandchild today who tested positive 2 days ago for COVID. Pt denies any symptoms.

## 2019-09-28 ENCOUNTER — Telehealth: Payer: Self-pay | Admitting: *Deleted

## 2019-09-28 NOTE — Telephone Encounter (Signed)
A message as left, re: her new patient appointment.

## 2019-10-02 ENCOUNTER — Encounter (HOSPITAL_COMMUNITY): Payer: Self-pay

## 2019-10-02 ENCOUNTER — Other Ambulatory Visit: Payer: Self-pay

## 2019-10-02 ENCOUNTER — Ambulatory Visit (HOSPITAL_COMMUNITY)
Admission: EM | Admit: 2019-10-02 | Discharge: 2019-10-02 | Disposition: A | Discharge status: Home / Self Care | Payer: Medicaid Other | Attending: Family Medicine | Admitting: Family Medicine

## 2019-10-02 DIAGNOSIS — Z20822 Contact with and (suspected) exposure to covid-19: Secondary | ICD-10-CM

## 2019-10-02 DIAGNOSIS — Z20828 Contact with and (suspected) exposure to other viral communicable diseases: Secondary | ICD-10-CM

## 2019-10-02 NOTE — Discharge Instructions (Addendum)
We will call you if your COVID results are positive Make sure that you take precautions until then.  Follow up as needed for continued or worsening symptoms

## 2019-10-02 NOTE — ED Provider Notes (Signed)
[facility]    CSN: [id number] Arrival date & time: 10/02/19  1022      History   Chief Complaint Chief Complaint  Patient presents with  . High Risk Exposure    HPI Ashley [name] is a 58 y.o. female.   Patient is a 29-year-old female past medical history of anxiety, arthritis, bipolar, depression, diabetes, GERD, hypertension.  She presents today with possible exposure to Covid.  Reporting she was around her grandchild that tested positive.  Unsure of exact date.  Currently denies any symptoms     Past Medical History:  Diagnosis Date  . Anxiety   . Arthritis   . Bipolar 1 disorder ([place])   . Depression   . Diabetes mellitus without complication ([place])    Type II  . GERD (gastroesophageal reflux disease)   . Heart murmur    "nothing to worry about"  . HTN (hypertension)    not on medication    Patient Active Problem List   Diagnosis Date Noted  . Lumbar degenerative disc disease 09/18/2019  . Acute metabolic encephalopathy [id number]  . Polysubstance abuse ([place]) 05/17/2017  . CAP (community acquired pneumonia) 05/17/2017  . Sepsis ([place]) 05/17/2017  . Acute respiratory failure with hypoxia ([place]) 05/17/2017  . Left buttock pain 10/15/2016  . Acute pain 10/15/2016  . Acute non-recurrent sinusitis 10/15/2016  . Chronic midline low back pain without sciatica 10/15/2016  . Fall 10/15/2016  . Heart murmur 08/04/2016  . Essential hypertension 08/04/2016  . Mixed dyslipidemia 08/04/2016  . History of stroke 07/02/2016  . Renal insufficiency 07/02/2016  . Bipolar affective disorder in remission ([place]) 07/02/2016  . Vaccine refused by patient 07/02/2016  . Noncompliance 07/02/2016  . Herniated lumbar intervertebral disc 04/07/2016  . 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 Cerebral infarction ([place]) 05/20/2013  . Marijuana smoker 05/20/2013  . Tobacco dependence 05/20/2013  . Syncope and collapse 05/20/2013  . Acute kidney injury ([place])  05/20/2013  . Dehydration 05/20/2013  . Hot flashes, menopausal 05/20/2013  . RBBB 05/20/2013  . Normocytic anemia 05/20/2013  . Thrombocytosis ([place]) 05/20/2013  . Depression 05/02/2013  . Colitis presumed infectious 05/01/2013  . Uncontrolled type 2 diabetes mellitus ([place]) 05/01/2013  . Hyperlipidemia 05/01/2013    Past Surgical History:  Procedure Laterality Date  . ABDOMINAL HYSTERECTOMY    . LUMBAR LAMINECTOMY/DECOMPRESSION MICRODISCECTOMY Left 04/07/2016   Procedure: Left Lumbar four-five Microdiskectomy;  Surgeon: Erline Levine, MD;  Location: [facility];  Service: Neurosurgery;  Laterality: Left;    OB History    Gravida  4   Para  3   Term  2   Preterm      AB  1   Living  4     SAB      TAB      Ectopic      Multiple  1   Live Births  4            Home Medications    Prior to Admission medications   Medication Sig Start Date End Date Taking? Authorizing Provider  insulin aspart (NOVOLOG FLEXPEN) 100 UNIT/ML FlexPen Inject 10 units before your 2 largest meals of the day. 09/14/19   Fulp, Cammie, MD  Insulin Glargine (LANTUS SOLOSTAR) 100 UNIT/ML Solostar Pen Inject 48 Units into the skin at bedtime. 09/14/19   Fulp, Cammie, MD  Insulin Pen Needle (TRUEPLUS PEN NEEDLES) 32G X 4 MM MISC Use to inject insulin. 09/14/19   Antony Blackbird, MD  QUEtiapine (SEROQUEL) 400 MG tablet TAKE 1 TABLET BY MOUTH TWICE DAILY 09/14/19   Fulp, Cammie, MD  traMADol (ULTRAM) 50 MG tablet One pill twice daily as needed for back pain 09/14/19   Antony Blackbird, MD    Family History Family History  Problem Relation Age of Onset  . Diabetes Mellitus II Father   . Diabetes Brother   . Healthy Mother   . Heart disease Neg Hx   . Stroke Neg Hx   . Kidney disease Neg Hx     Social History Social History   Tobacco Use  . Smoking status: Current Every Day Smoker    Packs/day: 1.00    Years: 39.00    Pack years: 39.00    Types: Cigarettes  . Smokeless tobacco: Never Used    . Tobacco comment: pt states that she is using the E cigs and is trying to quit  Substance Use Topics  . Alcohol use: No  . Drug use: Yes    Types: Cocaine    Comment: 04/06/16- last time     Allergies   Patient has no known allergies.   Review of Systems Review of Systems  Constitutional: Negative for chills and fever.  HENT: Negative for ear pain and sore throat.   Eyes: Negative for pain and visual disturbance.  Respiratory: Negative for cough and shortness of breath.   Cardiovascular: Negative for chest pain and palpitations.  Gastrointestinal: Negative for abdominal pain and vomiting.  Genitourinary: Negative for dysuria and hematuria.  Musculoskeletal: Negative for arthralgias and back pain.  Skin: Negative for color change and rash.  Neurological: Negative for seizures and syncope.  All other systems reviewed and are negative.    Physical Exam Triage Vital Signs ED Triage Vitals  Enc Vitals Group     BP 10/02/19 1050 113/76     Pulse Rate 10/02/19 1050 94     Resp 10/02/19 1050 17     Temp 10/02/19 1050 98.6 F (37 C)     Temp Source 10/02/19 1050 Oral     SpO2 10/02/19 1050 100 %     Weight 10/02/19 1049 164 lb (74.4 kg)     Height --      Head Circumference --      Peak Flow --      Pain Score 10/02/19 1048 0     Pain Loc --      Pain Edu? --      Excl. in [place]? --    No data found.  Updated Vital Signs BP 113/76 (BP Location: Left Arm)   Pulse 94   Temp 98.6 F (37 C) (Oral)   Resp 17   Wt 164 lb (74.4 kg)   SpO2 100%   BMI 25.69 kg/m   Visual Acuity Right Eye Distance:   Left Eye Distance:   Bilateral Distance:    Right Eye Near:   Left Eye Near:    Bilateral Near:     Physical Exam Vitals and nursing note reviewed.  Constitutional:      General: She is not in acute distress.    Appearance: Normal appearance. She is not ill-appearing, toxic-appearing or diaphoretic.  HENT:     Head: Normocephalic.     Nose: Nose normal.      Mouth/Throat:     Pharynx: Oropharynx is clear.  Eyes:     Conjunctiva/sclera: Conjunctivae normal.  Pulmonary:     Effort: Pulmonary effort is normal.  Musculoskeletal:  General: Normal range of motion.     Cervical back: Normal range of motion.  Skin:    General: Skin is warm and dry.     Findings: No rash.  Neurological:     Mental Status: She is alert.  Psychiatric:        Mood and Affect: Mood normal.      UC Treatments / Results  Labs (all labs ordered are listed, but only abnormal results are displayed) Labs Reviewed  NOVEL CORONAVIRUS, NAA (HOSP ORDER, SEND-OUT TO REF LAB; TAT 18-24 HRS)    EKG   Radiology No results found.  Procedures Procedures (including critical care time)  Medications Ordered in UC Medications - No data to display  Initial Impression / Assessment and Plan / UC Course  I have reviewed the triage vital signs and the nursing notes.  Pertinent labs & imaging results that were available during my care of the patient were reviewed by me and considered in my medical decision making (see chart for details).     Exposure to COVID-19-swab sent for testing with labs pending. Precautions given Final Clinical Impressions(s) / UC Diagnoses   Final diagnoses:  Exposure to COVID-19 virus     Discharge Instructions     We will call you if your COVID results are positive Make sure that you take precautions until then.  Follow up as needed for continued or worsening symptoms     ED Prescriptions    None     PDMP not reviewed this encounter.   Orvan July, NP 10/02/19 1121

## 2019-10-02 NOTE — ED Triage Notes (Addendum)
Pt. States her grandchild tested POSITIVE for COVID & she does NOT remember the day. Also states she doesn't remember when the last contact was. Pt. Denies ANY symptoms.

## 2019-10-03 LAB — NOVEL CORONAVIRUS, NAA (HOSP ORDER, SEND-OUT TO REF LAB; TAT 18-24 HRS): SARS-CoV-2, NAA: NOT DETECTED

## 2019-10-04 ENCOUNTER — Other Ambulatory Visit: Payer: Self-pay

## 2019-10-04 ENCOUNTER — Ambulatory Visit: Payer: Medicaid Other

## 2019-11-01 ENCOUNTER — Other Ambulatory Visit: Payer: Self-pay | Admitting: Family Medicine

## 2019-11-01 DIAGNOSIS — M5136 Other intervertebral disc degeneration, lumbar region: Secondary | ICD-10-CM

## 2019-11-01 DIAGNOSIS — G8929 Other chronic pain: Secondary | ICD-10-CM

## 2019-11-01 DIAGNOSIS — M545 Low back pain, unspecified: Secondary | ICD-10-CM

## 2019-11-01 MED FILL — traMADol HCL 50 MG TABS: 50 | 7 days supply | Qty: 14 | Fill #0

## 2019-12-22 ENCOUNTER — Other Ambulatory Visit: Payer: Self-pay

## 2019-12-22 ENCOUNTER — Ambulatory Visit
Admission: RE | Admit: 2019-12-22 | Discharge: 2019-12-22 | Disposition: A | Discharge status: Home / Self Care | Payer: 59 | Source: Ambulatory Visit | Attending: Internal Medicine | Admitting: Internal Medicine

## 2019-12-22 DIAGNOSIS — Z1231 Encounter for screening mammogram for malignant neoplasm of breast: Secondary | ICD-10-CM

## 2020-01-12 ENCOUNTER — Ambulatory Visit: Payer: 59

## 2020-01-18 ENCOUNTER — Other Ambulatory Visit: Payer: Self-pay

## 2020-01-18 ENCOUNTER — Ambulatory Visit: Payer: 59 | Attending: Family Medicine | Admitting: Physician Assistant

## 2020-01-18 VITALS — BP 131/85 | HR 93 | Temp 98.9°F | Ht 67.0 in | Wt 162.0 lb

## 2020-01-18 DIAGNOSIS — M545 Low back pain, unspecified: Secondary | ICD-10-CM

## 2020-01-18 DIAGNOSIS — M5136 Other intervertebral disc degeneration, lumbar region: Secondary | ICD-10-CM | POA: Diagnosis not present

## 2020-01-18 DIAGNOSIS — E1165 Type 2 diabetes mellitus with hyperglycemia: Secondary | ICD-10-CM

## 2020-01-18 DIAGNOSIS — G8929 Other chronic pain: Secondary | ICD-10-CM | POA: Diagnosis not present

## 2020-01-18 LAB — POCT GLYCOSYLATED HEMOGLOBIN (HGB A1C): Hemoglobin A1C: 10 % — AB (ref 4.0–5.6)

## 2020-01-18 LAB — GLUCOSE, POCT (MANUAL RESULT ENTRY): POC Glucose: 333 mg/dl — AB (ref 70–99)

## 2020-01-18 MED ORDER — GLIPIZIDE ER 10 MG PO TB24: 10.0000 mg | ORAL_TABLET | Freq: Every day | ORAL | 1 refills | Status: DC

## 2020-01-18 MED ORDER — NOVOLOG FLEXPEN 100 UNIT/ML ~~LOC~~ SOPN: PEN_INJECTOR | SUBCUTANEOUS | 3 refills | Status: DC

## 2020-01-18 MED ORDER — LANTUS SOLOSTAR 100 UNIT/ML ~~LOC~~ SOPN: 40.0000 [IU] | PEN_INJECTOR | Freq: Every day | SUBCUTANEOUS | 4 refills | Status: DC

## 2020-01-18 MED ORDER — TRUEPLUS PEN NEEDLES 32G X 4 MM MISC: 11 refills | Status: DC

## 2020-01-18 MED ORDER — TRAMADOL HCL 50 MG PO TABS: ORAL_TABLET | ORAL | 0 refills | Status: DC

## 2020-01-18 MED FILL — traMADol HCL 50 MG TABS: 50 | 10 days supply | Qty: 20 | Fill #0

## 2020-01-18 MED FILL — NOVOLOG FLEXPEN SYRINGE: 100 | 30 days supply | Qty: 6 | Fill #0

## 2020-01-18 NOTE — Patient Instructions (Signed)
Check your blood sugars fasting and bedtime and record and bring to next visit   Diabetes Basics  Diabetes (diabetes mellitus) is a long-term (chronic) disease. It occurs when the body does not properly use sugar (glucose) that is released from food after you eat. Diabetes may be caused by one or both of these problems:  Your pancreas does not make enough of a hormone called insulin.  Your body does not react in a normal way to insulin that it makes. Insulin lets sugars (glucose) go into cells in your body. This gives you energy. If you have diabetes, sugars cannot get into cells. This causes high blood sugar (hyperglycemia). Follow these instructions at home: How is diabetes treated? You may need to take insulin or other diabetes medicines daily to keep your blood sugar in balance. Take your diabetes medicines every day as told by your doctor. List your diabetes medicines here: Diabetes medicines  Name of medicine: ______________________________ ? Amount (dose): _______________ Time (a.m./p.m.): _______________ Notes: ___________________________________  Name of medicine: ______________________________ ? Amount (dose): _______________ Time (a.m./p.m.): _______________ Notes: ___________________________________  Name of medicine: ______________________________ ? Amount (dose): _______________ Time (a.m./p.m.): _______________ Notes: ___________________________________ If you use insulin, you will learn how to give yourself insulin by injection. You may need to adjust the amount based on the food that you eat. List the types of insulin you use here: Insulin  Insulin type: ______________________________ ? Amount (dose): _______________ Time (a.m./p.m.): _______________ Notes: ___________________________________  Insulin type: ______________________________ ? Amount (dose): _______________ Time (a.m./p.m.): _______________ Notes: ___________________________________  Insulin type:  ______________________________ ? Amount (dose): _______________ Time (a.m./p.m.): _______________ Notes: ___________________________________  Insulin type: ______________________________ ? Amount (dose): _______________ Time (a.m./p.m.): _______________ Notes: ___________________________________  Insulin type: ______________________________ ? Amount (dose): _______________ Time (a.m./p.m.): _______________ Notes: ___________________________________ How do I manage my blood sugar?  Check your blood sugar levels using a blood glucose monitor as directed by your doctor. Your doctor will set treatment goals for you. Generally, you should have these blood sugar levels:  Before meals (preprandial): 80-130 mg/dL (4.4-7.2 mmol/L).  After meals (postprandial): below 180 mg/dL (10 mmol/L).  A1c level: less than 7%. Write down the times that you will check your blood sugar levels: Blood sugar checks  Time: _______________ Notes: ___________________________________  Time: _______________ Notes: ___________________________________  Time: _______________ Notes: ___________________________________  Time: _______________ Notes: ___________________________________  Time: _______________ Notes: ___________________________________  Time: _______________ Notes: ___________________________________  What do I need to know about low blood sugar? Low blood sugar is called hypoglycemia. This is when blood sugar is at or below 70 mg/dL (3.9 mmol/L). Symptoms may include:  Feeling: ? Hungry. ? Worried or nervous (anxious). ? Sweaty and clammy. ? Confused. ? Dizzy. ? Sleepy. ? Sick to your stomach (nauseous).  Having: ? A fast heartbeat. ? A headache. ? A change in your vision. ? Tingling or no feeling (numbness) around the mouth, lips, or tongue. ? Jerky movements that you cannot control (seizure).  Having trouble with: ? Moving (coordination). ? Sleeping. ? Passing out  (fainting). ? Getting upset easily (irritability). Treating low blood sugar To treat low blood sugar, eat or drink something sugary right away. If you can think clearly and swallow safely, follow the 15:15 rule:  Take 15 grams of a fast-acting carb (carbohydrate). Talk with your doctor about how much you should take.  Some fast-acting carbs are: ? Sugar tablets (glucose pills). Take 3-4 glucose pills. ? 6-8 pieces of hard candy. ? 4-6 oz (120-150 mL) of fruit juice. ? 4-6 oz (  120-150 mL) of regular (not diet) soda. ? 1 Tbsp (15 mL) honey or sugar.  Check your blood sugar 15 minutes after you take the carb.  If your blood sugar is still at or below 70 mg/dL (3.9 mmol/L), take 15 grams of a carb again.  If your blood sugar does not go above 70 mg/dL (3.9 mmol/L) after 3 tries, get help right away.  After your blood sugar goes back to normal, eat a meal or a snack within 1 hour. Treating very low blood sugar If your blood sugar is at or below 54 mg/dL (3 mmol/L), you have very low blood sugar (severe hypoglycemia). This is an emergency. Do not wait to see if the symptoms will go away. Get medical help right away. Call your local emergency services (911 in the [place] Do not drive yourself to the hospital. Questions to ask your health care provider  Do I need to meet with a diabetes educator?  What equipment will I need to care for myself at home?  What diabetes medicines do I need? When should I take them?  How often do I need to check my blood sugar?  What number can I call if I have questions?  When is my next doctor's visit?  Where can I find a support group for people with diabetes? Where to find more information  American Diabetes Association: www.diabetes.org  American Association of Diabetes Educators: www.diabeteseducator.org/patient-resources Contact a doctor if:  Your blood sugar is at or above 240 mg/dL (13.3 mmol/L) for 2 days in a row.  You have been sick or  have had a fever for 2 days or more, and you are not getting better.  You have any of these problems for more than 6 hours: ? You cannot eat or drink. ? You feel sick to your stomach (nauseous). ? You throw up (vomit). ? You have watery poop (diarrhea). Get help right away if:  Your blood sugar is lower than 54 mg/dL (3 mmol/L).  You get confused.  You have trouble: ? Thinking clearly. ? Breathing. Summary  Diabetes (diabetes mellitus) is a long-term (chronic) disease. It occurs when the body does not properly use sugar (glucose) that is released from food after digestion.  Take insulin and diabetes medicines as told.  Check your blood sugar every day, as often as told.  Keep all follow-up visits as told by your doctor. This is important. This information is not intended to replace advice given to you by your health care provider. Make sure you discuss any questions you have with your health care provider. Document Revised: 06/15/2019 Document Reviewed: 12/25/2017 Elsevier Patient Education  [place].

## 2020-01-18 NOTE — Progress Notes (Signed)
Patient ID: Ashley [name], female   DOB: Oct 26, 1960, 59 y.o.   MRN: [id number]   Kirsha Vanliere, is a 59 y.o. female  [id number]  [id number]  DOB - 05/10/1961  Subjective:  Chief Complaint and HPI: Ashley [name] is a 59 y.o. female here today for med rf and back pain.  Says Dr Chapman Fitch usu gives her #60 tramadol.  Says she needs to have back surgery again.  Says she has been out of her diabetes meds for about a week(I suspect longer).  She is not checking her blood sugars and does not eat diabetic diet.  She has been eating a lot of cakes and ice cream   ROS:   Constitutional:  No f/c, No night sweats, No unexplained weight loss. EENT:  No vision changes, No blurry vision, No hearing changes. No mouth, throat, or ear problems.  Respiratory: No cough, No SOB Cardiac: No CP, no palpitations GI:  No abd pain, No N/V/D. GU: No Urinary s/sx Musculoskeletal: ongoing back pain Neuro: No headache, no dizziness, no motor weakness.  Skin: No rash Endocrine:  No polydipsia. No polyuria.  Psych: Denies SI/HI  No problems updated.  ALLERGIES: No Known Allergies  PAST MEDICAL HISTORY: Past Medical History:  Diagnosis Date  . Anxiety   . Arthritis   . Bipolar 1 disorder ([place])   . Depression   . Diabetes mellitus without complication ([place])    Type II  . GERD (gastroesophageal reflux disease)   . Heart murmur    "nothing to worry about"  . HTN (hypertension)    not on medication    MEDICATIONS AT HOME: Prior to Admission medications   Medication Sig Start Date End Date Taking? Authorizing Provider  glipiZIDE (GLUCOTROL XL) 10 MG 24 hr tablet Take 1 tablet (10 mg total) by mouth daily. 01/18/20   Argentina Donovan, PA-C  insulin aspart (NOVOLOG FLEXPEN) 100 UNIT/ML FlexPen Inject 10 units before your 2 largest meals of the day. 01/18/20   Argentina Donovan, PA-C  insulin glargine (LANTUS SOLOSTAR) 100 UNIT/ML Solostar Pen Inject 40 Units into the skin at bedtime. 01/18/20    Argentina Donovan, PA-C  Insulin Pen Needle (TRUEPLUS PEN NEEDLES) 32G X 4 MM MISC Use to inject insulin. 01/18/20   Argentina Donovan, PA-C  QUEtiapine (SEROQUEL) 400 MG tablet TAKE 1 TABLET BY MOUTH TWICE DAILY 09/14/19   Fulp, Cammie, MD  traMADol (ULTRAM) 50 MG tablet TAKE 1 TABLET BY MOUTH TWICE DAILY AS NEEDED FOR BACK PAIN 01/18/20   Argentina Donovan, PA-C     Objective:  EXAM:   Vitals:   01/18/20 1456  BP: 131/85  Pulse: 93  Temp: 98.9 F (37.2 C)  TempSrc: Oral  SpO2: 97%  Weight: 162 lb (73.5 kg)  Height: 5\' 7"  (1.702 m)    General appearance : A&OX3. NAD. Non-toxic-appearing HEENT: Atraumatic and Normocephalic.  PERRLA. EOM intact.  Chest/Lungs:  Breathing-non-labored, Good air entry bilaterally, breath sounds normal without rales, rhonchi, or wheezing  CVS: S1 S2 regular, no murmurs, gallops, rubs  Extremities: Bilateral Lower Ext shows no edema, both legs are warm to touch with = pulse throughout Neurology:  CN II-XII grossly intact, Non focal.   Psych:  TP linear. J/I fair. Normal speech. Appropriate eye contact and affect.  Skin:  No Rash  Data Review Lab Results  Component Value Date   HGBA1C 10.0 (A) 01/18/2020   HGBA1C 7.8 (A) 09/19/2019   HGBA1C 9.0 (A) 04/22/2019  Assessment & Plan   1. Uncontrolled type 2 diabetes mellitus with hyperglycemia (HCC) Suspect non-compliance-resume meds.  Check blood sugars bid and record and f/up 3 weeks with [name] - Glucose (CBG) - HgB A1c - insulin glargine (LANTUS SOLOSTAR) 100 UNIT/ML Solostar Pen; Inject 40 Units into the skin at bedtime.  Dispense: 12 mL; Refill: 4  (she says before she ran out, she was taking 34 untis on lantus-but her Rx said 48-I will restart her at 40) - Insulin Pen Needle (TRUEPLUS PEN NEEDLES) 32G X 4 MM MISC; Use to inject insulin.  Dispense: 100 each; Refill: 11 - insulin aspart (NOVOLOG FLEXPEN) 100 UNIT/ML FlexPen; Inject 10 units before your 2 largest meals of the day.  Dispense: 6  mL; Refill: 3  2. Lumbar degenerative disc disease See Dr Chapman Fitch to determine if pain management contract is appropriate.  I will give her a few for now. - traMADol (ULTRAM) 50 MG tablet; TAKE 1 TABLET BY MOUTH TWICE DAILY AS NEEDED FOR BACK PAIN  Dispense: 20 tablet; Refill: 0  3. Chronic midline low back pain without sciatica - traMADol (ULTRAM) 50 MG tablet; TAKE 1 TABLET BY MOUTH TWICE DAILY AS NEEDED FOR BACK PAIN  Dispense: 20 tablet; Refill: 0   Patient have been counseled extensively about nutrition and exercise  Return in about 3 months (around 04/18/2020) for with PCP or sooner if needed to discuss pain management contract.  The patient was given clear instructions to go to ER or return to medical center if symptoms don't improve, worsen or new problems develop. The patient verbalized understanding. The patient was told to call to get lab results if they haven't heard anything in the next week.     Freeman Caldron, PA-C [facility] [place], [place]   01/18/2020, 4:11 PM

## 2020-01-18 NOTE — Progress Notes (Signed)
Med refills

## 2020-04-03 ENCOUNTER — Telehealth: Payer: Self-pay | Admitting: Family Medicine

## 2020-04-03 MED ORDER — GLIPIZIDE ER 10 MG PO TB24: 10.0000 mg | ORAL_TABLET | Freq: Every day | ORAL | 0 refills | Status: DC

## 2020-04-03 NOTE — Telephone Encounter (Signed)
Pt states that she needs a refill for glipizide 10 mg asap. Has none left

## 2020-04-03 NOTE — Telephone Encounter (Signed)
Rx sent 

## 2020-04-04 MED FILL — glipiZIDE XL 10 MG TB24: 10 | 30 days supply | Qty: 30 | Fill #0

## 2020-04-18 ENCOUNTER — Ambulatory Visit: Payer: 59 | Admitting: Family Medicine

## 2020-05-08 ENCOUNTER — Encounter: Payer: Self-pay | Admitting: Nurse Practitioner

## 2020-05-08 ENCOUNTER — Ambulatory Visit: Payer: 59 | Attending: Family Medicine | Admitting: Nurse Practitioner

## 2020-05-08 ENCOUNTER — Other Ambulatory Visit: Payer: Self-pay

## 2020-05-08 ENCOUNTER — Other Ambulatory Visit: Payer: Self-pay | Admitting: Nurse Practitioner

## 2020-05-08 DIAGNOSIS — Z794 Long term (current) use of insulin: Secondary | ICD-10-CM

## 2020-05-08 DIAGNOSIS — R7989 Other specified abnormal findings of blood chemistry: Secondary | ICD-10-CM

## 2020-05-08 DIAGNOSIS — I10 Essential (primary) hypertension: Secondary | ICD-10-CM | POA: Diagnosis not present

## 2020-05-08 DIAGNOSIS — E1165 Type 2 diabetes mellitus with hyperglycemia: Secondary | ICD-10-CM | POA: Diagnosis not present

## 2020-05-08 DIAGNOSIS — Z1211 Encounter for screening for malignant neoplasm of colon: Secondary | ICD-10-CM | POA: Diagnosis not present

## 2020-05-08 DIAGNOSIS — D473 Essential (hemorrhagic) thrombocythemia: Secondary | ICD-10-CM

## 2020-05-08 DIAGNOSIS — E559 Vitamin D deficiency, unspecified: Secondary | ICD-10-CM

## 2020-05-08 DIAGNOSIS — E785 Hyperlipidemia, unspecified: Secondary | ICD-10-CM

## 2020-05-08 DIAGNOSIS — D75839 Thrombocytosis, unspecified: Secondary | ICD-10-CM

## 2020-05-08 MED ORDER — ATORVASTATIN CALCIUM 40 MG PO TABS: 40.0000 mg | ORAL_TABLET | Freq: Every day | ORAL | 3 refills | Status: DC

## 2020-05-08 MED ORDER — NOVOLOG FLEXPEN 100 UNIT/ML ~~LOC~~ SOPN: PEN_INJECTOR | SUBCUTANEOUS | 3 refills | Status: DC

## 2020-05-08 MED ORDER — GLIPIZIDE ER 10 MG PO TB24: 10.0000 mg | ORAL_TABLET | Freq: Every day | ORAL | 0 refills | Status: DC

## 2020-05-08 MED ORDER — TRUEPLUS PEN NEEDLES 32G X 4 MM MISC: 11 refills | Status: DC

## 2020-05-08 MED ORDER — LANTUS SOLOSTAR 100 UNIT/ML ~~LOC~~ SOPN: 40.0000 [IU] | PEN_INJECTOR | Freq: Every day | SUBCUTANEOUS | 4 refills | Status: DC

## 2020-05-08 MED ORDER — LISINOPRIL 2.5 MG PO TABS: 2.5000 mg | ORAL_TABLET | Freq: Every day | ORAL | 0 refills | Status: DC

## 2020-05-08 MED FILL — glipiZIDE XL 10 MG TB24: 10 | 30 days supply | Qty: 30 | Fill #0

## 2020-05-08 NOTE — Progress Notes (Signed)
Virtual Visit via Telephone Note Due to national recommendations of social distancing due to [place] 19, telehealth visit is felt to be most appropriate for this patient at this time.  I discussed the limitations, risks, security and privacy concerns of performing an evaluation and management service by telephone and the availability of in person appointments. I also discussed with the patient that there may be a patient responsible charge related to this service. The patient expressed understanding and agreed to proceed.    I connected with Ashley [name] on 05/08/20  at  10:30 AM EDT  EDT by telephone and verified that I am speaking with the correct person using two identifiers.   Consent I discussed the limitations, risks, security and privacy concerns of performing an evaluation and management service by telephone and the availability of in person appointments. I also discussed with the patient that there may be a patient responsible charge related to this service. The patient expressed understanding and agreed to proceed.   Location of Patient: She is riding in a car and trying to assist with the navigation app on her phone while she is talking to me here in the office. Phone call was somewhat rushed and the audio was poor during the call.    Location of Provider: [facility] [employer] [facility]    Persons participating in Telemedicine visit: Ashley Rankins FNP-BC [place]    History of Present Illness: Telemedicine visit for: F/U   DM TYPE 2 Fasting reading yesterday 194. She does not check her post prandial readings. I recommended she monitor her blood glucose levels twice per day fasting and after meals.She is not dietary adherent. Can not recall what her blood glucose goals should be fasting or post meals. She does not use her novolog pen or lantus pen daily as prescribed. Diabetes is poorly controlled. Added statin today.  LDL not at goal. Also low  dose ACE was added today. Only taking glipizide 10 mg daily which she has been out of.  Lab Results  Component Value Date   HGBA1C 10.0 (A) 01/18/2020   Lab Results  Component Value Date   CHOL 373 (H) 04/22/2019   CHOL 339 (H) 09/20/2018   CHOL 330 (H) 07/02/2016   Lab Results  Component Value Date   HDL 30 (L) 04/22/2019   HDL 39 (L) 09/20/2018   HDL 31 (L) 07/02/2016   Lab Results  Component Value Date   LDLCALC Comment 04/22/2019   [place] Comment 09/20/2018   LDLCALC NOT CALC 07/02/2016   Lab Results  Component Value Date   TRIG 890 ([place]) 04/22/2019   TRIG 456 (H) 09/20/2018   TRIG 516 (H) 07/02/2016   Lab Results  Component Value Date   CHOLHDL 12.4 (H) 04/22/2019   CHOLHDL 8.7 (H) 09/20/2018   CHOLHDL 10.6 (H) 07/02/2016   Essential Hypertension Currently not on any medication for this. Low renal dose ACE was added today. Denies chest pain, shortness of breath, palpitations, lightheadedness, dizziness, headaches or BLE edema.  BP Readings from Last 3 Encounters:  01/18/20 131/85  10/02/19 113/76  09/25/19 129/85      Past Medical History:  Diagnosis Date  . Anxiety   . Arthritis   . Bipolar 1 disorder ([place])   . Depression   . Diabetes mellitus without complication ([place])    Type II  . GERD (gastroesophageal reflux disease)   . Heart murmur    "nothing to worry about"  . HTN (  hypertension)    not on medication    Past Surgical History:  Procedure Laterality Date  . ABDOMINAL HYSTERECTOMY    . LUMBAR LAMINECTOMY/DECOMPRESSION MICRODISCECTOMY Left 04/07/2016   Procedure: Left Lumbar four-five Microdiskectomy;  Surgeon: Erline Levine, MD;  Location: [facility];  Service: Neurosurgery;  Laterality: Left;    Family History  Problem Relation Age of Onset  . Diabetes Mellitus II Father   . Diabetes Brother   . Healthy Mother   . Heart disease Neg Hx   . Stroke Neg Hx   . Kidney disease Neg Hx     Social History   Socioeconomic History  .  Marital status: Divorced    Spouse name: Not on file  . Number of children: Not on file  . Years of education: Not on file  . Highest education level: Not on file  Occupational History  . Not on file  Tobacco Use  . Smoking status: Current Every Day Smoker    Packs/day: 1.00    Years: 39.00    Pack years: 39.00    Types: Cigarettes  . Smokeless tobacco: Never Used  . Tobacco comment: pt states that she is using the E cigs and is trying to quit  Vaping Use  . Vaping Use: Never used  Substance and Sexual Activity  . Alcohol use: No  . Drug use: Yes    Types: Cocaine    Comment: 04/06/16- last time  . Sexual activity: Yes    Birth control/protection: Surgical  Other Topics Concern  . Not on file  Social History Narrative   Lives alone in an apartment.  Drives.  Does not work, disabled due to bipolar.    Social Determinants of Health   Financial Resource Strain:   . Difficulty of Paying Living Expenses:   Food Insecurity:   . Worried About Charity fundraiser in the Last Year:   . Arboriculturist in the Last Year:   Transportation Needs:   . Film/video editor (Medical):   Marland Kitchen Lack of Transportation (Non-Medical):   Physical Activity:   . Days of Exercise per Week:   . Minutes of Exercise per Session:   Stress:   . Feeling of Stress :   Social Connections:   . Frequency of Communication with Friends and Family:   . Frequency of Social Gatherings with Friends and Family:   . Attends Religious Services:   . Active Member of Clubs or Organizations:   . Attends Archivist Meetings:   Marland Kitchen Marital Status:      Observations/Objective: Awake, alert and oriented x 3   Review of Systems  Constitutional: Negative for fever, malaise/fatigue and weight loss.  HENT: Negative.  Negative for nosebleeds.   Eyes: Negative.  Negative for blurred vision, double vision and photophobia.  Respiratory: Negative.  Negative for cough and shortness of breath.   Cardiovascular:  Negative.  Negative for chest pain, palpitations and leg swelling.  Gastrointestinal: Negative.  Negative for heartburn, nausea and vomiting.  Musculoskeletal: Negative.  Negative for myalgias.  Neurological: Negative.  Negative for dizziness, focal weakness, seizures and headaches.  Psychiatric/Behavioral: Negative.  Negative for suicidal ideas.    Assessment and Plan: Yezenia was seen today for medication refill.  Diagnoses and all orders for this visit:  Type 2 diabetes mellitus with hyperglycemia, with long-term current use of insulin (HCC) -     glipiZIDE (GLUCOTROL XL) 10 MG 24 hr tablet; Take 1 tablet (10 mg total)  by mouth daily. -     insulin aspart (NOVOLOG FLEXPEN) 100 UNIT/ML FlexPen; Inject 10 units before your 2 largest meals of the day. -     insulin glargine (LANTUS SOLOSTAR) 100 UNIT/ML Solostar Pen; Inject 40 Units into the skin at bedtime. -     Insulin Pen Needle (TRUEPLUS PEN NEEDLES) 32G X 4 MM MISC; Use to inject insulin. -     Hemoglobin A1c; Future -     CMP14+EGFR; Future -     Lipid panel; Future -     Microalbumin / creatinine urine ratio; Future -     atorvastatin (LIPITOR) 40 MG tablet; Take 1 tablet (40 mg total) by mouth daily. -     lisinopril (ZESTRIL) 2.5 MG tablet; Take 1 tablet (2.5 mg total) by mouth daily. Used to help protect kidneys due to diabetes Continue blood sugar control as discussed in office today, low carbohydrate diet, and regular physical exercise as tolerated, 150 minutes per week (30 min each day, 5 days per week, or 50 min 3 days per week). Keep blood sugar logs with fasting goal of 90-130 mg/dl, post prandial (after you eat) less than 180.  For Hypoglycemia: BS <60 and Hyperglycemia BS >400; contact the clinic ASAP. Annual eye exams and foot exams are recommended.   Essential hypertension -     CMP14+EGFR; Future Remember to bring in your blood pressure log with you for your follow up appointment.  DASH/Mediterranean Diets are  healthier choices for HTN.   Colon cancer screening -     Ambulatory referral to Gastroenterology  Thrombocytosis ([place]) -     CBC; Future  Low TSH level -     Thyroid Panel With TSH; Future  Vitamin D deficiency disease -     VITAMIN D 25 Hydroxy (Vit-D Deficiency, Fractures); Future  Dyslipidemia, goal LDL below 70 -     Microalbumin / creatinine urine ratio; Future INSTRUCTIONS: Work on a low fat, heart healthy diet and participate in regular aerobic exercise program by working out at least 150 minutes per week; 5 days a week-30 minutes per day. Avoid red meat/beef/steak,  fried foods. junk foods, sodas, sugary drinks, unhealthy snacking, alcohol and smoking.  Drink at least 80 oz of water per day and monitor your carbohydrate intake daily.       Follow Up Instructions Return in about 3 months (around 08/08/2020).     I discussed the assessment and treatment plan with the patient. The patient was provided an opportunity to ask questions and all were answered. The patient agreed with the plan and demonstrated an understanding of the instructions.   The patient was advised to call back or seek an in-person evaluation if the symptoms worsen or if the condition fails to improve as anticipated.  I provided 20 minutes of non-face-to-face time during this encounter including median intraservice time, reviewing previous notes, labs, imaging, medications and explaining diagnosis and management.  Gildardo Pounds, FNP-BC

## 2020-05-10 ENCOUNTER — Other Ambulatory Visit: Payer: Self-pay

## 2020-05-10 ENCOUNTER — Encounter: Payer: Self-pay | Admitting: Gastroenterology

## 2020-05-10 ENCOUNTER — Ambulatory Visit: Payer: 59 | Attending: Family Medicine

## 2020-05-10 DIAGNOSIS — E785 Hyperlipidemia, unspecified: Secondary | ICD-10-CM

## 2020-05-10 DIAGNOSIS — R7989 Other specified abnormal findings of blood chemistry: Secondary | ICD-10-CM

## 2020-05-10 DIAGNOSIS — I10 Essential (primary) hypertension: Secondary | ICD-10-CM

## 2020-05-10 DIAGNOSIS — Z794 Long term (current) use of insulin: Secondary | ICD-10-CM

## 2020-05-10 DIAGNOSIS — D75839 Thrombocytosis, unspecified: Secondary | ICD-10-CM

## 2020-05-10 DIAGNOSIS — E559 Vitamin D deficiency, unspecified: Secondary | ICD-10-CM

## 2020-05-11 LAB — LIPID PANEL
Chol/HDL Ratio: 8.8 ratio — ABNORMAL HIGH (ref 0.0–4.4)
Cholesterol, Total: 351 mg/dL — ABNORMAL HIGH (ref 100–199)
HDL: 40 mg/dL (ref >39)
LDL Chol Calc (NIH): 237 mg/dL — ABNORMAL HIGH (ref 0–99)
Triglycerides: 341 mg/dL — ABNORMAL HIGH (ref 0–149)
VLDL Cholesterol Cal: 74 mg/dL — ABNORMAL HIGH (ref 5–40)

## 2020-05-11 LAB — CMP14+EGFR
ALT: 11 IU/L (ref 0–32)
AST: 15 IU/L (ref 0–40)
Albumin/Globulin Ratio: 1.8 (ref 1.2–2.2)
Albumin: 4.6 g/dL (ref 3.8–4.9)
Alkaline Phosphatase: 117 IU/L (ref 48–121)
BUN/Creatinine Ratio: 12 (ref 12–28)
BUN: 12 mg/dL (ref 8–27)
Bilirubin Total: <0.2 mg/dL (ref 0.0–1.2)
CO2: 22 mmol/L (ref 20–29)
Calcium: 9.8 mg/dL (ref 8.7–10.3)
Chloride: 105 mmol/L (ref 96–106)
Creatinine, Ser: 0.99 mg/dL (ref 0.57–1.00)
GFR calc Af Amer: 72 mL/min/{1.73_m2} (ref >59)
GFR calc non Af Amer: 62 mL/min/{1.73_m2} (ref >59)
Globulin, Total: 2.6 g/dL (ref 1.5–4.5)
Glucose: 79 mg/dL (ref 65–99)
Potassium: 4.2 mmol/L (ref 3.5–5.2)
Sodium: 143 mmol/L (ref 134–144)
Total Protein: 7.2 g/dL (ref 6.0–8.5)

## 2020-05-11 LAB — THYROID PANEL WITH TSH
Free Thyroxine Index: 0.8 — ABNORMAL LOW (ref 1.2–4.9)
T3 Uptake Ratio: 27 % (ref 24–39)
T4, Total: 3 ug/dL — ABNORMAL LOW (ref 4.5–12.0)
TSH: 0.416 u[IU]/mL — ABNORMAL LOW (ref 0.450–4.500)

## 2020-05-11 LAB — CBC
Hematocrit: 40.1 % (ref 34.0–46.6)
Hemoglobin: 13.2 g/dL (ref 11.1–15.9)
MCH: 31 pg (ref 26.6–33.0)
MCHC: 32.9 g/dL (ref 31.5–35.7)
MCV: 94 fL (ref 79–97)
Platelets: 320 10*3/uL (ref 150–450)
RBC: 4.26 x10E6/uL (ref 3.77–5.28)
RDW: 13.3 % (ref 11.7–15.4)
WBC: 10 10*3/uL (ref 3.4–10.8)

## 2020-05-11 LAB — MICROALBUMIN / CREATININE URINE RATIO
Creatinine, Urine: 187.8 mg/dL
Microalb/Creat Ratio: 8 mg/g creat (ref 0–29)
Microalbumin, Urine: 14.7 ug/mL

## 2020-05-11 LAB — HEMOGLOBIN A1C
Est. average glucose Bld gHb Est-mCnc: 180 mg/dL
Hgb A1c MFr Bld: 7.9 % — ABNORMAL HIGH (ref 4.8–5.6)

## 2020-05-11 LAB — VITAMIN D 25 HYDROXY (VIT D DEFICIENCY, FRACTURES): Vit D, 25-Hydroxy: 21.8 ng/mL — ABNORMAL LOW (ref 30.0–100.0)

## 2020-05-13 ENCOUNTER — Other Ambulatory Visit: Payer: Self-pay | Admitting: Nurse Practitioner

## 2020-05-13 DIAGNOSIS — E782 Mixed hyperlipidemia: Secondary | ICD-10-CM

## 2020-05-13 DIAGNOSIS — E038 Other specified hypothyroidism: Secondary | ICD-10-CM

## 2020-05-17 ENCOUNTER — Other Ambulatory Visit: Payer: Self-pay | Admitting: Nurse Practitioner

## 2020-05-17 DIAGNOSIS — R7989 Other specified abnormal findings of blood chemistry: Secondary | ICD-10-CM

## 2020-06-06 ENCOUNTER — Ambulatory Visit: Payer: 59 | Attending: Family Medicine

## 2020-06-06 ENCOUNTER — Other Ambulatory Visit: Payer: Self-pay

## 2020-06-06 ENCOUNTER — Telehealth: Payer: Self-pay

## 2020-06-06 DIAGNOSIS — E782 Mixed hyperlipidemia: Secondary | ICD-10-CM

## 2020-06-06 DIAGNOSIS — E038 Other specified hypothyroidism: Secondary | ICD-10-CM

## 2020-06-06 NOTE — Telephone Encounter (Signed)
New message   The patient is asking for lab results to her psychiatry voiced information is on filed   Asking for a call back.

## 2020-06-07 LAB — LIPID PANEL
Chol/HDL Ratio: 8.4 ratio — ABNORMAL HIGH (ref 0.0–4.4)
Cholesterol, Total: 334 mg/dL — ABNORMAL HIGH (ref 100–199)
HDL: 40 mg/dL (ref >39)
LDL Chol Calc (NIH): 213 mg/dL — ABNORMAL HIGH (ref 0–99)
Triglycerides: 383 mg/dL — ABNORMAL HIGH (ref 0–149)
VLDL Cholesterol Cal: 81 mg/dL — ABNORMAL HIGH (ref 5–40)

## 2020-06-07 LAB — PROLACTIN: Prolactin: 1.8 ng/mL — ABNORMAL LOW (ref 4.8–23.3)

## 2020-06-08 ENCOUNTER — Ambulatory Visit: Payer: 59 | Attending: Family Medicine | Admitting: Family Medicine

## 2020-06-08 ENCOUNTER — Encounter: Payer: Self-pay | Admitting: Family Medicine

## 2020-06-08 ENCOUNTER — Other Ambulatory Visit: Payer: Self-pay

## 2020-06-08 VITALS — BP 116/81 | HR 92 | Temp 97.0°F | Resp 16 | Wt 165.0 lb

## 2020-06-08 DIAGNOSIS — E1165 Type 2 diabetes mellitus with hyperglycemia: Secondary | ICD-10-CM | POA: Diagnosis not present

## 2020-06-08 DIAGNOSIS — M545 Low back pain, unspecified: Secondary | ICD-10-CM

## 2020-06-08 DIAGNOSIS — Z794 Long term (current) use of insulin: Secondary | ICD-10-CM | POA: Diagnosis not present

## 2020-06-08 DIAGNOSIS — E782 Mixed hyperlipidemia: Secondary | ICD-10-CM

## 2020-06-08 DIAGNOSIS — F17200 Nicotine dependence, unspecified, uncomplicated: Secondary | ICD-10-CM

## 2020-06-08 DIAGNOSIS — Z79899 Other long term (current) drug therapy: Secondary | ICD-10-CM

## 2020-06-08 DIAGNOSIS — G8929 Other chronic pain: Secondary | ICD-10-CM

## 2020-06-08 DIAGNOSIS — M5136 Other intervertebral disc degeneration, lumbar region: Secondary | ICD-10-CM

## 2020-06-08 DIAGNOSIS — M51369 Other intervertebral disc degeneration, lumbar region without mention of lumbar back pain or lower extremity pain: Secondary | ICD-10-CM

## 2020-06-08 LAB — GLUCOSE, POCT (MANUAL RESULT ENTRY): POC Glucose: 295 mg/dL — AB (ref 70–99)

## 2020-06-08 MED ORDER — TRAMADOL HCL 50 MG PO TABS: ORAL_TABLET | ORAL | 2 refills | Status: DC

## 2020-06-08 MED FILL — traMADol HCL 50 MG TABS: 50 | 7 days supply | Qty: 14 | Fill #0

## 2020-06-08 NOTE — Progress Notes (Signed)
Would like a refill on Tramadol d/t back pain Hg A1C 05/2020 was 7.9 Request to increase Glipizide.  CBG- 295 after 2 cups of coffee with brown sugar.    Follow from lab visit per PCP-  VIt d is slightly below normal> you can take vit d over the counter 2000 units daily. CBC does not show anemia. Urine does not show microscopic diabetic kidney changes. Kidney, liver function and electrolytes are normal. Your thyroid levels are abnormal. Bien please ask if she is seeing endocrinology. She was referred to [facility] in December. If not She will need to be referred.  Also need additional blood work. Will need to check fasting cholesterol levels as well in 3 weeks. Make sure you are taking atorvastatin for your cholesterol every day. A1c down to 7.9. Continue all medication as prescribed. Please call the office if your start to notice blood glucose levels below 90 consistently

## 2020-06-08 NOTE — Telephone Encounter (Signed)
Pt. Had an OV.  Pt. Had to fill out a medical release form.

## 2020-06-08 NOTE — Progress Notes (Signed)
Established Patient Office Visit  Subjective:  Patient ID: Ashley [name], female    DOB: Jul 23, 1961  Age: 59 y.o. MRN: [id number]  CC:  Chief Complaint  Patient presents with  . Back Pain  . Follow-up    HPI Ashley [name], 59 yo African-American female, last seen for telemedicine visit on 05/08/2020 by another provider.  She reports that she is here in follow-up of low back pain for which she has had prior surgery x2.  Patient requests refill of tramadol.  Pain is mostly in the mid to lower back midline with occasional radiation to the left leg.  Pain ranges from 8-10 on a 0-to-10 scale.  She has had recent blood work after her recent telemedicine visit but still needs to have her lipid panel.  She reports that she is only had coffee without cream or sugar this morning.  She feels that her blood sugars have been elevated but she has had recent increased stress and has not been following her diabetic diet strictly as her daughter is in the hospital and has had recent open heart surgery.  She reports compliance with glipizide and Lantus.  She wonders if increasing her dose of glipizide would help better control her blood sugars.  Blood sugars are currently elevated in the 200s.  She would also like a copy of her recent blood work sent to her psychiatrist as she has a follow-up appointment next week.  Past Medical History:  Diagnosis Date  . Anxiety   . Arthritis   . Bipolar 1 disorder ([place])   . Depression   . Diabetes mellitus without complication ([place])    Type II  . GERD (gastroesophageal reflux disease)   . Heart murmur    "nothing to worry about"  . HTN (hypertension)    not on medication    Past Surgical History:  Procedure Laterality Date  . ABDOMINAL HYSTERECTOMY    . LUMBAR LAMINECTOMY/DECOMPRESSION MICRODISCECTOMY Left 04/07/2016   Procedure: Left Lumbar four-five Microdiskectomy;  Surgeon: Erline Levine, MD;  Location: [facility];  Service: Neurosurgery;  Laterality:  Left;    Family History  Problem Relation Age of Onset  . Diabetes Mellitus II Father   . Diabetes Brother   . Healthy Mother   . Heart disease Neg Hx   . Stroke Neg Hx   . Kidney disease Neg Hx     Social History   Socioeconomic History  . Marital status: Divorced    Spouse name: Not on file  . Number of children: Not on file  . Years of education: Not on file  . Highest education level: Not on file  Occupational History  . Not on file  Tobacco Use  . Smoking status: Current Every Day Smoker    Packs/day: 1.00    Years: 39.00    Pack years: 39.00    Types: Cigarettes  . Smokeless tobacco: Never Used  . Tobacco comment: pt states that she is using the E cigs and is trying to quit  Vaping Use  . Vaping Use: Never used  Substance and Sexual Activity  . Alcohol use: No  . Drug use: Yes    Types: Cocaine    Comment: 04/06/16- last time  . Sexual activity: Yes    Birth control/protection: Surgical  Other Topics Concern  . Not on file  Social History Narrative   Lives alone in an apartment.  Drives.  Does not work, disabled due to bipolar.    Social  Determinants of Health   Financial Resource Strain:   . Difficulty of Paying Living Expenses: Not on file  Food Insecurity:   . Worried About Charity fundraiser in the Last Year: Not on file  . Ran Out of Food in the Last Year: Not on file  Transportation Needs:   . Lack of Transportation (Medical): Not on file  . Lack of Transportation (Non-Medical): Not on file  Physical Activity:   . Days of Exercise per Week: Not on file  . Minutes of Exercise per Session: Not on file  Stress:   . Feeling of Stress : Not on file  Social Connections:   . Frequency of Communication with Friends and Family: Not on file  . Frequency of Social Gatherings with Friends and Family: Not on file  . Attends Religious Services: Not on file  . Active Member of Clubs or Organizations: Not on file  . Attends Archivist Meetings:  Not on file  . Marital Status: Not on file  Intimate Partner Violence:   . Fear of Current or Ex-Partner: Not on file  . Emotionally Abused: Not on file  . Physically Abused: Not on file  . Sexually Abused: Not on file    Outpatient Medications Prior to Visit  Medication Sig Dispense Refill  . glipiZIDE (GLUCOTROL XL) 10 MG 24 hr tablet Take 1 tablet (10 mg total) by mouth daily. 90 tablet 0  . insulin aspart (NOVOLOG FLEXPEN) 100 UNIT/ML FlexPen Inject 10 units before your 2 largest meals of the day. 6 mL 3  . insulin glargine (LANTUS SOLOSTAR) 100 UNIT/ML Solostar Pen Inject 40 Units into the skin at bedtime. 12 mL 4  . QUEtiapine (SEROQUEL) 400 MG tablet TAKE 1 TABLET BY MOUTH TWICE DAILY 60 tablet 0  . atorvastatin (LIPITOR) 40 MG tablet Take 1 tablet (40 mg total) by mouth daily. (Patient not taking: Reported on 06/08/2020) 90 tablet 3  . Insulin Pen Needle (TRUEPLUS PEN NEEDLES) 32G X 4 MM MISC Use to inject insulin. 100 each 11  . lisinopril (ZESTRIL) 2.5 MG tablet Take 1 tablet (2.5 mg total) by mouth daily. Used to help protect kidneys due to diabetes (Patient not taking: Reported on 06/08/2020) 90 tablet 0  . traMADol (ULTRAM) 50 MG tablet TAKE 1 TABLET BY MOUTH TWICE DAILY AS NEEDED FOR BACK PAIN (Patient not taking: Reported on 06/08/2020) 20 tablet 0   No facility-administered medications prior to visit.    No Known Allergies  ROS Review of Systems  Constitutional: Positive for fatigue. Negative for chills and fever.  HENT: Negative for sore throat and trouble swallowing.   Eyes: Negative for photophobia and visual disturbance.  Respiratory: Negative for cough and shortness of breath.   Cardiovascular: Negative for chest pain and palpitations.  Gastrointestinal: Negative for abdominal pain, constipation, diarrhea and nausea.  Endocrine: Negative for polydipsia, polyphagia and polyuria.  Genitourinary: Negative for dysuria and frequency.  Musculoskeletal: Positive for back  pain and gait problem.  Neurological: Negative for dizziness and headaches.  Hematological: Negative for adenopathy. Does not bruise/bleed easily.  Psychiatric/Behavioral: The patient is nervous/anxious (Having increased anxiety secondary to daughter being in hospital and status post open heart surgery).       Objective:    Physical Exam Constitutional:      General: She is not in acute distress.    Appearance: Normal appearance.  Neck:     Vascular: No carotid bruit.  Cardiovascular:     Rate and Rhythm:  Normal rate and regular rhythm.     Pulses: Normal pulses.     Heart sounds: Normal heart sounds.  Pulmonary:     Effort: Pulmonary effort is normal.     Breath sounds: Normal breath sounds.  Abdominal:     Tenderness: There is no right CVA tenderness, left CVA tenderness or guarding.  Musculoskeletal:        General: Tenderness present.     Cervical back: Normal range of motion and neck supple. No tenderness.     Right lower leg: No edema.     Left lower leg: No edema.     Comments: Tenderness from T12-S1 area and mild bilateral lumbosacral paraspinous spasm  Lymphadenopathy:     Cervical: No cervical adenopathy.  Skin:    General: Skin is warm and dry.     Comments: See screening section for DM foot exam results  Neurological:     General: No focal deficit present.     Mental Status: She is alert and oriented to person, place, and time.     Comments: Normal monofilament exam of feet  Psychiatric:        Behavior: Behavior normal.     Comments: Slightly anxious     BP 116/81   Pulse 92   Temp (!) 97 F (36.1 C)   Resp 16   Wt 165 lb (74.8 kg)   SpO2 99%   BMI 25.84 kg/m  Wt Readings from Last 3 Encounters:  06/08/20 165 lb (74.8 kg)  01/18/20 162 lb (73.5 kg)  10/02/19 164 lb (74.4 kg)     Health Maintenance Due  Topic Date Due  . COVID-19 Vaccine (1) Never done  . COLONOSCOPY  Never done  . INFLUENZA VACCINE  Never done    Patient is scheduled to  have her colonoscopy next month  Lab Results  Component Value Date   TSH 0.416 (L) 05/10/2020   Lab Results  Component Value Date   WBC 10.0 05/10/2020   HGB 13.2 05/10/2020   HCT 40.1 05/10/2020   MCV 94 05/10/2020   PLT 320 05/10/2020   Lab Results  Component Value Date   NA 143 05/10/2020   K 4.2 05/10/2020   CO2 22 05/10/2020   GLUCOSE 79 05/10/2020   BUN 12 05/10/2020   CREATININE 0.99 05/10/2020   BILITOT <0.2 05/10/2020   ALKPHOS 117 05/10/2020   AST 15 05/10/2020   ALT 11 05/10/2020   PROT 7.2 05/10/2020   ALBUMIN 4.6 05/10/2020   CALCIUM 9.8 05/10/2020   ANIONGAP 12 05/21/2017   Lab Results  Component Value Date   CHOL 334 (H) 06/06/2020   Lab Results  Component Value Date   HDL 40 06/06/2020   Lab Results  Component Value Date   LDLCALC 213 (H) 06/06/2020   Lab Results  Component Value Date   TRIG 383 (H) 06/06/2020   Lab Results  Component Value Date   CHOLHDL 8.4 (H) 06/06/2020   Lab Results  Component Value Date   HGBA1C 7.9 (H) 05/10/2020      Assessment & Plan:  1. Type 2 diabetes mellitus with hyperglycemia, with long-term current use of insulin ([place]); 2. Mixed Dyslipidemia Patient reports continued compliance with glipizide 10 mg XL once daily and Lantus 40 units daily. She reports that her diet has not been good lately secondary to increased stress. She is fasting today to have lipid panel. She also has an endocrinology follow-up appointment possibly in October- appt info will  also be on AVS given at end of today's visit. Hgb A1c of 7.9 on 05/11/2020. Continue current medications and monitoring of blood sugars as well as low carb diet and keep endocrinology follow-up. DM foot care also discussed. She will be contacted regarding recommendations for statin therapy based on today's lab results but is not currently compliant with atorvastatin 40 mg daily previously prescribed.  - Glucose (CBG) - Lipid Panel  3. Chronic bilateral low back pain  without sciatica Patient with lumbar degenerative disc disease status post surgery x2.  Patient requests refill of tramadol.  Patient declines referral to pain management or physical therapy at this time.  Tramadol refill provided  4. Encounter for long-term (current) use of medications Patient has had recent blood work done on 02/23/2020 which was reviewed and patient with normal comprehensive metabolic panel, and normal CBC.  5. Tobacco dependence Patient continue be contacted by the clinical pharmacist to arrange follow-up appointment regarding smoking cessation.  Patient reports that her daughter is currently in the hospital status post open heart surgery and patient would like to try and stop smoking to be supportive of her daughter who also has to stop smoking.  At today's visit, patient did not feel that she was quite ready to stop smoking or interested in medication to help with smoking cessation due to her current increased stress with her daughter in the hospital.  Patient does have follow-up with her psychiatrist later this month.  6. Lumbar degenerative disc disease; chronic back pain Refills provided 50 mg to take twice daily for tramadol to take twice daily as needed for back pain.  Patient declined referral to physical medicine/physical therapy and pain management at today's visit. - traMADol (ULTRAM) 50 MG tablet; TAKE 1 TABLET BY MOUTH TWICE DAILY AS NEEDED FOR BACK PAIN  Dispense: 60 tablet; Refill: 2     Follow-up: Return in about 4 months (around 10/08/2020) for chronic issues and as needed; have [facility] call patient regarding smoking cessation.    Antony Blackbird, MD

## 2020-06-09 LAB — LIPID PANEL
Chol/HDL Ratio: 8.2 ratio — ABNORMAL HIGH (ref 0.0–4.4)
Cholesterol, Total: 344 mg/dL — ABNORMAL HIGH (ref 100–199)
HDL: 42 mg/dL
LDL Chol Calc (NIH): 205 mg/dL — ABNORMAL HIGH (ref 0–99)
Triglycerides: 456 mg/dL — ABNORMAL HIGH (ref 0–149)
VLDL Cholesterol Cal: 97 mg/dL — ABNORMAL HIGH (ref 5–40)

## 2020-06-10 ENCOUNTER — Encounter: Payer: Self-pay | Admitting: Family Medicine

## 2020-06-10 ENCOUNTER — Other Ambulatory Visit: Payer: Self-pay | Admitting: Family Medicine

## 2020-06-10 DIAGNOSIS — E1165 Type 2 diabetes mellitus with hyperglycemia: Secondary | ICD-10-CM

## 2020-06-10 DIAGNOSIS — E782 Mixed hyperlipidemia: Secondary | ICD-10-CM

## 2020-06-10 MED ORDER — ROSUVASTATIN CALCIUM 20 MG PO TABS: 20.0000 mg | ORAL_TABLET | Freq: Every day | ORAL | 3 refills | Status: DC

## 2020-07-02 MED FILL — ROSUVASTATIN CALCIUM 20 MG: 20 | 30 days supply | Qty: 30 | Fill #0

## 2020-07-02 MED FILL — glipiZIDE XL 10 MG TB24: 10 | 30 days supply | Qty: 30 | Fill #1

## 2020-07-03 ENCOUNTER — Ambulatory Visit: Payer: 59 | Admitting: Pharmacist

## 2020-07-10 ENCOUNTER — Encounter: Payer: 59 | Admitting: Gastroenterology

## 2020-08-09 NOTE — Progress Notes (Deleted)
Name: Ashley [name]  MRN/ DOB: [id number], 1960-11-12    Age/ Sex: 59 y.o., female    PCP: Antony Blackbird, MD   Reason for Endocrinology Evaluation: Subclinical hyperthyroidism      Date of Initial Endocrinology Evaluation: 08/10/2020     HPI: Ashley [name] is a 59 y.o. female with a past medical history of T2DM and Dyslipidemia. The patient presented for initial endocrinology clinic visit on 08/10/2020 for consultative assistance with her subclinical hyperthyroidism .   Pt has been noted with low TSH since 2018 but with normal T4      DIABETES HISTORY:    HISTORY:  Past Medical History:  Past Medical History:  Diagnosis Date  . Anxiety   . Arthritis   . Bipolar 1 disorder ([place])   . Depression   . Diabetes mellitus without complication ([place])    Type II  . GERD (gastroesophageal reflux disease)   . Heart murmur    "nothing to worry about"  . HTN (hypertension)    not on medication   Past Surgical History:  Past Surgical History:  Procedure Laterality Date  . ABDOMINAL HYSTERECTOMY    . LUMBAR LAMINECTOMY/DECOMPRESSION MICRODISCECTOMY Left 04/07/2016   Procedure: Left Lumbar four-five Microdiskectomy;  Surgeon: Erline Levine, MD;  Location: [facility];  Service: Neurosurgery;  Laterality: Left;      Social History:  reports that she has been smoking cigarettes. She has a 39.00 pack-year smoking history. She has never used smokeless tobacco. She reports current drug use. Drug: Cocaine. She reports that she does not drink alcohol.  Family History: family history includes Diabetes in her brother; Diabetes Mellitus II in her father; Healthy in her mother.   HOME MEDICATIONS: Allergies as of 08/10/2020   No Known Allergies     Medication List       Accurate as of August 10, 2020  7:30 AM. If you have any questions, ask your nurse or doctor.        glipiZIDE 10 MG 24 hr tablet Commonly known as: GLUCOTROL XL Take 1 tablet (10 mg total) by mouth  daily.   Lantus SoloStar 100 UNIT/ML Solostar Pen Generic drug: insulin glargine Inject 40 Units into the skin at bedtime.   lisinopril 2.5 MG tablet Commonly known as: ZESTRIL Take 1 tablet (2.5 mg total) by mouth daily. Used to help protect kidneys due to diabetes   NovoLOG FlexPen 100 UNIT/ML FlexPen Generic drug: insulin aspart Inject 10 units before your 2 largest meals of the day.   QUEtiapine 400 MG tablet Commonly known as: SEROQUEL TAKE 1 TABLET BY MOUTH TWICE DAILY   rosuvastatin 20 MG tablet Commonly known as: Crestor Take 1 tablet (20 mg total) by mouth daily. To lower cholesterol   traMADol 50 MG tablet Commonly known as: ULTRAM TAKE 1 TABLET BY MOUTH TWICE DAILY AS NEEDED FOR BACK PAIN   TRUEplus Pen Needles 32G X 4 MM Misc Generic drug: Insulin Pen Needle Use to inject insulin.         REVIEW OF SYSTEMS: A comprehensive ROS was conducted with the patient and is negative except as per HPI and below:  ROS     OBJECTIVE:  VS: There were no vitals taken for this visit.   Wt Readings from Last 3 Encounters:  06/08/20 165 lb (74.8 kg)  01/18/20 162 lb (73.5 kg)  10/02/19 164 lb (74.4 kg)     EXAM: General: Pt appears well and is in NAD  Hydration: Well-hydrated with moist mucous membranes and good skin turgor  Eyes: External eye exam normal without stare, lid lag or exophthalmos.  EOM intact.  PERRL.  Ears, Nose, Throat: Hearing: Grossly intact bilaterally Dental: Good dentition  Throat: Clear without mass, erythema or exudate  Neck: General: Supple without adenopathy. Thyroid: Thyroid size normal.  No goiter or nodules appreciated. No thyroid bruit.  Lungs: Clear with good BS bilat with no rales, rhonchi, or wheezes  Heart: Auscultation: RRR.  Abdomen: Normoactive bowel sounds, soft, nontender, without masses or organomegaly palpable  Extremities: Gait and station: Normal gait  Digits and nails: No clubbing, cyanosis, petechiae, or nodes Head  and neck: Normal alignment and mobility BL UE: Normal ROM and strength. BL LE: No pretibial edema normal ROM and strength.  Skin: Hair: Texture and amount normal with gender appropriate distribution Skin Inspection: No rashes, acanthosis nigricans/skin tags. No lipohypertrophy Skin Palpation: Skin temperature, texture, and thickness normal to palpation  Neuro: Cranial nerves: II - XII grossly intact  Cerebellar: Normal coordination and movement; no tremor Motor: Normal strength throughout DTRs: 2+ and symmetric in UE without delay in relaxation phase  Mental Status: Judgment, insight: Intact Orientation: Oriented to time, place, and person Memory: Intact for recent and remote events Mood and affect: No depression, anxiety, or agitation     DATA REVIEWED: ***    ASSESSMENT/PLAN/RECOMMENDATIONS:   1. Subclinical hyperthyroidism:    Medications :  Signed electronically by: Mack Guise, MD  [facility] Endocrinology  [facility] [address] [place], [place] [address] Phone: [phone] FAX: [phone]   CC: Antony Blackbird, MD [place] [address] Phone: [phone] Fax: [phone]   Return to Endocrinology clinic as below: Future Appointments  Date Time Provider [place]  08/10/2020 10:30 AM Kelli Robeck, Melanie Crazier, MD [facility] None

## 2020-08-10 ENCOUNTER — Ambulatory Visit: Payer: 59 | Admitting: Internal Medicine

## 2020-08-13 MED FILL — glipiZIDE XL 10 MG TB24: 10 | 30 days supply | Qty: 30 | Fill #1

## 2020-08-21 ENCOUNTER — Encounter: Payer: Self-pay | Admitting: Pharmacist

## 2020-08-21 ENCOUNTER — Other Ambulatory Visit: Payer: Self-pay

## 2020-08-21 ENCOUNTER — Ambulatory Visit: Payer: 59 | Attending: Family Medicine | Admitting: Pharmacist

## 2020-08-21 ENCOUNTER — Other Ambulatory Visit: Payer: Self-pay | Admitting: Family Medicine

## 2020-08-21 DIAGNOSIS — Z716 Tobacco abuse counseling: Secondary | ICD-10-CM

## 2020-08-21 MED ORDER — NICOTINE 21 MG/24HR TD PT24: 21.0000 mg | MEDICATED_PATCH | Freq: Every day | TRANSDERMAL | 1 refills | Status: DC

## 2020-08-21 MED ORDER — CYCLOBENZAPRINE HCL 10 MG PO TABS: 5.0000 mg | ORAL_TABLET | Freq: Two times a day (BID) | ORAL | 2 refills | Status: DC | PRN

## 2020-08-21 MED FILL — NICOTINE 21 MG/24HR PATCH: 21 | 28 days supply | Qty: 28 | Fill #0

## 2020-08-21 MED FILL — CYCLOBENZAPRINE 10 MG TAB: 10 | 30 days supply | Qty: 30 | Fill #0

## 2020-08-21 MED FILL — traMADol HCL 50 MG TABS: 50 | 7 days supply | Qty: 14 | Fill #1

## 2020-08-21 NOTE — Progress Notes (Addendum)
S:  Patient was reviewed by clinical pharmacist for assistance with tobacco cessation.   Tobacco Use History  Age when started using tobacco on a daily basis 6.  Type: cigarettes.  Number of cigarettes per day: 20, brand: LD.  Estimated nicotine content: 14 mg per day.   Smokes first cigarette within 30 minutes after waking.  Does wake at night to smoke- some nights, but not all  Fagerstrom Score 6/10  Triggers include emotional (anger), driving  Quit Attempt History   Most recent quit attempt earlier this year.  Longest time ever been tobacco free: 1 week.  Methods tried in the past include nicotine patches and Chantix, but reports that nothing works.  Rates IMPORTANCE of quitting tobacco on 1-10 scale of 10.  Rates READINESS of quitting tobacco on 1-10 scale of 10.  Rates CONFIDENCE of quitting tobacco on 1-10 scale of 1.  Motivators to quitting include none besides herself; barriers include friends smoke , confidence  Struggles with cravings   A/P: Nicotine dependence: moderate-to-severe , 45 years duration in a patient who is fair candidate for success because of high readiness & importance, but low confidence.   Reviewed options and patient reports she would like to try nicotine patches today. Initiated Nicotine Patch 21 mg daily. Treatment was reviewed with the patient, including name, instructions, goals of therapy, potential side effects, importance of adherence, and safe use. Consider bupropion in the future if PCP & psychiatrist approve. Chantix unavailable at this time.   Reviewed potential challenges and coping skills/strategies with patient. Provided information on 1 800-QUIT NOW support program and advised patient to contact me if questions/concerns arise. Patient verbalized understanding of information by repeating back.  Follow up in 1 week(s) by phone  Beckey Rutter, PharmD Candidate [facility] [employer] [facility], Class of 2022  [place],  PharmD, [place] [phone]

## 2020-08-21 NOTE — Progress Notes (Deleted)
S:  Patient was reviewed by clinical pharmacist for assistance with tobacco cessation. Patient identity was verified using date of birth and address.  Tobacco Use History  Age when started using tobacco on a daily basis ***.  Type: {Nicotine:3044014::"cigarettes","cigar","pipe","electronic cigarettes","snus"}.  Number of cigarettes per day ***, brand ***.  Estimated nicotine content: *** mg per day.   Smokes first cigarette *** minutes after waking.  {Does/does not:3044014::"Does","Does not"} wake at night to smoke  Fagerstrom Score ***/10.  Triggers include {hx nicotine triggers:311123}.  Quit Attempt History   Most recent quit attempt ***.  Longest time ever been tobacco free ***.  Methods tried in the past include {CHL AMB PCMH MEDICATIONS FOR SMOKING CESSATION:20759}.   Rates IMPORTANCE of quitting tobacco on 1-10 scale of ***.  Rates READINESS of quitting tobacco on 1-10 scale of ***.  Rates CONFIDENCE of quitting tobacco on 1-10 scale of ***.  Motivators to quitting include ***; barriers include {smoking cessation barriers:18118}   A/P: Nicotine dependence: {DESC; MILD/MOD/SEVERE:15682} , *** years duration in a patient who is {excellent/good/fair/poor:19665} candidate for success b/c of ***.     Reviewed options and patient reports ***. Initiated {CHL AMB PCMH MEDICATIONS FOR SMOKING CESSATION:20759}. Treatment was reviewed with the patient, including name, instructions, goals of therapy, potential side effects, importance of adherence, and safe use.  Reviewed potential challenges and coping skills/strategies with patient. Provided information on 1 800-QUIT NOW support program and advised patient to contact me if questions/concerns arise. Patient verbalized understanding of information by repeating back.  Follow up {follow up:15908}

## 2020-08-29 ENCOUNTER — Ambulatory Visit: Payer: 59 | Admitting: Family Medicine

## 2020-09-10 ENCOUNTER — Ambulatory Visit: Payer: 59 | Admitting: Family

## 2020-09-10 MED FILL — glipiZIDE XL 10 MG TB24: 10 | 30 days supply | Qty: 30 | Fill #2

## 2020-09-10 MED FILL — traMADol HCL 50 MG TABS: 50 | 7 days supply | Qty: 14 | Fill #2

## 2020-10-03 ENCOUNTER — Ambulatory Visit: Payer: 59 | Admitting: Family Medicine

## 2020-10-10 ENCOUNTER — Ambulatory Visit: Payer: 59 | Admitting: Family Medicine

## 2020-10-19 MED FILL — traMADol HCL 50 MG TABS: 50 | 7 days supply | Qty: 14 | Fill #0

## 2020-10-31 ENCOUNTER — Encounter: Payer: Self-pay | Admitting: Critical Care Medicine

## 2020-10-31 ENCOUNTER — Other Ambulatory Visit: Payer: Self-pay | Admitting: Critical Care Medicine

## 2020-10-31 ENCOUNTER — Ambulatory Visit: Payer: 59 | Attending: Family Medicine | Admitting: Critical Care Medicine

## 2020-10-31 ENCOUNTER — Other Ambulatory Visit: Payer: Self-pay

## 2020-10-31 DIAGNOSIS — E559 Vitamin D deficiency, unspecified: Secondary | ICD-10-CM | POA: Insufficient documentation

## 2020-10-31 DIAGNOSIS — E1165 Type 2 diabetes mellitus with hyperglycemia: Secondary | ICD-10-CM | POA: Diagnosis not present

## 2020-10-31 DIAGNOSIS — E118 Type 2 diabetes mellitus with unspecified complications: Secondary | ICD-10-CM | POA: Insufficient documentation

## 2020-10-31 DIAGNOSIS — Z720 Tobacco use: Secondary | ICD-10-CM | POA: Insufficient documentation

## 2020-10-31 DIAGNOSIS — M5430 Sciatica, unspecified side: Secondary | ICD-10-CM | POA: Insufficient documentation

## 2020-10-31 DIAGNOSIS — I739 Peripheral vascular disease, unspecified: Secondary | ICD-10-CM | POA: Insufficient documentation

## 2020-10-31 DIAGNOSIS — Z794 Long term (current) use of insulin: Secondary | ICD-10-CM

## 2020-10-31 DIAGNOSIS — E1143 Type 2 diabetes mellitus with diabetic autonomic (poly)neuropathy: Secondary | ICD-10-CM | POA: Insufficient documentation

## 2020-10-31 DIAGNOSIS — I10 Essential (primary) hypertension: Secondary | ICD-10-CM

## 2020-10-31 DIAGNOSIS — F3177 Bipolar disorder, in partial remission, most recent episode mixed: Secondary | ICD-10-CM

## 2020-10-31 DIAGNOSIS — I517 Cardiomegaly: Secondary | ICD-10-CM | POA: Insufficient documentation

## 2020-10-31 MED ORDER — GLIPIZIDE ER 10 MG PO TB24: 20.0000 mg | ORAL_TABLET | Freq: Every day | ORAL | 0 refills | Status: DC

## 2020-10-31 MED ORDER — ROSUVASTATIN CALCIUM 40 MG PO TABS: ORAL_TABLET | ORAL | 4 refills | Status: DC

## 2020-10-31 MED ORDER — QUETIAPINE FUMARATE 400 MG PO TABS: ORAL_TABLET | ORAL | 1 refills | Status: DC

## 2020-10-31 MED ORDER — GABAPENTIN 300 MG PO CAPS
600.0000 mg | ORAL_CAPSULE | Freq: Three times a day (TID) | ORAL | 1 refills | Status: DC
Start: 2020-10-31 — End: 2021-01-03

## 2020-10-31 MED FILL — glipiZIDE ER 10 MG TB24: 10 | 45 days supply | Qty: 90 | Fill #0

## 2020-10-31 MED FILL — GABAPENTIN 300 MG CAPSULE: 300 | 30 days supply | Qty: 180 | Fill #0

## 2020-10-31 MED FILL — ROSUVASTATIN CALCIUM 40 MG: 40 | 60 days supply | Qty: 60 | Fill #0

## 2020-10-31 MED FILL — QUETIAPINE FUMARATE 400 MG: 400 | 30 days supply | Qty: 60 | Fill #0
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
## 2020-10-31 NOTE — Assessment & Plan Note (Signed)
Unclear patient's control status will refill her blood pressure medicines and see this patient short-term follow-up

## 2020-10-31 NOTE — Assessment & Plan Note (Signed)
Type 2 diabetes clearly not controlled we will need this patient in the office for a face-to-face exam

## 2020-10-31 NOTE — Progress Notes (Signed)
Subjective:    Patient ID: Ashley [name], female    DOB: Mar 04, 1961, 60 y.o.   MRN: [id number] Virtual Visit via Telephone Note  I connected with Ashley [name] on 10/31/20 at 10:00 AM EST by telephone and verified that I am speaking with the correct person using two identifiers.   Consent:  I discussed the limitations, risks, security and privacy concerns of performing an evaluation and management service by telephone and the availability of in person appointments. I also discussed with the patient that there may be a patient responsible charge related to this service. The patient expressed understanding and agreed to proceed.  Location of patient: Patient's at home  Location of provider: I am in my office  Persons participating in the televisit with the patient.   No one else on the call    History of Present Illness:  60 y.o.F former [facility] P[facility] pt.  HTN, T2DM  10/31/2020 60 year old female primary care patient of Ashley [name] has not been seen in this office in some time.  Patient has history of hypertension type 2 diabetes.  She recently was moved into a temporary apartment when she had a [employer] architect.  The current apartment is very stressful there are a lot of roaches is dirty its not well kept and she only has to stay here 1 more week then she will return home.  Patient does have history of bipolar disorder does see mental health provider is on high-dose Seroquel on a daily basis is compliant.  She states despite this she still smoking heavily and has not been able to quit smoking per recommendations.  The patient notes her blood sugars remain elevated in her home and she is under stress with this.  The patient's not been able to be compliant with either her diet or her medications for her diabetes.    Past Medical History:  Diagnosis Date  . Anxiety   . Arthritis   . Bipolar 1 disorder ([place])   . Depression   . Diabetes mellitus without complication ([place])    Type II  . GERD  (gastroesophageal reflux disease)   . Heart murmur    "nothing to worry about"  . HTN (hypertension)    not on medication     Family History  Problem Relation Age of Onset  . Diabetes Mellitus II Father   . Diabetes Brother   . Healthy Mother   . Heart disease Neg Hx   . Stroke Neg Hx   . Kidney disease Neg Hx      Social History   Socioeconomic History  . Marital status: Divorced    Spouse name: Not on file  . Number of children: Not on file  . Years of education: Not on file  . Highest education level: Not on file  Occupational History  . Not on file  Tobacco Use  . Smoking status: Current Every Day Smoker    Packs/day: 1.00    Years: 39.00    Pack years: 39.00    Types: Cigarettes  . Smokeless tobacco: Never Used  . Tobacco comment: pt states that she is using the E cigs and is trying to quit  Vaping Use  . Vaping Use: Never used  Substance and Sexual Activity  . Alcohol use: No  . Drug use: Yes    Types: Cocaine    Comment: 04/06/16- last time  . Sexual activity: Yes    Birth control/protection: Surgical  Other Topics Concern  .  Not on file  Social History Narrative   Lives alone in an apartment.  Drives.  Does not work, disabled due to bipolar.    Social Determinants of Health   Financial Resource Strain: Not on file  Food Insecurity: Not on file  Transportation Needs: Not on file  Physical Activity: Not on file  Stress: Not on file  Social Connections: Not on file  Intimate Partner Violence: Not on file     No Known Allergies   Outpatient Medications Prior to Visit  Medication Sig Dispense Refill  . insulin aspart (NOVOLOG FLEXPEN) 100 UNIT/ML FlexPen Inject 10 units before your 2 largest meals of the day. 6 mL 3  . insulin glargine (LANTUS SOLOSTAR) 100 UNIT/ML Solostar Pen Inject 40 Units into the skin at bedtime. 12 mL 4  . glipiZIDE (GLUCOTROL XL) 10 MG 24 hr tablet Take 1 tablet (10 mg total) by mouth daily. 90 tablet 0  . QUEtiapine  (SEROQUEL) 400 MG tablet TAKE 1 TABLET BY MOUTH TWICE DAILY 60 tablet 0  . Insulin Pen Needle (TRUEPLUS PEN NEEDLES) 32G X 4 MM MISC Use to inject insulin. 100 each 11  . nicotine (NICODERM CQ - DOSED IN MG/24 HOURS) 21 mg/24hr patch Place 1 patch (21 mg total) onto the skin daily. 28 patch 1  . cyclobenzaprine (FLEXERIL) 10 MG tablet Take 0.5 tablets (5 mg total) by mouth 2 (two) times daily as needed for muscle spasms. (Patient not taking: Reported on 10/31/2020) 30 tablet 2  . gabapentin (NEURONTIN) 300 MG capsule 1 cap(s)    . lisinopril (ZESTRIL) 2.5 MG tablet Take 1 tablet (2.5 mg total) by mouth daily. Used to help protect kidneys due to diabetes (Patient not taking: No sig reported) 90 tablet 0  . rosuvastatin (CRESTOR) 20 MG tablet Take 1 tablet (20 mg total) by mouth daily. To lower cholesterol (Patient not taking: Reported on 10/31/2020) 90 tablet 3  . rosuvastatin (CRESTOR) 40 MG tablet 1 tab(s)    . traMADol (ULTRAM) 50 MG tablet TAKE 1 TABLET BY MOUTH TWICE DAILY AS NEEDED FOR BACK PAIN (Patient not taking: Reported on 10/31/2020) 60 tablet 2   No facility-administered medications prior to visit.    Review of Systems  HENT: Negative.   Respiratory: Negative.   Cardiovascular: Negative.   Gastrointestinal: Negative.   Genitourinary: Negative.   Musculoskeletal: Positive for back pain.       Hand pain  Neurological: Positive for dizziness, light-headedness and headaches.  Psychiatric/Behavioral: Positive for agitation, confusion, decreased concentration and dysphoric mood. The patient is nervous/anxious and is hyperactive.        Objective:   Physical Exam No exam this is a phone note       Assessment & [name]:  I personally reviewed all images and lab data in the [facility] system as well as any outside material available during this office visit and agree with the  radiology impressions.   Essential hypertension Unclear patient's control status will refill her blood pressure  medicines and see this patient short-term follow-up  Uncontrolled type 2 diabetes mellitus ([place]) Type 2 diabetes clearly not controlled we will need this patient in the office for a face-to-face exam   Diagnoses and all orders for this visit:  Type 2 diabetes mellitus with hyperglycemia, with long-term current use of insulin (HCC) -     glipiZIDE (GLUCOTROL XL) 10 MG 24 hr tablet; Take 2 tablets (20 mg total) by mouth daily.  Bipolar disorder, in partial remission, most recent  episode mixed (HCC) -     QUEtiapine (SEROQUEL) 400 MG tablet; TAKE 1 TABLET BY MOUTH TWICE DAILY  Essential hypertension  Uncontrolled type 2 diabetes mellitus with hyperglycemia (HCC)  Other orders -     gabapentin (NEURONTIN) 300 MG capsule; Take 2 capsules (600 mg total) by mouth 3 (three) times daily. -     rosuvastatin (CRESTOR) 40 MG tablet; One tablet daily    Follow Up Instructions: Patient knows a direct office exam will occur in a short-term basis   I discussed the assessment and treatment [name] with the patient. The patient was provided an opportunity to ask questions and all were answered. The patient agreed with the [name] and demonstrated an understanding of the instructions.   The patient was advised to call back or seek an in-person evaluation if the symptoms worsen or if the condition fails to improve as anticipated.  I provided 73minutes of non-face-to-face time during this encounter  including  median intraservice time , review of notes, labs, imaging, medications  and explaining diagnosis and management to the patient .    Asencion Noble, MD

## 2020-11-05 MED FILL — traMADol HCL 50 MG TABS: 50 | 7 days supply | Qty: 14 | Fill #1

## 2020-11-06 DIAGNOSIS — I639 Cerebral infarction, unspecified: Secondary | ICD-10-CM

## 2020-11-06 HISTORY — DX: Cerebral infarction, unspecified: I63.9

## 2020-11-11 NOTE — Progress Notes (Signed)
Subjective:    Patient ID: Ashley [name], female    DOB: 01-Jul-1961, 60 y.o.   MRN: [id number] History of Present Illness:  60 y.o.F former [facility] pt.  HTN, T2DM  10/31/2020 Telephone visit  60 year old female primary care patient of Dr. Chapman Fitch has not been seen in this office in some time.  Patient has history of hypertension type 2 diabetes.  She recently was moved into a temporary apartment when she had a [employer] architect.  The current apartment is very stressful there are a lot of roaches is dirty its not well kept and she only has to stay here 1 more week then she will return home.  Patient does have history of bipolar disorder does see mental health provider is on high-dose Seroquel on a daily basis is compliant.  She states despite this she still smoking heavily and has not been able to quit smoking per recommendations.  The patient notes her blood sugars remain elevated in her home and she is under stress with this.  The patient's not been able to be compliant with either her diet or her medications for her diabetes.  11/12/20: Patient seen today in return follow-up for hypertension and uncontrolled type 2 diabetes.  On arrival hemoglobin A1c was 9.5 blood glucose quite elevated.  She has not been compliant with NovoLog also not taking the Lantus she has been using the oral glipizide.  Patient notes some polyuria  Blood pressure on arrival is good at 110/68 and she is not on any blood pressure medications.  Patient had noted the onset of right-sided arm weakness and pain and numbness in the fingers she also has increasing balance difficulty falling frequently.  This just occurred several weeks ago.  It somewhat sudden onset.  She states her right leg also will give out as well.  She has had a history of strokes in the past.  She is smoking pack a day of cigarettes.  Patient also denies any headaches at this time.  Patient is followed by mental health provider is on the Seroquel for bipolar  disorder. Patient states she is unsure and scared of getting the Covid vaccine.  She resolutely declines receiving pneumonia flu vaccines and tetanus vaccines at this visit.  She is willing to have her colon cancer status screen.  Patient continues to have significant anxiety.  PHQ-9 score is very high at this visit.  Patient denies any alcohol use at this time.    Past Medical History:  Diagnosis Date  . Anxiety   . Arthritis   . Bipolar 1 disorder ([place])   . Depression   . Diabetes mellitus without complication ([place])    Type II  . GERD (gastroesophageal reflux disease)   . Heart murmur    "nothing to worry about"  . HTN (hypertension)    not on medication     Family History  Problem Relation Age of Onset  . Diabetes Mellitus II Father   . Diabetes Brother   . Healthy Mother   . Heart disease Neg Hx   . Stroke Neg Hx   . Kidney disease Neg Hx      Social History   Socioeconomic History  . Marital status: Divorced    Spouse name: Not on file  . Number of children: Not on file  . Years of education: Not on file  . Highest education level: Not on file  Occupational History  . Not on file  Tobacco Use  . Smoking  status: Current Every Day Smoker    Packs/day: 1.00    Years: 39.00    Pack years: 39.00    Types: Cigarettes  . Smokeless tobacco: Never Used  . Tobacco comment: pt states that she is using the E cigs and is trying to quit  Vaping Use  . Vaping Use: Never used  Substance and Sexual Activity  . Alcohol use: No  . Drug use: Yes    Types: Cocaine    Comment: 04/06/16- last time  . Sexual activity: Yes    Birth control/protection: Surgical  Other Topics Concern  . Not on file  Social History Narrative   Lives alone in an apartment.  Drives.  Does not work, disabled due to bipolar.    Social Determinants of Health   Financial Resource Strain: Not on file  Food Insecurity: Not on file  Transportation Needs: Not on file  Physical Activity: Not on  file  Stress: Not on file  Social Connections: Not on file  Intimate Partner Violence: Not on file     No Known Allergies   Outpatient Medications Prior to Visit  Medication Sig Dispense Refill  . gabapentin (NEURONTIN) 300 MG capsule Take 2 capsules (600 mg total) by mouth 3 (three) times daily. 180 capsule 1  . glipiZIDE (GLUCOTROL XL) 10 MG 24 hr tablet Take 2 tablets (20 mg total) by mouth daily. 90 tablet 0  . QUEtiapine (SEROQUEL) 400 MG tablet TAKE 1 TABLET BY MOUTH TWICE DAILY 60 tablet 1  . rosuvastatin (CRESTOR) 40 MG tablet One tablet daily 60 tablet 4  . insulin glargine (LANTUS SOLOSTAR) 100 UNIT/ML Solostar Pen Inject 40 Units into the skin at bedtime. 12 mL 4  . Insulin Pen Needle (TRUEPLUS PEN NEEDLES) 32G X 4 MM MISC Use to inject insulin. 100 each 11  . insulin aspart (NOVOLOG FLEXPEN) 100 UNIT/ML FlexPen Inject 10 units before your 2 largest meals of the day. (Patient not taking: Reported on 11/12/2020) 6 mL 3  . nicotine (NICODERM CQ - DOSED IN MG/24 HOURS) 21 mg/24hr patch Place 1 patch (21 mg total) onto the skin daily. (Patient not taking: Reported on 11/12/2020) 28 patch 1   No facility-administered medications prior to visit.    Review of Systems  Constitutional: Positive for fatigue.  HENT: Positive for hearing loss.   Eyes: Negative for visual disturbance.  Respiratory: Negative.   Cardiovascular: Negative.   Gastrointestinal: Negative.   Endocrine: Positive for polyuria.  Genitourinary: Negative.   Musculoskeletal: Positive for back pain and neck stiffness.       Hand pain   Neurological: Positive for dizziness, light-headedness and headaches.  Psychiatric/Behavioral: Positive for agitation, confusion, decreased concentration and dysphoric mood. The patient is nervous/anxious and is hyperactive.        Objective:   Physical Exam  Vitals:   11/12/20 0848  BP: 110/68  Pulse: 91  SpO2: 99%  Weight: 142 lb 12.8 oz (64.8 kg)  Height: 5' 7" (1.702 m)     Gen: Pleasant, thin in no distress, anxious affect  ENT: No lesions,  mouth clear,  oropharynx clear, no postnasal drip, moderate periodontal disease  Neck: No JVD, no TMG, no carotid bruits  Lungs: No use of accessory muscles, no dullness to percussion, clear without rales or rhonchi  Cardiovascular: RRR, heart sounds normal, no murmur or gallops, no peripheral edema  Abdomen: soft and NT, no HSM,  BS normal  Musculoskeletal: No deformities, no cyanosis or clubbing, tender in the lower  back area  Neuro: alert, non focal  Skin: Warm, no lesions or rashes     Assessment & Plan:  I personally reviewed all images and lab data in the [facility] system as well as any outside material available during this office visit and agree with the  radiology impressions.   Uncontrolled type 2 diabetes mellitus ([place]) Uncontrolled type 2 diabetes  Plan is to resume insulin glargine 35 units daily note patient's insurance would not cover insulin glargine Lantus so we will switch to Levemir 35 units daily  Will refill glipizide for now  Hold off on NovoLog Have this patient come in to see our clinical pharmacist for further education    Type 2 diabetes mellitus with diabetic autonomic (poly)neuropathy ([place]) Continue gabapentin  Essential hypertension Blood pressure currently controlled no changes made  Renal insufficiency Follow-up renal function panel  Thrombocytosis Follow-up CBC  Mixed dyslipidemia Follow-up lipid panel Continue Crestor  Tobacco user    . Current smoking consumption amount: 1 pack a day  . Dicsussion on advise to quit smoking and smoking impacts: Impacts on cardiovascular health  . Patient's willingness to quit: Not yet ready to quit  . Methods to quit smoking discussed: Behavioral modification  . Medication management of smoking session drugs discussed: Too many side effects with multiple medications  . Resources provided:  AVS   . Setting quit date  not established  . Follow-up arranged 2 months   Time spent counseling the patient: 5 minutes    Denijah was seen today for pain.  Diagnoses and all orders for this visit:  Type 2 diabetes mellitus with hyperglycemia, with long-term current use of insulin (HCC) -     Comprehensive metabolic panel -     CBC with Differential/Platelet -     Lipid panel -     POCT glucose (manual entry) -     POCT glycosylated hemoglobin (Hb A1C) -     Discontinue: insulin glargine (LANTUS SOLOSTAR) 100 UNIT/ML Solostar Pen; Inject 40 Units into the skin at bedtime. -     Insulin Pen Needle (TRUEPLUS PEN NEEDLES) 32G X 4 MM MISC; Use to inject insulin.  Bipolar disorder, in partial remission, most recent episode mixed ([place])  Peripheral vascular disease with claudication (HCC) -     CBC with Differential/Platelet  Colon cancer screening -     Fecal occult blood, imunochemical  Type 2 diabetes mellitus with hyperglycemia, with long-term current use of insulin (HCC) -     Comprehensive metabolic panel -     CBC with Differential/Platelet -     Lipid panel -     POCT glucose (manual entry) -     POCT glycosylated hemoglobin (Hb A1C) -     Discontinue: insulin glargine (LANTUS SOLOSTAR) 100 UNIT/ML Solostar Pen; Inject 40 Units into the skin at bedtime. -     Insulin Pen Needle (TRUEPLUS PEN NEEDLES) 32G X 4 MM MISC; Use to inject insulin.  Cerebrovascular accident (CVA), unspecified mechanism ([place]) -     MR Brain W Wo Contrast; Future -     Ambulatory referral to Neurology -     For home use only DME Other see comment  Essential hypertension -     Thyroid Panel With TSH  Uncontrolled type 2 diabetes mellitus with hyperglycemia ([place])  Type 2 diabetes mellitus with diabetic autonomic neuropathy, with long-term current use of insulin (HCC)  Renal insufficiency  Thrombocytosis  Mixed dyslipidemia  Tobacco user  Other orders -  aspirin EC 81 MG tablet; Take 1 tablet (81 mg total)  by mouth daily. Swallow whole. -     insulin detemir (LEVEMIR) 100 UNIT/ML FlexPen; Inject 40 Units into the skin daily.   Fecal occult kit was given for colon cancer screening  Reminded patient to get her booster vaccine but she will not need to get this until this summer as she just finished her Moderna series end of December

## 2020-11-12 ENCOUNTER — Ambulatory Visit: Payer: 59 | Attending: Critical Care Medicine | Admitting: Critical Care Medicine

## 2020-11-12 ENCOUNTER — Encounter: Payer: Self-pay | Admitting: Critical Care Medicine

## 2020-11-12 ENCOUNTER — Other Ambulatory Visit: Payer: Self-pay

## 2020-11-12 VITALS — BP 110/68 | HR 91 | Ht 67.0 in | Wt 142.8 lb

## 2020-11-12 DIAGNOSIS — I739 Peripheral vascular disease, unspecified: Secondary | ICD-10-CM | POA: Diagnosis not present

## 2020-11-12 DIAGNOSIS — I639 Cerebral infarction, unspecified: Secondary | ICD-10-CM

## 2020-11-12 DIAGNOSIS — E1143 Type 2 diabetes mellitus with diabetic autonomic (poly)neuropathy: Secondary | ICD-10-CM

## 2020-11-12 DIAGNOSIS — F3177 Bipolar disorder, in partial remission, most recent episode mixed: Secondary | ICD-10-CM | POA: Diagnosis not present

## 2020-11-12 DIAGNOSIS — Z720 Tobacco use: Secondary | ICD-10-CM

## 2020-11-12 DIAGNOSIS — E1165 Type 2 diabetes mellitus with hyperglycemia: Secondary | ICD-10-CM

## 2020-11-12 DIAGNOSIS — E782 Mixed hyperlipidemia: Secondary | ICD-10-CM

## 2020-11-12 DIAGNOSIS — N289 Disorder of kidney and ureter, unspecified: Secondary | ICD-10-CM

## 2020-11-12 DIAGNOSIS — Z794 Long term (current) use of insulin: Secondary | ICD-10-CM | POA: Diagnosis not present

## 2020-11-12 DIAGNOSIS — D75839 Thrombocytosis, unspecified: Secondary | ICD-10-CM

## 2020-11-12 DIAGNOSIS — Z1211 Encounter for screening for malignant neoplasm of colon: Secondary | ICD-10-CM

## 2020-11-12 DIAGNOSIS — I10 Essential (primary) hypertension: Secondary | ICD-10-CM

## 2020-11-12 LAB — POCT GLYCOSYLATED HEMOGLOBIN (HGB A1C): HbA1c, POC (controlled diabetic range): 9.5 % — AB (ref 0.0–7.0)

## 2020-11-12 LAB — GLUCOSE, POCT (MANUAL RESULT ENTRY): POC Glucose: 263 mg/dl — AB (ref 70–99)

## 2020-11-12 MED ORDER — TRUEPLUS PEN NEEDLES 32G X 4 MM MISC: 11 refills | Status: DC

## 2020-11-12 MED ORDER — ASPIRIN EC 81 MG PO TBEC: 81.0000 mg | DELAYED_RELEASE_TABLET | Freq: Every day | ORAL | 11 refills | Status: DC

## 2020-11-12 MED ORDER — INSULIN DETEMIR 100 UNIT/ML FLEXPEN: 40.0000 [IU] | PEN_INJECTOR | Freq: Every day | SUBCUTANEOUS | 11 refills | Status: DC

## 2020-11-12 MED ORDER — LANTUS SOLOSTAR 100 UNIT/ML ~~LOC~~ SOPN
40.0000 [IU] | PEN_INJECTOR | Freq: Every day | SUBCUTANEOUS | 4 refills | Status: DC
Start: 2020-11-12 — End: 2020-11-12

## 2020-11-12 MED FILL — TOUJEO SOLOSTAR 300 UNITS/M: 300 | 23 days supply | Qty: 3 | Fill #0

## 2020-11-12 MED FILL — ASPIRIN LOW DOSE 81 MG TBEC: 81 | 30 days supply | Qty: 30 | Fill #0

## 2020-11-12 MED FILL — UNIFINE PENTIPS 32GX5/32: 32G X 4 MM | 90 days supply | Qty: 100 | Fill #0

## 2020-11-12 NOTE — Assessment & Plan Note (Signed)
  .   Current smoking consumption amount: 1 pack a day  . Dicsussion on advise to quit smoking and smoking impacts: Impacts on cardiovascular health  . Patient's willingness to quit: Not yet ready to quit  . Methods to quit smoking discussed: Behavioral modification  . Medication management of smoking session drugs discussed: Too many side effects with multiple medications  . Resources provided:  AVS   . Setting quit date not established  . Follow-up arranged 2 months   Time spent counseling the patient: 5 minutes

## 2020-11-12 NOTE — Patient Instructions (Signed)
A referral to neurology will be made  Resume insulin glargine 40 units daily refill sent to [facility] outpatient pharmacy  Stop by the lab on the way out obtain lab draws  An MRI of the brain will be obtained  No other medication changes except begin aspirin 81 mg daily prescription sent to [place] declined all vaccines we offered  Return to see [name] 1 month  An appointment with [name] our clinical pharmacist for your diabetes will be made in 2 weeks   Tobacco Use Disorder Tobacco use disorder (TUD) occurs when a person craves, seeks, and uses tobacco, regardless of the consequences. This disorder can cause problems with mental and physical health. It can affect your ability to have healthy relationships, and it can keep you from meeting your responsibilities at work, home, or school. Tobacco may be:  Smoked as a cigarette or cigar.  Inhaled using e-cigarettes.  Smoked in a pipe or hookah.  Chewed as smokeless tobacco.  Inhaled into the nostrils as snuff. Tobacco products contain a dangerous chemical called nicotine, which is very addictive. Nicotine triggers hormones that make the body feel stimulated and works on areas of the brain that make you feel good. These effects can make it hard for people to quit nicotine. Tobacco contains many other unsafe chemicals that can damage almost every organ in the body. Smoking tobacco also puts others in danger due to fire risk and possible health problems caused by breathing in secondhand smoke. What are the signs or symptoms? Symptoms of TUD may include:  Being unable to slow down or stop your tobacco use.  Spending an abnormal amount of time getting or using tobacco.  Craving tobacco. Cravings may last for up to 6 months after quitting.  Tobacco use that: ? Interferes with your work, school, or home life. ? Interferes with your personal and social relationships. ? Makes you give up activities that  you once enjoyed or found important.  Using tobacco even though you know that it is: ? Dangerous or bad for your health or someone else's health. ? Causing problems in your life.  Needing more and more of the substance to get the same effect (developing tolerance).  Experiencing unpleasant symptoms if you do not use the substance (withdrawal). Withdrawal symptoms may include: ? Depressed, anxious, or irritable mood. ? Difficulty concentrating. ? Increased appetite. ? Restlessness or trouble sleeping.  Using the substance to avoid withdrawal. How is this diagnosed? This condition may be diagnosed based on:  Your current and past tobacco use. Your health care provider may ask questions about how your tobacco use affects your life.  A physical exam. You may be diagnosed with TUD if you have at least two symptoms within a 25-month period. How is this treated? This condition is treated by stopping tobacco use. Many people are unable to quit on their own and need help. Treatment may include:  Nicotine replacement therapy (NRT). NRT provides nicotine without the other harmful chemicals in tobacco. NRT gradually lowers the dosage of nicotine in the body and reduces withdrawal symptoms. NRT is available as: ? Over-the-counter gums, lozenges, and skin patches. ? Prescription mouth inhalers and nasal sprays.  Medicine that acts on the brain to reduce cravings and withdrawal symptoms.  A type of talk therapy that examines your triggers for tobacco use, how to avoid them, and how to cope with cravings (behavioral therapy).  Hypnosis. This may help with withdrawal symptoms.  Joining a  support group for others coping with TUD. The best treatment for TUD is usually a combination of medicine, talk therapy, and support groups. Recovery can be a long process. Many people start using tobacco again after stopping (relapse). If you relapse, it does not mean that treatment will not work. Follow these  instructions at home: Lifestyle  Do not use any products that contain nicotine or tobacco, such as cigarettes and e-cigarettes.  Avoid things that trigger tobacco use as much as you can. Triggers include people and situations that usually cause you to use tobacco.  Avoid drinks that contain caffeine, including coffee. These may worsen some withdrawal symptoms.  Find ways to manage stress. Wanting to smoke may cause stress, and stress can make you want to smoke. Relaxation techniques such as deep breathing, meditation, and yoga may help.  Attend support groups as needed. These groups are an important part of long-term recovery for many people. General instructions  Take over-the-counter and prescription medicines only as told by your health care provider.  Check with your health care provider before taking any new prescription or over-the-counter medicines.  Decide on a friend, family member, or smoking quit-line (such as 1-800-QUIT-NOW in the [place] that you can call or text when you feel the urge to smoke or when you need help coping with cravings.  Keep all follow-up visits as told by your health care provider and therapist. This is important.   Contact a health care provider if:  You are not able to take your medicines as prescribed.  Your symptoms get worse, even with treatment. Summary  Tobacco use disorder (TUD) occurs when a person craves, seeks, and uses tobacco regardless of the consequences.  This condition may be diagnosed based on your current and past tobacco use and a physical exam.  Many people are unable to quit on their own and need help. Recovery can be a long process.  The most effective treatment for TUD is usually a combination of medicine, talk therapy, and support groups. This information is not intended to replace advice given to you by your health care provider. Make sure you discuss any questions you have with your health care provider. Document Revised:  09/09/2017 Document Reviewed: 09/09/2017 Elsevier Patient Education  2021 [employer].

## 2020-11-12 NOTE — Progress Notes (Signed)
Having pain on right arm. Finger tips are numb. Off balance keeps falling.

## 2020-11-12 NOTE — Assessment & Plan Note (Addendum)
- 

## 2020-11-12 NOTE — Assessment & Plan Note (Signed)
Continue gabapentin.

## 2020-11-12 NOTE — Assessment & Plan Note (Signed)
Follow-up CBC ?

## 2020-11-12 NOTE — Assessment & Plan Note (Signed)
Blood pressure currently controlled no changes made

## 2020-11-12 NOTE — Assessment & Plan Note (Signed)
Follow-up renal function panel

## 2020-11-12 NOTE — Assessment & Plan Note (Signed)
Uncontrolled type 2 diabetes  Plan is to resume insulin glargine 35 units daily note patient's insurance would not cover insulin glargine Lantus so we will switch to Levemir 35 units daily  Will refill glipizide for now  Hold off on NovoLog Have this patient come in to see our clinical pharmacist for further education

## 2020-11-13 ENCOUNTER — Encounter: Payer: Self-pay | Admitting: *Deleted

## 2020-11-13 LAB — LIPID PANEL
Chol/HDL Ratio: 7.5 ratio — ABNORMAL HIGH (ref 0.0–4.4)
Cholesterol, Total: 314 mg/dL — ABNORMAL HIGH (ref 100–199)
HDL: 42 mg/dL (ref >39)
LDL Chol Calc (NIH): 194 mg/dL — ABNORMAL HIGH (ref 0–99)
Triglycerides: 386 mg/dL — ABNORMAL HIGH (ref 0–149)
VLDL Cholesterol Cal: 78 mg/dL — ABNORMAL HIGH (ref 5–40)

## 2020-11-13 LAB — CBC WITH DIFFERENTIAL/PLATELET
Basophils Absolute: 0.1 10*3/uL (ref 0.0–0.2)
Basos: 1 %
EOS (ABSOLUTE): 0.3 10*3/uL (ref 0.0–0.4)
Eos: 3 %
Hematocrit: 42.2 % (ref 34.0–46.6)
Hemoglobin: 13.6 g/dL (ref 11.1–15.9)
Immature Grans (Abs): 0 10*3/uL (ref 0.0–0.1)
Immature Granulocytes: 0 %
Lymphocytes Absolute: 3.9 10*3/uL — ABNORMAL HIGH (ref 0.7–3.1)
Lymphs: 39 %
MCH: 30.1 pg (ref 26.6–33.0)
MCHC: 32.2 g/dL (ref 31.5–35.7)
MCV: 93 fL (ref 79–97)
Monocytes Absolute: 0.5 10*3/uL (ref 0.1–0.9)
Monocytes: 5 %
Neutrophils Absolute: 5.1 10*3/uL (ref 1.4–7.0)
Neutrophils: 52 %
Platelets: 374 10*3/uL (ref 150–450)
RBC: 4.52 x10E6/uL (ref 3.77–5.28)
RDW: 12.4 % (ref 11.7–15.4)
WBC: 9.9 10*3/uL (ref 3.4–10.8)

## 2020-11-13 LAB — COMPREHENSIVE METABOLIC PANEL
ALT: 15 IU/L (ref 0–32)
AST: 12 IU/L (ref 0–40)
Albumin/Globulin Ratio: 1.8 (ref 1.2–2.2)
Albumin: 4.4 g/dL (ref 3.8–4.8)
Alkaline Phosphatase: 116 IU/L (ref 44–121)
BUN/Creatinine Ratio: 11 — ABNORMAL LOW (ref 12–28)
BUN: 11 mg/dL (ref 8–27)
Bilirubin Total: <0.2 mg/dL (ref 0.0–1.2)
CO2: 23 mmol/L (ref 20–29)
Calcium: 10.1 mg/dL (ref 8.7–10.3)
Chloride: 100 mmol/L (ref 96–106)
Creatinine, Ser: 0.98 mg/dL (ref 0.57–1.00)
GFR calc Af Amer: 72 mL/min/{1.73_m2} (ref >59)
GFR calc non Af Amer: 62 mL/min/{1.73_m2} (ref >59)
Globulin, Total: 2.4 g/dL (ref 1.5–4.5)
Glucose: 364 mg/dL — ABNORMAL HIGH (ref 65–99)
Potassium: 4.6 mmol/L (ref 3.5–5.2)
Sodium: 138 mmol/L (ref 134–144)
Total Protein: 6.8 g/dL (ref 6.0–8.5)

## 2020-11-13 LAB — THYROID PANEL WITH TSH
Free Thyroxine Index: 0.8 — ABNORMAL LOW (ref 1.2–4.9)
T3 Uptake Ratio: 26 % (ref 24–39)
T4, Total: 3.2 ug/dL — ABNORMAL LOW (ref 4.5–12.0)
TSH: 0.97 u[IU]/mL (ref 0.450–4.500)

## 2020-11-23 ENCOUNTER — Other Ambulatory Visit: Payer: Self-pay

## 2020-11-23 ENCOUNTER — Ambulatory Visit (HOSPITAL_COMMUNITY)
Admission: RE | Admit: 2020-11-23 | Discharge: 2020-11-23 | Disposition: A | Discharge status: Home / Self Care | Payer: 59 | Source: Ambulatory Visit | Attending: Critical Care Medicine | Admitting: Critical Care Medicine

## 2020-11-23 ENCOUNTER — Other Ambulatory Visit: Payer: Self-pay | Admitting: Critical Care Medicine

## 2020-11-23 DIAGNOSIS — I639 Cerebral infarction, unspecified: Secondary | ICD-10-CM

## 2020-11-25 ENCOUNTER — Other Ambulatory Visit: Payer: Self-pay | Admitting: Critical Care Medicine

## 2020-11-25 MED ORDER — CLOPIDOGREL BISULFATE 75 MG PO TABS: 75.0000 mg | ORAL_TABLET | Freq: Every day | ORAL | 3 refills | Status: DC

## 2020-11-26 ENCOUNTER — Telehealth: Payer: Self-pay | Admitting: Critical Care Medicine

## 2020-11-26 ENCOUNTER — Telehealth: Payer: Self-pay | Admitting: *Deleted

## 2020-11-26 ENCOUNTER — Telehealth: Payer: Self-pay

## 2020-11-26 MED ORDER — CLOPIDOGREL BISULFATE 75 MG PO TABS: 75.0000 mg | ORAL_TABLET | Freq: Every day | ORAL | 3 refills | Status: DC

## 2020-11-26 NOTE — Telephone Encounter (Signed)
Copied from [place] [id number]. Topic: General - Inquiry >> Nov 26, 2020  9:24 AM Greggory Keen D wrote: Reason for CRM: Pt returned a call saying someone called her this weekend from the office.  CB#  [phone]   Did not see any documentation but it did look like orders were placed yesterday. Did not know if someone reached out to her about that. Please follow up if appropriate.

## 2020-11-26 NOTE — Telephone Encounter (Signed)
I sent plavix to [facility].  Next step is to get patient to go to Neurology  If she is worse then needs to call EMS or go to ED

## 2020-11-26 NOTE — Telephone Encounter (Signed)
Attempted to contact the patient # [phone] to obtain authorization to send referral to [employer] [place] to assist with housing issues.  Voicemail full, unable to leave a message.

## 2020-11-26 NOTE — Telephone Encounter (Signed)
-----   Message from Elsie Stain, MD sent at 11/25/2020  9:25 AM EST ----- I see where dr Margarita Rana spoke to the patient.  She did not go to ED as instructed.  I have tried to reach out to her today 2/19 but no answer.  She should go to ED for evaluation as Neuro appt is too far out.  I did Rx plavix to the [facility] pharmacy
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
## 2020-11-27 ENCOUNTER — Encounter: Payer: Self-pay | Admitting: Neurology

## 2020-11-27 NOTE — Telephone Encounter (Signed)
noted 

## 2020-11-27 NOTE — Telephone Encounter (Signed)
Representative from [facility] Neurology scheduling called to schedule appt for patient to f/u. Conference call placed with the patient.  The soonest availability was  April 27,2022 at 0750.  Per Neurology office Ashley [name] MRI and findings were reviewed by a MD and okay to be scheduled as routine.

## 2020-11-27 NOTE — Telephone Encounter (Signed)
Medication was delivered to patient address on yesterday and left at her doorstep since there was no answer.    Patient aware and went to the door while writer was on the phone with her. She states she received it. Other medications will be switched to [facility] and delivered at no cost to patient. The pharmacist was given niumber to [place] to transfer medications. Patient consented to transfer.   Patient gave verbal consent to involve Legal Aid with living conditions.   Advised patient to answer all calls at this time since [facility] had been trying to get her in the office sooner rather than later since she does not wish to go to the ED at this time.

## 2020-11-27 NOTE — Telephone Encounter (Signed)
[facility] to assist patient to schedule an appt. No answer at Neurology office. LVM to return call at patient number.

## 2020-11-28 NOTE — Telephone Encounter (Signed)
Verbal approval for referral to [employer] [place] given by patient to Carilyn Goodpasture, RN. Referral then sent to Novella Olive, [place]/LANC

## 2020-12-17 ENCOUNTER — Ambulatory Visit (INDEPENDENT_AMBULATORY_CARE_PROVIDER_SITE_OTHER): Payer: 59 | Admitting: Neurology

## 2020-12-17 ENCOUNTER — Other Ambulatory Visit: Payer: Self-pay

## 2020-12-17 ENCOUNTER — Encounter: Payer: Self-pay | Admitting: Neurology

## 2020-12-17 VITALS — BP 145/84 | HR 84 | Ht 67.0 in | Wt 145.0 lb

## 2020-12-17 DIAGNOSIS — I63412 Cerebral infarction due to embolism of left middle cerebral artery: Secondary | ICD-10-CM

## 2020-12-17 DIAGNOSIS — G8250 Quadriplegia, unspecified: Secondary | ICD-10-CM

## 2020-12-17 NOTE — Progress Notes (Signed)
Reason for visit: Stroke  Referring physician: Dr. Maxwell Marion is a 60 y.o. female  History of present illness:  Ashley [name] is a 60 year old right-handed black female with a history of diabetes, dyslipidemia, bipolar disorder, peripheral vascular disease, and tobacco and marijuana use.  The patient indicates that she began having new issues about 3 weeks prior to this evaluation.  She comes in with her sister today.  The patient started having some speech difficulty with nonsensical speech.  She also developed some right arm and right leg weakness that has persisted.  The speech issue seems to have improved.  She denied any headache or any vision changes.  She has a history of some numbness in both hands, worse on the left.  She has a 80-month history of neck pain that is in the midportion of the neck without pain down either arm.  She has noted some problems with urinary incontinence recently.  Since the stroke event, she has had falls, she has bumped her head on several occasions.  She has been set up for physical therapy with home health therapy but this has not yet started.  MRI of the brain was done and showed evidence of a small cortical left parietal stroke.  An old basal ganglia stroke was also noted on the left.  The patient is able to walk with a walker, she can no longer walk independently.  She comes to this office for further evaluation.  Her LDL cholesterol recently was noted to be 194, she is now on Crestor and was recently started on aspirin.  She smokes marijuana daily, she has been smoking 1-1/2 packs of cigarettes daily.  She denies any other illicit drugs such as cocaine use.  Her most recent hemoglobin A1c was 7.9.  Past Medical History:  Diagnosis Date  . Anxiety   . Arthritis   . Bipolar 1 disorder ([place])   . Depression   . Diabetes mellitus without complication ([place])    Type II  . GERD (gastroesophageal reflux disease)   . Heart murmur    "nothing to  worry about"  . HTN (hypertension)    not on medication  . Stroke [facility]) 11/2020    Past Surgical History:  Procedure Laterality Date  . ABDOMINAL HYSTERECTOMY    . LUMBAR LAMINECTOMY/DECOMPRESSION MICRODISCECTOMY Left 04/07/2016   Procedure: Left Lumbar four-five Microdiskectomy;  Surgeon: Erline Levine, MD;  Location: [facility];  Service: Neurosurgery;  Laterality: Left;    Family History  Problem Relation Age of Onset  . Diabetes Mellitus II Father   . Diabetes Brother   . Healthy Mother   . Heart disease Neg Hx   . Stroke Neg Hx   . Kidney disease Neg Hx     Social history:  reports that she has been smoking cigarettes. She has a 9.75 pack-year smoking history. She has never used smokeless tobacco. She reports current drug use. Drug: Cocaine. She reports that she does not drink alcohol.  Medications:  Prior to Admission medications   Medication Sig Start Date End Date Taking? Authorizing Provider  aspirin EC 81 MG tablet Take 1 tablet (81 mg total) by mouth daily. Swallow whole. 11/12/20  Yes Elsie Stain, MD  clopidogrel (PLAVIX) 75 MG tablet Take 1 tablet (75 mg total) by mouth daily. 11/26/20  Yes Elsie Stain, MD  glipiZIDE (GLUCOTROL XL) 10 MG 24 hr tablet Take 2 tablets (20 mg total) by mouth daily. 10/31/20  Yes  Elsie Stain, MD  insulin detemir (LEVEMIR) 100 UNIT/ML FlexPen Inject 40 Units into the skin daily. 11/12/20  Yes Elsie Stain, MD  Insulin Pen Needle (TRUEPLUS PEN NEEDLES) 32G X 4 MM MISC Use to inject insulin. 11/12/20  Yes Elsie Stain, MD  QUEtiapine (SEROQUEL) 400 MG tablet TAKE 1 TABLET BY MOUTH TWICE DAILY 10/31/20  Yes Elsie Stain, MD  rosuvastatin (CRESTOR) 40 MG tablet One tablet daily 10/31/20  Yes Elsie Stain, MD  gabapentin (NEURONTIN) 300 MG capsule Take 2 capsules (600 mg total) by mouth 3 (three) times daily. 10/31/20 11/30/20  Elsie Stain, MD     No Known Allergies  ROS:  Out of a complete 14 system review  of symptoms, the patient complains only of the following symptoms, and all other reviewed systems are negative.  Weakness Walking difficulty Neck pain Bladder control problems  Blood pressure (!) 145/84, pulse 84, height 5\' 7"  (1.702 m), weight 145 lb (65.8 kg).  Physical Exam  General: The patient is alert and cooperative at the time of the examination.  Eyes: Pupils are equal, round, and reactive to light. Discs are flat bilaterally.  Neck: The neck is supple, no carotid bruits are noted.  Respiratory: The respiratory examination is clear.  Cardiovascular: The cardiovascular examination reveals a regular rate and rhythm, no obvious murmurs or rubs are noted.  Skin: Extremities are without significant edema.  Neurologic Exam  Mental status: The patient is alert and oriented x 3 at the time of the examination. The patient has apparent normal recent and remote memory, with an apparently normal attention span and concentration ability.  Cranial nerves: Facial symmetry is present. There is good sensation of the face to pinprick and soft touch bilaterally. The strength of the facial muscles and the muscles to head turning and shoulder shrug are normal bilaterally. Speech is well enunciated, no aphasia or dysarthria is noted. Extraocular movements are full. Visual fields are full. The tongue is midline, and the patient has symmetric elevation of the soft palate. No obvious hearing deficits are noted.  Motor: The motor testing reveals 3/5 strength with intrinsic muscles of the right hand, 4/5 on the left, the patient has diffusely 4 -/5 strength with the right arm and 3/5 strength with the right deltoid muscle.  4+/5 strength is seen proximally in the left arm.  The patient has proximal muscle weakness of the left leg with hip flexion of 4/5 and 4 -/5 strength on the right.  The patient has weakness with knee flexion on the right, none on the left and mild foot drop on the right.  Sensory:  Sensory testing is notable for some decrease in pinprick sensation on the left hand as compared to the right, position sense is decreased on the right hand and right foot, normal on the left.  Vibration sensation is symmetric in the hands, decreased on the right foot as compared to the left. No evidence of extinction is noted.  Coordination: Cerebellar testing reveals good finger-nose-finger and heel-to-shin bilaterally, but the patient has some difficulty performing finger-nose-finger with the right arm due to weakness.  Gait and station: Gait is wide-based, unsteady.  Tandem gait was not attempted.  Romberg is negative.  Reflexes: Deep tendon reflexes are increased on the right biceps compared to the left, decreased triceps reflex is seen bilaterally.  Knee jerk reflexes are symmetric, ankle jerk reflexes are depressed bilaterally but are symmetric. Toes are downgoing bilaterally.   MRI brain 11/24/20:  IMPRESSION: 1. Small acute/early subacute infarct of the posterior left parietal lobe. No hemorrhage or mass effect. 2. Old left basal ganglia infarct.  * MRI scan images were reviewed online. I agree with the written report.   2 D echo 05/18/17:  Study Conclusions   - Left ventricle: The cavity size was normal. Wall thickness was  normal. Systolic function was vigorous. The estimated ejection  fraction was in the range of 65% to 70%. Wall motion was normal;  there were no regional wall motion abnormalities. Doppler  parameters are consistent with abnormal left ventricular  relaxation (grade 1 diastolic dysfunction).   Impressions:   - Hyperdynamic LV systolic function; mild diastolic dysfunction;  elevated LVOT mean gradient of 13 mmHg likely related to  hyperdynamic LV function.     Assessment/Plan:  1.  Left parietal cortical stroke  2.  Quadriparesis, right greater than left weakness  3.  Gait disorder  4.  Tobacco and marijuana abuse  5.   Dyslipidemia  6.  Hypertension  The patient has multiple risk factors for stroke.  I have indicated that she needs to eliminate the use of tobacco and marijuana if possible, she has been placed on a medication for cholesterol, her LDL cholesterol is extremely elevated.  The patient has had a small cortical stroke in the left parietal area that could potentially be cardioembolic.  She will have a CT angiogram of the head and neck and undergo a 2D echocardiogram.  The clinical examination shows a significant right hemiparesis but the patient also has left-sided weakness as well.  I do not believe that the MRI brain findings explain her clinical examination and for this reason I will check MRI of the cervical spine to exclude a cervical myelopathy.  The patient will follow up here in 3 months.  I will set her up for physical and occupational therapy.  Jill Alexanders MD 12/17/2020 10:32 AM  [facility] [address] [place], [place] [address]  Phone [phone] Fax [phone]

## 2020-12-18 ENCOUNTER — Ambulatory Visit: Payer: 59 | Attending: Critical Care Medicine | Admitting: Critical Care Medicine

## 2020-12-18 ENCOUNTER — Telehealth: Payer: Self-pay | Admitting: Neurology

## 2020-12-18 ENCOUNTER — Encounter: Payer: Self-pay | Admitting: Critical Care Medicine

## 2020-12-18 ENCOUNTER — Inpatient Hospital Stay (HOSPITAL_COMMUNITY)
Admission: EM | Admit: 2020-12-18 | Discharge: 2020-12-28 | DRG: 471 | Disposition: A | Discharge status: Rehabilitation Facility | Payer: 59 | Attending: Internal Medicine | Admitting: Internal Medicine

## 2020-12-18 ENCOUNTER — Other Ambulatory Visit: Payer: Self-pay

## 2020-12-18 ENCOUNTER — Encounter (HOSPITAL_COMMUNITY): Payer: Self-pay | Admitting: Emergency Medicine

## 2020-12-18 DIAGNOSIS — E785 Hyperlipidemia, unspecified: Secondary | ICD-10-CM | POA: Diagnosis present

## 2020-12-18 DIAGNOSIS — R339 Retention of urine, unspecified: Secondary | ICD-10-CM

## 2020-12-18 DIAGNOSIS — R5381 Other malaise: Secondary | ICD-10-CM | POA: Diagnosis present

## 2020-12-18 DIAGNOSIS — E1165 Type 2 diabetes mellitus with hyperglycemia: Secondary | ICD-10-CM | POA: Diagnosis present

## 2020-12-18 DIAGNOSIS — R64 Cachexia: Secondary | ICD-10-CM | POA: Diagnosis present

## 2020-12-18 DIAGNOSIS — Z8673 Personal history of transient ischemic attack (TIA), and cerebral infarction without residual deficits: Secondary | ICD-10-CM

## 2020-12-18 DIAGNOSIS — Z20822 Contact with and (suspected) exposure to covid-19: Secondary | ICD-10-CM | POA: Diagnosis present

## 2020-12-18 DIAGNOSIS — Z7902 Long term (current) use of antithrombotics/antiplatelets: Secondary | ICD-10-CM

## 2020-12-18 DIAGNOSIS — G8250 Quadriplegia, unspecified: Secondary | ICD-10-CM | POA: Diagnosis present

## 2020-12-18 DIAGNOSIS — K5900 Constipation, unspecified: Secondary | ICD-10-CM | POA: Diagnosis present

## 2020-12-18 DIAGNOSIS — K219 Gastro-esophageal reflux disease without esophagitis: Secondary | ICD-10-CM | POA: Diagnosis present

## 2020-12-18 DIAGNOSIS — M4802 Spinal stenosis, cervical region: Principal | ICD-10-CM | POA: Diagnosis present

## 2020-12-18 DIAGNOSIS — E440 Moderate protein-calorie malnutrition: Secondary | ICD-10-CM | POA: Diagnosis present

## 2020-12-18 DIAGNOSIS — Z7982 Long term (current) use of aspirin: Secondary | ICD-10-CM

## 2020-12-18 DIAGNOSIS — Z79899 Other long term (current) drug therapy: Secondary | ICD-10-CM

## 2020-12-18 DIAGNOSIS — Z419 Encounter for procedure for purposes other than remedying health state, unspecified: Secondary | ICD-10-CM

## 2020-12-18 DIAGNOSIS — F319 Bipolar disorder, unspecified: Secondary | ICD-10-CM | POA: Diagnosis present

## 2020-12-18 DIAGNOSIS — Z9119 Patient's noncompliance with other medical treatment and regimen: Secondary | ICD-10-CM

## 2020-12-18 DIAGNOSIS — R296 Repeated falls: Secondary | ICD-10-CM | POA: Diagnosis present

## 2020-12-18 DIAGNOSIS — M5000 Cervical disc disorder with myelopathy, unspecified cervical region: Secondary | ICD-10-CM

## 2020-12-18 DIAGNOSIS — D72828 Other elevated white blood cell count: Secondary | ICD-10-CM | POA: Diagnosis not present

## 2020-12-18 DIAGNOSIS — Z6822 Body mass index (BMI) 22.0-22.9, adult: Secondary | ICD-10-CM

## 2020-12-18 DIAGNOSIS — E1142 Type 2 diabetes mellitus with diabetic polyneuropathy: Secondary | ICD-10-CM | POA: Diagnosis present

## 2020-12-18 DIAGNOSIS — R531 Weakness: Secondary | ICD-10-CM

## 2020-12-18 DIAGNOSIS — F3170 Bipolar disorder, currently in remission, most recent episode unspecified: Secondary | ICD-10-CM | POA: Diagnosis present

## 2020-12-18 DIAGNOSIS — Z720 Tobacco use: Secondary | ICD-10-CM | POA: Diagnosis present

## 2020-12-18 DIAGNOSIS — D62 Acute posthemorrhagic anemia: Secondary | ICD-10-CM

## 2020-12-18 DIAGNOSIS — Z794 Long term (current) use of insulin: Secondary | ICD-10-CM

## 2020-12-18 DIAGNOSIS — M4712 Other spondylosis with myelopathy, cervical region: Secondary | ICD-10-CM | POA: Diagnosis present

## 2020-12-18 DIAGNOSIS — I639 Cerebral infarction, unspecified: Secondary | ICD-10-CM

## 2020-12-18 DIAGNOSIS — F17210 Nicotine dependence, cigarettes, uncomplicated: Secondary | ICD-10-CM | POA: Diagnosis present

## 2020-12-18 DIAGNOSIS — M5001 Cervical disc disorder with myelopathy,  high cervical region: Secondary | ICD-10-CM | POA: Diagnosis present

## 2020-12-18 DIAGNOSIS — M4804 Spinal stenosis, thoracic region: Secondary | ICD-10-CM | POA: Diagnosis present

## 2020-12-18 DIAGNOSIS — I10 Essential (primary) hypertension: Secondary | ICD-10-CM | POA: Diagnosis present

## 2020-12-18 DIAGNOSIS — Z7984 Long term (current) use of oral hypoglycemic drugs: Secondary | ICD-10-CM

## 2020-12-18 DIAGNOSIS — G9520 Unspecified cord compression: Secondary | ICD-10-CM

## 2020-12-18 DIAGNOSIS — E1143 Type 2 diabetes mellitus with diabetic autonomic (poly)neuropathy: Secondary | ICD-10-CM | POA: Diagnosis present

## 2020-12-18 DIAGNOSIS — Z90710 Acquired absence of both cervix and uterus: Secondary | ICD-10-CM

## 2020-12-18 DIAGNOSIS — M21371 Foot drop, right foot: Secondary | ICD-10-CM | POA: Diagnosis present

## 2020-12-18 DIAGNOSIS — F209 Schizophrenia, unspecified: Secondary | ICD-10-CM | POA: Diagnosis present

## 2020-12-18 MED ORDER — INSULIN DETEMIR 100 UNIT/ML ~~LOC~~ SOLN
40.0000 [IU] | Freq: Every day | SUBCUTANEOUS | Status: DC
  Administered 2020-12-19 – 2020-12-28 (×10): 40 [IU] via SUBCUTANEOUS
  Filled 2020-12-18 (×10): qty 0.4

## 2020-12-18 MED ORDER — ASPIRIN EC 81 MG PO TBEC
81.0000 mg | DELAYED_RELEASE_TABLET | Freq: Every day | ORAL | Status: DC
  Administered 2020-12-18 – 2020-12-19 (×2): 81 mg via ORAL
  Filled 2020-12-18 (×2): qty 1

## 2020-12-18 MED ORDER — QUETIAPINE FUMARATE 200 MG PO TABS
400.0000 mg | ORAL_TABLET | Freq: Once | ORAL | Status: AC
  Administered 2020-12-18: 400 mg via ORAL
  Filled 2020-12-18: qty 2

## 2020-12-18 MED ORDER — GABAPENTIN 300 MG PO CAPS
600.0000 mg | ORAL_CAPSULE | Freq: Three times a day (TID) | ORAL | Status: DC
  Administered 2020-12-18 – 2020-12-27 (×27): 600 mg via ORAL
  Filled 2020-12-18 (×28): qty 2

## 2020-12-18 MED ORDER — QUETIAPINE FUMARATE 200 MG PO TABS
400.0000 mg | ORAL_TABLET | Freq: Two times a day (BID) | ORAL | Status: DC
  Administered 2020-12-18 (×2): 400 mg via ORAL
  Filled 2020-12-18 (×2): qty 2

## 2020-12-18 MED ORDER — ROSUVASTATIN CALCIUM 20 MG PO TABS
40.0000 mg | ORAL_TABLET | Freq: Every day | ORAL | Status: DC
  Administered 2020-12-18 – 2020-12-28 (×11): 40 mg via ORAL
  Filled 2020-12-18 (×3): qty 2
  Filled 2020-12-18: qty 8
  Filled 2020-12-18 (×6): qty 2
  Filled 2020-12-18: qty 8

## 2020-12-18 MED ORDER — CLOPIDOGREL BISULFATE 75 MG PO TABS
75.0000 mg | ORAL_TABLET | Freq: Every day | ORAL | Status: DC
  Administered 2020-12-18: 75 mg via ORAL
  Filled 2020-12-18: qty 1

## 2020-12-18 MED ORDER — GLIPIZIDE ER 10 MG PO TB24
20.0000 mg | ORAL_TABLET | Freq: Every day | ORAL | Status: DC
  Administered 2020-12-19 – 2020-12-28 (×9): 20 mg via ORAL
  Filled 2020-12-18 (×10): qty 2

## 2020-12-18 MED ORDER — QUETIAPINE FUMARATE 100 MG PO TABS
800.0000 mg | ORAL_TABLET | Freq: Two times a day (BID) | ORAL | Status: DC
  Administered 2020-12-19 (×2): 800 mg via ORAL
  Filled 2020-12-18 (×2): qty 4

## 2020-12-18 NOTE — TOC Initial Note (Signed)
Transition of Care [facility]) - Initial/Assessment Note    Patient Details  Name: Ashley [name] MRN: [id number] Date of Birth: 1961-07-11  Transition of Care [facility]) CM/SW Contact:    Raina Mina, Elias-Fela Solis Phone Number: 12/18/2020, 2:31 PM  Clinical Narrative:  Patient is recommended for skilled nursing facility. Patient is accepting and willing to participate in rehab. Patient told CSW that she has her own apartment and lives on her on. Patient has support from her sister. Patient stated her goal is to return home after the completion of her therapy.                  Expected Discharge Plan: Skilled Nursing Facility Barriers to Discharge: Continued Medical Work up   Patient Goals and CMS Choice        Expected Discharge Plan and Services Expected Discharge Plan: [place]                                              Prior Living Arrangements/Services     Patient language and need for interpreter reviewed:: Yes Do you feel safe going back to the place where you live?: Yes      Need for Family Participation in Patient Care: Yes (Comment) Care giver support system in place?: Yes (comment)   Criminal Activity/Legal Involvement Pertinent to Current Situation/Hospitalization: No - Comment as needed  Activities of Daily Living      Permission Sought/Granted      Share Information with NAME: [place] granted to share info w Relationship: Sister  Permission granted to share info w Contact Information: [phone]  Emotional Assessment Appearance:: Appears stated age Attitude/Demeanor/Rapport: Engaged Affect (typically observed): Calm Orientation: : Oriented to Self,Oriented to Place,Oriented to  Time,Oriented to Situation   Psych Involvement: No (comment)  Admission diagnosis:  Weakness Patient Active Problem List   Diagnosis Date Noted  . Cerebrovascular accident ([place]) 12/18/2020  . Peripheral vascular disease ([place])  10/31/2020  . Right atrial enlargement 10/31/2020  . Sciatica 10/31/2020  . Tobacco user 10/31/2020  . Type 2 diabetes mellitus with diabetic autonomic (poly)neuropathy ([place]) 10/31/2020  . Vitamin D deficiency 10/31/2020  . Lumbar degenerative disc disease 09/18/2019  . Chronic midline low back pain without sciatica 10/15/2016  . Fall 10/15/2016  . Essential hypertension 08/04/2016  . Mixed dyslipidemia 08/04/2016  . History of stroke 07/02/2016  . Renal insufficiency 07/02/2016  . Bipolar affective disorder in remission ([place]) 07/02/2016  . Vaccine refused by patient 07/02/2016  . Herniated lumbar intervertebral disc 04/07/2016  . 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 Marijuana smoker 05/20/2013  . Hot flashes, menopausal 05/20/2013  . RBBB 05/20/2013  . Normocytic anemia 05/20/2013  . Thrombocytosis 05/20/2013  . Depression 05/02/2013  . Uncontrolled type 2 diabetes mellitus ([place]) 05/01/2013  . Hyperlipidemia 05/01/2013   PCP:  Elsie Stain, MD Pharmacy:   [place], [place] [address] [place] [address] Phone: [phone] Fax: [phone]  [facility] [place], [place] [address] Phone: [phone] Fax: [phone]  [place], [place] - [facility] [address] [place] [address] Phone: [phone] Fax: [phone]  [place], [place] [address] [place] [address] Phone: [phone] Fax: [phone]     Social Determinants of Health (SDOH) Interventions    Readmission Risk Interventions No flowsheet data found.

## 2020-12-18 NOTE — ED Notes (Signed)
Patients sister would like a callback with an update when able, Meredith Mody [phone]

## 2020-12-18 NOTE — ED Provider Notes (Signed)
[facility] EMERGENCY DEPARTMENT Provider Note   CSN: [id number] Arrival date & time: 12/18/20  1001     History Chief Complaint  Patient presents with  . Weakness    Ashley [name] is a 60 y.o. female.  60 year old female presents for rehab placement.  Patient diagnosed with a stroke almost 4 weeks ago.  Was left with resultant right-sided deficits.  Patient has been home up to this time and has been using a walker and at times does not need one.  Denies any new weakness.  Has not had any headache.  Chart review shows that she saw her physician yesterday who is recommending further outpatient work-up.  Was also seen by neurology who also has recommended other testing as well.  Patient here today at the urging of her sister who feels that the patient needs placement in rehab.  Patient states that she would not be here unless that was the case as he feels at her current baseline.        Past Medical History:  Diagnosis Date  . Anxiety   . Arthritis   . Bipolar 1 disorder ([place])   . Depression   . Diabetes mellitus without complication ([place])    Type II  . GERD (gastroesophageal reflux disease)   . Heart murmur    "nothing to worry about"  . HTN (hypertension)    not on medication  . Stroke [facility]) 11/2020    Patient Active Problem List   Diagnosis Date Noted  . Cerebrovascular accident ([place]) 12/18/2020  . Peripheral vascular disease ([place]) 10/31/2020  . Right atrial enlargement 10/31/2020  . Sciatica 10/31/2020  . Tobacco user 10/31/2020  . Type 2 diabetes mellitus with diabetic autonomic (poly)neuropathy ([place]) 10/31/2020  . Vitamin D deficiency 10/31/2020  . Lumbar degenerative disc disease 09/18/2019  . Chronic midline low back pain without sciatica 10/15/2016  . Fall 10/15/2016  . Essential hypertension 08/04/2016  . Mixed dyslipidemia 08/04/2016  . History of stroke 07/02/2016  . Renal insufficiency 07/02/2016  . Bipolar affective disorder in  remission ([place]) 07/02/2016  . Vaccine refused by patient 07/02/2016  . Herniated lumbar intervertebral disc 04/07/2016  . 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 Marijuana smoker 05/20/2013  . Hot flashes, menopausal 05/20/2013  . RBBB 05/20/2013  . Normocytic anemia 05/20/2013  . Thrombocytosis 05/20/2013  . Depression 05/02/2013  . Uncontrolled type 2 diabetes mellitus ([place]) 05/01/2013  . Hyperlipidemia 05/01/2013    Past Surgical History:  Procedure Laterality Date  . ABDOMINAL HYSTERECTOMY    . LUMBAR LAMINECTOMY/DECOMPRESSION MICRODISCECTOMY Left 04/07/2016   Procedure: Left Lumbar four-five Microdiskectomy;  Surgeon: Erline Levine, MD;  Location: [facility];  Service: Neurosurgery;  Laterality: Left;     OB History    Gravida  4   Para  3   Term  2   Preterm      AB  1   Living  4     SAB      IAB      Ectopic      Multiple  1   Live Births  4           Family History  Problem Relation Age of Onset  . Diabetes Mellitus II Father   . Diabetes Brother   . Healthy Mother   . Heart disease Neg Hx   . Stroke Neg Hx   . Kidney disease Neg Hx     Social History   Tobacco Use  .  Smoking status: Current Every Day Smoker    Packs/day: 0.25    Years: 39.00    Pack years: 9.75    Types: Cigarettes  . Smokeless tobacco: Never Used  Vaping Use  . Vaping Use: Never used  Substance Use Topics  . Alcohol use: No  . Drug use: Yes    Types: Cocaine    Comment: 04/06/16- last time    Home Medications Prior to Admission medications   Medication Sig Start Date End Date Taking? Authorizing Provider  aspirin EC 81 MG tablet Take 1 tablet (81 mg total) by mouth daily. Swallow whole. 11/12/20   Elsie Stain, MD  clopidogrel (PLAVIX) 75 MG tablet Take 1 tablet (75 mg total) by mouth daily. 11/26/20   Elsie Stain, MD  gabapentin (NEURONTIN) 300 MG capsule Take 2 capsules (600 mg total) by mouth 3 (three) times daily. 10/31/20 11/30/20  Elsie Stain, MD  glipiZIDE (GLUCOTROL XL) 10 MG 24 hr tablet Take 2 tablets (20 mg total) by mouth daily. 10/31/20   Elsie Stain, MD  insulin detemir (LEVEMIR) 100 UNIT/ML FlexPen Inject 40 Units into the skin daily. 11/12/20   Elsie Stain, MD  Insulin Pen Needle (TRUEPLUS PEN NEEDLES) 32G X 4 MM MISC Use to inject insulin. 11/12/20   Elsie Stain, MD  QUEtiapine (SEROQUEL) 400 MG tablet TAKE 1 TABLET BY MOUTH TWICE DAILY 10/31/20   Elsie Stain, MD  rosuvastatin (CRESTOR) 40 MG tablet One tablet daily 10/31/20   Elsie Stain, MD    Allergies    Patient has no known allergies.  Review of Systems   Review of Systems  All other systems reviewed and are negative.   Physical Exam Updated Vital Signs BP 129/82 (BP Location: Left Arm)   Pulse 81   Temp 98.2 F (36.8 C) (Oral)   Resp 18   SpO2 99%   Physical Exam Vitals and nursing note reviewed.  Constitutional:      General: She is not in acute distress.    Appearance: Normal appearance. She is well-developed. She is not toxic-appearing.  HENT:     Head: Normocephalic and atraumatic.  Eyes:     General: Lids are normal.     Conjunctiva/sclera: Conjunctivae normal.     Pupils: Pupils are equal, round, and reactive to light.  Neck:     Thyroid: No thyroid mass.     Trachea: No tracheal deviation.  Cardiovascular:     Rate and Rhythm: Normal rate and regular rhythm.     Heart sounds: Normal heart sounds. No murmur heard. No gallop.   Pulmonary:     Effort: Pulmonary effort is normal. No respiratory distress.     Breath sounds: Normal breath sounds. No stridor. No decreased breath sounds, wheezing, rhonchi or rales.  Abdominal:     General: Bowel sounds are normal. There is no distension.     Palpations: Abdomen is soft.     Tenderness: There is no abdominal tenderness. There is no rebound.  Musculoskeletal:        General: No tenderness. Normal range of motion.     Cervical back: Normal range of motion  and neck supple.  Skin:    General: Skin is warm and dry.     Findings: No abrasion or rash.  Neurological:     Mental Status: She is alert and oriented to person, place, and time.     GCS: GCS eye subscore is 4. GCS verbal  subscore is 5. GCS motor subscore is 6.     Cranial Nerves: No cranial nerve deficit.     Sensory: No sensory deficit.     Comments: 3 out of 5 strength right upper extremity.  5 of 5 strength bilateral lower extremities.  Speech normal.  No facial asymmetry.  Psychiatric:        Speech: Speech normal.        Behavior: Behavior normal.     ED Results / Procedures / Treatments   Labs (all labs ordered are listed, but only abnormal results are displayed) Labs Reviewed - No data to display  EKG EKG Interpretation  Date/Time:  Tuesday December 18 2020 10:05:35 EDT Ventricular Rate:  82 PR Interval:    QRS Duration: 92 QT Interval:  390 QTC Calculation: 456 R Axis:   -12 Text Interpretation: Sinus rhythm Ventricular premature complex Biatrial enlargement Low voltage, precordial leads Abnormal R-wave progression, early transition Confirmed by Lacretia Leigh ([id number]) on 12/18/2020 10:10:45 AM   Radiology No results found.  Procedures Procedures   Medications Ordered in ED Medications - No data to display  ED Course  I have reviewed the triage vital signs and the nursing notes.  Pertinent labs & imaging results that were available during my care of the patient were reviewed by me and considered in my medical decision making (see chart for details).    MDM Rules/Calculators/A&P                          Patient's vital signs are stable here.  She has no new acute medical complaints.  Have talked to transitions of care to help with disposition Final Clinical Impression(s) / ED Diagnoses Final diagnoses:  None    Rx / DC Orders ED Discharge Orders    None       Lacretia Leigh, MD 12/18/20 1044

## 2020-12-18 NOTE — Progress Notes (Signed)
Patient is limited to SNF placement due to her insurance. CSW sent off referrals to the following accepted by patients insurance    1. [facility] 2. [facility] 3. [facility] 4. [facility]  5. [facility]

## 2020-12-18 NOTE — ED Notes (Signed)
Help get patient on the monitor into a gown did ekg shown to Dr Zenia Resides patient is resting with call bell in reach placed patient on external cath and a brief

## 2020-12-18 NOTE — Patient Instructions (Signed)
Preventing High Cholesterol Cholesterol is a white, waxy substance similar to fat that the human body needs to help build cells. The liver makes all the cholesterol that a person's body needs. Having high cholesterol (hypercholesterolemia) increases your risk for heart disease and stroke. Extra or excess cholesterol comes from the food that you eat. High cholesterol can often be prevented with diet and lifestyle changes. If you already have high cholesterol, you can control it with diet, lifestyle changes, and medicines. How can high cholesterol affect me? If you have high cholesterol, fatty deposits (plaques) may build up on the walls of your blood vessels. The blood vessels that carry blood away from your heart are called arteries. Plaques make the arteries narrower and stiffer. This in turn can:  Restrict or block blood flow and cause blood clots to form.  Increase your risk for heart attack and stroke. What can increase my risk for high cholesterol? This condition is more likely to develop in people who:  Eat foods that are high in saturated fat or cholesterol. Saturated fat is mostly found in foods that come from animal sources.  Are overweight.  Are not getting enough exercise.  Have a family history of high cholesterol (familial hypercholesterolemia). What actions can I take to prevent this? Nutrition  Eat less saturated fat.  Avoid trans fats (partially hydrogenated oils). These are often found in margarine and in some baked goods, fried foods, and snacks bought in packages.  Avoid precooked or cured meat, such as bacon, sausages, or meat loaves.  Avoid foods and drinks that have added sugars.  Eat more fruits, vegetables, and whole grains.  Choose healthy sources of protein, such as fish, poultry, lean cuts of red meat, beans, peas, lentils, and nuts.  Choose healthy sources of fat, such as: ? Nuts. ? Vegetable oils, especially olive oil. ? Fish that have healthy fats,  such as omega-3 fatty acids. These fish include mackerel or salmon.   Lifestyle  Lose weight if you are overweight. Maintaining a healthy body mass index (BMI) can help prevent or control high cholesterol. It can also lower your risk for diabetes and high blood pressure. Ask your health care provider to help you with a diet and exercise plan to lose weight safely.  Do not use any products that contain nicotine or tobacco, such as cigarettes, e-cigarettes, and chewing tobacco. If you need help quitting, ask your health care provider. Alcohol use  Do not drink alcohol if: ? Your health care provider tells you not to drink. ? You are pregnant, may be pregnant, or are planning to become pregnant.  If you drink alcohol: ? Limit how much you use to:  0-1 drink a day for women.  0-2 drinks a day for men. ? Be aware of how much alcohol is in your drink. In the [place] one drink equals one 12 oz bottle of beer (355 mL), one 5 oz glass of wine (148 mL), or one 1 oz glass of hard liquor (44 mL). Activity  Get enough exercise. Do exercises as told by your health care provider.  Each week, do at least 150 minutes of exercise that takes a medium level of effort (moderate-intensity exercise). This kind of exercise: ? Makes your heart beat faster while allowing you to still be able to talk. ? Can be done in short sessions several times a day or longer sessions a few times a week. For example, on 5 days each week, you could walk fast or ride   your bike 3 times a day for 10 minutes each time.   Medicines  Your health care provider may recommend medicines to help lower cholesterol. This may be a medicine to lower the amount of cholesterol that your liver makes. You may need medicine if: ? Diet and lifestyle changes have not lowered your cholesterol enough. ? You have high cholesterol and other risk factors for heart disease or stroke.  Take over-the-counter and prescription medicines only as told by your  health care provider. General information  Manage your risk factors for high cholesterol. Talk with your health care provider about all your risk factors and how to lower your risk.  Manage other conditions that you have, such as diabetes or high blood pressure (hypertension).  Have blood tests to check your cholesterol levels at regular points in time as told by your health care provider.  Keep all follow-up visits as told by your health care provider. This is important. Where to find more information  American Heart Association: www.heart.org  National Heart, Lung, and Blood Institute: www.nhlbi.nih.gov Summary  High cholesterol increases your risk for heart disease and stroke. By keeping your cholesterol level low, you can reduce your risk for these conditions.  High cholesterol can often be prevented with diet and lifestyle changes.  Work with your health care provider to manage your risk factors, and have your blood tested regularly. This information is not intended to replace advice given to you by your health care provider. Make sure you discuss any questions you have with your health care provider. Document Revised: 07/05/2019 Document Reviewed: 07/05/2019 Elsevier Patient Education  2021 Elsevier Inc.  

## 2020-12-18 NOTE — ED Triage Notes (Signed)
Pt arrives to ED via [facility] with c/o of weakness from past last month. Pt had spoken with her MD today who recommended ER visit. Pts sister wants her seen due to no being able to care for self at home.

## 2020-12-18 NOTE — Assessment & Plan Note (Signed)
Recent MRI compatible with acute vascular stroke  Patient on Plavix and aspirin  Neurology has seen the patient and is pursuing MRI of the neck CT angiogram of the neck and head  Patient sister insisting she be taken to the hospital and was saying she would do this today

## 2020-12-18 NOTE — NC FL2 (Signed)
[place] MEDICAID FL2 LEVEL OF CARE SCREENING TOOL     IDENTIFICATION  Patient Name: Ashley [name] Birthdate: 1960/11/07 Sex: female Admission Date (Current Location): 12/18/2020  [facility] and [place] Number:  Herbalist and Address:  [facility], [place] [address] [place], [place] [address]      Provider Number: [id number]  Attending Physician Name and Address:  Lacretia Leigh, MD  Relative Name and Phone Number:  Neita Garnet, [phone]    Current Level of Care: SNF Recommended Level of Care: [place] Prior Approval Number:    Date Approved/Denied:   PASRR Number: [id number]  Discharge Plan: SNF    Current Diagnoses: Patient Active Problem List   Diagnosis Date Noted  . Cerebrovascular accident ([place]) 12/18/2020  . Peripheral vascular disease () 10/31/2020  . Right atrial enlargement 10/31/2020  . Sciatica 10/31/2020  . Tobacco user 10/31/2020  . Type 2 diabetes mellitus with diabetic autonomic (poly)neuropathy ([place]) 10/31/2020  . Vitamin D deficiency 10/31/2020  . Lumbar degenerative disc disease 09/18/2019  . Chronic midline low back pain without sciatica 10/15/2016  . Fall 10/15/2016  . Essential hypertension 08/04/2016  . Mixed dyslipidemia 08/04/2016  . History of stroke 07/02/2016  . Renal insufficiency 07/02/2016  . Bipolar affective disorder in remission ([place]) 07/02/2016  . Vaccine refused by patient 07/02/2016  . Herniated lumbar intervertebral disc 04/07/2016  . 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 Marijuana smoker 05/20/2013  . Hot flashes, menopausal 05/20/2013  . RBBB 05/20/2013  . Normocytic anemia 05/20/2013  . Thrombocytosis 05/20/2013  . Depression 05/02/2013  . Uncontrolled type 2 diabetes mellitus ([place]) 05/01/2013  . Hyperlipidemia 05/01/2013    Orientation RESPIRATION BLADDER Height & Weight     Self,Time,Situation,Place  Normal Incontinent Weight:   Height:      BEHAVIORAL SYMPTOMS/MOOD NEUROLOGICAL BOWEL NUTRITION STATUS      Continent    AMBULATORY STATUS COMMUNICATION OF NEEDS Skin   Limited Assist Verbally Normal                       Personal Care Assistance Level of Assistance  Bathing,Feeding,Dressing Bathing Assistance: Independent Feeding assistance: Independent Dressing Assistance: Independent     Functional Limitations Info  Sight,Hearing,Speech Sight Info: Adequate Hearing Info: Adequate Speech Info: Adequate    SPECIAL CARE FACTORS FREQUENCY                       Contractures Contractures Info: Not present    Additional Factors Info  Code Status Code Status Info: Full             Current Medications (12/18/2020):  This is the current hospital active medication list No current facility-administered medications for this encounter.   Current Outpatient Medications  Medication Sig Dispense Refill  . aspirin EC 81 MG tablet Take 1 tablet (81 mg total) by mouth daily. Swallow whole. 30 tablet 11  . clopidogrel (PLAVIX) 75 MG tablet Take 1 tablet (75 mg total) by mouth daily. 90 tablet 3  . gabapentin (NEURONTIN) 300 MG capsule Take 2 capsules (600 mg total) by mouth 3 (three) times daily. 180 capsule 1  . glipiZIDE (GLUCOTROL XL) 10 MG 24 hr tablet Take 2 tablets (20 mg total) by mouth daily. 90 tablet 0  . insulin detemir (LEVEMIR) 100 UNIT/ML FlexPen Inject 40 Units into the skin daily. 15 mL 11  . Insulin Pen Needle (TRUEPLUS PEN NEEDLES) 32G X 4 MM  MISC Use to inject insulin. 100 each 11  . QUEtiapine (SEROQUEL) 400 MG tablet TAKE 1 TABLET BY MOUTH TWICE DAILY 60 tablet 1  . rosuvastatin (CRESTOR) 40 MG tablet One tablet daily 60 tablet 4     Discharge Medications: Please see discharge summary for a list of discharge medications.  Relevant Imaging Results:  Relevant Lab Results:   Additional Information SSN: [id number]  Raina Mina, LCSWA

## 2020-12-18 NOTE — Progress Notes (Signed)
Subjective:    Patient ID: Ashley [name], female    DOB: April 25, 1961, 60 y.o.   MRN: [id number]  Virtual Visit via Telephone Note  I connected with [name] on 12/18/20 at  9:00 AM EDT by telephone and verified that I am speaking with the correct person using two identifiers.   Consent:  I discussed the limitations, risks, security and privacy concerns of performing an evaluation and management service by telephone and the availability of in person appointments. I also discussed with the patient that there may be a patient responsible charge related to this service. The patient expressed understanding and agreed to proceed.  Location of patient: Patient's at home  Location of provider: I am in my office  Persons participating in the televisit with the patient.   Toward the end of the visit the sister arrived on the call yelling and screaming    History of Present Illness: 60 y.o.F former [facility] PCP pt.  HTN, T2DM  10/31/2020 Telephone visit  60 year old female primary care patient of Dr. Chapman Fitch has not been seen in this office in some time.  Patient has history of hypertension type 2 diabetes.  She recently was moved into a temporary apartment when she had a Naval architect.  The current apartment is very stressful there are a lot of roaches is dirty its not well kept and she only has to stay here 1 more week then she will return home.  Patient does have history of bipolar disorder does see mental health provider is on high-dose Seroquel on a daily basis is compliant.  She states despite this she still smoking heavily and has not been able to quit smoking per recommendations.  The patient notes her blood sugars remain elevated in her home and she is under stress with this.  The patient's not been able to be compliant with either her diet or her medications for her diabetes.  11/12/20: Patient seen today in return follow-up for hypertension and uncontrolled type 2 diabetes.  On arrival  hemoglobin A1c was 9.5 blood glucose quite elevated.  She has not been compliant with NovoLog also not taking the Lantus she has been using the oral glipizide.  Patient notes some polyuria  Blood pressure on arrival is good at 110/68 and she is not on any blood pressure medications.  Patient had noted the onset of right-sided arm weakness and pain and numbness in the fingers she also has increasing balance difficulty falling frequently.  This just occurred several weeks ago.  It somewhat sudden onset.  She states her right leg also will give out as well.  She has had a history of strokes in the past.  She is smoking pack a day of cigarettes.  Patient also denies any headaches at this time.  Patient is followed by mental health provider is on the Seroquel for bipolar disorder. Patient states she is unsure and scared of getting the Covid vaccine.  She resolutely declines receiving pneumonia flu vaccines and tetanus vaccines at this visit.  She is willing to have her colon cancer status screen.  Patient continues to have significant anxiety.  PHQ-9 score is very high at this visit.  Patient denies any alcohol use at this time.  12/18/2020 This is a telephone visit I connected with this patient regarding her stroke and neurologic conditions.  Began the visit on the phone with the patient by herself in her home.  She had just seen neurology who felt that there could  be a cervical spine condition in addition to the previous stroke documented on the cerebral MRI.  The patient states she is down to 3 cigarettes a day of smoking.  She still has difficulty ambulating.  She has significant anxiety as well. Patient does maintain her insulin protocol.  The neurologist has ordered CT scan of her neck and brain with angiogram technique and also MRI has been ordered of the neck  As I was concluding the call with this patient her sister apparently arrived home and began yelling and screaming in the background  wanting her admitted to the hospital.  It did not appear based on the neurologist assessment she needed hospitalization but the sister continued to insist.  At the end of the visit I told them that I cannot admit her directly from the home due to the hospital current senses being high  Said if she wishes to be evaluated for admission they will have to call EMS to take her to the hospital for assessment.  At that point the visit concluded     Past Medical History:  Diagnosis Date  . Anxiety   . Arthritis   . Bipolar 1 disorder ([place])   . Depression   . Diabetes mellitus without complication ([place])    Type II  . GERD (gastroesophageal reflux disease)   . Heart murmur    "nothing to worry about"  . HTN (hypertension)    not on medication  . Stroke [facility]) 11/2020     Family History  Problem Relation Age of Onset  . Diabetes Mellitus II Father   . Diabetes Brother   . Healthy Mother   . Heart disease Neg Hx   . Stroke Neg Hx   . Kidney disease Neg Hx      Social History   Socioeconomic History  . Marital status: Divorced    Spouse name: Not on file  . Number of children: Not on file  . Years of education: Not on file  . Highest education level: Not on file  Occupational History  . Not on file  Tobacco Use  . Smoking status: Current Every Day Smoker    Packs/day: 0.25    Years: 39.00    Pack years: 9.75    Types: Cigarettes  . Smokeless tobacco: Never Used  Vaping Use  . Vaping Use: Never used  Substance and Sexual Activity  . Alcohol use: No  . Drug use: Yes    Types: Cocaine    Comment: 04/06/16- last time  . Sexual activity: Yes    Birth control/protection: Surgical  Other Topics Concern  . Not on file  Social History Narrative   Lives with daughter   Right Handed   Drinks 3-4 cups caffiene daily   Social Determinants of Health   Financial Resource Strain: Not on file  Food Insecurity: Not on file  Transportation Needs: Not on file  Physical  Activity: Not on file  Stress: Not on file  Social Connections: Not on file  Intimate Partner Violence: Not on file     No Known Allergies   Outpatient Medications Prior to Visit  Medication Sig Dispense Refill  . aspirin EC 81 MG tablet Take 1 tablet (81 mg total) by mouth daily. Swallow whole. 30 tablet 11  . clopidogrel (PLAVIX) 75 MG tablet Take 1 tablet (75 mg total) by mouth daily. 90 tablet 3  . gabapentin (NEURONTIN) 300 MG capsule Take 2 capsules (600 mg total) by mouth 3 (three) times  daily. 180 capsule 1  . glipiZIDE (GLUCOTROL XL) 10 MG 24 hr tablet Take 2 tablets (20 mg total) by mouth daily. 90 tablet 0  . insulin detemir (LEVEMIR) 100 UNIT/ML FlexPen Inject 40 Units into the skin daily. 15 mL 11  . Insulin Pen Needle (TRUEPLUS PEN NEEDLES) 32G X 4 MM MISC Use to inject insulin. 100 each 11  . QUEtiapine (SEROQUEL) 400 MG tablet TAKE 1 TABLET BY MOUTH TWICE DAILY 60 tablet 1  . rosuvastatin (CRESTOR) 40 MG tablet One tablet daily 60 tablet 4   No facility-administered medications prior to visit.    Review of Systems  Constitutional: Positive for fatigue.  HENT: Positive for hearing loss.   Eyes: Negative for visual disturbance.  Respiratory: Negative.   Cardiovascular: Negative.   Gastrointestinal: Negative.   Endocrine: Positive for polyuria.  Genitourinary: Negative.   Musculoskeletal: Positive for back pain and neck stiffness.       Hand pain   Neurological: Positive for dizziness, light-headedness and headaches.  Psychiatric/Behavioral: Positive for agitation, confusion, decreased concentration and dysphoric mood. The patient is nervous/anxious and is hyperactive.        Objective:   Physical Exam  There were no vitals filed for this visit. No exam this is a phone note    Assessment & Plan:  I personally reviewed all images and lab data in the [facility] system as well as any outside material available during this office visit and agree with the  radiology  impressions.   Cerebrovascular accident [facility]) Recent MRI compatible with acute vascular stroke  Patient on Plavix and aspirin  Neurology has seen the patient and is pursuing MRI of the neck CT angiogram of the neck and head  Patient sister insisting she be taken to the hospital and was saying she would do this today   Ardie was seen today for diabetes.  Diagnoses and all orders for this visit:  Cerebrovascular accident (CVA), unspecified mechanism ([place])   Follow Up Instructions: The sister indicated she would be taken the patient to the hospital for assessment   I discussed the assessment and treatment plan with the patient. The patient was provided an opportunity to ask questions and all were answered. The patient agreed with the plan and demonstrated an understanding of the instructions.   The patient was advised to call back or seek an in-person evaluation if the symptoms worsen or if the condition fails to improve as anticipated.  I provided 20 minutes of non-face-to-face time during this encounter  including  median intraservice time , review of notes, labs, imaging, medications  and explaining diagnosis and management to the patient .    Asencion Noble, MD

## 2020-12-18 NOTE — Discharge Planning (Signed)
RNCM consulted regarding safe discharge planning (Home with Home Health vs Skilled Nursing Facility Placement).  Physical Therapy evaluation placed; will follow up after recommendations from PT.     

## 2020-12-18 NOTE — Telephone Encounter (Signed)
[facility] auth for MR Cervical: [id number] (exp. 12/18/20 to 01/16/21) & CTA's Josem Kaufmann: [id number] (exp. 12/18/20 to 01/16/21)   Order sent to GI. They will reach out to the patient to schedule.

## 2020-12-18 NOTE — Evaluation (Addendum)
Physical Therapy Evaluation Patient Details Name: Ashley [name] MRN: [id number] DOB: October 16, 1960 Today's Date: 12/18/2020   History of Present Illness  Pt is a 60 y/o female presenting to the ED after falls and weakness on R side that has been going on for ~3 weeks. Per notes, pt had MRI outside of hospital that showed CVA. PMH includes DM, HTN, and bipolar disorder.  Clinical Impression  Pt admitted secondary to problem above with deficits below. Pt with RUE and RLE weakness secondary to CVA. Pt requiring min A for steadying throughout short distance mobility. Pt reports some falls at home. Feel she would benefit from SNF level therapies to increase independence and safety with mobility prior to return home. Pt is very motivated to regain her independence. Will continue to follow acutely.     Follow Up Recommendations SNF    Equipment Recommendations  None recommended by PT    Recommendations for Other Services OT consult     Precautions / Restrictions Precautions Precautions: Fall Restrictions Weight Bearing Restrictions: No      Mobility  Bed Mobility Overal bed mobility: Needs Assistance Bed Mobility: Supine to Sit;Sit to Supine     Supine to sit: Min assist Sit to supine: Supervision   General bed mobility comments: Extended time and effort to come to sitting at EOB.    Transfers Overall transfer level: Needs assistance Equipment used: None Transfers: Sit to/from Stand Sit to Stand: Min assist         General transfer comment: Min A for lift assist and steadying to stand.  Ambulation/Gait Ambulation/Gait assistance: Min assist Gait Distance (Feet): 15 Feet Assistive device: None Gait Pattern/deviations: Step-through pattern;Decreased stride length Gait velocity: Decreased   General Gait Details: Mild unsteadiness. Tended to hyperextend R knee for stability. Min A for steadying throughout.  Stairs            Wheelchair Mobility    Modified  Rankin (Stroke Patients Only) Modified Rankin (Stroke Patients Only) Pre-Morbid Rankin Score: Slight disability Modified Rankin: Moderately severe disability     Balance Overall balance assessment: Needs assistance Sitting-balance support: No upper extremity supported;Feet supported Sitting balance-Leahy Scale: Fair     Standing balance support: No upper extremity supported;During functional activity Standing balance-Leahy Scale: Fair Standing balance comment: Could maintain static standing without UE support                             Pertinent Vitals/Pain Pain Assessment: No/denies pain    Home Living Family/patient expects to be discharged to:: Private residence Living Arrangements: Alone Available Help at Discharge: Family Type of Home: Apartment Home Access: Stairs to enter Entrance Stairs-Rails: Right Entrance Stairs-Number of Steps: 12 Home Layout: One level Home Equipment: Environmental consultant - 2 wheels;Bedside commode      Prior Function Level of Independence: Independent with assistive device(s)         Comments: Has been using RW since weakess started.     Hand Dominance        Extremity/Trunk Assessment   Upper Extremity Assessment Upper Extremity Assessment: RUE deficits/detail RUE Deficits / Details: Significant RUE weakness. weak grip strength and only able to flex R shoulder to ~60 degrees.    Lower Extremity Assessment Lower Extremity Assessment: RLE deficits/detail RLE Deficits / Details: Grossly 3+/5 throughout RLE.    Cervical / Trunk Assessment Cervical / Trunk Assessment: Kyphotic  Communication   Communication: No difficulties  Cognition Arousal/Alertness: Awake/alert Behavior During  Therapy: WFL for tasks assessed/performed Overall Cognitive Status: No family/caregiver present to determine baseline cognitive functioning                                        General Comments      Exercises      Assessment/Plan    PT Assessment Patient needs continued PT services  PT Problem List Decreased strength;Decreased balance;Decreased mobility;Decreased knowledge of use of DME;Decreased safety awareness;Decreased knowledge of precautions       PT Treatment Interventions Gait training;DME instruction;Functional mobility training;Therapeutic activities;Therapeutic exercise;Balance training;Patient/family education    PT Goals (Current goals can be found in the Care Plan section)  Acute Rehab PT Goals Patient Stated Goal: to be independent PT Goal Formulation: With patient Time For Goal Achievement: 01/01/21 Potential to Achieve Goals: Good    Frequency Min 2X/week   Barriers to discharge        Co-evaluation               AM-PAC PT "6 Clicks" Mobility  Outcome Measure Help needed turning from your back to your side while in a flat bed without using bedrails?: A Little Help needed moving from lying on your back to sitting on the side of a flat bed without using bedrails?: A Little Help needed moving to and from a bed to a chair (including a wheelchair)?: A Little Help needed standing up from a chair using your arms (e.g., wheelchair or bedside chair)?: A Little Help needed to walk in hospital room?: A Little Help needed climbing 3-5 steps with a railing? : Total 6 Click Score: 16    End of Session Equipment Utilized During Treatment: Gait belt Activity Tolerance: Patient tolerated treatment well Patient left: in bed;with call bell/phone within reach (on stretcher in ED) Nurse Communication: Mobility status PT Visit Diagnosis: Unsteadiness on feet (R26.81);Hemiplegia and hemiparesis Hemiplegia - Right/Left: Right Hemiplegia - dominant/non-dominant: Dominant Hemiplegia - caused by: Cerebral infarction    Time: 1101-1116 PT Time Calculation (min) (ACUTE ONLY): 15 min   Charges:   PT Evaluation $PT Eval Moderate Complexity: 1 Mod          Reuel Derby, PT, DPT   Acute Rehabilitation Services  Pager: [phone] Office: [phone]   Rudean Hitt 12/18/2020, 1:06 PM

## 2020-12-18 NOTE — ED Notes (Signed)
Patient transported to MRI 

## 2020-12-19 ENCOUNTER — Emergency Department (HOSPITAL_COMMUNITY): Payer: 59

## 2020-12-19 ENCOUNTER — Other Ambulatory Visit: Payer: Self-pay | Admitting: Critical Care Medicine

## 2020-12-19 DIAGNOSIS — Z20822 Contact with and (suspected) exposure to covid-19: Secondary | ICD-10-CM | POA: Diagnosis not present

## 2020-12-19 DIAGNOSIS — I639 Cerebral infarction, unspecified: Secondary | ICD-10-CM | POA: Diagnosis not present

## 2020-12-19 DIAGNOSIS — K219 Gastro-esophageal reflux disease without esophagitis: Secondary | ICD-10-CM | POA: Diagnosis present

## 2020-12-19 DIAGNOSIS — Z7902 Long term (current) use of antithrombotics/antiplatelets: Secondary | ICD-10-CM | POA: Diagnosis not present

## 2020-12-19 DIAGNOSIS — Z79899 Other long term (current) drug therapy: Secondary | ICD-10-CM | POA: Diagnosis not present

## 2020-12-19 DIAGNOSIS — K5901 Slow transit constipation: Secondary | ICD-10-CM | POA: Diagnosis not present

## 2020-12-19 DIAGNOSIS — R296 Repeated falls: Secondary | ICD-10-CM | POA: Diagnosis present

## 2020-12-19 DIAGNOSIS — F3130 Bipolar disorder, current episode depressed, mild or moderate severity, unspecified: Secondary | ICD-10-CM | POA: Diagnosis not present

## 2020-12-19 DIAGNOSIS — E440 Moderate protein-calorie malnutrition: Secondary | ICD-10-CM | POA: Diagnosis not present

## 2020-12-19 DIAGNOSIS — G959 Disease of spinal cord, unspecified: Secondary | ICD-10-CM | POA: Diagnosis not present

## 2020-12-19 DIAGNOSIS — F203 Undifferentiated schizophrenia: Secondary | ICD-10-CM | POA: Diagnosis not present

## 2020-12-19 DIAGNOSIS — Z8673 Personal history of transient ischemic attack (TIA), and cerebral infarction without residual deficits: Secondary | ICD-10-CM | POA: Diagnosis not present

## 2020-12-19 DIAGNOSIS — E1165 Type 2 diabetes mellitus with hyperglycemia: Secondary | ICD-10-CM | POA: Diagnosis not present

## 2020-12-19 DIAGNOSIS — R531 Weakness: Secondary | ICD-10-CM | POA: Diagnosis not present

## 2020-12-19 DIAGNOSIS — G8250 Quadriplegia, unspecified: Secondary | ICD-10-CM | POA: Diagnosis present

## 2020-12-19 DIAGNOSIS — G9520 Unspecified cord compression: Secondary | ICD-10-CM | POA: Diagnosis not present

## 2020-12-19 DIAGNOSIS — Z7984 Long term (current) use of oral hypoglycemic drugs: Secondary | ICD-10-CM | POA: Diagnosis not present

## 2020-12-19 DIAGNOSIS — E785 Hyperlipidemia, unspecified: Secondary | ICD-10-CM | POA: Diagnosis not present

## 2020-12-19 DIAGNOSIS — Z7982 Long term (current) use of aspirin: Secondary | ICD-10-CM | POA: Diagnosis not present

## 2020-12-19 DIAGNOSIS — Z90710 Acquired absence of both cervix and uterus: Secondary | ICD-10-CM | POA: Diagnosis not present

## 2020-12-19 DIAGNOSIS — F17210 Nicotine dependence, cigarettes, uncomplicated: Secondary | ICD-10-CM | POA: Diagnosis present

## 2020-12-19 DIAGNOSIS — F3177 Bipolar disorder, in partial remission, most recent episode mixed: Secondary | ICD-10-CM

## 2020-12-19 DIAGNOSIS — M4802 Spinal stenosis, cervical region: Secondary | ICD-10-CM | POA: Diagnosis present

## 2020-12-19 DIAGNOSIS — M4804 Spinal stenosis, thoracic region: Secondary | ICD-10-CM | POA: Diagnosis not present

## 2020-12-19 DIAGNOSIS — Z4789 Encounter for other orthopedic aftercare: Secondary | ICD-10-CM | POA: Diagnosis not present

## 2020-12-19 DIAGNOSIS — M4712 Other spondylosis with myelopathy, cervical region: Secondary | ICD-10-CM | POA: Diagnosis present

## 2020-12-19 DIAGNOSIS — M5000 Cervical disc disorder with myelopathy, unspecified cervical region: Secondary | ICD-10-CM | POA: Diagnosis not present

## 2020-12-19 DIAGNOSIS — M5001 Cervical disc disorder with myelopathy,  high cervical region: Secondary | ICD-10-CM | POA: Diagnosis not present

## 2020-12-19 DIAGNOSIS — K5900 Constipation, unspecified: Secondary | ICD-10-CM | POA: Diagnosis present

## 2020-12-19 DIAGNOSIS — R64 Cachexia: Secondary | ICD-10-CM | POA: Diagnosis not present

## 2020-12-19 DIAGNOSIS — I6389 Other cerebral infarction: Secondary | ICD-10-CM | POA: Diagnosis not present

## 2020-12-19 DIAGNOSIS — Z9119 Patient's noncompliance with other medical treatment and regimen: Secondary | ICD-10-CM | POA: Diagnosis not present

## 2020-12-19 DIAGNOSIS — E1142 Type 2 diabetes mellitus with diabetic polyneuropathy: Secondary | ICD-10-CM | POA: Diagnosis not present

## 2020-12-19 DIAGNOSIS — R32 Unspecified urinary incontinence: Secondary | ICD-10-CM | POA: Diagnosis not present

## 2020-12-19 DIAGNOSIS — Z794 Long term (current) use of insulin: Secondary | ICD-10-CM | POA: Diagnosis not present

## 2020-12-19 DIAGNOSIS — G8918 Other acute postprocedural pain: Secondary | ICD-10-CM | POA: Diagnosis not present

## 2020-12-19 DIAGNOSIS — E1143 Type 2 diabetes mellitus with diabetic autonomic (poly)neuropathy: Secondary | ICD-10-CM | POA: Diagnosis not present

## 2020-12-19 DIAGNOSIS — F209 Schizophrenia, unspecified: Secondary | ICD-10-CM | POA: Diagnosis not present

## 2020-12-19 DIAGNOSIS — R339 Retention of urine, unspecified: Secondary | ICD-10-CM | POA: Diagnosis not present

## 2020-12-19 DIAGNOSIS — D62 Acute posthemorrhagic anemia: Secondary | ICD-10-CM | POA: Diagnosis not present

## 2020-12-19 DIAGNOSIS — N3946 Mixed incontinence: Secondary | ICD-10-CM | POA: Diagnosis not present

## 2020-12-19 DIAGNOSIS — I10 Essential (primary) hypertension: Secondary | ICD-10-CM | POA: Diagnosis not present

## 2020-12-19 HISTORY — DX: Unspecified cord compression: G95.20

## 2020-12-19 LAB — COMPREHENSIVE METABOLIC PANEL
ALT: 17 U/L (ref 0–44)
AST: 19 U/L (ref 15–41)
Albumin: 3.4 g/dL — ABNORMAL LOW (ref 3.5–5.0)
Alkaline Phosphatase: 90 U/L (ref 38–126)
Anion gap: 8 (ref 5–15)
BUN: 15 mg/dL (ref 8–23)
CO2: 26 mmol/L (ref 22–32)
Calcium: 9.2 mg/dL (ref 8.9–10.3)
Chloride: 105 mmol/L (ref 98–111)
Creatinine, Ser: 0.87 mg/dL (ref 0.44–1.00)
GFR, Estimated: >60 mL/min (ref >60)
Glucose, Bld: 171 mg/dL — ABNORMAL HIGH (ref 70–99)
Potassium: 3.7 mmol/L (ref 3.5–5.1)
Sodium: 139 mmol/L (ref 135–145)
Total Bilirubin: 0.7 mg/dL (ref 0.3–1.2)
Total Protein: 6.1 g/dL — ABNORMAL LOW (ref 6.5–8.1)

## 2020-12-19 LAB — CBC WITH DIFFERENTIAL/PLATELET
Abs Immature Granulocytes: 0.02 10*3/uL (ref 0.00–0.07)
Basophils Absolute: 0.1 10*3/uL (ref 0.0–0.1)
Basophils Relative: 1 %
Eosinophils Absolute: 0.4 10*3/uL (ref 0.0–0.5)
Eosinophils Relative: 6 %
HCT: 32.9 % — ABNORMAL LOW (ref 36.0–46.0)
Hemoglobin: 10.8 g/dL — ABNORMAL LOW (ref 12.0–15.0)
Immature Granulocytes: 0 %
Lymphocytes Relative: 41 %
Lymphs Abs: 2.6 10*3/uL (ref 0.7–4.0)
MCH: 30.8 pg (ref 26.0–34.0)
MCHC: 32.8 g/dL (ref 30.0–36.0)
MCV: 93.7 fL (ref 80.0–100.0)
Monocytes Absolute: 0.6 10*3/uL (ref 0.1–1.0)
Monocytes Relative: 9 %
Neutro Abs: 2.7 10*3/uL (ref 1.7–7.7)
Neutrophils Relative %: 43 %
Platelets: 365 10*3/uL (ref 150–400)
RBC: 3.51 MIL/uL — ABNORMAL LOW (ref 3.87–5.11)
RDW: 14.9 % (ref 11.5–15.5)
WBC: 6.4 10*3/uL (ref 4.0–10.5)
nRBC: 0 % (ref 0.0–0.2)

## 2020-12-19 LAB — RAPID URINE DRUG SCREEN, HOSP PERFORMED
Amphetamines: NOT DETECTED
Barbiturates: NOT DETECTED
Benzodiazepines: NOT DETECTED
Cocaine: NOT DETECTED
Opiates: NOT DETECTED
Tetrahydrocannabinol: POSITIVE — AB

## 2020-12-19 LAB — CBG MONITORING, ED: Glucose-Capillary: 162 mg/dL — ABNORMAL HIGH (ref 70–99)

## 2020-12-19 MED ORDER — INSULIN ASPART 100 UNIT/ML ~~LOC~~ SOLN
0.0000 [IU] | Freq: Three times a day (TID) | SUBCUTANEOUS | Status: DC
  Administered 2020-12-20 – 2020-12-21 (×2): 2 [IU] via SUBCUTANEOUS
  Administered 2020-12-21: 5 [IU] via SUBCUTANEOUS
  Administered 2020-12-22: 2 [IU] via SUBCUTANEOUS
  Administered 2020-12-22: 1 [IU] via SUBCUTANEOUS
  Administered 2020-12-22 – 2020-12-23 (×3): 2 [IU] via SUBCUTANEOUS
  Administered 2020-12-23: 3 [IU] via SUBCUTANEOUS
  Administered 2020-12-24 – 2020-12-25 (×4): 2 [IU] via SUBCUTANEOUS
  Administered 2020-12-26: 5 [IU] via SUBCUTANEOUS

## 2020-12-19 MED ORDER — HEPARIN SODIUM (PORCINE) 5000 UNIT/ML IJ SOLN
5000.0000 [IU] | Freq: Three times a day (TID) | INTRAMUSCULAR | Status: DC
  Administered 2020-12-19: 5000 [IU] via SUBCUTANEOUS
  Filled 2020-12-19 (×2): qty 1

## 2020-12-19 MED ORDER — SODIUM CHLORIDE 0.9% FLUSH
3.0000 mL | Freq: Two times a day (BID) | INTRAVENOUS | Status: DC
  Administered 2020-12-20 – 2020-12-27 (×10): 3 mL via INTRAVENOUS

## 2020-12-19 MED ORDER — IOHEXOL 350 MG/ML SOLN
75.0000 mL | Freq: Once | INTRAVENOUS | Status: AC | PRN
  Administered 2020-12-19: 75 mL via INTRAVENOUS

## 2020-12-19 MED ORDER — ONDANSETRON HCL 4 MG PO TABS
4.0000 mg | ORAL_TABLET | Freq: Four times a day (QID) | ORAL | Status: DC | PRN
  Filled 2020-12-19 (×2): qty 1

## 2020-12-19 MED ORDER — ACETAMINOPHEN 650 MG RE SUPP: 650.0000 mg | Freq: Four times a day (QID) | RECTAL | Status: DC | PRN

## 2020-12-19 MED ORDER — ONDANSETRON HCL 4 MG/2ML IJ SOLN: 4.0000 mg | Freq: Four times a day (QID) | INTRAMUSCULAR | Status: DC | PRN

## 2020-12-19 MED ORDER — ACETAMINOPHEN 325 MG PO TABS
650.0000 mg | ORAL_TABLET | Freq: Four times a day (QID) | ORAL | Status: DC | PRN
  Administered 2020-12-21 – 2020-12-27 (×5): 650 mg via ORAL
  Filled 2020-12-19 (×5): qty 2

## 2020-12-19 NOTE — Progress Notes (Signed)
CSW contacted [facility] to follow-up with referral. CSW left a message. Patient was declined a bed at [facility] and [facility].

## 2020-12-19 NOTE — Progress Notes (Signed)
CSW updated patient about placement progress. CSW let patient know there is one more placement CSW is waiting to hear back from. Patient requested to go home if facility does not respond by the time CSW leaves today.

## 2020-12-19 NOTE — ED Provider Notes (Signed)
I received the patient as a signout pending CT and MR imaging.  Subsequently MR C-spine showed severe spinal canal impingement at C3-C4 from disc bulging.  On my assessment patient was comfortably eating dinner using her left hand.  Right arm and leg with 3/5 strength diffusely, left arm and leg strength 4/5, minimal weakness.  She has mild neck pain, no radiculopathy.  I consulted Dr Venetia Constable from neurosurgery who will come evaluate the patient, and advised medical admission for likely surgical intervention.  There may need to be a washout period for the aspirin+ plavix.    I updated her sister Meredith Mody by phone.  Consult to hospital team placed.   Wyvonnia Dusky, MD 12/19/20 [phone]

## 2020-12-19 NOTE — Progress Notes (Signed)
CSW contacted [facility] again. [facility] Guardian told CSW that they needed some more information on patient. CSW faxed the needed health information for review. CSW is awaiting to see if patient will be accepted.

## 2020-12-19 NOTE — H&P (Signed)
History and Physical    Ashley [name] DJM:[id number] DOB: 1961-05-12 DOA: 12/18/2020  PCP: Elsie Stain, MD  Patient coming from: Home  I have personally briefly reviewed patient's old medical records in [place]  Chief Complaint: Frequent falls, weakness  HPI: Ashley [name] is a 60 y.o. female with medical history significant for insulin-dependent type 2 diabetes, hyperlipidemia, bipolar disorder, tobacco use, and recently diagnosed stroke who initially presented to the ED morning of 12/18/2020 for evaluation of progressive weakness.  Patient recently diagnosed with a stroke 1 month ago at which time she had symptoms of speech deficit and right upper and lower extremity weakness.  MRI brain obtained 11/24/2020 showed a small cortical left parietal stroke.  An old left-sided basal ganglia stroke was also noted.  She was subsequently started on aspirin and Plavix.  She was seen by neurology, Dr. Jannifer Franklin on 12/17/2020.  Patient was noted to have quadriparesis with right greater than left sided weakness.  Follow-up evaluation with MRI of the C-spine, CTA head/neck, and echocardiogram were recommended and ordered.  Patient came to the ED on morning of 12/18/2020 due to continued/progressive weakness.  She states that her speech deficits have now resolved.  She feels that the weakness in her right arm and leg are slightly improved compared to prior.  She reports chronic numbness/tingling in both of her hands.  She reports adherence to aspirin and Plavix.  Per MAR while in the ED, aspirin was last given around 1200 on 12/19/2020 and Plavix last given around 1700 on 12/18/2020.  ED Course:  Initially arrived to ED morning of 12/18/2020.  Vitals showed BP 141/84, pulse 78, RR 16, temp 98.2 F, SPO2 96% on room air.  Patient initially boarding for SNF placement however after further history obtained, subsequent neurological work-up was ordered as recommended by outpatient neurology.  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  Moderate spinal stenosis at C4-5 and mild spinal stenosis at C2-3 and C5-6 as well as severe right neuroforaminal stenosis at T1-2 also noted.  On-call neurosurgery, Dr. Venetia Constable was consulted and recommended medical admission for anticipated trip to the OR for ACDF on 12/24/2020 after aspirin/Plavix washout.  Labs shows WBC 6.4, hemoglobin 10.8, platelets 365,000, sodium 139, potassium 3.7, bicarb 26, BUN 15, creatinine 0.87, serum glucose 171, LFTs within normal limits.  SARS-CoV-2 PCR test is ordered and pending.  CTA head/neck ordered and pending.  The hospitalist service was consulted to admit for further evaluation and management.  Review of Systems: All systems reviewed and are negative except as documented in history of present illness above.   Past Medical History:  Diagnosis Date  . Anxiety   . Arthritis   . Bipolar 1 disorder ([place])   . Depression   . Diabetes mellitus without complication ([place])    Type II  . GERD (gastroesophageal reflux disease)   . Heart murmur    "nothing to worry about"  . HTN (hypertension)    not on medication  . Stroke [facility]) 11/2020    Past Surgical History:  Procedure Laterality Date  . ABDOMINAL HYSTERECTOMY    . LUMBAR LAMINECTOMY/DECOMPRESSION MICRODISCECTOMY Left 04/07/2016   Procedure: Left Lumbar four-five Microdiskectomy;  Surgeon: Erline Levine, MD;  Location: [facility];  Service: Neurosurgery;  Laterality: Left;    Social History:  reports that she has been smoking cigarettes. She has a 9.75  pack-year smoking history. She has never used smokeless tobacco. She reports current drug use. Drug: Cocaine. She reports that she does not drink alcohol.  No Known Allergies  Family History  Problem Relation Age of Onset  . Diabetes Mellitus II Father   .  Diabetes Brother   . Healthy Mother   . Heart disease Neg Hx   . Stroke Neg Hx   . Kidney disease Neg Hx      Prior to Admission medications   Medication Sig Start Date End Date Taking? Authorizing Provider  aspirin EC 81 MG tablet Take 1 tablet (81 mg total) by mouth daily. Swallow whole. 11/12/20  Yes Elsie Stain, MD  clopidogrel (PLAVIX) 75 MG tablet Take 1 tablet (75 mg total) by mouth daily. 11/26/20  Yes Elsie Stain, MD  gabapentin (NEURONTIN) 300 MG capsule Take 2 capsules (600 mg total) by mouth 3 (three) times daily. 10/31/20 11/30/20 Yes Elsie Stain, MD  glipiZIDE (GLUCOTROL XL) 10 MG 24 hr tablet Take 2 tablets (20 mg total) by mouth daily. 10/31/20  Yes Elsie Stain, MD  insulin detemir (LEVEMIR) 100 UNIT/ML FlexPen Inject 40 Units into the skin daily. 11/12/20  Yes Elsie Stain, MD  Insulin Pen Needle (TRUEPLUS PEN NEEDLES) 32G X 4 MM MISC Use to inject insulin. 11/12/20  Yes Elsie Stain, MD  rosuvastatin (CRESTOR) 40 MG tablet One tablet daily Patient taking differently: Take 40 mg by mouth daily. 10/31/20  Yes Elsie Stain, MD  QUEtiapine (SEROQUEL) 400 MG tablet TAKE ONE TABLET BY MOUTH TWICE DAILY 12/19/20   Elsie Stain, MD    Physical Exam: Vitals:   12/19/20 0456 12/19/20 0824 12/19/20 1234 12/19/20 1412  BP: 112/75 128/89 (!) 137/95 125/80  Pulse: 74 80 94 88  Resp: 18 16 16 16   Temp: (!) 97.5 F (36.4 C) 98.6 F (37 C)    TempSrc: Oral Oral    SpO2: 97% 98% 100% 100%   Constitutional: Resting supine in bed, NAD, calm, comfortable Eyes: PERRL, lids and conjunctivae normal ENMT: Mucous membranes are moist. Posterior pharynx clear of any exudate or lesions.Normal dentition.  Neck: normal, supple, no masses. Respiratory: clear to auscultation bilaterally, no wheezing, no crackles. Normal respiratory effort. No accessory muscle use.  Cardiovascular: Regular rate and rhythm, no murmurs / rubs / gallops. No extremity edema. 2+ pedal  pulses. Abdomen: no tenderness, no masses palpated. Bowel sounds positive.  Musculoskeletal: no clubbing / cyanosis. No joint deformity upper and lower extremities. Good ROM, no contractures. Normal muscle tone.  Skin: no rashes, lesions, ulcers. No induration Neurologic: CN 2-12 grossly intact. Sensation intact. Strength 3/5 in right upper and lower extremities, 4/5 left upper extremity, 5/5 left lower extremity.  Decreased grip strength right hand. Psychiatric: Normal judgment and insight. Alert and oriented x 3. Normal mood.   Labs on Admission: I have personally reviewed following labs and imaging studies  CBC: Recent Labs  Lab 12/19/20 1508  WBC 6.4  NEUTROABS 2.7  HGB 10.8*  HCT 32.9*  MCV 93.7  PLT 093   Basic Metabolic Panel: Recent Labs  Lab 12/19/20 1508  NA 139  K 3.7  CL 105  CO2 26  GLUCOSE 171*  BUN 15  CREATININE 0.87  CALCIUM 9.2   GFR: Estimated Creatinine Clearance: 66 mL/min (by C-G formula based on SCr of 0.87 mg/dL). Liver Function Tests: Recent Labs  Lab 12/19/20 1508  AST 19  ALT 17  ALKPHOS 90  BILITOT  0.7  PROT 6.1*  ALBUMIN 3.4*   No results for input(s): LIPASE, AMYLASE in the last 168 hours. No results for input(s): AMMONIA in the last 168 hours. Coagulation Profile: No results for input(s): INR, PROTIME in the last 168 hours. Cardiac Enzymes: No results for input(s): CKTOTAL, CKMB, CKMBINDEX, TROPONINI in the last 168 hours. BNP (last 3 results) No results for input(s): PROBNP in the last 8760 hours. HbA1C: No results for input(s): HGBA1C in the last 72 hours. CBG: Recent Labs  Lab 12/19/20 0819  GLUCAP 162*   Lipid Profile: No results for input(s): CHOL, HDL, LDLCALC, TRIG, CHOLHDL, LDLDIRECT in the last 72 hours. Thyroid Function Tests: No results for input(s): TSH, T4TOTAL, FREET4, T3FREE, THYROIDAB in the last 72 hours. Anemia Panel: No results for input(s): VITAMINB12, FOLATE, FERRITIN, TIBC, IRON, RETICCTPCT in the  last 72 hours. Urine analysis:    Component Value Date/Time   COLORURINE YELLOW 04/06/2016 1932   APPEARANCEUR CLEAR 04/06/2016 1932   LABSPEC 1.036 (H) 04/06/2016 1932   PHURINE 5.5 04/06/2016 1932   GLUCOSEU >1000 (A) 04/06/2016 1932   HGBUR NEGATIVE 04/06/2016 1932   BILIRUBINUR negative 06/04/2018 1101   KETONESUR negative 06/04/2018 1101   KETONESUR NEGATIVE 04/06/2016 1932   PROTEINUR NEGATIVE 04/06/2016 1932   UROBILINOGEN 0.2 06/04/2018 1101   UROBILINOGEN 0.2 11/13/2014 0942   NITRITE Positive (A) 06/04/2018 1101   NITRITE NEGATIVE 04/06/2016 1932   LEUKOCYTESUR Negative 06/04/2018 1101    Radiological Exams on Admission: MR Cervical Spine Wo Contrast  Result Date: 12/19/2020 CLINICAL DATA:  Myelopathy. History of stroke approximately 1 month ago. New unexplained left-sided numbness and weakness. EXAM: MRI CERVICAL SPINE WITHOUT CONTRAST TECHNIQUE: Multiplanar, multisequence MR imaging of the cervical spine was performed. No intravenous contrast was administered. COMPARISON:  None. FINDINGS: Alignment: Cervical spine straightening. Trace anterolisthesis of C4 on C5. Vertebrae: No fracture, suspicious osseous lesion, or significant marrow edema. Cord: Abnormal T2 hyperintensity in the spinal cord at C3 associated with a large disc extrusion. Posterior Fossa, vertebral arteries, paraspinal tissues: Asymmetric fatty atrophy of the right parotid gland. Preserved vertebral artery flow voids. No cerebellar tonsillar ectopia. Disc levels: C2-3: Minimal disc bulging, infolding of the ligamentum flavum, and right uncovertebral spurring result in mild spinal stenosis and mild right neural foraminal stenosis. C3-4: A large right paracentral disc extrusion with mild superior migration results in severe spinal stenosis and cord compression. Patent neural foramina. C4-5: Disc bulging, uncovertebral spurring, and mild facet arthrosis result in moderate spinal stenosis with mild cord flattening and  mild left neural foraminal stenosis. C5-6: Disc bulging and uncovertebral spurring result in mild spinal stenosis and moderate right and mild left neural foraminal stenosis. C6-7: Mild disc bulging and spurring without significant stenosis. C7-T1: Mild left facet arthrosis without stenosis. T1-2: A right foraminal disc protrusion and endplate spurring result in severe right neural foraminal stenosis. No spinal stenosis. IMPRESSION: 1. 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 2. Moderate spinal stenosis at C4-5 and mild spinal stenosis at C2-3 and C5-6. 3. Severe right neural foraminal stenosis at T1-2. Electronically Signed   By: Logan Bores M.D.   On: 12/19/2020 18:17    EKG: Personally reviewed. Sinus rhythm with PVC, low voltage, early R wave progression.  Sinus tachycardia resolved when compared to prior.  Assessment/Plan Principal Problem:   Cervical spinal cord compression (HCC) Active Problems:   Hyperlipidemia   History of stroke  Bipolar affective disorder in remission ([place])   Tobacco user   Type 2 diabetes mellitus with diabetic autonomic (poly)neuropathy ([place])   Ashley [name] is a 60 y.o. female with medical history significant for insulin-dependent type 2 diabetes, hyperlipidemia, bipolar disorder, tobacco use, and recently diagnosed stroke who is admitted with progressive quadriparesis secondary to severe C3-4 cervical spinal stenosis with cord compression.  Severe C3-4 cervical spinal stenosis with cord compression and progressive quadriparesis: -Neurosurgery following, plan for OR for ACDF on 12/24/2020 -Hold aspirin/Plavix -Continue neurochecks  Recently diagnosed posterior left parietal lobe ischemic stroke: Small cortical left parietal ischemic stroke seen on MRI brain 11/24/2020.  Aspirin/Plavix on hold as above.  CTA head/neck and echocardiogram  ordered for completion of CVA work-up.  Continue rosuvastatin.  Insulin-dependent type 2 diabetes: Continue home Levemir 40 units, add sensitive SSI.  Holding glipizide for now.  Last A1c 9.5% 11/12/2020.  Hyperlipidemia: Continue rosuvastatin.  Bipolar disorder: Continue Seroquel.  Tobacco use: Smoking 0.5-1 PPD.  Patient advised on smoking cessation.  She declines nicotine patch.   DVT prophylaxis: Subcutaneous heparin Code Status: Full code, confirmed with patient Family Communication: Discussed with patient, she has discussed with family Disposition Plan: From home, anticipate discharge to SNF versus home with [facility] PT after neurosurgical intervention Consults called: Neurosurgery Level of care: Telemetry Medical Admission status:  Status is: Inpatient  Remains inpatient appropriate because:Unsafe d/c plan and Inpatient level of care appropriate due to severity of illness   Dispo: The patient is from: Home              Anticipated d/c is to: Home versus SNF              Patient currently is not medically stable to d/c.   Zada Finders MD [facility]  If 7PM-7AM, please contact night-coverage [email or web address]  12/19/2020, 8:41 PM

## 2020-12-19 NOTE — Consult Note (Signed)
Neurosurgery Consultation  Reason for Consult: Progressive weakness Referring Physician: Maryan Rued  CC: Right sided weakness  HPI: This is a 60 y.o. woman with progressive quadriparesis. She previously had a stroke diagnosed 4 weeks ago. Sx at that time were nonsensical speech and RUE/RLE weakness. The speech has improved but the weakness has persisted. She was seen by Dr. Jannifer Franklin as an outpatient, who was rightfully concerned about her neurologic exam and sent her for stat MRI C-spine. Of note, this is a great pick up by Dr. Jannifer Franklin, as her weakness is predominantly right sided. She is currently complaining of RUE/RLE > LUE/LLE weakness with numbness worse distally than proximally more in the UE than LE with weakness. Her speech feels normal to her / back to baseline. Of note, given her prior stroke, she is on DAPT.   ROS: A 14 point ROS was performed and is negative except as noted in the HPI.   PMHx:  Past Medical History:  Diagnosis Date  . Anxiety   . Arthritis   . Bipolar 1 disorder ([place])   . Depression   . Diabetes mellitus without complication ([place])    Type II  . GERD (gastroesophageal reflux disease)   . Heart murmur    "nothing to worry about"  . HTN (hypertension)    not on medication  . Stroke [facility]) 11/2020   FamHx:  Family History  Problem Relation Age of Onset  . Diabetes Mellitus II Father   . Diabetes Brother   . Healthy Mother   . Heart disease Neg Hx   . Stroke Neg Hx   . Kidney disease Neg Hx    SocHx:  reports that she has been smoking cigarettes. She has a 9.75 pack-year smoking history. She has never used smokeless tobacco. She reports current drug use. Drug: Cocaine. She reports that she does not drink alcohol.  Exam: Vital signs in last 24 hours: Temp:  [97.5 F (36.4 C)-98.6 F (37 C)] 98.6 F (37 C) (03/16 0824) Pulse Rate:  [74-94] 88 (03/16 1412) Resp:  [16-22] 16 (03/16 1412) BP: (112-144)/(75-95) 125/80 (03/16 1412) SpO2:  [96 %-100 %] 100 %  (03/16 1412) General: Awake, alert, cooperative, sitting in wheelchair in NAD Head: Normocephalic and atruamatic HEENT: Neck supple Pulmonary: breathing room air comfortably, no evidence of increased work of breathing Cardiac: RRR Abdomen: S NT ND Extremities: Warm and well perfused x4 Neuro: AOx3, PERRL, EOMI, FS, speech fluent w/ normal content Strength 3 to 4-/5 in RUE and RLE, 4/5 in LUE/LLE with left hand numbness greater than right hand, right leg numbness greater than left leg numbness, +hoffman's on the left, absent on the right, reflexes 1+ at the knees, 2+ at R biceps, 1+ at L biceps   Assessment and Plan: 60 y.o. woman with progressive quadriparesis and numbness in the setting of recent left parietal stroke. Language deficit at that time, pt is right handed, which has resolved, but weakness has persisted. MRI C-spine personally reviewed, which shows very large disc herniation with severe cord compression and cord signal change at C3-4, moderate stenosis at C4-5, severe foraminal stenosis on the right at T1-2.  -again, have to reiterate that this is a great pickup by Dr. Jannifer Franklin, not an easy diagnosis to make -will require surgical treatment, given that it has been present for more than a few weeks, will need to hold her DAPT and let itwear off preop, plan for OR for ACDF on 3/21 -will continue to follow, if exam worsens then  will be forced to take her sooner but this is hopefully unlikely   Judith Part, MD 12/19/20 6:51 PM [facility]

## 2020-12-19 NOTE — ED Notes (Signed)
Called [facility] to check on availability of room.  Staff reports EVS has not yet cleaned room.

## 2020-12-19 NOTE — Progress Notes (Signed)
[facility] not willing to take patient due to her seroquel dosage.

## 2020-12-19 NOTE — ED Notes (Signed)
Report called to Al, RN on [facility]N.  He reports a delay in transfer because room needs to be cleaned.  States he will call once room is ready.

## 2020-12-19 NOTE — ED Notes (Addendum)
Attempted to call report.  RN states he is with another patient and will call back shortly.

## 2020-12-19 NOTE — Progress Notes (Signed)
CSW updated patient on placement. Patients sister Nicki Reaper was present in the room. Patient gave CSW permission to speak freely. CSW updated family about limited SNF placement due to patients insurance. CSW informed family she is waiting to hear back from [facility] about placement. Family asked if it was possible for home health and outpatient rehab if patient cannot get into a facility. Family also asked for the places that did not have beds can they get the name and contact them in the future if patient goes home. Family also asked about transportation to and from rehab. CSW stated they would have to take her or if her insurance provides any services. CSW stated she would update family once [facility] contacts CSW back.

## 2020-12-19 NOTE — Progress Notes (Signed)
CSW spoke with Maudie Mercury from [facility]. Kim told CSW that she will review patient for placement.

## 2020-12-19 NOTE — Progress Notes (Signed)
CSW contacted [facility] and the website stated facility is permanently closed. Phone just rang when CSW called facility. CSW also contacted [facility] and left a message in regards to referral that was sent.

## 2020-12-19 NOTE — ED Notes (Signed)
Pt ambulated to the BR unassisted tol well.

## 2020-12-19 NOTE — ED Notes (Signed)
1200 Pt ambulated to desk to make a telephone call. Pt tol. well

## 2020-12-19 NOTE — ED Notes (Signed)
Pt. Assisted to the bathroom. Bed linen changed. Pt. Resting.

## 2020-12-19 NOTE — Telephone Encounter (Signed)
Requested medication (s) are due for refill today: no  Requested medication (s) are on the active medication list:yes  Last refill: 11/28/2020  Future visit scheduled: yes  Notes to clinic:  this refill cannot be delegated    Requested Prescriptions  Pending Prescriptions Disp Refills   QUEtiapine (SEROQUEL) 400 MG tablet [Pharmacy Med Name: quetiapine 400 mg tablet] 60 tablet 0    Sig: TAKE ONE TABLET BY MOUTH TWICE DAILY      Not Delegated - Psychiatry:  Antipsychotics - Second Generation (Atypical) - quetiapine Failed - 12/19/2020  1:40 PM      Failed - This refill cannot be delegated      Failed - Last BP in normal range    BP Readings from Last 1 Encounters:  12/19/20 (!) 137/95          Passed - ALT in normal range and within 180 days    ALT  Date Value Ref Range Status  11/12/2020 15 0 - 32 IU/L Final          Passed - AST in normal range and within 180 days    AST  Date Value Ref Range Status  11/12/2020 12 0 - 40 IU/L Final          Passed - Completed PHQ-2 or PHQ-9 in the last 360 days      Passed - Valid encounter within last 6 months    Recent Outpatient Visits           Yesterday Cerebrovascular accident (CVA), unspecified mechanism ([place])   [place] Elsie Stain, MD   1 month ago Type 2 diabetes mellitus with hyperglycemia, with long-term current use of insulin ([place])   [place] Elsie Stain, MD   1 month ago Uncontrolled type 2 diabetes mellitus with hyperglycemia [facility])   [place] Elsie Stain, MD   4 months ago Encounter for smoking cessation counseling   [place], Annie Main L, RPH-CPP   6 months ago Type 2 diabetes mellitus with hyperglycemia, with long-term current use of insulin ([place])   [place] Antony Blackbird, MD       Future Appointments             In 1  month Pieter Partridge, DO [facility]

## 2020-12-19 NOTE — ED Provider Notes (Signed)
Patient is a 60 year old female with history of stroke approximately 4 weeks ago who was here due to worsening function at home with frequent falls and inability to care for herself at home.  Patient had wheezing recently seen neurologist Dr. Jannifer Franklin on 12/17/2020 at that time she had full neurologic exam that showed her right sided weakness which was present during hospitalization but also some new unexplained left-sided numbness with weakness.  She was having some slurred speech at home but that has resolved and has not been present here.  Dr. Jannifer Franklin had ordered patient to have a CTA of her head, neck and cervical MRI due to the unexplained deficits found on exam.  Patient sister called today to get an update on what medical evaluation the patient had undergone.  However this information was not relayed to the initial provider and patient had not had any medical work-up.  Based on the insurance status that she has patient is a difficult placement.  However because there could be potentially new issues going on we will do the imaging today as well as baseline blood work.  Will discuss patient with Dr. Langston Masker who can follow-up on images and update patient's sister Meredith Mody at [place], [place], MD 12/19/20 [phone]

## 2020-12-20 ENCOUNTER — Encounter (HOSPITAL_COMMUNITY): Payer: Self-pay | Admitting: Internal Medicine

## 2020-12-20 ENCOUNTER — Other Ambulatory Visit (HOSPITAL_COMMUNITY): Payer: 59

## 2020-12-20 DIAGNOSIS — E440 Moderate protein-calorie malnutrition: Secondary | ICD-10-CM | POA: Insufficient documentation

## 2020-12-20 LAB — RESP PANEL BY RT-PCR (FLU A&B, COVID) ARPGX2
Influenza A by PCR: NEGATIVE
Influenza B by PCR: NEGATIVE
SARS Coronavirus 2 by RT PCR: NEGATIVE

## 2020-12-20 LAB — BASIC METABOLIC PANEL
Anion gap: 11 (ref 5–15)
BUN: 18 mg/dL (ref 8–23)
CO2: 20 mmol/L — ABNORMAL LOW (ref 22–32)
Calcium: 9.2 mg/dL (ref 8.9–10.3)
Chloride: 108 mmol/L (ref 98–111)
Creatinine, Ser: 0.95 mg/dL (ref 0.44–1.00)
GFR, Estimated: >60 mL/min (ref >60)
Glucose, Bld: 88 mg/dL (ref 70–99)
Potassium: 4.1 mmol/L (ref 3.5–5.1)
Sodium: 139 mmol/L (ref 135–145)

## 2020-12-20 LAB — CBC
HCT: 34.7 % — ABNORMAL LOW (ref 36.0–46.0)
Hemoglobin: 11.6 g/dL — ABNORMAL LOW (ref 12.0–15.0)
MCH: 30.9 pg (ref 26.0–34.0)
MCHC: 33.4 g/dL (ref 30.0–36.0)
MCV: 92.5 fL (ref 80.0–100.0)
Platelets: 334 10*3/uL (ref 150–400)
RBC: 3.75 MIL/uL — ABNORMAL LOW (ref 3.87–5.11)
RDW: 14.8 % (ref 11.5–15.5)
WBC: 7.6 10*3/uL (ref 4.0–10.5)
nRBC: 0.4 % — ABNORMAL HIGH (ref 0.0–0.2)

## 2020-12-20 LAB — GLUCOSE, CAPILLARY
Glucose-Capillary: 181 mg/dL — ABNORMAL HIGH (ref 70–99)
Glucose-Capillary: 113 mg/dL — ABNORMAL HIGH (ref 70–99)
Glucose-Capillary: 59 mg/dL — ABNORMAL LOW (ref 70–99)
Glucose-Capillary: 131 mg/dL — ABNORMAL HIGH (ref 70–99)

## 2020-12-20 LAB — HIV ANTIBODY (ROUTINE TESTING W REFLEX): HIV Screen 4th Generation wRfx: NONREACTIVE

## 2020-12-20 MED ORDER — QUETIAPINE FUMARATE 100 MG PO TABS
400.0000 mg | ORAL_TABLET | Freq: Two times a day (BID) | ORAL | Status: DC
Start: 2020-12-20 — End: 2020-12-28
  Administered 2020-12-20 – 2020-12-28 (×16): 400 mg via ORAL
  Filled 2020-12-20 (×16): qty 4

## 2020-12-20 MED ORDER — ENSURE ENLIVE PO LIQD
237.0000 mL | Freq: Two times a day (BID) | ORAL | Status: DC
  Administered 2020-12-21 – 2020-12-28 (×11): 237 mL via ORAL

## 2020-12-20 MED ORDER — ADULT MULTIVITAMIN W/MINERALS CH
1.0000 | ORAL_TABLET | Freq: Every day | ORAL | Status: DC
  Administered 2020-12-20 – 2020-12-28 (×8): 1 via ORAL
  Filled 2020-12-20 (×8): qty 1

## 2020-12-20 MED ORDER — ENOXAPARIN SODIUM 40 MG/0.4ML ~~LOC~~ SOLN
40.0000 mg | SUBCUTANEOUS | Status: DC
  Administered 2020-12-20 – 2020-12-23 (×4): 40 mg via SUBCUTANEOUS
  Filled 2020-12-20 (×4): qty 0.4

## 2020-12-20 NOTE — Progress Notes (Signed)
PROGRESS NOTE    Ashley [name]  IOX:[id number] DOB: 05/17/1961 DOA: 12/18/2020 PCP: Elsie Stain, MD    Chief Complaint  Patient presents with  . Weakness    Brief Narrative:  Ashley [name] is a 60 y.o. female with medical history significant for insulin-dependent type 2 diabetes, hyperlipidemia, bipolar disorder, tobacco use, and recently diagnosed stroke who initially presented to the ED morning of 12/18/2020 for evaluation of progressive weakness.  Found to have severe C3-4 cervical spinal stenosis with cord compression, hold DAPT, plan for OR for ACDF on 12/24/2020  Subjective:   Reports mild neck pain when looking up, not able to move right arm, but able to ambulate  Assessment & Plan:   Principal Problem:   Cervical spinal cord compression (HCC) Active Problems:   Hyperlipidemia   History of stroke   Bipolar affective disorder in remission ([place])   Tobacco user   Type 2 diabetes mellitus with diabetic autonomic (poly)neuropathy (HCC)   Malnutrition of moderate degree  Sever C3-4 cervical spinal stenosis with cord compression -Hold aspirin/Plavix -Neurosurgery following, plan for ACDF on 12/24/2020 -fall precautions  Posterior left parietal lobe ischemic stroke Small cortical left parietal ischemic stroke seen on MRI brain 11/24/2020.   Aspirin/Plavix on hold as above Continue statin  Insulin-dependent type 2 diabetes, uncontrolled, A1c 9.5% in 11/2020 Continue home Levemir 40 units, add sensitive SSI  Bipolar disorder Continue Seroquel, she is pleasant and cooperative  UDS + THC  Tobacco use: Smoking 0.5-1 PPD.  Patient advised on smoking cessation.  She declines nicotine patch  Nutritional Assessment: The patient's BMI is: Body mass index is 22.48 kg/m.Mar[place] Kitchen Seen by dietician.  I agree with the assessment and plan as outlined below: Nutrition Status: Nutrition Problem: Moderate Malnutrition Etiology: social / environmental circumstances (inadequate  intake due to unsanitary living conditions) Signs/Symptoms: percent weight loss,moderate muscle depletion,mild fat depletion,mild muscle depletion,moderate fat depletion Percent weight loss: 15 % Interventions: Ensure Enlive (each supplement provides 350kcal and 20 grams of protein),MVI  .   Unresulted Labs (From admission, onward)          Start     Ordered   12/19/20 1838  Resp Panel by RT-PCR (Flu A&B, Covid) Nasopharyngeal Swab  (Tier 2 - Symptomatic/asymptomatic with Precautions )  ONCE - STAT,   STAT       Question Answer Comment  Is this test for diagnosis or screening Screening   Symptomatic for COVID-19 as defined by CDC No   Hospitalized for COVID-19 No   Admitted to ICU for COVID-19 No   Previously tested for COVID-19 Yes   Resident in a congregate (group) care setting No   Employed in healthcare setting No   Pregnant No   Has patient completed COVID vaccination(s) (2 doses of Pfizer/Moderna 1 dose of [employer]) Unknown      12/19/20 1837            DVT prophylaxis: enoxaparin (LOVENOX) injection 40 mg Start: 12/20/20 1700   Code Status:full Family Communication: Patient Disposition:   Status is: Inpatient  Dispo: The patient is from: Home              Anticipated d/c is to: To be determined              Anticipated d/c date is: > 3 days                Consultants:   Neurosurgery  Procedures:   None  Antimicrobials:  None  Anti-infectives (From admission, onward)   None           Objective: Vitals:   12/20/20 0100 12/20/20 0138 12/20/20 0413 12/20/20 0752  BP:  113/73 (!) 152/95 (!) 138/92  Pulse:  89 76 87  Resp:  18 18 15   Temp:  98.7 F (37.1 C) 98.7 F (37.1 C) 98.4 F (36.9 C)  TempSrc:  Oral Oral Oral  SpO2:  100% 100% 100%  Weight: 65.1 kg     Height: 5\' 7"  (1.702 m)       Intake/Output Summary (Last 24 hours) at 12/20/2020 1626 Last data filed at 12/20/2020 1300 Gross per 24 hour  Intake 720 ml  Output --   Net 720 ml   Filed Weights   12/20/20 0100  Weight: 65.1 kg    Examination:  General exam: calm, NAD Respiratory system: Clear to auscultation. Respiratory effort normal. Cardiovascular system: S1 & S2 heard, RRR. No JVD, no murmur, No pedal edema. Gastrointestinal system: Abdomen is nondistended, soft and nontender.  Normal bowel sounds heard. Central nervous system: Alert and orientedx3, no able to move right amr Extremities: not able to move right arm Skin: No rashes, lesions or ulcers Psychiatry: Judgement and insight appear normal. Mood & affect appropriate.     Data Reviewed: I have personally reviewed following labs and imaging studies  CBC: Recent Labs  Lab 12/19/20 1508 12/20/20 0233  WBC 6.4 7.6  NEUTROABS 2.7  --   HGB 10.8* 11.6*  HCT 32.9* 34.7*  MCV 93.7 92.5  PLT 365 283    Basic Metabolic Panel: Recent Labs  Lab 12/19/20 1508 12/20/20 0233  NA 139 139  K 3.7 4.1  CL 105 108  CO2 26 20*  GLUCOSE 171* 88  BUN 15 18  CREATININE 0.87 0.95  CALCIUM 9.2 9.2    GFR: Estimated Creatinine Clearance: 60.5 mL/min (by C-G formula based on SCr of 0.95 mg/dL).  Liver Function Tests: Recent Labs  Lab 12/19/20 1508  AST 19  ALT 17  ALKPHOS 90  BILITOT 0.7  PROT 6.1*  ALBUMIN 3.4*    CBG: Recent Labs  Lab 12/19/20 0819 12/20/20 0951 12/20/20 1155  GLUCAP 162* 181* 113*     No results found for this or any previous visit (from the past 240 hour(s)).       Radiology Studies: CT Angio Head W or Wo Contrast  Result Date: 12/19/2020 CLINICAL DATA:  Initial evaluation for neuro deficit, right-sided weakness, slurred speech, recent stroke. EXAM: CT ANGIOGRAPHY HEAD AND NECK TECHNIQUE: Multidetector CT imaging of the head and neck was performed using the standard protocol during bolus administration of intravenous contrast. Multiplanar CT image reconstructions and MIPs were obtained to evaluate the vascular anatomy. Carotid stenosis  measurements (when applicable) are obtained utilizing NASCET criteria, using the distal internal carotid diameter as the denominator. CONTRAST:  58mL OMNIPAQUE IOHEXOL 350 MG/ML SOLN COMPARISON:  Prior MRI from 11/23/2020. FINDINGS: CT HEAD FINDINGS Brain: Cerebral volume within normal limits for age. Remote lacunar infarct at the left basal ganglia again noted. Additional small remote left parietal infarct recently seen on prior MRI not well visualized by CT. No acute intracranial hemorrhage. No acute large vessel territory infarct. No mass lesion, midline shift or mass effect. Mild ex vacuo dilatation of the left lateral ventricle related to the chronic left basal ganglia infarct. No hydrocephalus. No extra-axial fluid collection. Mineralization about the bilateral basal ganglia noted. Vascular: No hyperdense vessel. Scattered vascular calcifications noted within  the carotid siphons. Skull: Scalp soft tissues and calvarium within normal limits. Sinuses: Paranasal sinuses and mastoid air cells are clear. Orbits: Globes and orbital soft tissues within normal limits. Review of the MIP images confirms the above findings CTA NECK FINDINGS Aortic arch: Visualized aortic arch of normal caliber with normal branch pattern. Irregular filling defect within the distal ascending aorta of measuring 1.5 x 0.9 cm consistent with thrombus, suspected to reflect changes of a recently ruptured plaque (series 10, image 8). Finding could serve as an embolic source. No hemodynamically significant stenosis seen about the origin the great vessels. Visualized subclavian arteries patent. Right carotid system: Right CCA patent from its origin to the bifurcation without stenosis. Mild eccentric plaque about the origin of the right ICA with no more than mild 25% stenosis by NASCET criteria. Right ICA patent distally without stenosis, dissection or occlusion. Left carotid system: Left CCA patent from its origin to the bifurcation without  stenosis. Mild atheromatous plaque about the origin of the left ICA without significant stenosis. Left ICA patent distally without stenosis, dissection or occlusion. Vertebral arteries: Both vertebral arteries arise from the subclavian arteries. Vertebral arteries patent without stenosis, dissection or occlusion. Skeleton: No visible acute osseous finding. No discrete or worrisome osseous lesions. Moderate multilevel cervical spondylosis. Other neck: No other acute soft tissue abnormality within the neck. No mass or adenopathy. Upper chest: Visualized upper chest demonstrates no other acute finding. Review of the MIP images confirms the above findings CTA HEAD FINDINGS Anterior circulation: Petrous segments patent bilaterally. Scattered atheromatous change within the carotid siphons with associated mild to moderate multifocal narrowing. A1 segments patent bilaterally. Normal anterior communicating artery complex. Anterior cerebral arteries patent to their distal aspects without stenosis. No M1 stenosis or occlusion. Normal MCA bifurcations. Distal MCA branches well perfused and symmetric. Posterior circulation: Both V4 segments patent to the vertebrobasilar junction without significant stenosis. Left PICA origin patent and normal. Right PICA not definitely visualized. Basilar patent to its distal aspect without stenosis. Superior cerebellar arteries patent bilaterally. Both PCAs primarily supplied via the basilar well perfused to their distal aspects. Venous sinuses: Patent. Anatomic variants: None significant.  No aneurysm. Review of the MIP images confirms the above findings IMPRESSION: CT HEAD IMPRESSION: 1. No acute intracranial abnormality. 2. Chronic left basal ganglia lacunar infarct. CTA HEAD AND NECK IMPRESSION: 1. Negative CTA for large vessel occlusion. 2. 1.5 x 0.9 cm irregular filling defect within the ascending aorta, consistent with thrombus, suspected to reflect changes of a recently ruptured plaque.  Finding could serve as an embolic source. 3. Atheromatous change about the carotid bifurcations and carotid siphons without high-grade or hemodynamically significant stenosis. 4. Otherwise wide patency of the major arterial vasculature of the head and neck. No large vessel occlusion. No other hemodynamically significant or correctable stenosis. Electronically Signed   By: Jeannine Boga M.D.   On: 12/19/2020 21:56   CT Angio Neck W and/or Wo Contrast  Result Date: 12/19/2020 CLINICAL DATA:  Initial evaluation for neuro deficit, right-sided weakness, slurred speech, recent stroke. EXAM: CT ANGIOGRAPHY HEAD AND NECK TECHNIQUE: Multidetector CT imaging of the head and neck was performed using the standard protocol during bolus administration of intravenous contrast. Multiplanar CT image reconstructions and MIPs were obtained to evaluate the vascular anatomy. Carotid stenosis measurements (when applicable) are obtained utilizing NASCET criteria, using the distal internal carotid diameter as the denominator. CONTRAST:  11mL OMNIPAQUE IOHEXOL 350 MG/ML SOLN COMPARISON:  Prior MRI from 11/23/2020. FINDINGS: CT HEAD FINDINGS Brain:  Cerebral volume within normal limits for age. Remote lacunar infarct at the left basal ganglia again noted. Additional small remote left parietal infarct recently seen on prior MRI not well visualized by CT. No acute intracranial hemorrhage. No acute large vessel territory infarct. No mass lesion, midline shift or mass effect. Mild ex vacuo dilatation of the left lateral ventricle related to the chronic left basal ganglia infarct. No hydrocephalus. No extra-axial fluid collection. Mineralization about the bilateral basal ganglia noted. Vascular: No hyperdense vessel. Scattered vascular calcifications noted within the carotid siphons. Skull: Scalp soft tissues and calvarium within normal limits. Sinuses: Paranasal sinuses and mastoid air cells are clear. Orbits: Globes and orbital soft  tissues within normal limits. Review of the MIP images confirms the above findings CTA NECK FINDINGS Aortic arch: Visualized aortic arch of normal caliber with normal branch pattern. Irregular filling defect within the distal ascending aorta of measuring 1.5 x 0.9 cm consistent with thrombus, suspected to reflect changes of a recently ruptured plaque (series 10, image 8). Finding could serve as an embolic source. No hemodynamically significant stenosis seen about the origin the great vessels. Visualized subclavian arteries patent. Right carotid system: Right CCA patent from its origin to the bifurcation without stenosis. Mild eccentric plaque about the origin of the right ICA with no more than mild 25% stenosis by NASCET criteria. Right ICA patent distally without stenosis, dissection or occlusion. Left carotid system: Left CCA patent from its origin to the bifurcation without stenosis. Mild atheromatous plaque about the origin of the left ICA without significant stenosis. Left ICA patent distally without stenosis, dissection or occlusion. Vertebral arteries: Both vertebral arteries arise from the subclavian arteries. Vertebral arteries patent without stenosis, dissection or occlusion. Skeleton: No visible acute osseous finding. No discrete or worrisome osseous lesions. Moderate multilevel cervical spondylosis. Other neck: No other acute soft tissue abnormality within the neck. No mass or adenopathy. Upper chest: Visualized upper chest demonstrates no other acute finding. Review of the MIP images confirms the above findings CTA HEAD FINDINGS Anterior circulation: Petrous segments patent bilaterally. Scattered atheromatous change within the carotid siphons with associated mild to moderate multifocal narrowing. A1 segments patent bilaterally. Normal anterior communicating artery complex. Anterior cerebral arteries patent to their distal aspects without stenosis. No M1 stenosis or occlusion. Normal MCA bifurcations.  Distal MCA branches well perfused and symmetric. Posterior circulation: Both V4 segments patent to the vertebrobasilar junction without significant stenosis. Left PICA origin patent and normal. Right PICA not definitely visualized. Basilar patent to its distal aspect without stenosis. Superior cerebellar arteries patent bilaterally. Both PCAs primarily supplied via the basilar well perfused to their distal aspects. Venous sinuses: Patent. Anatomic variants: None significant.  No aneurysm. Review of the MIP images confirms the above findings IMPRESSION: CT HEAD IMPRESSION: 1. No acute intracranial abnormality. 2. Chronic left basal ganglia lacunar infarct. CTA HEAD AND NECK IMPRESSION: 1. Negative CTA for large vessel occlusion. 2. 1.5 x 0.9 cm irregular filling defect within the ascending aorta, consistent with thrombus, suspected to reflect changes of a recently ruptured plaque. Finding could serve as an embolic source. 3. Atheromatous change about the carotid bifurcations and carotid siphons without high-grade or hemodynamically significant stenosis. 4. Otherwise wide patency of the major arterial vasculature of the head and neck. No large vessel occlusion. No other hemodynamically significant or correctable stenosis. Electronically Signed   By: Jeannine Boga M.D.   On: 12/19/2020 21:56   MR Cervical Spine Wo Contrast  Result Date: 12/19/2020 CLINICAL DATA:  Myelopathy.  History of stroke approximately 1 month ago. New unexplained left-sided numbness and weakness. EXAM: MRI CERVICAL SPINE WITHOUT CONTRAST TECHNIQUE: Multiplanar, multisequence MR imaging of the cervical spine was performed. No intravenous contrast was administered. COMPARISON:  None. FINDINGS: Alignment: Cervical spine straightening. Trace anterolisthesis of C4 on C5. Vertebrae: No fracture, suspicious osseous lesion, or significant marrow edema. Cord: Abnormal T2 hyperintensity in the spinal cord at C3 associated with a large disc  extrusion. Posterior Fossa, vertebral arteries, paraspinal tissues: Asymmetric fatty atrophy of the right parotid gland. Preserved vertebral artery flow voids. No cerebellar tonsillar ectopia. Disc levels: C2-3: Minimal disc bulging, infolding of the ligamentum flavum, and right uncovertebral spurring result in mild spinal stenosis and mild right neural foraminal stenosis. C3-4: A large right paracentral disc extrusion with mild superior migration results in severe spinal stenosis and cord compression. Patent neural foramina. C4-5: Disc bulging, uncovertebral spurring, and mild facet arthrosis result in moderate spinal stenosis with mild cord flattening and mild left neural foraminal stenosis. C5-6: Disc bulging and uncovertebral spurring result in mild spinal stenosis and moderate right and mild left neural foraminal stenosis. C6-7: Mild disc bulging and spurring without significant stenosis. C7-T1: Mild left facet arthrosis without stenosis. T1-2: A right foraminal disc protrusion and endplate spurring result in severe right neural foraminal stenosis. No spinal stenosis. IMPRESSION: 1. 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 2. Moderate spinal stenosis at C4-5 and mild spinal stenosis at C2-3 and C5-6. 3. Severe right neural foraminal stenosis at T1-2. Electronically Signed   By: Logan Bores M.D.   On: 12/19/2020 18:17        Scheduled Meds: . enoxaparin (LOVENOX) injection  40 mg Subcutaneous Q24H  . gabapentin  600 mg Oral TID  . glipiZIDE  20 mg Oral Daily  . insulin aspart  0-9 Units Subcutaneous TID WC  . insulin detemir  40 Units Subcutaneous Daily  . QUEtiapine  400 mg Oral BID  . rosuvastatin  40 mg Oral Daily  . sodium chloride flush  3 mL Intravenous Q12H   Continuous Infusions:   LOS: 1 day   Time spent: 6mins Greater than 50% of this time was spent in  counseling, explanation of diagnosis, planning of further management, and coordination of care.   Voice Recognition [employer] dictation system was used to create this note, attempts have been made to correct errors. Please contact the author with questions and/or clarifications.   Florencia Reasons, MD PhD FACP [facility]  Available via Epic secure chat 7am-7pm for nonurgent issues Please page for urgent issues To page the attending provider between 7A-7P or the covering provider during after hours 7P-7A, please log into the web site [email or web address] and access using universal [place] password for that web site. If you do not have the password, please call the hospital operator.    12/20/2020, 4:26 PM

## 2020-12-20 NOTE — Evaluation (Signed)
Occupational Therapy Evaluation Patient Details Name: Ashley [name] MRN: [id number] DOB: [birth year] Today's Date: 12/20/2020    History of Present Illness Pt is a 60 y/o female presenting to the ED after falls and weakness on R side that has been going on for ~3 weeks. Per notes, pt had MRI outside of hospital that showed CVA. PMH includes DM, HTN, and bipolar disorder.   Clinical Impression   Pt presents with decline in function and safety with ADLs and ADL mobility with impaired strength, balance, endurance and coordination, impaired safety awareness. PTA, pt reports that she lived with her children and was Ind with ADLs and ADL mobility with RW. Pt with RUE and RLE weakness secondary to CVA. Pt requiring mod A for LB ADLs and mod/min A for transfers with min A for balance/steadying for mobility to bathroom and standing at sink for ADLs. Pt reports some falls at home. Pt highly motivated to work with therapy to be able to return home  Follow Up Recommendations  CIR    Equipment Recommendations  3 in 1 bedside commode;Other (comment) (RW)    Recommendations for Other Services       Precautions / Restrictions Precautions Precautions: Fall Restrictions Weight Bearing Restrictions: No      Mobility Bed Mobility Overal bed mobility: Needs Assistance Bed Mobility: Supine to Sit     Supine to sit: Independent     General bed mobility comments: Extended time and effort to come to sitting at EOB.    Transfers Overall transfer level: Needs assistance Equipment used: None Transfers: Sit to/from Stand Sit to Stand: Mod assist;Min assist         General transfer comment: mod A initially from EOB, min A from toilet. pt unsteady    Balance Overall balance assessment: Needs assistance Sitting-balance support: No upper extremity supported;Feet supported Sitting balance-Leahy Scale: Fair     Standing balance support: No upper extremity supported;During functional  activity Standing balance-Leahy Scale: Poor Standing balance comment: pt unseatdy walking to bathroom, while standing at toilet for grooming/hygiene tasks                           ADL either performed or assessed with clinical judgement   ADL Overall ADL's : Needs assistance/impaired Eating/Feeding: Set up;Independent;Sitting   Grooming: Dance movement psychotherapist;Wash/dry hands;Standing;Min guard Grooming Details (indicate cue type and reason): pt unsteady Upper Body Bathing: Min guard;Sitting   Lower Body Bathing: Minimal assistance;Moderate assistance;Sitting/lateral leans;Sit to/from stand   Upper Body Dressing : Min guard;Sitting   Lower Body Dressing: Minimal assistance;Moderate assistance;Sitting/lateral leans;Sit to/from stand   Toilet Transfer: Minimal assistance;Ambulation;Comfort height toilet;Grab bars;Cueing for safety   Toileting- Clothing Manipulation and Hygiene: Minimal assistance;Sit to/from stand   Tub/ Shower Transfer: Minimal assistance;Ambulation;3 in 1;Grab bars;Cueing for safety   Functional mobility during ADLs: Moderate assistance;Minimal assistance;Cueing for safety;Cueing for sequencing       Vision Baseline Vision/History: Wears glasses Patient Visual Report: No change from baseline       Perception     Praxis      Pertinent Vitals/Pain Pain Assessment: No/denies pain     Hand Dominance Right   Extremity/Trunk Assessment Upper Extremity Assessment Upper Extremity Assessment: Generalized weakness;RUE deficits/detail RUE Deficits / Details: Significant RUE weakness. weak grip strength and only able to flex R shoulder to ~60 degrees. RUE Coordination: decreased fine motor   Lower Extremity Assessment Lower Extremity Assessment: Defer to PT evaluation  Communication Communication Communication: No difficulties   Cognition Arousal/Alertness: Awake/alert Behavior During Therapy: WFL for tasks assessed/performed Overall Cognitive  Status: No family/caregiver present to determine baseline cognitive functioning                                     General Comments   pt pleasant, cooperative and highly motivated to work with therapy to be able to return home    Exercises     Shoulder Instructions      Home Living Family/patient expects to be discharged to:: Private residence   Available Help at Discharge: Family Type of Home: Apartment Home Access: Stairs to enter Technical brewer of Steps: 12 Entrance Stairs-Rails: Right Home Layout: One level     Bathroom Shower/Tub: Teacher, early years/pre: Handicapped height     Home Equipment: Environmental consultant - 2 wheels;Bedside commode          Prior Functioning/Environment Level of Independence: Independent with assistive device(s)        Comments: Has been using RW since weakess started.        OT Problem List: Decreased strength;Impaired balance (sitting and/or standing);Impaired tone;Decreased safety awareness;Decreased activity tolerance;Decreased coordination;Decreased knowledge of use of DME or AE;Impaired UE functional use      OT Treatment/Interventions: Self-care/ADL training;DME and/or AE instruction;Therapeutic activities;Balance training;Therapeutic exercise;Neuromuscular education;Patient/family education    OT Goals(Current goals can be found in the care plan section) Acute Rehab OT Goals Patient Stated Goal: to be independent, go to rehab and go home OT Goal Formulation: With patient Time For Goal Achievement: 01/03/21 Potential to Achieve Goals: Good ADL Goals Pt Will Perform Grooming: with supervision;with set-up;standing Pt Will Perform Upper Body Bathing: with supervision;with set-up;with modified independence Pt Will Perform Lower Body Bathing: with min assist;with min guard assist Pt Will Perform Upper Body Dressing: with supervision;with set-up;with modified independence Pt Will Perform Lower Body Dressing:  with min assist;with min guard assist Pt Will Transfer to Toilet: with min assist;with min guard assist;ambulating Pt Will Perform Toileting - Clothing Manipulation and hygiene: with min guard assist;sit to/from stand Pt Will Perform Tub/Shower Transfer: with min guard assist;ambulating  OT Frequency: Min 2X/week   Barriers to D/C:            Co-evaluation              AM-PAC OT "6 Clicks" Daily Activity     Outcome Measure Help from another person eating meals?: None Help from another person taking care of personal grooming?: A Little Help from another person toileting, which includes using toliet, bedpan, or urinal?: A Lot Help from another person bathing (including washing, rinsing, drying)?: A Lot Help from another person to put on and taking off regular upper body clothing?: A Little Help from another person to put on and taking off regular lower body clothing?: A Lot 6 Click Score: 16   End of Session Equipment Utilized During Treatment: Gait belt  Activity Tolerance: Patient tolerated treatment well Patient left: in chair;with call bell/phone within reach  OT Visit Diagnosis: Unsteadiness on feet (R26.81);Other abnormalities of gait and mobility (R26.89);Muscle weakness (generalized) (M62.81);History of falling (Z91.81);Other symptoms and signs involving the nervous system (R29.898)                Time: 4166-0630 OT Time Calculation (min): 31 min Charges:  OT General Charges $OT Visit: 1 Visit OT Evaluation $OT Eval Moderate Complexity: 1  Mod OT Treatments $Self Care/Home Management : 8-22 mins    Emmit [facility] 12/20/2020, 1:32 PM

## 2020-12-20 NOTE — Progress Notes (Signed)
Rehab Admissions Coordinator Note:  Patient was screened by Cleatrice Burke for appropriateness for an Inpatient Acute Rehab Consult per OT recommendations. Noted planned surgery 3/21. We will follow up postoperatively to assist with planning dispo. I will hold on placing Rehab/CIR consult until after surgery 3/21. Please call me with any questions.   Cleatrice Burke RN MSN 12/20/2020, 1:54 PM  I can be reached at [phone].

## 2020-12-20 NOTE — Progress Notes (Signed)
Initial Nutrition Assessment  DOCUMENTATION CODES:   Non-severe (moderate) malnutrition in context of social or environmental circumstances  INTERVENTION:   -Ensure Enlive BID, each supplement provides 350 kcal, 20 grams protein   -MVI with minerals daily   NUTRITION DIAGNOSIS:   Moderate Malnutrition related to social / environmental circumstances (inadequate intake due to unsanitary living conditions) as evidenced by percent weight loss,moderate muscle depletion,mild fat depletion,mild muscle depletion,moderate fat depletion.  GOAL:   Patient will meet greater than or equal to 90% of their needs  MONITOR:   PO intake,Supplement acceptance,Skin,I & O's,Labs,Weight trends  REASON FOR ASSESSMENT:   Malnutrition Screening Tool    ASSESSMENT:   24 YOF admitted for cervical spinal cord compression. PMH of GERD, bipolar disorder, PVD, T2DM, HTN, depression, anxiety, stroke (11/2020), daily marijuana use.  Per MD notes, pt presents with progressive weakness. Pt uses a walker to ambulate at baseline. 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   Per chart meal documentation, pt has been consuming 100% of meals. Pt reports she has had a poor appetite for the past few months up until 2 weeks ago when she started staying with her daughter. Pt reports that her poor appetite was caused by a bug problem in her apartment. Pt reports a usual intake of:  Breakfast - bacon, eggs, fried potatoes  Lunch - sub sandwiches or [place] food  Dinner - similar to lunch  Pt reports that she typically does not consume snacks or oral nutrition supplements. Pt discussed with intern that she is willing to try oral nutrition supplements while admitted. Poor dentition was noted during NFPE, however pt reports that she does not have any chewing difficulties. Pt reports that she does have right sided  weakness from her stroke, however mentioned that she is able to eat fine with her left hand.   Intern noted weakness and depletions on pt's right side during NFPE.   Pt reports she has a walker at home to assist with ambulation, however noted that she doesn't use it often.   Pt reports a UBW of 160#, however mentioned that she feels she has lost 15# in the past few months. Pt's weights reviewed and show a weight loss of 15% in 6 months which is significant for time frame.   Meds reviewed. Labs reviewed: Corrected Calcium (9.68), CBG (162-512)  NUTRITION - FOCUSED PHYSICAL EXAM:  Flowsheet Row Most Recent Value  Orbital Region Mild depletion  Upper Arm Region Moderate depletion  [moderate depletion on R side, mild depletion on L]  Thoracic and Lumbar Region Mild depletion  Buccal Region Mild depletion  Temple Region Mild depletion  Clavicle Bone Region Mild depletion  Clavicle and Acromion Bone Region Mild depletion  Scapular Bone Region Mild depletion  Dorsal Hand Mild depletion  Patellar Region Moderate depletion  Anterior Thigh Region Moderate depletion  Posterior Calf Region Moderate depletion  Edema (RD Assessment) None  Hair Reviewed  Eyes Reviewed  Mouth Reviewed  [poor dentition]  Skin Reviewed  Nails Reviewed       Diet Order:   Diet Order            Diet heart healthy/carb modified Room service appropriate? Yes; Fluid consistency: Thin  Diet effective now                 EDUCATION NEEDS:   No education needs have been identified at this time  Skin:  Skin Assessment: Reviewed RN Assessment  Last BM:  Unknown.  Height:   Ht Readings from Last 1 Encounters:  12/20/20 5\' 7"  (1.702 m)    Weight:   Wt Readings from Last 1 Encounters:  12/20/20 65.1 kg    Ideal Body Weight:  63.6 kg  BMI:  Body mass index is 22.48 kg/m.  Estimated Nutritional Needs:   Kcal:  1700-1900  Protein:  85-100 g  Fluid:  >/=1.7 L    Salvadore Oxford, Dietetic  Intern 12/20/2020 4:13 PM

## 2020-12-20 NOTE — TOC Progression Note (Signed)
Transition of Care [facility]) - Progression Note    Patient Details  Name: Ashley [name] MRN: [id number] Date of Birth: 1961/03/28  [facility] CM/SW Contact  Joanne Chars, LCSW Phone Number: 12/20/2020, 9:46 AM  Clinical Narrative: CSW spoke with pt in room.  Sister Nicki Reaper on phone and permission given to speak with sister and daughter [name] as well.  Surgery scheduled for Monday, pt already aware of plan for SNF.  Choice document given.    Pt is vaccinated for covid but no booster.  Sister had questions about CIR and CSW discussed with her difference between CIR and SNF and that pt is currently recommended for SNF.  CSW will continue to follow for discharge needs.     Expected Discharge Plan: Skilled Nursing Facility Barriers to Discharge: Continued Medical Work up  Expected Discharge Plan and Services Expected Discharge Plan: [place]                                               Social Determinants of Health (SDOH) Interventions    Readmission Risk Interventions No flowsheet data found.

## 2020-12-20 NOTE — Progress Notes (Signed)
Physical Therapy Treatment Patient Details Name: AIJAH LATTNER MRN: [id number] DOB: July 21, 1961 Today's Date: 12/20/2020    History of Present Illness Pt is a 60 y/o female presenting to the ED after falls and weakness on R side that has been going on for ~3 weeks. Per notes, pt had MRI outside of hospital that showed L parietal CVA. 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 Pt was then admitted to hospital. PMH includes DM, HTN, and bipolar disorder.    PT Comments    Pt progressing towards goals. Continues to present with R sided weakness and instability. Pt requiring min to mod A for stability. Pt with one instance of R knee buckling and requiring mod A for stability. Pt continues to be motivated to regain independence. Now that she is admitted updated recommendations to CIR as she would benefit from intensive rehab to increase independence and safety. Pt pending surgery likely Monday 3/21, so will reassess following surgery. Will continue to follow acutely.   Follow Up Recommendations  CIR     Equipment Recommendations  Other (comment) (TBD)    Recommendations for Other Services OT consult     Precautions / Restrictions Precautions Precautions: Fall Restrictions Weight Bearing Restrictions: No    Mobility  Bed Mobility Overal bed mobility: Needs Assistance Bed Mobility: Rolling;Sidelying to Sit;Sit to Sidelying Rolling: Min assist Sidelying to sit: Min assist Supine to sit: Independent   Sit to sidelying: Min assist General bed mobility comments: Min A for assist with rolling and trunk assist. Increased time required.    Transfers Overall transfer level: Needs assistance Equipment used: None Transfers: Sit to/from Stand Sit to Stand: Mod assist;Min assist         General transfer comment: Required mod A for lift assist from bed. Min A from higher surfaces.  Ambulation/Gait Ambulation/Gait  assistance: Min assist;Mod assist Gait Distance (Feet): 30 Feet Assistive device: None Gait Pattern/deviations: Step-through pattern;Decreased stride length;Trendelenburg Gait velocity: Decreased   General Gait Details: Trendelenberg gait noted in RLE. Unsteady and requiring min up to mod A for stability. Pt with one instance of knee buckling and required mod A for stability.   Stairs             Wheelchair Mobility    Modified Rankin (Stroke Patients Only) Modified Rankin (Stroke Patients Only) Pre-Morbid Rankin Score: No symptoms Modified Rankin: Moderately severe disability     Balance Overall balance assessment: Needs assistance Sitting-balance support: No upper extremity supported;Feet supported Sitting balance-Leahy Scale: Fair     Standing balance support: No upper extremity supported Standing balance-Leahy Scale: Poor Standing balance comment: Reliant on external support                            Cognition Arousal/Alertness: Awake/alert Behavior During Therapy: WFL for tasks assessed/performed Overall Cognitive Status: No family/caregiver present to determine baseline cognitive functioning                                 General Comments: Overall appropriate throughout. Mild decreased awareness of safety.      Exercises      General Comments        Pertinent Vitals/Pain Pain Assessment: No/denies pain    Home Living Family/patient expects to be discharged to:: Private residence   Available Help at Discharge: Family Type of  Home: Apartment Home Access: Stairs to enter Entrance Stairs-Rails: Right Home Layout: One level Home Equipment: Jennings - 2 wheels;Bedside commode      Prior Function Level of Independence: Independent with assistive device(s)      Comments: Has been using RW since weakess started.   PT Goals (current goals can now be found in the care plan section) Acute Rehab PT Goals Patient Stated Goal:  to be independent, go to rehab and go home PT Goal Formulation: With patient Time For Goal Achievement: 01/01/21 Potential to Achieve Goals: Good Progress towards PT goals: Progressing toward goals    Frequency    Min 3X/week      PT Plan Discharge plan needs to be updated;Frequency needs to be updated;Equipment recommendations need to be updated    Co-evaluation              AM-PAC PT "6 Clicks" Mobility   Outcome Measure  Help needed turning from your back to your side while in a flat bed without using bedrails?: A Little Help needed moving from lying on your back to sitting on the side of a flat bed without using bedrails?: A Little Help needed moving to and from a bed to a chair (including a wheelchair)?: A Little Help needed standing up from a chair using your arms (e.g., wheelchair or bedside chair)?: A Lot Help needed to walk in hospital room?: A Lot Help needed climbing 3-5 steps with a railing? : Total 6 Click Score: 14    End of Session Equipment Utilized During Treatment: Gait belt Activity Tolerance: Patient tolerated treatment well Patient left: in bed;with call bell/phone within reach;with bed alarm set Nurse Communication: Mobility status PT Visit Diagnosis: Unsteadiness on feet (R26.81);Hemiplegia and hemiparesis Hemiplegia - Right/Left: Right Hemiplegia - dominant/non-dominant: Dominant Hemiplegia - caused by: Cerebral infarction     Time: 7322-0254 PT Time Calculation (min) (ACUTE ONLY): 16 min  Charges:  $Gait Training: 8-22 mins                     Lou Miner, DPT  Acute Rehabilitation Services  Pager: [phone] Office: [phone]    Rudean Hitt 12/20/2020, 1:57 PM

## 2020-12-20 NOTE — Progress Notes (Signed)
Respiratory panel collected and sent.

## 2020-12-20 NOTE — ED Notes (Signed)
Called [facility] and spoke to Financial controller.  Patient's room still needs cleaning.

## 2020-12-20 NOTE — Plan of Care (Signed)
  Problem: Education: Goal: Knowledge of General Education information will improve Description Including pain rating scale, medication(s)/side effects and non-pharmacologic comfort measures Outcome: Progressing   Problem: Activity: Goal: Risk for activity intolerance will decrease Outcome: Progressing   Problem: Safety: Goal: Ability to remain free from injury will improve Outcome: Progressing   

## 2020-12-20 NOTE — Plan of Care (Signed)
  Problem: Education: Goal: Knowledge of General Education information will improve Description: Including pain rating scale, medication(s)/side effects and non-pharmacologic comfort measures Outcome: Progressing   Problem: Clinical Measurements: Goal: Ability to maintain clinical measurements within normal limits will improve Outcome: Progressing   

## 2020-12-21 ENCOUNTER — Inpatient Hospital Stay (HOSPITAL_COMMUNITY): Payer: 59

## 2020-12-21 DIAGNOSIS — I6389 Other cerebral infarction: Secondary | ICD-10-CM

## 2020-12-21 LAB — ECHOCARDIOGRAM COMPLETE
AR max vel: 2.13 cm2
AV Area VTI: 2.43 cm2
AV Area mean vel: 2.04 cm2
AV Mean grad: 5 mmHg
AV Peak grad: 10 mmHg
Ao pk vel: 1.58 m/s
Area-P 1/2: 5.42 cm2
Height: 67 in
S' Lateral: 2.8 cm
Weight: 2296.31 oz

## 2020-12-21 LAB — GLUCOSE, CAPILLARY
Glucose-Capillary: 287 mg/dL — ABNORMAL HIGH (ref 70–99)
Glucose-Capillary: 106 mg/dL — ABNORMAL HIGH (ref 70–99)
Glucose-Capillary: 157 mg/dL — ABNORMAL HIGH (ref 70–99)
Glucose-Capillary: 104 mg/dL — ABNORMAL HIGH (ref 70–99)

## 2020-12-21 MED ORDER — TRAMADOL HCL 50 MG PO TABS
50.0000 mg | ORAL_TABLET | Freq: Once | ORAL | Status: AC
  Administered 2020-12-21: 50 mg via ORAL
  Filled 2020-12-21: qty 1

## 2020-12-21 MED ORDER — QUETIAPINE FUMARATE 100 MG PO TABS
400.0000 mg | ORAL_TABLET | Freq: Once | ORAL | Status: AC
  Administered 2020-12-21: 400 mg via ORAL
  Filled 2020-12-21: qty 4

## 2020-12-21 NOTE — Progress Notes (Signed)
Pt intentionally pulled tele box. Pt stated that she is frustrated because her seroquel 400mg  is not working and she has not been able to sleep. Attempted to educate pt and put tele back on but pt refused saying she will not put tele back until her medication dose is changed. Informed CCMD and paged [facility]

## 2020-12-21 NOTE — Progress Notes (Signed)
PROGRESS NOTE    Ashley [name]  INO:[id number] DOB: 27-May-1961 DOA: 12/18/2020 PCP: Elsie Stain, MD    Chief Complaint  Patient presents with  . Weakness    Brief Narrative:  Ashley [name] is a 60 y.o. female with medical history significant for insulin-dependent type 2 diabetes, hyperlipidemia, bipolar disorder, tobacco use, and recently diagnosed stroke who initially presented to the ED morning of 12/18/2020 for evaluation of progressive weakness.  Found to have severe C3-4 cervical spinal stenosis with cord compression, hold DAPT, plan for OR for ACDF on 12/24/2020  Subjective:  No significant interval changes Reports mild neck pain when looking up, not able to move right arm, but able to ambulate She wants tele to be removed, she wants seroquel 800mg  bid, states that is what she takes at home  Assessment & Plan:   Principal Problem:   Cervical spinal cord compression (HCC) Active Problems:   Hyperlipidemia   History of stroke   Bipolar affective disorder in remission ([place])   Tobacco user   Type 2 diabetes mellitus with diabetic autonomic (poly)neuropathy ([place])   Malnutrition of moderate degree  Sever C3-4 cervical spinal stenosis with cord compression -Hold aspirin/Plavix -Neurosurgery following, plan for ACDF on 12/24/2020 -fall precautions  Posterior left parietal lobe ischemic stroke Small cortical left parietal ischemic stroke seen on MRI brain 11/24/2020.   Aspirin/Plavix on hold as above Continue statin  Insulin-dependent type 2 diabetes, uncontrolled, A1c 9.5% in 11/2020 Continue home Levemir 40 units, add sensitive SSI  Bipolar disorder Continue Seroquel, max dose is 800mg  per day, will not increase to 800mg  bid  she is pleasant and cooperative  UDS + THC  Tobacco use: Smoking 0.5-1 PPD.  Patient advised on smoking cessation.  She declines nicotine patch  Nutritional Assessment: The patient's BMI is: Body mass index is 22.48 kg/m.Marland Kitchen Seen by  dietician.  I agree with the assessment and plan as outlined below: Nutrition Status: Nutrition Problem: Moderate Malnutrition Etiology: social / environmental circumstances (inadequate intake due to unsanitary living conditions) Signs/Symptoms: percent weight loss,moderate muscle depletion,mild fat depletion,mild muscle depletion,moderate fat depletion Percent weight loss: 15 % Interventions: Ensure Enlive (each supplement provides 350kcal and 20 grams of protein),MVI  .   Unresulted Labs (From admission, onward)         None        DVT prophylaxis: enoxaparin (LOVENOX) injection 40 mg Start: 12/20/20 1700   Code Status:full Family Communication: Patient Disposition:   Status is: Inpatient  Dispo: The patient is from: Home              Anticipated d/c is to: To be determined              Anticipated d/c date is: > 3 days                Consultants:   Neurosurgery  Procedures:   None  Antimicrobials:   None  Anti-infectives (From admission, onward)   None           Objective: Vitals:   12/20/20 0752 12/20/20 1700 12/20/20 2000 12/21/20 0500  BP: (!) 138/92 (!) 142/131 (!) 150/95 99/63  Pulse: 87 87 82 77  Resp: 15 15 14 15   Temp: 98.4 F (36.9 C) 97.9 F (36.6 C) 98.1 F (36.7 C) 98.2 F (36.8 C)  TempSrc: Oral Oral Oral Oral  SpO2: 100% 100% 100% 99%  Weight:      Height:        Intake/Output  Summary (Last 24 hours) at 12/21/2020 1216 Last data filed at 12/21/2020 0517 Gross per 24 hour  Intake 966 ml  Output -  Net 966 ml   Filed Weights   12/20/20 0100  Weight: 65.1 kg    Examination:  General exam: calm, NAD Respiratory system: Clear to auscultation. Respiratory effort normal. Cardiovascular system: S1 & S2 heard, RRR. No JVD, no murmur, No pedal edema. Gastrointestinal system: Abdomen is nondistended, soft and nontender.  Normal bowel sounds heard. Central nervous system: Alert and orientedx3, no able to move right  amr Extremities: not able to move right arm Skin: No rashes, lesions or ulcers Psychiatry: Judgement and insight appear normal. Mood & affect appropriate.     Data Reviewed: I have personally reviewed following labs and imaging studies  CBC: Recent Labs  Lab 12/19/20 1508 12/20/20 0233  WBC 6.4 7.6  NEUTROABS 2.7  --   HGB 10.8* 11.6*  HCT 32.9* 34.7*  MCV 93.7 92.5  PLT 365 272    Basic Metabolic Panel: Recent Labs  Lab 12/19/20 1508 12/20/20 0233  NA 139 139  K 3.7 4.1  CL 105 108  CO2 26 20*  GLUCOSE 171* 88  BUN 15 18  CREATININE 0.87 0.95  CALCIUM 9.2 9.2    GFR: Estimated Creatinine Clearance: 60.5 mL/min (by C-G formula based on SCr of 0.95 mg/dL).  Liver Function Tests: Recent Labs  Lab 12/19/20 1508  AST 19  ALT 17  ALKPHOS 90  BILITOT 0.7  PROT 6.1*  ALBUMIN 3.4*    CBG: Recent Labs  Lab 12/20/20 0951 12/20/20 1155 12/20/20 1705 12/20/20 1754 12/21/20 0823  GLUCAP 181* 113* 59* 131* 287*     Recent Results (from the past 240 hour(s))  Resp Panel by RT-PCR (Flu A&B, Covid) Nasopharyngeal Swab     Status: None   Collection Time: 12/19/20  6:38 PM   Specimen: Nasopharyngeal Swab; Nasopharyngeal(NP) swabs in vial transport medium  Result Value Ref Range Status   SARS Coronavirus 2 by RT PCR NEGATIVE NEGATIVE Final    Comment: (NOTE) SARS-CoV-2 target nucleic acids are NOT DETECTED.  The SARS-CoV-2 RNA is generally detectable in upper respiratory specimens during the acute phase of infection. The lowest concentration of SARS-CoV-2 viral copies this assay can detect is 138 copies/mL. A negative result does not preclude SARS-Cov-2 infection and should not be used as the sole basis for treatment or other patient management decisions. A negative result may occur with  improper specimen collection/handling, submission of specimen other than nasopharyngeal swab, presence of viral mutation(s) within the areas targeted by this assay, and  inadequate number of viral copies(<138 copies/mL). A negative result must be combined with clinical observations, patient history, and epidemiological information. The expected result is Negative.  Fact Sheet for Patients:  EntrepreneurPulse.com.au  Fact Sheet for Healthcare Providers:  IncredibleEmployment.be  This test is no t yet approved or cleared by the [place] FDA and  has been authorized for detection and/or diagnosis of SARS-CoV-2 by FDA under an Emergency Use Authorization (EUA). This EUA will remain  in effect (meaning this test can be used) for the duration of the COVID-19 declaration under Section 564(b)(1) of the Act, 21 U.S.C.section 360bbb-3(b)(1), unless the authorization is terminated  or revoked sooner.       Influenza A by PCR NEGATIVE NEGATIVE Final   Influenza B by PCR NEGATIVE NEGATIVE Final    Comment: (NOTE) The Xpert Xpress SARS-CoV-2/FLU/RSV plus assay is intended as an aid in the  diagnosis of influenza from Nasopharyngeal swab specimens and should not be used as a sole basis for treatment. Nasal washings and aspirates are unacceptable for Xpert Xpress SARS-CoV-2/FLU/RSV testing.  Fact Sheet for Patients: EntrepreneurPulse.com.au  Fact Sheet for Healthcare Providers: IncredibleEmployment.be  This test is not yet approved or cleared by the [place] FDA and has been authorized for detection and/or diagnosis of SARS-CoV-2 by FDA under an Emergency Use Authorization (EUA). This EUA will remain in effect (meaning this test can be used) for the duration of the COVID-19 declaration under Section 564(b)(1) of the Act, 21 U.S.C. section 360bbb-3(b)(1), unless the authorization is terminated or revoked.  Performed at [facility], [place] [address], [place], [place] [address]          Radiology Studies: CT Angio Head W or Wo Contrast  Result Date:  12/19/2020 CLINICAL DATA:  Initial evaluation for neuro deficit, right-sided weakness, slurred speech, recent stroke. EXAM: CT ANGIOGRAPHY HEAD AND NECK TECHNIQUE: Multidetector CT imaging of the head and neck was performed using the standard protocol during bolus administration of intravenous contrast. Multiplanar CT image reconstructions and MIPs were obtained to evaluate the vascular anatomy. Carotid stenosis measurements (when applicable) are obtained utilizing NASCET criteria, using the distal internal carotid diameter as the denominator. CONTRAST:  44mL OMNIPAQUE IOHEXOL 350 MG/ML SOLN COMPARISON:  Prior MRI from 11/23/2020. FINDINGS: CT HEAD FINDINGS Brain: Cerebral volume within normal limits for age. Remote lacunar infarct at the left basal ganglia again noted. Additional small remote left parietal infarct recently seen on prior MRI not well visualized by CT. No acute intracranial hemorrhage. No acute large vessel territory infarct. No mass lesion, midline shift or mass effect. Mild ex vacuo dilatation of the left lateral ventricle related to the chronic left basal ganglia infarct. No hydrocephalus. No extra-axial fluid collection. Mineralization about the bilateral basal ganglia noted. Vascular: No hyperdense vessel. Scattered vascular calcifications noted within the carotid siphons. Skull: Scalp soft tissues and calvarium within normal limits. Sinuses: Paranasal sinuses and mastoid air cells are clear. Orbits: Globes and orbital soft tissues within normal limits. Review of the MIP images confirms the above findings CTA NECK FINDINGS Aortic arch: Visualized aortic arch of normal caliber with normal branch pattern. Irregular filling defect within the distal ascending aorta of measuring 1.5 x 0.9 cm consistent with thrombus, suspected to reflect changes of a recently ruptured plaque (series 10, image 8). Finding could serve as an embolic source. No hemodynamically significant stenosis seen about the origin  the great vessels. Visualized subclavian arteries patent. Right carotid system: Right CCA patent from its origin to the bifurcation without stenosis. Mild eccentric plaque about the origin of the right ICA with no more than mild 25% stenosis by NASCET criteria. Right ICA patent distally without stenosis, dissection or occlusion. Left carotid system: Left CCA patent from its origin to the bifurcation without stenosis. Mild atheromatous plaque about the origin of the left ICA without significant stenosis. Left ICA patent distally without stenosis, dissection or occlusion. Vertebral arteries: Both vertebral arteries arise from the subclavian arteries. Vertebral arteries patent without stenosis, dissection or occlusion. Skeleton: No visible acute osseous finding. No discrete or worrisome osseous lesions. Moderate multilevel cervical spondylosis. Other neck: No other acute soft tissue abnormality within the neck. No mass or adenopathy. Upper chest: Visualized upper chest demonstrates no other acute finding. Review of the MIP images confirms the above findings CTA HEAD FINDINGS Anterior circulation: Petrous segments patent bilaterally. Scattered atheromatous change within the carotid siphons with  associated mild to moderate multifocal narrowing. A1 segments patent bilaterally. Normal anterior communicating artery complex. Anterior cerebral arteries patent to their distal aspects without stenosis. No M1 stenosis or occlusion. Normal MCA bifurcations. Distal MCA branches well perfused and symmetric. Posterior circulation: Both V4 segments patent to the vertebrobasilar junction without significant stenosis. Left PICA origin patent and normal. Right PICA not definitely visualized. Basilar patent to its distal aspect without stenosis. Superior cerebellar arteries patent bilaterally. Both PCAs primarily supplied via the basilar well perfused to their distal aspects. Venous sinuses: Patent. Anatomic variants: None significant.   No aneurysm. Review of the MIP images confirms the above findings IMPRESSION: CT HEAD IMPRESSION: 1. No acute intracranial abnormality. 2. Chronic left basal ganglia lacunar infarct. CTA HEAD AND NECK IMPRESSION: 1. Negative CTA for large vessel occlusion. 2. 1.5 x 0.9 cm irregular filling defect within the ascending aorta, consistent with thrombus, suspected to reflect changes of a recently ruptured plaque. Finding could serve as an embolic source. 3. Atheromatous change about the carotid bifurcations and carotid siphons without high-grade or hemodynamically significant stenosis. 4. Otherwise wide patency of the major arterial vasculature of the head and neck. No large vessel occlusion. No other hemodynamically significant or correctable stenosis. Electronically Signed   By: Jeannine Boga M.D.   On: 12/19/2020 21:56   CT Angio Neck W and/or Wo Contrast  Result Date: 12/19/2020 CLINICAL DATA:  Initial evaluation for neuro deficit, right-sided weakness, slurred speech, recent stroke. EXAM: CT ANGIOGRAPHY HEAD AND NECK TECHNIQUE: Multidetector CT imaging of the head and neck was performed using the standard protocol during bolus administration of intravenous contrast. Multiplanar CT image reconstructions and MIPs were obtained to evaluate the vascular anatomy. Carotid stenosis measurements (when applicable) are obtained utilizing NASCET criteria, using the distal internal carotid diameter as the denominator. CONTRAST:  7mL OMNIPAQUE IOHEXOL 350 MG/ML SOLN COMPARISON:  Prior MRI from 11/23/2020. FINDINGS: CT HEAD FINDINGS Brain: Cerebral volume within normal limits for age. Remote lacunar infarct at the left basal ganglia again noted. Additional small remote left parietal infarct recently seen on prior MRI not well visualized by CT. No acute intracranial hemorrhage. No acute large vessel territory infarct. No mass lesion, midline shift or mass effect. Mild ex vacuo dilatation of the left lateral ventricle  related to the chronic left basal ganglia infarct. No hydrocephalus. No extra-axial fluid collection. Mineralization about the bilateral basal ganglia noted. Vascular: No hyperdense vessel. Scattered vascular calcifications noted within the carotid siphons. Skull: Scalp soft tissues and calvarium within normal limits. Sinuses: Paranasal sinuses and mastoid air cells are clear. Orbits: Globes and orbital soft tissues within normal limits. Review of the MIP images confirms the above findings CTA NECK FINDINGS Aortic arch: Visualized aortic arch of normal caliber with normal branch pattern. Irregular filling defect within the distal ascending aorta of measuring 1.5 x 0.9 cm consistent with thrombus, suspected to reflect changes of a recently ruptured plaque (series 10, image 8). Finding could serve as an embolic source. No hemodynamically significant stenosis seen about the origin the great vessels. Visualized subclavian arteries patent. Right carotid system: Right CCA patent from its origin to the bifurcation without stenosis. Mild eccentric plaque about the origin of the right ICA with no more than mild 25% stenosis by NASCET criteria. Right ICA patent distally without stenosis, dissection or occlusion. Left carotid system: Left CCA patent from its origin to the bifurcation without stenosis. Mild atheromatous plaque about the origin of the left ICA without significant stenosis. Left ICA patent distally  without stenosis, dissection or occlusion. Vertebral arteries: Both vertebral arteries arise from the subclavian arteries. Vertebral arteries patent without stenosis, dissection or occlusion. Skeleton: No visible acute osseous finding. No discrete or worrisome osseous lesions. Moderate multilevel cervical spondylosis. Other neck: No other acute soft tissue abnormality within the neck. No mass or adenopathy. Upper chest: Visualized upper chest demonstrates no other acute finding. Review of the MIP images confirms the  above findings CTA HEAD FINDINGS Anterior circulation: Petrous segments patent bilaterally. Scattered atheromatous change within the carotid siphons with associated mild to moderate multifocal narrowing. A1 segments patent bilaterally. Normal anterior communicating artery complex. Anterior cerebral arteries patent to their distal aspects without stenosis. No M1 stenosis or occlusion. Normal MCA bifurcations. Distal MCA branches well perfused and symmetric. Posterior circulation: Both V4 segments patent to the vertebrobasilar junction without significant stenosis. Left PICA origin patent and normal. Right PICA not definitely visualized. Basilar patent to its distal aspect without stenosis. Superior cerebellar arteries patent bilaterally. Both PCAs primarily supplied via the basilar well perfused to their distal aspects. Venous sinuses: Patent. Anatomic variants: None significant.  No aneurysm. Review of the MIP images confirms the above findings IMPRESSION: CT HEAD IMPRESSION: 1. No acute intracranial abnormality. 2. Chronic left basal ganglia lacunar infarct. CTA HEAD AND NECK IMPRESSION: 1. Negative CTA for large vessel occlusion. 2. 1.5 x 0.9 cm irregular filling defect within the ascending aorta, consistent with thrombus, suspected to reflect changes of a recently ruptured plaque. Finding could serve as an embolic source. 3. Atheromatous change about the carotid bifurcations and carotid siphons without high-grade or hemodynamically significant stenosis. 4. Otherwise wide patency of the major arterial vasculature of the head and neck. No large vessel occlusion. No other hemodynamically significant or correctable stenosis. Electronically Signed   By: Jeannine Boga M.D.   On: 12/19/2020 21:56   MR Cervical Spine Wo Contrast  Result Date: 12/19/2020 CLINICAL DATA:  Myelopathy. History of stroke approximately 1 month ago. New unexplained left-sided numbness and weakness. EXAM: MRI CERVICAL SPINE WITHOUT  CONTRAST TECHNIQUE: Multiplanar, multisequence MR imaging of the cervical spine was performed. No intravenous contrast was administered. COMPARISON:  None. FINDINGS: Alignment: Cervical spine straightening. Trace anterolisthesis of C4 on C5. Vertebrae: No fracture, suspicious osseous lesion, or significant marrow edema. Cord: Abnormal T2 hyperintensity in the spinal cord at C3 associated with a large disc extrusion. Posterior Fossa, vertebral arteries, paraspinal tissues: Asymmetric fatty atrophy of the right parotid gland. Preserved vertebral artery flow voids. No cerebellar tonsillar ectopia. Disc levels: C2-3: Minimal disc bulging, infolding of the ligamentum flavum, and right uncovertebral spurring result in mild spinal stenosis and mild right neural foraminal stenosis. C3-4: A large right paracentral disc extrusion with mild superior migration results in severe spinal stenosis and cord compression. Patent neural foramina. C4-5: Disc bulging, uncovertebral spurring, and mild facet arthrosis result in moderate spinal stenosis with mild cord flattening and mild left neural foraminal stenosis. C5-6: Disc bulging and uncovertebral spurring result in mild spinal stenosis and moderate right and mild left neural foraminal stenosis. C6-7: Mild disc bulging and spurring without significant stenosis. C7-T1: Mild left facet arthrosis without stenosis. T1-2: A right foraminal disc protrusion and endplate spurring result in severe right neural foraminal stenosis. No spinal stenosis. IMPRESSION: 1. 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 2. Moderate spinal stenosis at C4-5 and mild spinal stenosis at C2-3 and C5-6. 3. Severe right neural foraminal stenosis at T1-2.  Electronically Signed   By: Logan Bores M.D.   On: 12/19/2020 18:17        Scheduled Meds: . enoxaparin (LOVENOX) injection  40 mg  Subcutaneous Q24H  . feeding supplement  237 mL Oral BID BM  . gabapentin  600 mg Oral TID  . glipiZIDE  20 mg Oral Daily  . insulin aspart  0-9 Units Subcutaneous TID WC  . insulin detemir  40 Units Subcutaneous Daily  . multivitamin with minerals  1 tablet Oral Daily  . QUEtiapine  400 mg Oral BID  . rosuvastatin  40 mg Oral Daily  . sodium chloride flush  3 mL Intravenous Q12H   Continuous Infusions:   LOS: 2 days   Time spent: 41mins Greater than 50% of this time was spent in counseling, explanation of diagnosis, planning of further management, and coordination of care.   Voice Recognition Viviann Spare dictation system was used to create this note, attempts have been made to correct errors. Please contact the author with questions and/or clarifications.   Florencia Reasons, MD PhD FACP [facility]  Available via Epic secure chat 7am-7pm for nonurgent issues Please page for urgent issues To page the attending provider between 7A-7P or the covering provider during after hours 7P-7A, please log into the web site [email or web address] and access using universal [place] password for that web site. If you do not have the password, please call the hospital operator.    12/21/2020, 12:16 PM

## 2020-12-21 NOTE — Progress Notes (Signed)
[facility] notifed that patient c/o 8/10 right hand pain and that Tylenol 650 mg was not effective. Arthor Captain LPN

## 2020-12-21 NOTE — Progress Notes (Signed)
Neurosurgery Service Progress Note  Subjective: No acute events overnight, no new complaints this morning   Objective: Vitals:   12/20/20 0752 12/20/20 1700 12/20/20 2000 12/21/20 0500  BP: (!) 138/92 (!) 142/131 (!) 150/95 99/63  Pulse: 87 87 82 77  Resp: 15 15 14 15   Temp: 98.4 F (36.9 C) 97.9 F (36.6 C) 98.1 F (36.7 C) 98.2 F (36.8 C)  TempSrc: Oral Oral Oral Oral  SpO2: 100% 100% 100% 99%  Weight:      Height:        Physical Exam: Strength 4-/5 in RUE / RLE, 4/5 in LUE / LLE, +hoffman's on L  Assessment & Plan: 60 y.o. woman w/ recent stroke and large cervical disc herniation with myelopathy.   -OR availability limited, rescheduled for 3/22, single level ACDF, continue to hold DAPT  Judith Part  12/21/20 9:27 AM

## 2020-12-22 LAB — GLUCOSE, CAPILLARY
Glucose-Capillary: 173 mg/dL — ABNORMAL HIGH (ref 70–99)
Glucose-Capillary: 135 mg/dL — ABNORMAL HIGH (ref 70–99)
Glucose-Capillary: 175 mg/dL — ABNORMAL HIGH (ref 70–99)
Glucose-Capillary: 225 mg/dL — ABNORMAL HIGH (ref 70–99)

## 2020-12-22 MED ORDER — TRAMADOL HCL 50 MG PO TABS
50.0000 mg | ORAL_TABLET | Freq: Four times a day (QID) | ORAL | Status: DC | PRN
  Administered 2020-12-22 – 2020-12-24 (×7): 50 mg via ORAL
  Filled 2020-12-22 (×7): qty 1

## 2020-12-22 MED ORDER — SENNOSIDES-DOCUSATE SODIUM 8.6-50 MG PO TABS
1.0000 | ORAL_TABLET | Freq: Two times a day (BID) | ORAL | Status: DC
  Administered 2020-12-22 – 2020-12-28 (×9): 1 via ORAL
  Filled 2020-12-22 (×12): qty 1

## 2020-12-22 NOTE — Progress Notes (Signed)
PROGRESS NOTE    Ashley [name]  IEP:[id number] DOB: [birth year] DOA: 12/18/2020 PCP: Elsie Stain, MD    Chief Complaint  Patient presents with  . Weakness    Brief Narrative:  Ashley [name] is a 60 y.o. female with medical history significant for insulin-dependent type 2 diabetes, hyperlipidemia, bipolar disorder, tobacco use, and recently diagnosed stroke who initially presented to the ED morning of 12/18/2020 for evaluation of progressive weakness.  Found to have severe C3-4 cervical spinal stenosis with cord compression, hold DAPT, plan for OR for ACDF on 12/24/2020  Subjective:  No significant interval changes Reports mild neck pain when looking up, not able to move right arm,  but able to ambulate, denies leg weakness,  No bm for a week, report she has a bm once a week for the last few months  Assessment & Plan:   Principal Problem:   Cervical spinal cord compression (HCC) Active Problems:   Hyperlipidemia   History of stroke   Bipolar affective disorder in remission ([place])   Tobacco user   Type 2 diabetes mellitus with diabetic autonomic (poly)neuropathy ([place])   Malnutrition of moderate degree  Sever C3-4 cervical spinal stenosis with cord compression -Hold aspirin/Plavix -Neurosurgery following, plan for ACDF on 12/25/2020 -fall precautions  Posterior left parietal lobe ischemic stroke Small cortical left parietal ischemic stroke seen on MRI brain 11/24/2020.   Aspirin/Plavix on hold as above Continue statin  Insulin-dependent type 2 diabetes, uncontrolled, A1c 9.5% in 11/2020 Continue home Levemir 40 units, add sensitive SSI  Bipolar disorder Continue Seroquel, max dose is 800mg  per day, will not increase to 800mg  bid  she is pleasant and cooperative  UDS + THC  Tobacco use: Smoking 0.5-1 PPD.  Patient advised on smoking cessation.  She declines nicotine patch  Constipation Start stool softener  Nutritional Assessment: The patient's BMI is:  Body mass index is 22.48 kg/m.Marland Kitchen Seen by dietician.  I agree with the assessment and plan as outlined below: Nutrition Status: Nutrition Problem: Moderate Malnutrition Etiology: social / environmental circumstances (inadequate intake due to unsanitary living conditions) Signs/Symptoms: percent weight loss,moderate muscle depletion,mild fat depletion,mild muscle depletion,moderate fat depletion Percent weight loss: 15 % Interventions: Ensure Enlive (each supplement provides 350kcal and 20 grams of protein),MVI  .   Unresulted Labs (From admission, onward)         None        DVT prophylaxis: enoxaparin (LOVENOX) injection 40 mg Start: 12/20/20 1700   Code Status:full Family Communication: Patient Disposition:   Status is: Inpatient  Dispo: The patient is from: Home              Anticipated d/c is to: To be determined              Anticipated d/c date is: > 3 days                Consultants:   Neurosurgery  Procedures:   None  Antimicrobials:   None  Anti-infectives (From admission, onward)   None           Objective: Vitals:   12/21/20 1317 12/21/20 2007 12/22/20 0337 12/22/20 0840  BP: 126/77 (!) 145/86 (!) 115/95 102/69  Pulse: 85 100 87 75  Resp: 16 16 16 16   Temp: 98 F (36.7 C) 98.3 F (36.8 C) 98.2 F (36.8 C) 98.6 F (37 C)  TempSrc:   Oral Oral  SpO2: 98% 100% 100% 100%  Weight:      Height:  Intake/Output Summary (Last 24 hours) at 12/22/2020 1227 Last data filed at 12/21/2020 1700 Gross per 24 hour  Intake 440 ml  Output --  Net 440 ml   Filed Weights   12/20/20 0100  Weight: 65.1 kg    Examination:  General exam: calm, NAD Respiratory system: Clear to auscultation. Respiratory effort normal. Cardiovascular system: S1 & S2 heard, RRR. No JVD, no murmur, No pedal edema. Gastrointestinal system: Abdomen is nondistended, soft and nontender.  Normal bowel sounds heard. Central nervous system: Alert and orientedx3, no  able to move right amr Extremities: not able to move right arm Skin: No rashes, lesions or ulcers Psychiatry: Judgement and insight appear normal. Mood & affect appropriate.     Data Reviewed: I have personally reviewed following labs and imaging studies  CBC: Recent Labs  Lab 12/19/20 1508 12/20/20 0233  WBC 6.4 7.6  NEUTROABS 2.7  --   HGB 10.8* 11.6*  HCT 32.9* 34.7*  MCV 93.7 92.5  PLT 365 673    Basic Metabolic Panel: Recent Labs  Lab 12/19/20 1508 12/20/20 0233  NA 139 139  K 3.7 4.1  CL 105 108  CO2 26 20*  GLUCOSE 171* 88  BUN 15 18  CREATININE 0.87 0.95  CALCIUM 9.2 9.2    GFR: Estimated Creatinine Clearance: 60.5 mL/min (by C-G formula based on SCr of 0.95 mg/dL).  Liver Function Tests: Recent Labs  Lab 12/19/20 1508  AST 19  ALT 17  ALKPHOS 90  BILITOT 0.7  PROT 6.1*  ALBUMIN 3.4*    CBG: Recent Labs  Lab 12/21/20 1307 12/21/20 1649 12/21/20 2007 12/22/20 0837 12/22/20 1214  GLUCAP 106* 157* 104* 173* 135*     Recent Results (from the past 240 hour(s))  Resp Panel by RT-PCR (Flu A&B, Covid) Nasopharyngeal Swab     Status: None   Collection Time: 12/19/20  6:38 PM   Specimen: Nasopharyngeal Swab; Nasopharyngeal(NP) swabs in vial transport medium  Result Value Ref Range Status   SARS Coronavirus 2 by RT PCR NEGATIVE NEGATIVE Final    Comment: (NOTE) SARS-CoV-2 target nucleic acids are NOT DETECTED.  The SARS-CoV-2 RNA is generally detectable in upper respiratory specimens during the acute phase of infection. The lowest concentration of SARS-CoV-2 viral copies this assay can detect is 138 copies/mL. A negative result does not preclude SARS-Cov-2 infection and should not be used as the sole basis for treatment or other patient management decisions. A negative result may occur with  improper specimen collection/handling, submission of specimen other than nasopharyngeal swab, presence of viral mutation(s) within the areas targeted  by this assay, and inadequate number of viral copies(<138 copies/mL). A negative result must be combined with clinical observations, patient history, and epidemiological information. The expected result is Negative.  Fact Sheet for Patients:  EntrepreneurPulse.com.au  Fact Sheet for Healthcare Providers:  IncredibleEmployment.be  This test is no t yet approved or cleared by the [place] FDA and  has been authorized for detection and/or diagnosis of SARS-CoV-2 by FDA under an Emergency Use Authorization (EUA). This EUA will remain  in effect (meaning this test can be used) for the duration of the COVID-19 declaration under Section 564(b)(1) of the Act, 21 U.S.C.section 360bbb-3(b)(1), unless the authorization is terminated  or revoked sooner.       Influenza A by PCR NEGATIVE NEGATIVE Final   Influenza B by PCR NEGATIVE NEGATIVE Final    Comment: (NOTE) The Xpert Xpress SARS-CoV-2/FLU/RSV plus assay is intended as an aid in  the diagnosis of influenza from Nasopharyngeal swab specimens and should not be used as a sole basis for treatment. Nasal washings and aspirates are unacceptable for Xpert Xpress SARS-CoV-2/FLU/RSV testing.  Fact Sheet for Patients: EntrepreneurPulse.com.au  Fact Sheet for Healthcare Providers: IncredibleEmployment.be  This test is not yet approved or cleared by the [place] FDA and has been authorized for detection and/or diagnosis of SARS-CoV-2 by FDA under an Emergency Use Authorization (EUA). This EUA will remain in effect (meaning this test can be used) for the duration of the COVID-19 declaration under Section 564(b)(1) of the Act, 21 U.S.C. section 360bbb-3(b)(1), unless the authorization is terminated or revoked.  Performed at [facility], [place] [address], [place], [place] [address]          Radiology Studies: ECHOCARDIOGRAM COMPLETE  Result Date:  12/21/2020    ECHOCARDIOGRAM REPORT   Patient Name:   KENZEY BIRKLAND Date of Exam: 12/21/2020 Medical Rec #:  [id number]        Height:       67.0 in Accession #:    [id number]       Weight:       143.5 lb Date of Birth:  October 17, 1960         BSA:          1.756 m Patient Age:    73 years         BP:           99/63 mmHg Patient Gender: F                HR:           83 bpm. Exam Location:  Inpatient Procedure: 2D Echo, 3D Echo, Cardiac Doppler, Color Doppler and Strain Analysis Indications:    CVA  History:        Patient has prior history of Echocardiogram examinations, most                 recent 05/18/2017. Signs/Symptoms:Murmur; Risk                 Factors:Hypertension and Diabetes.  Sonographer:    Luisa Hart RDCS Referring Phys: [id number] [place]  1. Left ventricular ejection fraction by 3D volume is 57 %. The left ventricle has normal function. The left ventricle has no regional wall motion abnormalities. There is mild left ventricular hypertrophy. Left ventricular diastolic parameters are consistent with Grade I diastolic dysfunction (impaired relaxation).  2. Right ventricular systolic function is normal. The right ventricular size is normal. There is normal pulmonary artery systolic pressure.  3. The mitral valve is normal in structure. Trivial mitral valve regurgitation. No evidence of mitral stenosis.  4. The aortic valve is grossly normal. Aortic valve regurgitation is trivial. No aortic stenosis is present.  5. The inferior vena cava is normal in size with greater than 50% respiratory variability, suggesting right atrial pressure of 3 mmHg. Conclusion(s)/Recommendation(s): No intracardiac source of embolism detected on this transthoracic study. A transesophageal echocardiogram is recommended to exclude cardiac source of embolism if clinically indicated. FINDINGS  Left Ventricle: Left ventricular ejection fraction by 3D volume is 57 %. The left ventricle has normal function. The left  ventricle has no regional wall motion abnormalities. Global longitudinal strain performed but not reported based on interpreter judgement due to suboptimal tracking. The left ventricular internal cavity size was normal in size. There is mild left ventricular hypertrophy. Left ventricular diastolic parameters are consistent with Grade I  diastolic dysfunction (impaired relaxation). Right Ventricle: The right ventricular size is normal. No increase in right ventricular wall thickness. Right ventricular systolic function is normal. There is normal pulmonary artery systolic pressure. The tricuspid regurgitant velocity is 1.87 m/s, and  with an assumed right atrial pressure of 3 mmHg, the estimated right ventricular systolic pressure is 16.1 mmHg. Left Atrium: Left atrial size was normal in size. Right Atrium: Right atrial size was normal in size. Pericardium: There is no evidence of pericardial effusion. Mitral Valve: The mitral valve is normal in structure. Trivial mitral valve regurgitation. No evidence of mitral valve stenosis. Tricuspid Valve: The tricuspid valve is normal in structure. Tricuspid valve regurgitation is trivial. No evidence of tricuspid stenosis. Aortic Valve: The aortic valve is grossly normal. Aortic valve regurgitation is trivial. No aortic stenosis is present. Aortic valve mean gradient measures 5.0 mmHg. Aortic valve peak gradient measures 10.0 mmHg. Aortic valve area, by VTI measures 2.43 cm. Pulmonic Valve: The pulmonic valve was grossly normal. Pulmonic valve regurgitation is trivial. No evidence of pulmonic stenosis. Aorta: The aortic root is normal in size and structure. Venous: The inferior vena cava is normal in size with greater than 50% respiratory variability, suggesting right atrial pressure of 3 mmHg. IAS/Shunts: No atrial level shunt detected by color flow Doppler.  LEFT VENTRICLE PLAX 2D LVIDd:         3.20 cm         Diastology LVIDs:         2.80 cm         LV e' medial:    6.96  cm/s LV PW:         1.20 cm         LV E/e' medial:  12.7 LV IVS:        1.30 cm         LV e' lateral:   4.74 cm/s LVOT diam:     2.00 cm         LV E/e' lateral: 18.6 LV SV:         69 LV SV Index:   39 LVOT Area:     3.14 cm        3D Volume EF                                LV 3D EF:    Left                                             ventricular                                             ejection                                             fraction by  3D volume                                             is 57 %.                                 3D Volume EF:                                3D EF:        57 %  PULMONARY VEINS A Reversal Duration: 132.00 msec A Reversal Velocity: 08.65 cm/s Diastolic Velocity:  78.46 cm/s S/D Velocity:        9.62 Systolic Velocity:   95.28 cm/s LEFT ATRIUM             Index       RIGHT ATRIUM           Index LA diam:        2.20 cm 1.25 cm/m  RA Area:     10.60 cm LA Vol (A2C):   15.0 ml 8.54 ml/m  RA Volume:   20.30 ml  11.56 ml/m LA Vol (A4C):   39.5 ml 22.49 ml/m LA Biplane Vol: 26.4 ml 15.03 ml/m  AORTIC VALVE                    PULMONIC VALVE AV Area (Vmax):    2.13 cm     PV Vmax:       1.01 m/s AV Area (Vmean):   2.04 cm     PV Vmean:      71.700 cm/s AV Area (VTI):     2.43 cm     PV VTI:        0.227 m AV Vmax:           158.00 cm/s  PV Peak grad:  4.1 mmHg AV Vmean:          108.000 cm/s PV Mean grad:  2.0 mmHg AV VTI:            0.285 m AV Peak Grad:      10.0 mmHg AV Mean Grad:      5.0 mmHg LVOT Vmax:         107.00 cm/s LVOT Vmean:        70.000 cm/s LVOT VTI:          0.220 m LVOT/AV VTI ratio: 0.77  AORTA Ao Root diam: 2.70 cm Ao Asc diam:  2.80 cm MITRAL VALVE                TRICUSPID VALVE MV Area (PHT): 5.42 cm     TR Peak grad:   14.0 mmHg MV Decel Time: 140 msec     TR Vmax:        187.00 cm/s MV E velocity: 88.30 cm/s MV A velocity: 115.00 cm/s  SHUNTS MV E/A ratio:  0.77         Systemic VTI:  0.22 m                              Systemic Diam: 2.00 cm Cherlynn Kaiser MD Electronically signed by Cherlynn Kaiser MD Signature Date/Time: 12/21/2020/2:12:16 PM  Final         Scheduled Meds: . enoxaparin (LOVENOX) injection  40 mg Subcutaneous Q24H  . feeding supplement  237 mL Oral BID BM  . gabapentin  600 mg Oral TID  . glipiZIDE  20 mg Oral Daily  . insulin aspart  0-9 Units Subcutaneous TID WC  . insulin detemir  40 Units Subcutaneous Daily  . multivitamin with minerals  1 tablet Oral Daily  . QUEtiapine  400 mg Oral BID  . rosuvastatin  40 mg Oral Daily  . senna-docusate  1 tablet Oral BID  . sodium chloride flush  3 mL Intravenous Q12H   Continuous Infusions:   LOS: 3 days   Time spent: 9mins Greater than 50% of this time was spent in counseling, explanation of diagnosis, planning of further management, and coordination of care.   Voice Recognition Viviann Spare dictation system was used to create this note, attempts have been made to correct errors. Please contact the author with questions and/or clarifications.   Florencia Reasons, MD PhD FACP [facility]  Available via Epic secure chat 7am-7pm for nonurgent issues Please page for urgent issues To page the attending provider between 7A-7P or the covering provider during after hours 7P-7A, please log into the web site [email or web address] and access using universal [place] password for that web site. If you do not have the password, please call the hospital operator.    12/22/2020, 12:27 PM

## 2020-12-22 NOTE — Progress Notes (Signed)
Bed alarm went off myself and the tech arrived to room for patient already flipping over the side rail to get to bedside commode. She didn't use the call bell her excuse was we take to long and when I have to pee I got to go and can't wait. In formed the Charge RN about the patient being non compliant about using the call bell. Arthor Captain LPN

## 2020-12-22 NOTE — Progress Notes (Signed)
Pt continues to ignore safety instructions. Pt witnessed to put her own side rail down and attempt to get OOB without utilizing her callbell as instructed. Pt states "I've been using the callbell and no one comes. And I did not fall last night, it's upsetting me that y'all saying I fell when my legs just gave out and I just sat down on the floor." Informed pt it sounded as if pt had a fall without injury and efforts to avoid another incident would be followed today. Bed alarm on at all times and instructed several times to pt to no put side rail down, to use callbell when she has to use the [facility] and to not elevate the bed height off the floor.

## 2020-12-22 NOTE — Plan of Care (Signed)

## 2020-12-22 NOTE — Progress Notes (Signed)
   Providing Compassionate, Quality Care - Together  NEUROSURGERY PROGRESS NOTE   S: No issues overnight.   O: EXAM:  BP (!) 115/95 (BP Location: Right Arm)   Pulse 87   Temp 98.2 F (36.8 C) (Oral)   Resp 16   Ht 5\' 7"  (1.702 m)   Wt 65.1 kg   SpO2 100%   BMI 22.48 kg/m   Awake, alert, oriented  Speech fluent, appropriate  CNs grossly intact  4/5 BUE/BLE  PERRL  ASSESSMENT:  60 y.o. female with  Cervical myelopathy due to large disc herniation  PLAN: -ACDF scheduled for 3/22, continue to hold DAPT    Thank you for allowing me to participate in this patient's care.  Please do not hesitate to call with questions or concerns.   Elwin Sleight, [facility] Cell: [phone]

## 2020-12-22 NOTE — Progress Notes (Signed)
Patient had non witness fall she was found sitting on the floor beside the bed. When asked what she was doing she stated she wanted to get blanket and her legs gave away and she sat on the floor. Myself and NT had just clean up from a incontinent episode and I gave her a snack. She insist that she didn't fall and became upset when I explained that the bed alarm will be activated. She again insisted that she didn't fall but sat down I explained that anytime patient is on the floor the bed alarm is activated. [facility] on call was notified. Arthor Captain LPN

## 2020-12-22 NOTE — Plan of Care (Signed)
  Problem: Education: Goal: Knowledge of General Education information will improve Description: Including pain rating scale, medication(s)/side effects and non-pharmacologic comfort measures Outcome: Progressing   Problem: Health Behavior/Discharge Planning: Goal: Ability to manage health-related needs will improve Outcome: Progressing   Problem: Clinical Measurements: Goal: Will remain free from infection Outcome: Progressing   

## 2020-12-23 LAB — GLUCOSE, CAPILLARY
Glucose-Capillary: 239 mg/dL — ABNORMAL HIGH (ref 70–99)
Glucose-Capillary: 169 mg/dL — ABNORMAL HIGH (ref 70–99)
Glucose-Capillary: 168 mg/dL — ABNORMAL HIGH (ref 70–99)
Glucose-Capillary: 170 mg/dL — ABNORMAL HIGH (ref 70–99)

## 2020-12-23 NOTE — Plan of Care (Signed)
  Problem: Clinical Measurements: Goal: Will remain free from infection Outcome: Progressing Goal: Diagnostic test results will improve Outcome: Progressing   Problem: Activity: Goal: Risk for activity intolerance will decrease Outcome: Progressing   

## 2020-12-23 NOTE — Progress Notes (Signed)
PROGRESS NOTE    Ashley [name]  PZW:[id number] DOB: Nov 03, 1960 DOA: 12/18/2020 PCP: Elsie Stain, MD    Chief Complaint  Patient presents with  . Weakness    Brief Narrative:  Ashley [name] is a 60 y.o. female with medical history significant for insulin-dependent type 2 diabetes, hyperlipidemia, bipolar disorder, tobacco use, and recently diagnosed stroke who initially presented to the ED morning of 12/18/2020 for evaluation of progressive weakness.  Found to have severe C3-4 cervical spinal stenosis with cord compression, hold DAPT, plan for OR for ACDF on 12/24/2020  Subjective:  No significant interval changes Reports mild neck pain when looking up,  right arm weakness , over all no significant change She is  able to ambulate, denies leg weakness,  Had bm yesterday  Assessment & Plan:   Principal Problem:   Cervical spinal cord compression (HCC) Active Problems:   Hyperlipidemia   History of stroke   Bipolar affective disorder in remission ([place])   Tobacco user   Type 2 diabetes mellitus with diabetic autonomic (poly)neuropathy ([place])   Malnutrition of moderate degree  Sever C3-4 cervical spinal stenosis with cord compression -Hold aspirin/Plavix -Neurosurgery following, plan for ACDF on 12/25/2020 -fall precautions  Posterior left parietal lobe ischemic stroke Small cortical left parietal ischemic stroke seen on MRI brain 11/24/2020.   Aspirin/Plavix on hold as above Continue statin  Insulin-dependent type 2 diabetes, uncontrolled, A1c 9.5% in 11/2020 Continue home Levemir 40 units, add sensitive SSI  Bipolar disorder Continue Seroquel, max dose is 800mg  per day, will not increase to 800mg  bid  she is pleasant and cooperative  UDS + THC  Tobacco use: Smoking 0.5-1 PPD.  Patient advised on smoking cessation.  She declines nicotine patch  Constipation Start stool softener, had bm on 3/19  Nutritional Assessment: The patient's BMI is: Body mass index  is 22.48 kg/m.Marland Kitchen Seen by dietician.  I agree with the assessment and plan as outlined below: Nutrition Status: Nutrition Problem: Moderate Malnutrition Etiology: social / environmental circumstances (inadequate intake due to unsanitary living conditions) Signs/Symptoms: percent weight loss,moderate muscle depletion,mild fat depletion,mild muscle depletion,moderate fat depletion Percent weight loss: 15 % Interventions: Ensure Enlive (each supplement provides 350kcal and 20 grams of protein),MVI  .   Unresulted Labs (From admission, onward)          Start     Ordered   12/24/20 0500  CBC  Tomorrow morning,   R       Question:  Specimen collection method  Answer:  Lab=Lab collect   12/23/20 1620   12/24/20 4235  Basic metabolic panel  Tomorrow morning,   R       Question:  Specimen collection method  Answer:  Lab=Lab collect   12/23/20 1620   12/24/20 0500  Magnesium  Tomorrow morning,   R       Question:  Specimen collection method  Answer:  Lab=Lab collect   12/23/20 1620            DVT prophylaxis: enoxaparin (LOVENOX) injection 40 mg Start: 12/20/20 1700   Code Status:full Family Communication: Patient Disposition:   Status is: Inpatient  Dispo: The patient is from: Home              Anticipated d/c is to: To be determined              Anticipated d/c date is: > 3 days  Consultants:   Neurosurgery  Procedures:   None  Antimicrobials:   None  Anti-infectives (From admission, onward)   None           Objective: Vitals:   12/22/20 1702 12/22/20 2005 12/23/20 0158 12/23/20 0745  BP: 107/66 120/72 92/62 108/74  Pulse: 83 90 94 75  Resp: 18 19 16 17   Temp: 99 F (37.2 C) 98.5 F (36.9 C) 98.7 F (37.1 C) 98.3 F (36.8 C)  TempSrc: Oral Oral Oral Oral  SpO2: 99% 97% 100% 95%  Weight:      Height:        Intake/Output Summary (Last 24 hours) at 12/23/2020 1621 Last data filed at 12/23/2020 1500 Gross per 24 hour  Intake 480  ml  Output --  Net 480 ml   Filed Weights   12/20/20 0100  Weight: 65.1 kg    Examination:  General exam: calm, NAD Respiratory system: Clear to auscultation. Respiratory effort normal. Cardiovascular system: S1 & S2 heard, RRR. No JVD, no murmur, No pedal edema. Gastrointestinal system: Abdomen is nondistended, soft and nontender.  Normal bowel sounds heard. Central nervous system: Alert and orientedx3, no able to move right amr Extremities: not able to move right arm Skin: No rashes, lesions or ulcers Psychiatry: Judgement and insight appear normal. Mood & affect appropriate.     Data Reviewed: I have personally reviewed following labs and imaging studies  CBC: Recent Labs  Lab 12/19/20 1508 12/20/20 0233  WBC 6.4 7.6  NEUTROABS 2.7  --   HGB 10.8* 11.6*  HCT 32.9* 34.7*  MCV 93.7 92.5  PLT 365 517    Basic Metabolic Panel: Recent Labs  Lab 12/19/20 1508 12/20/20 0233  NA 139 139  K 3.7 4.1  CL 105 108  CO2 26 20*  GLUCOSE 171* 88  BUN 15 18  CREATININE 0.87 0.95  CALCIUM 9.2 9.2    GFR: Estimated Creatinine Clearance: 60.5 mL/min (by C-G formula based on SCr of 0.95 mg/dL).  Liver Function Tests: Recent Labs  Lab 12/19/20 1508  AST 19  ALT 17  ALKPHOS 90  BILITOT 0.7  PROT 6.1*  ALBUMIN 3.4*    CBG: Recent Labs  Lab 12/22/20 1214 12/22/20 1658 12/22/20 2009 12/23/20 0640 12/23/20 1158  GLUCAP 135* 175* 225* 239* 169*     Recent Results (from the past 240 hour(s))  Resp Panel by RT-PCR (Flu A&B, Covid) Nasopharyngeal Swab     Status: None   Collection Time: 12/19/20  6:38 PM   Specimen: Nasopharyngeal Swab; Nasopharyngeal(NP) swabs in vial transport medium  Result Value Ref Range Status   SARS Coronavirus 2 by RT PCR NEGATIVE NEGATIVE Final    Comment: (NOTE) SARS-CoV-2 target nucleic acids are NOT DETECTED.  The SARS-CoV-2 RNA is generally detectable in upper respiratory specimens during the acute phase of infection. The  lowest concentration of SARS-CoV-2 viral copies this assay can detect is 138 copies/mL. A negative result does not preclude SARS-Cov-2 infection and should not be used as the sole basis for treatment or other patient management decisions. A negative result may occur with  improper specimen collection/handling, submission of specimen other than nasopharyngeal swab, presence of viral mutation(s) within the areas targeted by this assay, and inadequate number of viral copies(<138 copies/mL). A negative result must be combined with clinical observations, patient history, and epidemiological information. The expected result is Negative.  Fact Sheet for Patients:  EntrepreneurPulse.com.au  Fact Sheet for Healthcare Providers:  IncredibleEmployment.be  This test is  no t yet approved or cleared by the [place] and  has been authorized for detection and/or diagnosis of SARS-CoV-2 by FDA under an Emergency Use Authorization (EUA). This EUA will remain  in effect (meaning this test can be used) for the duration of the COVID-19 declaration under Section 564(b)(1) of the Act, 21 U.S.C.section 360bbb-3(b)(1), unless the authorization is terminated  or revoked sooner.       Influenza A by PCR NEGATIVE NEGATIVE Final   Influenza B by PCR NEGATIVE NEGATIVE Final    Comment: (NOTE) The Xpert Xpress SARS-CoV-2/FLU/RSV plus assay is intended as an aid in the diagnosis of influenza from Nasopharyngeal swab specimens and should not be used as a sole basis for treatment. Nasal washings and aspirates are unacceptable for Xpert Xpress SARS-CoV-2/FLU/RSV testing.  Fact Sheet for Patients: EntrepreneurPulse.com.au  Fact Sheet for Healthcare Providers: IncredibleEmployment.be  This test is not yet approved or cleared by the [place] FDA and has been authorized for detection and/or diagnosis of SARS-CoV-2 by FDA under  an Emergency Use Authorization (EUA). This EUA will remain in effect (meaning this test can be used) for the duration of the COVID-19 declaration under Section 564(b)(1) of the Act, 21 U.S.C. section 360bbb-3(b)(1), unless the authorization is terminated or revoked.  Performed at [facility], [place] [address], [place], [place] [address]          Radiology Studies: No results found.      Scheduled Meds: . enoxaparin (LOVENOX) injection  40 mg Subcutaneous Q24H  . feeding supplement  237 mL Oral BID BM  . gabapentin  600 mg Oral TID  . glipiZIDE  20 mg Oral Daily  . insulin aspart  0-9 Units Subcutaneous TID WC  . insulin detemir  40 Units Subcutaneous Daily  . multivitamin with minerals  1 tablet Oral Daily  . QUEtiapine  400 mg Oral BID  . rosuvastatin  40 mg Oral Daily  . senna-docusate  1 tablet Oral BID  . sodium chloride flush  3 mL Intravenous Q12H   Continuous Infusions:   LOS: 4 days   Time spent: 50mins Greater than 50% of this time was spent in counseling, explanation of diagnosis, planning of further management, and coordination of care.   Voice Recognition Viviann Spare dictation system was used to create this note, attempts have been made to correct errors. Please contact the author with questions and/or clarifications.   Florencia Reasons, MD PhD FACP [facility]  Available via Epic secure chat 7am-7pm for nonurgent issues Please page for urgent issues To page the attending provider between 7A-7P or the covering provider during after hours 7P-7A, please log into the web site [email or web address] and access using universal [place] password for that web site. If you do not have the password, please call the hospital operator.    12/23/2020, 4:21 PM

## 2020-12-24 LAB — CBC
HCT: 32.5 % — ABNORMAL LOW (ref 36.0–46.0)
Hemoglobin: 10.6 g/dL — ABNORMAL LOW (ref 12.0–15.0)
MCH: 30.9 pg (ref 26.0–34.0)
MCHC: 32.6 g/dL (ref 30.0–36.0)
MCV: 94.8 fL (ref 80.0–100.0)
Platelets: 335 10*3/uL (ref 150–400)
RBC: 3.43 MIL/uL — ABNORMAL LOW (ref 3.87–5.11)
RDW: 14.7 % (ref 11.5–15.5)
WBC: 7.8 10*3/uL (ref 4.0–10.5)
nRBC: 0 % (ref 0.0–0.2)

## 2020-12-24 LAB — MAGNESIUM: Magnesium: 2 mg/dL (ref 1.7–2.4)

## 2020-12-24 LAB — GLUCOSE, CAPILLARY
Glucose-Capillary: 117 mg/dL — ABNORMAL HIGH (ref 70–99)
Glucose-Capillary: 171 mg/dL — ABNORMAL HIGH (ref 70–99)
Glucose-Capillary: 178 mg/dL — ABNORMAL HIGH (ref 70–99)
Glucose-Capillary: 191 mg/dL — ABNORMAL HIGH (ref 70–99)

## 2020-12-24 LAB — BASIC METABOLIC PANEL
Anion gap: 7 (ref 5–15)
BUN: 18 mg/dL (ref 8–23)
CO2: 26 mmol/L (ref 22–32)
Calcium: 9.2 mg/dL (ref 8.9–10.3)
Chloride: 105 mmol/L (ref 98–111)
Creatinine, Ser: 1 mg/dL (ref 0.44–1.00)
GFR, Estimated: >60 mL/min (ref >60)
Glucose, Bld: 144 mg/dL — ABNORMAL HIGH (ref 70–99)
Potassium: 4 mmol/L (ref 3.5–5.1)
Sodium: 138 mmol/L (ref 135–145)

## 2020-12-24 LAB — SURGICAL PCR SCREEN
MRSA, PCR: NEGATIVE
Staphylococcus aureus: NEGATIVE

## 2020-12-24 MED ORDER — ASCORBIC ACID 500 MG PO TABS
500.0000 mg | ORAL_TABLET | Freq: Every day | ORAL | Status: DC
  Administered 2020-12-24 – 2020-12-28 (×4): 500 mg via ORAL
  Filled 2020-12-24 (×4): qty 1

## 2020-12-24 NOTE — Progress Notes (Signed)
PROGRESS NOTE    Ashley [name]  [id number]:[id number] DOB: 06/05/1961 DOA: 12/18/2020 PCP: Elsie Stain, MD   Chief Complain: Weakness  Brief Narrative: Patient is a 60 year old female with history of insulin-dependent diabetes type 2, hyperlipidemia, bipolar disorder, tobacco abuse who was recently diagnosed with a stroke presented to the emergency department with complaints of progressive weakness.  Found to have severe C3-C4 cervical spinal stenosis with cord compression/cervical myelopathy due to large disc herniation.  Neurosurgery planning for ACDF on 12/25/2020.  Surgery delayed due to DAPT  Assessment & Plan:   Principal Problem:   Cervical spinal cord compression (HCC) Active Problems:   Hyperlipidemia   History of stroke   Bipolar affective disorder in remission ([place])   Tobacco user   Type 2 diabetes mellitus with diabetic autonomic (poly)neuropathy (HCC)   Malnutrition of moderate degree   Severe C3-C4 cervical stenosis with cord compression: Found to have cervical myelopathy due to herniated disc.  Neurosurgery following.  Planning for ACDF on 12/25/2020 after DAPT washout. She complains of weakness in the right upper extremity.  At baseline, she is ambulatory and uses walker/cane  Posterior left bladder lobe ischemic stroke: MRI done on 11/24/2020 showed small cortical left frontal ischemic stroke.  She was on aspirin and Plavix.  Currently on hold for neurosurgery.  Continue statin  Insulin-dependent diabetes type 2: Uncontrolled.  Hemoglobin A1c of 9.5 as per 2/22.  Continue home Levemir, sliding scale insulin.  Bipolar disorder: On Seroquel  Tobacco use: Smokes half to 1 pack a cigarette a day.  Advised for smoking cessation.  Declined nicotine patch.  UDS positive for THC.  Constipation: Continue bowel regimen.    Nutrition Problem: Moderate Malnutrition Etiology: social / environmental circumstances (inadequate intake due to unsanitary living conditions)       DVT prophylaxis:Lovenox Code Status: Full Family Communication: None at bedside Status is: Inpatient  Remains inpatient appropriate because:Inpatient level of care appropriate due to severity of illness   Dispo: The patient is from: Home              Anticipated d/c is to: Home              Patient currently is not medically stable to d/c.   Difficult to place patient No     Consultants: Neurosurgery  Procedures:  Antimicrobials:  Anti-infectives (From admission, onward)   None      Subjective:  Patient seen and examined at bedside this morning.  Hemodynamically stable.  Denies any new complaints today.  Objective: Vitals:   12/23/20 1634 12/23/20 2151 12/24/20 0314 12/24/20 0843  BP: (!) 155/90 109/75 115/78 120/65  Pulse: 84 84 77 85  Resp: 18 17 17 18   Temp: 98 F (36.7 C) 98.7 F (37.1 C) 98.3 F (36.8 C) 98 F (36.7 C)  TempSrc:  Oral Oral Oral  SpO2: 92% 99% 98% 98%  Weight:      Height:        Intake/Output Summary (Last 24 hours) at 12/24/2020 3664 Last data filed at 12/23/2020 1500 Gross per 24 hour  Intake 240 ml  Output --  Net 240 ml   Filed Weights   12/20/20 0100  Weight: 65.1 kg    Examination:  General exam: Appears calm and comfortable ,Not in distress,average built HEENT:PERRL,Oral mucosa moist, Ear/Nose normal on gross exam Respiratory system: Bilateral equal air entry, normal vesicular breath sounds, no wheezes or crackles  Cardiovascular system: S1 & S2 heard, RRR. No JVD, murmurs, rubs,  gallops or clicks. No pedal edema. Gastrointestinal system: Abdomen is nondistended, soft and nontender. No organomegaly or masses felt. Normal bowel sounds heard. Central nervous system: Alert and oriented. Right upper extremity weakness Extremities: No edema, no clubbing ,no cyanosis Skin: No rashes, lesions or ulcers,no icterus ,no pallor   Data Reviewed: I have personally reviewed following labs and imaging studies  CBC: Recent  Labs  Lab 12/19/20 1508 12/20/20 0233 12/24/20 0251  WBC 6.4 7.6 7.8  NEUTROABS 2.7  --   --   HGB 10.8* 11.6* 10.6*  HCT 32.9* 34.7* 32.5*  MCV 93.7 92.5 94.8  PLT 365 334 401   Basic Metabolic Panel: Recent Labs  Lab 12/19/20 1508 12/20/20 0233 12/24/20 0251  NA 139 139 138  K 3.7 4.1 4.0  CL 105 108 105  CO2 26 20* 26  GLUCOSE 171* 88 144*  BUN 15 18 18   CREATININE 0.87 0.95 1.00  CALCIUM 9.2 9.2 9.2  MG  --   --  2.0   GFR: Estimated Creatinine Clearance: 57.5 mL/min (by C-G formula based on SCr of 1 mg/dL). Liver Function Tests: Recent Labs  Lab 12/19/20 1508  AST 19  ALT 17  ALKPHOS 90  BILITOT 0.7  PROT 6.1*  ALBUMIN 3.4*   No results for input(s): LIPASE, AMYLASE in the last 168 hours. No results for input(s): AMMONIA in the last 168 hours. Coagulation Profile: No results for input(s): INR, PROTIME in the last 168 hours. Cardiac Enzymes: No results for input(s): CKTOTAL, CKMB, CKMBINDEX, TROPONINI in the last 168 hours. BNP (last 3 results) No results for input(s): PROBNP in the last 8760 hours. HbA1C: No results for input(s): HGBA1C in the last 72 hours. CBG: Recent Labs  Lab 12/23/20 0640 12/23/20 1158 12/23/20 1635 12/23/20 2037 12/24/20 0631  GLUCAP 239* 169* 168* 170* 117*   Lipid Profile: No results for input(s): CHOL, HDL, LDLCALC, TRIG, CHOLHDL, LDLDIRECT in the last 72 hours. Thyroid Function Tests: No results for input(s): TSH, T4TOTAL, FREET4, T3FREE, THYROIDAB in the last 72 hours. Anemia Panel: No results for input(s): VITAMINB12, FOLATE, FERRITIN, TIBC, IRON, RETICCTPCT in the last 72 hours. Sepsis Labs: No results for input(s): PROCALCITON, LATICACIDVEN in the last 168 hours.  Recent Results (from the past 240 hour(s))  Resp Panel by RT-PCR (Flu A&B, Covid) Nasopharyngeal Swab     Status: None   Collection Time: 12/19/20  6:38 PM   Specimen: Nasopharyngeal Swab; Nasopharyngeal(NP) swabs in vial transport medium  Result  Value Ref Range Status   SARS Coronavirus 2 by RT PCR NEGATIVE NEGATIVE Final    Comment: (NOTE) SARS-CoV-2 target nucleic acids are NOT DETECTED.  The SARS-CoV-2 RNA is generally detectable in upper respiratory specimens during the acute phase of infection. The lowest concentration of SARS-CoV-2 viral copies this assay can detect is 138 copies/mL. A negative result does not preclude SARS-Cov-2 infection and should not be used as the sole basis for treatment or other patient management decisions. A negative result may occur with  improper specimen collection/handling, submission of specimen other than nasopharyngeal swab, presence of viral mutation(s) within the areas targeted by this assay, and inadequate number of viral copies(<138 copies/mL). A negative result must be combined with clinical observations, patient history, and epidemiological information. The expected result is Negative.  Fact Sheet for Patients:  EntrepreneurPulse.com.au  Fact Sheet for Healthcare Providers:  IncredibleEmployment.be  This test is no t yet approved or cleared by the [place] FDA and  has been authorized for detection  and/or diagnosis of SARS-CoV-2 by FDA under an Emergency Use Authorization (EUA). This EUA will remain  in effect (meaning this test can be used) for the duration of the COVID-19 declaration under Section 564(b)(1) of the Act, 21 U.S.C.section 360bbb-3(b)(1), unless the authorization is terminated  or revoked sooner.       Influenza A by PCR NEGATIVE NEGATIVE Final   Influenza B by PCR NEGATIVE NEGATIVE Final    Comment: (NOTE) The Xpert Xpress SARS-CoV-2/FLU/RSV plus assay is intended as an aid in the diagnosis of influenza from Nasopharyngeal swab specimens and should not be used as a sole basis for treatment. Nasal washings and aspirates are unacceptable for Xpert Xpress SARS-CoV-2/FLU/RSV testing.  Fact Sheet for  Patients: EntrepreneurPulse.com.au  Fact Sheet for Healthcare Providers: IncredibleEmployment.be  This test is not yet approved or cleared by the [place] FDA and has been authorized for detection and/or diagnosis of SARS-CoV-2 by FDA under an Emergency Use Authorization (EUA). This EUA will remain in effect (meaning this test can be used) for the duration of the COVID-19 declaration under Section 564(b)(1) of the Act, 21 U.S.C. section 360bbb-3(b)(1), unless the authorization is terminated or revoked.  Performed at [facility], [place] [address], [place], [place] [address]          Radiology Studies: No results found.      Scheduled Meds: . enoxaparin (LOVENOX) injection  40 mg Subcutaneous Q24H  . feeding supplement  237 mL Oral BID BM  . gabapentin  600 mg Oral TID  . glipiZIDE  20 mg Oral Daily  . insulin aspart  0-9 Units Subcutaneous TID WC  . insulin detemir  40 Units Subcutaneous Daily  . multivitamin with minerals  1 tablet Oral Daily  . QUEtiapine  400 mg Oral BID  . rosuvastatin  40 mg Oral Daily  . senna-docusate  1 tablet Oral BID  . sodium chloride flush  3 mL Intravenous Q12H   Continuous Infusions:   LOS: 5 days    Time spent: 25 mins,More than 50% of that time was spent in counseling and/or coordination of care.      Shelly Coss, MD [facility] P3/21/2022, 9:36 AM

## 2020-12-24 NOTE — Progress Notes (Signed)
Neurosurgery Service Progress Note  Subjective: No acute events overnight, no new complaints this morning   Objective: Vitals:   12/23/20 2151 12/24/20 0314 12/24/20 0843 12/24/20 1210  BP: 109/75 115/78 120/65 102/70  Pulse: 84 77 85 91  Resp: 17 17 18 18   Temp: 98.7 F (37.1 C) 98.3 F (36.8 C) 98 F (36.7 C) 98 F (36.7 C)  TempSrc: Oral Oral Oral   SpO2: 99% 98% 98% 97%  Weight:      Height:        Physical Exam: Strength 4-/5 in RUE / RLE, 4/5 in LUE / LLE, +hoffman's on L  Assessment & Plan: 60 y.o. woman w/ recent stroke and large cervical disc herniation with myelopathy.   -OR tomorrow morning for ACDF, NPO p MN already ordered, appreciate care from hospitalist service, I d/c'd her enoxaparin  Judith Part  12/24/20 1:57 PM

## 2020-12-24 NOTE — Progress Notes (Signed)
Physical Therapy Treatment Patient Details Name: Ashley [name] MRN: [id number] DOB: October 25, 1960 Today's Date: 12/24/2020    History of Present Illness Pt is a 60 y/o female presenting to the ED after falls and weakness on R side that has been going on for ~3 weeks. Per notes, pt had MRI outside of hospital that showed L parietal CVA. 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 Pt was then admitted to hospital. PMH includes DM, HTN, and bipolar disorder.    PT Comments    Pt reports feeling very depressed and required increased encouragement to participate in PT session.  Pt largely limited from R sided weakness from CVA.  Plans for surgery tomorrow for ACDF.  Will continue to follow post surgery.  Recommendations remain for CIR placement at this time.     Follow Up Recommendations  CIR     Equipment Recommendations  Other (comment) (TBD)    Recommendations for Other Services       Precautions / Restrictions Precautions Precautions: Fall Precaution Comments: Residual R sided weakness from CVA Restrictions Weight Bearing Restrictions: No    Mobility  Bed Mobility Overal bed mobility: Needs Assistance Bed Mobility: Rolling;Sidelying to Sit;Sit to Sidelying     Supine to sit: Min guard Sit to supine: Supervision   General bed mobility comments: Min guard to come to sitting and supervision for back to bed.    Transfers Overall transfer level: Needs assistance Equipment used: Rolling walker (2 wheeled) Transfers: Sit to/from Stand Sit to Stand: Min assist         General transfer comment: Cues for hand placement to push with LUE this session.  Hand over hand assistance on R side.  Ambulation/Gait Ambulation/Gait assistance: Min assist;Mod assist Gait Distance (Feet): 70 Feet Assistive device: Rolling walker (2 wheeled) Gait Pattern/deviations: Step-through pattern;Decreased stride length;Trendelenburg      General Gait Details: Trendelenberg gait noted in RLE. Unsteady and requiring min up to mod A for stability. Pt with one instance of R knee buckling and required mod A for stability.  Mod assistance to turn R and to maintain placement of R hand.   Stairs             Wheelchair Mobility    Modified Rankin (Stroke Patients Only)       Balance Overall balance assessment: Needs assistance Sitting-balance support: No upper extremity supported;Feet supported Sitting balance-Leahy Scale: Fair     Standing balance support: No upper extremity supported Standing balance-Leahy Scale: Poor                              Cognition Arousal/Alertness: Awake/alert Behavior During Therapy: WFL for tasks assessed/performed Overall Cognitive Status: No family/caregiver present to determine baseline cognitive functioning                                 General Comments: Overall appropriate throughout. Mild decreased awareness of safety.      Exercises      General Comments        Pertinent Vitals/Pain Pain Assessment: Faces Faces Pain Scale: Hurts little more Pain Location: R pinky Pain Descriptors / Indicators: Discomfort Pain Intervention(s): Monitored during session;Repositioned    Home Living  Prior Function            PT Goals (current goals can now be found in the care plan section) Acute Rehab PT Goals Patient Stated Goal: to be independent, go to rehab and go home Potential to Achieve Goals: Good Progress towards PT goals: Progressing toward goals    Frequency    Min 3X/week      PT Plan Frequency needs to be updated;Equipment recommendations need to be updated;Current plan remains appropriate    Co-evaluation              AM-PAC PT "6 Clicks" Mobility   Outcome Measure  Help needed turning from your back to your side while in a flat bed without using bedrails?: A Little Help needed  moving from lying on your back to sitting on the side of a flat bed without using bedrails?: A Little Help needed moving to and from a bed to a chair (including a wheelchair)?: A Little Help needed standing up from a chair using your arms (e.g., wheelchair or bedside chair)?: A Lot Help needed to walk in hospital room?: A Lot Help needed climbing 3-5 steps with a railing? : Total 6 Click Score: 14    End of Session Equipment Utilized During Treatment: Gait belt Activity Tolerance: Patient tolerated treatment well Patient left: in bed;with call bell/phone within reach;with bed alarm set Nurse Communication: Mobility status PT Visit Diagnosis: Unsteadiness on feet (R26.81);Hemiplegia and hemiparesis Hemiplegia - Right/Left: Right Hemiplegia - dominant/non-dominant: Dominant Hemiplegia - caused by: Cerebral infarction     Time: 1030-1046 PT Time Calculation (min) (ACUTE ONLY): 16 min  Charges:  $Gait Training: 8-22 mins                     Erasmo Leventhal , PTA Acute Rehabilitation Services Pager [phone] Office [phone]     [name] 12/24/2020, 2:06 PM

## 2020-12-24 NOTE — Plan of Care (Signed)
Pt is alert and oriented x 4, laying in bed, not in distress, denies pain.  Problem: Health Behavior/Discharge Planning: Goal: Ability to manage health-related needs will improve Outcome: Progressing   Problem: Clinical Measurements: Goal: Ability to maintain clinical measurements within normal limits will improve Outcome: Progressing   Problem: Activity: Goal: Risk for activity intolerance will decrease Outcome: Progressing   Problem: Nutrition: Goal: Adequate nutrition will be maintained Outcome: Progressing   Problem: Coping: Goal: Level of anxiety will decrease Outcome: Progressing   Problem: Elimination: Goal: Will not experience complications related to bowel motility Outcome: Progressing   Problem: Pain Managment: Goal: General experience of comfort will improve Outcome: Progressing   Problem: Safety: Goal: Ability to remain free from injury will improve Outcome: Progressing   Problem: Skin Integrity: Goal: Risk for impaired skin integrity will decrease Outcome: Progressing

## 2020-12-25 ENCOUNTER — Inpatient Hospital Stay (HOSPITAL_COMMUNITY): Payer: 59 | Admitting: Certified Registered Nurse Anesthetist

## 2020-12-25 ENCOUNTER — Inpatient Hospital Stay (HOSPITAL_COMMUNITY)
Admission: EM | Disposition: A | Discharge status: Rehabilitation Facility | Payer: Self-pay | Source: Home / Self Care | Attending: Internal Medicine

## 2020-12-25 ENCOUNTER — Encounter (HOSPITAL_COMMUNITY): Payer: Self-pay | Admitting: Internal Medicine

## 2020-12-25 ENCOUNTER — Inpatient Hospital Stay (HOSPITAL_COMMUNITY): Payer: 59

## 2020-12-25 HISTORY — PX: ANTERIOR CERVICAL DECOMP/DISCECTOMY FUSION: SHX1161

## 2020-12-25 LAB — GLUCOSE, CAPILLARY
Glucose-Capillary: 178 mg/dL — ABNORMAL HIGH (ref 70–99)
Glucose-Capillary: 122 mg/dL — ABNORMAL HIGH (ref 70–99)
Glucose-Capillary: 174 mg/dL — ABNORMAL HIGH (ref 70–99)
Glucose-Capillary: 194 mg/dL — ABNORMAL HIGH (ref 70–99)
Glucose-Capillary: 368 mg/dL — ABNORMAL HIGH (ref 70–99)

## 2020-12-25 SURGERY — ANTERIOR CERVICAL DECOMPRESSION/DISCECTOMY FUSION 1 LEVEL
Anesthesia: General

## 2020-12-25 MED ORDER — OXYCODONE HCL 5 MG PO TABS: 5.0000 mg | ORAL_TABLET | ORAL | Status: DC | PRN

## 2020-12-25 MED ORDER — OXYCODONE HCL 5 MG PO TABS
10.0000 mg | ORAL_TABLET | ORAL | Status: DC | PRN
  Administered 2020-12-25 – 2020-12-28 (×9): 10 mg via ORAL
  Filled 2020-12-25 (×9): qty 2

## 2020-12-25 MED ORDER — PHENYLEPHRINE 40 MCG/ML (10ML) SYRINGE FOR IV PUSH (FOR BLOOD PRESSURE SUPPORT)
PREFILLED_SYRINGE | INTRAVENOUS | Status: DC | PRN
  Administered 2020-12-25: 80 ug via INTRAVENOUS
  Administered 2020-12-25 (×2): 120 ug via INTRAVENOUS

## 2020-12-25 MED ORDER — PHENYLEPHRINE HCL-NACL 10-0.9 MG/250ML-% IV SOLN
INTRAVENOUS | Status: DC | PRN
  Administered 2020-12-25: 25 ug/min via INTRAVENOUS

## 2020-12-25 MED ORDER — 0.9 % SODIUM CHLORIDE (POUR BTL) OPTIME
TOPICAL | Status: DC | PRN
  Administered 2020-12-25: 1000 mL

## 2020-12-25 MED ORDER — LIDOCAINE-EPINEPHRINE 1 %-1:100000 IJ SOLN
INTRAMUSCULAR | Status: DC | PRN
  Administered 2020-12-25: 8 mL

## 2020-12-25 MED ORDER — ACETAMINOPHEN 10 MG/ML IV SOLN
INTRAVENOUS | Status: DC | PRN
  Administered 2020-12-25: 1000 mg via INTRAVENOUS

## 2020-12-25 MED ORDER — CHLORHEXIDINE GLUCONATE 0.12 % MT SOLN: 15.0000 mL | Freq: Once | OROMUCOSAL | Status: AC

## 2020-12-25 MED ORDER — ONDANSETRON HCL 4 MG/2ML IJ SOLN
INTRAMUSCULAR | Status: AC
  Filled 2020-12-25: qty 2

## 2020-12-25 MED ORDER — DEXAMETHASONE SODIUM PHOSPHATE 10 MG/ML IJ SOLN
INTRAMUSCULAR | Status: AC
  Filled 2020-12-25: qty 1

## 2020-12-25 MED ORDER — FENTANYL CITRATE (PF) 250 MCG/5ML IJ SOLN
INTRAMUSCULAR | Status: AC
  Filled 2020-12-25: qty 5

## 2020-12-25 MED ORDER — ACETAMINOPHEN 10 MG/ML IV SOLN
INTRAVENOUS | Status: AC
  Filled 2020-12-25: qty 100

## 2020-12-25 MED ORDER — THROMBIN 5000 UNITS EX KIT
PACK | CUTANEOUS | Status: AC
  Filled 2020-12-25: qty 1

## 2020-12-25 MED ORDER — CEFAZOLIN SODIUM-DEXTROSE 1-4 GM/50ML-% IV SOLN
INTRAVENOUS | Status: DC | PRN
  Administered 2020-12-25: 2 g via INTRAVENOUS

## 2020-12-25 MED ORDER — LIDOCAINE 2% (20 MG/ML) 5 ML SYRINGE
INTRAMUSCULAR | Status: DC | PRN
  Administered 2020-12-25: 80 mg via INTRAVENOUS

## 2020-12-25 MED ORDER — PROPOFOL 10 MG/ML IV BOLUS
INTRAVENOUS | Status: DC | PRN
  Administered 2020-12-25 (×2): 50 mg via INTRAVENOUS

## 2020-12-25 MED ORDER — SUGAMMADEX SODIUM 200 MG/2ML IV SOLN
INTRAVENOUS | Status: DC | PRN
  Administered 2020-12-25: 200 mg via INTRAVENOUS

## 2020-12-25 MED ORDER — FLUMAZENIL 0.5 MG/5ML IV SOLN
INTRAVENOUS | Status: AC
  Filled 2020-12-25: qty 5

## 2020-12-25 MED ORDER — ALBUMIN HUMAN 5 % IV SOLN: INTRAVENOUS | Status: DC | PRN

## 2020-12-25 MED ORDER — MIDAZOLAM HCL 2 MG/2ML IJ SOLN
INTRAMUSCULAR | Status: AC
  Filled 2020-12-25: qty 2

## 2020-12-25 MED ORDER — HYDROMORPHONE HCL 1 MG/ML IJ SOLN: 0.5000 mg | INTRAMUSCULAR | Status: DC | PRN

## 2020-12-25 MED ORDER — ONDANSETRON HCL 4 MG/2ML IJ SOLN: 4.0000 mg | Freq: Once | INTRAMUSCULAR | Status: DC | PRN

## 2020-12-25 MED ORDER — PROPOFOL 10 MG/ML IV BOLUS
INTRAVENOUS | Status: AC
  Filled 2020-12-25: qty 20

## 2020-12-25 MED ORDER — THROMBIN 5000 UNITS EX SOLR
OROMUCOSAL | Status: DC | PRN
  Administered 2020-12-25: 5 mL via TOPICAL

## 2020-12-25 MED ORDER — ORAL CARE MOUTH RINSE: 15.0000 mL | Freq: Once | OROMUCOSAL | Status: AC

## 2020-12-25 MED ORDER — FENTANYL CITRATE (PF) 100 MCG/2ML IJ SOLN: 25.0000 ug | INTRAMUSCULAR | Status: DC | PRN

## 2020-12-25 MED ORDER — NALOXONE HCL 0.4 MG/ML IJ SOLN
INTRAMUSCULAR | Status: AC
  Filled 2020-12-25: qty 1

## 2020-12-25 MED ORDER — CHLORHEXIDINE GLUCONATE 0.12 % MT SOLN
OROMUCOSAL | Status: AC
  Administered 2020-12-25: 15 mL via OROMUCOSAL
  Filled 2020-12-25: qty 15

## 2020-12-25 MED ORDER — DEXAMETHASONE SODIUM PHOSPHATE 10 MG/ML IJ SOLN
INTRAMUSCULAR | Status: DC | PRN
  Administered 2020-12-25: 5 mg via INTRAVENOUS

## 2020-12-25 MED ORDER — ONDANSETRON HCL 4 MG/2ML IJ SOLN
INTRAMUSCULAR | Status: DC | PRN
  Administered 2020-12-25: 4 mg via INTRAVENOUS

## 2020-12-25 MED ORDER — MIDAZOLAM HCL 2 MG/2ML IJ SOLN
INTRAMUSCULAR | Status: DC | PRN
  Administered 2020-12-25: 2 mg via INTRAVENOUS

## 2020-12-25 MED ORDER — LACTATED RINGERS IV SOLN: INTRAVENOUS | Status: DC

## 2020-12-25 MED ORDER — FENTANYL CITRATE (PF) 250 MCG/5ML IJ SOLN
INTRAMUSCULAR | Status: DC | PRN
  Administered 2020-12-25: 100 ug via INTRAVENOUS

## 2020-12-25 MED ORDER — LIDOCAINE-EPINEPHRINE 1 %-1:100000 IJ SOLN
INTRAMUSCULAR | Status: AC
  Filled 2020-12-25: qty 1

## 2020-12-25 MED ORDER — ROCURONIUM BROMIDE 10 MG/ML (PF) SYRINGE
PREFILLED_SYRINGE | INTRAVENOUS | Status: DC | PRN
  Administered 2020-12-25: 60 mg via INTRAVENOUS
  Administered 2020-12-25: 10 mg via INTRAVENOUS

## 2020-12-25 SURGICAL SUPPLY — 55 items
ADH SKN CLS APL DERMABOND .7 (GAUZE/BANDAGES/DRESSINGS) ×1
APL SKNCLS STERI-STRIP NONHPOA (GAUZE/BANDAGES/DRESSINGS)
BAND INSRT 18 STRL LF DISP RB (MISCELLANEOUS) ×2
BAND RUBBER #18 3X1/16 STRL (MISCELLANEOUS) ×6 IMPLANT
BENZOIN TINCTURE PRP APPL 2/3 (GAUZE/BANDAGES/DRESSINGS) IMPLANT
BLADE CLIPPER SURG (BLADE) IMPLANT
BLADE SURG 11 STRL SS (BLADE) ×3 IMPLANT
BUR MATCHSTICK NEURO 3.0 LAGG (BURR) ×3 IMPLANT
CANISTER SUCT 3000ML PPV (MISCELLANEOUS) ×3 IMPLANT
COVER WAND RF STERILE (DRAPES) ×3 IMPLANT
DECANTER SPIKE VIAL GLASS SM (MISCELLANEOUS) ×3 IMPLANT
DERMABOND ADVANCED (GAUZE/BANDAGES/DRESSINGS) ×2
DERMABOND ADVANCED .7 DNX12 (GAUZE/BANDAGES/DRESSINGS) ×1 IMPLANT
DRAPE C-ARM 42X72 X-RAY (DRAPES) ×6 IMPLANT
DRAPE HALF SHEET 40X57 (DRAPES) IMPLANT
DRAPE LAPAROTOMY 100X72 PEDS (DRAPES) ×3 IMPLANT
DRAPE MICROSCOPE LEICA (MISCELLANEOUS) ×3 IMPLANT
DURAPREP 6ML APPLICATOR 50/CS (WOUND CARE) ×3 IMPLANT
ELECT COATED BLADE 2.86 ST (ELECTRODE) ×3 IMPLANT
ELECT REM PT RETURN 9FT ADLT (ELECTROSURGICAL) ×3
ELECTRODE REM PT RTRN 9FT ADLT (ELECTROSURGICAL) ×1 IMPLANT
GAUZE 4X4 16PLY RFD (DISPOSABLE) IMPLANT
GLOVE BIOGEL PI IND STRL 7.5 (GLOVE) ×2 IMPLANT
GLOVE BIOGEL PI INDICATOR 7.5 (GLOVE) ×4
GLOVE ECLIPSE 7.5 STRL STRAW (GLOVE) ×3 IMPLANT
GLOVE EXAM NITRILE LRG STRL (GLOVE) IMPLANT
GLOVE EXAM NITRILE XL STR (GLOVE) IMPLANT
GLOVE EXAM NITRILE XS STR PU (GLOVE) IMPLANT
GOWN STRL REUS W/ TWL LRG LVL3 (GOWN DISPOSABLE) ×2 IMPLANT
GOWN STRL REUS W/ TWL XL LVL3 (GOWN DISPOSABLE) IMPLANT
GOWN STRL REUS W/TWL 2XL LVL3 (GOWN DISPOSABLE) IMPLANT
GOWN STRL REUS W/TWL LRG LVL3 (GOWN DISPOSABLE) ×6
GOWN STRL REUS W/TWL XL LVL3 (GOWN DISPOSABLE)
HEMOSTAT POWDER KIT SURGIFOAM (HEMOSTASIS) ×3 IMPLANT
KIT BASIN OR (CUSTOM PROCEDURE TRAY) ×3 IMPLANT
KIT TURNOVER KIT B (KITS) ×3 IMPLANT
NDL SPNL 18GX3.5 QUINCKE PK (NEEDLE) ×1 IMPLANT
NEEDLE HYPO 22GX1.5 SAFETY (NEEDLE) ×3 IMPLANT
NEEDLE SPNL 18GX3.5 QUINCKE PK (NEEDLE) ×3 IMPLANT
NS IRRIG 1000ML POUR BTL (IV SOLUTION) ×3 IMPLANT
PACK LAMINECTOMY NEURO (CUSTOM PROCEDURE TRAY) ×3 IMPLANT
PAD ARMBOARD 7.5X6 YLW CONV (MISCELLANEOUS) ×9 IMPLANT
PIN DISTRACTION 14MM (PIN) IMPLANT
PLATE ELITE 21MM (Plate) ×2 IMPLANT
SCREW SELF TAP VAR 4.0X13 (Screw) ×8 IMPLANT
SPACER BONE CORNERSTONE 6X14 (Orthopedic Implant) ×2 IMPLANT
SPONGE INTESTINAL PEANUT (DISPOSABLE) ×3 IMPLANT
SPONGE SURGIFOAM ABS GEL SZ50 (HEMOSTASIS) IMPLANT
STAPLER VISISTAT 35W (STAPLE) IMPLANT
SUT MNCRL AB 3-0 PS2 18 (SUTURE) ×3 IMPLANT
SUT VIC AB 3-0 SH 8-18 (SUTURE) ×3 IMPLANT
TAPE CLOTH 3X10 TAN LF (GAUZE/BANDAGES/DRESSINGS) ×3 IMPLANT
TOWEL GREEN STERILE (TOWEL DISPOSABLE) ×3 IMPLANT
TOWEL GREEN STERILE FF (TOWEL DISPOSABLE) ×3 IMPLANT
WATER STERILE IRR 1000ML POUR (IV SOLUTION) ×3 IMPLANT

## 2020-12-25 NOTE — Progress Notes (Signed)
PROGRESS NOTE    Ashley [name]  OHY:[id number] DOB: [birth year] DOA: 12/18/2020 PCP: Elsie Stain, MD   Chief Complain: Weakness  Brief Narrative: Patient is a 60 year old female with history of insulin-dependent diabetes type 2, hyperlipidemia, bipolar disorder, tobacco abuse who was recently diagnosed with a stroke presented to the emergency department with complaints of progressive weakness.  Found to have severe C3-C4 cervical spinal stenosis with cord compression/cervical myelopathy due to large disc herniation.  Neurosurgery planning for ACDF on 12/25/2020.  Surgery delayed due to DAPT  Assessment & Plan:   Principal Problem:   Cervical spinal cord compression (HCC) Active Problems:   Hyperlipidemia   History of stroke   Bipolar affective disorder in remission ([place])   Tobacco user   Type 2 diabetes mellitus with diabetic autonomic (poly)neuropathy (HCC)   Malnutrition of moderate degree   Severe C3-C4 cervical stenosis with cord compression: Found to have cervical myelopathy due to herniated disc.  Neurosurgery following.  Planning for ACDF on 12/25/2020 after DAPT washout. She complains of weakness in the right upper extremity.  At baseline, she is ambulatory and uses walker/cane  Posterior left bladder lobe ischemic stroke: MRI done on 11/24/2020 showed small cortical left frontal ischemic stroke.  She was on aspirin and Plavix.  Currently on hold for neurosurgery.  Continue statin  Insulin-dependent diabetes type 2: Uncontrolled.  Hemoglobin A1c of 9.5 as per 2/22.  Continue home Levemir, sliding scale insulin.  Bipolar disorder: On Seroquel  Tobacco use: Smokes half to 1 pack a cigarette a day.  Advised for smoking cessation.  Declined nicotine patch.  UDS positive for THC.  Constipation: Continue bowel regimen.  Debility/deconditioning: Patient seen by PT and recommended CIR on discharge.    Nutrition Problem: Moderate Malnutrition Etiology: social /  environmental circumstances (inadequate intake due to unsanitary living conditions)      DVT prophylaxis:Lovenox Code Status: Full Family Communication: None at bedside Status is: Inpatient  Remains inpatient appropriate because:Inpatient level of care appropriate due to severity of illness   Dispo: The patient is from: Home              Anticipated d/c is to: Home              Patient currently is not medically stable to d/c.   Difficult to place patient No     Consultants: Neurosurgery  Procedures:  Antimicrobials:  Anti-infectives (From admission, onward)   None      Subjective:  Patient seen and examined the bedside this morning.  Hemodynamically stable.  Comfortable.  Sitting on the chair.  Waiting for surgery today.  Objective: Vitals:   12/24/20 0843 12/24/20 1210 12/24/20 2000 12/25/20 0300  BP: 120/65 102/70 131/79 118/68  Pulse: 85 91 82   Resp: 18 18 18 17   Temp: 98 F (36.7 C) 98 F (36.7 C) 98.1 F (36.7 C) 98.1 F (36.7 C)  TempSrc: Oral Oral Oral Oral  SpO2: 98% 97% 98% 99%  Weight:      Height:        Intake/Output Summary (Last 24 hours) at 12/25/2020 0744 Last data filed at 12/24/2020 1200 Gross per 24 hour  Intake 480 ml  Output --  Net 480 ml   Filed Weights   12/20/20 0100  Weight: 65.1 kg    Examination:  General exam: Overall comfortable, not in distress HEENT: PERRL Respiratory system:  no wheezes or crackles  Cardiovascular system: S1 & S2 heard, RRR.  Gastrointestinal system: Abdomen  is nondistended, soft and nontender. Central nervous system: Alert and oriented,right upper extremity weakness Extremities: No edema, no clubbing ,no cyanosis Skin: No rashes, no ulcers,no icterus    Data Reviewed: I have personally reviewed following labs and imaging studies  CBC: Recent Labs  Lab 12/19/20 1508 12/20/20 0233 12/24/20 0251  WBC 6.4 7.6 7.8  NEUTROABS 2.7  --   --   HGB 10.8* 11.6* 10.6*  HCT 32.9* 34.7* 32.5*   MCV 93.7 92.5 94.8  PLT 365 334 976   Basic Metabolic Panel: Recent Labs  Lab 12/19/20 1508 12/20/20 0233 12/24/20 0251  NA 139 139 138  K 3.7 4.1 4.0  CL 105 108 105  CO2 26 20* 26  GLUCOSE 171* 88 144*  BUN 15 18 18   CREATININE 0.87 0.95 1.00  CALCIUM 9.2 9.2 9.2  MG  --   --  2.0   GFR: Estimated Creatinine Clearance: 57.5 mL/min (by C-G formula based on SCr of 1 mg/dL). Liver Function Tests: Recent Labs  Lab 12/19/20 1508  AST 19  ALT 17  ALKPHOS 90  BILITOT 0.7  PROT 6.1*  ALBUMIN 3.4*   No results for input(s): LIPASE, AMYLASE in the last 168 hours. No results for input(s): AMMONIA in the last 168 hours. Coagulation Profile: No results for input(s): INR, PROTIME in the last 168 hours. Cardiac Enzymes: No results for input(s): CKTOTAL, CKMB, CKMBINDEX, TROPONINI in the last 168 hours. BNP (last 3 results) No results for input(s): PROBNP in the last 8760 hours. HbA1C: No results for input(s): HGBA1C in the last 72 hours. CBG: Recent Labs  Lab 12/24/20 0631 12/24/20 1206 12/24/20 1552 12/24/20 2131 12/25/20 0634  GLUCAP 117* 171* 178* 191* 178*   Lipid Profile: No results for input(s): CHOL, HDL, LDLCALC, TRIG, CHOLHDL, LDLDIRECT in the last 72 hours. Thyroid Function Tests: No results for input(s): TSH, T4TOTAL, FREET4, T3FREE, THYROIDAB in the last 72 hours. Anemia Panel: No results for input(s): VITAMINB12, FOLATE, FERRITIN, TIBC, IRON, RETICCTPCT in the last 72 hours. Sepsis Labs: No results for input(s): PROCALCITON, LATICACIDVEN in the last 168 hours.  Recent Results (from the past 240 hour(s))  Resp Panel by RT-PCR (Flu A&B, Covid) Nasopharyngeal Swab     Status: None   Collection Time: 12/19/20  6:38 PM   Specimen: Nasopharyngeal Swab; Nasopharyngeal(NP) swabs in vial transport medium  Result Value Ref Range Status   SARS Coronavirus 2 by RT PCR NEGATIVE NEGATIVE Final    Comment: (NOTE) SARS-CoV-2 target nucleic acids are NOT  DETECTED.  The SARS-CoV-2 RNA is generally detectable in upper respiratory specimens during the acute phase of infection. The lowest concentration of SARS-CoV-2 viral copies this assay can detect is 138 copies/mL. A negative result does not preclude SARS-Cov-2 infection and should not be used as the sole basis for treatment or other patient management decisions. A negative result may occur with  improper specimen collection/handling, submission of specimen other than nasopharyngeal swab, presence of viral mutation(s) within the areas targeted by this assay, and inadequate number of viral copies(<138 copies/mL). A negative result must be combined with clinical observations, patient history, and epidemiological information. The expected result is Negative.  Fact Sheet for Patients:  EntrepreneurPulse.com.au  Fact Sheet for Healthcare Providers:  IncredibleEmployment.be  This test is no t yet approved or cleared by the [place] FDA and  has been authorized for detection and/or diagnosis of SARS-CoV-2 by FDA under an Emergency Use Authorization (EUA). This EUA will remain  in effect (meaning this  test can be used) for the duration of the COVID-19 declaration under Section 564(b)(1) of the Act, 21 U.S.C.section 360bbb-3(b)(1), unless the authorization is terminated  or revoked sooner.       Influenza A by PCR NEGATIVE NEGATIVE Final   Influenza B by PCR NEGATIVE NEGATIVE Final    Comment: (NOTE) The Xpert Xpress SARS-CoV-2/FLU/RSV plus assay is intended as an aid in the diagnosis of influenza from Nasopharyngeal swab specimens and should not be used as a sole basis for treatment. Nasal washings and aspirates are unacceptable for Xpert Xpress SARS-CoV-2/FLU/RSV testing.  Fact Sheet for Patients: EntrepreneurPulse.com.au  Fact Sheet for Healthcare Providers: IncredibleEmployment.be  This test is not yet  approved or cleared by the [place] FDA and has been authorized for detection and/or diagnosis of SARS-CoV-2 by FDA under an Emergency Use Authorization (EUA). This EUA will remain in effect (meaning this test can be used) for the duration of the COVID-19 declaration under Section 564(b)(1) of the Act, 21 U.S.C. section 360bbb-3(b)(1), unless the authorization is terminated or revoked.  Performed at [facility], [place] [address] [place], [place] [address]   Surgical pcr screen     Status: None   Collection Time: 12/24/20  2:31 PM   Specimen: Nasal Mucosa; Nasal Swab  Result Value Ref Range Status   MRSA, PCR NEGATIVE NEGATIVE Final   Staphylococcus aureus NEGATIVE NEGATIVE Final    Comment: (NOTE) The Xpert SA Assay (FDA approved for NASAL specimens in patients 77 years of age and older), is one component of a comprehensive surveillance program. It is not intended to diagnose infection nor to guide or monitor treatment. Performed at [facility], [place] [address] [place], [place] [address]          Radiology Studies: No results found.      Scheduled Meds: . vitamin C  500 mg Oral Daily  . feeding supplement  237 mL Oral BID BM  . gabapentin  600 mg Oral TID  . glipiZIDE  20 mg Oral Daily  . insulin aspart  0-9 Units Subcutaneous TID WC  . insulin detemir  40 Units Subcutaneous Daily  . multivitamin with minerals  1 tablet Oral Daily  . QUEtiapine  400 mg Oral BID  . rosuvastatin  40 mg Oral Daily  . senna-docusate  1 tablet Oral BID  . sodium chloride flush  3 mL Intravenous Q12H   Continuous Infusions:   LOS: 6 days    Time spent: 25 mins,More than 50% of that time was spent in counseling and/or coordination of care.      Shelly Coss, MD [facility] P3/22/2022, 7:44 AM

## 2020-12-25 NOTE — Brief Op Note (Signed)
12/25/2020  3:04 PM  PATIENT:  Ashley [name]  60 y.o. female  PRE-OPERATIVE DIAGNOSIS:  C 3-4 HNP  POST-OPERATIVE DIAGNOSIS:  C 3-4 HNP  PROCEDURE:  Procedure(s) with comments: CERVICAL THREE-FOUR ANTERIOR CERVICAL DECOMPRESSION/DISCECTOMY FUSION (N/A) - CERVICAL THREE-FOUR ANTERIOR CERVICAL DECOMPRESSION/DISCECTOMY FUSION   SURGEON:  Surgeon(s) and Role:    * Caragh Gasper, Joyice Faster, MD - Primary  PHYSICIAN ASSISTANT:   ASSISTANTS: none   ANESTHESIA:   general  EBL:  30cc  BLOOD ADMINISTERED:none  DRAINS: none   LOCAL MEDICATIONS USED:  LIDOCAINE   SPECIMEN:  No Specimen  DISPOSITION OF SPECIMEN:  N/A  COUNTS:  YES  TOURNIQUET:  * No tourniquets in log *  DICTATION: .Note written in EPIC  [name] OF CARE: Admit to inpatient   PATIENT DISPOSITION:  PACU - hemodynamically stable.   Delay start of Pharmacological VTE agent (>24hrs) due to surgical blood loss or risk of bleeding: yes

## 2020-12-25 NOTE — Plan of Care (Signed)
Pt alert and oriented x 4, denies any pain 0/10, CBG checked, report given to Horris Latino, Therapist, sports.  Problem: Education: Goal: Knowledge of General Education information will improve Description: Including pain rating scale, medication(s)/side effects and non-pharmacologic comfort measures Outcome: Progressing   Problem: Health Behavior/Discharge Planning: Goal: Ability to manage health-related needs will improve Outcome: Progressing   Problem: Clinical Measurements: Goal: Ability to maintain clinical measurements within normal limits will improve Outcome: Progressing   Problem: Activity: Goal: Risk for activity intolerance will decrease Outcome: Progressing   Problem: Nutrition: Goal: Adequate nutrition will be maintained Outcome: Progressing   Problem: Coping: Goal: Level of anxiety will decrease Outcome: Progressing   Problem: Elimination: Goal: Will not experience complications related to bowel motility Outcome: Progressing   Problem: Pain Managment: Goal: General experience of comfort will improve Outcome: Progressing   Problem: Skin Integrity: Goal: Risk for impaired skin integrity will decrease Outcome: Progressing

## 2020-12-25 NOTE — Anesthesia Preprocedure Evaluation (Addendum)
Anesthesia Evaluation  Patient identified by MRN, date of birth, ID band Patient awake    Reviewed: Allergy & Precautions, NPO status , Patient's Chart, lab work & pertinent test results  Airway Mallampati: II  TM Distance: >3 FB Neck ROM: Limited    Dental  (+) Dental Advisory Given, Edentulous Upper, Edentulous Lower   Pulmonary Current Smoker and Patient abstained from smoking.,    Pulmonary exam normal breath sounds clear to auscultation       Cardiovascular hypertension, (-) angina+ Peripheral Vascular Disease  (-) CAD, (-) Past MI and (-) Cardiac Stents Normal cardiovascular exam+ dysrhythmias (RBBB)  Rhythm:Regular Rate:Normal     Neuro/Psych PSYCHIATRIC DISORDERS Anxiety Bipolar Disorder C 3-4 HNP  Neuromuscular disease CVA, No Residual Symptoms    GI/Hepatic Neg liver ROS, GERD  ,  Endo/Other  diabetes, Type 2, Oral Hypoglycemic Agents, Insulin Dependent  Renal/GU Renal InsufficiencyRenal disease     Musculoskeletal  (+) Arthritis ,   Abdominal   Peds  Hematology  (+) Blood dyscrasia (Plavix), anemia ,   Anesthesia Other Findings Day of surgery medications reviewed with the patient.  Reproductive/Obstetrics                             Anesthesia Physical Anesthesia Plan  ASA: III  Anesthesia Plan: General   Post-op Pain Management:    Induction: Intravenous  PONV Risk Score and Plan: 3 and Midazolam, Dexamethasone and Ondansetron  Airway Management Planned: Oral ETT and Video Laryngoscope Planned  Additional Equipment:   Intra-op Plan:   Post-operative Plan: Extubation in OR  Informed Consent: I have reviewed the patients History and Physical, chart, labs and discussed the procedure including the risks, benefits and alternatives for the proposed anesthesia with the patient or authorized representative who has indicated his/her understanding and acceptance.      Dental advisory given  Plan Discussed with: CRNA  Anesthesia Plan Comments:         Anesthesia Quick Evaluation

## 2020-12-25 NOTE — Op Note (Signed)
PATIENT: Ashley [name]  PROCEDURE DATE: 12/25/20  PRE-OPERATIVE DIAGNOSIS:  Cervical myelopathy   POST-OPERATIVE DIAGNOSIS:  Cervical myelopathy   PROCEDURE:  C3-C4 Anterior Cervical Discectomy and Instrumented Fusion   SURGEON:  Surgeon(s) and Role:    Judith Part, MD - Primary   ANESTHESIA: ETGA   BRIEF HISTORY: This is a 60 year old woman that presented with progressive right greater than left quadriparesis with a very large disc herniation at C3-4 with cord signal change. She was on DAPT for a recent left sided stroke, but she had continued weakness and her exam is more consistent with cervical myelopathy. We discussed treatment options and I recommended holding her DAPT and proceeding with an ACDF, which she agreed to. This was discussed with the patient extensively as well as risks, benefits, and alternatives and the patient wished to proceed with surgery.    OPERATIVE DETAIL: The patient was taken to the operating room and placed on the OR table in the supine position. A formal time out was performed with two patient identifiers and confirmed the operative site. Anesthesia was induced by the anesthesia team.  Fluoroscopy was used to localize the surgical level and an incision was marked in a skin crease. The area was then prepped and draped in a sterile fashion. A transverse linear incision was made on the right side of the neck. The platysma was divided and the sternocleidomastoid muscle was identified. The carotid sheath was palpated, identified, and retracted laterally with the sternocleidomastoid muscle. The strap muscles were identified and retracted medially and the pretracheal fascia was entered. A bent spinal needle was used with fluoroscopy to localize the surgical level after dissection. The longus colli were elevated bilaterally and a self-retaining retractor was placed. The endotracheal tube cuff balloon was deflated and reinflated after retractor placement.    Anterior osteophytes were removed until flush with the anterior vertebral body. The disc annulus was incised and a complete C3-C4 discectomy was performed. There was a large disc herniation present that was deforming the cord posteriorly. This was adherent to the dura and took significant care and time to dissect free without putting any tension on the cervical dura. I followed it cranially and had to remove a significant portion of the posterior vertebral body of C3 to fully remove it. A fluoro shot confirmed that my nerve hook reached just inferior to the C2-3 disc space and the cord was visually decompressed.   Decompression was then taken out laterally into the bilateral foramina until no foraminal stenosis was palpable. A 45mm cortical allograft (Medtronic) was inserted into the disc space as an interbody graft. An anterior plate ([employer]tronic) was positioned and 4, 31mm screws were used to secure the plate to the C3 and C4 vertebral bodies. Hemostasis was obtained and the incision was closed in layers. All instrument and sponge counts were correct. The patient was then returned to anesthesia for emergence. No apparent complications at the completion of the procedure.   EBL:  9mL   DRAINS: none   SPECIMENS: none   Judith Part, MD 12/25/20 12:01 PM

## 2020-12-25 NOTE — Anesthesia Procedure Notes (Signed)
Procedure Name: Intubation Date/Time: 12/25/2020 12:44 PM Performed by: Jenne Campus, CRNA Pre-anesthesia Checklist: Patient identified, Emergency Drugs available, Suction available and Patient being monitored Patient Re-evaluated:Patient Re-evaluated prior to induction Oxygen Delivery Method: Circle System Utilized Preoxygenation: Pre-oxygenation with 100% oxygen Induction Type: IV induction Ventilation: Mask ventilation without difficulty and Oral airway inserted - appropriate to patient size Laryngoscope Size: Glidescope and 3 Grade View: Grade I Tube type: Oral Number of attempts: 1 Airway Equipment and Method: Oral airway,  Rigid stylet and Video-laryngoscopy Placement Confirmation: ETT inserted through vocal cords under direct vision,  positive ETCO2 and breath sounds checked- equal and bilateral Secured at: 21 cm Tube secured with: Tape Dental Injury: Teeth and Oropharynx as per pre-operative assessment

## 2020-12-25 NOTE — Progress Notes (Signed)
PT Cancellation Note  Patient Details Name: Ashley [name] MRN: [id number] DOB: 16-Apr-1961   Cancelled Treatment:    Reason Eval/Treat Not Completed: Patient at procedure or test/unavailable - at ACDF procedure, PT to check back as schedule allows.  Stacie Glaze, PT Acute Rehabilitation Services Pager [phone]  Office [phone]    Louis Matte 12/25/2020, 11:54 AM

## 2020-12-25 NOTE — Progress Notes (Signed)
Pt is still having bladder spasms and she feels she is retaining. Paged [facility] on call with this information. Hal Hope gave telephone order to do another straight cath.

## 2020-12-25 NOTE — Progress Notes (Signed)
Neurosurgery Service Progress Note  Subjective: No acute events overnight, no new complaints this morning   Objective: Vitals:   12/24/20 1210 12/24/20 2000 12/25/20 0300 12/25/20 0835  BP: 102/70 131/79 118/68 (!) 107/92  Pulse: 91 82  81  Resp: 18 18 17 17   Temp: 98 F (36.7 C) 98.1 F (36.7 C) 98.1 F (36.7 C) (!) 97.5 F (36.4 C)  TempSrc: Oral Oral Oral Oral  SpO2: 97% 98% 99% 100%  Weight:      Height:        Physical Exam: Strength 4-/5 in RUE / RLE, 4/5 in LUE / LLE, +hoffman's on L  Assessment & Plan: 60 y.o. woman w/ recent stroke and large cervical disc herniation with myelopathy.   -OR today for ACDF  Judith Part  12/25/20 11:10 AM

## 2020-12-25 NOTE — Transfer of Care (Signed)
Immediate Anesthesia Transfer of Care Note  Patient: Ashley [name]  Procedure(s) Performed: CERVICAL THREE-FOUR ANTERIOR CERVICAL DECOMPRESSION/DISCECTOMY FUSION (N/A )  Patient Location: PACU  Anesthesia Type:General  Level of Consciousness: oriented, drowsy and patient cooperative  Airway & Oxygen Therapy: Patient Spontanous Breathing and Patient connected to nasal cannula oxygen  Post-op Assessment: Report given to RN and Post -op Vital signs reviewed and stable  Post vital signs: Reviewed  Last Vitals:  Vitals Value Taken Time  BP 151/70 12/25/20 1517  Temp 36.8 C 12/25/20 1517  Pulse 80 12/25/20 1520  Resp 21 12/25/20 1520  SpO2 96 % 12/25/20 1520  Vitals shown include unvalidated device data.  Last Pain:  Vitals:   12/25/20 1000  TempSrc:   PainSc: 0-No pain         Complications: No complications documented.

## 2020-12-25 NOTE — Progress Notes (Signed)
Neurosurgery Service Post-operative progress note  Assessment & Plan: 60 y.o. woman s/p C3-4 ACDF, seen in PACU, moving all four extremities, weaker on the right than left, consistent with preop exam.  -okay to return to 5N -activity as tolerated, no restrictions, no cervical collar needed -hold DVT chemoprophylaxis until POD2 (3/24), will likely restart ASA POD3 then clopidogrel POD7  Marcello Moores A Ostergard  12/25/20 3:10 PM

## 2020-12-25 NOTE — Anesthesia Postprocedure Evaluation (Signed)
Anesthesia Post Note  Patient: KEERTHANA VANROSSUM  Procedure(s) Performed: CERVICAL THREE-FOUR ANTERIOR CERVICAL DECOMPRESSION/DISCECTOMY FUSION (N/A )     Patient location during evaluation: PACU Anesthesia Type: General Level of consciousness: awake and alert Pain management: pain level controlled Vital Signs Assessment: post-procedure vital signs reviewed and stable Respiratory status: spontaneous breathing, nonlabored ventilation, respiratory function stable and patient connected to nasal cannula oxygen Cardiovascular status: blood pressure returned to baseline and stable Postop Assessment: no apparent nausea or vomiting Anesthetic complications: no   No complications documented.  Last Vitals:  Vitals:   12/25/20 1838 12/25/20 1956  BP:  (!) 189/96  Pulse:  94  Resp:  17  Temp:  36.9 C  SpO2: 100% 95%    Last Pain:  Vitals:   12/25/20 2012  TempSrc:   PainSc: [place]

## 2020-12-25 NOTE — Progress Notes (Addendum)
After changing bedpad Pt started to complain of SOB, denies pain to incision area, no difficulty swallowing, no hematoma noted. Vital signs taken and recorded, Pt's O2 sat on room air is 100%. MD updated, no new orders received, family at bedside updated. Will continue to monitor.  At 19:10 Pt endorsed accordingly to Viann Fish, Therapist, sports.

## 2020-12-26 ENCOUNTER — Encounter (HOSPITAL_COMMUNITY): Payer: Self-pay | Admitting: Neurological Surgery

## 2020-12-26 LAB — BASIC METABOLIC PANEL
Anion gap: 9 (ref 5–15)
BUN: 18 mg/dL (ref 8–23)
CO2: 26 mmol/L (ref 22–32)
Calcium: 9.7 mg/dL (ref 8.9–10.3)
Chloride: 100 mmol/L (ref 98–111)
Creatinine, Ser: 0.86 mg/dL (ref 0.44–1.00)
GFR, Estimated: >60 mL/min (ref >60)
Glucose, Bld: 399 mg/dL — ABNORMAL HIGH (ref 70–99)
Potassium: 4 mmol/L (ref 3.5–5.1)
Sodium: 135 mmol/L (ref 135–145)

## 2020-12-26 LAB — GLUCOSE, CAPILLARY
Glucose-Capillary: 242 mg/dL — ABNORMAL HIGH (ref 70–99)
Glucose-Capillary: 417 mg/dL — ABNORMAL HIGH (ref 70–99)
Glucose-Capillary: 378 mg/dL — ABNORMAL HIGH (ref 70–99)

## 2020-12-26 MED ORDER — CHLORHEXIDINE GLUCONATE CLOTH 2 % EX PADS
6.0000 | MEDICATED_PAD | Freq: Every day | CUTANEOUS | Status: DC
  Administered 2020-12-26 – 2020-12-28 (×3): 6 via TOPICAL

## 2020-12-26 MED ORDER — INSULIN ASPART 100 UNIT/ML ~~LOC~~ SOLN
0.0000 [IU] | Freq: Every day | SUBCUTANEOUS | Status: DC
  Administered 2020-12-26: 3 [IU] via SUBCUTANEOUS

## 2020-12-26 MED ORDER — HYDRALAZINE HCL 20 MG/ML IJ SOLN: 10.0000 mg | Freq: Four times a day (QID) | INTRAMUSCULAR | Status: DC | PRN

## 2020-12-26 MED ORDER — INSULIN ASPART 100 UNIT/ML ~~LOC~~ SOLN
10.0000 [IU] | Freq: Once | SUBCUTANEOUS | Status: AC
  Administered 2020-12-26: 10 [IU] via INTRAVENOUS

## 2020-12-26 MED ORDER — INSULIN ASPART 100 UNIT/ML ~~LOC~~ SOLN
5.0000 [IU] | Freq: Three times a day (TID) | SUBCUTANEOUS | Status: DC
  Administered 2020-12-26 – 2020-12-27 (×4): 5 [IU] via SUBCUTANEOUS

## 2020-12-26 MED ORDER — INSULIN ASPART 100 UNIT/ML ~~LOC~~ SOLN
0.0000 [IU] | Freq: Three times a day (TID) | SUBCUTANEOUS | Status: DC
  Administered 2020-12-26: 15 [IU] via SUBCUTANEOUS
  Administered 2020-12-27: 3 [IU] via SUBCUTANEOUS
  Administered 2020-12-27: 5 [IU] via SUBCUTANEOUS
  Administered 2020-12-27 – 2020-12-28 (×2): 3 [IU] via SUBCUTANEOUS
  Administered 2020-12-28: 11 [IU] via SUBCUTANEOUS
  Administered 2020-12-28: 3 [IU] via SUBCUTANEOUS

## 2020-12-26 MED FILL — Thrombin For Soln 5000 Unit: CUTANEOUS | Qty: 5000 | Status: CN

## 2020-12-26 MED FILL — Thrombin For Soln Kit 5000 Unit: CUTANEOUS | Qty: 1 | Status: AC

## 2020-12-26 NOTE — Progress Notes (Signed)
PROGRESS NOTE    Ashley [name]  VZS:[id number] DOB: Sep 10, 1961 DOA: 12/18/2020 PCP: Elsie Stain, MD   Chief Complain: Weakness  Brief Narrative: Patient is a 60 year old female with history of insulin-dependent diabetes type 2, hyperlipidemia, bipolar disorder, tobacco abuse who was recently diagnosed with a stroke presented to the emergency department with complaints of progressive weakness.  Found to have severe C3-C4 cervical spinal stenosis with cord compression/cervical myelopathy due to large disc herniation.  She underwent C3-C4 ACDF on 12/25/2020.    Assessment & Plan:   Principal Problem:   Cervical spinal cord compression (HCC) Active Problems:   Hyperlipidemia   History of stroke   Bipolar affective disorder in remission ([place])   Tobacco user   Type 2 diabetes mellitus with diabetic autonomic (poly)neuropathy (HCC)   Malnutrition of moderate degree   Severe C3-C4 cervical stenosis with cord compression: Found to have cervical myelopathy due to herniated disc.  Neurosurgery following. She underwent C3-C4  ACDF on 12/25/2020 after DAPT washout. She was complaining of weakness in the right upper extremity.  At baseline, she is ambulatory and uses walker/cane  Posterior left bladder lobe ischemic stroke: MRI done on 11/24/2020 showed small cortical left frontal ischemic stroke.  She was on aspirin and Plavix.  Will restart aspirin on postop day 3 and Plavix on postop day 7. currently on hold as per  neurosurgery.  Continue statin  Insulin-dependent diabetes type 2: Uncontrolled.  Hemoglobin A1c of 9.5 as per 2/22.  Continue home Levemir, sliding scale insulin.  Will add NovoLog 8 units tid  Bipolar disorder: On Seroquel  Tobacco use: Smokes half to 1 pack a cigarette a day.  Advised for smoking cessation.  Declined nicotine patch.  UDS positive for THC.  Constipation: Continue bowel regimen.  Hypertension: Most likely transient from pain.  Continue as needed  medications for now.  No history of hypertension and does not take any medication at home.  Debility/deconditioning: Patient seen by PT and recommended CIR on discharge.    Nutrition Problem: Moderate Malnutrition Etiology: social / environmental circumstances (inadequate intake due to unsanitary living conditions)      DVT prophylaxis:SCD Code Status: Full Family Communication: None at bedside Status is: Inpatient  Remains inpatient appropriate because:Inpatient level of care appropriate due to severity of illness   Dispo: The patient is from: Home              Anticipated d/c is to:CIR              Patient currently is not medically stable to d/c.   Difficult to place patient No     Consultants: Neurosurgery  Procedures:  Antimicrobials:  Anti-infectives (From admission, onward)   None      Subjective:  Patient seen and examined the bedside this morning.  Hemodynamically stable sitting in the chair.  She reports improvement in the strength of her right arm.  Denies any new complaints  Objective: Vitals:   12/25/20 1828 12/25/20 1838 12/25/20 1956 12/26/20 0400  BP: (!) 176/95  (!) 189/96 (!) 164/96  Pulse: 96  94 91  Resp: 20  17 18   Temp: (!) 97.4 F (36.3 C)  98.4 F (36.9 C) 97.9 F (36.6 C)  TempSrc: Oral   Oral  SpO2: 98% 100% 95% 100%  Weight:      Height:        Intake/Output Summary (Last 24 hours) at 12/26/2020 4492 Last data filed at 12/25/2020 2200 Gross per 24 hour  Intake 1613 ml  Output 2240 ml  Net -627 ml   Filed Weights   12/20/20 0100  Weight: 65.1 kg    Examination:   General exam: Overall comfortable, not in distress HEENT: PERRL, clean surgical incision on the anterior neck Respiratory system:  no wheezes or crackles  Cardiovascular system: S1 & S2 heard, RRR.  Gastrointestinal system: Abdomen is nondistended, soft and nontender. Central nervous system: Alert and oriented Extremities: Right upper extremity  weakness Skin: No rashes, no ulcers,no icterus   Data Reviewed: I have personally reviewed following labs and imaging studies  CBC: Recent Labs  Lab 12/19/20 1508 12/20/20 0233 12/24/20 0251  WBC 6.4 7.6 7.8  NEUTROABS 2.7  --   --   HGB 10.8* 11.6* 10.6*  HCT 32.9* 34.7* 32.5*  MCV 93.7 92.5 94.8  PLT 365 334 630   Basic Metabolic Panel: Recent Labs  Lab 12/19/20 1508 12/20/20 0233 12/24/20 0251  NA 139 139 138  K 3.7 4.1 4.0  CL 105 108 105  CO2 26 20* 26  GLUCOSE 171* 88 144*  BUN 15 18 18   CREATININE 0.87 0.95 1.00  CALCIUM 9.2 9.2 9.2  MG  --   --  2.0   GFR: Estimated Creatinine Clearance: 57.5 mL/min (by C-G formula based on SCr of 1 mg/dL). Liver Function Tests: Recent Labs  Lab 12/19/20 1508  AST 19  ALT 17  ALKPHOS 90  BILITOT 0.7  PROT 6.1*  ALBUMIN 3.4*   No results for input(s): LIPASE, AMYLASE in the last 168 hours. No results for input(s): AMMONIA in the last 168 hours. Coagulation Profile: No results for input(s): INR, PROTIME in the last 168 hours. Cardiac Enzymes: No results for input(s): CKTOTAL, CKMB, CKMBINDEX, TROPONINI in the last 168 hours. BNP (last 3 results) No results for input(s): PROBNP in the last 8760 hours. HbA1C: No results for input(s): HGBA1C in the last 72 hours. CBG: Recent Labs  Lab 12/25/20 1014 12/25/20 1517 12/25/20 1612 12/25/20 1955 12/26/20 0627  GLUCAP 122* 174* 194* 368* 242*   Lipid Profile: No results for input(s): CHOL, HDL, LDLCALC, TRIG, CHOLHDL, LDLDIRECT in the last 72 hours. Thyroid Function Tests: No results for input(s): TSH, T4TOTAL, FREET4, T3FREE, THYROIDAB in the last 72 hours. Anemia Panel: No results for input(s): VITAMINB12, FOLATE, FERRITIN, TIBC, IRON, RETICCTPCT in the last 72 hours. Sepsis Labs: No results for input(s): PROCALCITON, LATICACIDVEN in the last 168 hours.  Recent Results (from the past 240 hour(s))  Resp Panel by RT-PCR (Flu A&B, Covid) Nasopharyngeal Swab      Status: None   Collection Time: 12/19/20  6:38 PM   Specimen: Nasopharyngeal Swab; Nasopharyngeal(NP) swabs in vial transport medium  Result Value Ref Range Status   SARS Coronavirus 2 by RT PCR NEGATIVE NEGATIVE Final    Comment: (NOTE) SARS-CoV-2 target nucleic acids are NOT DETECTED.  The SARS-CoV-2 RNA is generally detectable in upper respiratory specimens during the acute phase of infection. The lowest concentration of SARS-CoV-2 viral copies this assay can detect is 138 copies/mL. A negative result does not preclude SARS-Cov-2 infection and should not be used as the sole basis for treatment or other patient management decisions. A negative result may occur with  improper specimen collection/handling, submission of specimen other than nasopharyngeal swab, presence of viral mutation(s) within the areas targeted by this assay, and inadequate number of viral copies(<138 copies/mL). A negative result must be combined with clinical observations, patient history, and epidemiological information. The expected result is Negative.  Fact Sheet for Patients:  EntrepreneurPulse.com.au  Fact Sheet for Healthcare Providers:  IncredibleEmployment.be  This test is no t yet approved or cleared by the [place] FDA and  has been authorized for detection and/or diagnosis of SARS-CoV-2 by FDA under an Emergency Use Authorization (EUA). This EUA will remain  in effect (meaning this test can be used) for the duration of the COVID-19 declaration under Section 564(b)(1) of the Act, 21 U.S.C.section 360bbb-3(b)(1), unless the authorization is terminated  or revoked sooner.       Influenza A by PCR NEGATIVE NEGATIVE Final   Influenza B by PCR NEGATIVE NEGATIVE Final    Comment: (NOTE) The Xpert Xpress SARS-CoV-2/FLU/RSV plus assay is intended as an aid in the diagnosis of influenza from Nasopharyngeal swab specimens and should not be used as a sole basis  for treatment. Nasal washings and aspirates are unacceptable for Xpert Xpress SARS-CoV-2/FLU/RSV testing.  Fact Sheet for Patients: EntrepreneurPulse.com.au  Fact Sheet for Healthcare Providers: IncredibleEmployment.be  This test is not yet approved or cleared by the [place] FDA and has been authorized for detection and/or diagnosis of SARS-CoV-2 by FDA under an Emergency Use Authorization (EUA). This EUA will remain in effect (meaning this test can be used) for the duration of the COVID-19 declaration under Section 564(b)(1) of the Act, 21 U.S.C. section 360bbb-3(b)(1), unless the authorization is terminated or revoked.  Performed at [facility], [place] [address] [place], [place] [address]   Surgical pcr screen     Status: None   Collection Time: 12/24/20  2:31 PM   Specimen: Nasal Mucosa; Nasal Swab  Result Value Ref Range Status   MRSA, PCR NEGATIVE NEGATIVE Final   Staphylococcus aureus NEGATIVE NEGATIVE Final    Comment: (NOTE) The Xpert SA Assay (FDA approved for NASAL specimens in patients 96 years of age and older), is one component of a comprehensive surveillance program. It is not intended to diagnose infection nor to guide or monitor treatment. Performed at [facility], [place] [address], [place], [place] [address]          Radiology Studies: DG Cervical Spine 2-3 Views  Result Date: 12/25/2020 CLINICAL DATA:  Anterior cervical discectomy and fusion, intraoperative examination EXAM: CERVICAL SPINE - 2-3 VIEW; DG C-ARM 1-60 MIN COMPARISON:  MRI 12/19/2020 FINDINGS: Two fluoroscopic intraoperative cross-table lateral radiographs of the cervical spine are presented postprocedurally. These images demonstrate, initially, a metallic probe overlying the anterior intervertebral disc space of C3-4. Subsequent images demonstrate anterior cervical discectomy and fusion with instrumentation at C3-4. A cylindrical metallic  density overlies the posterior elements of C3 and C4, likely an object overlying the patient. No unexpected fracture or listhesis of the visualized cervical spine on this limited examination. FLUOROSCOPY TIME:  Images: 2 Time: 1.26 minutes Dose: 95 mGy IMPRESSION: Intraoperative radiographs as described above. Electronically Signed   By: Fidela Salisbury MD   On: 12/25/2020 16:50   DG C-Arm 1-60 Min  Result Date: 12/25/2020 CLINICAL DATA:  Anterior cervical discectomy and fusion, intraoperative examination EXAM: CERVICAL SPINE - 2-3 VIEW; DG C-ARM 1-60 MIN COMPARISON:  MRI 12/19/2020 FINDINGS: Two fluoroscopic intraoperative cross-table lateral radiographs of the cervical spine are presented postprocedurally. These images demonstrate, initially, a metallic probe overlying the anterior intervertebral disc space of C3-4. Subsequent images demonstrate anterior cervical discectomy and fusion with instrumentation at C3-4. A cylindrical metallic density overlies the posterior elements of C3 and C4, likely an object overlying the patient. No unexpected fracture or listhesis  of the visualized cervical spine on this limited examination. FLUOROSCOPY TIME:  Images: 2 Time: 1.26 minutes Dose: 95 mGy IMPRESSION: Intraoperative radiographs as described above. Electronically Signed   By: Fidela Salisbury MD   On: 12/25/2020 16:50        Scheduled Meds: . vitamin C  500 mg Oral Daily  . feeding supplement  237 mL Oral BID BM  . gabapentin  600 mg Oral TID  . glipiZIDE  20 mg Oral Daily  . insulin aspart  0-9 Units Subcutaneous TID WC  . insulin detemir  40 Units Subcutaneous Daily  . multivitamin with minerals  1 tablet Oral Daily  . QUEtiapine  400 mg Oral BID  . rosuvastatin  40 mg Oral Daily  . senna-docusate  1 tablet Oral BID  . sodium chloride flush  3 mL Intravenous Q12H   Continuous Infusions:   LOS: 7 days    Time spent: 25 mins,More than 50% of that time was spent in counseling and/or coordination  of care.      Shelly Coss, MD [facility] P3/23/2022, 8:12 AM

## 2020-12-26 NOTE — Progress Notes (Signed)
Neurosurgery Service Progress Note  Subjective: No acute events overnight, feels like her strength and sensation are already significantly improving  Objective: Vitals:   12/25/20 1828 12/25/20 1838 12/25/20 1956 12/26/20 0400  BP: (!) 176/95  (!) 189/96 (!) 164/96  Pulse: 96  94 91  Resp: 20  17 18   Temp: (!) 97.4 F (36.3 C)  98.4 F (36.9 C) 97.9 F (36.6 C)  TempSrc: Oral   Oral  SpO2: 98% 100% 95% 100%  Weight:      Height:        Physical Exam: Strength 4- to 4/5 in RUE / RLE, 4/5 in LUE / LLE, +hoffman's on L Incision c/d/i  Assessment & Plan: 60 y.o. woman w/ recent stroke and large cervical disc herniation with myelopathy.  3/22 s/p 3-4 ACDF  -activity as tolerated, no rigid cervical collar needed -okay for DVT chemoprophylaxis POD2, aspirin POD3, plavix POD7  Judith Part  12/26/20 7:50 AM

## 2020-12-26 NOTE — Progress Notes (Signed)
PT Cancellation Note  Patient Details Name: Ashley [name] MRN: [id number] DOB: 09/15/1961   Cancelled Treatment:    Reason Eval/Treat Not Completed: (P) Other (comment) (Pt now s/p ACDF and will need supervising PT to reassess post surgical intervention, will inform supervising PT.)   [name] 12/26/2020, 11:49 AM  Erasmo Leventhal , PTA Acute Rehabilitation Services Pager [phone] Office [phone]

## 2020-12-26 NOTE — Progress Notes (Signed)
Pt stated she would like to sleep

## 2020-12-26 NOTE — Progress Notes (Signed)
Nutrition Follow Up  DOCUMENTATION CODES:   Non-severe (moderate) malnutrition in context of social or environmental circumstances  INTERVENTION:   -Ensure Enlive BID, each supplement provides 350 kcal, 20 grams protein  -MVI with minerals daily   NUTRITION DIAGNOSIS:   Moderate Malnutrition related to social / environmental circumstances (inadequate intake due to unsanitary living conditions) as evidenced by percent weight loss,moderate muscle depletion,mild fat depletion,mild muscle depletion,moderate fat depletion.  Ongoing   GOAL:   Patient will meet greater than or equal to 90% of their needs   Progressing   MONITOR:   PO intake,Supplement acceptance,Skin,I & O's,Labs,Weight trends  REASON FOR ASSESSMENT:   Malnutrition Screening Tool    ASSESSMENT:   89 YOF admitted for cervical spinal cord compression. PMH of GERD, bipolar disorder, PVD, T2DM, HTN, depression, anxiety, stroke (11/2020), daily marijuana use.  3/22- s/p C3-C4 anterior cervical discectomy and instrumental fusion  Patient reports appetite is stable. Last eight meal completions charted as 100%. Taking Ensure once daily but willing to have two.   Admission weight: 65.1 kg (no current wt listed)  UOP: 2200 ml x 24 hrs   Medications: glucotrol, SS novolog, levemir, senokot  Labs: CBG 122-417  Diet Order:   Diet Order            Diet regular Room service appropriate? Yes; Fluid consistency: Thin  Diet effective now                 EDUCATION NEEDS:   No education needs have been identified at this time  Skin:  Skin Assessment: Reviewed RN Assessment  Last BM:  3/20  Height:   Ht Readings from Last 1 Encounters:  12/20/20 5\' 7"  (1.702 m)    Weight:   Wt Readings from Last 1 Encounters:  12/20/20 65.1 kg    Ideal Body Weight:  63.6 kg  BMI:  Body mass index is 22.48 kg/m.  Estimated Nutritional Needs:   Kcal:  1700-1900  Protein:  85-100 g  Fluid:  >/=1.7 L  Ashley [name] RD, LDN Clinical Nutrition Pager listed in [place]

## 2020-12-26 NOTE — Progress Notes (Signed)
Occupational Therapy Treatment Patient Details Name: Ashley [name] MRN: [id number] DOB: [birth year] Today's Date: 12/26/2020    History of present illness Pt is a 60 y/o female presenting to the ED after falls and weakness on R side that has been going on for ~3 weeks. Per notes, pt had MRI outside of hospital that showed L parietal CVA. 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 Pt was then admitted to hospital. PMH includes DM, HTN, and bipolar disorder.   OT comments  Pt OOB pushing tray table across room upon arrival. Pt making progress with functional goals, however pt became annoyed when instructed to use RW for safey and stated "my balance and is excellent, I don't know why ya'll don't beleive me". Pt educated on ADL and ADL mobility safety. OT will continue to follow acutely to maximize level of function and safety  Follow Up Recommendations  CIR    Equipment Recommendations  3 in 1 bedside commode;Other (comment) (RW)    Recommendations for Other Services      Precautions / Restrictions Precautions Precautions: Fall Precaution Comments: Residual R sided weakness from CVA Restrictions Weight Bearing Restrictions: No       Mobility Bed Mobility Overal bed mobility: Needs Assistance Bed Mobility: Sit to Sidelying;Sit to Supine       Sit to supine: Supervision Sit to sidelying: Supervision General bed mobility comments: pt up pushing tray table to sink counter upon arrival, cues for safety to get back into bed    Transfers Overall transfer level: Needs assistance Equipment used: Rolling walker (2 wheeled) Transfers: Sit to/from Stand Sit to Stand: Min assist;Min guard         General transfer comment: Cues for hand placement to push with LUE this session.  Hand over hand assistance on R side. Pt became annoyed when instructed to use RW for safey and sttated "my balance and is excellent, I don;t know  why ya'll don't beleive me"    Balance Overall balance assessment: Needs assistance Sitting-balance support: No upper extremity supported;Feet supported Sitting balance-Leahy Scale: Fair     Standing balance support: No upper extremity supported;During functional activity Standing balance-Leahy Scale: Poor                             ADL either performed or assessed with clinical judgement   ADL Overall ADL's : Needs assistance/impaired     Grooming: Wash/dry face;Wash/dry hands;Standing;Min guard       Lower Body Bathing: Minimal assistance;Sitting/lateral leans;Sit to/from stand   Upper Body Dressing : Min guard;Sitting   Lower Body Dressing: Minimal assistance;Sitting/lateral leans;Sit to/from stand   Toilet Transfer: Ambulation;Comfort height toilet;Grab bars;Cueing for safety;Minimal assistance;Min guard;RW   Toileting- Clothing Manipulation and Hygiene: Min guard;Sit to/from stand       Functional mobility during ADLs: Cueing for safety;Cueing for sequencing;Minimal assistance;Min guard;Rolling walker       Vision Baseline Vision/History: Wears glasses Patient Visual Report: No change from baseline     Perception     Praxis      Cognition Arousal/Alertness: Awake/alert Behavior During Therapy: WFL for tasks assessed/performed Overall Cognitive Status: No family/caregiver present to determine baseline cognitive functioning                                 General Comments: decreased awareness of  safety and deficits        Exercises     Shoulder Instructions       General Comments      Pertinent Vitals/ Pain       Pain Assessment: No/denies pain Faces Pain Scale: No hurt Pain Intervention(s): Monitored during session;Repositioned  Home Living                                          Prior Functioning/Environment              Frequency  Min 2X/week        Progress Toward Goals  OT  Goals(current goals can now be found in the care plan section)  Progress towards OT goals: Progressing toward goals  Acute Rehab OT Goals Patient Stated Goal: to be independent, go to rehab and go home  Plan Discharge plan remains appropriate    Co-evaluation                 AM-PAC OT "6 Clicks" Daily Activity     Outcome Measure   Help from another person eating meals?: None Help from another person taking care of personal grooming?: A Little Help from another person toileting, which includes using toliet, bedpan, or urinal?: A Little Help from another person bathing (including washing, rinsing, drying)?: A Lot Help from another person to put on and taking off regular upper body clothing?: A Little Help from another person to put on and taking off regular lower body clothing?: A Lot 6 Click Score: 17    End of Session Equipment Utilized During Treatment: Gait belt;Rolling walker  OT Visit Diagnosis: Unsteadiness on feet (R26.81);Other abnormalities of gait and mobility (R26.89);Muscle weakness (generalized) (M62.81);History of falling (Z91.81);Other symptoms and signs involving the nervous system (R29.898);Other symptoms and signs involving cognitive function   Activity Tolerance Patient tolerated treatment well   Patient Left with call bell/phone within reach;in bed   Nurse Communication          Time: 8101-7510 OT Time Calculation (min): 24 min  Charges: OT General Charges $OT Visit: 1 Visit OT Treatments $Self Care/Home Management : 8-22 mins $Therapeutic Activity: 8-22 mins     Britt Bottom 12/26/2020, 1:26 PM

## 2020-12-26 NOTE — Progress Notes (Signed)
12/26/20 1611  PT Visit Information  Last PT Received On 12/26/20  Assistance Needed +1  History of Present Illness Pt is a 60 y/o female presenting to the ED after falls and weakness on R side that has been going on for ~3 weeks. Per notes, pt had MRI outside of hospital that showed L parietal CVA. 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 Pt was then admitted to hospital. PMH includes DM, HTN, and bipolar disorder. Pt is now s/p C3-4 ACDF on 3/22.  Subjective Data  Patient Stated Goal to be independent, go to rehab and go home  Precautions  Precautions Fall;Cervical  Precaution Booklet Issued No  Precaution Comments Reviewed cervical precautions with pt  Required Braces or Orthoses  (per notes, no brace required)  Restrictions  Weight Bearing Restrictions No  Pain Assessment  Pain Assessment Faces  Faces Pain Scale 4  Pain Location neck  Pain Descriptors / Indicators Discomfort  Pain Intervention(s) Limited activity within patient's tolerance;Monitored during session;Repositioned  Cognition  Arousal/Alertness Awake/alert  Behavior During Therapy [facility] for tasks assessed/performed  Overall Cognitive Status No family/caregiver present to determine baseline cognitive functioning  General Comments Pt with decreased awareness of current deficits and safety.  Bed Mobility  Overal bed mobility Needs Assistance  Bed Mobility Rolling;Sidelying to Sit  Rolling Supervision (usin bed rail)  Sidelying to sit Min guard  General bed mobility comments Increased time required. Cues for log roll technique in order to maintain cervical precautions.  Transfers  Overall transfer level Needs assistance  Equipment used None  Transfers Stand Pivot Transfers  Sit to Stand Min assist  General transfer comment Min A for steadying assist. Pt stating she did not want PT to assist despite needing assist.  Ambulation/Gait  Ambulation/Gait  assistance Min assist;Mod assist  Gait Distance (Feet) 20 Feet  Assistive device None  Gait Pattern/deviations Step-through pattern;Decreased stride length;Decreased weight shift to left  General Gait Details Pt with trendelenberg gait in RLE. Pt with functional weakness and requiring cues for step height on RLE. Pt unaware of deficits and reports "I've been walking like this all day, there's nothing wrong with the way I'm walking". Unsteady and requiring min up to mod A for steadying.  Gait velocity Decreased  Balance  Overall balance assessment Needs assistance  Sitting-balance support No upper extremity supported;Feet supported  Sitting balance-Leahy Scale Fair  Standing balance support No upper extremity supported;During functional activity  Standing balance-Leahy Scale Poor  Standing balance comment Reliant on external support  PT - End of Session  Equipment Utilized During Treatment Gait belt  Activity Tolerance Patient tolerated treatment well  Patient left in chair;with call bell/phone within reach;with nursing/sitter in room  Nurse Communication Mobility status   PT - Assessment/Plan  PT Plan Frequency needs to be updated  PT Visit Diagnosis Unsteadiness on feet (R26.81);Hemiplegia and hemiparesis  Hemiplegia - Right/Left Right  Hemiplegia - dominant/non-dominant Dominant  Hemiplegia - caused by Cerebral infarction  PT Frequency (ACUTE ONLY) Min 5X/week  Recommendations for Other Services OT consult  Follow Up Recommendations CIR  PT equipment Other (comment) (TBD)  AM-PAC PT "6 Clicks" Mobility Outcome Measure (Version 2)  Help needed turning from your back to your side while in a flat bed without using bedrails? 3  Help needed moving from lying on your back to sitting on the side of a flat bed without using bedrails? 3  Help needed moving to and  from a bed to a chair (including a wheelchair)? 3  Help needed standing up from a chair using your arms (e.g., wheelchair or  bedside chair)? 3  Help needed to walk in hospital room? 2  Help needed climbing 3-5 steps with a railing?  1  6 Click Score 15  Consider Recommendation of Discharge To: CIR/SNF/LTACH  PT Goal Progression  Progress towards PT goals Progressing toward goals  Acute Rehab PT Goals  PT Goal Formulation With patient  Time For Goal Achievement 01/01/21  Potential to Achieve Goals Good  PT Time Calculation  PT Start Time (ACUTE ONLY) 1349  PT Stop Time (ACUTE ONLY) 1406  PT Time Calculation (min) (ACUTE ONLY) 17 min  PT General Charges  $$ ACUTE PT VISIT 1 Visit  PT Evaluation  $PT Re-evaluation 1 Re-eval   Pt seen for re-eval following cervical spine surgery. Noted some improvement in RUE/RLE strength, however, continues to present with weakness. Pt requiring min up to mod A for gait without AD. Unaware of current deficits and required safety cues throughout. Current recommendations for CIR appropriate. Will continue to follow acutely.   Reuel Derby, PT, DPT  Acute Rehabilitation Services  Pager: [phone] Office: [phone]

## 2020-12-27 LAB — CBC WITH DIFFERENTIAL/PLATELET
Abs Immature Granulocytes: 0.04 10*3/uL (ref 0.00–0.07)
Basophils Absolute: 0.1 10*3/uL (ref 0.0–0.1)
Basophils Relative: 1 %
Eosinophils Absolute: 0.4 10*3/uL (ref 0.0–0.5)
Eosinophils Relative: 4 %
HCT: 35.9 % — ABNORMAL LOW (ref 36.0–46.0)
Hemoglobin: 11.4 g/dL — ABNORMAL LOW (ref 12.0–15.0)
Immature Granulocytes: 0 %
Lymphocytes Relative: 33 %
Lymphs Abs: 3.8 10*3/uL (ref 0.7–4.0)
MCH: 29.8 pg (ref 26.0–34.0)
MCHC: 31.8 g/dL (ref 30.0–36.0)
MCV: 94 fL (ref 80.0–100.0)
Monocytes Absolute: 0.9 10*3/uL (ref 0.1–1.0)
Monocytes Relative: 8 %
Neutro Abs: 6.2 10*3/uL (ref 1.7–7.7)
Neutrophils Relative %: 54 %
Platelets: 337 10*3/uL (ref 150–400)
RBC: 3.82 MIL/uL — ABNORMAL LOW (ref 3.87–5.11)
RDW: 14.6 % (ref 11.5–15.5)
WBC: 11.5 10*3/uL — ABNORMAL HIGH (ref 4.0–10.5)
nRBC: 0 % (ref 0.0–0.2)

## 2020-12-27 LAB — BASIC METABOLIC PANEL
Anion gap: 10 (ref 5–15)
BUN: 20 mg/dL (ref 8–23)
CO2: 24 mmol/L (ref 22–32)
Calcium: 9.7 mg/dL (ref 8.9–10.3)
Chloride: 103 mmol/L (ref 98–111)
Creatinine, Ser: 0.96 mg/dL (ref 0.44–1.00)
GFR, Estimated: >60 mL/min (ref >60)
Glucose, Bld: 193 mg/dL — ABNORMAL HIGH (ref 70–99)
Potassium: 4 mmol/L (ref 3.5–5.1)
Sodium: 137 mmol/L (ref 135–145)

## 2020-12-27 LAB — GLUCOSE, CAPILLARY
Glucose-Capillary: 282 mg/dL — ABNORMAL HIGH (ref 70–99)
Glucose-Capillary: 158 mg/dL — ABNORMAL HIGH (ref 70–99)
Glucose-Capillary: 185 mg/dL — ABNORMAL HIGH (ref 70–99)
Glucose-Capillary: 227 mg/dL — ABNORMAL HIGH (ref 70–99)
Glucose-Capillary: 114 mg/dL — ABNORMAL HIGH (ref 70–99)

## 2020-12-27 MED ORDER — ENOXAPARIN SODIUM 40 MG/0.4ML ~~LOC~~ SOLN
40.0000 mg | SUBCUTANEOUS | Status: DC
  Administered 2020-12-28: 40 mg via SUBCUTANEOUS
  Filled 2020-12-27: qty 0.4

## 2020-12-27 NOTE — Progress Notes (Signed)
Inpatient Rehab Admissions Coordinator:   Met with pt at bedside to discuss goals and expectations of CIR stay.  We discussed estimated length of stay to be about 2 weeks, with goals of supervision to mod I.  She has 2 daughters and a sister that are already planning on checking on her once she discharges from the hospital.  We discussed need for insurance authorization and she is agreeable.  Will start process today.   Shann Medal, PT, DPT Admissions Coordinator [phone] 12/27/20  4:28 PM

## 2020-12-27 NOTE — Progress Notes (Signed)
Neurosurgery Service Progress Note  Subjective: No acute events overnight, somnolent this morning but she attributes this to medication she got for pain after being sore from eating breakfast in a chair  Objective: Vitals:   12/26/20 0835 12/26/20 1350 12/26/20 2016 12/27/20 0755  BP: (!) 139/102 (!) 152/95 (!) 133/96 101/66  Pulse: (!) 101 96 98 97  Resp: 17 17 17 14   Temp: 98.5 F (36.9 C) 99.4 F (37.4 C) 97.8 F (36.6 C) 98.9 F (37.2 C)  TempSrc: Oral Oral Oral Oral  SpO2: 100% 95% 97% 96%  Weight:      Height:        Physical Exam: Strength 4- 4/5 in RUE / RLE, 4/5 in LUE / LLE Incision c/d/i  Assessment & Plan: 60 y.o. woman w/ recent stroke and large cervical disc herniation with myelopathy.  3/22 s/p 3-4 ACDF  -activity as tolerated, no rigid cervical collar needed -okay for DVT chemoprophylaxis POD2, aspirin POD3, plavix POD7  Judith Part  12/27/20 10:55 AM

## 2020-12-27 NOTE — Progress Notes (Signed)
Physical Therapy Treatment Patient Details Name: Ashley [name] MRN: [id number] DOB: 03/19/1961 Today's Date: 12/27/2020    History of Present Illness Pt is a 60 y/o female presenting to the ED after falls and weakness on R side that has been going on for ~3 weeks. Per notes, pt had MRI outside of hospital that showed L parietal CVA. 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 Pt was then admitted to hospital. PMH includes DM, HTN, and bipolar disorder. Pt is now s/p C3-4 ACDF on 3/22.    PT Comments    Pt supine in bed on arrival this session and very drowsy.  Pt agreeable to small bouts of gt training in room.  Continues with poor R foot clearance and poor dorsilfexion on R side this session.  May benefit from AFO verses ace wrapping into dorsiflexion. Continue to recommend rehab in a post acute setting to maximize functional gains.     Follow Up Recommendations  CIR     Equipment Recommendations  Other (comment) (TBD)    Recommendations for Other Services       Precautions / Restrictions Precautions Precautions: Fall;Cervical Precaution Booklet Issued: No Precaution Comments: Reviewed cervical precautions with pt Required Braces or Orthoses:  (per notes no brace required.) Restrictions Weight Bearing Restrictions: No    Mobility  Bed Mobility Overal bed mobility: Needs Assistance Bed Mobility: Rolling;Sidelying to Sit Rolling: Min assist (using bed rail.) Sidelying to sit: Min assist     Sit to sidelying: Min guard General bed mobility comments: Increased time required. Cues for log roll technique in order to maintain cervical precautions.  Pt twisting despite cues at times.    Transfers Overall transfer level: Needs assistance Equipment used: None Transfers: Sit to/from Stand Sit to Stand: Min assist         General transfer comment: Min assistance with cues for hand placement to and from seated  surface.  pt reaching to pull on RW into standing.  Ambulation/Gait Ambulation/Gait assistance: Min assist;Mod assist Gait Distance (Feet): 6 Feet (x2 trials.) Assistive device: Rolling walker (2 wheeled) Gait Pattern/deviations: Step-through pattern;Decreased stride length;Decreased weight shift to left;Trendelenburg Gait velocity: Decreased   General Gait Details: Pt continues with trendelenberg gt on R.  Poor foot clearance on R side.  Limited gt due to drowsiness.  Pt did use RW and able to maintain grip on R side this session.  Continues to require min assistance to turn and back with RW.   Stairs             Wheelchair Mobility    Modified Rankin (Stroke Patients Only)       Balance Overall balance assessment: Needs assistance Sitting-balance support: No upper extremity supported;Feet supported Sitting balance-Leahy Scale: Fair       Standing balance-Leahy Scale: Poor Standing balance comment: Reliant on external support                            Cognition Arousal/Alertness: Awake/alert Behavior During Therapy: WFL for tasks assessed/performed Overall Cognitive Status: No family/caregiver present to determine baseline cognitive functioning                                 General Comments: Pt with decreased awareness of current deficits and safety.      Exercises  General Comments        Pertinent Vitals/Pain Pain Assessment: Faces Faces Pain Scale: Hurts little more Pain Location: neck Pain Descriptors / Indicators: Discomfort Pain Intervention(s): Monitored during session;Repositioned    Home Living                      Prior Function            PT Goals (current goals can now be found in the care plan section) Acute Rehab PT Goals Patient Stated Goal: To go back to bed and get a nap Potential to Achieve Goals: Good Progress towards PT goals: Progressing toward goals    Frequency    Min  5X/week      PT Plan Current plan remains appropriate    Co-evaluation              AM-PAC PT "6 Clicks" Mobility   Outcome Measure  Help needed turning from your back to your side while in a flat bed without using bedrails?: A Little Help needed moving from lying on your back to sitting on the side of a flat bed without using bedrails?: A Little Help needed moving to and from a bed to a chair (including a wheelchair)?: A Little Help needed standing up from a chair using your arms (e.g., wheelchair or bedside chair)?: A Little Help needed to walk in hospital room?: A Lot Help needed climbing 3-5 steps with a railing? : A Lot 6 Click Score: 16    End of Session Equipment Utilized During Treatment: Gait belt Activity Tolerance: Patient tolerated treatment well Patient left: in chair;with call bell/phone within reach;with nursing/sitter in room Nurse Communication: Mobility status PT Visit Diagnosis: Unsteadiness on feet (R26.81);Hemiplegia and hemiparesis Hemiplegia - Right/Left: Right Hemiplegia - dominant/non-dominant: Dominant Hemiplegia - caused by: Cerebral infarction     Time: 0240-9735 PT Time Calculation (min) (ACUTE ONLY): 13 min  Charges:  $Therapeutic Activity: 8-22 mins                     Erasmo Leventhal , PTA Acute Rehabilitation Services Pager [phone] Office [phone]     Ashley [name] Ashley [name] 12/27/2020, 4:44 PM

## 2020-12-27 NOTE — TOC Progression Note (Addendum)
Transition of Care [facility]) - Progression Note    Patient Details  Name: Ashley [name] MRN: [id number] Date of Birth: October 11, 1960  Transition of Care [facility]) CM/SW Contact  Sharin Mons, RN Phone Number: 12/27/2020, 11:37 AM  Clinical Narrative:    Per CIR admission liaison insurance auth.will be initiated for CIR placement. TOC team will continue to monitor and assist with needs..     Expected Discharge Plan: IP Rehab Facility Barriers to Discharge: Insurance Authorization  Expected Discharge Plan and Services Expected Discharge Plan: [place]                                               Social Determinants of Health (SDOH) Interventions    Readmission Risk Interventions No flowsheet data found.

## 2020-12-27 NOTE — Progress Notes (Signed)
  ANTICOAGULATION CONSULT NOTE - Initial Consult  Pharmacy Consult for Enoxaparin Indication: VTE prophylaxis  No Known Allergies  Patient Measurements: Height: 5\' 7"  (170.2 cm) Weight: 65.1 kg (143 lb 8.3 oz) IBW/kg (Calculated) : 61.6   Vital Signs: Temp: 98.9 F (37.2 C) (03/24 0755) Temp Source: Oral (03/24 0755) BP: 101/66 (03/24 0755) Pulse Rate: 97 (03/24 0755)  Labs: Recent Labs    12/26/20 1253 12/27/20 0353  HGB  --  11.4*  HCT  --  35.9*  PLT  --  337  CREATININE 0.86 0.96    Estimated Creatinine Clearance: 59.8 mL/min (by C-G formula based on SCr of 0.96 mg/dL).   Medical History: Past Medical History:  Diagnosis Date  . Anxiety   . Arthritis   . Bipolar 1 disorder ([place])   . Depression   . Diabetes mellitus without complication ([place])    Type II  . GERD (gastroesophageal reflux disease)   . Heart murmur    "nothing to worry about"  . HTN (hypertension)    not on medication  . Stroke [facility]) 11/2020   Assessment: 60 YO W w/ small CVA 11/24/2020 now admitted with large cervical disc herniation with myelopathy while on DAPT. Patient is s/p 3-4 ACDF 3/22. Pharmacy is consulted to start enoxaparin for DVT prophylaxis. Per NSG, ok to start anticoagulation on POD 2. Also starting aspirin POD 3 and clopidogrel POD 7.  ClCr ~59 ml/min. H/H, plt stable since surgery.    Goal of Therapy:  Monitor platelets by anticoagulation protocol: Yes   Plan:  Enoxaparin 40mg  q24 hr on 3/25 Monitor s/sx bleeding    Benetta Spar, PharmD, BCPS, [facility] Clinical Pharmacist  Please check AMION for all [place] phone numbers After 10:00 PM, call [place] [phone]

## 2020-12-27 NOTE — Plan of Care (Signed)
  Problem: Education: Goal: Knowledge of General Education information will improve Description: Including pain rating scale, medication(s)/side effects and non-pharmacologic comfort measures Outcome: Progressing   Problem: Activity: Goal: Risk for activity intolerance will decrease Outcome: Progressing   Problem: Nutrition: Goal: Adequate nutrition will be maintained Outcome: Progressing   

## 2020-12-27 NOTE — Progress Notes (Signed)
PROGRESS NOTE    Ashley [name]  TFT:[id number] DOB: 09-09-1961 DOA: 12/18/2020 PCP: Elsie Stain, MD   Chief Complain: Weakness  Brief Narrative: Patient is a 60 year old female with history of insulin-dependent diabetes type 2, hyperlipidemia, bipolar disorder, tobacco abuse who was recently diagnosed with a stroke presented to the emergency department with complaints of progressive weakness.  Found to have severe C3-C4 cervical spinal stenosis with cord compression/cervical myelopathy due to large disc herniation.  She underwent C3-C4 ACDF on 12/25/2020.  PT/OT recommending CIR.TOC consulted  Assessment & Plan:   Principal Problem:   Cervical spinal cord compression (HCC) Active Problems:   Hyperlipidemia   History of stroke   Bipolar affective disorder in remission ([place])   Tobacco user   Type 2 diabetes mellitus with diabetic autonomic (poly)neuropathy (HCC)   Malnutrition of moderate degree   Severe C3-C4 cervical stenosis with cord compression: Found to have cervical myelopathy due to herniated disc.  Neurosurgery following. She underwent C3-C4  ACDF on 12/25/2020 after DAPT washout. She was complaining of weakness in the right upper extremity,now slowly improving.  At baseline, she is ambulatory and uses walker/cane  Posterior left bladder lobe ischemic stroke: MRI done on 11/24/2020 showed small cortical left frontal ischemic stroke.  She was on aspirin and Plavix.  Will restart aspirin on postop day 3 and Plavix on postop day 7. currently on hold as per  neurosurgery.  Continue statin  Insulin-dependent diabetes type 2: Uncontrolled.  Hemoglobin A1c of 9.5 as per 2/22.  Continue home Levemir, sliding scale insulin.  Will add NovoLog 8 units tid  Bipolar disorder: On Seroquel  Tobacco use: Smokes half to 1 pack a cigarette a day.  Advised for smoking cessation.  Declined nicotine patch.  UDS positive for THC.  Constipation: Continue bowel regimen.  Hypertension:  Transient hypertension due to pain, now resolved. no history of hypertension and does not take any medication at home.  Debility/deconditioning: Patient seen by PT and recommended CIR on discharge.    Nutrition Problem: Moderate Malnutrition Etiology: social / environmental circumstances (inadequate intake due to unsanitary living conditions)      DVT prophylaxis:SCD Code Status: Full Family Communication: None at bedside Status is: Inpatient  Remains inpatient appropriate because:Inpatient level of care appropriate due to severity of illness   Dispo: The patient is from: Home              Anticipated d/c is to:CIR              Patient currently is not medically stable to d/c.   Difficult to place patient No     Consultants: Neurosurgery  Procedures:  Antimicrobials:  Anti-infectives (From admission, onward)   None      Subjective:  Patient seen and examined the bedside this morning.  Hemodynamically stable.  Comfortable, denies any new complaints. Objective: Vitals:   12/26/20 0800 12/26/20 0835 12/26/20 1350 12/26/20 2016  BP:  (!) 139/102 (!) 152/95 (!) 133/96  Pulse:  (!) 101 96 98  Resp: 18 17 17 17   Temp:  98.5 F (36.9 C) 99.4 F (37.4 C) 97.8 F (36.6 C)  TempSrc:  Oral Oral Oral  SpO2: 100% 100% 95% 97%  Weight:      Height:        Intake/Output Summary (Last 24 hours) at 12/27/2020 0747 Last data filed at 12/27/2020 0644 Gross per 24 hour  Intake --  Output 2100 ml  Net -2100 ml   [employer]  12/20/20 0100  Weight: 65.1 kg    Examination:   General exam: Appears calm and comfortable ,Not in distress,average built HEENT: Clean surgical wound on the anterior neck Respiratory system: Bilateral equal air entry, normal vesicular breath sounds, no wheezes or crackles  Cardiovascular system: S1 & S2 heard, RRR. No JVD, murmurs, rubs, gallops or clicks. Gastrointestinal system: Abdomen is nondistended, soft and nontender. No organomegaly  or masses felt. Normal bowel sounds heard. Central nervous system: Alert and oriented.  Right upper arm extremity weakness, motor strength of 4/5 Extremities: No edema, no clubbing ,no cyanosis Skin: No rashes, lesions or ulcers,no icterus ,no pallor   Data Reviewed: I have personally reviewed following labs and imaging studies  CBC: Recent Labs  Lab 12/24/20 0251 12/27/20 0353  WBC 7.8 11.5*  NEUTROABS  --  6.2  HGB 10.6* 11.4*  HCT 32.5* 35.9*  MCV 94.8 94.0  PLT 335 970   Basic Metabolic Panel: Recent Labs  Lab 12/24/20 0251 12/26/20 1253 12/27/20 0353  NA 138 135 137  K 4.0 4.0 4.0  CL 105 100 103  CO2 26 26 24   GLUCOSE 144* 399* 193*  BUN 18 18 20   CREATININE 1.00 0.86 0.96  CALCIUM 9.2 9.7 9.7  MG 2.0  --   --    GFR: Estimated Creatinine Clearance: 59.8 mL/min (by C-G formula based on SCr of 0.96 mg/dL). Liver Function Tests: No results for input(s): AST, ALT, ALKPHOS, BILITOT, PROT, ALBUMIN in the last 168 hours. No results for input(s): LIPASE, AMYLASE in the last 168 hours. No results for input(s): AMMONIA in the last 168 hours. Coagulation Profile: No results for input(s): INR, PROTIME in the last 168 hours. Cardiac Enzymes: No results for input(s): CKTOTAL, CKMB, CKMBINDEX, TROPONINI in the last 168 hours. BNP (last 3 results) No results for input(s): PROBNP in the last 8760 hours. HbA1C: No results for input(s): HGBA1C in the last 72 hours. CBG: Recent Labs  Lab 12/25/20 1612 12/25/20 1955 12/26/20 0627 12/26/20 1121 12/26/20 1556  GLUCAP 194* 368* 242* 417* 378*   Lipid Profile: No results for input(s): CHOL, HDL, LDLCALC, TRIG, CHOLHDL, LDLDIRECT in the last 72 hours. Thyroid Function Tests: No results for input(s): TSH, T4TOTAL, FREET4, T3FREE, THYROIDAB in the last 72 hours. Anemia Panel: No results for input(s): VITAMINB12, FOLATE, FERRITIN, TIBC, IRON, RETICCTPCT in the last 72 hours. Sepsis Labs: No results for input(s):  PROCALCITON, LATICACIDVEN in the last 168 hours.  Recent Results (from the past 240 hour(s))  Resp Panel by RT-PCR (Flu A&B, Covid) Nasopharyngeal Swab     Status: None   Collection Time: 12/19/20  6:38 PM   Specimen: Nasopharyngeal Swab; Nasopharyngeal(NP) swabs in vial transport medium  Result Value Ref Range Status   SARS Coronavirus 2 by RT PCR NEGATIVE NEGATIVE Final    Comment: (NOTE) SARS-CoV-2 target nucleic acids are NOT DETECTED.  The SARS-CoV-2 RNA is generally detectable in upper respiratory specimens during the acute phase of infection. The lowest concentration of SARS-CoV-2 viral copies this assay can detect is 138 copies/mL. A negative result does not preclude SARS-Cov-2 infection and should not be used as the sole basis for treatment or other patient management decisions. A negative result may occur with  improper specimen collection/handling, submission of specimen other than nasopharyngeal swab, presence of viral mutation(s) within the areas targeted by this assay, and inadequate number of viral copies(<138 copies/mL). A negative result must be combined with clinical observations, patient history, and epidemiological information. The expected result is  Negative.  Fact Sheet for Patients:  EntrepreneurPulse.com.au  Fact Sheet for Healthcare Providers:  IncredibleEmployment.be  This test is no t yet approved or cleared by the [place] FDA and  has been authorized for detection and/or diagnosis of SARS-CoV-2 by FDA under an Emergency Use Authorization (EUA). This EUA will remain  in effect (meaning this test can be used) for the duration of the COVID-19 declaration under Section 564(b)(1) of the Act, 21 U.S.C.section 360bbb-3(b)(1), unless the authorization is terminated  or revoked sooner.       Influenza A by PCR NEGATIVE NEGATIVE Final   Influenza B by PCR NEGATIVE NEGATIVE Final    Comment: (NOTE) The Xpert Xpress  SARS-CoV-2/FLU/RSV plus assay is intended as an aid in the diagnosis of influenza from Nasopharyngeal swab specimens and should not be used as a sole basis for treatment. Nasal washings and aspirates are unacceptable for Xpert Xpress SARS-CoV-2/FLU/RSV testing.  Fact Sheet for Patients: EntrepreneurPulse.com.au  Fact Sheet for Healthcare Providers: IncredibleEmployment.be  This test is not yet approved or cleared by the [place] FDA and has been authorized for detection and/or diagnosis of SARS-CoV-2 by FDA under an Emergency Use Authorization (EUA). This EUA will remain in effect (meaning this test can be used) for the duration of the COVID-19 declaration under Section 564(b)(1) of the Act, 21 U.S.C. section 360bbb-3(b)(1), unless the authorization is terminated or revoked.  Performed at [facility], [place] [address] [place], [place] [address]   Surgical pcr screen     Status: None   Collection Time: 12/24/20  2:31 PM   Specimen: Nasal Mucosa; Nasal Swab  Result Value Ref Range Status   MRSA, PCR NEGATIVE NEGATIVE Final   Staphylococcus aureus NEGATIVE NEGATIVE Final    Comment: (NOTE) The Xpert SA Assay (FDA approved for NASAL specimens in patients 34 years of age and older), is one component of a comprehensive surveillance program. It is not intended to diagnose infection nor to guide or monitor treatment. Performed at [facility], [place] [address] [place], [place] [address]          Radiology Studies: DG Cervical Spine 2-3 Views  Result Date: 12/25/2020 CLINICAL DATA:  Anterior cervical discectomy and fusion, intraoperative examination EXAM: CERVICAL SPINE - 2-3 VIEW; DG C-ARM 1-60 MIN COMPARISON:  MRI 12/19/2020 FINDINGS: Two fluoroscopic intraoperative cross-table lateral radiographs of the cervical spine are presented postprocedurally. These images demonstrate, initially, a metallic probe overlying the anterior  intervertebral disc space of C3-4. Subsequent images demonstrate anterior cervical discectomy and fusion with instrumentation at C3-4. A cylindrical metallic density overlies the posterior elements of C3 and C4, likely an object overlying the patient. No unexpected fracture or listhesis of the visualized cervical spine on this limited examination. FLUOROSCOPY TIME:  Images: 2 Time: 1.26 minutes Dose: 95 mGy IMPRESSION: Intraoperative radiographs as described above. Electronically Signed   By: Fidela Salisbury MD   On: 12/25/2020 16:50   DG C-Arm 1-60 Min  Result Date: 12/25/2020 CLINICAL DATA:  Anterior cervical discectomy and fusion, intraoperative examination EXAM: CERVICAL SPINE - 2-3 VIEW; DG C-ARM 1-60 MIN COMPARISON:  MRI 12/19/2020 FINDINGS: Two fluoroscopic intraoperative cross-table lateral radiographs of the cervical spine are presented postprocedurally. These images demonstrate, initially, a metallic probe overlying the anterior intervertebral disc space of C3-4. Subsequent images demonstrate anterior cervical discectomy and fusion with instrumentation at C3-4. A cylindrical metallic density overlies the posterior elements of C3 and C4, likely an object overlying the patient. No unexpected fracture  or listhesis of the visualized cervical spine on this limited examination. FLUOROSCOPY TIME:  Images: 2 Time: 1.26 minutes Dose: 95 mGy IMPRESSION: Intraoperative radiographs as described above. Electronically Signed   By: Fidela Salisbury MD   On: 12/25/2020 16:50        Scheduled Meds: . vitamin C  500 mg Oral Daily  . Chlorhexidine Gluconate Cloth  6 each Topical Daily  . feeding supplement  237 mL Oral BID BM  . gabapentin  600 mg Oral TID  . glipiZIDE  20 mg Oral Daily  . insulin aspart  0-15 Units Subcutaneous TID WC  . insulin aspart  0-5 Units Subcutaneous QHS  . insulin aspart  5 Units Subcutaneous TID WC  . insulin detemir  40 Units Subcutaneous Daily  . multivitamin with minerals  1  tablet Oral Daily  . QUEtiapine  400 mg Oral BID  . rosuvastatin  40 mg Oral Daily  . senna-docusate  1 tablet Oral BID  . sodium chloride flush  3 mL Intravenous Q12H   Continuous Infusions:   LOS: 8 days    Time spent: 25 mins,More than 50% of that time was spent in counseling and/or coordination of care.      Shelly Coss, MD [facility] P3/24/2022, 7:47 AM

## 2020-12-28 ENCOUNTER — Encounter (HOSPITAL_COMMUNITY): Payer: Self-pay | Admitting: Physical Medicine and Rehabilitation

## 2020-12-28 ENCOUNTER — Inpatient Hospital Stay (HOSPITAL_COMMUNITY)
Admission: RE | Admit: 2020-12-28 | Discharge: 2021-01-03 | DRG: 559 | Disposition: A | Discharge status: Home / Self Care | Payer: 59 | Source: Intra-hospital | Attending: Physical Medicine and Rehabilitation | Admitting: Physical Medicine and Rehabilitation

## 2020-12-28 ENCOUNTER — Encounter (HOSPITAL_COMMUNITY): Payer: Self-pay | Admitting: Internal Medicine

## 2020-12-28 DIAGNOSIS — M5000 Cervical disc disorder with myelopathy, unspecified cervical region: Secondary | ICD-10-CM

## 2020-12-28 DIAGNOSIS — F203 Undifferentiated schizophrenia: Secondary | ICD-10-CM | POA: Diagnosis not present

## 2020-12-28 DIAGNOSIS — F209 Schizophrenia, unspecified: Secondary | ICD-10-CM | POA: Diagnosis present

## 2020-12-28 DIAGNOSIS — E1165 Type 2 diabetes mellitus with hyperglycemia: Secondary | ICD-10-CM | POA: Diagnosis present

## 2020-12-28 DIAGNOSIS — I69351 Hemiplegia and hemiparesis following cerebral infarction affecting right dominant side: Secondary | ICD-10-CM

## 2020-12-28 DIAGNOSIS — I69328 Other speech and language deficits following cerebral infarction: Secondary | ICD-10-CM

## 2020-12-28 DIAGNOSIS — F3130 Bipolar disorder, current episode depressed, mild or moderate severity, unspecified: Secondary | ICD-10-CM

## 2020-12-28 DIAGNOSIS — G959 Disease of spinal cord, unspecified: Secondary | ICD-10-CM

## 2020-12-28 DIAGNOSIS — Z4789 Encounter for other orthopedic aftercare: Secondary | ICD-10-CM | POA: Diagnosis present

## 2020-12-28 DIAGNOSIS — R32 Unspecified urinary incontinence: Secondary | ICD-10-CM

## 2020-12-28 DIAGNOSIS — D72829 Elevated white blood cell count, unspecified: Secondary | ICD-10-CM | POA: Diagnosis present

## 2020-12-28 DIAGNOSIS — M21371 Foot drop, right foot: Secondary | ICD-10-CM | POA: Diagnosis present

## 2020-12-28 DIAGNOSIS — I639 Cerebral infarction, unspecified: Secondary | ICD-10-CM

## 2020-12-28 DIAGNOSIS — E1143 Type 2 diabetes mellitus with diabetic autonomic (poly)neuropathy: Secondary | ICD-10-CM

## 2020-12-28 DIAGNOSIS — K5901 Slow transit constipation: Secondary | ICD-10-CM | POA: Diagnosis present

## 2020-12-28 DIAGNOSIS — Z90710 Acquired absence of both cervix and uterus: Secondary | ICD-10-CM

## 2020-12-28 DIAGNOSIS — G8918 Other acute postprocedural pain: Secondary | ICD-10-CM

## 2020-12-28 DIAGNOSIS — F17210 Nicotine dependence, cigarettes, uncomplicated: Secondary | ICD-10-CM | POA: Diagnosis present

## 2020-12-28 DIAGNOSIS — Z794 Long term (current) use of insulin: Secondary | ICD-10-CM | POA: Diagnosis not present

## 2020-12-28 DIAGNOSIS — R339 Retention of urine, unspecified: Secondary | ICD-10-CM | POA: Diagnosis not present

## 2020-12-28 DIAGNOSIS — I10 Essential (primary) hypertension: Secondary | ICD-10-CM | POA: Diagnosis present

## 2020-12-28 DIAGNOSIS — D62 Acute posthemorrhagic anemia: Secondary | ICD-10-CM

## 2020-12-28 DIAGNOSIS — G8250 Quadriplegia, unspecified: Secondary | ICD-10-CM | POA: Diagnosis present

## 2020-12-28 DIAGNOSIS — F3177 Bipolar disorder, in partial remission, most recent episode mixed: Secondary | ICD-10-CM

## 2020-12-28 DIAGNOSIS — R531 Weakness: Secondary | ICD-10-CM

## 2020-12-28 HISTORY — DX: Disease of spinal cord, unspecified: G95.9

## 2020-12-28 LAB — GLUCOSE, CAPILLARY
Glucose-Capillary: 318 mg/dL — ABNORMAL HIGH (ref 70–99)
Glucose-Capillary: 172 mg/dL — ABNORMAL HIGH (ref 70–99)
Glucose-Capillary: 195 mg/dL — ABNORMAL HIGH (ref 70–99)
Glucose-Capillary: 182 mg/dL — ABNORMAL HIGH (ref 70–99)

## 2020-12-28 MED ORDER — INSULIN ASPART 100 UNIT/ML ~~LOC~~ SOLN: 0.0000 [IU] | Freq: Every day | SUBCUTANEOUS | 11 refills | Status: DC

## 2020-12-28 MED ORDER — PHENOL 1.4 % MT LIQD: 1.0000 | OROMUCOSAL | Status: DC | PRN

## 2020-12-28 MED ORDER — GUAIFENESIN-DM 100-10 MG/5ML PO SYRP
5.0000 mL | ORAL_SOLUTION | Freq: Four times a day (QID) | ORAL | Status: DC | PRN
  Filled 2020-12-28 (×2): qty 10

## 2020-12-28 MED ORDER — ROSUVASTATIN CALCIUM 20 MG PO TABS
40.0000 mg | ORAL_TABLET | Freq: Every day | ORAL | Status: DC
  Administered 2020-12-29 – 2021-01-03 (×6): 40 mg via ORAL
  Filled 2020-12-28 (×6): qty 2

## 2020-12-28 MED ORDER — MAGNESIUM HYDROXIDE 400 MG/5ML PO SUSP
30.0000 mL | Freq: Once | ORAL | Status: DC
  Filled 2020-12-28: qty 30

## 2020-12-28 MED ORDER — TRAZODONE HCL 50 MG PO TABS
25.0000 mg | ORAL_TABLET | Freq: Every evening | ORAL | Status: DC | PRN
  Administered 2020-12-28 – 2020-12-29 (×2): 50 mg via ORAL
  Filled 2020-12-28 (×2): qty 1

## 2020-12-28 MED ORDER — INSULIN ASPART 100 UNIT/ML ~~LOC~~ SOLN: 0.0000 [IU] | Freq: Three times a day (TID) | SUBCUTANEOUS | 11 refills | Status: DC

## 2020-12-28 MED ORDER — INSULIN DETEMIR 100 UNIT/ML ~~LOC~~ SOLN
40.0000 [IU] | Freq: Every day | SUBCUTANEOUS | Status: DC
  Administered 2020-12-29 – 2021-01-03 (×5): 40 [IU] via SUBCUTANEOUS
  Filled 2020-12-28 (×7): qty 0.4

## 2020-12-28 MED ORDER — FLEET ENEMA 7-19 GM/118ML RE ENEM: 1.0000 | ENEMA | Freq: Once | RECTAL | Status: DC | PRN

## 2020-12-28 MED ORDER — TRAMADOL HCL 50 MG PO TABS
50.0000 mg | ORAL_TABLET | Freq: Four times a day (QID) | ORAL | Status: DC | PRN
  Administered 2020-12-31 – 2021-01-03 (×4): 50 mg via ORAL
  Filled 2020-12-28 (×4): qty 1

## 2020-12-28 MED ORDER — INSULIN ASPART 100 UNIT/ML ~~LOC~~ SOLN
8.0000 [IU] | Freq: Three times a day (TID) | SUBCUTANEOUS | Status: DC
  Administered 2020-12-28 (×3): 8 [IU] via SUBCUTANEOUS

## 2020-12-28 MED ORDER — GLIPIZIDE ER 10 MG PO TB24
20.0000 mg | ORAL_TABLET | Freq: Every day | ORAL | Status: DC
  Administered 2020-12-29 – 2021-01-03 (×6): 20 mg via ORAL
  Filled 2020-12-28 (×7): qty 2

## 2020-12-28 MED ORDER — OXYCODONE HCL 10 MG PO TABS: 10.0000 mg | ORAL_TABLET | ORAL | 0 refills | Status: DC | PRN

## 2020-12-28 MED ORDER — PROCHLORPERAZINE MALEATE 5 MG PO TABS
5.0000 mg | ORAL_TABLET | Freq: Four times a day (QID) | ORAL | Status: DC | PRN
  Administered 2020-12-31 – 2021-01-02 (×2): 10 mg via ORAL
  Filled 2020-12-28 (×3): qty 2

## 2020-12-28 MED ORDER — SENNOSIDES-DOCUSATE SODIUM 8.6-50 MG PO TABS: 1.0000 | ORAL_TABLET | Freq: Two times a day (BID) | ORAL | Status: DC

## 2020-12-28 MED ORDER — PHENOL 1.4 % MT LIQD
1.0000 | OROMUCOSAL | Status: DC | PRN
  Filled 2020-12-28: qty 177

## 2020-12-28 MED ORDER — ADULT MULTIVITAMIN W/MINERALS CH
1.0000 | ORAL_TABLET | Freq: Every day | ORAL | Status: DC
  Administered 2020-12-29 – 2021-01-03 (×6): 1 via ORAL
  Filled 2020-12-28 (×6): qty 1

## 2020-12-28 MED ORDER — ACETAMINOPHEN 325 MG PO TABS
325.0000 mg | ORAL_TABLET | ORAL | Status: DC | PRN
  Administered 2020-12-30: 650 mg via ORAL
  Filled 2020-12-28: qty 2

## 2020-12-28 MED ORDER — QUETIAPINE FUMARATE 400 MG PO TABS: 400.0000 mg | ORAL_TABLET | Freq: Two times a day (BID) | ORAL | Status: DC

## 2020-12-28 MED ORDER — GABAPENTIN 300 MG PO CAPS
600.0000 mg | ORAL_CAPSULE | Freq: Three times a day (TID) | ORAL | Status: DC
  Filled 2020-12-28 (×2): qty 2

## 2020-12-28 MED ORDER — QUETIAPINE FUMARATE 400 MG PO TABS
400.0000 mg | ORAL_TABLET | Freq: Two times a day (BID) | ORAL | Status: DC
  Administered 2020-12-28 – 2021-01-03 (×12): 400 mg via ORAL
  Filled 2020-12-28 (×2): qty 8
  Filled 2020-12-28 (×3): qty 1
  Filled 2020-12-28: qty 8
  Filled 2020-12-28 (×2): qty 1
  Filled 2020-12-28: qty 8
  Filled 2020-12-28: qty 2
  Filled 2020-12-28: qty 8
  Filled 2020-12-28: qty 1
  Filled 2020-12-28: qty 8
  Filled 2020-12-28: qty 1

## 2020-12-28 MED ORDER — INSULIN ASPART 100 UNIT/ML ~~LOC~~ SOLN: 8.0000 [IU] | Freq: Three times a day (TID) | SUBCUTANEOUS | 11 refills | Status: DC

## 2020-12-28 MED ORDER — ASCORBIC ACID 500 MG PO TABS: 500.0000 mg | ORAL_TABLET | Freq: Every day | ORAL | Status: DC

## 2020-12-28 MED ORDER — INSULIN ASPART 100 UNIT/ML ~~LOC~~ SOLN
8.0000 [IU] | Freq: Three times a day (TID) | SUBCUTANEOUS | Status: DC
  Administered 2020-12-29 – 2021-01-02 (×11): 8 [IU] via SUBCUTANEOUS

## 2020-12-28 MED ORDER — PROCHLORPERAZINE EDISYLATE 10 MG/2ML IJ SOLN
5.0000 mg | Freq: Four times a day (QID) | INTRAMUSCULAR | Status: DC | PRN
  Filled 2020-12-28: qty 2

## 2020-12-28 MED ORDER — BISACODYL 10 MG RE SUPP: 10.0000 mg | Freq: Every day | RECTAL | Status: DC | PRN

## 2020-12-28 MED ORDER — PROCHLORPERAZINE 25 MG RE SUPP
12.5000 mg | Freq: Four times a day (QID) | RECTAL | Status: DC | PRN
  Filled 2020-12-28: qty 1

## 2020-12-28 MED ORDER — CLOPIDOGREL BISULFATE 75 MG PO TABS: 75.0000 mg | ORAL_TABLET | Freq: Every day | ORAL | 3 refills | Status: DC

## 2020-12-28 MED ORDER — INSULIN ASPART 100 UNIT/ML ~~LOC~~ SOLN: 0.0000 [IU] | Freq: Every day | SUBCUTANEOUS | Status: DC

## 2020-12-28 MED ORDER — DIPHENHYDRAMINE HCL 12.5 MG/5ML PO ELIX: 12.5000 mg | ORAL_SOLUTION | Freq: Four times a day (QID) | ORAL | Status: DC | PRN

## 2020-12-28 MED ORDER — ALUM & MAG HYDROXIDE-SIMETH 200-200-20 MG/5ML PO SUSP: 30.0000 mL | ORAL | Status: DC | PRN

## 2020-12-28 MED ORDER — METHOCARBAMOL 500 MG PO TABS
500.0000 mg | ORAL_TABLET | Freq: Four times a day (QID) | ORAL | Status: DC | PRN
Start: 2020-12-28 — End: 2021-01-03

## 2020-12-28 MED ORDER — ASCORBIC ACID 500 MG PO TABS
500.0000 mg | ORAL_TABLET | Freq: Every day | ORAL | Status: DC
  Administered 2020-12-29 – 2021-01-01 (×4): 500 mg via ORAL
  Filled 2020-12-28 (×4): qty 1

## 2020-12-28 MED ORDER — ASPIRIN EC 81 MG PO TBEC
81.0000 mg | DELAYED_RELEASE_TABLET | Freq: Every day | ORAL | Status: DC
  Administered 2020-12-29 – 2021-01-03 (×6): 81 mg via ORAL
  Filled 2020-12-28 (×6): qty 1

## 2020-12-28 MED ORDER — ENOXAPARIN SODIUM 40 MG/0.4ML ~~LOC~~ SOLN
40.0000 mg | SUBCUTANEOUS | Status: DC
  Administered 2020-12-29 – 2021-01-02 (×5): 40 mg via SUBCUTANEOUS
  Filled 2020-12-28 (×6): qty 0.4

## 2020-12-28 MED ORDER — SENNOSIDES-DOCUSATE SODIUM 8.6-50 MG PO TABS
1.0000 | ORAL_TABLET | Freq: Two times a day (BID) | ORAL | Status: DC
  Administered 2020-12-29 – 2020-12-31 (×4): 1 via ORAL
  Filled 2020-12-28 (×6): qty 1

## 2020-12-28 MED ORDER — OXYCODONE HCL 5 MG PO TABS
10.0000 mg | ORAL_TABLET | ORAL | Status: DC | PRN
  Administered 2020-12-28 – 2021-01-03 (×13): 10 mg via ORAL
  Filled 2020-12-28 (×13): qty 2

## 2020-12-28 MED ORDER — CLOPIDOGREL BISULFATE 75 MG PO TABS
75.0000 mg | ORAL_TABLET | Freq: Every day | ORAL | Status: DC
  Administered 2021-01-01 – 2021-01-03 (×3): 75 mg via ORAL
  Filled 2020-12-28 (×3): qty 1

## 2020-12-28 MED ORDER — ENSURE MAX PROTEIN PO LIQD
11.0000 [oz_av] | Freq: Two times a day (BID) | ORAL | Status: DC
  Administered 2020-12-28 – 2021-01-02 (×10): 11 [oz_av] via ORAL
  Filled 2020-12-28 (×10): qty 330

## 2020-12-28 MED ORDER — INSULIN ASPART 100 UNIT/ML ~~LOC~~ SOLN
0.0000 [IU] | Freq: Three times a day (TID) | SUBCUTANEOUS | Status: DC
  Administered 2020-12-29: 2 [IU] via SUBCUTANEOUS
  Administered 2020-12-29: 5 [IU] via SUBCUTANEOUS
  Administered 2020-12-30: 3 [IU] via SUBCUTANEOUS
  Administered 2020-12-30: 2 [IU] via SUBCUTANEOUS
  Administered 2020-12-31: 3 [IU] via SUBCUTANEOUS
  Administered 2021-01-01: 8 [IU] via SUBCUTANEOUS
  Administered 2021-01-01: 2 [IU] via SUBCUTANEOUS
  Administered 2021-01-02: 8 [IU] via SUBCUTANEOUS
  Administered 2021-01-02: 5 [IU] via SUBCUTANEOUS

## 2020-12-28 MED ORDER — POLYETHYLENE GLYCOL 3350 17 G PO PACK: 17.0000 g | PACK | Freq: Every day | ORAL | Status: DC | PRN

## 2020-12-28 NOTE — Progress Notes (Signed)
PMR Admission Coordinator Pre-Admission Assessment  Patient: Ashley [name] is an 59 y.o., female MRN: [id number] DOB: 03/23/1961 Height: 5' 7" (170.2 cm) Weight: 65.1 kg  Insurance Information HMO:    PPO:      PCP:      IPA:      80/20:      OTHER:  PRIMARY: [employer]      Policy#: [id number]      Subscriber: pt CM Name: faxed approval      Phone#:      Fax#: [phone] Pre-Cert#: [id number] auth for CIR via fax with updates due on 3/31 to fax listed above    Employer:  Benefits:  Phone #: [phone]     Name:  Eff. Date: 10/07/19     Deduct: $0      Out of Pocket Max: $8700 ($0 met)      Life Max: n/a CIR: $3000/day for days 1 and 2, then 50% until OOPM met       SNF: 50% Outpatient:      Co-Pay: $100/visit Home Health: 50%      Co-Ins: 50% DME: 50%     Co-Ins: 50% Providers:  SECONDARY:       Policy#:      Phone#:   Financial Counselor:       Phone#:   The "Data Collection Information Summary" for patients in Inpatient Rehabilitation Facilities with attached "Privacy Act Statement-Health Care Records" was provided and verbally reviewed with: N/A  Emergency Contact Information Contact Information    Name Relation Home Work Mobile   Reed,Gwendolyn Sister [phone]        Current Medical History  Patient Admitting Diagnosis: cervical myelopathy, s/p ACDF C3-4  History of Present Illness: pt is a 59 y/o female with PMH of DM2, bipolar, CVA (1 month prior to admit), and tobacco use who was admitted to [facility] on 12/18/2020 with progressive LE weakness.  Pt was seen by neurology on 3/14 and noted to have quadiparesis with R>L weakness with scheduled MRI of C-spine as well as echo, CTA head/neck.  On the day of admission, pt presented to the ED with worsening weakness.  MRI cervical spine showed large R paracentral disc extrusion at C3-4 with severe spinal stenosis, cord compression, and signal abnormality compatible with spondylotic myelopathy, and moderate stenosis at  C4-5, mild stenosis at C2-3 and C5-6.  Neurosurgery consulted and recommended immediate surgery once aspirin/plavix wash out completed.  Pt underwent ACDF C3-4 on 3/22 per Dr. Ostergard.  Post op course course pain control and insulin management.  Therapy evaluations were completed and pt was recommended for CIR.     Patient's medical record from [facility] has been reviewed by the rehabilitation admission coordinator and physician.  Past Medical History  Past Medical History:  Diagnosis Date  . Anxiety   . Arthritis   . Bipolar 1 disorder (HCC)   . Depression   . Diabetes mellitus without complication (HCC)    Type II  . GERD (gastroesophageal reflux disease)   . Heart murmur    "nothing to worry about"  . HTN (hypertension)    not on medication  . Stroke (HCC) 11/2020    Family History   family history includes Diabetes in her brother; Diabetes Mellitus II in her father; Healthy in her mother.  Prior Rehab/Hospitalizations Has the patient had prior rehab or hospitalizations prior to admission? Yes  Has the patient had major surgery during 100 days prior to admission? Yes     Current Medications  Current Facility-Administered Medications:  .  acetaminophen (TYLENOL) tablet 650 mg, 650 mg, Oral, Q6H PRN, 650 mg at 12/27/20 0857 **OR** acetaminophen (TYLENOL) suppository 650 mg, 650 mg, Rectal, Q6H PRN, Ostergard, Thomas A, MD .  ascorbic acid (VITAMIN C) tablet 500 mg, 500 mg, Oral, Daily, Ostergard, Thomas A, MD, 500 mg at 12/28/20 0910 .  Chlorhexidine Gluconate Cloth 2 % PADS 6 each, 6 each, Topical, Daily, Adhikari, Amrit, MD, 6 each at 12/28/20 0914 .  enoxaparin (LOVENOX) injection 40 mg, 40 mg, Subcutaneous, Q24H, Chen, Lydia D, RPH, 40 mg at 12/28/20 0909 .  feeding supplement (ENSURE ENLIVE / ENSURE PLUS) liquid 237 mL, 237 mL, Oral, BID BM, Ostergard, Thomas A, MD, 237 mL at 12/28/20 0909 .  gabapentin (NEURONTIN) capsule 600 mg, 600 mg, Oral, TID, Ostergard,  Thomas A, MD, 600 mg at 12/27/20 2208 .  glipiZIDE (GLUCOTROL XL) 24 hr tablet 20 mg, 20 mg, Oral, Daily, Ostergard, Thomas A, MD, 20 mg at 12/28/20 0910 .  hydrALAZINE (APRESOLINE) injection 10 mg, 10 mg, Intravenous, Q6H PRN, Adhikari, Amrit, MD .  HYDROmorphone (DILAUDID) injection 0.5 mg, 0.5 mg, Intravenous, Q4H PRN, Ostergard, Thomas A, MD .  insulin aspart (novoLOG) injection 0-15 Units, 0-15 Units, Subcutaneous, TID WC, Adhikari, Amrit, MD, 11 Units at 12/28/20 0910 .  insulin aspart (novoLOG) injection 0-5 Units, 0-5 Units, Subcutaneous, QHS, Adhikari, Amrit, MD, 3 Units at 12/26/20 2152 .  insulin aspart (novoLOG) injection 8 Units, 8 Units, Subcutaneous, TID WC, Adhikari, Amrit, MD, 8 Units at 12/28/20 0921 .  insulin detemir (LEVEMIR) injection 40 Units, 40 Units, Subcutaneous, Daily, Ostergard, Thomas A, MD, 40 Units at 12/28/20 0911 .  multivitamin with minerals tablet 1 tablet, 1 tablet, Oral, Daily, Ostergard, Thomas A, MD, 1 tablet at 12/28/20 0910 .  ondansetron (ZOFRAN) tablet 4 mg, 4 mg, Oral, Q6H PRN **OR** ondansetron (ZOFRAN) injection 4 mg, 4 mg, Intravenous, Q6H PRN, Ostergard, Thomas A, MD .  oxyCODONE (Oxy IR/ROXICODONE) immediate release tablet 10 mg, 10 mg, Oral, Q4H PRN, Ostergard, Thomas A, MD, 10 mg at 12/28/20 0909 .  oxyCODONE (Oxy IR/ROXICODONE) immediate release tablet 5 mg, 5 mg, Oral, Q4H PRN, Ostergard, Thomas A, MD .  QUEtiapine (SEROQUEL) tablet 400 mg, 400 mg, Oral, BID, Ostergard, Thomas A, MD, 400 mg at 12/28/20 0909 .  rosuvastatin (CRESTOR) tablet 40 mg, 40 mg, Oral, Daily, Ostergard, Thomas A, MD, 40 mg at 12/28/20 0910 .  senna-docusate (Senokot-S) tablet 1 tablet, 1 tablet, Oral, BID, Ostergard, Thomas A, MD, 1 tablet at 12/28/20 0910 .  sodium chloride flush (NS) 0.9 % injection 3 mL, 3 mL, Intravenous, Q12H, Ostergard, Thomas A, MD, 3 mL at 12/27/20 2200  Patients Current Diet:  Diet Order            Diet - low sodium heart healthy            Diet regular Room service appropriate? Yes; Fluid consistency: Thin  Diet effective now                 Precautions / Restrictions Precautions Precautions: Fall,Cervical Precaution Booklet Issued: No Precaution Comments: Reviewed cervical precautions with pt Restrictions Weight Bearing Restrictions: No   Has the patient had 2 or more falls or a fall with injury in the past year? Yes  Prior Activity Level Limited Community (1-2x/wk): independent prior to admission, driving, running errands a few times/week, no DME at baseline  Prior Functional Level Self Care: Did the patient need help   bathing, dressing, using the toilet or eating? Independent  Indoor Mobility: Did the patient need assistance with walking from room to room (with or without device)? Independent  Stairs: Did the patient need assistance with internal or external stairs (with or without device)? Independent  Functional Cognition: Did the patient need help planning regular tasks such as shopping or remembering to take medications? Independent  Home Assistive Devices / Equipment Home Assistive Devices/Equipment: Walker (specify type),Cane (specify quad or straight) Home Equipment: Walker - 2 wheels,Bedside commode  Prior Device Use: Indicate devices/aids used by the patient prior to current illness, exacerbation or injury? Manual wheelchair  Current Functional Level Cognition  Overall Cognitive Status: No family/caregiver present to determine baseline cognitive functioning Orientation Level: Oriented X4 General Comments: Pt with decreased awareness of current deficits and safety.    Extremity Assessment (includes Sensation/Coordination)  Upper Extremity Assessment: Generalized weakness,RUE deficits/detail RUE Deficits / Details: Significant RUE weakness. weak grip strength and only able to flex R shoulder to ~60 degrees. RUE Coordination: decreased fine motor  Lower Extremity Assessment: Defer to PT  evaluation RLE Deficits / Details: Grossly 3+/5 throughout RLE.    ADLs  Overall ADL's : Needs assistance/impaired Eating/Feeding: Set up,Independent,Sitting Grooming: Wash/dry face,Wash/dry hands,Standing,Min guard Grooming Details (indicate cue type and reason): pt unsteady Upper Body Bathing: Min guard,Sitting Lower Body Bathing: Minimal assistance,Sitting/lateral leans,Sit to/from stand Upper Body Dressing : Min guard,Sitting Lower Body Dressing: Minimal assistance,Sitting/lateral leans,Sit to/from stand Toilet Transfer: Ambulation,Comfort height toilet,Grab bars,Cueing for safety,Minimal assistance,Min guard,RW Toileting- Clothing Manipulation and Hygiene: Min guard,Sit to/from stand Tub/ Shower Transfer: Minimal assistance,Ambulation,3 in 1,Grab bars,Cueing for safety Functional mobility during ADLs: Cueing for safety,Cueing for sequencing,Minimal assistance,Min guard,Rolling walker    Mobility  Overal bed mobility: Needs Assistance Bed Mobility: Rolling,Sidelying to Sit Rolling: Min assist Sidelying to sit: Mod assist Supine to sit: Min guard Sit to supine: Supervision Sit to sidelying: Min assist General bed mobility comments: Min assistance to advance LEs and elevate trunk into sitting.  Min assistance to lift B LEs against gravity for back to bed.  slight twisting noted this session when going back to bed.  Continued cues for logrolling to avoid twisting.    Transfers  Overall transfer level: Needs assistance Equipment used: None Transfers: Sit to/from Stand Sit to Stand: Min assist General transfer comment: Min assistance with cues for hand placement to and from seated surface.  pt reaching to pull on RW into standing.    Ambulation / Gait / Stairs / Wheelchair Mobility  Ambulation/Gait Ambulation/Gait assistance: Min assist Gait Distance (Feet): 40 Feet (x2 ( seated rest break between trials )) Assistive device: Rolling walker (2 wheeled) Gait Pattern/deviations:  Step-through pattern,Decreased stride length,Decreased weight shift to left,Trendelenburg General Gait Details: Pt continues with trendelenberg gt on R.  Poor foot clearance on R side.  Pt did use RW and able to maintain grip on R side this session with assistance.  Continues to require min assistance to turn and back with RW. Gait velocity: Decreased    Posture / Balance Balance Overall balance assessment: Needs assistance Sitting-balance support: No upper extremity supported,Feet supported Sitting balance-Leahy Scale: Fair Standing balance support: No upper extremity supported,During functional activity Standing balance-Leahy Scale: Poor Standing balance comment: Reliant on external support    Special needs/care consideration Skin surgical incision to ant neck, Diabetic management yes and Special service needs may need bowel/bladder program?   Previous Home Environment (from acute therapy documentation) Living Arrangements: Children Available Help at Discharge: Family Type of Home:   Apartment Home Layout: One level Home Access: Stairs to enter Entrance Stairs-Rails: Right Entrance Stairs-Number of Steps: 12 Bathroom Shower/Tub: Tub/shower unit Bathroom Toilet: Handicapped height Home Care Services: No  Discharge Living Setting Plans for Discharge Living Setting: Patient's home,Alone Type of Home at Discharge: Apartment Discharge Home Layout: One level Discharge Home Access: Stairs to enter Entrance Stairs-Rails: Right Entrance Stairs-Number of Steps: full flight Discharge Bathroom Shower/Tub: Tub/shower unit Discharge Bathroom Toilet: Handicapped height Discharge Bathroom Accessibility: Yes How Accessible: Accessible via walker Does the patient have any problems obtaining your medications?: No  Social/Family/Support Systems Anticipated Caregiver: daughters (Amelia and [name]) Anticipated Caregiver's Contact Information: Amelia [phone]; [name]  [phone] Ability/Limitations of Caregiver: no physical limitations, Amelia can provide 24/7 if needed Caregiver Availability: 24/7 Discharge Plan Discussed with Primary Caregiver: Yes Is Caregiver In Agreement with Plan?: Yes Does Caregiver/Family have Issues with Lodging/Transportation while Pt is in Rehab?: No  Goals Patient/Family Goal for Rehab: PT/OT mod I, SLP n/a Expected length of stay: 14-16 days Additional Information: daughters planning to provide whatever assist is needed at discharge, Amelia does not work during the day Pt/Family Agrees to Admission and willing to participate: Yes Program Orientation Provided & Reviewed with Pt/Caregiver Including Roles  & Responsibilities: Yes Additional Information Needs: n/a  Barriers to Discharge: Insurance for SNF coverage  Decrease burden of Care through IP rehab admission: n/a Possible need for SNF placement upon discharge: Not anticipated.   Patient Condition: I have reviewed medical records from [facility], spoken with CM, and patient and daughters. I met with patient at the bedside for inpatient rehabilitation assessment.  Patient will benefit from ongoing PT and OT, can actively participate in 3 hours of therapy a day 5 days of the week, and can make measurable gains during the admission.  Patient will also benefit from the coordinated team approach during an Inpatient Acute Rehabilitation admission.  The patient will receive intensive therapy as well as Rehabilitation physician, nursing, social worker, and care management interventions.  Due to bladder management, bowel management, safety, skin/wound care, disease management, medication administration, pain management and patient education the patient requires 24 hour a day rehabilitation nursing.  The patient is currently min to mod with mobility and basic ADLs.  Discharge setting and therapy post discharge at home with home health is anticipated.  Patient has agreed to  participate in the Acute Inpatient Rehabilitation Program and will admit today.  Preadmission Screen Completed By:  Caitlin E Warren, PT, DPT 12/28/2020 11:49 AM ______________________________________________________________________   Discussed status with Dr. Patel on 12/28/20  at 11:49 AM  and received approval for admission today.  Admission Coordinator:  Caitlin E Warren, PT, DPT time 11:49 AM /Date 12/28/20    Assessment/Plan: Diagnosis: cervical myelopathy, s/p ACDF C3-4  1. Does the need for close, 24 hr/day Medical supervision in concert with the patient's rehab needs make it unreasonable for this patient to be served in a less intensive setting? Yes  2. Co-Morbidities requiring supervision/potential complications: DM2, bipolar, CVA (1 month prior to admit), and tobacco use 3. Due to bladder management, bowel management, safety, skin/wound care, disease management, medication administration, pain management and patient education, does the patient require 24 hr/day rehab nursing? Yes 4. Does the patient require coordinated care of a physician, rehab nurse, PT, OT, and SLP to address physical and functional deficits in the context of the above medical diagnosis(es)? Yes Addressing deficits in the following areas: balance, endurance, locomotion, strength, transferring, bowel/bladder control, bathing, dressing, toileting and psychosocial   support 5. Can the patient actively participate in an intensive therapy program of at least 3 hrs of therapy 5 days a week? Yes 6. The potential for patient to make measurable gains while on inpatient rehab is excellent 7. Anticipated functional outcomes upon discharge from inpatient rehab: supervision and min assist PT, supervision and min assist OT, modified independent SLP 8. Estimated rehab length of stay to reach the above functional goals is: 16-19 days. 9. Anticipated discharge destination: Home 10. Overall Rehab/Functional Prognosis: excellent  MD  Signature: Ankit Patel, MD, ABPMR 

## 2020-12-28 NOTE — Progress Notes (Signed)
Patient arrived from 5N, assigned to 4MW13. Patient appears oriented and denies pain.

## 2020-12-28 NOTE — Progress Notes (Signed)
Physical Therapy Treatment Patient Details Name: Ashley [name] MRN: [id number] DOB: Jul 20, 1961 Today's Date: 12/28/2020    History of Present Illness Pt is a 60 y/o female presenting to the ED after falls and weakness on R side that has been going on for ~3 weeks. Per notes, pt had MRI outside of hospital that showed L parietal CVA. 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 Pt was then admitted to hospital. PMH includes DM, HTN, and bipolar disorder. Pt is now s/p C3-4 ACDF on 3/22.    PT Comments    Pt supine in bed on arrival this session.  Performed mobility and gt training.  Continues to present with R sided weakness from CVA and hand over hand assistance to grip and turn RW.  Pt continues to benefit from skilled rehab in post acute setting and has orders to d/c today.  Pt remains to require cues for safety to maintain spinal/cervical precautions.  Educated daughters during session on precautions.    Follow Up Recommendations  CIR     Equipment Recommendations  Other (comment) (TBD)    Recommendations for Other Services       Precautions / Restrictions Precautions Precautions: Fall;Cervical Precaution Booklet Issued: No Precaution Comments: Reviewed cervical precautions with pt Restrictions Weight Bearing Restrictions: No    Mobility  Bed Mobility Overal bed mobility: Needs Assistance Bed Mobility: Rolling;Sidelying to Sit Rolling: Min assist Sidelying to sit: Mod assist     Sit to sidelying: Min assist General bed mobility comments: Min assistance to advance LEs and elevate trunk into sitting.  Min assistance to lift B LEs against gravity for back to bed.  slight twisting noted this session when going back to bed.  Continued cues for logrolling to avoid twisting.    Transfers Overall transfer level: Needs assistance Equipment used: None Transfers: Sit to/from Stand Sit to Stand: Min assist          General transfer comment: Min assistance with cues for hand placement to and from seated surface.  pt reaching to pull on RW into standing.  Ambulation/Gait Ambulation/Gait assistance: Min assist Gait Distance (Feet): 40 Feet (x2 ( seated rest break between trials )) Assistive device: Rolling walker (2 wheeled) Gait Pattern/deviations: Step-through pattern;Decreased stride length;Decreased weight shift to left;Trendelenburg Gait velocity: Decreased   General Gait Details: Pt continues with trendelenberg gt on R.  Poor foot clearance on R side.  Pt did use RW and able to maintain grip on R side this session with assistance.  Continues to require min assistance to turn and back with RW.   Stairs             Wheelchair Mobility    Modified Rankin (Stroke Patients Only) Modified Rankin (Stroke Patients Only) Pre-Morbid Rankin Score: No symptoms Modified Rankin: Moderately severe disability     Balance Overall balance assessment: Needs assistance Sitting-balance support: No upper extremity supported;Feet supported Sitting balance-Leahy Scale: Fair       Standing balance-Leahy Scale: Poor                              Cognition   Behavior During Therapy: WFL for tasks assessed/performed Overall Cognitive Status: No family/caregiver present to determine baseline cognitive functioning  General Comments: Pt with decreased awareness of current deficits and safety.      Exercises Other Exercises Other Exercises: Hands clapsed and performed chest presses to encourage ROM in R arm Other Exercises: R LAQs x 10 reps in sitting.    General Comments        Pertinent Vitals/Pain Pain Assessment: Faces Faces Pain Scale: Hurts little more Pain Location: neck Pain Descriptors / Indicators: Discomfort Pain Intervention(s): Monitored during session;Repositioned    Home Living                      Prior  Function            PT Goals (current goals can now be found in the care plan section) Acute Rehab PT Goals Patient Stated Goal: To go back to bed and get a nap Potential to Achieve Goals: Good Progress towards PT goals: Progressing toward goals    Frequency    Min 5X/week      PT Plan Current plan remains appropriate    Co-evaluation              AM-PAC PT "6 Clicks" Mobility   Outcome Measure  Help needed turning from your back to your side while in a flat bed without using bedrails?: A Little Help needed moving from lying on your back to sitting on the side of a flat bed without using bedrails?: A Little Help needed moving to and from a bed to a chair (including a wheelchair)?: A Little Help needed standing up from a chair using your arms (e.g., wheelchair or bedside chair)?: A Little Help needed to walk in hospital room?: A Little Help needed climbing 3-5 steps with a railing? : A Lot 6 Click Score: 17    End of Session Equipment Utilized During Treatment: Gait belt Activity Tolerance: Patient tolerated treatment well Patient left: with call bell/phone within reach;in bed;with bed alarm set Nurse Communication: Mobility status PT Visit Diagnosis: Unsteadiness on feet (R26.81);Hemiplegia and hemiparesis Hemiplegia - Right/Left: Right Hemiplegia - dominant/non-dominant: Dominant Hemiplegia - caused by: Cerebral infarction     Time: 0034-9179 PT Time Calculation (min) (ACUTE ONLY): 22 min  Charges:  $Gait Training: 8-22 mins                     Erasmo Leventhal , PTA Acute Rehabilitation Services Pager [phone] Office [phone]     Johnross Nabozny [name] 12/28/2020, 11:44 AM

## 2020-12-28 NOTE — Progress Notes (Signed)
Neurosurgery Service Progress Note  Subjective: No acute events overnight, feeling good, up in a chair eating breakfast this morning, she's happy that her strength is continuing to improve fairly quickly  Objective: Vitals:   12/27/20 1506 12/27/20 2057 12/28/20 0518 12/28/20 0748  BP: 103/73 116/70 94/65 114/74  Pulse: 96 91 93 99  Resp: 14 18 18 16   Temp: 98.3 F (36.8 C) 98.3 F (36.8 C) 99.1 F (37.3 C) 97.7 F (36.5 C)  TempSrc: Oral  Oral Oral  SpO2: 97% 98% 95% 98%  Weight:      Height:        Physical Exam: Strength 4/5 in RUE / RLE, 4/5 to 4+ in LUE / LLE, no hoffman's Incision c/d/i  Assessment & Plan: 60 y.o. woman w/ recent stroke and large cervical disc herniation with myelopathy.  3/22 s/p 3-4 ACDF  -activity as tolerated, no rigid cervical collar needed -okay for DVT chemoprophylaxis 3/24, aspirin 3/25, plavix 3/29 -will see the patient on 3/28, Drs. Vertell Limber (3/26) and Kathyrn Sheriff (3/27) will be covering for me this weekend, call the neurosurgeon on call with any concerns or questions  Judith Part  12/28/20 8:18 AM

## 2020-12-28 NOTE — Discharge Summary (Signed)
Physician Discharge Summary  Ashley [name] NTZ:[id number] DOB: 10-01-1961 DOA: 12/18/2020  PCP: Elsie Stain, MD  Admit date: 12/18/2020 Discharge date: 12/28/2020  Admitted From: Home Disposition:  CIR  Discharge Condition:Stable CODE STATUS:FULL Diet recommendation: Heart Healthy  Brief/Interim Summary: Patient is a 60 year old female with history of insulin-dependent diabetes type 2, hyperlipidemia, bipolar disorder, tobacco abuse who was recently diagnosed with a stroke presented to the emergency department with complaints of progressive weakness.  Found to have severe C3-C4 cervical spinal stenosis with cord compression/cervical myelopathy due to large disc herniation.  She underwent C3-C4 ACDF on 12/25/2020.  PT/OT recommending CIR. medically stable for discharge today.  Following problems were addressed during her hospitalization:  Severe C3-C4 cervical stenosis with cord compression: Found to have cervical myelopathy due to herniated disc.  Neurosurgery following. She underwent C3-C4  ACDF on 12/25/2020 after DAPT washout. She was complaining of weakness in the right upper extremity,now slowly improving.  At baseline, she is ambulatory and uses walker/cane. Neurosurgery will follow her at CIR.  She needs to follow-up with neurosurgery as an outpatient after discharge.  Posterior left bladder lobe ischemic stroke: MRI done on 11/24/2020 showed small cortical left frontal ischemic stroke.  She was on aspirin and Plavix.  currently on hold as per  neurosurgery.  Aspirin started today, Plavix to be started on 3/29. Continue statin  Insulin-dependent diabetes type 2: Uncontrolled.Likely from noncompliance.  Hemoglobin A1c of 9.5 as per 2/22.  Continue home Levemir, sliding scale insulin.  Added NovoLog 8 units tid  Bipolar disorder: On Seroquel  Tobacco use: Smokes half to 1 pack a cigarette a day.  Advised for smoking cessation.  Declined nicotine patch.  UDS positive for  THC.  Constipation: Continue bowel regimen.  Hypertension: Transient hypertension due to pain, now resolved. no history of hypertension and does not take any medication at home.  Debility/deconditioning: Patient seen by PT and recommended CIR on discharge.    Discharge Diagnoses:  Principal Problem:   Cervical spinal cord compression (HCC) Active Problems:   Hyperlipidemia   History of stroke   Bipolar affective disorder in remission ([place])   Tobacco user   Type 2 diabetes mellitus with diabetic autonomic (poly)neuropathy ([place])   Malnutrition of moderate degree    Discharge Instructions  Discharge Instructions    Diet - low sodium heart healthy   Complete by: As directed    Discharge instructions   Complete by: As directed    1)Take prescribed medication as instructed 2)Follow up with neurosurgery as an outpatient after discharge from CIR.   Increase activity slowly   Complete by: As directed    No wound care   Complete by: As directed      Allergies as of 12/28/2020   No Known Allergies     Medication List    TAKE these medications   ascorbic acid 500 MG tablet Commonly known as: VITAMIN C Take 1 tablet (500 mg total) by mouth daily. Start taking on: December 29, 2020   aspirin EC 81 MG tablet Take 1 tablet (81 mg total) by mouth daily. Swallow whole.   clopidogrel 75 MG tablet Commonly known as: PLAVIX Take 1 tablet (75 mg total) by mouth daily. Start taking only on 3/29 Start taking on: January 01, 2021 What changed:   additional instructions  These instructions start on January 01, 2021. If you are unsure what to do until then, ask your doctor or other care provider.   gabapentin 300 MG capsule Commonly  known as: NEURONTIN Take 2 capsules (600 mg total) by mouth 3 (three) times daily.   glipiZIDE 10 MG 24 hr tablet Commonly known as: GLUCOTROL XL Take 2 tablets (20 mg total) by mouth daily.   insulin aspart 100 UNIT/ML injection Commonly known  as: novoLOG Inject 0-15 Units into the skin 3 (three) times daily with meals.   insulin aspart 100 UNIT/ML injection Commonly known as: novoLOG Inject 0-5 Units into the skin at bedtime.   insulin aspart 100 UNIT/ML injection Commonly known as: novoLOG Inject 8 Units into the skin 3 (three) times daily with meals.   insulin detemir 100 UNIT/ML FlexPen Commonly known as: LEVEMIR Inject 40 Units into the skin daily.   Oxycodone HCl 10 MG Tabs Take 1 tablet (10 mg total) by mouth every 4 (four) hours as needed for severe pain.   QUEtiapine 400 MG tablet Commonly known as: SEROQUEL Take 1 tablet (400 mg total) by mouth 2 (two) times daily.   rosuvastatin 40 MG tablet Commonly known as: Crestor One tablet daily What changed:   how much to take  how to take this  when to take this  additional instructions   senna-docusate 8.6-50 MG tablet Commonly known as: Senokot-S Take 1 tablet by mouth 2 (two) times daily.   TRUEplus Pen Needles 32G X 4 MM Misc Generic drug: Insulin Pen Needle Use to inject insulin.       No Known Allergies  Consultations:  Neurosurgery   Procedures/Studies: CT Angio Head W or Wo Contrast  Result Date: 12/19/2020 CLINICAL DATA:  Initial evaluation for neuro deficit, right-sided weakness, slurred speech, recent stroke. EXAM: CT ANGIOGRAPHY HEAD AND NECK TECHNIQUE: Multidetector CT imaging of the head and neck was performed using the standard protocol during bolus administration of intravenous contrast. Multiplanar CT image reconstructions and MIPs were obtained to evaluate the vascular anatomy. Carotid stenosis measurements (when applicable) are obtained utilizing NASCET criteria, using the distal internal carotid diameter as the denominator. CONTRAST:  24mL OMNIPAQUE IOHEXOL 350 MG/ML SOLN COMPARISON:  Prior MRI from 11/23/2020. FINDINGS: CT HEAD FINDINGS Brain: Cerebral volume within normal limits for age. Remote lacunar infarct at the left  basal ganglia again noted. Additional small remote left parietal infarct recently seen on prior MRI not well visualized by CT. No acute intracranial hemorrhage. No acute large vessel territory infarct. No mass lesion, midline shift or mass effect. Mild ex vacuo dilatation of the left lateral ventricle related to the chronic left basal ganglia infarct. No hydrocephalus. No extra-axial fluid collection. Mineralization about the bilateral basal ganglia noted. Vascular: No hyperdense vessel. Scattered vascular calcifications noted within the carotid siphons. Skull: Scalp soft tissues and calvarium within normal limits. Sinuses: Paranasal sinuses and mastoid air cells are clear. Orbits: Globes and orbital soft tissues within normal limits. Review of the MIP images confirms the above findings CTA NECK FINDINGS Aortic arch: Visualized aortic arch of normal caliber with normal branch pattern. Irregular filling defect within the distal ascending aorta of measuring 1.5 x 0.9 cm consistent with thrombus, suspected to reflect changes of a recently ruptured plaque (series 10, image 8). Finding could serve as an embolic source. No hemodynamically significant stenosis seen about the origin the great vessels. Visualized subclavian arteries patent. Right carotid system: Right CCA patent from its origin to the bifurcation without stenosis. Mild eccentric plaque about the origin of the right ICA with no more than mild 25% stenosis by NASCET criteria. Right ICA patent distally without stenosis, dissection or occlusion. Left carotid  system: Left CCA patent from its origin to the bifurcation without stenosis. Mild atheromatous plaque about the origin of the left ICA without significant stenosis. Left ICA patent distally without stenosis, dissection or occlusion. Vertebral arteries: Both vertebral arteries arise from the subclavian arteries. Vertebral arteries patent without stenosis, dissection or occlusion. Skeleton: No visible acute  osseous finding. No discrete or worrisome osseous lesions. Moderate multilevel cervical spondylosis. Other neck: No other acute soft tissue abnormality within the neck. No mass or adenopathy. Upper chest: Visualized upper chest demonstrates no other acute finding. Review of the MIP images confirms the above findings CTA HEAD FINDINGS Anterior circulation: Petrous segments patent bilaterally. Scattered atheromatous change within the carotid siphons with associated mild to moderate multifocal narrowing. A1 segments patent bilaterally. Normal anterior communicating artery complex. Anterior cerebral arteries patent to their distal aspects without stenosis. No M1 stenosis or occlusion. Normal MCA bifurcations. Distal MCA branches well perfused and symmetric. Posterior circulation: Both V4 segments patent to the vertebrobasilar junction without significant stenosis. Left PICA origin patent and normal. Right PICA not definitely visualized. Basilar patent to its distal aspect without stenosis. Superior cerebellar arteries patent bilaterally. Both PCAs primarily supplied via the basilar well perfused to their distal aspects. Venous sinuses: Patent. Anatomic variants: None significant.  No aneurysm. Review of the MIP images confirms the above findings IMPRESSION: CT HEAD IMPRESSION: 1. No acute intracranial abnormality. 2. Chronic left basal ganglia lacunar infarct. CTA HEAD AND NECK IMPRESSION: 1. Negative CTA for large vessel occlusion. 2. 1.5 x 0.9 cm irregular filling defect within the ascending aorta, consistent with thrombus, suspected to reflect changes of a recently ruptured plaque. Finding could serve as an embolic source. 3. Atheromatous change about the carotid bifurcations and carotid siphons without high-grade or hemodynamically significant stenosis. 4. Otherwise wide patency of the major arterial vasculature of the head and neck. No large vessel occlusion. No other hemodynamically significant or correctable  stenosis. Electronically Signed   By: Jeannine Boga M.D.   On: 12/19/2020 21:56   DG Cervical Spine 2-3 Views  Result Date: 12/25/2020 CLINICAL DATA:  Anterior cervical discectomy and fusion, intraoperative examination EXAM: CERVICAL SPINE - 2-3 VIEW; DG C-ARM 1-60 MIN COMPARISON:  MRI 12/19/2020 FINDINGS: Two fluoroscopic intraoperative cross-table lateral radiographs of the cervical spine are presented postprocedurally. These images demonstrate, initially, a metallic probe overlying the anterior intervertebral disc space of C3-4. Subsequent images demonstrate anterior cervical discectomy and fusion with instrumentation at C3-4. A cylindrical metallic density overlies the posterior elements of C3 and C4, likely an object overlying the patient. No unexpected fracture or listhesis of the visualized cervical spine on this limited examination. FLUOROSCOPY TIME:  Images: 2 Time: 1.26 minutes Dose: 95 mGy IMPRESSION: Intraoperative radiographs as described above. Electronically Signed   By: Fidela Salisbury MD   On: 12/25/2020 16:50   CT Angio Neck W and/or Wo Contrast  Result Date: 12/19/2020 CLINICAL DATA:  Initial evaluation for neuro deficit, right-sided weakness, slurred speech, recent stroke. EXAM: CT ANGIOGRAPHY HEAD AND NECK TECHNIQUE: Multidetector CT imaging of the head and neck was performed using the standard protocol during bolus administration of intravenous contrast. Multiplanar CT image reconstructions and MIPs were obtained to evaluate the vascular anatomy. Carotid stenosis measurements (when applicable) are obtained utilizing NASCET criteria, using the distal internal carotid diameter as the denominator. CONTRAST:  69mL OMNIPAQUE IOHEXOL 350 MG/ML SOLN COMPARISON:  Prior MRI from 11/23/2020. FINDINGS: CT HEAD FINDINGS Brain: Cerebral volume within normal limits for age. Remote lacunar infarct at the  left basal ganglia again noted. Additional small remote left parietal infarct recently seen  on prior MRI not well visualized by CT. No acute intracranial hemorrhage. No acute large vessel territory infarct. No mass lesion, midline shift or mass effect. Mild ex vacuo dilatation of the left lateral ventricle related to the chronic left basal ganglia infarct. No hydrocephalus. No extra-axial fluid collection. Mineralization about the bilateral basal ganglia noted. Vascular: No hyperdense vessel. Scattered vascular calcifications noted within the carotid siphons. Skull: Scalp soft tissues and calvarium within normal limits. Sinuses: Paranasal sinuses and mastoid air cells are clear. Orbits: Globes and orbital soft tissues within normal limits. Review of the MIP images confirms the above findings CTA NECK FINDINGS Aortic arch: Visualized aortic arch of normal caliber with normal branch pattern. Irregular filling defect within the distal ascending aorta of measuring 1.5 x 0.9 cm consistent with thrombus, suspected to reflect changes of a recently ruptured plaque (series 10, image 8). Finding could serve as an embolic source. No hemodynamically significant stenosis seen about the origin the great vessels. Visualized subclavian arteries patent. Right carotid system: Right CCA patent from its origin to the bifurcation without stenosis. Mild eccentric plaque about the origin of the right ICA with no more than mild 25% stenosis by NASCET criteria. Right ICA patent distally without stenosis, dissection or occlusion. Left carotid system: Left CCA patent from its origin to the bifurcation without stenosis. Mild atheromatous plaque about the origin of the left ICA without significant stenosis. Left ICA patent distally without stenosis, dissection or occlusion. Vertebral arteries: Both vertebral arteries arise from the subclavian arteries. Vertebral arteries patent without stenosis, dissection or occlusion. Skeleton: No visible acute osseous finding. No discrete or worrisome osseous lesions. Moderate multilevel cervical  spondylosis. Other neck: No other acute soft tissue abnormality within the neck. No mass or adenopathy. Upper chest: Visualized upper chest demonstrates no other acute finding. Review of the MIP images confirms the above findings CTA HEAD FINDINGS Anterior circulation: Petrous segments patent bilaterally. Scattered atheromatous change within the carotid siphons with associated mild to moderate multifocal narrowing. A1 segments patent bilaterally. Normal anterior communicating artery complex. Anterior cerebral arteries patent to their distal aspects without stenosis. No M1 stenosis or occlusion. Normal MCA bifurcations. Distal MCA branches well perfused and symmetric. Posterior circulation: Both V4 segments patent to the vertebrobasilar junction without significant stenosis. Left PICA origin patent and normal. Right PICA not definitely visualized. Basilar patent to its distal aspect without stenosis. Superior cerebellar arteries patent bilaterally. Both PCAs primarily supplied via the basilar well perfused to their distal aspects. Venous sinuses: Patent. Anatomic variants: None significant.  No aneurysm. Review of the MIP images confirms the above findings IMPRESSION: CT HEAD IMPRESSION: 1. No acute intracranial abnormality. 2. Chronic left basal ganglia lacunar infarct. CTA HEAD AND NECK IMPRESSION: 1. Negative CTA for large vessel occlusion. 2. 1.5 x 0.9 cm irregular filling defect within the ascending aorta, consistent with thrombus, suspected to reflect changes of a recently ruptured plaque. Finding could serve as an embolic source. 3. Atheromatous change about the carotid bifurcations and carotid siphons without high-grade or hemodynamically significant stenosis. 4. Otherwise wide patency of the major arterial vasculature of the head and neck. No large vessel occlusion. No other hemodynamically significant or correctable stenosis. Electronically Signed   By: Jeannine Boga M.D.   On: 12/19/2020 21:56    MR Cervical Spine Wo Contrast  Result Date: 12/19/2020 CLINICAL DATA:  Myelopathy. History of stroke approximately 1 month ago. New unexplained left-sided numbness  and weakness. EXAM: MRI CERVICAL SPINE WITHOUT CONTRAST TECHNIQUE: Multiplanar, multisequence MR imaging of the cervical spine was performed. No intravenous contrast was administered. COMPARISON:  None. FINDINGS: Alignment: Cervical spine straightening. Trace anterolisthesis of C4 on C5. Vertebrae: No fracture, suspicious osseous lesion, or significant marrow edema. Cord: Abnormal T2 hyperintensity in the spinal cord at C3 associated with a large disc extrusion. Posterior Fossa, vertebral arteries, paraspinal tissues: Asymmetric fatty atrophy of the right parotid gland. Preserved vertebral artery flow voids. No cerebellar tonsillar ectopia. Disc levels: C2-3: Minimal disc bulging, infolding of the ligamentum flavum, and right uncovertebral spurring result in mild spinal stenosis and mild right neural foraminal stenosis. C3-4: A large right paracentral disc extrusion with mild superior migration results in severe spinal stenosis and cord compression. Patent neural foramina. C4-5: Disc bulging, uncovertebral spurring, and mild facet arthrosis result in moderate spinal stenosis with mild cord flattening and mild left neural foraminal stenosis. C5-6: Disc bulging and uncovertebral spurring result in mild spinal stenosis and moderate right and mild left neural foraminal stenosis. C6-7: Mild disc bulging and spurring without significant stenosis. C7-T1: Mild left facet arthrosis without stenosis. T1-2: A right foraminal disc protrusion and endplate spurring result in severe right neural foraminal stenosis. No spinal stenosis. IMPRESSION: 1. 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 2. Moderate spinal stenosis at C4-5 and mild spinal  stenosis at C2-3 and C5-6. 3. Severe right neural foraminal stenosis at T1-2. Electronically Signed   By: Logan Bores M.D.   On: 12/19/2020 18:17   DG C-Arm 1-60 Min  Result Date: 12/25/2020 CLINICAL DATA:  Anterior cervical discectomy and fusion, intraoperative examination EXAM: CERVICAL SPINE - 2-3 VIEW; DG C-ARM 1-60 MIN COMPARISON:  MRI 12/19/2020 FINDINGS: Two fluoroscopic intraoperative cross-table lateral radiographs of the cervical spine are presented postprocedurally. These images demonstrate, initially, a metallic probe overlying the anterior intervertebral disc space of C3-4. Subsequent images demonstrate anterior cervical discectomy and fusion with instrumentation at C3-4. A cylindrical metallic density overlies the posterior elements of C3 and C4, likely an object overlying the patient. No unexpected fracture or listhesis of the visualized cervical spine on this limited examination. FLUOROSCOPY TIME:  Images: 2 Time: 1.26 minutes Dose: 95 mGy IMPRESSION: Intraoperative radiographs as described above. Electronically Signed   By: Fidela Salisbury MD   On: 12/25/2020 16:50   ECHOCARDIOGRAM COMPLETE  Result Date: 12/21/2020    ECHOCARDIOGRAM REPORT   Patient Name:   FERNANDO TORRY Date of Exam: 12/21/2020 Medical Rec #:  [id number]        Height:       67.0 in Accession #:    [id number]       Weight:       143.5 lb Date of Birth:  29-Jun-1961         BSA:          1.756 m Patient Age:    29 years         BP:           99/63 mmHg Patient Gender: F                HR:           83 bpm. Exam Location:  Inpatient Procedure: 2D Echo, 3D Echo, Cardiac Doppler, Color Doppler and Strain Analysis Indications:    CVA  History:        Patient has prior history of Echocardiogram  examinations, most                 recent 05/18/2017. Signs/Symptoms:Murmur; Risk                 Factors:Hypertension and Diabetes.  Sonographer:    Luisa Hart RDCS Referring Phys: [id number] [place]  1. Left ventricular  ejection fraction by 3D volume is 57 %. The left ventricle has normal function. The left ventricle has no regional wall motion abnormalities. There is mild left ventricular hypertrophy. Left ventricular diastolic parameters are consistent with Grade I diastolic dysfunction (impaired relaxation).  2. Right ventricular systolic function is normal. The right ventricular size is normal. There is normal pulmonary artery systolic pressure.  3. The mitral valve is normal in structure. Trivial mitral valve regurgitation. No evidence of mitral stenosis.  4. The aortic valve is grossly normal. Aortic valve regurgitation is trivial. No aortic stenosis is present.  5. The inferior vena cava is normal in size with greater than 50% respiratory variability, suggesting right atrial pressure of 3 mmHg. Conclusion(s)/Recommendation(s): No intracardiac source of embolism detected on this transthoracic study. A transesophageal echocardiogram is recommended to exclude cardiac source of embolism if clinically indicated. FINDINGS  Left Ventricle: Left ventricular ejection fraction by 3D volume is 57 %. The left ventricle has normal function. The left ventricle has no regional wall motion abnormalities. Global longitudinal strain performed but not reported based on interpreter judgement due to suboptimal tracking. The left ventricular internal cavity size was normal in size. There is mild left ventricular hypertrophy. Left ventricular diastolic parameters are consistent with Grade I diastolic dysfunction (impaired relaxation). Right Ventricle: The right ventricular size is normal. No increase in right ventricular wall thickness. Right ventricular systolic function is normal. There is normal pulmonary artery systolic pressure. The tricuspid regurgitant velocity is 1.87 m/s, and  with an assumed right atrial pressure of 3 mmHg, the estimated right ventricular systolic pressure is 32.1 mmHg. Left Atrium: Left atrial size was normal in size.  Right Atrium: Right atrial size was normal in size. Pericardium: There is no evidence of pericardial effusion. Mitral Valve: The mitral valve is normal in structure. Trivial mitral valve regurgitation. No evidence of mitral valve stenosis. Tricuspid Valve: The tricuspid valve is normal in structure. Tricuspid valve regurgitation is trivial. No evidence of tricuspid stenosis. Aortic Valve: The aortic valve is grossly normal. Aortic valve regurgitation is trivial. No aortic stenosis is present. Aortic valve mean gradient measures 5.0 mmHg. Aortic valve peak gradient measures 10.0 mmHg. Aortic valve area, by VTI measures 2.43 cm. Pulmonic Valve: The pulmonic valve was grossly normal. Pulmonic valve regurgitation is trivial. No evidence of pulmonic stenosis. Aorta: The aortic root is normal in size and structure. Venous: The inferior vena cava is normal in size with greater than 50% respiratory variability, suggesting right atrial pressure of 3 mmHg. IAS/Shunts: No atrial level shunt detected by color flow Doppler.  LEFT VENTRICLE PLAX 2D LVIDd:         3.20 cm         Diastology LVIDs:         2.80 cm         LV e' medial:    6.96 cm/s LV PW:         1.20 cm         LV E/e' medial:  12.7 LV IVS:        1.30 cm         LV e' lateral:  4.74 cm/s LVOT diam:     2.00 cm         LV E/e' lateral: 18.6 LV SV:         69 LV SV Index:   39 LVOT Area:     3.14 cm        3D Volume EF                                LV 3D EF:    Left                                             ventricular                                             ejection                                             fraction by                                             3D volume                                             is 57 %.                                 3D Volume EF:                                3D EF:        57 %  PULMONARY VEINS A Reversal Duration: 132.00 msec A Reversal Velocity: 62.13 cm/s Diastolic Velocity:  08.65 cm/s S/D Velocity:         7.84 Systolic Velocity:   69.62 cm/s LEFT ATRIUM             Index       RIGHT ATRIUM           Index LA diam:        2.20 cm 1.25 cm/m  RA Area:     10.60 cm LA Vol (A2C):   15.0 ml 8.54 ml/m  RA Volume:   20.30 ml  11.56 ml/m LA Vol (A4C):   39.5 ml 22.49 ml/m LA Biplane Vol: 26.4 ml 15.03 ml/m  AORTIC VALVE                    PULMONIC VALVE AV Area (Vmax):    2.13 cm     PV Vmax:       1.01 m/s AV Area (Vmean):   2.04 cm     PV Vmean:      71.700 cm/s AV Area (VTI):  2.43 cm     PV VTI:        0.227 m AV Vmax:           158.00 cm/s  PV Peak grad:  4.1 mmHg AV Vmean:          108.000 cm/s PV Mean grad:  2.0 mmHg AV VTI:            0.285 m AV Peak Grad:      10.0 mmHg AV Mean Grad:      5.0 mmHg LVOT Vmax:         107.00 cm/s LVOT Vmean:        70.000 cm/s LVOT VTI:          0.220 m LVOT/AV VTI ratio: 0.77  AORTA Ao Root diam: 2.70 cm Ao Asc diam:  2.80 cm MITRAL VALVE                TRICUSPID VALVE MV Area (PHT): 5.42 cm     TR Peak grad:   14.0 mmHg MV Decel Time: 140 msec     TR Vmax:        187.00 cm/s MV E velocity: 88.30 cm/s MV A velocity: 115.00 cm/s  SHUNTS MV E/A ratio:  0.77         Systemic VTI:  0.22 m                             Systemic Diam: 2.00 cm Cherlynn Kaiser MD Electronically signed by Cherlynn Kaiser MD Signature Date/Time: 12/21/2020/2:12:16 PM    Final        Subjective:  Patient seen and examined at the bedside this morning.  Hemodynamically stable for discharge today.  Discharge Exam: Vitals:   12/28/20 0518 12/28/20 0748  BP: 94/65 114/74  Pulse: 93 99  Resp: 18 16  Temp: 99.1 F (37.3 C) 97.7 F (36.5 C)  SpO2: 95% 98%   Vitals:   12/27/20 1506 12/27/20 2057 12/28/20 0518 12/28/20 0748  BP: 103/73 116/70 94/65 114/74  Pulse: 96 91 93 99  Resp: 14 18 18 16   Temp: 98.3 F (36.8 C) 98.3 F (36.8 C) 99.1 F (37.3 C) 97.7 F (36.5 C)  TempSrc: Oral  Oral Oral  SpO2: 97% 98% 95% 98%  Weight:      Height:        General: Pt is alert, awake, not  in acute distress Cardiovascular: RRR, S1/S2 +, no rubs, no gallops Respiratory: CTA bilaterally, no wheezing, no rhonchi Abdominal: Soft, NT, ND, bowel sounds + Extremities: no edema, no cyanosis    The results of significant diagnostics from this hospitalization (including imaging, microbiology, ancillary and laboratory) are listed below for reference.     Microbiology: Recent Results (from the past 240 hour(s))  Resp Panel by RT-PCR (Flu A&B, Covid) Nasopharyngeal Swab     Status: None   Collection Time: 12/19/20  6:38 PM   Specimen: Nasopharyngeal Swab; Nasopharyngeal(NP) swabs in vial transport medium  Result Value Ref Range Status   SARS Coronavirus 2 by RT PCR NEGATIVE NEGATIVE Final    Comment: (NOTE) SARS-CoV-2 target nucleic acids are NOT DETECTED.  The SARS-CoV-2 RNA is generally detectable in upper respiratory specimens during the acute phase of infection. The lowest concentration of SARS-CoV-2 viral copies this assay can detect is 138 copies/mL. A negative result does not preclude SARS-Cov-2 infection and should not be used as the sole basis for treatment  or other patient management decisions. A negative result may occur with  improper specimen collection/handling, submission of specimen other than nasopharyngeal swab, presence of viral mutation(s) within the areas targeted by this assay, and inadequate number of viral copies(<138 copies/mL). A negative result must be combined with clinical observations, patient history, and epidemiological information. The expected result is Negative.  Fact Sheet for Patients:  EntrepreneurPulse.com.au  Fact Sheet for Healthcare Providers:  IncredibleEmployment.be  This test is no t yet approved or cleared by the [place] FDA and  has been authorized for detection and/or diagnosis of SARS-CoV-2 by FDA under an Emergency Use Authorization (EUA). This EUA will remain  in effect (meaning  this test can be used) for the duration of the COVID-19 declaration under Section 564(b)(1) of the Act, 21 U.S.C.section 360bbb-3(b)(1), unless the authorization is terminated  or revoked sooner.       Influenza A by PCR NEGATIVE NEGATIVE Final   Influenza B by PCR NEGATIVE NEGATIVE Final    Comment: (NOTE) The Xpert Xpress SARS-CoV-2/FLU/RSV plus assay is intended as an aid in the diagnosis of influenza from Nasopharyngeal swab specimens and should not be used as a sole basis for treatment. Nasal washings and aspirates are unacceptable for Xpert Xpress SARS-CoV-2/FLU/RSV testing.  Fact Sheet for Patients: EntrepreneurPulse.com.au  Fact Sheet for Healthcare Providers: IncredibleEmployment.be  This test is not yet approved or cleared by the [place] FDA and has been authorized for detection and/or diagnosis of SARS-CoV-2 by FDA under an Emergency Use Authorization (EUA). This EUA will remain in effect (meaning this test can be used) for the duration of the COVID-19 declaration under Section 564(b)(1) of the Act, 21 U.S.C. section 360bbb-3(b)(1), unless the authorization is terminated or revoked.  Performed at [facility], [place] [address] [place], [place] [address]   Surgical pcr screen     Status: None   Collection Time: 12/24/20  2:31 PM   Specimen: Nasal Mucosa; Nasal Swab  Result Value Ref Range Status   MRSA, PCR NEGATIVE NEGATIVE Final   Staphylococcus aureus NEGATIVE NEGATIVE Final    Comment: (NOTE) The Xpert SA Assay (FDA approved for NASAL specimens in patients 71 years of age and older), is one component of a comprehensive surveillance program. It is not intended to diagnose infection nor to guide or monitor treatment. Performed at [facility], [place] [address], [place], [place] [address]      Labs: BNP (last 3 results) No results for input(s): BNP in the last 8760 hours. Basic Metabolic Panel: Recent  Labs  Lab 12/24/20 0251 12/26/20 1253 12/27/20 0353  NA 138 135 137  K 4.0 4.0 4.0  CL 105 100 103  CO2 26 26 24   GLUCOSE 144* 399* 193*  BUN 18 18 20   CREATININE 1.00 0.86 0.96  CALCIUM 9.2 9.7 9.7  MG 2.0  --   --    Liver Function Tests: No results for input(s): AST, ALT, ALKPHOS, BILITOT, PROT, ALBUMIN in the last 168 hours. No results for input(s): LIPASE, AMYLASE in the last 168 hours. No results for input(s): AMMONIA in the last 168 hours. CBC: Recent Labs  Lab 12/24/20 0251 12/27/20 0353  WBC 7.8 11.5*  NEUTROABS  --  6.2  HGB 10.6* 11.4*  HCT 32.5* 35.9*  MCV 94.8 94.0  PLT 335 337   Cardiac Enzymes: No results for input(s): CKTOTAL, CKMB, CKMBINDEX, TROPONINI in the last 168 hours. BNP: Invalid input(s): POCBNP CBG: Recent Labs  Lab 12/27/20 0757  12/27/20 1311 12/27/20 1708 12/27/20 2054 12/28/20 0610  GLUCAP 158* 185* 227* 114* 318*   D-Dimer No results for input(s): DDIMER in the last 72 hours. Hgb A1c No results for input(s): HGBA1C in the last 72 hours. Lipid Profile No results for input(s): CHOL, HDL, LDLCALC, TRIG, CHOLHDL, LDLDIRECT in the last 72 hours. Thyroid function studies No results for input(s): TSH, T4TOTAL, T3FREE, THYROIDAB in the last 72 hours.  Invalid input(s): FREET3 Anemia work up No results for input(s): VITAMINB12, FOLATE, FERRITIN, TIBC, IRON, RETICCTPCT in the last 72 hours. Urinalysis    Component Value Date/Time   COLORURINE YELLOW 04/06/2016 1932   APPEARANCEUR CLEAR 04/06/2016 1932   LABSPEC 1.036 (H) 04/06/2016 1932   PHURINE 5.5 04/06/2016 1932   GLUCOSEU >1000 (A) 04/06/2016 1932   HGBUR NEGATIVE 04/06/2016 1932   BILIRUBINUR negative 06/04/2018 1101   KETONESUR negative 06/04/2018 1101   KETONESUR NEGATIVE 04/06/2016 1932   PROTEINUR NEGATIVE 04/06/2016 1932   UROBILINOGEN 0.2 06/04/2018 1101   UROBILINOGEN 0.2 11/13/2014 0942   NITRITE Positive (A) 06/04/2018 1101   NITRITE NEGATIVE 04/06/2016 1932    LEUKOCYTESUR Negative 06/04/2018 1101   Sepsis Labs Invalid input(s): PROCALCITONIN,  WBC,  LACTICIDVEN Microbiology Recent Results (from the past 240 hour(s))  Resp Panel by RT-PCR (Flu A&B, Covid) Nasopharyngeal Swab     Status: None   Collection Time: 12/19/20  6:38 PM   Specimen: Nasopharyngeal Swab; Nasopharyngeal(NP) swabs in vial transport medium  Result Value Ref Range Status   SARS Coronavirus 2 by RT PCR NEGATIVE NEGATIVE Final    Comment: (NOTE) SARS-CoV-2 target nucleic acids are NOT DETECTED.  The SARS-CoV-2 RNA is generally detectable in upper respiratory specimens during the acute phase of infection. The lowest concentration of SARS-CoV-2 viral copies this assay can detect is 138 copies/mL. A negative result does not preclude SARS-Cov-2 infection and should not be used as the sole basis for treatment or other patient management decisions. A negative result may occur with  improper specimen collection/handling, submission of specimen other than nasopharyngeal swab, presence of viral mutation(s) within the areas targeted by this assay, and inadequate number of viral copies(<138 copies/mL). A negative result must be combined with clinical observations, patient history, and epidemiological information. The expected result is Negative.  Fact Sheet for Patients:  EntrepreneurPulse.com.au  Fact Sheet for Healthcare Providers:  IncredibleEmployment.be  This test is no t yet approved or cleared by the [place] FDA and  has been authorized for detection and/or diagnosis of SARS-CoV-2 by FDA under an Emergency Use Authorization (EUA). This EUA will remain  in effect (meaning this test can be used) for the duration of the COVID-19 declaration under Section 564(b)(1) of the Act, 21 U.S.C.section 360bbb-3(b)(1), unless the authorization is terminated  or revoked sooner.       Influenza A by PCR NEGATIVE NEGATIVE Final   Influenza  B by PCR NEGATIVE NEGATIVE Final    Comment: (NOTE) The Xpert Xpress SARS-CoV-2/FLU/RSV plus assay is intended as an aid in the diagnosis of influenza from Nasopharyngeal swab specimens and should not be used as a sole basis for treatment. Nasal washings and aspirates are unacceptable for Xpert Xpress SARS-CoV-2/FLU/RSV testing.  Fact Sheet for Patients: EntrepreneurPulse.com.au  Fact Sheet for Healthcare Providers: IncredibleEmployment.be  This test is not yet approved or cleared by the [place] FDA and has been authorized for detection and/or diagnosis of SARS-CoV-2 by FDA under an Emergency Use Authorization (EUA). This EUA will remain in effect (meaning this test can  be used) for the duration of the COVID-19 declaration under Section 564(b)(1) of the Act, 21 U.S.C. section 360bbb-3(b)(1), unless the authorization is terminated or revoked.  Performed at [facility], [place] [address], [place], [place] [address]   Surgical pcr screen     Status: None   Collection Time: 12/24/20  2:31 PM   Specimen: Nasal Mucosa; Nasal Swab  Result Value Ref Range Status   MRSA, PCR NEGATIVE NEGATIVE Final   Staphylococcus aureus NEGATIVE NEGATIVE Final    Comment: (NOTE) The Xpert SA Assay (FDA approved for NASAL specimens in patients 52 years of age and older), is one component of a comprehensive surveillance program. It is not intended to diagnose infection nor to guide or monitor treatment. Performed at [facility], [place] [address], [place], [place] [address]     Please note: You were cared for by a hospitalist during your hospital stay. Once you are discharged, your primary care physician will handle any further medical issues. Please note that NO REFILLS for any discharge medications will be authorized once you are discharged, as it is imperative that you return to your primary care physician (or establish a relationship with a primary  care physician if you do not have one) for your post hospital discharge needs so that they can reassess your need for medications and monitor your lab values.    Time coordinating discharge: 40 minutes  SIGNED:   Shelly Coss, MD  [facility] 12/28/2020, 10:24 AM Pager [phone]  If 7PM-7AM, please contact night-coverage [email or web address] Password [email or web address]

## 2020-12-28 NOTE — PMR Pre-admission (Signed)
PMR Admission Coordinator Pre-Admission Assessment  Patient: Ashley [name] is an 60 y.o., female MRN: [id number] DOB: 11/05/1960 Height: 5' 7" (170.2 cm) Weight: 65.1 kg  Insurance Information HMO:    PPO:      PCP:      IPA:      80/20:      OTHER:  PRIMARY: [facility]      Policy#: [id number]      Subscriber: pt CM Name: faxed approval      Phone#:      Fax#: [phone] Pre-Cert#: [id number] [place] for CIR via fax with updates due on 3/31 to fax listed above    Employer:  Benefits:  Phone #: [phone]     Name:  Eff. Date: 10/07/19     Deduct: $0      Out of Pocket Max: [phone] ($0 met)      Life Max: n/a CIR: $3000/day for days 1 and 2, then 50% until OOPM met       SNF: 50% Outpatient:      Co-Pay: $100/visit Home Health: 50%      Co-Ins: 50% DME: 50%     Co-Ins: 50% Providers:  SECONDARY:       Policy#:      Phone#:   Development worker, community:       Phone#:   The Therapist, art Information Summary" for patients in Inpatient Rehabilitation Facilities with attached "Privacy Act [place] Records" was provided and verbally reviewed with: N/A  Emergency Contact Information Contact Information    Name Relation Home Work Mobile   Reed,Gwendolyn Sister [phone]        Current Medical History  Patient Admitting Diagnosis: cervical myelopathy, s/p ACDF C3-4  History of Present Illness: pt is a 60 y/o female with PMH of DM2, bipolar, CVA (1 month prior to admit), and tobacco use who was admitted to [facility] on 12/18/2020 with progressive LE weakness.  Pt was seen by neurology on 3/14 and noted to have quadiparesis with R>L weakness with scheduled MRI of C-spine as well as echo, CTA head/neck.  On the day of admission, pt presented to the ED with worsening weakness.  MRI cervical spine showed large R paracentral disc extrusion at C3-4 with severe spinal stenosis, cord compression, and signal abnormality compatible with spondylotic myelopathy, and moderate stenosis at  C4-5, mild stenosis at C2-3 and C5-6.  Neurosurgery consulted and recommended immediate surgery once aspirin/plavix wash out completed.  Pt underwent ACDF C3-4 on 3/22 per Dr. Zada Finders.  Post op course course pain control and insulin management.  Therapy evaluations were completed and pt was recommended for CIR.     Patient's medical record from [facility] has been reviewed by the rehabilitation admission coordinator and physician.  Past Medical History  Past Medical History:  Diagnosis Date  . Anxiety   . Arthritis   . Bipolar 1 disorder ([place])   . Depression   . Diabetes mellitus without complication ([place])    Type II  . GERD (gastroesophageal reflux disease)   . Heart murmur    "nothing to worry about"  . HTN (hypertension)    not on medication  . Stroke [facility]) 11/2020    Family History   family history includes Diabetes in her brother; Diabetes Mellitus II in her father; Healthy in her mother.  Prior Rehab/Hospitalizations Has the patient had prior rehab or hospitalizations prior to admission? Yes  Has the patient had major surgery during 100 days prior to admission? Yes  Current Medications  Current Facility-Administered Medications:  .  acetaminophen (TYLENOL) tablet 650 mg, 650 mg, Oral, Q6H PRN, 650 mg at 12/27/20 0857 **OR** acetaminophen (TYLENOL) suppository 650 mg, 650 mg, Rectal, Q6H PRN, Judith Part, MD .  ascorbic acid (VITAMIN C) tablet 500 mg, 500 mg, Oral, Daily, Judith Part, MD, 500 mg at 12/28/20 0910 .  Chlorhexidine Gluconate Cloth 2 % PADS 6 each, 6 each, Topical, Daily, Shelly Coss, MD, 6 each at 12/28/20 0914 .  enoxaparin (LOVENOX) injection 40 mg, 40 mg, Subcutaneous, Q24H, Donnamae Jude, RPH, 40 mg at 12/28/20 6381 .  feeding supplement (ENSURE ENLIVE / ENSURE PLUS) liquid 237 mL, 237 mL, Oral, BID BM, Judith Part, MD, 237 mL at 12/28/20 0909 .  gabapentin (NEURONTIN) capsule 600 mg, 600 mg, Oral, TID, Judith Part, MD, 600 mg at 12/27/20 2208 .  glipiZIDE (GLUCOTROL XL) 24 hr tablet 20 mg, 20 mg, Oral, Daily, Ostergard, Thomas A, MD, 20 mg at 12/28/20 0910 .  hydrALAZINE (APRESOLINE) injection 10 mg, 10 mg, Intravenous, Q6H PRN, Adhikari, Amrit, MD .  HYDROmorphone (DILAUDID) injection 0.5 mg, 0.5 mg, Intravenous, Q4H PRN, Ostergard, Thomas A, MD .  insulin aspart (novoLOG) injection 0-15 Units, 0-15 Units, Subcutaneous, TID WC, Shelly Coss, MD, 11 Units at 12/28/20 0910 .  insulin aspart (novoLOG) injection 0-5 Units, 0-5 Units, Subcutaneous, QHS, Shelly Coss, MD, 3 Units at 12/26/20 2152 .  insulin aspart (novoLOG) injection 8 Units, 8 Units, Subcutaneous, TID WC, Shelly Coss, MD, 8 Units at 12/28/20 0921 .  insulin detemir (LEVEMIR) injection 40 Units, 40 Units, Subcutaneous, Daily, Judith Part, MD, 40 Units at 12/28/20 0911 .  multivitamin with minerals tablet 1 tablet, 1 tablet, Oral, Daily, Judith Part, MD, 1 tablet at 12/28/20 0910 .  ondansetron (ZOFRAN) tablet 4 mg, 4 mg, Oral, Q6H PRN **OR** ondansetron (ZOFRAN) injection 4 mg, 4 mg, Intravenous, Q6H PRN, Ostergard, Thomas A, MD .  oxyCODONE (Oxy IR/ROXICODONE) immediate release tablet 10 mg, 10 mg, Oral, Q4H PRN, Judith Part, MD, 10 mg at 12/28/20 0909 .  oxyCODONE (Oxy IR/ROXICODONE) immediate release tablet 5 mg, 5 mg, Oral, Q4H PRN, Judith Part, MD .  QUEtiapine (SEROQUEL) tablet 400 mg, 400 mg, Oral, BID, Judith Part, MD, 400 mg at 12/28/20 0909 .  rosuvastatin (CRESTOR) tablet 40 mg, 40 mg, Oral, Daily, Judith Part, MD, 40 mg at 12/28/20 0910 .  senna-docusate (Senokot-S) tablet 1 tablet, 1 tablet, Oral, BID, Judith Part, MD, 1 tablet at 12/28/20 0910 .  sodium chloride flush (NS) 0.9 % injection 3 mL, 3 mL, Intravenous, Q12H, Ostergard, Thomas A, MD, 3 mL at 12/27/20 2200  Patients Current Diet:  Diet Order            Diet - low sodium heart healthy            Diet regular Room service appropriate? Yes; Fluid consistency: Thin  Diet effective now                 Precautions / Restrictions Precautions Precautions: Fall,Cervical Precaution Booklet Issued: No Precaution Comments: Reviewed cervical precautions with pt Restrictions Weight Bearing Restrictions: No   Has the patient had 2 or more falls or a fall with injury in the past year? Yes  Prior Activity Level Limited Community (1-2x/wk): independent prior to admission, driving, running errands a few times/week, no DME at baseline  Prior Functional Level Self Care: Did the patient need help  bathing, dressing, using the toilet or eating? Independent  Indoor Mobility: Did the patient need assistance with walking from room to room (with or without device)? Independent  Stairs: Did the patient need assistance with internal or external stairs (with or without device)? Independent  Functional Cognition: Did the patient need help planning regular tasks such as shopping or remembering to take medications? [place] / Equipment Home Assistive Devices/Equipment: Environmental consultant (specify type),Cane (specify quad or straight) Home Equipment: Walker - 2 wheels,Bedside commode  Prior Device Use: Indicate devices/aids used by the patient prior to current illness, exacerbation or injury? Manual wheelchair  Current Functional Level Cognition  Overall Cognitive Status: No family/caregiver present to determine baseline cognitive functioning Orientation Level: Oriented X4 General Comments: Pt with decreased awareness of current deficits and safety.    Extremity Assessment (includes Sensation/Coordination)  Upper Extremity Assessment: Generalized weakness,RUE deficits/detail RUE Deficits / Details: Significant RUE weakness. weak grip strength and only able to flex R shoulder to ~60 degrees. RUE Coordination: decreased fine motor  Lower Extremity Assessment: Defer to PT  evaluation RLE Deficits / Details: Grossly 3+/5 throughout RLE.    ADLs  Overall ADL's : Needs assistance/impaired Eating/Feeding: Set up,Independent,Sitting Grooming: Wash/dry face,Wash/dry hands,Standing,Min guard Grooming Details (indicate cue type and reason): pt unsteady Upper Body Bathing: Min guard,Sitting Lower Body Bathing: Minimal assistance,Sitting/lateral leans,Sit to/from stand Upper Body Dressing : Min guard,Sitting Lower Body Dressing: Minimal assistance,Sitting/lateral leans,Sit to/from stand Toilet Transfer: Ambulation,Comfort height toilet,Grab bars,Cueing for safety,Minimal assistance,Min guard,RW Toileting- Clothing Manipulation and Hygiene: Min guard,Sit to/from stand Scientist, research (medical): Minimal assistance,Ambulation,3 in 1,Grab bars,Cueing for safety Functional mobility during ADLs: Cueing for safety,Cueing for sequencing,Minimal assistance,Min guard,Rolling walker    Mobility  Overal bed mobility: Needs Assistance Bed Mobility: Rolling,Sidelying to Sit Rolling: Min assist Sidelying to sit: Mod assist Supine to sit: Min guard Sit to supine: Supervision Sit to sidelying: Min assist General bed mobility comments: Min assistance to advance LEs and elevate trunk into sitting.  Min assistance to lift B LEs against gravity for back to bed.  slight twisting noted this session when going back to bed.  Continued cues for logrolling to avoid twisting.    Transfers  Overall transfer level: Needs assistance Equipment used: None Transfers: Sit to/from Stand Sit to Stand: Min assist General transfer comment: Min assistance with cues for hand placement to and from seated surface.  pt reaching to pull on RW into standing.    Ambulation / Gait / Stairs / Wheelchair Mobility  Ambulation/Gait Ambulation/Gait assistance: Herbalist (Feet): 40 Feet (x2 ( seated rest break between trials )) Assistive device: Rolling walker (2 wheeled) Gait Pattern/deviations:  Step-through pattern,Decreased stride length,Decreased weight shift to left,Trendelenburg General Gait Details: Pt continues with trendelenberg gt on R.  Poor foot clearance on R side.  Pt did use RW and able to maintain grip on R side this session with assistance.  Continues to require min assistance to turn and back with RW. Gait velocity: Decreased    Posture / Balance Balance Overall balance assessment: Needs assistance Sitting-balance support: No upper extremity supported,Feet supported Sitting balance-Leahy Scale: Fair Standing balance support: No upper extremity supported,During functional activity Standing balance-Leahy Scale: Poor Standing balance comment: Reliant on external support    Special needs/care consideration Skin surgical incision to ant neck, Diabetic management yes and Special service needs may need bowel/bladder program?   Previous Home Environment (from acute therapy documentation) Living Arrangements: Children Available Help at Discharge: Family Type of Home:  Apartment Home Layout: One level Home Access: Stairs to enter Entrance Stairs-Rails: Right Entrance Stairs-Number of Steps: 12 Bathroom Shower/Tub: Chiropodist: Handicapped [place]: No  Discharge Living Setting Plans for Discharge Living Setting: Patient's home,Alone Type of Home at Discharge: Apartment Discharge Home Layout: One level Discharge Home Access: Stairs to enter Entrance Stairs-Rails: Right Entrance Stairs-Number of Steps: full flight Discharge Bathroom Shower/Tub: Tub/shower unit Discharge Bathroom Toilet: Handicapped height Discharge Bathroom Accessibility: Yes How Accessible: Accessible via walker Does the patient have any problems obtaining your medications?: No  Social/Family/Support Systems Anticipated Caregiver: daughters Clyde Canterbury and [place]) Anticipated Caregiver's Contact Information: Clyde Canterbury [phone]; [name]  [phone] Ability/Limitations of Caregiver: no physical limitations, Clyde Canterbury can provide 24/7 if needed Caregiver Availability: 24/7 Discharge Plan Discussed with Primary Caregiver: Yes Is Caregiver In Agreement with Plan?: Yes Does Caregiver/Family have Issues with Lodging/Transportation while Pt is in Rehab?: No  Goals Patient/Family Goal for Rehab: PT/OT mod I, SLP n/a Expected length of stay: 14-16 days Additional Information: daughters planning to provide whatever assist is needed at discharge, Clyde Canterbury does not work during the day Pt/Family Agrees to Admission and willing to participate: Yes Program Orientation Provided & Reviewed with Pt/Caregiver Including Roles  & Responsibilities: Yes Additional Information Needs: n/a  Barriers to Discharge: Insurance for SNF coverage  Decrease burden of Care through IP rehab admission: n/a Possible need for SNF placement upon discharge: Not anticipated.   Patient Condition: I have reviewed medical records from [facility], spoken with CM, and patient and daughters. I met with patient at the bedside for inpatient rehabilitation assessment.  Patient will benefit from ongoing PT and OT, can actively participate in 3 hours of therapy a day 5 days of the week, and can make measurable gains during the admission.  Patient will also benefit from the coordinated team approach during an Inpatient Acute Rehabilitation admission.  The patient will receive intensive therapy as well as Rehabilitation physician, nursing, social worker, and care management interventions.  Due to bladder management, bowel management, safety, skin/wound care, disease management, medication administration, pain management and patient education the patient requires 24 hour a day rehabilitation nursing.  The patient is currently min to mod with mobility and basic ADLs.  Discharge setting and therapy post discharge at home with home health is anticipated.  Patient has agreed to  participate in the Acute Inpatient Rehabilitation Program and will admit today.  Preadmission Screen Completed By:  Michel Santee, PT, DPT 12/28/2020 11:49 AM ______________________________________________________________________   Discussed status with Dr. Posey Pronto on 12/28/20  at 11:49 AM  and received approval for admission today.  Admission Coordinator:  Michel Santee, PT, DPT time 11:49 AM Sudie Grumbling 12/28/20    Assessment/Plan: Diagnosis: cervical myelopathy, s/p ACDF C3-4  1. Does the need for close, 24 hr/day Medical supervision in concert with the patient's rehab needs make it unreasonable for this patient to be served in a less intensive setting? Yes  2. Co-Morbidities requiring supervision/potential complications: DM2, bipolar, CVA (1 month prior to admit), and tobacco use 3. Due to bladder management, bowel management, safety, skin/wound care, disease management, medication administration, pain management and patient education, does the patient require 24 hr/day rehab nursing? Yes 4. Does the patient require coordinated care of a physician, rehab nurse, PT, OT, and SLP to address physical and functional deficits in the context of the above medical diagnosis(es)? Yes Addressing deficits in the following areas: balance, endurance, locomotion, strength, transferring, bowel/bladder control, bathing, dressing, toileting and psychosocial  support 5. Can the patient actively participate in an intensive therapy program of at least 3 hrs of therapy 5 days a week? Yes 6. The potential for patient to make measurable gains while on inpatient rehab is excellent 7. Anticipated functional outcomes upon discharge from inpatient rehab: supervision and min assist PT, supervision and min assist OT, modified independent SLP 8. Estimated rehab length of stay to reach the above functional goals is: 16-19 days. 9. Anticipated discharge destination: Home 10. Overall Rehab/Functional Prognosis: excellent  MD  Signature: Delice Lesch, MD, ABPMR

## 2020-12-28 NOTE — Progress Notes (Signed)
Occupational Therapy Treatment Patient Details Name: Ashley [name] MRN: [id number] DOB: Jun 07, 1961 Today's Date: 12/28/2020    History of present illness Pt is a 60 y/o female presenting to the ED after falls and weakness on R side that has been going on for ~3 weeks. Per notes, pt had MRI outside of hospital that showed L parietal CVA. 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 Pt was then admitted to hospital. PMH includes DM, HTN, and bipolar disorder. Pt is now s/p C3-4 ACDF on 3/22.   OT comments  Pt in bed upon arrival, agreeable to OT session. Pt currently requires minimal assistance for bed mobility and functional mobility at RW level. Pt demonstrates cognitive limitations and has a decreased awareness of deficits and of safety. Due to pt's instability standing while engaging in ADL, she required seated position during oral care at sink level. She required assistance for compensatory strategies, max cues to utilize RUE and hand over hand assistance for coordinated movements of RUE. Pt will continue to benefit from skilled OT services to maximize safety and independence with ADL/IADL and functional mobility. Will continue to follow acutely and progress as tolerated.    Follow Up Recommendations  CIR    Equipment Recommendations  3 in 1 bedside commode;Other (comment) (RW)    Recommendations for Other Services      Precautions / Restrictions Precautions Precautions: Fall;Cervical Precaution Booklet Issued: No Precaution Comments: Reviewed cervical precautions with pt Required Braces or Orthoses:  (per notes no brace required.) Restrictions Weight Bearing Restrictions: No       Mobility Bed Mobility Overal bed mobility: Needs Assistance Bed Mobility: Rolling;Sidelying to Sit Rolling: Min assist Sidelying to sit: Min assist     Sit to sidelying: Min assist General bed mobility comments: minA to progress LE and  progress trunk into upright position    Transfers Overall transfer level: Needs assistance Equipment used: Rolling walker (2 wheeled) Transfers: Sit to/from Stand Sit to Stand: Min assist         General transfer comment: minA for safe hand placement, and assist to powerup into standing    Balance Overall balance assessment: Needs assistance Sitting-balance support: No upper extremity supported;Feet supported Sitting balance-Leahy Scale: Fair     Standing balance support: During functional activity;Single extremity supported Standing balance-Leahy Scale: Poor Standing balance comment: Reliant on external support                           ADL either performed or assessed with clinical judgement   ADL Overall ADL's : Needs assistance/impaired Eating/Feeding: Minimal assistance;Sitting Eating/Feeding Details (indicate cue type and reason): to use RUE during meal Grooming: Wash/dry face;Wash/dry hands;Minimal assistance;Sitting Grooming Details (indicate cue type and reason): pt unsteady to complete in standing, required hand over hadn assistance for coordinated movements of RUE                 Toilet Transfer: Minimal assistance;Ambulation;RW Toilet Transfer Details (indicate cue type and reason): simulated in room, pt tolerated 60feet before right knee began to buckle Toileting- Clothing Manipulation and Hygiene: Minimal assistance;Sit to/from stand       Functional mobility during ADLs: Cueing for safety;Cueing for sequencing;Minimal assistance;Rolling walker General ADL Comments: pt limited by decreased awareness of deficits, decreased safety awareness, RLE and RUE weakness;assistance for compensatory strategies     Vision   Vision Assessment?: Yes Ocular Range of Motion:  Restricted on the right Alignment/Gaze Preference: Head turned Tracking/Visual Pursuits: Decreased smoothness of horizontal tracking;Requires cues, head turns, or add eye shifts to  track;Unable to hold eye position out of midline;Decreased smoothness of eye movement to RIGHT inferior field;Decreased smoothness of eye movement to RIGHT superior field Convergence: Impaired - to be further tested in functional context Visual Fields: Right inferior homonymous quadranopsia Additional Comments: pt with decreased visual attention, required consistent cues for following directions of eye exam   Perception     Praxis      Cognition Arousal/Alertness: Awake/alert Behavior During Therapy: WFL for tasks assessed/performed Overall Cognitive Status: No family/caregiver present to determine baseline cognitive functioning                                 General Comments: Pt with decreased awareness of current deficits and safety. Pt requesting to complete ADL and mobility independently, stating she feels safe to mobilize independently        Exercises Other Exercises Other Exercises: Hands clapsed and performed chest presses to encourage ROM in R arm Other Exercises: R LAQs x 10 reps in sitting.   Shoulder Instructions       General Comments vss    Pertinent Vitals/ Pain       Pain Assessment: Faces Faces Pain Scale: Hurts little more Pain Location: neck Pain Descriptors / Indicators: Discomfort Pain Intervention(s): Monitored during session;Limited activity within patient's tolerance  Home Living                                          Prior Functioning/Environment              Frequency  Min 2X/week        Progress Toward Goals  OT Goals(current goals can now be found in the care plan section)  Progress towards OT goals: Progressing toward goals  Acute Rehab OT Goals Patient Stated Goal: to be independent OT Goal Formulation: With patient Time For Goal Achievement: 01/03/21 Potential to Achieve Goals: Good ADL Goals Pt Will Perform Grooming: with supervision;with set-up;standing Pt Will Perform Upper Body  Bathing: with supervision;with set-up;with modified independence Pt Will Perform Lower Body Bathing: with min assist;with min guard assist Pt Will Perform Upper Body Dressing: with supervision;with set-up;with modified independence Pt Will Perform Lower Body Dressing: with min assist;with min guard assist Pt Will Transfer to Toilet: with min assist;with min guard assist;ambulating Pt Will Perform Toileting - Clothing Manipulation and hygiene: with min guard assist;sit to/from stand Pt Will Perform Tub/Shower Transfer: with min guard assist;ambulating  Plan Discharge plan remains appropriate    Co-evaluation                 AM-PAC OT "6 Clicks" Daily Activity     Outcome Measure   Help from another person eating meals?: A Little Help from another person taking care of personal grooming?: A Little Help from another person toileting, which includes using toliet, bedpan, or urinal?: A Little Help from another person bathing (including washing, rinsing, drying)?: A Lot Help from another person to put on and taking off regular upper body clothing?: A Little Help from another person to put on and taking off regular lower body clothing?: A Lot 6 Click Score: 16    End of Session Equipment Utilized During Treatment: Gait belt;Rolling walker  OT Visit Diagnosis: Unsteadiness on feet (R26.81);Other abnormalities of gait and mobility (R26.89);Muscle weakness (generalized) (M62.81);History of falling (Z91.81);Other symptoms and signs involving the nervous system (R29.898);Other symptoms and signs involving cognitive function   Activity Tolerance Patient tolerated treatment well   Patient Left with call bell/phone within reach;in chair;with chair alarm set   Nurse Communication Mobility status        Time: 3149-7026 OT Time Calculation (min): 22 min  Charges: OT General Charges $OT Visit: 1 Visit OT Treatments $Self Care/Home Management : 8-22 mins  Helene Kelp OTR/L Acute  Rehabilitation Services Office: [place] 12/28/2020, 2:34 PM

## 2020-12-28 NOTE — H&P (Signed)
Physical Medicine and Rehabilitation Admission H&P    Chief Complaint  Patient presents with  . Functional deficits due to cervical myelopathy.     HPI:  Ashley T.  [name] is a 60 year old female with history of T2DM, schizophrenia/bipolar d/o, HTN, neck pain, numbness bilateral hands, recent diagnosis of stroke 2/22 due to transient slurred speech and right sided weakness with falls and difficulty walking.   She was evaluated by Dr. Jannifer Franklin 12/17/2020 and found to have quadriparesis therefore C spine MRI as well as CTA head/neck was ordered for work up. Daughters have been assisting for a sometime, have had difficulty caring for patient for a period of time.  She presented to  ED on 12/19/2020 for workup.  She was found to have large right paracentral disc extrusion at C3-4 with severe stenosis and cord compression and signal abnormality c/w spondylotic myelopathy, moderate stenosis C4/5 and severe right foraminal stenosis T1-2.  UDS positive for THC. CTA head/neck was negative for LVO and showed 1.5 cmX 0.9 irregular filling defect in ascending aorta c/w thrombus likey due to recently ruptured plaque and question embolic source.  Echocardiogram with ejection fraction of 57% with mild LVH and negative for thrombus.   Dr. [place]ia Constable consulted and recommended surgical decompression.  DAPT placed on hold and patient underwent ACDF at C3/C4 on 12/25/2020.  Post op cleared to start ASA POD#3 and Plavix POD#7 per NS.  Foley remains in place since surgery. She has had some improvement in symptoms but continues to be limited by right hemiparesis with foot drop, balance deficits as well as decreased awareness of deficits. CIR recommended due to functional decline.  Please see preadmission assessment from earlier today as well.  Review of Systems  Constitutional: Positive for malaise/fatigue. Negative for chills and fever.  HENT: Negative for hearing loss and tinnitus.   Eyes: Negative for blurred vision  and double vision.  Respiratory: Negative for cough and shortness of breath.   Cardiovascular: Negative for chest pain and palpitations.  Gastrointestinal: Positive for constipation. Negative for heartburn and nausea.  Musculoskeletal: Positive for neck pain. Negative for back pain and myalgias.  Skin: Negative for itching and rash.  Neurological: Positive for sensory change, speech change, focal weakness and weakness. Negative for dizziness and headaches.  Psychiatric/Behavioral: The patient is nervous/anxious and has insomnia.   All other systems reviewed and are negative.   Past Medical History:  Diagnosis Date  . Anxiety   . Arthritis   . Bipolar 1 disorder ([place])   . Depression   . Diabetes mellitus without complication ([place])    Type II  . GERD (gastroesophageal reflux disease)   . Heart murmur    "nothing to worry about"  . HTN (hypertension)    not on medication  . Stroke [facility]) 11/2020    Past Surgical History:  Procedure Laterality Date  . ABDOMINAL HYSTERECTOMY    . ANTERIOR CERVICAL DECOMP/DISCECTOMY FUSION N/A 12/25/2020   Procedure: CERVICAL THREE-FOUR ANTERIOR CERVICAL DECOMPRESSION/DISCECTOMY FUSION;  Surgeon: Judith Part, MD;  Location: [facility];  Service: Neurosurgery;  Laterality: N/A;  CERVICAL THREE-FOUR ANTERIOR CERVICAL DECOMPRESSION/DISCECTOMY FUSION   . LUMBAR LAMINECTOMY/DECOMPRESSION MICRODISCECTOMY Left 04/07/2016   Procedure: Left Lumbar four-five Microdiskectomy;  Surgeon: Erline Levine, MD;  Location: [facility];  Service: Neurosurgery;  Laterality: Left;    Family History  Problem Relation Age of Onset  . Diabetes Mellitus II Father   . Diabetes Brother   . Healthy Mother   . Heart  disease Neg Hx   . Stroke Neg Hx   . Kidney disease Neg Hx     Social History:  Lived alone--has been disabled to psychiatric issues.  Daughter have been helping out for a period of time.  Used to work in the post office till 1995. She reports that she has been  smoking cigarettes--1 PPD more if anxious. She has been smoking a "long time"  She has never used smokeless tobacco. She reports current drug use--marijuana "as much as she can get--daily". History of cocaine use in the past.  She reports that she does not drink alcohol.    Allergies: No Known Allergies    Medications Prior to Admission  Medication Sig Dispense Refill  . aspirin EC 81 MG tablet Take 1 tablet (81 mg total) by mouth daily. Swallow whole. 30 tablet 11  . gabapentin (NEURONTIN) 300 MG capsule Take 2 capsules (600 mg total) by mouth 3 (three) times daily. 180 capsule 1  . glipiZIDE (GLUCOTROL XL) 10 MG 24 hr tablet Take 2 tablets (20 mg total) by mouth daily. 90 tablet 0  . insulin detemir (LEVEMIR) 100 UNIT/ML FlexPen Inject 40 Units into the skin daily. 15 mL 11  . Insulin Pen Needle (TRUEPLUS PEN NEEDLES) 32G X 4 MM MISC Use to inject insulin. 100 each 11  . rosuvastatin (CRESTOR) 40 MG tablet One tablet daily (Patient taking differently: Take 40 mg by mouth daily.) 60 tablet 4  . [DISCONTINUED] clopidogrel (PLAVIX) 75 MG tablet Take 1 tablet (75 mg total) by mouth daily. 90 tablet 3  . QUEtiapine (SEROQUEL) 400 MG tablet TAKE ONE TABLET BY MOUTH TWICE DAILY 60 tablet 2    Drug Regimen Review  Drug regimen was reviewed and remains appropriate with no significant issues identified  Home: Home Living Family/patient expects to be discharged to:: Private residence Living Arrangements: Children Available Help at Discharge: Family Type of Home: Apartment Home Access: Stairs to enter Technical brewer of Steps: 12 Entrance Stairs-Rails: Right Home Layout: One level Bathroom Shower/Tub: Chiropodist: Handicapped height Home Equipment: Environmental consultant - 2 wheels,Bedside commode   Functional History: Prior Function Level of Independence: Independent with assistive device(s) Comments: Has been using RW since weakess started.  Functional Status:   Mobility: Bed Mobility Overal bed mobility: Needs Assistance Bed Mobility: Rolling,Sidelying to Sit Rolling: Min assist (using bed rail.) Sidelying to sit: Min assist Supine to sit: Min guard Sit to supine: Supervision Sit to sidelying: Min guard General bed mobility comments: Increased time required. Cues for log roll technique in order to maintain cervical precautions.  Pt twisting despite cues at times. Transfers Overall transfer level: Needs assistance Equipment used: None Transfers: Sit to/from Stand Sit to Stand: Min assist General transfer comment: Min assistance with cues for hand placement to and from seated surface.  pt reaching to pull on RW into standing. Ambulation/Gait Ambulation/Gait assistance: Min assist,Mod assist Gait Distance (Feet): 6 Feet (x2 trials.) Assistive device: Rolling walker (2 wheeled) Gait Pattern/deviations: Step-through pattern,Decreased stride length,Decreased weight shift to left,Trendelenburg General Gait Details: Pt continues with trendelenberg gt on R.  Poor foot clearance on R side.  Limited gt due to drowsiness.  Pt did use RW and able to maintain grip on R side this session.  Continues to require min assistance to turn and back with RW. Gait velocity: Decreased    ADL: ADL Overall ADL's : Needs assistance/impaired Eating/Feeding: Set up,Independent,Sitting Grooming: Wash/dry face,Wash/dry hands,Standing,Min guard Grooming Details (indicate cue type and reason): pt  unsteady Upper Body Bathing: Min guard,Sitting Lower Body Bathing: Minimal assistance,Sitting/lateral leans,Sit to/from stand Upper Body Dressing : Min guard,Sitting Lower Body Dressing: Minimal assistance,Sitting/lateral leans,Sit to/from stand Toilet Transfer: Ambulation,Comfort height toilet,Grab bars,Cueing for safety,Minimal assistance,Min guard,RW Toileting- Clothing Manipulation and Hygiene: Min guard,Sit to/from stand Scientist, research (medical): Minimal  assistance,Ambulation,3 in 1,Grab bars,Cueing for safety Functional mobility during ADLs: Cueing for safety,Cueing for sequencing,Minimal assistance,Min guard,Rolling walker  Cognition: Cognition Overall Cognitive Status: No family/caregiver present to determine baseline cognitive functioning Orientation Level: Oriented X4 Cognition Arousal/Alertness: Awake/alert Behavior During Therapy: WFL for tasks assessed/performed Overall Cognitive Status: No family/caregiver present to determine baseline cognitive functioning General Comments: Pt with decreased awareness of current deficits and safety.   Blood pressure 114/74, pulse 99, temperature 97.7 F (36.5 C), temperature source Oral, resp. rate 16, height 5\' 7"  (1.702 m), weight 65.1 kg, SpO2 98 %. Physical Exam Vitals and nursing note reviewed.  Constitutional:      General: She is not in acute distress.    Appearance: Normal appearance. She is cachectic.     Comments: Sitting in the dark because "she likes it that way"  HENT:     Head: Normocephalic and atraumatic.     Right Ear: External ear normal.     Left Ear: External ear normal.     Nose: Nose normal.  Eyes:     General:        Right eye: No discharge.        Left eye: No discharge.     Extraocular Movements: Extraocular movements intact.  Neck:     Comments: Neck incision Cardiovascular:     Rate and Rhythm: Normal rate and regular rhythm.  Pulmonary:     Effort: Pulmonary effort is normal. No respiratory distress.     Breath sounds: No stridor.  Abdominal:     General: Abdomen is flat. Bowel sounds are normal. There is no distension.  Musculoskeletal:     Comments: No edema or tenderness in extremities  Skin:    General: Skin is warm and dry.     Comments: Neck incision CDI  Neurological:     Mental Status: She is alert.     Comments: Alert and oriented x2 Dysphonia She is able to follow basic commands without difficulty.  Motor: Right upper extremity:  Shoulder abduction 2+/5, distally 3+-4 -/5 Left upper extremity: Shoulder abduction 3/5, distally 3+-, +/5 Bilateral lower extremities: Hip flexion, knee extension 2+/5, ankle dorsiflexion 3/5  Psychiatric:        Speech: Speech is delayed.        Behavior: Behavior is slowed.     Results for orders placed or performed during the hospital encounter of 12/18/20 (from the past 48 hour(s))  Basic metabolic panel     Status: Abnormal   Collection Time: 12/26/20 12:53 PM  Result Value Ref Range   Sodium 135 135 - 145 mmol/L   Potassium 4.0 3.5 - 5.1 mmol/L   Chloride 100 98 - 111 mmol/L   CO2 26 22 - 32 mmol/L   Glucose, Bld 399 (H) 70 - 99 mg/dL    Comment: Glucose reference range applies only to samples taken after fasting for at least 8 hours.   BUN 18 8 - 23 mg/dL   Creatinine, Ser 0.86 0.44 - 1.00 mg/dL   Calcium 9.7 8.9 - 10.3 mg/dL   GFR, Estimated >60 >60 mL/min    Comment: (NOTE) Calculated using the CKD-EPI Creatinine Equation (2021)    Anion gap  9 5 - 15    Comment: Performed at [facility], [place] [address] [place], [place] [address]  Glucose, capillary     Status: Abnormal   Collection Time: 12/26/20  3:56 PM  Result Value Ref Range   Glucose-Capillary 378 (H) 70 - 99 mg/dL    Comment: Glucose reference range applies only to samples taken after fasting for at least 8 hours.  Glucose, capillary     Status: Abnormal   Collection Time: 12/26/20  8:14 PM  Result Value Ref Range   Glucose-Capillary 282 (H) 70 - 99 mg/dL    Comment: Glucose reference range applies only to samples taken after fasting for at least 8 hours.  CBC with Differential/Platelet     Status: Abnormal   Collection Time: 12/27/20  3:53 AM  Result Value Ref Range   WBC 11.5 (H) 4.0 - 10.5 K/uL   RBC 3.82 (L) 3.87 - 5.11 MIL/uL   Hemoglobin 11.4 (L) 12.0 - 15.0 g/dL   HCT 35.9 (L) 36.0 - 46.0 %   MCV 94.0 80.0 - 100.0 fL   MCH 29.8 26.0 - 34.0 pg   MCHC 31.8 30.0 - 36.0 g/dL   RDW 14.6 11.5 -  15.5 %   Platelets 337 150 - 400 K/uL   nRBC 0.0 0.0 - 0.2 %   Neutrophils Relative % 54 %   Neutro Abs 6.2 1.7 - 7.7 K/uL   Lymphocytes Relative 33 %   Lymphs Abs 3.8 0.7 - 4.0 K/uL   Monocytes Relative 8 %   Monocytes Absolute 0.9 0.1 - 1.0 K/uL   Eosinophils Relative 4 %   Eosinophils Absolute 0.4 0.0 - 0.5 K/uL   Basophils Relative 1 %   Basophils Absolute 0.1 0.0 - 0.1 K/uL   Immature Granulocytes 0 %   Abs Immature Granulocytes 0.04 0.00 - 0.07 K/uL    Comment: Performed at [facility], [address], [place], [place] [address]  Basic metabolic panel     Status: Abnormal   Collection Time: 12/27/20  3:53 AM  Result Value Ref Range   Sodium 137 135 - 145 mmol/L   Potassium 4.0 3.5 - 5.1 mmol/L   Chloride 103 98 - 111 mmol/L   CO2 24 22 - 32 mmol/L   Glucose, Bld 193 (H) 70 - 99 mg/dL    Comment: Glucose reference range applies only to samples taken after fasting for at least 8 hours.   BUN 20 8 - 23 mg/dL   Creatinine, Ser 0.96 0.44 - 1.00 mg/dL   Calcium 9.7 8.9 - 10.3 mg/dL   GFR, Estimated >60 >60 mL/min    Comment: (NOTE) Calculated using the CKD-EPI Creatinine Equation (2021)    Anion gap 10 5 - 15    Comment: Performed at [place] [place] [address] [place], [place] [address]  Glucose, capillary     Status: Abnormal   Collection Time: 12/27/20  7:57 AM  Result Value Ref Range   Glucose-Capillary 158 (H) 70 - 99 mg/dL    Comment: Glucose reference range applies only to samples taken after fasting for at least 8 hours.  Glucose, capillary     Status: Abnormal   Collection Time: 12/27/20  1:11 PM  Result Value Ref Range   Glucose-Capillary 185 (H) 70 - 99 mg/dL    Comment: Glucose reference range applies only to samples taken after fasting for at least 8 hours.  Glucose, capillary     Status:  Abnormal   Collection Time: 12/27/20  5:08 PM  Result Value Ref Range   Glucose-Capillary 227 (H) 70 - 99 mg/dL    Comment: Glucose reference range applies  only to samples taken after fasting for at least 8 hours.  Glucose, capillary     Status: Abnormal   Collection Time: 12/27/20  8:54 PM  Result Value Ref Range   Glucose-Capillary 114 (H) 70 - 99 mg/dL    Comment: Glucose reference range applies only to samples taken after fasting for at least 8 hours.  Glucose, capillary     Status: Abnormal   Collection Time: 12/28/20  6:10 AM  Result Value Ref Range   Glucose-Capillary 318 (H) 70 - 99 mg/dL    Comment: Glucose reference range applies only to samples taken after fasting for at least 8 hours.   No results found.     Medical Problem List and Plan: 1.  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  -patient may shower with incision covered  -ELOS/Goals: 14-17 days/Supervision/Min A  Admit to CIR 2.  Antithrombotics: -DVT/anticoagulation:  Pharmaceutical: Lovenox  -antiplatelet therapy: ASA resumed 3/25 and Plavix to be resumed on 03/29 3. Pain Management: Oxycodone prn with gabapentin for neuropathy.   Monitor with increased exertion 4. Mood: LCSW to follow for evaluation and support.   -antipsychotic agents: N/A 5. Neuropsych: This patient is not fully capable of making decisions on her own behalf. 6. Skin/Wound Care: Monitor incision for healing.   --routine pressure relief measures.  7. Fluids/Electrolytes/Nutrition: Monitor I/Os.   CMP ordered 8.T2DM: Hgb A1c-9.5. Monitor BS ac/hs  --Continue Levemir and glucotrol    --Continue SSI for elevated BS--ensure between meals likely causing variation  --Change to ensure max with meals and add CM restrictions to diet.    Monitor with increased mobility 9. Elevated BP: Monitor BP tid. 10. L-MCA CVA: DAPT being resumed per NS recommendations.  --Continue statin 11. Schizophrenia/H/o Bipolar d/o: Managed with Seroquel 12. Constipation: Change senna S to with supper  --had  one BM yesterday per patient.  Will give dose of MOM today  13. Leucocytosis: Likely reactive. Monitor for signs of infection.   CBC ordered  14. ABLA: Post op drop noted.   CBC ordered 15. Urinary retention: Foley placed 03/22 for surgery with 1000 cc return.   --Will order KUB to look for stool burden.   --Plan to remove Foley tomorrow a.m. and start bladder program.   Bary Leriche, PA-C 12/28/2020  I have personally performed a face to face diagnostic evaluation, including, but not limited to relevant history and physical exam findings, of this patient and developed relevant assessment and plan.  Additionally, I have reviewed and concur with the physician assistant's documentation above.  Delice Lesch, MD, ABPMR  The patient's status has not changed. Any changes from the pre-admission screening or documentation from the acute chart are noted above.   Delice Lesch, MD, ABPMR

## 2020-12-28 NOTE — Progress Notes (Signed)
Inpatient Rehab Admissions Coordinator:    I have insurance approval and a bed available for pt to admit to CIR today. Dr. Tawanna Solo in agreement.  Will let pt/family and TOC team know.   Shann Medal, PT, DPT Admissions Coordinator [phone] 12/28/20  10:25 AM

## 2020-12-28 NOTE — H&P (Signed)
Physical Medicine and Rehabilitation Admission H&P    Chief Complaint  Patient presents with  . Functional deficits due to cervical myelopathy.     HPI:  Ashley T.  [name] is a 60 year old female with history of T2DM, schizophrenia/bipolar d/o, HTN, neck pain, numbness bilateral hands, recent diagnosis of stroke 2/22 due to transient slurred speech and right sided weakness with falls and difficulty walking.   She was evaluated by Dr. Jannifer Franklin 12/17/2020 and found to have quadriparesis therefore C spine MRI as well as CTA head/neck was ordered for work up. Daughters have been assisting for a sometime, have had difficulty caring for patient for a period of time.  She presented to  ED on 12/19/2020 for workup.  She was found to have large right paracentral disc extrusion at C3-4 with severe stenosis and cord compression and signal abnormality c/w spondylotic myelopathy, moderate stenosis C4/5 and severe right foraminal stenosis T1-2.  UDS positive for THC. CTA head/neck was negative for LVO and showed 1.5 cmX 0.9 irregular filling defect in ascending aorta c/w thrombus likey due to recently ruptured plaque and question embolic source.  Echocardiogram with ejection fraction of 57% with mild LVH and negative for thrombus.   Dr. Venetia Constable consulted and recommended surgical decompression.  DAPT placed on hold and patient underwent ACDF at C3/C4 on 12/25/2020.  Post op cleared to start ASA POD#3 and Plavix POD#7 per NS.  Foley remains in place since surgery. She has had some improvement in symptoms but continues to be limited by right hemiparesis with foot drop, balance deficits as well as decreased awareness of deficits. CIR recommended due to functional decline.  Please see preadmission assessment from earlier today as well.  Review of Systems  Constitutional: Positive for malaise/fatigue. Negative for chills and fever.  HENT: Negative for hearing loss and tinnitus.   Eyes: Negative for blurred vision  and double vision.  Respiratory: Negative for cough and shortness of breath.   Cardiovascular: Negative for chest pain and palpitations.  Gastrointestinal: Positive for constipation. Negative for heartburn and nausea.  Musculoskeletal: Positive for neck pain. Negative for back pain and myalgias.  Skin: Negative for itching and rash.  Neurological: Positive for sensory change, speech change, focal weakness and weakness. Negative for dizziness and headaches.  Psychiatric/Behavioral: The patient is nervous/anxious and has insomnia.   All other systems reviewed and are negative.   Past Medical History:  Diagnosis Date  . Anxiety   . Arthritis   . Bipolar 1 disorder ([place])   . Depression   . Diabetes mellitus without complication ([place])    Type II  . GERD (gastroesophageal reflux disease)   . Heart murmur    "nothing to worry about"  . HTN (hypertension)    not on medication  . Stroke [facility]) 11/2020    Past Surgical History:  Procedure Laterality Date  . ABDOMINAL HYSTERECTOMY    . ANTERIOR CERVICAL DECOMP/DISCECTOMY FUSION N/A 12/25/2020   Procedure: CERVICAL THREE-FOUR ANTERIOR CERVICAL DECOMPRESSION/DISCECTOMY FUSION;  Surgeon: Judith Part, MD;  Location: [facility];  Service: Neurosurgery;  Laterality: N/A;  CERVICAL THREE-FOUR ANTERIOR CERVICAL DECOMPRESSION/DISCECTOMY FUSION   . LUMBAR LAMINECTOMY/DECOMPRESSION MICRODISCECTOMY Left 04/07/2016   Procedure: Left Lumbar four-five Microdiskectomy;  Surgeon: Erline Levine, MD;  Location: [facility];  Service: Neurosurgery;  Laterality: Left;    Family History  Problem Relation Age of Onset  . Diabetes Mellitus II Father   . Diabetes Brother   . Healthy Mother   . Heart  disease Neg Hx   . Stroke Neg Hx   . Kidney disease Neg Hx     Social History:  Lived alone--has been disabled to psychiatric issues.  Daughter have been helping out for a period of time.  Used to work in the post office till 1995. She reports that she has been  smoking cigarettes--1 PPD more if anxious. She has been smoking a "long time"  She has never used smokeless tobacco. She reports current drug use--marijuana "as much as she can get--daily". History of cocaine use in the past.  She reports that she does not drink alcohol.    Allergies: No Known Allergies    Medications Prior to Admission  Medication Sig Dispense Refill  . aspirin EC 81 MG tablet Take 1 tablet (81 mg total) by mouth daily. Swallow whole. 30 tablet 11  . gabapentin (NEURONTIN) 300 MG capsule Take 2 capsules (600 mg total) by mouth 3 (three) times daily. 180 capsule 1  . glipiZIDE (GLUCOTROL XL) 10 MG 24 hr tablet Take 2 tablets (20 mg total) by mouth daily. 90 tablet 0  . insulin detemir (LEVEMIR) 100 UNIT/ML FlexPen Inject 40 Units into the skin daily. 15 mL 11  . Insulin Pen Needle (TRUEPLUS PEN NEEDLES) 32G X 4 MM MISC Use to inject insulin. 100 each 11  . rosuvastatin (CRESTOR) 40 MG tablet One tablet daily (Patient taking differently: Take 40 mg by mouth daily.) 60 tablet 4  . [DISCONTINUED] clopidogrel (PLAVIX) 75 MG tablet Take 1 tablet (75 mg total) by mouth daily. 90 tablet 3  . QUEtiapine (SEROQUEL) 400 MG tablet TAKE ONE TABLET BY MOUTH TWICE DAILY 60 tablet 2    Drug Regimen Review  Drug regimen was reviewed and remains appropriate with no significant issues identified  Home: Home Living Family/patient expects to be discharged to:: Private residence Living Arrangements: Children Available Help at Discharge: Family Type of Home: Apartment Home Access: Stairs to enter Technical brewer of Steps: 12 Entrance Stairs-Rails: Right Home Layout: One level Bathroom Shower/Tub: Chiropodist: Handicapped height Home Equipment: Environmental consultant - 2 wheels,Bedside commode   Functional History: Prior Function Level of Independence: Independent with assistive device(s) Comments: Has been using RW since weakess started.  Functional Status:   Mobility: Bed Mobility Overal bed mobility: Needs Assistance Bed Mobility: Rolling,Sidelying to Sit Rolling: Min assist (using bed rail.) Sidelying to sit: Min assist Supine to sit: Min guard Sit to supine: Supervision Sit to sidelying: Min guard General bed mobility comments: Increased time required. Cues for log roll technique in order to maintain cervical precautions.  Pt twisting despite cues at times. Transfers Overall transfer level: Needs assistance Equipment used: None Transfers: Sit to/from Stand Sit to Stand: Min assist General transfer comment: Min assistance with cues for hand placement to and from seated surface.  pt reaching to pull on RW into standing. Ambulation/Gait Ambulation/Gait assistance: Min assist,Mod assist Gait Distance (Feet): 6 Feet (x2 trials.) Assistive device: Rolling walker (2 wheeled) Gait Pattern/deviations: Step-through pattern,Decreased stride length,Decreased weight shift to left,Trendelenburg General Gait Details: Pt continues with trendelenberg gt on R.  Poor foot clearance on R side.  Limited gt due to drowsiness.  Pt did use RW and able to maintain grip on R side this session.  Continues to require min assistance to turn and back with RW. Gait velocity: Decreased    ADL: ADL Overall ADL's : Needs assistance/impaired Eating/Feeding: Set up,Independent,Sitting Grooming: Wash/dry face,Wash/dry hands,Standing,Min guard Grooming Details (indicate cue type and reason): pt  unsteady Upper Body Bathing: Min guard,Sitting Lower Body Bathing: Minimal assistance,Sitting/lateral leans,Sit to/from stand Upper Body Dressing : Min guard,Sitting Lower Body Dressing: Minimal assistance,Sitting/lateral leans,Sit to/from stand Toilet Transfer: Ambulation,Comfort height toilet,Grab bars,Cueing for safety,Minimal assistance,Min guard,RW Toileting- Clothing Manipulation and Hygiene: Min guard,Sit to/from stand Scientist, research (medical): Minimal  assistance,Ambulation,3 in 1,Grab bars,Cueing for safety Functional mobility during ADLs: Cueing for safety,Cueing for sequencing,Minimal assistance,Min guard,Rolling walker  Cognition: Cognition Overall Cognitive Status: No family/caregiver present to determine baseline cognitive functioning Orientation Level: Oriented X4 Cognition Arousal/Alertness: Awake/alert Behavior During Therapy: WFL for tasks assessed/performed Overall Cognitive Status: No family/caregiver present to determine baseline cognitive functioning General Comments: Pt with decreased awareness of current deficits and safety.   Blood pressure 114/74, pulse 99, temperature 97.7 F (36.5 C), temperature source Oral, resp. rate 16, height 5\' 7"  (1.702 m), weight 65.1 kg, SpO2 98 %. Physical Exam Vitals and nursing note reviewed.  Constitutional:      General: She is not in acute distress.    Appearance: Normal appearance. She is cachectic.     Comments: Sitting in the dark because "she likes it that way"  HENT:     Head: Normocephalic and atraumatic.     Right Ear: External ear normal.     Left Ear: External ear normal.     Nose: Nose normal.  Eyes:     General:        Right eye: No discharge.        Left eye: No discharge.     Extraocular Movements: Extraocular movements intact.  Neck:     Comments: Neck incision Cardiovascular:     Rate and Rhythm: Normal rate and regular rhythm.  Pulmonary:     Effort: Pulmonary effort is normal. No respiratory distress.     Breath sounds: No stridor.  Abdominal:     General: Abdomen is flat. Bowel sounds are normal. There is no distension.  Musculoskeletal:     Comments: No edema or tenderness in extremities  Skin:    General: Skin is warm and dry.     Comments: Neck incision CDI  Neurological:     Mental Status: She is alert.     Comments: Alert and oriented x2 Dysphonia She is able to follow basic commands without difficulty.  Motor: Right upper extremity:  Shoulder abduction 2+/5, distally 3+-4 -/5 Left upper extremity: Shoulder abduction 3/5, distally 3+-, +/5 Bilateral lower extremities: Hip flexion, knee extension 2+/5, ankle dorsiflexion 3/5  Psychiatric:        Speech: Speech is delayed.        Behavior: Behavior is slowed.     Results for orders placed or performed during the hospital encounter of 12/18/20 (from the past 48 hour(s))  Basic metabolic panel     Status: Abnormal   Collection Time: 12/26/20 12:53 PM  Result Value Ref Range   Sodium 135 135 - 145 mmol/L   Potassium 4.0 3.5 - 5.1 mmol/L   Chloride 100 98 - 111 mmol/L   CO2 26 22 - 32 mmol/L   Glucose, Bld 399 (H) 70 - 99 mg/dL    Comment: Glucose reference range applies only to samples taken after fasting for at least 8 hours.   BUN 18 8 - 23 mg/dL   Creatinine, Ser 0.86 0.44 - 1.00 mg/dL   Calcium 9.7 8.9 - 10.3 mg/dL   GFR, Estimated >60 >60 mL/min    Comment: (NOTE) Calculated using the CKD-EPI Creatinine Equation (2021)    Anion gap  9 5 - 15    Comment: Performed at [facility], [place] [address] [place], [place] [address]  Glucose, capillary     Status: Abnormal   Collection Time: 12/26/20  3:56 PM  Result Value Ref Range   Glucose-Capillary 378 (H) 70 - 99 mg/dL    Comment: Glucose reference range applies only to samples taken after fasting for at least 8 hours.  Glucose, capillary     Status: Abnormal   Collection Time: 12/26/20  8:14 PM  Result Value Ref Range   Glucose-Capillary 282 (H) 70 - 99 mg/dL    Comment: Glucose reference range applies only to samples taken after fasting for at least 8 hours.  CBC with Differential/Platelet     Status: Abnormal   Collection Time: 12/27/20  3:53 AM  Result Value Ref Range   WBC 11.5 (H) 4.0 - 10.5 K/uL   RBC 3.82 (L) 3.87 - 5.11 MIL/uL   Hemoglobin 11.4 (L) 12.0 - 15.0 g/dL   HCT 35.9 (L) 36.0 - 46.0 %   MCV 94.0 80.0 - 100.0 fL   MCH 29.8 26.0 - 34.0 pg   MCHC 31.8 30.0 - 36.0 g/dL   RDW 14.6 11.5 -  15.5 %   Platelets 337 150 - 400 K/uL   nRBC 0.0 0.0 - 0.2 %   Neutrophils Relative % 54 %   Neutro Abs 6.2 1.7 - 7.7 K/uL   Lymphocytes Relative 33 %   Lymphs Abs 3.8 0.7 - 4.0 K/uL   Monocytes Relative 8 %   Monocytes Absolute 0.9 0.1 - 1.0 K/uL   Eosinophils Relative 4 %   Eosinophils Absolute 0.4 0.0 - 0.5 K/uL   Basophils Relative 1 %   Basophils Absolute 0.1 0.0 - 0.1 K/uL   Immature Granulocytes 0 %   Abs Immature Granulocytes 0.04 0.00 - 0.07 K/uL    Comment: Performed at [facility], [address], [place], [place] [address]  Basic metabolic panel     Status: Abnormal   Collection Time: 12/27/20  3:53 AM  Result Value Ref Range   Sodium 137 135 - 145 mmol/L   Potassium 4.0 3.5 - 5.1 mmol/L   Chloride 103 98 - 111 mmol/L   CO2 24 22 - 32 mmol/L   Glucose, Bld 193 (H) 70 - 99 mg/dL    Comment: Glucose reference range applies only to samples taken after fasting for at least 8 hours.   BUN 20 8 - 23 mg/dL   Creatinine, Ser 0.96 0.44 - 1.00 mg/dL   Calcium 9.7 8.9 - 10.3 mg/dL   GFR, Estimated >60 >60 mL/min    Comment: (NOTE) Calculated using the CKD-EPI Creatinine Equation (2021)    Anion gap 10 5 - 15    Comment: Performed at [facility] [address], [place], [place] [address]  Glucose, capillary     Status: Abnormal   Collection Time: 12/27/20  7:57 AM  Result Value Ref Range   Glucose-Capillary 158 (H) 70 - 99 mg/dL    Comment: Glucose reference range applies only to samples taken after fasting for at least 8 hours.  Glucose, capillary     Status: Abnormal   Collection Time: 12/27/20  1:11 PM  Result Value Ref Range   Glucose-Capillary 185 (H) 70 - 99 mg/dL    Comment: Glucose reference range applies only to samples taken after fasting for at least 8 hours.  Glucose, capillary     Status:  Abnormal   Collection Time: 12/27/20  5:08 PM  Result Value Ref Range   Glucose-Capillary 227 (H) 70 - 99 mg/dL    Comment: Glucose reference range applies  only to samples taken after fasting for at least 8 hours.  Glucose, capillary     Status: Abnormal   Collection Time: 12/27/20  8:54 PM  Result Value Ref Range   Glucose-Capillary 114 (H) 70 - 99 mg/dL    Comment: Glucose reference range applies only to samples taken after fasting for at least 8 hours.  Glucose, capillary     Status: Abnormal   Collection Time: 12/28/20  6:10 AM  Result Value Ref Range   Glucose-Capillary 318 (H) 70 - 99 mg/dL    Comment: Glucose reference range applies only to samples taken after fasting for at least 8 hours.   No results found.     Medical Problem List and Plan: 1.  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  -patient may shower with incision covered  -ELOS/Goals: 14-17 days/Supervision/Min A  Admit to CIR 2.  Antithrombotics: -DVT/anticoagulation:  Pharmaceutical: Lovenox  -antiplatelet therapy: ASA resumed 3/25 and Plavix to be resumed on 03/29 3. Pain Management: Oxycodone prn with gabapentin for neuropathy.   Monitor with increased exertion 4. Mood: LCSW to follow for evaluation and support.   -antipsychotic agents: N/A 5. Neuropsych: This patient is not fully capable of making decisions on her own behalf. 6. Skin/Wound Care: Monitor incision for healing.   --routine pressure relief measures.  7. Fluids/Electrolytes/Nutrition: Monitor I/Os.   CMP ordered 8.T2DM: Hgb A1c-9.5. Monitor BS ac/hs  --Continue Levemir and glucotrol    --Continue SSI for elevated BS--ensure between meals likely causing variation  --Change to ensure max with meals and add CM restrictions to diet.    Monitor with increased mobility 9. Elevated BP: Monitor BP tid. 10. L-MCA CVA: DAPT being resumed per NS recommendations.  --Continue statin 11. Schizophrenia/H/o Bipolar d/o: Managed with Seroquel 12. Constipation: Change senna S to with supper  --had  one BM yesterday per patient.  Will give dose of MOM today  13. Leucocytosis: Likely reactive. Monitor for signs of infection.   CBC ordered  14. ABLA: Post op drop noted.   CBC ordered 15. Urinary retention: Foley placed 03/22 for surgery with 1000 cc return.   --Will order KUB to look for stool burden.   --Plan to remove Foley tomorrow a.m. and start bladder program.   Bary Leriche, PA-C 12/28/2020  I have personally performed a face to face diagnostic evaluation, including, but not limited to relevant history and physical exam findings, of this patient and developed relevant assessment and plan.  Additionally, I have reviewed and concur with the physician assistant's documentation above.  Delice Lesch, MD, ABPMR

## 2020-12-28 NOTE — Progress Notes (Signed)
Inpatient Rehabilitation Medication Review by a Pharmacist  A complete drug regimen review was completed for this patient to identify any potential clinically significant medication issues.  Clinically significant medication issues were identified:  no  Check AMION for pharmacist assigned to patient if future medication questions/issues arise during this admission.  Pharmacist comments:   Time spent performing this drug regimen review (minutes):  10    Thank you for allowing [place] to participate in this patients care. Jens Som, PharmD 12/28/2020 7:52 PM  Please check [email or web address] for unit-specific pharmacy phone numbers.

## 2020-12-29 DIAGNOSIS — G8250 Quadriplegia, unspecified: Secondary | ICD-10-CM

## 2020-12-29 DIAGNOSIS — G8918 Other acute postprocedural pain: Secondary | ICD-10-CM

## 2020-12-29 DIAGNOSIS — R339 Retention of urine, unspecified: Secondary | ICD-10-CM

## 2020-12-29 DIAGNOSIS — G959 Disease of spinal cord, unspecified: Secondary | ICD-10-CM

## 2020-12-29 DIAGNOSIS — E1165 Type 2 diabetes mellitus with hyperglycemia: Secondary | ICD-10-CM

## 2020-12-29 DIAGNOSIS — D62 Acute posthemorrhagic anemia: Secondary | ICD-10-CM

## 2020-12-29 LAB — CBC WITH DIFFERENTIAL/PLATELET
Abs Immature Granulocytes: 0.02 10*3/uL (ref 0.00–0.07)
Basophils Absolute: 0.1 10*3/uL (ref 0.0–0.1)
Basophils Relative: 1 %
Eosinophils Absolute: 0.5 10*3/uL (ref 0.0–0.5)
Eosinophils Relative: 5 %
HCT: 32.5 % — ABNORMAL LOW (ref 36.0–46.0)
Hemoglobin: 10.4 g/dL — ABNORMAL LOW (ref 12.0–15.0)
Immature Granulocytes: 0 %
Lymphocytes Relative: 32 %
Lymphs Abs: 3.3 10*3/uL (ref 0.7–4.0)
MCH: 29.7 pg (ref 26.0–34.0)
MCHC: 32 g/dL (ref 30.0–36.0)
MCV: 92.9 fL (ref 80.0–100.0)
Monocytes Absolute: 0.6 10*3/uL (ref 0.1–1.0)
Monocytes Relative: 6 %
Neutro Abs: 5.7 10*3/uL (ref 1.7–7.7)
Neutrophils Relative %: 56 %
Platelets: 326 10*3/uL (ref 150–400)
RBC: 3.5 MIL/uL — ABNORMAL LOW (ref 3.87–5.11)
RDW: 14.2 % (ref 11.5–15.5)
WBC: 10.2 10*3/uL (ref 4.0–10.5)
nRBC: 0 % (ref 0.0–0.2)

## 2020-12-29 LAB — COMPREHENSIVE METABOLIC PANEL
ALT: 60 U/L — ABNORMAL HIGH (ref 0–44)
AST: 41 U/L (ref 15–41)
Albumin: 3.3 g/dL — ABNORMAL LOW (ref 3.5–5.0)
Alkaline Phosphatase: 97 U/L (ref 38–126)
Anion gap: 9 (ref 5–15)
BUN: 23 mg/dL (ref 8–23)
CO2: 26 mmol/L (ref 22–32)
Calcium: 9.6 mg/dL (ref 8.9–10.3)
Chloride: 101 mmol/L (ref 98–111)
Creatinine, Ser: 0.84 mg/dL (ref 0.44–1.00)
GFR, Estimated: >60 mL/min (ref >60)
Glucose, Bld: 108 mg/dL — ABNORMAL HIGH (ref 70–99)
Potassium: 4.1 mmol/L (ref 3.5–5.1)
Sodium: 136 mmol/L (ref 135–145)
Total Bilirubin: 0.7 mg/dL (ref 0.3–1.2)
Total Protein: 6.4 g/dL — ABNORMAL LOW (ref 6.5–8.1)

## 2020-12-29 LAB — GLUCOSE, CAPILLARY
Glucose-Capillary: 104 mg/dL — ABNORMAL HIGH (ref 70–99)
Glucose-Capillary: 127 mg/dL — ABNORMAL HIGH (ref 70–99)
Glucose-Capillary: 216 mg/dL — ABNORMAL HIGH (ref 70–99)
Glucose-Capillary: 169 mg/dL — ABNORMAL HIGH (ref 70–99)

## 2020-12-29 MED ORDER — GABAPENTIN 300 MG PO CAPS
300.0000 mg | ORAL_CAPSULE | Freq: Three times a day (TID) | ORAL | Status: DC
  Administered 2020-12-29 – 2020-12-30 (×2): 300 mg via ORAL
  Filled 2020-12-29 (×6): qty 1

## 2020-12-29 MED ORDER — POLYETHYLENE GLYCOL 3350 17 G PO PACK
17.0000 g | PACK | Freq: Two times a day (BID) | ORAL | Status: DC
  Administered 2020-12-30: 17 g via ORAL
  Filled 2020-12-29 (×5): qty 1

## 2020-12-29 NOTE — Evaluation (Signed)
Occupational Therapy Assessment and Plan  Patient Details  Name: Ashley [name] MRN: [id number] Date of Birth: [birth year]  OT Diagnosis: cognitive deficits, hemiplegia affecting dominant side, muscle weakness (generalized) and decreased activity tolerance, dynamic standing balance impairing ADL/func mobility performance Rehab Potential: Rehab Potential (ACUTE ONLY): Good ELOS: 12 to 14 days   Today's Date: 12/29/2020 OT Individual Time: 0801-0901 OT Individual Time Calculation (min): 60 min   And 27 min  Hospital Problem: Principal Problem:   Cervical myelopathy ([place])   Past Medical History:  Past Medical History:  Diagnosis Date  . Anxiety   . Arthritis   . Bipolar 1 disorder ([place])   . Depression   . Diabetes mellitus without complication ([place])    Type II  . GERD (gastroesophageal reflux disease)   . Heart murmur    "nothing to worry about"  . HTN (hypertension)    not on medication  . Stroke [facility]) 11/2020   Past Surgical History:  Past Surgical History:  Procedure Laterality Date  . ABDOMINAL HYSTERECTOMY    . ANTERIOR CERVICAL DECOMP/DISCECTOMY FUSION N/A 12/25/2020   Procedure: CERVICAL THREE-FOUR ANTERIOR CERVICAL DECOMPRESSION/DISCECTOMY FUSION;  Surgeon: Judith Part, MD;  Location: [place];  Service: Neurosurgery;  Laterality: N/A;  CERVICAL THREE-FOUR ANTERIOR CERVICAL DECOMPRESSION/DISCECTOMY FUSION   . LUMBAR LAMINECTOMY/DECOMPRESSION MICRODISCECTOMY Left 04/07/2016   Procedure: Left Lumbar four-five Microdiskectomy;  Surgeon: Erline Levine, MD;  Location: [place] [facility];  Service: Neurosurgery;  Laterality: Left;    Assessment & Plan Clinical Impression: Patient is a 60 y.o. year old female with history of T2DM, schizophrenia/bipolar d/o, HTN, neck pain, numbness bilateral hands, recent diagnosis of stroke 2/22 due to transient slurred speech and right sided weakness with falls and difficulty walking.   She was evaluated by Dr. Jannifer Franklin 12/17/2020 and found to have  quadriparesis therefore C spine MRI as well as CTA head/neck was ordered for work up. Daughters have been assisting for a sometime, have had difficulty caring for patient for a period of time.  She presented to  ED on 12/19/2020 for workup.  She was found to have large right paracentral disc extrusion at C3-4 with severe stenosis and cord compression and signal abnormality c/w spondylotic myelopathy, moderate stenosis C4/5 and severe right foraminal stenosis T1-2.  UDS positive for THC. CTA head/neck was negative for LVO and showed 1.5 cmX 0.9 irregular filling defect in ascending aorta c/w thrombus likey due to recently ruptured plaque and question embolic source.  Echocardiogram with ejection fraction of 57% with mild LVH and negative for thrombus.   Dr. Venetia Constable consulted and recommended surgical decompression.  DAPT placed on hold and patient underwent ACDF at C3/C4 on 12/25/2020.  Post op cleared to start ASA POD#3 and Plavix POD#7 per NS.  Foley remains in place since surgery. She has had some improvement in symptoms but continues to be limited by right hemiparesis with foot drop, balance deficits as well as decreased awareness of deficits. CIR recommended due to functional decline.  Please see preadmission assessment from earlier today as well Patient transferred to CIR on 12/28/2020 .    Patient currently requires mod with basic self-care skills secondary to muscle weakness, decreased cardiorespiratoy endurance, impaired timing and sequencing, abnormal tone, unbalanced muscle activation, decreased coordination and decreased motor planning, decreased attention, decreased awareness, decreased problem solving, decreased safety awareness, decreased memory and delayed processing and decreased standing balance, hemiplegia and difficulty maintaining precautions.  Prior to hospitalization, patient could complete BADL with modified independent .  Patient  will benefit from skilled intervention to increase  independence with basic self-care skills and increase level of independence with iADL prior to discharge home ind with S PRN.  Anticipate patient will require intermittent supervision and follow up home health.  OT - End of Session Activity Tolerance: Tolerates 10 - 20 min activity with multiple rests Endurance Deficit: Yes Endurance Deficit Description: req freq seated rest breaks and inreased time to complete ADL OT Assessment Rehab Potential (ACUTE ONLY): Good OT Barriers to Discharge: Decreased caregiver support;Incontinence OT Patient demonstrates impairments in the following area(s): Balance;Cognition;Motor;Endurance;Safety;Perception;Skin Integrity;Sensory OT Basic ADL's Functional Problem(s): Eating;Grooming;Bathing;Toileting;Dressing OT Advanced ADL's Functional Problem(s): Simple Meal Preparation;Light Housekeeping OT Transfers Functional Problem(s): Tub/Shower;Toilet OT Additional Impairment(s): Fuctional Use of Upper Extremity OT Plan OT Intensity: Minimum of 1-2 x/day, 45 to 90 minutes OT Frequency: 5 out of 7 days OT Duration/Estimated Length of Stay: 12 to 14 days OT Treatment/Interventions: Balance/vestibular training;Disease mangement/prevention;Neuromuscular re-education;Self Care/advanced ADL retraining;Therapeutic Exercise;Wheelchair propulsion/positioning;UE/LE Strength taining/ROM;Skin care/wound managment;Pain management;DME/adaptive equipment instruction;Community reintegration;Cognitive remediation/compensation;Functional electrical stimulation;Patient/family education;Splinting/orthotics;UE/LE Coordination activities;Visual/perceptual remediation/compensation;Therapeutic Activities;Psychosocial support;Functional mobility training;Discharge planning OT Self Feeding Anticipated Outcome(s): ind OT Basic Self-Care Anticipated Outcome(s): mod I OT Toileting Anticipated Outcome(s): mod I OT Bathroom Transfers Anticipated Outcome(s): mod I OT Recommendation Recommendations  for Other Services: Speech consult;Neuropsych consult;Therapeutic Recreation consult Therapeutic Recreation Interventions: Pet therapy;Kitchen group;Stress management;Outing/community reintergration Patient destination: Home Follow Up Recommendations: Home health OT;Outpatient OT Equipment Recommended: To be determined   OT Evaluation Precautions/Restrictions  Precautions Precautions: Fall;Cervical Restrictions Weight Bearing Restrictions: No General Chart Reviewed: Yes Family/Caregiver Present: No Vital Signs Therapy Vitals Temp: 98.5 F (36.9 C) Pulse Rate: 87 Resp: 18 BP: 108/69 Patient Position (if appropriate): Lying Oxygen Therapy SpO2: 97 % O2 Device: Room Air Pain Pain Assessment Pain Scale: 0-10 Pain Score: 0-No pain Home Living/Prior Functioning Home Living Available Help at Discharge: Family,Available PRN/intermittently Type of Home: Apartment Home Access: Stairs to enter Secretary/administrator of Steps: 12 Entrance Stairs-Rails: Right Home Layout: One level Bathroom Shower/Tub: Engineer, manufacturing systems: Handicapped height  Lives With: Alone IADL History Homemaking Responsibilities: Yes Meal Prep Responsibility: Primary Laundry Responsibility: Primary Cleaning Responsibility: Primary Current License: Yes Occupation: On disability Leisure and Hobbies: playing with her dogs Prior Function Level of Independence: Independent with basic ADLs,Requires assistive device for independence,Independent with transfers,Independent with gait,Independent with homemaking with ambulation (per chart review, using RW prior to hospitalizaiton) Vision Baseline Vision/History: Wears glasses Wears Glasses: At all times Patient Visual Report: No change from baseline Vision Assessment?: Yes Eye Alignment: Within Functional Limits Ocular Range of Motion: Within Functional Limits Alignment/Gaze Preference: Head turned (R) Tracking/Visual Pursuits: Impaired - to be  further tested in functional context Saccades: Decreased speed of saccadic movement;Additional eye shifts occurred during testing Convergence: Impaired (comment) (decreased B eyes, L> R) Visual Fields: Left visual field deficit;Impaired-to be further tested in functional context (Identified object at 65* L visual field, 90* R) Perception  Perception: Impaired (L visual field deficit, R to be further tested in functional context) Praxis Praxis: Intact Cognition Overall Cognitive Status: No family/caregiver present to determine baseline cognitive functioning Arousal/Alertness: Awake/alert Orientation Level: Person;Place;Situation Person: Oriented Place: Oriented Situation: Oriented Year: 2022 Month: March Day of Week: Correct Immediate Memory Recall: Sock;Blue;Bed Memory Recall Sock: Without Cue Memory Recall Blue: Without Cue Memory Recall Bed: Without Cue Attention: Sustained Sustained Attention: Impaired Sustained Attention Impairment: Verbal complex;Functional complex Safety/Judgment: Impaired Comments: No family present to confirm baseline cognition, states she drives despite RUE weakness and family concerns, somewhat impulsive and resistive  to therapist A during transfers. Recalled part of log-roll technique/cervical precautions, but req frequent VCs throughout to maintain Sensation Sensation Light Touch: Appears Intact Hot/Cold: Appears Intact Proprioception: Impaired by gross assessment (impaired RUE management during functional transfers) Stereognosis: Appears Intact Additional Comments: Reports B hand N/T, however able to identify LT to B digits Coordination Gross Motor Movements are Fluid and Coordinated: No Fine Motor Movements are Fluid and Coordinated: Yes Coordination and Movement Description: slowed digit-thumb opposition, grossly WFL for ADL assessed Motor  Motor Motor: Hemiplegia;Other (comment) (generalized weakness) Motor - Skilled Clinical Observations: R  hemi RUE>RLE, limited by generalized weakness, fatigue, and cervical precautions  Trunk/Postural Assessment  Cervical Assessment Cervical Assessment: Exceptions to [facility] (head turned toward R) Thoracic Assessment Thoracic Assessment: Exceptions to [facility] (rounded shoulders) Lumbar Assessment Lumbar Assessment: Exceptions to [facility] (posterior pelvic tilt) Postural Control Postural Control: Within Functional Limits (grossly [place] for ADL)  Balance Balance Balance Assessed: Yes Static Sitting Balance Static Sitting - Balance Support: Feet unsupported;No upper extremity supported Static Sitting - Level of Assistance: 5: Stand by assistance Dynamic Sitting Balance Dynamic Sitting - Balance Support: No upper extremity supported;Feet supported Dynamic Sitting - Level of Assistance: 5: Stand by assistance Static Standing Balance Static Standing - Balance Support: No upper extremity supported Static Standing - Level of Assistance: 5: Stand by assistance Dynamic Standing Balance Dynamic Standing - Balance Support: During functional activity;Bilateral upper extremity supported Dynamic Standing - Level of Assistance: 4: Min assist;5: Stand by assistance Dynamic Standing - Balance Activities: Reaching for weighted objects;Reaching across midline Extremity/Trunk Assessment RUE Assessment RUE Assessment: Exceptions to [facility] Active Range of Motion (AROM) Comments: ~85 degrees shoulder flexion, not full assessd 2/2 cervical precautions RUE Strength RUE Overall Strength: Deficits;Due to precautions RUE Overall Strength Comments: 3+/5 in shoulder flexion LUE Assessment LUE Assessment: Exceptions to [facility] Active Range of Motion (AROM) Comments: ~90 degrees shoulder flexion, not full assessd 2/2 cervical precautions General Strength Comments: 4/5 in shoulder flexion  Care Tool Care Tool Self Care Eating   Eating Assist Level: Supervision/Verbal cueing    Oral Care    Oral Care Assist Level:  Supervision/Verbal cueing    Bathing   Body parts bathed by patient: Right arm;Left arm;Chest;Abdomen;Front perineal area;Buttocks;Right upper leg;Left upper leg;Face Body parts bathed by helper: Right lower leg;Left lower leg   Assist Level: Minimal Assistance - Patient > 75%    Upper Body Dressing(including orthotics)   What is the patient wearing?: Dress;Hospital gown only   Assist Level: Supervision/Verbal cueing    Lower Body Dressing (excluding footwear)   What is the patient wearing?: Incontinence brief Assist for lower body dressing: Moderate Assistance - Patient 50 - 74%    Putting on/Taking off footwear   What is the patient wearing?: Non-skid slipper socks Assist for footwear: Total Assistance - Patient < 25%       Care Tool Toileting Toileting activity   Assist for toileting: Contact Guard/Touching assist     Care Tool Bed Mobility Roll left and right activity   Roll left and right assist level: Supervision/Verbal cueing    Sit to lying activity   Sit to lying assist level: Supervision/Verbal cueing    Lying to sitting edge of bed activity   Lying to sitting edge of bed assist level: Supervision/Verbal cueing     Care Tool Transfers Sit to stand transfer   Sit to stand assist level: Contact Guard/Touching assist    Chair/bed transfer   Chair/bed transfer assist level: Contact Guard/Touching assist  Toilet transfer   Assist Level: Contact Guard/Touching assist     Care Tool Cognition Expression of Ideas and Wants Expression of Ideas and Wants: Without difficulty (complex and basic) - expresses complex messages without difficulty and with speech that is clear and easy to understand   Understanding Verbal and Non-Verbal Content Understanding Verbal and Non-Verbal Content: Usually understands - understands most conversations, but misses some part/intent of message. Requires cues at times to understand   Memory/Recall Ability *first 3 days only  Memory/Recall Ability *first 3 days only: Current season;Location of own room;That he or she is in a hospital/hospital unit;Staff names and faces    Refer to Care Plan for Long Term Goals  SHORT TERM GOAL WEEK 1 OT Short Term Goal 1 (Week 1): Pt will don pants/brief with min A with AE PRN. OT Short Term Goal 2 (Week 1): Pt will complete shower transfer with CGA with AE PRN. OT Short Term Goal 3 (Week 1): Pt will will complete standing ADL for >5 min with CGA. OT Short Term Goal 4 (Week 1): Pt will ind recall at least 3/5 cervical precautions in prep for safe ADL performance.  Recommendations for other services: Neuropsych and Therapeutic Recreation  Pet therapy, Kitchen group and Stress management   Skilled Therapeutic Intervention ADL ADL Eating: Supervision/safety Where Assessed-Eating: Edge of bed Grooming: Supervision/safety Where Assessed-Grooming: Sitting at sink Upper Body Bathing: Supervision/safety Where Assessed-Upper Body Bathing: Wheelchair Lower Body Bathing: Minimal assistance Where Assessed-Lower Body Bathing: Wheelchair Upper Body Dressing: Supervision/safety Where Assessed-Upper Body Dressing: Wheelchair Lower Body Dressing: Moderate assistance Where Assessed-Lower Body Dressing: Wheelchair Toileting: Contact guard Where Assessed-Toileting: Psychologist, educational Method: Squat pivot Tub/Shower Transfer: Metallurgist Method: Engineer, production: Transfer tub bench Mobility  Bed Mobility Bed Mobility: Rolling Left;Left Sidelying to Sit;Supine to Sit Rolling Left: Supervision/Verbal cueing Left Sidelying to Sit: Supervision/Verbal cueing Supine to Sit: Supervision/Verbal cueing Transfers Sit to Stand: Contact Guard/Touching assist Stand to Sit: Contact Guard/Touching assist  AM Session Note: Pt received semi-reclined in bed, stating she had soiled herself, agreeable to OT eval. Reviewed  role of CIR OT, evaluation process, ADL/func mobility retraining, goals for therapy, and safety plan. Evaluation completed as documented above with session focus on seated bathing, dressing, functional mobility, and cervical precautions. Pt initially able to recall the need for log roll technique and 1 cervical precaution, however requiring frequent VCs throughout to maintain. Came to EOB with close S, additionally urinary incont episode. Stand-pivot to w/c with CGA and no AD. Pt adamant to complete transfer with hands off assist. Pt able to complete seated bathing and dressing with above assist levels. Req mod A to thread BLE for LBD. Stand-pivot transfer back to bed with CGA. Pt overall agitated that many hospital staff are coming in and out this date as she prefers to rest, but is highly motivated to get better to go home to see her granddaughter. Pt left semi-reclined in bed with bed alarm engaged, call bell in reach, all immediate needs met.  PM Session [phone]): Pt received seated up in bed finishing lunch, agreeable to OT. Came to EOB with close S and HOB elevated and min VCs for cervical precautions. Stand-pivot no AD to [facility] with CGA, CGA for doffing brief, cont void of urine although noted incontinence in brief, pt aware. Close S for seated pericare. Donned new brief mod A. Amb with RW to sink to wash hands with CGA to min A, noted R  foot drop and loose R grasp on RW. Completed vision assessment as documented above. Pt left in w/c, hand off to PT.    Discharge Criteria: Patient will be discharged from OT if patient refuses treatment 3 consecutive times without medical reason, if treatment goals not met, if there is a change in medical status, if patient makes no progress towards goals or if patient is discharged from hospital.  The above assessment, treatment plan, treatment alternatives and goals were discussed and mutually agreed upon: by patient  Volanda Napoleon MS, OTR/L  12/29/2020,  9:05 AM

## 2020-12-29 NOTE — Plan of Care (Signed)
Problem: RH Balance Goal: LTG: Patient will maintain dynamic sitting balance (OT) Description: LTG:  Patient will maintain dynamic sitting balance with assistance during activities of daily living (OT) Flowsheets (Taken 12/29/2020 1450) LTG: Pt will maintain dynamic sitting balance during ADLs with: Independent with assistive device Goal: LTG Patient will maintain dynamic standing with ADLs (OT) Description: LTG:  Patient will maintain dynamic standing balance with assist during activities of daily living (OT)  Flowsheets (Taken 12/29/2020 1450) LTG: Pt will maintain dynamic standing balance during ADLs with: Independent with assistive device   Problem: Sit to Stand Goal: LTG:  Patient will perform sit to stand in prep for activites of daily living with assistance level (OT) Description: LTG:  Patient will perform sit to stand in prep for activites of daily living with assistance level (OT) Flowsheets (Taken 12/29/2020 1450) LTG: PT will perform sit to stand in prep for activites of daily living with assistance level: Independent with assistive device   Problem: RH Eating Goal: LTG Patient will perform eating w/assist, cues/equip (OT) Description: LTG: Patient will perform eating with assist, with/without cues using equipment (OT) Flowsheets (Taken 12/29/2020 1450) LTG: Pt will perform eating with assistance level of: Independent   Problem: RH Grooming Goal: LTG Patient will perform grooming w/assist,cues/equip (OT) Description: LTG: Patient will perform grooming with assist, with/without cues using equipment (OT) Flowsheets (Taken 12/29/2020 1450) LTG: Pt will perform grooming with assistance level of: Independent with assistive device    Problem: RH Bathing Goal: LTG Patient will bathe all body parts with assist levels (OT) Description: LTG: Patient will bathe all body parts with assist levels (OT) Flowsheets (Taken 12/29/2020 1450) LTG: Pt will perform bathing with assistance  level/cueing: Independent with assistive device    Problem: RH Dressing Goal: LTG Patient will perform upper body dressing (OT) Description: LTG Patient will perform upper body dressing with assist, with/without cues (OT). Flowsheets (Taken 12/29/2020 1450) LTG: Pt will perform upper body dressing with assistance level of: Independent with assistive device Goal: LTG Patient will perform lower body dressing w/assist (OT) Description: LTG: Patient will perform lower body dressing with assist, with/without cues in positioning using equipment (OT) Flowsheets (Taken 12/29/2020 1450) LTG: Pt will perform lower body dressing with assistance level of: Independent with assistive device   Problem: RH Toileting Goal: LTG Patient will perform toileting task (3/3 steps) with assistance level (OT) Description: LTG: Patient will perform toileting task (3/3 steps) with assistance level (OT)  Flowsheets (Taken 12/29/2020 1450) LTG: Pt will perform toileting task (3/3 steps) with assistance level: Independent with assistive device   Problem: RH Functional Use of Upper Extremity Goal: LTG Patient will use RT/LT upper extremity as a (OT) Description: LTG: Patient will use right/left upper extremity as a stabilizer/gross assist/diminished/nondominant/dominant level with assist, with/without cues during functional activity (OT) Flowsheets (Taken 12/29/2020 1450) LTG: Use of upper extremity in functional activities: RUE as nondominant level LTG: Pt will use upper extremity in functional activity with assistance level of: Independent with assistive device   Problem: RH Simple Meal Prep Goal: LTG Patient will perform simple meal prep w/assist (OT) Description: LTG: Patient will perform simple meal prep with assistance, with/without cues (OT). Flowsheets (Taken 12/29/2020 1450) LTG: Pt will perform simple meal prep with assistance level of: Independent with assistive device   Problem: RH Light Housekeeping Goal:  LTG Patient will perform light housekeeping w/assist (OT) Description: LTG: Patient will perform light housekeeping with assistance, with/without cues (OT). Flowsheets (Taken 12/29/2020 1450) LTG: Pt will perform light housekeeping  with assistance level of: Independent with assistive device   Problem: RH Toilet Transfers Goal: LTG Patient will perform toilet transfers w/assist (OT) Description: LTG: Patient will perform toilet transfers with assist, with/without cues using equipment (OT) Flowsheets (Taken 12/29/2020 1450) LTG: Pt will perform toilet transfers with assistance level of: Independent with assistive device   Problem: RH Tub/Shower Transfers Goal: LTG Patient will perform tub/shower transfers w/assist (OT) Description: LTG: Patient will perform tub/shower transfers with assist, with/without cues using equipment (OT) Flowsheets (Taken 12/29/2020 1450) LTG: Pt will perform tub/shower stall transfers with assistance level of: Independent with assistive device

## 2020-12-29 NOTE — Progress Notes (Signed)
Physical Therapy Assessment and Plan  Patient Details  Name: Ashley [name] MRN: [id number] Date of Birth: 1961/05/23  PT Diagnosis: Abnormality of gait, Hemiparesis dominant and Hypotonia Rehab Potential: Good ELOS: 7 days   Today's Date: 12/29/2020 PT Individual Time: 1330-1400,  1545-1620  PT Individual Time Calculation (min): 30, 35 min   Missed tx time- 25 min due to fatigue  Hospital Problem: Principal Problem:   Cervical myelopathy ([place]) Active Problems:   Postoperative pain   Past Medical History:  Past Medical History:  Diagnosis Date  . Anxiety   . Arthritis   . Bipolar 1 disorder ([place])   . Depression   . Diabetes mellitus without complication ([place])    Type II  . GERD (gastroesophageal reflux disease)   . Heart murmur    "nothing to worry about"  . HTN (hypertension)    not on medication  . Stroke [facility]) 11/2020   Past Surgical History:  Past Surgical History:  Procedure Laterality Date  . ABDOMINAL HYSTERECTOMY    . ANTERIOR CERVICAL DECOMP/DISCECTOMY FUSION N/A 12/25/2020   Procedure: CERVICAL THREE-FOUR ANTERIOR CERVICAL DECOMPRESSION/DISCECTOMY FUSION;  Surgeon: Ashley [name];  Location: [place];  Service: Neurosurgery;  Laterality: N/A;  CERVICAL THREE-FOUR ANTERIOR CERVICAL DECOMPRESSION/DISCECTOMY FUSION   . LUMBAR LAMINECTOMY/DECOMPRESSION MICRODISCECTOMY Left 04/07/2016   Procedure: Left Lumbar four-five Microdiskectomy;  Surgeon: Ashley [name];  Location: [place] [facility];  Service: Neurosurgery;  Laterality: Left;    Assessment & Plan Clinical Impression:  Ashley T.  [name] is a 60 year old female with history of T2DM, schizophrenia/bipolar d/o, HTN, neck pain, numbness bilateral hands, recent diagnosis of stroke 2/22 due to transient slurred speech and right sided weakness with falls and difficulty walking.   She was evaluated by Dr. Jannifer Franklin 12/17/2020 and found to have quadriparesis therefore C spine MRI as well as CTA head/neck was ordered for  work up. Daughters have been assisting for a sometime, have had difficulty caring for patient for a period of time.  She presented to  ED on 12/19/2020 for workup.  She was found to have large right paracentral disc extrusion at C3-4 with severe stenosis and cord compression and signal abnormality c/w spondylotic myelopathy, moderate stenosis C4/5 and severe right foraminal stenosis T1-2.  UDS positive for THC. CTA head/neck was negative for LVO and showed 1.5 cmX 0.9 irregular filling defect in ascending aorta c/w thrombus likey due to recently ruptured plaque and question embolic source.  Echocardiogram with ejection fraction of 57% with mild LVH and negative for thrombus.   Dr. Venetia [name] consulted and recommended surgical decompression.  DAPT placed on hold and patient underwent ACDF at C3/C4 on 12/25/2020.  Post op cleared to start ASA POD#3 and Plavix POD#7 per NS.  Foley remains in place since surgery. She has had some improvement in symptoms but continues to be limited by right hemiparesis with foot drop, balance deficits as well as decreased awareness of deficits. CIR recommended due to functional decline.Patient transferred to CIR on 12/28/2020 .   Patient currently requires min with mobility secondary to post-op cervical precautions, impaired timing and sequencing and abnormal tone and decreased standing balance, hemiplegia, decreased balance strategies and difficulty maintaining precautions.  Prior to hospitalization, patient was modified independent  with mobility and lived with Alone (dtr hopes that pt will move in wiht her at d/c) in a [place] home.  Home access is 12Stairs to enter, at pt's apartment; unknown how many steps at dtr's apt.  Patient will benefit  from skilled PT intervention to maximize safe functional mobility, minimize fall risk and decrease caregiver burden for planned discharge home with 24 hour supervision.  Anticipate patient will benefit from follow up OP at discharge.  PT  - End of Session Activity Tolerance: Tolerates < 10 min activity, no significant change in vital signs Endurance Deficit: Yes Endurance Deficit Description: depending upon when pt takes Seroquol PT Assessment Rehab Potential (ACUTE/IP ONLY): Good PT Patient demonstrates impairments in the following area(s): Balance;Endurance;Sensory;Safety;Motor PT Transfers Functional Problem(s): Bed Mobility;Bed to Chair;Car;Furniture PT Locomotion Functional Problem(s): Ambulation;Stairs PT Plan PT Intensity: Minimum of 1-2 x/day ,45 to 90 minutes PT Frequency: 5 out of 7 days PT Duration Estimated Length of Stay: 7 days PT Treatment/Interventions: Ambulation/gait training;Cognitive remediation/compensation;Discharge planning;DME/adaptive equipment instruction;Functional mobility training;Pain management;Psychosocial support;Splinting/orthotics;Therapeutic Activities;UE/LE Strength taining/ROM;Balance/vestibular training;Community reintegration;Neuromuscular re-education;Patient/family education;Stair training;Therapeutic Exercise;UE/LE Coordination activities;Wheelchair propulsion/positioning PT Transfers Anticipated Outcome(s): modified independent basic; supervision car PT Locomotion Anticipated Outcome(s): modified independent household gait x 50', S controlled and community x 150', S 12 steps 1 rail PT Recommendation Follow Up Recommendations: Outpatient PT Patient destination: Home Equipment Details: owns a RW   PT Evaluation Precautions/Restrictions- fall, cervical precautions   General PT Amount of Missed Time (min): 25 Minutes PT Missed Treatment Reason: Patient fatigue Vital Signs  Pain tx 1: none; tx 2: 9/10 , although pt stated it was mild R hand pain.  PT informed Caryl Pina, Therapist, sports.   Home Living/Prior Functioning Home Living Available Help at Discharge: Family;Available PRN/intermittently Type of Home: Apartment Home Access: Stairs to enter Entrance Stairs-Number of Steps: 12 Entrance  Stairs-Rails: Right Home Layout: One level Bathroom Shower/Tub: Chiropodist: Handicapped height  Lives With: Alone (dtr hopes that pt will move in wiht her at d/c) Prior Function Level of Independence: Independent with basic ADLs;Requires assistive device for independence;Independent with transfers;Independent with gait;Independent with homemaking with ambulation (per chart review, using RW prior to hospitalizaiton)  Able to Take Stairs?: Yes Driving: Yes Vocation: Retired Vision/Perception  Pt reported that she wears glasses at all times but they are not at the hospital.  She reported no change in vision. Pt noted to have head turned slightly to R despite cues for cervical precautions.  Per OT assessment: Vision - Assessment Eye Alignment: Within Functional Limits Ocular Range of Motion: Within Functional Limits Alignment/Gaze Preference: Head turned (R) Tracking/Visual Pursuits: Impaired - to be further tested in functional context Saccades: Decreased speed of saccadic movement;Additional eye shifts occurred during testing Convergence: Impaired (comment) (decreased B eyes, L> R) Perception Perception: Impaired (L visual field deficit, R to be further tested in functional context) Praxis Praxis: Intact  Cognition A and O x 4.  She remembered 3 words from OT assessment several hours previously.  Able to state cervical precautions, but not follow them during function.  Followed multistep commands during session.   Sensation Sensation Light Touch: Appears Intact Proprioception: Impaired by gross assessment (diminshed R great toe) Coordination Gross Motor Movements are Fluid and Coordinated: No Fine Motor Movements are Fluid and Coordinated: No Heel Shin Test: limited speed, acccuracy, excursion LLE Motor  Motor Motor: Hemiplegia;Abnormal tone (RLE hypotonic) Motor - Skilled Clinical Observations: mild hypotonicity RLE   Trunk/Postural Assessment  Cervical  Assessment Cervical Assessment: Exceptions to [facility] (not checked due to cervical precautions) Thoracic Assessment Thoracic Assessment: Within Functional Limits Lumbar Assessment Lumbar Assessment: Within Functional Limits Postural Control Postural Control: Within Functional Limits  Balance Balance Balance Assessed: Yes Static Sitting Balance Static Sitting - Balance Support: No upper  extremity supported Static Sitting - Level of Assistance: 5: Stand by assistance Dynamic Sitting Balance Dynamic Sitting - Balance Support: No upper extremity supported;Feet supported Dynamic Sitting - Level of Assistance: 5: Stand by assistance Dynamic Sitting - Balance Activities: Lateral lean/weight shifting;Forward lean/weight shifting Static Standing Balance Static Standing - Balance Support: No upper extremity supported Static Standing - Level of Assistance: 5: Stand by assistance Dynamic Standing Balance Dynamic Standing - Balance Support: No upper extremity supported Dynamic Standing - Level of Assistance: 5: Stand by assistance Dynamic Standing - Balance Activities: Lateral lean/weight shifting (to L only) Dynamic Standing - Comments: pt declined attempting lateral R or forward leans Extremity Assessment   RLE Assessment RLE Assessment: Exceptions to [facility] General Strength Comments: grossly in sitting: 3-/5 hip flexion, hip abduction, 4/5 hip adduction;  4/5 knee extension and ankle DF LLE Assessment LLE Assessment: Within Functional Limits General Strength Comments: grossly in sitting 5/5 hip flexion, hip adduction, knee extension, ankle DF; 4/5 hip abduction  Care Tool Care Tool Bed Mobility Roll left and right activity   Roll left and right assist level: Supervision/Verbal cueing    Sit to lying activity Sit to lying activity did not occur: Refused      Lying to sitting edge of bed activity   Lying to sitting edge of bed assist level: Supervision/Verbal cueing     Care Tool  Transfers Sit to stand transfer   Sit to stand assist level: Supervision/Verbal cueing    Chair/bed transfer   Chair/bed transfer assist level: Supervision/Verbal cueing     Toilet transfer   Assist Level: Supervision/Verbal cueing    Car transfer   Car transfer assist level: Supervision/Verbal cueing      Care Tool Locomotion Ambulation   Assist level: Contact Guard/Touching assist Assistive device: No Device Max distance: 150  Walk 10 feet activity   Assist level: Contact Guard/Touching assist Assistive device: No Device   Walk 50 feet with 2 turns activity   Assist level: Contact Guard/Touching assist Assistive device: No Device  Walk 150 feet activity   Assist level: Contact Guard/Touching assist Assistive device: No Device  Walk 10 feet on uneven surfaces activity   Assist level: Minimal Assistance - Patient > 75% Assistive device: Other (comment) (none)  Stairs   Assist level: Minimal Assistance - Patient > 75% Stairs assistive device: 1 hand rail Max number of stairs: 12  Walk up/down 1 step activity   Walk up/down 1 step (curb) assist level: Minimal Assistance - Patient > 75% Walk up/down 1 step or curb assistive device: 1 hand rail    Walk up/down 4 steps activity Walk up/down 4 steps assist level: Minimal Assistance - Patient > 75% Walk up/down 4 steps assistive device: 1 hand rail  Walk up/down 12 steps activity   Walk up/down 12 steps assist level: Minimal Assistance - Patient > 75% Walk up/down 12 steps assistive device: 1 hand rail  Pick up small objects from floor   Pick up small object from the floor assist level: Minimal Assistance - Patient > 75% Pick up small object from the floor assistive device: none  Wheelchair Will patient use wheelchair at discharge?: No   Wheelchair activity did not occur: N/A      Wheel 50 feet with 2 turns activity      Wheel 150 feet activity        Refer to Care Plan for Long Term Goals  SHORT TERM GOAL WEEK  1 PT Short Term Goal 1 (Week  1): = LTGs due to ELOS  Recommendations for other services: None   Skilled Therapeutic Intervention Pt was asleep but easily awakened. She initially refused tx , stating that she was told she not have any more tx today.   Pt stated that she would not be able to participate until her meds had "settled down", suggesting PT return at "2pm or later. ". RN stated that pt had just had her Seroquil, and was taking a very large dose.  RN intended to speak with [name] about pt's dosage.  As PT left room, pt asleep in bed, with bed alarm on and needs at hand.  tx 1:  Pt seen in her room.  Eval initiated.  Bedside neuromuscular re-education in supine via demo and multimodal cues for bil alternating ankle pumps; sitting EOB for reciprocal scooting, R long arc quad knee extensions, bil heel/toe raises.  In standing, mini squats with bil UE support.  Pt able to state cervical precautions, but mod cues needed throughout movement and conversation to avoid cervical movements.  Pt had a phone call with one of her daughters, who requested that pt move in with her after d/c.  Pt was receptive to this, but stated that they would have to talk about it further.  At end of session, PT informed pt when her next session would be.  Bed alarm set and needs left at hand.   tx 2:  Pt had been moved to [facility].  Pt in bed asleep but easily awakened.  Pt stated that she was "depressed" and became tearful during session.  PT provided emotional support.  Pt misses her 2 dogs, and wants to d/c as soon as possible.  PT educated pt on importance of her cervical precautions regarding safe d/c.  Pt verbalized understanding. Pt continued to need mod cues to observe cervical precautions.  During gait evaluation, pt noted to have mild hyperextension with full wt bearing, narrow BOS, externally rotated R hip.  Pt reported that R knee problem was long term, from childhood, but did not know why.  Pt stated that she had fallen often  as a child but eventually "learned how to walk without falling". Neuromuscular re-education for dynamic standing balance limited by pt's willingness to address this, as she believes it won't change.  Pt has poor understanding of recent CVA, stating that her RUE and RLE weakness and sensory changes are from her cervical spine.  Pt reported need to use toilet.  Gait training without AD in room to toilet, CGA.  Toilet transfer with CGA; total assistance for clothing mgt.  Pt's gown wet with urine.  Pt sat on toilet with CGA.  Caryl Pina, RN entered room to complete toileting with pt.      Discharge Criteria: Patient will be discharged from PT if patient refuses treatment 3 consecutive times without medical reason, if treatment goals not met, if there is a change in medical status, if patient makes no progress towards goals or if patient is discharged from hospital.  The above assessment, treatment plan, treatment alternatives and goals were discussed and mutually agreed upon: by patient  Nalanie Winiecki 12/29/2020, 6:07 PM

## 2020-12-29 NOTE — Progress Notes (Signed)
**Ashley Ashley** Ashley Ashley  Subjective/Complaints: Patient seen sitting up in bed this morning, she states she slept fairly well overnight due to adjusting to a new place.  She states she is ready to begin therapies.  Discussed using IV.  ROS: Denies CP, SOB, N/V/D  Objective: Vital Signs: Blood pressure 108/69, pulse 87, temperature 98.5 F (36.9 C), resp. rate 18, height 5\' 7"  (1.702 m), weight 65.1 kg, SpO2 97 %. No results found. Recent Labs    12/27/20 0353 12/29/20 0520  WBC 11.5* 10.2  HGB 11.4* 10.4*  HCT 35.9* 32.5*  PLT 337 326   Recent Labs    12/27/20 0353 12/29/20 0520  NA 137 136  K 4.0 4.1  CL 103 101  CO2 24 26  GLUCOSE 193* 108*  BUN 20 23  CREATININE 0.96 0.84  CALCIUM 9.7 9.6    Intake/Output Summary (Last 24 hours) at 12/29/2020 1329 Last data filed at 12/29/2020 7416 Gross per 24 hour  Intake 120 ml  Output 900 ml  Net -780 ml        Physical Exam: BP 108/69 (BP Location: Right Arm)   Pulse 87   Temp 98.5 F (36.9 C)   Resp 18   Ht 5\' 7"  (1.702 m)   Wt 65.1 kg   SpO2 97%   BMI 22.48 kg/m  Constitutional: No distress . Vital signs reviewed. HENT: Normocephalic.  Atraumatic. Neck: Incision Eyes: EOMI. No discharge. Cardiovascular: No JVD.  RRR. Respiratory: Normal effort.  No stridor.  Bilateral clear to auscultation. GI: Non-distended.  BS +. Skin: Warm and dry.  Neck incision CDI Psych: Slowed.  Delayed. Musc: No edema in extremities.  No tenderness in extremities. Neuro: Alert and oriented Dysphonia Motor: Right upper extremity: Shoulder abduction 2+/5, distally 3+-4 -/5, unchanged Left upper extremity: Shoulder abduction 3/5, distally 3+/5, unchanged Bilateral lower extremities: Hip flexion, knee extension 2+/5, ankle dorsiflexion 3/5   Assessment/Plan: 1. Functional deficits which require 3+ hours per day of interdisciplinary therapy in a comprehensive inpatient rehab  setting.  Physiatrist is providing close team supervision and 24 hour management of active medical problems listed below.  Physiatrist and rehab team continue to assess barriers to discharge/monitor patient progress toward functional and medical goals   Care Tool:  Bathing    Body parts bathed by patient: Right arm,Left arm,Chest,Abdomen,Front perineal area,Buttocks,Right upper leg,Left upper leg,Face   Body parts bathed by helper: Right lower leg,Left lower leg     Bathing assist Assist Level: Minimal Assistance - Patient > 75%     Upper Body Dressing/Undressing Upper body dressing   What is the patient wearing?: Dress,Hospital gown only    Upper body assist Assist Level: Supervision/Verbal cueing    Lower Body Dressing/Undressing Lower body dressing      What is the patient wearing?: Incontinence brief     Lower body assist Assist for lower body dressing: Moderate Assistance - Patient 50 - 74%     Toileting Toileting    Toileting assist Assist for toileting: Contact Guard/Touching assist     Transfers Chair/bed transfer  Transfers assist     Chair/bed transfer assist level: Contact Guard/Touching assist     Locomotion Ambulation   Ambulation assist              Walk 10 feet activity   Assist           Walk 50 feet activity   Assist  Walk 150 feet activity   Assist           Walk 10 feet on uneven surface  activity   Assist           Wheelchair     Assist               Wheelchair 50 feet with 2 turns activity    Assist            Wheelchair 150 feet activity     Assist           Medical Problem List and Plan: 1.  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  Begin CIR evaluations 2.  Antithrombotics: -DVT/anticoagulation:  Pharmaceutical: Lovenox              -antiplatelet therapy: ASA resumed 3/25 and Plavix to be resumed on 03/29 3. Pain Management: Oxycodone prn   Gabapentin for neuropathy decrease to 300 3 times daily per patient preference due to "grogginess"..   Monitor with increased exertion  4. Mood: LCSW to follow for evaluation and support.              -antipsychotic agents: N/A 5. Neuropsych: This patient is not fully capable of making decisions on her own behalf. 6. Skin/Wound Care: Monitor incision for healing.              --routine pressure relief measures.  7. Fluids/Electrolytes/Nutrition: Monitor I/Os.   BMP within acceptable range on 3/26 8.T2DM with hyperglycemia: Hgb A1c-9.5. Monitor BS ac/hs             --Continue Levemir and glucotrol               --Continue SSI for elevated BS--ensure between meals likely causing variation             --Change to ensure max with meals and add CM restrictions to diet.               Monitor with increased mobility 9. Elevated BP: Monitor BP tid.  ?  Resolved 10. L-MCA CVA: DAPT being resumed per NS recommendations.             --Continue statin 11. Schizophrenia/H/o Bipolar d/o: Managed with Seroquel 12.  Slow transit constipation: Change senna S to with supper            Bowel meds increased on 3/26 13. Leucocytosis: Resolved 14. ABLA: Post op drop noted.              Hemoglobin 10.4 on 3/26, continue to monitor 15. Urinary retention: Foley placed 03/22 for surgery with 1000 cc return.   Foley [place]  I/O cath as necessary  LOS: 1 days A FACE TO FACE EVALUATION WAS PERFORMED  Ashley Ashley Ashley Ashley 12/29/2020, 1:29 PM

## 2020-12-29 NOTE — Progress Notes (Signed)
Patient refused gabapentin this shift due to stating that the medication makes her feel sick. MD informed. Peripheral IV to right forearm removed per verbal order from MD Posey Pronto. Nurse informed by therapy after patient had received straight order of Seroquel and PRN dose of Oxy that patient presented very lethargic and could not participate in therapy. MD Posey Pronto informed.  Sanda Linger, LPN

## 2020-12-29 NOTE — Plan of Care (Signed)
  Problem: Consults Goal: Diabetes Guidelines if Diabetic/Glucose > 140 Description: If diabetic or lab glucose is > 140 mg/dl - Initiate Diabetes/Hyperglycemia Guidelines & Document Interventions  Outcome: Progressing   Problem: RH BOWEL ELIMINATION Goal: RH STG MANAGE BOWEL WITH ASSISTANCE Description: STG Manage Bowel with Assistance. Outcome: Progressing Goal: RH STG MANAGE BOWEL W/MEDICATION W/ASSISTANCE Description: STG Manage Bowel with Medication with Assistance. Outcome: Progressing   Problem: RH BLADDER ELIMINATION Goal: RH STG MANAGE BLADDER WITH ASSISTANCE Description: STG Manage Bladder With Assistance Outcome: Progressing Goal: RH STG MANAGE BLADDER WITH MEDICATION WITH ASSISTANCE Description: STG Manage Bladder With Medication With Assistance. Outcome: Progressing Goal: RH STG MANAGE BLADDER WITH EQUIPMENT WITH ASSISTANCE Description: STG Manage Bladder With Equipment With Assistance Outcome: Progressing   Problem: RH SKIN INTEGRITY Goal: RH STG SKIN FREE OF INFECTION/BREAKDOWN Outcome: Progressing Goal: RH STG MAINTAIN SKIN INTEGRITY WITH ASSISTANCE Description: STG Maintain Skin Integrity With Assistance. Outcome: Progressing   Problem: RH SAFETY Goal: RH STG ADHERE TO SAFETY PRECAUTIONS W/ASSISTANCE/DEVICE Description: STG Adhere to Safety Precautions With Assistance/Device. Outcome: Progressing Goal: RH STG DECREASED RISK OF FALL WITH ASSISTANCE Description: STG Decreased Risk of Fall With Assistance. Outcome: Progressing   Problem: RH PAIN MANAGEMENT Goal: RH STG PAIN MANAGED AT OR BELOW PT'S PAIN GOAL Outcome: Progressing   Problem: RH KNOWLEDGE DEFICIT GENERAL Goal: RH STG INCREASE KNOWLEDGE OF SELF CARE AFTER HOSPITALIZATION Outcome: Progressing

## 2020-12-29 NOTE — Progress Notes (Signed)
Patient claimed that she could not find her phone charger. She was moved from 4MW13 today to 4C04. Phone charger not in her room and day rn called to 4MW to locate her charger but they cannot find it. Patient also was complaining of her sheets and didn't want to sleep in the bed since it is not cotton sheets. RN requested for hypoallergenic sheets and she was ok with it. Patient also wanted not to be woken up tonight for a good night sleep. Will continue to monitor.

## 2020-12-30 DIAGNOSIS — F3130 Bipolar disorder, current episode depressed, mild or moderate severity, unspecified: Secondary | ICD-10-CM

## 2020-12-30 DIAGNOSIS — K5901 Slow transit constipation: Secondary | ICD-10-CM

## 2020-12-30 DIAGNOSIS — R32 Unspecified urinary incontinence: Secondary | ICD-10-CM

## 2020-12-30 DIAGNOSIS — F209 Schizophrenia, unspecified: Secondary | ICD-10-CM

## 2020-12-30 DIAGNOSIS — E1165 Type 2 diabetes mellitus with hyperglycemia: Secondary | ICD-10-CM

## 2020-12-30 LAB — GLUCOSE, CAPILLARY
Glucose-Capillary: 173 mg/dL — ABNORMAL HIGH (ref 70–99)
Glucose-Capillary: 65 mg/dL — ABNORMAL LOW (ref 70–99)
Glucose-Capillary: 105 mg/dL — ABNORMAL HIGH (ref 70–99)
Glucose-Capillary: 134 mg/dL — ABNORMAL HIGH (ref 70–99)

## 2020-12-30 LAB — URINALYSIS, COMPLETE (UACMP) WITH MICROSCOPIC
Bacteria, UA: NONE SEEN
Bilirubin Urine: NEGATIVE
Glucose, UA: NEGATIVE mg/dL
Hgb urine dipstick: NEGATIVE
Ketones, ur: NEGATIVE mg/dL
Leukocytes,Ua: NEGATIVE
Nitrite: NEGATIVE
Protein, ur: NEGATIVE mg/dL
Specific Gravity, Urine: 1.023 (ref 1.005–1.030)
pH: 5 (ref 5.0–8.0)

## 2020-12-30 MED ORDER — INSULIN DETEMIR 100 UNIT/ML ~~LOC~~ SOLN
10.0000 [IU] | Freq: Every day | SUBCUTANEOUS | Status: DC
  Administered 2020-12-30 – 2021-01-01 (×3): 10 [IU] via SUBCUTANEOUS
  Filled 2020-12-30 (×5): qty 0.1

## 2020-12-30 MED ORDER — DULOXETINE HCL 20 MG PO CPEP
20.0000 mg | ORAL_CAPSULE | Freq: Every day | ORAL | Status: DC
  Administered 2020-12-30 – 2021-01-01 (×3): 20 mg via ORAL
  Filled 2020-12-30 (×4): qty 1

## 2020-12-30 NOTE — Progress Notes (Addendum)
Patient refused vitals this morning. MD aware. Also informed MD about patient wanting to go home to take care of her pets.

## 2020-12-30 NOTE — Plan of Care (Signed)
  Problem: Consults Goal: Diabetes Guidelines if Diabetic/Glucose > 140 Description: If diabetic or lab glucose is > 140 mg/dl - Initiate Diabetes/Hyperglycemia Guidelines & Document Interventions  Outcome: Progressing   Problem: RH BLADDER ELIMINATION Goal: RH STG MANAGE BLADDER WITH ASSISTANCE Description: STG Manage Bladder With mod I Assistance Outcome: Progressing Goal: RH STG MANAGE BLADDER WITH MEDICATION WITH ASSISTANCE Description: STG Manage Bladder With Medication With mod I Assistance. Outcome: Progressing Goal: RH STG MANAGE BLADDER WITH EQUIPMENT WITH ASSISTANCE Description: STG Manage Bladder With Equipment With mod I Assistance Outcome: Progressing   Problem: RH SKIN INTEGRITY Goal: RH STG SKIN FREE OF INFECTION/BREAKDOWN Description: No skin breakdown this admission  Outcome: Progressing Goal: RH STG MAINTAIN SKIN INTEGRITY WITH ASSISTANCE Description: STG Maintain Skin Integrity independently Outcome: Progressing   Problem: RH SAFETY Goal: RH STG DECREASED RISK OF FALL WITH ASSISTANCE Description: STG Decreased Risk of Fall With supervision Assistance. Outcome: Progressing   Problem: RH PAIN MANAGEMENT Goal: RH STG PAIN MANAGED AT OR BELOW PT'S PAIN GOAL Description: Less than 3 out of 10 Outcome: Progressing   Problem: RH KNOWLEDGE DEFICIT GENERAL Goal: RH STG INCREASE KNOWLEDGE OF SELF CARE AFTER HOSPITALIZATION Outcome: Progressing   Problem: RH BOWEL ELIMINATION Goal: RH STG MANAGE BOWEL WITH ASSISTANCE Description: STG Manage Bowel with min Assistance. Outcome: Not Progressing Goal: RH STG MANAGE BOWEL W/MEDICATION W/ASSISTANCE Description: STG Manage Bowel with Medication with min Assistance. Outcome: Not Progressing   Problem: RH SAFETY Goal: RH STG ADHERE TO SAFETY PRECAUTIONS W/ASSISTANCE/DEVICE Description: STG Adhere to Safety Precautions With supervision Assistance/Device. Outcome: Not Progressing

## 2020-12-30 NOTE — Consult Note (Signed)
Medication request for depression per her hospital MD, Dr Posey Pronto.  Started Cymbalta 20 mg daily related to decreased risk of hyponatremia with her gabapentin medication.  Continue the Seroquel 400 mg BID as her mood has been stable without psychiatric admissions in years.  No suicidal/homicidal ideations, hallucinations, mania symptoms, or substance abuse issues.  Waylan Boga, PMHNP

## 2020-12-30 NOTE — Progress Notes (Signed)
Hypoglycemic Event  CBG: 65   Treatment: 4 oz juice/soda Ensure max given  Symptoms: None  Follow-up CBG: Time:1748 CBG Result:105  Possible Reasons for Event: Unknown  Comments/MD notified:MD Patel notified. Will hold dinner insulin at this time    Ashley [name], Ashley [name]

## 2020-12-30 NOTE — Progress Notes (Signed)
[place] PHYSICAL MEDICINE & REHABILITATION PROGRESS NOTE  Subjective/Complaints: Patient seen laying in bed this morning.  She states she slept well overnight, but is adamant that she is going to leave today.  Attempted to discuss and educate patient on safety and appropriateness, however patient perseverative on her animals.    ROS: Denies CP, SOB, N/V/D  Objective: Vital Signs: Blood pressure 108/69, pulse 87, temperature 98.5 F (36.9 C), resp. rate 18, height 5\' 7"  (1.702 m), weight 65.1 kg, SpO2 97 %. No results found. Recent Labs    12/29/20 0520  WBC 10.2  HGB 10.4*  HCT 32.5*  PLT 326   Recent Labs    12/29/20 0520  NA 136  K 4.1  CL 101  CO2 26  GLUCOSE 108*  BUN 23  CREATININE 0.84  CALCIUM 9.6    Intake/Output Summary (Last 24 hours) at 12/30/2020 0841 Last data filed at 12/29/2020 2000 Gross per 24 hour  Intake 330 ml  Output 1 ml  Net 329 ml        Physical Exam: BP 108/69 (BP Location: Right Arm)   Pulse 87   Temp 98.5 F (36.9 C)   Resp 18   Ht 5\' 7"  (1.702 m)   Wt 65.1 kg   SpO2 97%   BMI 22.48 kg/m  Constitutional: No distress . Vital signs reviewed. HENT: Normocephalic.  Atraumatic. Eyes: EOMI. No discharge. Cardiovascular: No JVD.  RRR. Respiratory: Normal effort.  No stridor.  Bilateral clear to auscultation. GI: Non-distended.  BS +. Skin: Warm and dry.  Intact. Psych: Slowed. Delayed.  Musc: No edema in extremities.  No tenderness in extremities. Neuro: Alert and oriented Dysphonia, unchanged Motor: Right upper extremity: Shoulder abduction 2+/5, distally 3+-4 -/5, stable Left upper extremity: Shoulder abduction 3/5, distally 3+/5, unchanged Bilateral lower extremities: Hip flexion, knee extension 2+/5, ankle dorsiflexion 3/5   Assessment/Plan: 1. Functional deficits which require 3+ hours per day of interdisciplinary therapy in a comprehensive inpatient rehab setting.  Physiatrist is providing close team supervision and  24 hour management of active medical problems listed below.  Physiatrist and rehab team continue to assess barriers to discharge/monitor patient progress toward functional and medical goals   Care Tool:  Bathing    Body parts bathed by patient: Right arm,Left arm,Chest,Abdomen,Front perineal area,Buttocks,Right upper leg,Left upper leg,Face   Body parts bathed by helper: Right lower leg,Left lower leg     Bathing assist Assist Level: Minimal Assistance - Patient > 75%     Upper Body Dressing/Undressing Upper body dressing   What is the patient wearing?: Dress,Hospital gown only    Upper body assist Assist Level: Supervision/Verbal cueing    Lower Body Dressing/Undressing Lower body dressing      What is the patient wearing?: Incontinence brief     Lower body assist Assist for lower body dressing: Supervision/Verbal cueing     Toileting Toileting    Toileting assist Assist for toileting: Contact Guard/Touching assist     Transfers Chair/bed transfer  Transfers assist     Chair/bed transfer assist level: Supervision/Verbal cueing     Locomotion Ambulation   Ambulation assist      Assist level: Contact Guard/Touching assist Assistive device: No Device Max distance: 150   Walk 10 feet activity   Assist     Assist level: Contact Guard/Touching assist Assistive device: No Device   Walk 50 feet activity   Assist    Assist level: Contact Guard/Touching assist Assistive device: No Device  Walk 150 feet activity   Assist    Assist level: Contact Guard/Touching assist Assistive device: No Device    Walk 10 feet on uneven surface  activity   Assist     Assist level: Minimal Assistance - Patient > 75% Assistive device: Other (comment) (none)   Wheelchair     Assist Will patient use wheelchair at discharge?: No   Wheelchair activity did not occur: N/A         Wheelchair 50 feet with 2 turns activity    Assist             Wheelchair 150 feet activity     Assist           Medical Problem List and Plan: 1.  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  Continue CIR  Patient perseverative on her animals and leaving.  She states that she was told by family that they would be sending animals to a shelter and she states her animals are her first priority, above her children, above her self.  Again, attempted to reason with patient, however patient states that she has made up her mind and she is going to leave by walking out of the hospital regardless of what anyone says.  Therapy notes reviewed from yesterday, which showed some inconsistencies in cognition as well as function.  Attempted to contact only number on file, which is her sisters, no answer, no voicemail.  Discussed with nursing as well, who reiterated patient's perseverance on her animals.  She refuses to even vital signs.  Per nursing, receptiveness appears to wax/wane.  She appears to have distrust of her family today, even though per therapies yesterday, patient was on the phone with daughters and seemed receptive to potentially moving in with daughter. Attempted to contact psychiatry given history of bipolar/schizophrenia, however no on-call number available.  Called behavioral health at [facility], on-call physician states he knows nothing about [facility] coverage.  Psychiatry consult placed. 2.  Antithrombotics: -DVT/anticoagulation:  Pharmaceutical: Lovenox             -antiplatelet therapy: ASA resumed 3/25 and Plavix to be resumed on 03/29 3. Pain Management: Oxycodone prn   Gabapentin for neuropathy decrease to 300 3 times daily per patient preference due to "grogginess".   Appears to be controlled on 3/27  Monitor with increased exertion  4. Mood: LCSW to follow for evaluation and support.              -antipsychotic agents:  N/A 5. Neuropsych: This patient is not fully capable of making decisions on her own behalf. 6. Skin/Wound Care: Monitor incision for healing.              --routine pressure relief measures.  7. Fluids/Electrolytes/Nutrition: Monitor I/Os.   BMP within acceptable range on 3/26 8.T2DM with hyperglycemia: Hgb A1c-9.5. Monitor BS ac/hs  Levemir 40 daily  Levemir 10 nightly added on 3/26  Continue glucotrol               Continue SSI for elevated BS--ensure between meals likely causing variation             Change to ensure max with meals and add CM restrictions to diet.               Monitor with increased mobility 9. Elevated BP: Monitor BP tid.  ?  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             --Continue statin 11. Schizophrenia/H/o Bipolar d/o: Managed with Seroquel  See #1 12.  Slow transit constipation: Change senna S to with supper            Bowel meds increased on 3/26  Patient intermittently refusing bowel meds 13. Leucocytosis: Resolved 14. ABLA:              Hemoglobin 10.4 on 3/26, continue to monitor 15. Urinary incontinence: Foley placed 03/22 for surgery with 1000 cc return, DC'd on 3/26.   I/O cath as necessary  Only one PVR performed  UA/Ucx ordered  > 35 spent in total in counseling patient and coordinating care regarding patient's desire to leave AMA.  Please see above  LOS: 2 days A FACE TO FACE EVALUATION WAS PERFORMED  Ankit Lorie Phenix 12/30/2020, 8:41 AM

## 2020-12-31 ENCOUNTER — Other Ambulatory Visit: Payer: Self-pay

## 2020-12-31 DIAGNOSIS — N3946 Mixed incontinence: Secondary | ICD-10-CM

## 2020-12-31 DIAGNOSIS — F203 Undifferentiated schizophrenia: Secondary | ICD-10-CM

## 2020-12-31 LAB — URINE CULTURE

## 2020-12-31 LAB — CBC
HCT: 34.4 % — ABNORMAL LOW (ref 36.0–46.0)
Hemoglobin: 11.2 g/dL — ABNORMAL LOW (ref 12.0–15.0)
MCH: 30.4 pg (ref 26.0–34.0)
MCHC: 32.6 g/dL (ref 30.0–36.0)
MCV: 93.2 fL (ref 80.0–100.0)
Platelets: 373 10*3/uL (ref 150–400)
RBC: 3.69 MIL/uL — ABNORMAL LOW (ref 3.87–5.11)
RDW: 14 % (ref 11.5–15.5)
WBC: 9.5 10*3/uL (ref 4.0–10.5)
nRBC: 0 % (ref 0.0–0.2)

## 2020-12-31 LAB — GLUCOSE, CAPILLARY
Glucose-Capillary: 132 mg/dL — ABNORMAL HIGH (ref 70–99)
Glucose-Capillary: 109 mg/dL — ABNORMAL HIGH (ref 70–99)
Glucose-Capillary: 191 mg/dL — ABNORMAL HIGH (ref 70–99)
Glucose-Capillary: 70 mg/dL (ref 70–99)
Glucose-Capillary: 159 mg/dL — ABNORMAL HIGH (ref 70–99)

## 2020-12-31 LAB — BASIC METABOLIC PANEL
Anion gap: 7 (ref 5–15)
BUN: 27 mg/dL — ABNORMAL HIGH (ref 8–23)
CO2: 27 mmol/L (ref 22–32)
Calcium: 9.9 mg/dL (ref 8.9–10.3)
Chloride: 104 mmol/L (ref 98–111)
Creatinine, Ser: 0.91 mg/dL (ref 0.44–1.00)
GFR, Estimated: >60 mL/min (ref >60)
Glucose, Bld: 119 mg/dL — ABNORMAL HIGH (ref 70–99)
Potassium: 4 mmol/L (ref 3.5–5.1)
Sodium: 138 mmol/L (ref 135–145)

## 2020-12-31 NOTE — Progress Notes (Signed)
Physical Therapy Session Note  Patient Details  Name: Ashley [name] MRN: [id number] Date of Birth: 04-Apr-1961  Today's Date: 12/31/2020 PT Individual Time: [phone] and 1300-1353 PT Individual Time Calculation (min): 55 min and 53 min  Short Term Goals: Week 1:  PT Short Term Goal 1 (Week 1): = LTGs due to ELOS  Skilled Therapeutic Interventions/Progress Updates:   Treatment Session 1: [phone] 55 min Received pt sitting upright in bed, pt agreeable to therapy, and denied any pain during session but reported numbness/tingling in RUE and mild nausea. Session with focus on dressing, functional mobility/transfers, generalized strengthening, dynamic standing balance/coordination, ambulation, toileting, stair navigation, and improved activity tolerance. Donned non-skid socks with set up assist and pt transferred semi-reclined<>sitting EOB with supervision. Doffed dirty gown and donned clean one with set up assist. Pt reported urge to urinate and ambulated 62ft without AD and CGA into bathroom. Pt able to manage clothing with supervision and void. Doffed dirty brief and donned clean brief standing with mod A. Pt stood at sink and washed hands with supervision and sat in [facility] and brushed teeth with supervision. RN present to administer medications. MD present for morning rounds and pt had to take seated rest break when speaking with MD due to reports of increased nausea. Discussed D/C date of Thursday as pt not willing to stay any longer. RN made aware and present to administer anti-nausea medication. Pt transported to 4W therapy gym in [facility] total A for time management purposes and then reported urge to urinate again. Pt transported to bathroom and able to toilet (clothing and hygiene management) without AD and supervision. Pt then navigated 12 6in steps and 24 3in steps with R handrail and CGA alternating ascending and descending with a step to and step through pattern with no LOB noted but mild  hyperextension in R knee. Pt reports having RW at home and discussed using it temporarily upon D/C for safety; pt in agreement. Pt transported back to room in [facility] total A and requested to return to bed. Stand<>pivot WC<>bed without AD and close supervision and sit<>supine with supervision. Concluded session with pt semi-reclined in bed, needs within reach, and bed alarm on.   Treatment Session 2: [id number] 53 min Received pt semi-reclined in bed, pt agreeable to therapy, and denied any pain during session. Session with focus on functional mobility/transfers, dressing, generalized strengthening, dynamic standing balance/coordination, ambulation, simulated car transfers, and improved activity tolerance. Donned non-skid socks and scrub pants with supervision and transferred semi-reclined<>sitting EOB with supervision. Doffed dirty brief in standing with max A and donned clean one with mod A and able to pull pants over hips with supervision. Stand<>pivot bed<>WC without AD and supervision. Pt educated on back and neck precautions due to difficulty recalling them. Pt transported to 64M ortho gym in [facility] total A for time management purposes and performed ambulatory simulated car transfer without AD and close supervision. Pt ambulated 106ft on uneven surfaces (ramp) without AD and supervision with RUE support and over mulch with CGA/min A, and navigated 1 6in curb with RUE support and CGA. Pt transported to 4W dayroom in [facility] total A and ambulated 1102ft with RW and supervision. Pt demonstrated mild R knee hyperextension with gait and reported "popping" in R knee and hesitant of R knee buckling; no buckling noted. Pt ambulated additional 64ft with RW and supervision to Nustep and performed BLE/BUE(LUE) strengthening on Nustep at workload 5 for 5 minutes for a total of 210 steps for improved cardiovascular endurance with  2 rest breaks; stopped due to UE fatigue. Educated pt on technique for picking up object from floor via  squatting rather than bending over however pt unable to squat low enough to pick up cup; therefore returned to sitting. Pt transported back to room in [facility] total A and transferred WC<>bed stand<>pivot without AD and supervision. Doffed pants and gown and donned hospital gown with supervision. Sit<>semi-reclined with supervision. Concluded session with pt semi-reclined in bed, needs within reach, and bed alarm on.   Therapy Documentation Precautions:  Precautions Precautions: Fall,Cervical Restrictions Weight Bearing Restrictions: No  Therapy/Group: Individual Therapy Alfonse Alpers PT, DPT   12/31/2020, 6:47 AM

## 2020-12-31 NOTE — IPOC Note (Signed)
Overall Plan of Care [facility]) Patient Details Name: Ashley [name] MRN: [id number] DOB: 10-14-1960  Admitting Diagnosis: Cervical myelopathy [facility])  Hospital Problems: Principal Problem:   Cervical myelopathy ([place]) Active Problems:   Postoperative pain   Urinary incontinence   Slow transit constipation   Controlled type 2 diabetes mellitus with hyperglycemia ([place])   Schizophrenia ([place])   Bipolar affective disorder, current episode depressed ([place])     Functional Problem List: Nursing Bladder,Bowel,Endurance,Medication Management,Pain,Perception,Safety,Skin Integrity  PT Balance,Endurance,Sensory,Safety,Motor  OT Balance,Cognition,Motor,Endurance,Safety,Perception,Skin Integrity,Sensory  SLP    TR         Basic ADL's: OT Eating,Grooming,Bathing,Toileting,Dressing     Advanced  ADL's: OT Simple Meal Preparation,Light Housekeeping     Transfers: PT Bed Mobility,Bed to [employer]  OT Tub/Shower,Toilet     Locomotion: PT Ambulation,Stairs     Additional Impairments: OT Fuctional Use of Upper Extremity  SLP        TR      Anticipated Outcomes Item Anticipated Outcome  Self Feeding ind  Swallowing      Basic self-care  mod I  Toileting  mod I   Bathroom Transfers mod I  Bowel/Bladder  Patient will be continent of bowel and bladder with min assist.  Transfers  modified independent basic; supervision car  Locomotion  modified independent household gait x 50', S controlled and community x 150', S 12 steps 1 rail  Communication     Cognition     Pain  Pain will be less than or equal to 4/10 with min assist  Safety/Judgment  patient will be free from falls/injury and displaying appropriate safety judgement   Therapy Plan: PT Intensity: Minimum of 1-2 x/day ,45 to 90 minutes PT Frequency: 5 out of 7 days PT Duration Estimated Length of Stay: 7 days OT Intensity: Minimum of 1-2 x/day, 45 to 90 minutes OT Frequency: 5 out of 7 days OT  Duration/Estimated Length of Stay: 12 to 14 days     Due to the current state of emergency, patients may not be receiving their 3-hours of Medicare-mandated therapy.   Team Interventions: Nursing Interventions Patient/Family Education,Pain Management,Bladder Management,Bowel Management,Medication Management,Skin Care/Wound Management,Psychosocial Support  PT interventions Ambulation/gait training,Cognitive remediation/compensation,Discharge planning,DME/adaptive equipment instruction,Functional mobility training,Pain management,Psychosocial support,Splinting/orthotics,Therapeutic Activities,UE/LE Strength taining/ROM,Balance/vestibular training,Community reintegration,Neuromuscular re-education,Patient/family education,Stair training,Therapeutic Exercise,UE/LE Museum/gallery conservator propulsion/positioning  OT Interventions Balance/vestibular training,Disease Special educational needs teacher re-education,Self Care/advanced ADL retraining,Therapeutic Exercise,Wheelchair propulsion/positioning,UE/LE Strength taining/ROM,Skin care/wound Manufacturing systems engineer stimulation,Patient/family education,Splinting/orthotics,UE/LE Coordination activities,Visual/perceptual remediation/compensation,Therapeutic Activities,Psychosocial support,Functional mobility training,Discharge planning  SLP Interventions    TR Interventions    SW/CM Interventions Discharge Planning,Psychosocial Support,Patient/Family Education,Disease Management/Prevention   Barriers to Discharge MD  Medical stability, Home enviroment access/loayout, Incontinence, Neurogenic bowel and bladder, Wound care, Lack of/limited family support, Weight bearing restrictions and Behavior  Nursing      PT      OT Decreased caregiver support,Incontinence    SLP      SW Home environment access/layout 12 steps to enter  apartment   Team Discharge Planning: Destination: PT-Home ,OT- Home , SLP-  Projected Follow-up: PT-Outpatient PT, OT-  Home health OT,Outpatient OT, SLP-  Projected Equipment Needs: PT- , OT- To be determined, SLP-  Equipment Details: PT-owns a RW, OT-  Patient/family involved in discharge planning: PT- Patient,  OT-Patient, SLP-   MD ELOS: 5-7 days Medical Rehab Prognosis:  Good Assessment: Pt is a 60 yr old female with hx of schizophrenia/bipolar d/o s/p cervical fusion/ACDF due to cervical myelopathy- also has Neurogenic bowel and bladder- pt agreed to  wait til D/C til Thursday, which is ideal, based on function.  Goals mod I    See Team Conference Notes for weekly updates to the plan of care

## 2020-12-31 NOTE — Progress Notes (Signed)
Inpatient Rehabilitation  Patient information reviewed and entered into eRehab system by Melissa M. Bowie, M.A., CCC/SLP, PPS Coordinator.  Information including medical coding, functional ability and quality indicators will be reviewed and updated through discharge.    

## 2020-12-31 NOTE — Progress Notes (Signed)
Requested all scheduled 2000 & 2200 meds given by 2000. Agreeable to stay until Thursday. Anterior neck incision approximated and OTA. PRN Oxy IR given at 2002. Refused scheduled gabapentin-"it doesn't help." Also refused, scheduled miralx and senokot S.  Ambulated to BR, continent B & B. Bladder scan=37cc's. Patrici Ranks A

## 2020-12-31 NOTE — Progress Notes (Signed)
[place] PHYSICAL MEDICINE & REHABILITATION PROGRESS NOTE  Subjective/Complaints:   Pt reports nauseated after breakfast- they started Duloxetine today- likely the reason she's nauseated- ate breakfast well.  Tingling in hands- LBM yesterday- voiding "well" per pt.   Going to daughter's when leaves- needs to do flight of stairs.    ROS:  Pt denies SOB, abd pain, CP, N/V/C/D, and vision changes   Objective: Vital Signs: Blood pressure 132/79, pulse 86, temperature 98.4 F (36.9 C), resp. rate 18, height 5\' 7"  (1.702 m), weight 65.1 kg, SpO2 98 %. No results found. Recent Labs    12/29/20 0520 12/31/20 0657  WBC 10.2 9.5  HGB 10.4* 11.2*  HCT 32.5* 34.4*  PLT 326 373   Recent Labs    12/29/20 0520 12/31/20 0657  NA 136 138  K 4.1 4.0  CL 101 104  CO2 26 27  GLUCOSE 108* 119*  BUN 23 27*  CREATININE 0.84 0.91  CALCIUM 9.6 9.9    Intake/Output Summary (Last 24 hours) at 12/31/2020 2014 Last data filed at 12/31/2020 1750 Gross per 24 hour  Intake 1560 ml  Output --  Net 1560 ml        Physical Exam: BP 132/79 (BP Location: Right Arm)   Pulse 86   Temp 98.4 F (36.9 C)   Resp 18   Ht 5\' 7"  (1.702 m)   Wt 65.1 kg   SpO2 98%   BMI 22.48 kg/m     General: awake, alert, appropriate, sitting up in w/c at sink, doing grooming,  NAD HENT: conjugate gaze; oropharynx moist- cervical incision looks great- some bruising CV: regular rate; no JVD Pulmonary: CTA B/L; no W/R/R- good air movement GI: soft, NT, ND, (+)BS- hypoactive Psychiatric: appropriate- interactive- slightly slowed/delayed processing. Neurological: alert Musc: No edema in extremities.  No tenderness in extremities. Neuro: Alert and oriented Dysphonia, unchanged Motor: Right upper extremity: Shoulder abduction 2+/5, distally 3+-4 -/5, stable Left upper extremity: Shoulder abduction 3/5, distally 3+/5, unchanged Bilateral lower extremities: Hip flexion, knee extension 2+/5, ankle  dorsiflexion 3/5   Assessment/Plan: 1. Functional deficits which require 3+ hours per day of interdisciplinary therapy in a comprehensive inpatient rehab setting.  Physiatrist is providing close team supervision and 24 hour management of active medical problems listed below.  Physiatrist and rehab team continue to assess barriers to discharge/monitor patient progress toward functional and medical goals   Care Tool:  Bathing    Body parts bathed by patient: Right arm,Left arm,Chest,Abdomen,Front perineal area,Buttocks,Right upper leg,Left upper leg,Face   Body parts bathed by helper: Right lower leg,Left lower leg     Bathing assist Assist Level: Minimal Assistance - Patient > 75%     Upper Body Dressing/Undressing Upper body dressing   What is the patient wearing?: Dress,Hospital gown only    Upper body assist Assist Level: Supervision/Verbal cueing    Lower Body Dressing/Undressing Lower body dressing      What is the patient wearing?: Incontinence brief     Lower body assist Assist for lower body dressing: Supervision/Verbal cueing     Toileting Toileting    Toileting assist Assist for toileting: Minimal Assistance - Patient > 75% (with brief)     Transfers Chair/bed transfer  Transfers assist     Chair/bed transfer assist level: Supervision/Verbal cueing     Locomotion Ambulation   Ambulation assist      Assist level: Supervision/Verbal cueing Assistive device: Walker-rolling Max distance: 197ft   Walk 10 feet activity   Assist  Assist level: Supervision/Verbal cueing Assistive device: Walker-rolling   Walk 50 feet activity   Assist    Assist level: Supervision/Verbal cueing Assistive device: Walker-rolling    Walk 150 feet activity   Assist    Assist level: Supervision/Verbal cueing Assistive device: Walker-rolling    Walk 10 feet on uneven surface  activity   Assist     Assist level: Supervision/Verbal  cueing Assistive device: Other (comment) (R handrail)   Wheelchair     Assist Will patient use wheelchair at discharge?: No   Wheelchair activity did not occur: N/A         Wheelchair 50 feet with 2 turns activity    Assist            Wheelchair 150 feet activity     Assist           Medical Problem List and Plan: 1.  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  Continue CIR  3/28- con't PT and OT- pt in a better place today 2.  Antithrombotics: -DVT/anticoagulation:  Pharmaceutical: Lovenox             -antiplatelet therapy: ASA resumed 3/25 and Plavix to be resumed on 03/29 3. Pain Management: Oxycodone prn   Gabapentin for neuropathy decrease to 300 3 times daily per patient preference due to "grogginess".   Appears to be controlled on 3/27  3/28- con't regimen- pt reports tingling isn't bothersome  Monitor with increased exertion  4. Mood: LCSW to follow for evaluation and support.              -antipsychotic agents: N/A 5. Neuropsych: This patient is not fully capable of making decisions on her own behalf. 6. Skin/Wound Care: Monitor incision for healing.              --routine pressure relief measures.  7. Fluids/Electrolytes/Nutrition: Monitor I/Os.   BMP within acceptable range on 3/26 8.T2DM with hyperglycemia: Hgb A1c-9.5. Monitor BS ac/hs  Levemir 40 daily  Levemir 10 nightly added on 3/26  Continue glucotrol               Continue SSI for elevated BS--ensure between meals likely causing variation             Change to ensure max with meals and add CM restrictions to diet.  3/28- will see if was on insulin prior- BGs 70-191- con't regimen for now.                Monitor with increased mobility 9. Elevated BP: Monitor BP tid.  ?  Resolved 10. L-MCA CVA: DAPT being resumed per NS recommendations.             --Continue  statin 11. Schizophrenia/H/o Bipolar d/o: Managed with Seroquel  See #1  3/28- will change Duloxetine to 20 mg QHS to help with nausea.  12.  Slow transit constipation: Change senna S to with supper            Bowel meds increased on 3/26  Patient intermittently refusing bowel meds  3/28- LBM yesterday- con't regimen 13. Leucocytosis: Resolved 14. ABLA:              Hemoglobin 10.4 on 3/26, continue to monitor 15. Urinary incontinence: Foley placed 03/22 for surgery with 1000 cc return, DC'd on 3/26.   I/O cath as necessary  Only one PVR performed  UA/Ucx ordered  3/28- U/A (-)  for UTI- will d/w pt in AM    LOS: 3 days A FACE TO FACE EVALUATION WAS PERFORMED  Ashley [name] 12/31/2020, 8:14 PM

## 2020-12-31 NOTE — Progress Notes (Signed)
Inpatient Rehabilitation Care Coordinator Assessment and Plan Patient Details  Name: Ashley [name] MRN: [id number] Date of Birth: Jul 21, 1961  Today's Date: 12/31/2020  Hospital Problems: Principal Problem:   Cervical myelopathy (HCC) Active Problems:   Postoperative pain   Urinary incontinence   Slow transit constipation   Controlled type 2 diabetes mellitus with hyperglycemia (HCC)   Schizophrenia (HCC)   Bipolar affective disorder, current episode depressed (HCC)  Past Medical History:  Past Medical History:  Diagnosis Date  . Anxiety   . Arthritis   . Bipolar 1 disorder (HCC)   . Depression   . Diabetes mellitus without complication (HCC)    Type II  . GERD (gastroesophageal reflux disease)   . Heart murmur    "nothing to worry about"  . HTN (hypertension)    not on medication  . Stroke [facility]) 11/2020   Past Surgical History:  Past Surgical History:  Procedure Laterality Date  . ABDOMINAL HYSTERECTOMY    . ANTERIOR CERVICAL DECOMP/DISCECTOMY FUSION N/A 12/25/2020   Procedure: CERVICAL THREE-FOUR ANTERIOR CERVICAL DECOMPRESSION/DISCECTOMY FUSION;  Surgeon: Jadene Pierini, MD;  Location: [facility] OR;  Service: Neurosurgery;  Laterality: N/A;  CERVICAL THREE-FOUR ANTERIOR CERVICAL DECOMPRESSION/DISCECTOMY FUSION   . LUMBAR LAMINECTOMY/DECOMPRESSION MICRODISCECTOMY Left 04/07/2016   Procedure: Left Lumbar four-five Microdiskectomy;  Surgeon: Maeola Harman, MD;  Location: [facility]UR[facility] ORS;  Service: Neurosurgery;  Laterality: Left;   Social History:  reports that she has been smoking cigarettes. She has a 9.75 pack-year smoking history. She has never used smokeless tobacco. She reports current drug use. Drug: Marijuana. She reports that she does not drink alcohol.  Family / Support Systems Marital Status: Divorced Children: Amelia, [name] Other Supports: Proofreader (sister) Anticipated Caregiver: Amelia(24/7) and [name] (Daughters) Ability/Limitations of Caregiver:  None Caregiver Availability: 24/7  Social History Preferred language: English Religion: None Read: Yes Write: Yes Employment Status: Retired Marine scientist Issues: n/a Guardian/Conservator: Bryn Gulling   Abuse/Neglect Abuse/Neglect Assessment Can Be Completed: Yes Physical Abuse: Denies Verbal Abuse: Denies Sexual Abuse: Denies Exploitation of patient/patient's resources: Denies Self-Neglect: Denies  Emotional Status Recent Psychosocial Issues: Coping, Tobacco, Anxiety, Depression, Bi Polar Depression Psychiatric History: n/a Substance Abuse History: n/a  Patient / Family Perceptions, Expectations & Goals Pt/Family understanding of illness & functional limitations: yes Premorbid pt/family roles/activities: Previously Indpendent and driving Anticipated changes in roles/activities/participation: Some assistance/supervision Pt/family expectations/goals: MOD I  Building surveyor: None Premorbid Home Care/DME Agencies: Other (Comment) ([employer], [employer], Arts development officer) Transportation available at discharge: family able to transport Resource referrals recommended: Neuropsychology (Tobacco, Coping, Bi Polar, Anxiety, Depression)  Discharge Planning Living Arrangements: Alone Support Systems: Children Type of Residence: Private residence Insurance Resources: Media planner (specify) ([facility]) Financial Resources: Restaurant manager, fast food Screen Referred: No Living Expenses: Rent Money Management: Patient Does the patient have any problems obtaining your medications?: No Home Management: Independent Patient/Family Preliminary Plans: Assistance and Supervison from daughters and sister Care Coordinator Barriers to Discharge: Home environment access/layout Care Coordinator Barriers to Discharge Comments: 12 steps to enter apartment Care Coordinator Anticipated Follow Up Needs: HH/OP Expected length of stay: 14-16  Days  Clinical Impression Sw met with pt introduced self, explained role. Called pt sister and provided the same information. SW informed pt and sister of pt tentively d/c on Thursday. Pt agrees, sister has concerns with pt d/c on Thursday. Sister reports pt daughter who is suppose to provide 24/7 works. Sister informed on the need for patient to participate due to CIR guidelines. Sister reports she  will speak with pt. Sw informed sister and pt that as of now patient will d/c on Thursday 3/31. SW addressed questions and concerns. SW will continue to follow up with pt and family.   Dyanne Iha 12/31/2020, 12:35 PM

## 2020-12-31 NOTE — Progress Notes (Signed)
Occupational Therapy Session Note  Patient Details  Name: HOLLEIGH CRIHFIELD MRN: [id number] Date of Birth: Nov 07, 1960  Today's Date: 12/31/2020 OT Individual Time: 0750-0810 OT Individual Time Calculation (min): 20 min  and Today's Date: 12/31/2020 OT Missed Time: 25 Minutes Missed Time Reason: Patient unwilling/refused to participate without medical reason;Other (comment) (Pt not wanting to participate in OT session until she had breakfast)   Short Term Goals: Week 1:  OT Short Term Goal 1 (Week 1): Pt will don pants/brief with min A with AE PRN. OT Short Term Goal 2 (Week 1): Pt will complete shower transfer with CGA with AE PRN. OT Short Term Goal 3 (Week 1): Pt will will complete standing ADL for >5 min with CGA. OT Short Term Goal 4 (Week 1): Pt will ind recall at least 3/5 cervical precautions in prep for safe ADL performance.   Skilled Therapeutic Interventions/Progress Updates:    Pt greeted at time of session semireclined in bed finishing up med pass with RN. Pt stating she had not had breakfast, wanting to eat prior to therapy. Tray was brought in at this time, attempted to have pt sit EOB or OOB for optimal eating positioning but declined, wanting to be left to eat breakfast without assist. OT entered approx 20 minutes later for continuation of session. Pt more agreeable at this time to session since she had finished eating, demonstrated self feeding techniques and pt demonstrating she uses L hand for feeding - also did this prior to CVA and surgery. Used RUE for [facility] training picking up and placing small items (s&P, butter, towels, etc) in small bowl to save for later. Reviewed hand writing activities and provided lines on paper for practicing her name for "homework." Opened OJ containers with RUE for [facility] as well with Supervision. Pt left semireclined in bed alarm on call bell in reach. Missed 25 mins OT.  Therapy Documentation Precautions:  Precautions Precautions:  Fall,Cervical Restrictions Weight Bearing Restrictions: No     Therapy/Group: Individual Therapy  Viona Gilmore 12/31/2020, 7:14 AM

## 2020-12-31 NOTE — Progress Notes (Signed)
Inpatient Rehabilitation Center Individual Statement of Services  Patient Name:  Ashley [name]  Date:  12/31/2020  Welcome to the [facility].  Our goal is to provide you with an individualized program based on your diagnosis and situation, designed to meet your specific needs.  With this comprehensive rehabilitation program, you will be expected to participate in at least 3 hours of rehabilitation therapies Monday-Friday, with modified therapy programming on the weekends.  Your rehabilitation program will include the following services:  Physical Therapy (PT), Occupational Therapy (OT), Speech Therapy (ST), 24 hour per day rehabilitation nursing, Therapeutic Recreaction (TR), Neuropsychology, Care Coordinator, Rehabilitation Medicine, Nutrition Services, Pharmacy Services and Other  Weekly team conferences will be held on Tuesdays to discuss your progress.  Your Inpatient Rehabilitation Care Coordinator will talk with you frequently to get your input and to update you on team discussions.  Team conferences with you and your family in attendance may also be held.  Expected length of stay: 14-16 Days  Overall anticipated outcome: MOD I  Depending on your progress and recovery, your program may change. Your Inpatient Rehabilitation Care Coordinator will coordinate services and will keep you informed of any changes. Your Inpatient Rehabilitation Care Coordinator's name and contact numbers are listed  below.  The following services may also be recommended but are not provided by the [facility]:    [place] will be made to provide these services after discharge if needed.  Arrangements include referral to agencies that provide these services.  Your insurance has been verified to be:  [place] primary doctor is:  Asencion Noble, MD  Pertinent information will be  shared with your doctor and your insurance company.  Inpatient Rehabilitation Care Coordinator:  Erlene Quan, [place] or [phone]  Information discussed with and copy given to patient by: Dyanne Iha, 12/31/2020, 7:53 AM

## 2021-01-01 LAB — GLUCOSE, CAPILLARY
Glucose-Capillary: 112 mg/dL — ABNORMAL HIGH (ref 70–99)
Glucose-Capillary: 264 mg/dL — ABNORMAL HIGH (ref 70–99)
Glucose-Capillary: 128 mg/dL — ABNORMAL HIGH (ref 70–99)
Glucose-Capillary: 119 mg/dL — ABNORMAL HIGH (ref 70–99)

## 2021-01-01 MED ORDER — LORATADINE 10 MG PO TABS
10.0000 mg | ORAL_TABLET | Freq: Every day | ORAL | Status: DC
  Administered 2021-01-01 – 2021-01-03 (×3): 10 mg via ORAL
  Filled 2021-01-01 (×3): qty 1

## 2021-01-01 MED ORDER — POLYETHYLENE GLYCOL 3350 17 G PO PACK: 17.0000 g | PACK | Freq: Every day | ORAL | Status: DC | PRN

## 2021-01-01 MED ORDER — ASCORBIC ACID 500 MG PO TABS
500.0000 mg | ORAL_TABLET | Freq: Two times a day (BID) | ORAL | Status: DC
  Administered 2021-01-01 – 2021-01-03 (×4): 500 mg via ORAL
  Filled 2021-01-01 (×4): qty 1

## 2021-01-01 NOTE — Progress Notes (Signed)
[place] PHYSICAL MEDICINE & REHABILITATION PROGRESS NOTE  Subjective/Complaints:  Pt reports mild nausea this AM- we have changed Duloxetine to night time- got first dose yesterday AM, but will get tonight.  Has not required cathing in a couple of days- voiding well-  Was on insulin at home- 40 units Levemir- explained we have started 2nd dose at night- pt voiced understand.  Bowels working OK- doesn't want to have accidents, so has refused bowel meds. Also refusing Gabapentin- makes her feel weird and doesn't help.     ROS:   Pt denies SOB, abd pain, CP, N/V/C/D, and vision changes  Objective: Vital Signs: Blood pressure 111/77, pulse 87, temperature 98.1 F (36.7 C), resp. rate 18, height 5\' 7"  (1.702 m), weight 65.1 kg, SpO2 99 %. No results found. Recent Labs    12/31/20 0657  WBC 9.5  HGB 11.2*  HCT 34.4*  PLT 373   Recent Labs    12/31/20 0657  NA 138  K 4.0  CL 104  CO2 27  GLUCOSE 119*  BUN 27*  CREATININE 0.91  CALCIUM 9.9    Intake/Output Summary (Last 24 hours) at 01/01/2021 8756 Last data filed at 12/31/2020 2300 Gross per 24 hour  Intake 1200 ml  Output --  Net 1200 ml        Physical Exam: BP 111/77 (BP Location: Right Arm)   Pulse 87   Temp 98.1 F (36.7 C)   Resp 18   Ht 5\' 7"  (1.702 m)   Wt 65.1 kg   SpO2 99%   BMI 22.48 kg/m      General: awake, alert, appropriate, sitting EOB, nurse in room, NAD HENT: conjugate gaze; oropharynx moist- poor dentition anterior cervical incision- bruised, but looks good CV: regular rate; no JVD Pulmonary: CTA B/L; no W/R/R- good air movement GI: soft, NT, ND, (+)BS- normoactive Psychiatric: appropriate- more interactive, quiet, very slightly delayed- but better Neurological: Ox3 Musc: No edema in extremities.  No tenderness in extremities. Neuro: Alert and oriented Dysphonia, unchanged Motor: Right upper extremity: Shoulder abduction 2+/5, distally 3+-4 -/5, stable Left upper extremity:  Shoulder abduction 3/5, distally 3+/5, unchanged Bilateral lower extremities: Hip flexion, knee extension 2+/5, ankle dorsiflexion 3/5   Assessment/Plan: 1. Functional deficits which require 3+ hours per day of interdisciplinary therapy in a comprehensive inpatient rehab setting.  Physiatrist is providing close team supervision and 24 hour management of active medical problems listed below.  Physiatrist and rehab team continue to assess barriers to discharge/monitor patient progress toward functional and medical goals   Care Tool:  Bathing    Body parts bathed by patient: Right arm,Left arm,Chest,Abdomen,Front perineal area,Buttocks,Right upper leg,Left upper leg,Face   Body parts bathed by helper: Right lower leg,Left lower leg     Bathing assist Assist Level: Minimal Assistance - Patient > 75%     Upper Body Dressing/Undressing Upper body dressing   What is the patient wearing?: Dress,Hospital gown only    Upper body assist Assist Level: Supervision/Verbal cueing    Lower Body Dressing/Undressing Lower body dressing      What is the patient wearing?: Incontinence brief     Lower body assist Assist for lower body dressing: Supervision/Verbal cueing     Toileting Toileting    Toileting assist Assist for toileting: Minimal Assistance - Patient > 75% (with brief)     Transfers Chair/bed transfer  Transfers assist     Chair/bed transfer assist level: Supervision/Verbal cueing     Locomotion Ambulation  Ambulation assist      Assist level: Supervision/Verbal cueing Assistive device: Walker-rolling Max distance: 135ft   Walk 10 feet activity   Assist     Assist level: Supervision/Verbal cueing Assistive device: Walker-rolling   Walk 50 feet activity   Assist    Assist level: Supervision/Verbal cueing Assistive device: Walker-rolling    Walk 150 feet activity   Assist    Assist level: Supervision/Verbal cueing Assistive device:  Walker-rolling    Walk 10 feet on uneven surface  activity   Assist     Assist level: Supervision/Verbal cueing Assistive device: Other (comment) (R handrail)   Wheelchair     Assist Will patient use wheelchair at discharge?: No   Wheelchair activity did not occur: N/A         Wheelchair 50 feet with 2 turns activity    Assist            Wheelchair 150 feet activity     Assist           Medical Problem List and Plan: 1.  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  Continue CIR  3/29- did stairs 18 and then 21 stairs yesterday with therapy- con't PT and OT 2.  Antithrombotics: -DVT/anticoagulation:  Pharmaceutical: Lovenox             -antiplatelet therapy: ASA resumed 3/25 and Plavix to be resumed on 03/29 3. Pain Management: Oxycodone prn   Gabapentin for neuropathy decrease to 300 3 times daily per patient preference due to "grogginess".   Appears to be controlled on 3/27  3/28- con't regimen- pt reports tingling isn't bothersome  3/29- pt reports pain OK- still having nerve pain, but we discussed how Duloxetine helps as well- wants me to stop Gabapentin- makes her feel weird. stopped  Monitor with increased exertion  4. Mood: LCSW to follow for evaluation and support.              -antipsychotic agents: N/A 5. Neuropsych: This patient is not fully capable of making decisions on her own behalf. 6. Skin/Wound Care: Monitor incision for healing.              --routine pressure relief measures.  7. Fluids/Electrolytes/Nutrition: Monitor I/Os.   BMP within acceptable range on 3/26 8.T2DM with hyperglycemia: Hgb A1c-9.5. Monitor BS ac/hs  Levemir 40 daily  Levemir 10 nightly added on 3/26  Continue glucotrol               Continue SSI for elevated BS--ensure between meals likely causing variation             Change to ensure max with  meals and add CM restrictions to diet.  3/28- will see if was on insulin prior- BGs 70-191- con't regimen for now.  3/29- pt was on Levemir 40 units daily at home- have added night Levemir- pt OK with this- BP low to mid 100s in last 24 hours- con't regimen               Monitor with increased mobility 9. Elevated BP: Monitor BP tid.  3/29- BP 111/77- well controlled- -con't to monitor 10. L-MCA CVA: DAPT being resumed per NS recommendations.             --Continue statin 11. Schizophrenia/H/o Bipolar d/o: Managed with Seroquel  See #1  3/28- will change Duloxetine to 20 mg QHS to help with nausea.  3/29-  still mildly nauseated, but not enough needs meds- con't at night for now  12.  Slow transit constipation: Change senna S to with supper            Bowel meds increased on 3/26  Patient intermittently refusing bowel meds  3/29- will stop Senokot since refusing for days and make miralax prn- pt still having BMs 13. Leucocytosis: Resolved 14. ABLA:              Hemoglobin 10.4 on 3/26, continue to monitor 15. Urinary incontinence: Foley placed 03/22 for surgery with 1000 cc return, DC'd on 3/26.   I/O cath as necessary  Only one PVR performed  UA/Ucx ordered  3/28- U/A (-) for UTI- will d/w pt in AM  3/29- pt voiding well- no caths- con't regimen/monitor 16/ Dispo  3/29- have asked to speak with SW about p[ossibly losing apartment- Also wants ot leave Thursday which appears OK.     LOS: 4 days A FACE TO FACE EVALUATION WAS PERFORMED  Ashley [name] 01/01/2021, 9:28 AM

## 2021-01-01 NOTE — Progress Notes (Signed)
Physical Therapy Session Note  Patient Details  Name: Ashley [name] MRN: [id number] Date of Birth: 19-Jan-1961  Today's Date: 01/01/2021 PT Individual Time: 1000-1053 and 1301-1355  PT Individual Time Calculation (min): 53 min and 54 min  Short Term Goals: Week 1:  PT Short Term Goal 1 (Week 1): = LTGs due to ELOS  Skilled Therapeutic Interventions/Progress Updates:   Treatment Session 1: 1000-1053 53 min Received pt semi-reclined in bed with CSW present discussing D/C plan, pt agreeable to therapy, and denied any pain during session. Pt's daughter present for family education training. Session with focus on discharge planning, functional mobility/transfers, generalized strengthening, dynamic standing balance/coordination, ambulation, stair navigation, simulated car transfers, and improved activity tolerance. Stand<>pivots without AD and supervision throughout session. Pt transported to 4W therapy gym in [facility] total A for time management purposes. In 4W stairwell, pt navigated 11 steps x 2 trials with L handrail. Trial 1: pt ascending and descending with a step through pattern but with poor eccentric control when descending steps. Encouraged pt to descend with step to pattern for safety. Pt demonstrated good understanding and performed Trial 2 ascending with a step through and descending with a step through pattern with supervision provided by pt's daughter. Provided pt with yellow putty to work on RUE grip strength and pt ambulated 156ft with RW and supervision provided by pt's daughter. Pt performed ambulatory simulated car transfer with RW and supervision and ambulated 82ft on uneven surfaces (ramp) with RW and supervision. Educated pt/daughter on recommendation to use RW upon D/C as pt with hx of R knee unsteadiness/buckling; both in agreement. Discussed options for additional pieces of equipment including walker skiis to slide on carpet, walker bag, and reacher (to maintain spinal precautions).  Reviewed neck and spinal precautions; pt able to recall 3/3 spinal precautions with increased time but required cues from therapist for neck precautions. Pt reported having high bed at home and having to step on boxspring to get into bed. Set up mat to height of pt's bed and pt performed bed mobility from high bed independently while maintaining spinal precautions using logroll technique. Pt reported spending most of her time lying in bed with her animals; discussed importance of OOB for strength, respiratory, cardiovascular, and digestive benefits; pt agreed to spend more time OOB. Pt transported back to room in [facility] total A and requested to sit up EOB but agreed to ask for assist to get into recliner prior to lunch. Concluded session with pt sitting EOB, needs within reach, and bed alarm on. Provided pt with ensure and fresh ice water.   Treatment Session 2: 9937-1696 54 min Received pt semi-reclined in bed, pt agreeable to therapy, and denied any pain during session but reported feeling "exhausted" from therapy earlier and requested to only do 30 minutes of therapy this afternoon. Session with focus on functional mobility/transfers, generalized strengthening, dynamic standing balance/coordination, toileting, dressing, and improved activity tolerance. Pt performed bed mobility independently and transferred into [facility] with supervision without AD. Pt transported to 4W dayroom in [facility] total A for time management purposes. Worked on dynamic standing balance playing cornhole using RUE with supervision/CGA for balance x 4 trials. Pt reported "lightheadness" and "dizziness" upon standing requiring multiple seated rest breaks throughout and educated pt on pursed lip breathing throughout. Pt requested to return to room and transported back to [facility] in [facility] total A. Pt reported having to urinate but due to urgency went in brief. Pt requested to get cleaned up and undressed. Doffed gown  with supervision and pants/soiled brief with  supervision in standing. Pt able to wash upper body and lower body and apply deodorant standing with supervision. Donned clean brief with mod A and clean hospital gown with set up assist. Sit<>semi-reclined independently. Concluded session with pt semi-reclined in bed, needs within reach, and bed alarm on. Provided pt with ensure, fresh ice water, and pants for therapy tomorrow as pt soiled current ones.   Therapy Documentation Precautions:  Precautions Precautions: Fall,Cervical Restrictions Weight Bearing Restrictions: No  Therapy/Group: Individual Therapy Alfonse Alpers PT, DPT   01/01/2021, 7:24 AM

## 2021-01-01 NOTE — Progress Notes (Signed)
Patient ID: Ashley [name], female   DOB: 03/14/1961, 60 y.o.   MRN: [id number]   Health and Human services information provided to pt.  [place], [place]

## 2021-01-01 NOTE — Progress Notes (Signed)
Patient ID: Ashley [name], female   DOB: Feb 04, 1961, 60 y.o.   MRN: [id number]   Pt referral faxed to [facility] for PT and OT follow up.  [place], [place]

## 2021-01-01 NOTE — Progress Notes (Signed)
Upon entering patient upset. "can't depend on family." Reports she's getting evicted from her apartment. Requesting to speak to SW. Refused scheduled miralax, senna S and neurontin. Continent of B&B, reports daily BM's. Anterior neck incision OTA. PRN oxy IR given at 2129. Usually requests before going to sleep, doesn't need during night. Sleeps good. Ashley [name] A

## 2021-01-01 NOTE — Progress Notes (Signed)
Occupational Therapy Session Note  Patient Details  Name: Ashley [name] MRN: [id number] Date of Birth: [birth year]  Today's Date: 01/01/2021 OT Individual Time: 0730-0830 OT Individual Time Calculation (min): 60 min    Short Term Goals: Week 1:  OT Short Term Goal 1 (Week 1): Pt will don pants/brief with min A with AE PRN. OT Short Term Goal 2 (Week 1): Pt will complete shower transfer with CGA with AE PRN. OT Short Term Goal 3 (Week 1): Pt will will complete standing ADL for >5 min with CGA. OT Short Term Goal 4 (Week 1): Pt will ind recall at least 3/5 cervical precautions in prep for safe ADL performance.  Skilled Therapeutic Interventions/Progress Updates:    Treatment session with focus on activity tolerance, dynamic standing balance, RUE NMR, and functional transfers. Pt received seated EOB reporting frustration with current housing situation and concerns, requesting to speak with SWK.  Message sent to [facility] to touch base.  Pt declined bathing/dressing this session, expressing desire to focus on RLE and RUE strengthening.  Pt completed ambulatory transfer bed > w/c with supervision without AD.  Pt completed 6 mins on resistance level 5 on Nustep with good reciprocal movements.  Engaged in [place] in sitting and standing progressing from use of peg board to pill bottles/pill box.  Pt dropping items ~10% of time with cues to visually attend to task to increase success.  Discussed use of alternative pill bottles and alternative dispensing methods to increase success with opening containers and filling pill box.  Completed pipe tree puzzle in standing with focus on fine and gross motor movements with RUE.  Pt ambulated 30' back to w/c with supervision.  Pt returned to room and reports need to toilet.  Pt ambulated to toilet with supervision and completed clothing management supervision.  Pt remained on toilet with RN arriving to provide morning meds.    Therapy Documentation Precautions:   Precautions Precautions: Fall,Cervical Restrictions Weight Bearing Restrictions: No General:   Vital Signs: Therapy Vitals Temp: 99.2 F (37.3 C) Pulse Rate: 81 Resp: 19 BP: (!) 154/75 Patient Position (if appropriate): Lying Oxygen Therapy O2 Device: Room Air Pain: Pain Assessment Pain Scale: 0-10 Pain Score: 0-No pain   Therapy/Group: Individual Therapy  Simonne Come 01/01/2021, 12:11 PM

## 2021-01-01 NOTE — Patient Care Conference (Signed)
Inpatient RehabilitationTeam Conference and Plan of Care Update Date: 01/01/2021   Time: 9:39 AM    Patient Name: Ashley [name]      Medical Record Number: [id number]  Date of Birth: [birth year] Sex: Female         Room/Bed: 7Q73A/1P37T-02 Payor Info: Payor: [place]  / Plan: [employer] / Product Type: *No Product type* /    Admit Date/Time:  12/28/2020  5:14 PM  Primary Diagnosis:  Cervical myelopathy [facility])  Hospital Problems: Principal Problem:   Cervical myelopathy ([place]) Active Problems:   Postoperative pain   Urinary incontinence   Slow transit constipation   Controlled type 2 diabetes mellitus with hyperglycemia ([place])   Schizophrenia ([place])   Bipolar affective disorder, current episode depressed [facility])    Expected Discharge Date: Expected Discharge Date: 01/03/21  Team Members Present: Physician leading conference: Dr. Alger Simons Care Coodinator Present: Dorthula Nettles, RN, BSN, CRRN;Christina [place], BSW Nurse Present: Dorthula Nettles, RN PT Present: Becky Sax, PT OT Present: Simonne Come, OT PPS Coordinator present : Gunnar Fusi, SLP     Current Status/Progress Goal Weekly Team Focus  Bowel/Bladder   continent of B&B, wears incontinence brief "to be on the safe side", refusing scheduled miralax and senna s. BM's daily  remain continent.  assess B&B status every shift and PRN   Swallow/Nutrition/ Hydration             ADL's   Min A bathing, Supervision dressing, CGA - Supervision ambulatory transfers without AD  Mod I  ADL retraining, dynamic standing balance, RUE NMR, d/c planning   Mobility   supervision bed mobility, supervision stand pivot transfers, CGA gait x 140 without AD, min assist 12 steps 1 rail  Mod I, supervision stairs  functional mobility/transfers, generalized strengthening, dynamic standing balance/coordination, ambulation, stair navigation, safety awareness, endurance   Communication             Safety/Cognition/  Behavioral Observations            Pain   C/O pain to neck, numbness & tingling to RUE & hand. Refusing scheduled neurontin-"doesn't work". Taking PRN Oxy IR 10mg  and PRN ultram.  Pain < 3 on pain scale  assess pain every shift and PRN.   Skin   anterior neck incision with skin glue-OTA  skin remain intact and incision without S&S of infection  assess skin every shift & PRN     Discharge Planning:  pt discharge home on Thurs with sister and 2 daughters to provide care   Team Discussion: Working on diabetes management, adjustment of pain management. Continent B/B, Oxycodone for neck pain, incision to neck is clean, dry and intact. Educating on HTN, stroke prevention (hx. 11/2020), and pain management. Scheduled to go home with sister and 2 daughters and they will provide care. Patient on target to meet rehab goals: Yes, motivated to participate, supervision over 150 ft, mod I goals, supervision with stairs. She was upset with her sister this morning, ambulated 30 ft with assistive device, min assist with bathing and dressing, mod I goals.   *See Care Plan and progress notes for long and short-term goals.   Revisions to Treatment Plan:  MD making medication adjustments.  Teaching Needs: Family educations, medication management, pain management, diabetes management, skin/wound care, transfer training, gait training, stair training, balance training, endurance training, safety awareness.  Current Barriers to Discharge: Decreased caregiver support, Medical stability, Home enviroment access/layout, Wound care, Lack of/limited family support, Medication compliance and Behavior  Possible Resolutions to Barriers: Continue current medications, provide emotional support.     Medical Summary Current Status: cervical myelopathy. pain mgt with oxycodone, gabapentin. emptying bladder and bowels currently. elevated cbg'ss  Barriers to Discharge: Medical stability   Possible Resolutions to  Barriers/Weekly Focus: ongoing diabetic mgt, adjustment of pain regimen, daily assessment of vs,patient data   Continued Need for Acute Rehabilitation Level of Care: The patient requires daily medical management by a physician with specialized training in physical medicine and rehabilitation for the following reasons: Direction of a multidisciplinary physical rehabilitation program to maximize functional independence : Yes Medical management of patient stability for increased activity during participation in an intensive rehabilitation regime.: Yes Analysis of laboratory values and/or radiology reports with any subsequent need for medication adjustment and/or medical intervention. : Yes   I attest that I was present, lead the team conference, and concur with the assessment and plan of the team.   Cristi Loron 01/01/2021, 12:29 PM

## 2021-01-01 NOTE — Progress Notes (Signed)
Patient ID: Ashley [name], female   DOB: December 30, 1960, 60 y.o.   MRN: [id number] Team Conference Report to Patient/Family  Team Conference discussion was reviewed with the patient and caregiver, including goals, any changes in plan of care and target discharge date.  Patient and caregiver express understanding and are in agreement.  The patient has a target discharge date of 01/03/21.  SW met with pt and daughter [name] in room. Pt and dtr confirmed pt will be d/c home with dtr to provide 24/7 care with daughter Clyde Canterbury. PT has resolved eviction concern, no longer barrier for d/c. Sw will provide pt and dtr with social services information for d/c. Dtr informed pt will need to follow up with PCP for colonoscopy. Pt set for d/c. Dtr will participate in family education today. NO DME reccommended.  Dyanne Iha 01/01/2021, 10:20 AM

## 2021-01-02 LAB — GLUCOSE, CAPILLARY
Glucose-Capillary: 83 mg/dL (ref 70–99)
Glucose-Capillary: 207 mg/dL — ABNORMAL HIGH (ref 70–99)
Glucose-Capillary: 267 mg/dL — ABNORMAL HIGH (ref 70–99)
Glucose-Capillary: 83 mg/dL (ref 70–99)

## 2021-01-02 MED ORDER — ACETAMINOPHEN 325 MG PO TABS: 325.0000 mg | ORAL_TABLET | ORAL | Status: DC | PRN

## 2021-01-02 NOTE — Discharge Instructions (Signed)
Inpatient Rehab Discharge Instructions  Ashley [name] Discharge date and time: 01/03/21   Activities/Precautions/ Functional Status: Activity: no lifting, driving, or strenuous exercise till cleared by MD Diet: diabetic diet Wound Care: keep wound clean and dry    Functional status:  ___ No restrictions     ___ Walk up steps independently _X__ 24/7 supervision/assistance   ___ Walk up steps with assistance ___ Intermittent supervision/assistance  ___ Bathe/dress independently ___ Walk with walker     ___ Bathe/dress with assistance ___ Walk Independently    ___ Shower independently ___ Walk with assistance    _X__ Shower with assistance _X__ No alcohol     ___ Return to work/school ________  Special Instructions:   COMMUNITY REFERRALS UPON DISCHARGE:    Outpatient: PT     OT                Agency: [place] ASUOR:[phone]              Appointment Date/Time: Facility To Determine at Discharge      My questions have been answered and I understand these instructions. I will adhere to these goals and the provided educational materials after my discharge from the hospital.  Patient/Caregiver Signature _______________________________ Date __________  Clinician Signature _______________________________________ Date __________  Please bring this form and your medication list with you to all your follow-up doctor's appointments.

## 2021-01-02 NOTE — Plan of Care (Signed)
Ashley [name] Ashley [name]  

## 2021-01-02 NOTE — Progress Notes (Signed)
Occupational Therapy Discharge Summary  Patient Details  Name: Ashley [name] MRN: [id number] Date of Birth: Mar 24, 1961   Patient has met 12 of 12 long term goals due to improved activity tolerance, improved balance, postural control, ability to compensate for deficits, functional use of  RIGHT upper extremity, improved attention and improved awareness.  Patient to discharge at overall Modified Independent level.  Patient's care partner is independent to provide the necessary physical assistance at discharge.  Patient to discharge home with daughter.  Daughter was present for PT session and demonstrated ability to provide care at pt's current level. Pt can perform functional mobility in room for ADL without AD, uses AD when carrying items, and does use RW for longer distances. Already has BSC per pt.   Reasons goals not met: NA   Recommendation:  Patient will benefit from ongoing skilled OT services in outpatient setting to continue to advance functional skills in the area of BADL and Reduce care partner burden.  Equipment: No equipment provided  Reasons for discharge: treatment goals met and discharge from hospital  Patient/family agrees with progress made and goals achieved: Yes  OT Discharge Precautions/Restrictions  Precautions Precautions: Fall;Cervical;Back General OT Amount of Missed Time: 50 Minutes Vital Signs Therapy Vitals Temp: 99 F (37.2 C) Temp Source: Oral Pulse Rate: 86 Resp: 18 BP: 119/72 Patient Position (if appropriate): Lying Oxygen Therapy SpO2: 99 % O2 Device: Room Air Pain Pain Assessment Pain Scale: 0-10 Pain Score: 0-No pain ADL ADL Eating: Independent Where Assessed-Eating: Edge of bed Grooming: Independent,Modified independent Where Assessed-Grooming: Sitting at sink Upper Body Bathing: Independent Where Assessed-Upper Body Bathing: Wheelchair Lower Body Bathing: Modified independent Where Assessed-Lower Body Bathing: Wheelchair Upper  Body Dressing: Modified independent (Device) Where Assessed-Upper Body Dressing: Wheelchair Lower Body Dressing: Modified independent Where Assessed-Lower Body Dressing: Wheelchair Toileting: Modified independent Where Assessed-Toileting: Medical laboratory scientific officer: Modified independent Armed forces technical officer Method: Ambulating Tub/Shower Transfer: Modified independent Tub/Shower Transfer Method: Squat pivot Tub/Shower Equipment: Radio broadcast assistant Vision Baseline Vision/History: Wears glasses Wears Glasses: At all times Patient Visual Report: No change from baseline Praxis Praxis: Intact Cognition Overall Cognitive Status: Within Functional Limits for tasks assessed Arousal/Alertness: Awake/alert Attention: Sustained Sustained Attention: Appears intact Memory: Appears intact Awareness: Appears intact Problem Solving: Appears intact Safety/Judgment: Appears intact Sensation Sensation Light Touch: Appears Intact Proprioception: Appears Intact Coordination Gross Motor Movements are Fluid and Coordinated: No Fine Motor Movements are Fluid and Coordinated: No Coordination and Movement Description: mild uncoordination due to mild R hemiparesis, generalized weakness, and decreased endurance Finger Nose Finger Test: dysmetria on RUE Heel Shin Test: decreased ROM bilaterally Motor  Motor Motor: Hemiplegia Motor - Skilled Clinical Observations: mild R hemiparesis Mobility  Bed Mobility Bed Mobility: Rolling Right;Rolling Left;Sit to Supine;Supine to Sit Rolling Right: Independent Rolling Left: Independent Supine to Sit: Independent Sit to Supine: Independent Transfers Sit to Stand: Independent Stand to Sit: Independent  Trunk/Postural Assessment  Cervical Assessment Cervical Assessment: Exceptions to [facility] (not checked due to cervical precautions) Thoracic Assessment Thoracic Assessment: Within Functional Limits Lumbar Assessment Lumbar Assessment: Within Functional  Limits Postural Control Postural Control: Within Functional Limits  Balance Balance Balance Assessed: Yes Static Sitting Balance Static Sitting - Balance Support: Feet supported;No upper extremity supported Static Sitting - Level of Assistance: 7: Independent Dynamic Sitting Balance Dynamic Sitting - Balance Support: Feet supported;No upper extremity supported Dynamic Sitting - Level of Assistance: 6: Modified independent (Device/Increase time) Static Standing Balance Static Standing - Balance Support: No upper extremity supported Static Standing - Level  of Assistance: 7: Independent Dynamic Standing Balance Dynamic Standing - Balance Support: Bilateral upper extremity supported (RW) Dynamic Standing - Level of Assistance: 5: Stand by assistance (supervision) Extremity/Trunk Assessment RUE Assessment RUE Assessment: Exceptions to [facility] General Strength Comments: mild R hemi but functional for ADL tasks LUE Assessment LUE Assessment: Within Functional Limits   HOXIE, [facility] 01/02/2021, 3:18 PM

## 2021-01-02 NOTE — Progress Notes (Signed)
Occupational Therapy Session Note  Patient Details  Name: Ashley [name] MRN: [id number] Date of Birth: [birth year]  Today's Date: 01/02/2021 OT Individual Time: 2458-0998 and 1400-1410 OT Individual Time Calculation (min): 8 min and 10 min (Missed 52 and 50 mins due to nausea)  Short Term Goals: Week 1:  OT Short Term Goal 1 (Week 1): Pt will don pants/brief with min A with AE PRN. OT Short Term Goal 2 (Week 1): Pt will complete shower transfer with CGA with AE PRN. OT Short Term Goal 3 (Week 1): Pt will will complete standing ADL for >5 min with CGA. OT Short Term Goal 4 (Week 1): Pt will ind recall at least 3/5 cervical precautions in prep for safe ADL performance.  Skilled Therapeutic Interventions/Progress Updates:    1) Attempted to see pt for self-care retraining and d/c planning, however pt asleep in bed and reporting extreme nausea this AM. Pt reports feeling that she may throw up.  Therapist obtained emesis bag and notified RN of pt complaints.  Pt reports wanting to engage in therapy session but feeling that she cannot at this time.  RN to administer nausea meds.  Therapist to return later to allow meds to work.  Returned to attempt at 0930, however pt reports feeling "awful" with blankets pulled overhead.  Therapist encouraged pt to attempt grad day session with PT at 1000.  Therapist will attempt again later in day.  Returned at [employer], however pt sound asleep.  Pt has missed time during PT session prior to this attempt.  Will plan to see during scheduled 2pm session.  MD aware of current situation.  MD reporting med causing nausea d/ced this morning.  2) Attempted to see pt to complete grad day and d/c however pt asleep upon arrival with emesis bag next to head.  Pt briefly awakened and reports recommendation to complete OT prior to d/c, however reports that she "can not" get out of the bed.  Therapist provided pt with fine motor control and theraputty handouts for home to continue  to address RUE impairments.  Pt with minimal engagement in education, due to fatigue and nausea.  Pt did request OT session prior to d/c home tomorrow.  Pt scheduled for short OT session prior to d/c with focus on self-care tasks and to reiterate HEP.  Pt asleep when therapist returned to confirm schedule for prior to d/c.   Therapy Documentation Precautions:  Precautions Precautions: Fall,Cervical,Back Restrictions Weight Bearing Restrictions: No Pain:  Pt with no c/o pain, reports nauseous.   Therapy/Group: Individual Therapy  Simonne Come 01/02/2021, 9:29 AM

## 2021-01-02 NOTE — Progress Notes (Signed)
Inpatient Rehabilitation Care Coordinator Discharge Note  The overall goal for the admission was met for:   Discharge location: Yes, home  Length of Stay: Yes, 6 days   Discharge activity level: Yes, CGS/Supervision  Home/community participation: Yes  Services provided included: MD, RD, PT, OT, RN, CM, TR, Pharmacy, Neuropsych and SW  Financial Services: Private Insurance: [place] offered to/list presented to:pt  Follow-up services arranged: Outpatient: [facility]  Comments (or additional information): PT OT NO DME  Patient/Family verbalized understanding of follow-up arrangements: Yes  Individual responsible for coordination of the follow-up plan: Clyde Canterbury [phone], [place], [phone]  Confirmed correct DME delivered: Dyanne Iha 01/02/2021    Dyanne Iha

## 2021-01-02 NOTE — Plan of Care (Signed)
  Problem: RH Simple Meal Prep Goal: LTG Patient will perform simple meal prep w/assist (OT) Description: LTG: Patient will perform simple meal prep with assistance, with/without cues (OT). Outcome: Not Applicable Flowsheets (Taken 01/02/2021 1524) LTG: Pt will perform simple meal prep with assistance level of: (D/c as not a focus at this time due to shortened LOS) -- Note: D/c as not a focus at this time due to shortened LOS   Problem: RH Light Housekeeping Goal: LTG Patient will perform light housekeeping w/assist (OT) Description: LTG: Patient will perform light housekeeping with assistance, with/without cues (OT). Outcome: Not Applicable Flowsheets (Taken 01/02/2021 1524) LTG: Pt will perform light housekeeping with assistance level of: (D/c as not a focus at this time due to shortened LOS) -- Note: D/c as not a focus at this time due to shortened LOS

## 2021-01-02 NOTE — Progress Notes (Signed)
Physical Therapy Discharge Summary  Patient Details  Name: Ashley [name] MRN: [id number] Date of Birth: May 07, 1961  Today's Date: 01/02/2021 PT Individual Time: 1000-1027 PT Individual Time Calculation (min): 27 min   Today's Date: 01/02/2021 PT Missed Time: 33 Minutes Missed Time Reason: Patient ill (Comment) (nausea)  Patient has met 9 of 9 long term goals due to improved activity tolerance, improved balance, improved postural control, increased strength, improved attention, improved awareness and improved coordination. Patient to discharge at an ambulatory level Supervision/Mod I for short distances. Patient's care partner is independent to provide the necessary physical assistance at discharge. Pt's daughter attended family education training on 3/29 and verbalized and demonstrated confidence with all tasks to ensure safe discharge home. Pt with no further questions regarding mobility.   All goals met  Recommendation:  Patient will benefit from ongoing skilled PT services in outpatient setting to continue to advance safe functional mobility, address ongoing impairments in generalized strengthening, dynamic standing balance/coordination, gait training, endurance, and to minimize fall risk.  Equipment: No equipment provided; pt already has RW  Reasons for discharge: treatment goals met  Patient/family agrees with progress made and goals achieved: Yes   Today's Interventions: Received pt sidelying in bed with all lights off. Pt reported feeling nauseous but with no episodes of emesis. Pt reported being unable to participate in OT this morning but planning to try this afternoon and still wishes to discharge home tomorrow and feels prepared. Pt agreed to sit EOB for therapist to perform brief assessment and transferred supine<>sitting EOB independently. RN present to administer medications and pt stating "my breath stinks"; suggested brushing teeth. Pt then reported urge to urinate. Donned  non-skid socks with total A and pt ambulated 42f without AD and supervision/mod I to bathroom. Pt able to toilet mod I and stood and washed hands and brushed teeth at sink Mod I. Provided pt with ginger ale and crackers to settle stomach and pt sat EOB and ate 4 packs of Saltines and had a few sips of ginger ale. Concluded session with pt sitting EOB, needs within reach, and bed alarm on. 33 minutes missed of skilled physical therapy due to nausea.   PT Discharge Precautions/Restrictions Precautions Precautions: Fall;Cervical;Back Restrictions Weight Bearing Restrictions: No Cognition Overall Cognitive Status: Within Functional Limits for tasks assessed Arousal/Alertness: Awake/alert Orientation Level: Oriented X4 Memory: Appears intact Awareness: Appears intact Problem Solving: Appears intact Safety/Judgment: Appears intact Sensation Sensation Light Touch: Appears Intact Proprioception: Appears Intact Coordination Gross Motor Movements are Fluid and Coordinated: No Fine Motor Movements are Fluid and Coordinated: No Coordination and Movement Description: mild uncoordination due to mild R hemiparesis, generalized weakness, and decreased endurance Finger Nose Finger Test: dysmetria on RUE Heel Shin Test: decreased ROM bilaterally Motor  Motor Motor: Hemiplegia Motor - Skilled Clinical Observations: mild R hemiparesis  Mobility Bed Mobility Bed Mobility: Rolling Right;Rolling Left;Sit to Supine;Supine to Sit Rolling Right: Independent Rolling Left: Independent Supine to Sit: Independent Sit to Supine: Independent Transfers Transfers: Sit to Stand;Stand to Sit;Stand Pivot Transfers Sit to Stand: Independent Stand to Sit: Independent Stand Pivot Transfers: Independent Stand Pivot Transfer Details (indicate cue type and reason): none Transfer (Assistive device): None Locomotion  Gait Ambulation: Yes Gait Assistance: Supervision/Verbal cueing Gait Distance (Feet): 150  Feet Assistive device: Rolling walker Gait Assistance Details: Verbal cues for precautions/safety Gait Assistance Details: verbal cues for energy conservation to take rest breaks as needed Gait Gait: Yes Gait Pattern: Impaired Gait Pattern: Decreased step length - left;Decreased step length -  right;Poor foot clearance - right;Poor foot clearance - left Gait velocity: decreased Stairs / Additional Locomotion Stairs: Yes Stairs Assistance: Supervision/Verbal cueing Stair Management Technique: One rail Left Number of Stairs: 12 Height of Stairs: 6 Ramp: Supervision/Verbal cueing (RW) Curb: Contact Guard/Touching assist (RUE support) Wheelchair Mobility Wheelchair Mobility: No  Trunk/Postural Assessment  Cervical Assessment Cervical Assessment: Exceptions to [facility] (not checked due to cervical precautions) Thoracic Assessment Thoracic Assessment: Within Functional Limits Lumbar Assessment Lumbar Assessment: Within Functional Limits Postural Control Postural Control: Within Functional Limits  Balance Balance Balance Assessed: Yes Static Sitting Balance Static Sitting - Balance Support: Feet supported;No upper extremity supported Static Sitting - Level of Assistance: 7: Independent Dynamic Sitting Balance Dynamic Sitting - Balance Support: Feet supported;No upper extremity supported Dynamic Sitting - Level of Assistance: 6: Modified independent (Device/Increase time) Static Standing Balance Static Standing - Balance Support: No upper extremity supported Static Standing - Level of Assistance: 7: Independent Dynamic Standing Balance Dynamic Standing - Balance Support: Bilateral upper extremity supported (RW) Dynamic Standing - Level of Assistance: 5: Stand by assistance (supervision) Dynamic Standing - Comments: with gait Extremity Assessment  RLE Assessment RLE Assessment: Exceptions to [facility] General Strength Comments: grossly generalized to 4/5 (except knee flexion and PF  3+/5) LLE Assessment LLE Assessment: Exceptions to [facility] General Strength Comments: grossly generalized to 4/5 (except knee flexion and PF 3+/5)  Alfonse Alpers PT, DPT  01/02/2021, 7:40 AM

## 2021-01-02 NOTE — Progress Notes (Signed)
[place] PHYSICAL MEDICINE & REHABILITATION PROGRESS NOTE  Subjective/Complaints:   Pt reports very nauseated this AM- most likely from Duloxetine- pt and I agree it's time to stop this medicine- not helping. And side effects are too much.  Anti-nausea is starting to work.   Also wants to follow up with me, not Dr Milagros Evener after d/c- hadn't really seen Dr Ranell Patrick.    ROS:   Pt denies SOB, abd pain, CP, N/V/C/D, and vision changes   Objective: Vital Signs: Blood pressure 119/72, pulse 86, temperature 99 F (37.2 C), temperature source Oral, resp. rate 18, height 5\' 7"  (1.702 m), weight 65.1 kg, SpO2 99 %. No results found. Recent Labs    12/31/20 0657  WBC 9.5  HGB 11.2*  HCT 34.4*  PLT 373   Recent Labs    12/31/20 0657  NA 138  K 4.0  CL 104  CO2 27  GLUCOSE 119*  BUN 27*  CREATININE 0.91  CALCIUM 9.9    Intake/Output Summary (Last 24 hours) at 01/02/2021 1851 Last data filed at 01/02/2021 1817 Gross per 24 hour  Intake 1088 ml  Output --  Net 1088 ml        Physical Exam: BP 119/72 (BP Location: Left Arm)   Pulse 86   Temp 99 F (37.2 C) (Oral)   Resp 18   Ht 5\' 7"  (1.702 m)   Wt 65.1 kg   SpO2 99%   BMI 22.48 kg/m       General: awake, alert, appropriate, laying in bed; light off initially; NAD HENT: conjugate gaze; oropharynx moist- missing a few teeth CV: regular rate; no JVD Pulmonary: CTA B/L; no W/R/R- good air movement GI: soft, mildly TTP diffusely;  ND, (+)BS- hyperactive-  Psychiatric: appropriate- interactive; cordial Neurological: Ox3 Musc: No edema in extremities.  No tenderness in extremities. Dysphonia, unchanged Motor: Right upper extremity: Shoulder abduction 2+/5, distally 3+-4 -/5, stable Left upper extremity: Shoulder abduction 3/5, distally 3+/5, unchanged Bilateral lower extremities: Hip flexion, knee extension 2+/5, ankle dorsiflexion 3/5   Assessment/Plan: 1. Functional deficits which require 3+ hours per day  of interdisciplinary therapy in a comprehensive inpatient rehab setting.  Physiatrist is providing close team supervision and 24 hour management of active medical problems listed below.  Physiatrist and rehab team continue to assess barriers to discharge/monitor patient progress toward functional and medical goals   Care Tool:  Bathing    Body parts bathed by patient: Right arm,Left arm,Chest,Abdomen,Front perineal area,Buttocks,Right upper leg,Left upper leg,Face   Body parts bathed by helper: Right lower leg,Left lower leg     Bathing assist Assist Level: Minimal Assistance - Patient > 75%     Upper Body Dressing/Undressing Upper body dressing   What is the patient wearing?: Dress,Hospital gown only    Upper body assist Assist Level: Supervision/Verbal cueing    Lower Body Dressing/Undressing Lower body dressing      What is the patient wearing?: Incontinence brief     Lower body assist Assist for lower body dressing: Supervision/Verbal cueing     Toileting Toileting    Toileting assist Assist for toileting: Minimal Assistance - Patient > 75% (with brief)     Transfers Chair/bed transfer  Transfers assist     Chair/bed transfer assist level: Independent     Locomotion Ambulation   Ambulation assist      Assist level: Supervision/Verbal cueing Assistive device: Walker-rolling Max distance: 113ft   Walk 10 feet activity   Assist  Assist level: Independent with assistive device Assistive device: Walker-rolling   Walk 50 feet activity   Assist    Assist level: Independent with assistive device Assistive device: Walker-rolling    Walk 150 feet activity   Assist    Assist level: Supervision/Verbal cueing Assistive device: Walker-rolling    Walk 10 feet on uneven surface  activity   Assist     Assist level: Supervision/Verbal cueing Assistive device: Aeronautical engineer Will patient use wheelchair  at discharge?: No   Wheelchair activity did not occur: N/A         Wheelchair 50 feet with 2 turns activity    Assist            Wheelchair 150 feet activity     Assist           Medical Problem List and Plan: 1.  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  Continue CIR  3/29- did stairs 18 and then 21 stairs yesterday with therapy- con't PT and OT  3/30- con't PT and OT with goal of d/c tomorrow- did miss some therapy today due to nausea, of note.  2.  Antithrombotics: -DVT/anticoagulation:  Pharmaceutical: Lovenox             -antiplatelet therapy: ASA resumed 3/25 and Plavix to be resumed on 03/29 3. Pain Management: Oxycodone prn   Gabapentin for neuropathy decrease to 300 3 times daily per patient preference due to "grogginess".   Appears to be controlled on 3/27  3/28- con't regimen- pt reports tingling isn't bothersome  3/29- pt reports pain OK- still having nerve pain, but we discussed how Duloxetine helps as well- wants me to stop Gabapentin- makes her feel weird. Stopped  3/30- we discussed trying another nerve pain med- she decided and I agree, to wait until sees in [place] clinic- d/c duloxetine due to side effects  Monitor with increased exertion  4. Mood: LCSW to follow for evaluation and support.              -antipsychotic agents: N/A 5. Neuropsych: This patient is not fully capable of making decisions on her own behalf. 6. Skin/Wound Care: Monitor incision for healing.              --routine pressure relief measures.  7. Fluids/Electrolytes/Nutrition: Monitor I/Os.   BMP within acceptable range on 3/26  3/30- BUN slightly elevated at 27- con't to encourage intake 8.T2DM with hyperglycemia: Hgb A1c-9.5. Monitor BS ac/hs  Levemir 40 daily  Levemir 10 nightly added on 3/26  Continue glucotrol               Continue SSI for elevated  BS--ensure between meals likely causing variation             Change to ensure max with meals and add CM restrictions to diet.  3/28- will see if was on insulin prior- BGs 70-191- con't regimen for now.  3/29- pt was on Levemir 40 units daily at home- have added night Levemir- pt OK with this- BP low to mid 100s in last 24 hours- con't regimen   3/30- BGs 83-267- was better until last 2 BG checks- will have pt f/u with outpt PCP about BGs             Monitor with increased mobility 9. Elevated BP: Monitor BP tid.  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             --Continue statin 11. Schizophrenia/H/o Bipolar d/o: Managed with Seroquel  See #1  3/28- will change Duloxetine to 20 mg QHS to help with nausea.  3/29- still mildly nauseated, but not enough needs meds- con't at night for now   3/30- stop Duloxetine 12.  Slow transit constipation: Change senna S to with supper            Bowel meds increased on 3/26  Patient intermittently refusing bowel meds  3/29- will stop Senokot since refusing for days and make miralax prn- pt still having BMs 13. Leucocytosis: Resolved 14. ABLA:              Hemoglobin 10.4 on 3/26, continue to monitor 15. Urinary incontinence: Foley placed 03/22 for surgery with 1000 cc return, DC'd on 3/26.   I/O cath as necessary  Only one PVR performed  UA/Ucx ordered  3/28- U/A (-) for UTI- will d/w pt in AM  3/29- pt voiding well- no caths- con't regimen/monitor 16/ Dispo  3/29- have asked to speak with SW about p[ossibly losing apartment- Also wants ot leave Thursday which appears OK.   3/30- d/c tomorrow    LOS: 5 days A FACE TO FACE EVALUATION WAS PERFORMED  Ashley [name] 01/02/2021, 6:51 PM

## 2021-01-03 ENCOUNTER — Other Ambulatory Visit (HOSPITAL_COMMUNITY): Payer: Self-pay | Admitting: Physical Medicine and Rehabilitation

## 2021-01-03 LAB — GLUCOSE, CAPILLARY: Glucose-Capillary: 113 mg/dL — ABNORMAL HIGH (ref 70–99)

## 2021-01-03 MED ORDER — OXYCODONE HCL 5 MG PO TABS: 5.0000 mg | ORAL_TABLET | Freq: Two times a day (BID) | ORAL | 0 refills | Status: DC | PRN

## 2021-01-03 MED ORDER — ADULT MULTIVITAMIN W/MINERALS CH: 1.0000 | ORAL_TABLET | Freq: Every day | ORAL | Status: DC

## 2021-01-03 MED ORDER — INSULIN DETEMIR 100 UNIT/ML FLEXPEN: PEN_INJECTOR | SUBCUTANEOUS | 0 refills | Status: DC

## 2021-01-03 MED ORDER — TRAMADOL HCL 50 MG PO TABS: 50.0000 mg | ORAL_TABLET | Freq: Four times a day (QID) | ORAL | 0 refills | Status: DC | PRN

## 2021-01-03 MED ORDER — LORATADINE 10 MG PO TABS: 10.0000 mg | ORAL_TABLET | Freq: Every day | ORAL | Status: DC

## 2021-01-03 MED FILL — oxyCODONE HCL 5 MG TABS: 5 | 7 days supply | Qty: 14 | Fill #0

## 2021-01-03 MED FILL — traMADol HCL 50 MG TABS: 50 | 7 days supply | Qty: 28 | Fill #0

## 2021-01-03 NOTE — Discharge Summary (Signed)
Physician Discharge Summary  Patient ID: Ashley [name] MRN: [id number] DOB/AGE: Jul 13, 1961 60 y.o.  Admit date: 12/28/2020 Discharge date: 01/03/2021  Discharge Diagnoses:  Principal Problem:   Cervical myelopathy ([place]) Active Problems:   Postoperative pain   Urinary incontinence   Slow transit constipation   Controlled type 2 diabetes mellitus with hyperglycemia (HCC)   Schizophrenia (HCC)   Bipolar affective disorder, current episode depressed [facility])   Discharged Condition: stable   Significant Diagnostic Studies: N/a   Labs:  Basic Metabolic Panel: Recent Labs  Lab 12/29/20 0520 12/31/20 0657  NA 136 138  K 4.1 4.0  CL 101 104  CO2 26 27  GLUCOSE 108* 119*  BUN 23 27*  CREATININE 0.84 0.91  CALCIUM 9.6 9.9    CBC: Recent Labs  Lab 12/29/20 0520 12/31/20 0657  WBC 10.2 9.5  NEUTROABS 5.7  --   HGB 10.4* 11.2*  HCT 32.5* 34.4*  MCV 92.9 93.2  PLT 326 373    CBG: Recent Labs  Lab 01/02/21 0604 01/02/21 1127 01/02/21 1541 01/02/21 2119 01/03/21 0555  GLUCAP 83 207* 267* 83 113*    Brief HPI:   Ashley [name] is a 60 y.o. female with history of T2DM, schizophrenia, bipolar d/o, HTN, neck pain with numbness bilateral hands and recent diagnosis of stroke due to transient slurred speech and right-sided weakness.  She was evaluated by Dr. 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  Dr. Venetia Constable recommended surgical decompression therefore DAPT placed on hold and patient underwent ACDF C3/C4 and 12/25/2020.  Postop cleared to start ASA on POD 3 and Plavix POD 7.  She did have improvement in strength  but continued to be limited by right hemiparesis with foot drop, balance deficits as well as decreased awareness of deficits.  CIR was recommended to functional decline.   Hospital Course: Ashley [name] was admitted to rehab 12/28/2020 for inpatient therapies to consist of PT, ST and OT at least three hours five days a week. Past admission physiatrist, therapy team and rehab RN have worked together to provide customized collaborative inpatient rehab. Blood pressures were monitored on TID basis and have been reasonably controlled.  She was started on bowel program with MOM on day of admission and Foley removed on 03/23.  Voiding function was monitored with PVR checks and bladder function has improved and she was voiding without difficulty by discharge.  Bowels were working well therefore she declined any bowel program or meds.Psychiatry was consulted for input on medication and she was started on Cymbalta 20 mg a day to help neuropathy/mood and to continue Seroquel 400 mg twice daily.   She continued to report issues with neuropathy but did not like side effects from gabapentin and declined its use.  Cymbalta was also discontinued due to nausea side effects and as patient felt this was ineffective.  Pain is currently controlled with as needed use of oxycodone and/or tramadol and she has been educated on taper post discharge.  Her diabetes has been monitored with ac/hs CBG checks and SSI was use prn for tighter BS control.  Low-dose Levemir added in p.m. for tighter control.  Plavix was resumed on 03/29.  Follow-up check of electrolytes is WNL but rising BUN noted and patient was advised to increase fluid intake.  Follow-up CBC showed leukocytosis has resolved and H&H is relatively stable.  Neck incision is C/D/I and is healing well without any signs or symptoms of infection.  She has made good gains and is currently modified independent to supervision level.  She will continue to receive follow-up outpatient  therapies after discharge.   Rehab course: During patient's stay in rehab weekly team conferences were held to monitor patient's progress, set goals and discuss barriers to discharge. At admission, patient required mod assist with ADLs and min assist with mobility. She  has had improvement in activity tolerance, balance, postural control as well as ability to compensate for deficits. She has had improvement in functional use RUE as well as improvement in awareness.  She is able to complete ADL tasks at modified independent level.She is able to ambulate short distances at modified independent to supervision level.  Family education was completed with daughter.    Discharge disposition: 01-Home or Self Care  Diet: Carb modified.   Special Instructions: 1. No driving or strenuous activity till cleared by MD.  Discharge Instructions    Ambulatory referral to Physical Medicine Rehab   Complete by: As directed    1-2 week TC appt     Allergies as of 01/03/2021   No Known Allergies     Medication List    STOP taking these medications   gabapentin 300 MG capsule Commonly known as: NEURONTIN   insulin aspart 100 UNIT/ML injection Commonly known as: novoLOG   senna-docusate 8.6-50 MG tablet Commonly known as: Senokot-S     TAKE these medications   acetaminophen 325 MG tablet Commonly known as: TYLENOL Take 1-2 tablets (325-650 mg total) by mouth every 4 (four) hours as needed for mild pain.   ascorbic acid 500 MG tablet Commonly known as: VITAMIN C Take 1 tablet (500 mg total) by mouth daily.   aspirin EC 81 MG tablet Take 1 tablet (81 mg total) by mouth daily. Swallow whole.   clopidogrel 75 MG tablet Commonly known as: PLAVIX Take 1 tablet (75 mg total) by mouth daily. Start taking only on 3/29   glipiZIDE 10 MG 24 hr tablet Commonly known as: GLUCOTROL XL Take 2 tablets (20 mg total) by mouth daily.   insulin detemir 100 UNIT/ML FlexPen Commonly known as: LEVEMIR 40  units in the morning and 10 units at bedtime. What changed:   how much to take  how to take this  when to take this  additional instructions   loratadine 10 MG tablet Commonly known as: CLARITIN Take 1 tablet (10 mg total) by mouth daily.   multivitamin with minerals Tabs tablet Take 1 tablet by mouth daily.   oxyCODONE 5 MG immediate release tablet--Rx#14 pills  Commonly known as: Roxicodone Take 1 tablet (5 mg total) by mouth every 12 (twelve) hours as needed. What changed:   medication strength  how much to take  when to take this  reasons to take this   QUEtiapine 400 MG tablet Commonly known as: SEROQUEL Take 1 tablet (400 mg total) by mouth 2 (two) times daily.   rosuvastatin 40 MG tablet Commonly known as: Crestor One tablet daily What changed:   how much to take  how to take this  when to take this  additional instructions   traMADol 50 MG tablet--Rx # 28 pills Commonly known as: ULTRAM Take 1 tablet (50 mg  total) by mouth every 6 (six) hours as needed for moderate pain.   TRUEplus Pen Needles 32G X 4 MM Misc Generic drug: Insulin Pen Needle Use to inject insulin.       Follow-up Information    Raulkar, Clide Deutscher, MD Follow up.   Specialty: Physical Medicine and Rehabilitation Why: Office will call you with follow up appointment Contact information: [address] [place] [address] [phone]        Elsie Stain, MD. Call.   Specialty: Pulmonary Disease Why: for post hospital follow up Contact information: [address] [place] [address] [phone]        Judith Part, MD. Call.   Specialty: Neurosurgery Why: for post op appointment Contact information: [place] [address] [phone]               Signed: Bary Leriche 01/04/2021, 5:11 PM

## 2021-01-03 NOTE — Progress Notes (Signed)
INPATIENT REHABILITATION DISCHARGE NOTE   Discharge instructions by:P. Love, PA  Verbalized understanding:Yes  Skin care/Wound care:N/A  Pain:Controlled:  IV's:  N/A  Tubes/Drains:None  Safety instructions:Given  Patient belongings:Packed for discharge  Discharged [id number]  Discharged via: Daughter  Notes:

## 2021-01-03 NOTE — Progress Notes (Signed)
[facility] PHYSICAL MEDICINE & REHABILITATION PROGRESS NOTE  Subjective/Complaints:   Pt reports no more nausea-  Denies any spasticity/spasms/tightness at this time.   Daughter picking her up today.  Educated pt on spasticity and how common it is after cervical injury of any kind.    ROS:   Pt denies SOB, abd pain, CP, N/V/C/D, and vision changes    Objective: Vital Signs: Blood pressure 111/66, pulse 84, temperature 98.4 F (36.9 C), resp. rate 18, height 5\' 7"  (1.702 m), weight 65.1 kg, SpO2 99 %. No results found. No results for input(s): WBC, HGB, HCT, PLT in the last 72 hours. No results for input(s): NA, K, CL, CO2, GLUCOSE, BUN, CREATININE, CALCIUM in the last 72 hours.  Intake/Output Summary (Last 24 hours) at 01/03/2021 0856 Last data filed at 01/03/2021 0413 Gross per 24 hour  Intake 1088 ml  Output --  Net 1088 ml        Physical Exam: BP 111/66 (BP Location: Right Arm)   Pulse 84   Temp 98.4 F (36.9 C)   Resp 18   Ht 5\' 7"  (1.702 m)   Wt 65.1 kg   SpO2 99%   BMI 22.48 kg/m        General: awake, alert, appropriate, sitting EOB, got dressed on her own, NAD HENT: conjugate gaze; oropharynx moist CV: regular rate; no JVD Pulmonary: CTA B/L; no W/R/R- good air movement GI: soft, NT, ND, (+)BS Psychiatric: appropriate, interactive Neurological: Ox3- no hoffman's- no increased tone/spasms seen  Musc: No edema in extremities.  No tenderness in extremities. Dysphonia, unchanged Motor:  RUE- biceps 3+/5 WE 3+/5, triceps 3+/5, grip 4-/5, finger abd 2+/5 LUE- biceps 4+/5, WE 4/5, triceps 4-/5, grip 4/5, finger abd 4/5 RLE- HF 4/5, KE 4/5, DF 4+/5, PF 4+/5 LLE- 5-5/ in same muscles   Assessment/Plan: 1. Functional deficits which require 3+ hours per day of interdisciplinary therapy in a comprehensive inpatient rehab setting.  Physiatrist is providing close team supervision and 24 hour management of active medical problems listed  below.  Physiatrist and rehab team continue to assess barriers to discharge/monitor patient progress toward functional and medical goals   Care Tool:  Bathing    Body parts bathed by patient: Right arm,Left arm,Chest,Abdomen,Front perineal area,Buttocks,Right upper leg,Left upper leg,Face   Body parts bathed by helper: Right lower leg,Left lower leg     Bathing assist Assist Level: Minimal Assistance - Patient > 75%     Upper Body Dressing/Undressing Upper body dressing   What is the patient wearing?: Dress,Hospital gown only    Upper body assist Assist Level: Supervision/Verbal cueing    Lower Body Dressing/Undressing Lower body dressing      What is the patient wearing?: Incontinence brief     Lower body assist Assist for lower body dressing: Supervision/Verbal cueing     Toileting Toileting    Toileting assist Assist for toileting: Minimal Assistance - Patient > 75% (with brief)     Transfers Chair/bed transfer  Transfers assist     Chair/bed transfer assist level: Independent     Locomotion Ambulation   Ambulation assist      Assist level: Supervision/Verbal cueing Assistive device: Walker-rolling Max distance: 160ft   Walk 10 feet activity   Assist     Assist level: Independent with assistive device Assistive device: Walker-rolling   Walk 50 feet activity   Assist    Assist level: Independent with assistive device Assistive device: Walker-rolling    Walk 150 feet activity  Assist    Assist level: Supervision/Verbal cueing Assistive device: Walker-rolling    Walk 10 feet on uneven surface  activity   Assist     Assist level: Supervision/Verbal cueing Assistive device: Walker-rolling   Wheelchair     Assist Will patient use wheelchair at discharge?: No   Wheelchair activity did not occur: N/A         Wheelchair 50 feet with 2 turns activity    Assist            Wheelchair 150 feet activity      Assist           Medical Problem List and Plan: 1.  Quadriparesis/central cord syndrome, R>L weakness with foot drop, balance deficits, decreased awareness secondary to cervical myelopathy status post decompression.  Continue CIR  3/29- did stairs 18 and then 21 stairs yesterday with therapy- con't PT and OT  3/30- con't PT and OT with goal of d/c tomorrow- did miss some therapy today due to nausea, of note.   3/31- d/c today- will need f/u with me in clinic- might need first appointment with Zella Ball, since needs to be seen in 1 month. Strength improving, but is still incomplete central cord syndrome nontraumatic.  2.  Antithrombotics: -DVT/anticoagulation:  Pharmaceutical: Lovenox             -antiplatelet therapy: ASA resumed 3/25 and Plavix to be resumed on 03/29 3. Pain Management: Oxycodone prn   Gabapentin for neuropathy decrease to 300 3 times daily per patient preference due to "grogginess".   Appears to be controlled on 3/27  3/28- con't regimen- pt reports tingling isn't bothersome  3/29- pt reports pain OK- still having nerve pain, but we discussed how Duloxetine helps as well- wants me to stop Gabapentin- makes her feel weird. Stopped  3/30- we discussed trying another nerve pain med- she decided and I agree, to wait until sees in [facility]- d/c duloxetine due to side effects  Monitor with increased exertion  4. Mood: LCSW to follow for evaluation and support.              -antipsychotic agents: N/A 5. Neuropsych: This patient is not fully capable of making decisions on her own behalf. 6. Skin/Wound Care: Monitor incision for healing.              --routine pressure relief measures.  7. Fluids/Electrolytes/Nutrition: Monitor I/Os.   BMP within acceptable range on 3/26  3/30- BUN slightly elevated at 27- con't to encourage intake 8.T2DM with hyperglycemia: Hgb A1c-9.5. Monitor BS ac/hs  Levemir 40 daily  Levemir 10 nightly added on 3/26  Continue glucotrol                Continue SSI for elevated BS--ensure between meals likely causing variation             Change to ensure max with meals and add CM restrictions to diet.  3/28- will see if was on insulin prior- BGs 70-191- con't regimen for now.  3/31- home on current regimen- f/u with PCP to f/u on CBGs 9. Elevated BP: Monitor BP tid.  3/29- BP 111/77- well controlled- -con't to monitor 10. L-MCA CVA: DAPT being resumed per NS recommendations.             --Continue statin 11. Schizophrenia/H/o Bipolar d/o: Managed with Seroquel  See #1  3/28- will change Duloxetine to 20 mg QHS to help with nausea.  3/29- still mildly nauseated, but not enough needs  meds- con't at night for now   3/30- stop Duloxetine 12.  Slow transit constipation: Change senna S to with supper            Bowel meds increased on 3/26  Patient intermittently refusing bowel meds  3/29- will stop Senokot since refusing for days and make miralax prn- pt still having BMs  3/31- had very large BM this AM- explained still might require laxatives at home due to [facility] compression.  13. Leucocytosis: Resolved 14. ABLA:              Hemoglobin 10.4 on 3/26, continue to monitor 15. Urinary incontinence: Foley placed 03/22 for surgery with 1000 cc return, DC'd on 3/26.   I/O cath as necessary  Only one PVR performed  UA/Ucx ordered  3/28- U/A (-) for UTI- will d/w pt in AM  3/29- pt voiding well- no caths- con't regimen/monitor 16/ Dispo  3/29- have asked to speak with SW about p[ossibly losing apartment- Also wants ot leave Thursday which appears OK.   3/30- d/c tomorrow  3/31- d/c today- spent 20 minutes on educating about spasticity  Spent a total of 35 minutes on total care today- d/w PA and d/c instructions and spasticity- >50% on coordination of care.   LOS: 6 days A FACE TO FACE EVALUATION WAS PERFORMED  Ashley [name] 01/03/2021, 8:56 AM

## 2021-01-03 NOTE — Progress Notes (Signed)
Occupational Therapy Session Note  Patient Details  Name: Ashley [name] MRN: [id number] Date of Birth: 09-19-1961  Today's Date: 01/03/2021 OT Individual Time: 8088-1103 OT Individual Time Calculation (min): 26 min    Short Term Goals: Week 1:  OT Short Term Goal 1 (Week 1): Pt will don pants/brief with min A with AE PRN. OT Short Term Goal 2 (Week 1): Pt will complete shower transfer with CGA with AE PRN. OT Short Term Goal 3 (Week 1): Pt will will complete standing ADL for >5 min with CGA. OT Short Term Goal 4 (Week 1): Pt will ind recall at least 3/5 cervical precautions in prep for safe ADL performance.   Skilled Therapeutic Interventions/Progress Updates:    Pt greeted at time of session sitting on EOB stating that she was leaving today at 9 am when sister comes to pick her up, saying she had already washed up and dressed self. Educate on role of OT for ADLs and as grad day to evaluate how she does with self care prior to DC. Ambulated around room and collected items, stood at sink for simulated bathing and dressing tasks, oral hygiene as well all with Mod I, no cues required for safety and good ROM noted to reach. Ambulated in room > bathroom and toilet transfer in same manner, able to perform clothing management Mod I no cues. Walked approx 100 ft room > elevators and wheelchair transport to take over for energy, taken to tub room and performed shower transfer Mod I as well with using suction cup grab bar, discussed where to purchase and plcaement in her tub. Pt back in room in bed with call bell in reach all needs met awaiting DC. Reviewed cervical precautions as well with pt recalling 3/3.   Therapy Documentation Precautions:  Precautions Precautions: Fall,Cervical,Back Restrictions Weight Bearing Restrictions: No     Therapy/Group: Individual Therapy  Viona Gilmore 01/03/2021, 7:10 AM

## 2021-01-07 ENCOUNTER — Telehealth: Payer: Self-pay | Admitting: Registered Nurse

## 2021-01-07 ENCOUNTER — Other Ambulatory Visit (HOSPITAL_COMMUNITY): Payer: Self-pay

## 2021-01-07 NOTE — Telephone Encounter (Signed)
Error

## 2021-01-07 NOTE — Telephone Encounter (Signed)
Transitional Care call  Patient name: Ashley [name]                                DOB: 03-Mar-1961  1. Are you/is patient experiencing any problems since coming home? No a. Are there any questions regarding any aspect of care? No 2. Are there any questions regarding medications administration/dosing? Ms. Clyde Canterbury ( daughter), states her mother [name] has resumed her home insulin, she was unable to fill her insulin prescription until Thursday 01/10/2021. She was instructed F/U with her PCP, she verbalizes understanding.  a. Are meds being taken as prescribed? Yes, see above regarding her insulin.  b. "Patient should review meds with caller to confirm" Medication List Reviewed.  3. Have there been any falls? No 4. Has Home Health been to the house and/or have they contacted you? No, this provider placed a call to [facility], they will call [name] daughter Clyde Canterbury.  a. If not, have you tried to contact them? See Above b. Can we help you contact them? See Above 5. Are bowels and bladder emptying properly? Yes a. Are there any unexpected incontinence issues? No b. If applicable, is patient following bowel/bladder programs? Na 6. Any fevers, problems with breathing, unexpected pain? No 7. Are there any skin problems or new areas of breakdown? No 8. Has the patient/family member arranged specialty MD follow up (ie cardiology/neurology/renal/surgical/etc.)?  [name] was instructed to call Dr Zada Finders and Dr Dema Severin office to schedule HFU appointments, she verbalizes understanding.  a. Can we help arrange? No 9. Does the patient need any other services or support that we can help arrange? No 10. Are caregivers following through as expected in assisting the patient? Yes 11. Has the patient quit smoking, drinking alcohol, or using drugs as recommended? (                        )  Appointment date/time 01/17/2021  arrival time 11:20 for 11:40 appointment with Dr Ranell Patrick. At  [place]

## 2021-01-16 ENCOUNTER — Ambulatory Visit: Payer: 59 | Admitting: Occupational Therapy

## 2021-01-16 ENCOUNTER — Ambulatory Visit: Payer: 59

## 2021-01-17 ENCOUNTER — Encounter: Payer: 59 | Attending: Physical Medicine and Rehabilitation | Admitting: Physical Medicine and Rehabilitation

## 2021-01-30 ENCOUNTER — Ambulatory Visit: Payer: 59 | Admitting: Neurology

## 2021-02-08 ENCOUNTER — Other Ambulatory Visit (HOSPITAL_COMMUNITY): Payer: Self-pay

## 2021-02-08 ENCOUNTER — Other Ambulatory Visit: Payer: Self-pay | Admitting: Critical Care Medicine

## 2021-02-08 DIAGNOSIS — Z794 Long term (current) use of insulin: Secondary | ICD-10-CM

## 2021-02-08 DIAGNOSIS — E1165 Type 2 diabetes mellitus with hyperglycemia: Secondary | ICD-10-CM

## 2021-02-08 MED ORDER — GLIPIZIDE ER 10 MG PO TB24
20.0000 mg | ORAL_TABLET | Freq: Every day | ORAL | 0 refills | Status: DC
  Filled 2021-02-08: qty 90, 45d supply, fill #0

## 2021-02-08 NOTE — Telephone Encounter (Signed)
Medication Refill - Medication:   glipiZIDE (GLUCOTROL XL) 10 MG 24 hr tablet   Has the patient contacted their pharmacy? Yes.  no refills left  Preferred Pharmacy (with phone number or street name):   [place], [place].  [address] [address]  Phone: [phone] Fax: [phone]    Agent: Please be advised that RX refills may take up to 3 business days. We ask that you follow-up with your pharmacy.

## 2021-02-27 ENCOUNTER — Inpatient Hospital Stay: Payer: 59

## 2021-03-11 ENCOUNTER — Other Ambulatory Visit: Payer: Self-pay | Admitting: Critical Care Medicine

## 2021-03-14 ENCOUNTER — Ambulatory Visit: Payer: 59 | Admitting: Neurology

## 2021-03-15 ENCOUNTER — Other Ambulatory Visit: Payer: Self-pay | Admitting: Physician Assistant

## 2021-03-15 DIAGNOSIS — Z1231 Encounter for screening mammogram for malignant neoplasm of breast: Secondary | ICD-10-CM

## 2021-03-18 ENCOUNTER — Ambulatory Visit
Admission: RE | Admit: 2021-03-18 | Discharge: 2021-03-18 | Disposition: A | Discharge status: Home / Self Care | Payer: 59 | Source: Ambulatory Visit | Attending: Physician Assistant | Admitting: Physician Assistant

## 2021-03-18 ENCOUNTER — Other Ambulatory Visit: Payer: Self-pay

## 2021-03-18 DIAGNOSIS — Z1231 Encounter for screening mammogram for malignant neoplasm of breast: Secondary | ICD-10-CM

## 2021-03-20 ENCOUNTER — Encounter: Payer: Self-pay | Admitting: Neurology

## 2021-03-20 ENCOUNTER — Ambulatory Visit: Payer: Medicare Other | Admitting: Neurology

## 2021-03-20 NOTE — Progress Notes (Deleted)
PATIENT: Ashley [name] DOB: November 12, 1960  REASON FOR VISIT: follow up HISTORY FROM: patient Primary Neurologist:  HISTORY OF PRESENT ILLNESS: Today 03/20/21 Ashley [name] is a 60 year old female with history of right arm and leg weakness, numbness of both hands, nonsensical speech.  MRI of the brain showed evidence of small cortical left parietal stroke.  Old basal ganglia stroke on the left. Went to the ER reporting left sided weakness in March 2022, Found to have severe C3-C4 cervical spinal stenosis with cord compression/cervical myelopathy due to large disc herniation.  She underwent C3-C4 ACDF on 12/25/2020. Went to inpatient rehab.   CTA Head and Neck 12/19/2020 negative for LVO. 1.5 x 0.9 cm irregular filling defect within the ascending aorta, consistent with thrombus, suspected to reflect changes of a recently ruptured plaque. Finding could serve as an embolic source.  Echocardiogram 12/21/20, EF 57%, mild left ventricular hypertrophy: Conclusion(s)/Recommendation(s): No intracardiac source of embolism detected on this transthoracic study. A transesophageal echocardiogram is recommended to exclude cardiac source of embolism if clinically indicated.   HISTORY  12/17/2020 Dr. Jannifer Franklin: Ms. Dunnigan is a 60 year old right-handed black female with a history of diabetes, dyslipidemia, bipolar disorder, peripheral vascular disease, and tobacco and marijuana use.  The patient indicates that she began having new issues about 3 weeks prior to this evaluation.  She comes in with her sister today.  The patient started having some speech difficulty with nonsensical speech.  She also developed some right arm and right leg weakness that has persisted.  The speech issue seems to have improved.  She denied any headache or any vision changes.  She has a history of some numbness in both hands, worse on the left.  She has a 71-month history of neck pain that is in the midportion of the neck without pain down either  arm.  She has noted some problems with urinary incontinence recently.  Since the stroke event, she has had falls, she has bumped her head on several occasions.  She has been set up for physical therapy with home health therapy but this has not yet started.  MRI of the brain was done and showed evidence of a small cortical left parietal stroke.  An old basal ganglia stroke was also noted on the left.  The patient is able to walk with a walker, she can no longer walk independently.  She comes to this office for further evaluation.  Her LDL cholesterol recently was noted to be 194, she is now on Crestor and was recently started on aspirin.  She smokes marijuana daily, she has been smoking 1-1/2 packs of cigarettes daily.  She denies any other illicit drugs such as cocaine use.  Her most recent hemoglobin A1c was 7.9.  REVIEW OF SYSTEMS: Out of a complete 14 system review of symptoms, the patient complains only of the following symptoms, and all other reviewed systems are negative.  ALLERGIES: No Known Allergies  HOME MEDICATIONS: Outpatient Medications Prior to Visit  Medication Sig Dispense Refill   acetaminophen (TYLENOL) 325 MG tablet Take 1-2 tablets (325-650 mg total) by mouth every 4 (four) hours as needed for mild pain.     ascorbic acid (VITAMIN C) 500 MG tablet Take 1 tablet (500 mg total) by mouth daily.     aspirin EC 81 MG tablet Take 1 tablet (81 mg total) by mouth daily. Swallow whole. 30 tablet 11   clopidogrel (PLAVIX) 75 MG tablet Take 1 tablet (75 mg total) by mouth daily. Start  taking only on 3/29 90 tablet 3   glipiZIDE (GLUCOTROL XL) 10 MG 24 hr tablet Take 2 tablets (20 mg total) by mouth daily. 90 tablet 0   insulin detemir (LEVEMIR) 100 UNIT/ML FlexPen INJECT 40 UNITS IN THE MORNING AND 10 UNITS AT BEDTIME. 15 mL 0   Insulin Pen Needle (TRUEPLUS PEN NEEDLES) 32G X 4 MM MISC Use to inject insulin. 100 each 11   loratadine (CLARITIN) 10 MG tablet Take 1 tablet (10 mg total) by  mouth daily.     Multiple Vitamin (MULTIVITAMIN WITH MINERALS) TABS tablet Take 1 tablet by mouth daily.     oxyCODONE (OXY IR/ROXICODONE) 5 MG immediate release tablet TAKE 1 TABLET (5 MG TOTAL) BY MOUTH EVERY 12 (TWELVE) HOURS AS NEEDED. 14 tablet 0   QUEtiapine (SEROQUEL) 400 MG tablet Take 1 tablet (400 mg total) by mouth 2 (two) times daily.     rosuvastatin (CRESTOR) 40 MG tablet One tablet daily (Patient taking differently: Take 40 mg by mouth daily.) 60 tablet 4   traMADol (ULTRAM) 50 MG tablet TAKE 1 TABLET (50 MG TOTAL) BY MOUTH EVERY 6 (SIX) HOURS AS NEEDED FOR MODERATE PAIN. 28 tablet 0   No facility-administered medications prior to visit.    PAST MEDICAL HISTORY: Past Medical History:  Diagnosis Date   Anxiety    Arthritis    Bipolar 1 disorder ([place])    Depression    Diabetes mellitus without complication (HCC)    Type II   GERD (gastroesophageal reflux disease)    Heart murmur    "nothing to worry about"   HTN (hypertension)    not on medication   Stroke ([place]) 11/2020    PAST SURGICAL HISTORY: Past Surgical History:  Procedure Laterality Date   ABDOMINAL HYSTERECTOMY     ANTERIOR CERVICAL DECOMP/DISCECTOMY FUSION N/A 12/25/2020   Procedure: CERVICAL THREE-FOUR ANTERIOR CERVICAL DECOMPRESSION/DISCECTOMY FUSION;  Surgeon: Judith Part, MD;  Location: [facility];  Service: Neurosurgery;  Laterality: N/A;  CERVICAL THREE-FOUR ANTERIOR CERVICAL DECOMPRESSION/DISCECTOMY FUSION    LUMBAR LAMINECTOMY/DECOMPRESSION MICRODISCECTOMY Left 04/07/2016   Procedure: Left Lumbar four-five Microdiskectomy;  Surgeon: Erline Levine, MD;  Location: [facility];  Service: Neurosurgery;  Laterality: Left;    FAMILY HISTORY: Family History  Problem Relation Age of Onset   Healthy Mother    Diabetes Mellitus II Father    Breast cancer Sister    Diabetes Brother    Heart disease Neg Hx    Stroke Neg Hx    Kidney disease Neg Hx     SOCIAL HISTORY: Social History   Socioeconomic  History   Marital status: Divorced    Spouse name: Not on file   Number of children: 3   Years of education: Not on file   Highest education level: High school graduate  Occupational History   Not on file  Tobacco Use   Smoking status: Every Day    Packs/day: 0.25    Years: 39.00    Pack years: 9.75    Types: Cigarettes   Smokeless tobacco: Never  Vaping Use   Vaping Use: Never used  Substance and Sexual Activity   Alcohol use: No   Drug use: Yes    Types: Marijuana    Comment: Cocaine -- 04/06/16- last time   Sexual activity: Yes    Birth control/protection: Surgical  Other Topics Concern   Not on file  Social History Narrative   Lives with daughter   Right Handed   Drinks 1 cup  of coffee per day      Social Determinants of Health   Financial Resource Strain: Not on file  Food Insecurity: Not on file  Transportation Needs: Not on file  Physical Activity: Not on file  Stress: Not on file  Social Connections: Not on file  Intimate Partner Violence: Not on file      PHYSICAL EXAM  There were no vitals filed for this visit. There is no height or weight on file to calculate BMI.  Generalized: Well developed, in no acute distress   Neurological examination  Mentation: Alert oriented to time, place, history taking. Follows all commands speech and language fluent Cranial nerve II-XII: Pupils were equal round reactive to light. Extraocular movements were full, visual field were full on confrontational test. Facial sensation and strength were normal. Uvula tongue midline. Head turning and shoulder shrug  were normal and symmetric. Motor: The motor testing reveals 5 over 5 strength of all 4 extremities. Good symmetric motor tone is noted throughout.  Sensory: Sensory testing is intact to soft touch on all 4 extremities. No evidence of extinction is noted.  Coordination: Cerebellar testing reveals good finger-nose-finger and heel-to-shin bilaterally.  Gait and station:  Gait is normal. Tandem gait is normal. Romberg is negative. No drift is seen.  Reflexes: Deep tendon reflexes are symmetric and normal bilaterally.   DIAGNOSTIC DATA (LABS, IMAGING, TESTING) - I reviewed patient records, labs, notes, testing and imaging myself where available.  Lab Results  Component Value Date   WBC 9.5 12/31/2020   HGB 11.2 (L) 12/31/2020   HCT 34.4 (L) 12/31/2020   MCV 93.2 12/31/2020   PLT 373 12/31/2020      Component Value Date/Time   NA 138 12/31/2020 0657   NA 138 11/12/2020 0940   K 4.0 12/31/2020 0657   CL 104 12/31/2020 0657   CO2 27 12/31/2020 0657   GLUCOSE 119 (H) 12/31/2020 0657   BUN 27 (H) 12/31/2020 0657   BUN 11 11/12/2020 0940   CREATININE 0.91 12/31/2020 0657   CREATININE 0.88 08/04/2016 0929   CALCIUM 9.9 12/31/2020 0657   PROT 6.4 (L) 12/29/2020 0520   PROT 6.8 11/12/2020 0940   ALBUMIN 3.3 (L) 12/29/2020 0520   ALBUMIN 4.4 11/12/2020 0940   AST 41 12/29/2020 0520   ALT 60 (H) 12/29/2020 0520   ALKPHOS 97 12/29/2020 0520   BILITOT 0.7 12/29/2020 0520   BILITOT <0.2 11/12/2020 0940   GFRNONAA >60 12/31/2020 0657   GFRAA 72 11/12/2020 0940   Lab Results  Component Value Date   CHOL 314 (H) 11/12/2020   HDL 42 11/12/2020   LDLCALC 194 (H) 11/12/2020   TRIG 386 (H) 11/12/2020   CHOLHDL 7.5 (H) 11/12/2020   Lab Results  Component Value Date   HGBA1C 9.5 (A) 11/12/2020   Lab Results  Component Value Date   VITAMINB12 555 05/21/2013   Lab Results  Component Value Date   TSH 0.970 11/12/2020      ASSESSMENT AND PLAN 60 y.o. year old female  has a past medical history of Anxiety, Arthritis, Bipolar 1 disorder ([place]), Depression, Diabetes mellitus without complication ([place]), GERD (gastroesophageal reflux disease), Heart murmur, HTN (hypertension), and Stroke ([place]) (11/2020). here with ***   I spent 15 minutes with the patient. 50% of this time was spent   Butler Denmark, [place], DNP 03/20/2021, 5:35 AM [facility] [address], [place], [place] [address] [phone]

## 2021-04-10 ENCOUNTER — Inpatient Hospital Stay: Payer: 59 | Admitting: Physician Assistant

## 2021-04-10 ENCOUNTER — Other Ambulatory Visit: Payer: Self-pay

## 2021-04-10 ENCOUNTER — Telehealth: Payer: 59 | Admitting: Physician Assistant

## 2021-04-10 VITALS — Ht 67.0 in

## 2021-04-10 DIAGNOSIS — D649 Anemia, unspecified: Secondary | ICD-10-CM | POA: Diagnosis not present

## 2021-04-10 DIAGNOSIS — F203 Undifferentiated schizophrenia: Secondary | ICD-10-CM | POA: Diagnosis not present

## 2021-04-10 DIAGNOSIS — G959 Disease of spinal cord, unspecified: Secondary | ICD-10-CM | POA: Diagnosis not present

## 2021-04-10 DIAGNOSIS — R7989 Other specified abnormal findings of blood chemistry: Secondary | ICD-10-CM | POA: Diagnosis not present

## 2021-04-10 MED ORDER — QUETIAPINE FUMARATE 400 MG PO TABS: 400.0000 mg | ORAL_TABLET | Freq: Two times a day (BID) | ORAL | 1 refills | Status: DC

## 2021-04-10 MED ORDER — ACETAMINOPHEN 325 MG PO TABS: 325.0000 mg | ORAL_TABLET | ORAL | 1 refills | Status: DC | PRN

## 2021-04-10 NOTE — Progress Notes (Signed)
Patient verified DOB Patient has taken medication and patient has not eaten today. Patient denies pain at this time. Patient reports Seroquel has been helping with the mood and she feels fine today.

## 2021-04-10 NOTE — Progress Notes (Signed)
Established Patient Office Visit  Subjective:  Patient ID: Ashley [name], female    DOB: 30-Mar-1961  Age: 60 y.o. MRN: [id number]   Virtual Visit via Telephone Note  I connected with Ashley [name] on 04/10/21 at  2:20 PM EDT by telephone and verified that I am speaking with the correct person using two identifiers.  CC; hospital follow-up Location: Patient: at home  Provider: Primary Care at [facility]    I discussed the limitations, risks, security and privacy concerns of performing an evaluation and management service by telephone and the availability of in person appointments. I also discussed with the patient that there may be a patient responsible charge related to this service. The patient expressed understanding and agreed to proceed.   History of Present Illness: Reports that she was hospitalized from December 28, 2020 to January 03, 2021.  Hospital note    Brief HPI:   Ashley [name] is a 60 y.o. female with history of T2DM, schizophrenia, bipolar d/o, HTN, neck pain with numbness bilateral hands and recent diagnosis of stroke due to transient slurred speech and right-sided weakness.  She was evaluated by Dr. 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  Dr. Venetia Constable recommended surgical decompression therefore DAPT placed on hold and patient underwent ACDF C3/C4 and 12/25/2020.  Postop cleared to start ASA on POD 3 and Plavix POD 7.  She did have improvement in strength but continued to be limited by right hemiparesis with foot drop, balance deficits as well as decreased awareness of deficits.  CIR was recommended to  functional decline.     Hospital Course: Ashley [name] was admitted to rehab 12/28/2020 for inpatient therapies to consist of PT, ST and OT at least three hours five days a week. Past admission physiatrist, therapy team and rehab RN have worked together to provide customized collaborative inpatient rehab. Blood pressures were monitored on TID basis and have been reasonably controlled.  She was started on bowel program with MOM on day of admission and Foley removed on 03/23.  Voiding function was monitored with PVR checks and bladder function has improved and she was voiding without difficulty by discharge.  Bowels were working well therefore she declined any bowel program or meds.Psychiatry was consulted for input on medication and she was started on Cymbalta 20 mg a day to help neuropathy/mood and to continue Seroquel 400 mg twice daily.   She continued to report issues with neuropathy but did not like side effects from gabapentin and declined its use.  Cymbalta was also discontinued due to nausea side effects and as patient felt this was ineffective.  Pain is currently controlled with as needed use of oxycodone and/or tramadol and she has been educated on taper post discharge.  Her diabetes has been monitored with ac/hs CBG checks and SSI was use prn for tighter BS control.  Low-dose Levemir added in p.m. for tighter control.  Plavix was resumed on 03/29.  Follow-up check of electrolytes is WNL but rising BUN noted and patient was advised to increase fluid intake.  Follow-up CBC showed leukocytosis has resolved and H&H is relatively stable.  Neck incision is C/D/I and is healing  well without any signs or symptoms of infection.  She has made good gains and is currently modified independent to supervision level.  She will continue to receive follow-up outpatient therapies after discharge.     Rehab course: During patient's stay in rehab weekly team conferences were held to monitor patient's progress, set  goals and discuss barriers to discharge. At admission, patient required mod assist with ADLs and min assist with mobility. She  has had improvement in activity tolerance, balance, postural control as well as ability to compensate for deficits. She has had improvement in functional use RUE as well as improvement in awareness.  She is able to complete ADL tasks at modified independent level.She is able to ambulate short distances at modified independent to supervision level.  Family education was completed with daughter.    States today that she did complete all of her physical therapy appointments.  Reports that she continues to have pain in her right arm, states that this has been present since her surgery.  Reports that she has been using tramadol at bedtime with some relief.  Requests refill of the tramadol.  States that she has been out of it now "for a long time".  No other concerns at this time  Observations/Objective: Medical history and current medications reviewed, no physical exam completed  Lab   Past Medical History:  Diagnosis Date   Anxiety    Arthritis    Bipolar 1 disorder ([place])    Depression    Diabetes mellitus without complication (HCC)    Type II   GERD (gastroesophageal reflux disease)    Heart murmur    "nothing to worry about"   HTN (hypertension)    not on medication   Stroke ([place]) 11/2020    Past Surgical History:  Procedure Laterality Date   ABDOMINAL HYSTERECTOMY     ANTERIOR CERVICAL DECOMP/DISCECTOMY FUSION N/A 12/25/2020   Procedure: CERVICAL THREE-FOUR ANTERIOR CERVICAL DECOMPRESSION/DISCECTOMY FUSION;  Surgeon: Judith Part, MD;  Location: [place];  Service: Neurosurgery;  Laterality: N/A;  CERVICAL THREE-FOUR ANTERIOR CERVICAL DECOMPRESSION/DISCECTOMY FUSION    LUMBAR LAMINECTOMY/DECOMPRESSION MICRODISCECTOMY Left 04/07/2016   Procedure: Left Lumbar four-five Microdiskectomy;  Surgeon: Erline Levine, MD;  Location: [place] [facility];  Service:  Neurosurgery;  Laterality: Left;    Family History  Problem Relation Age of Onset   Healthy Mother    Diabetes Mellitus II Father    Breast cancer Sister    Diabetes Brother    Heart disease Neg Hx    Stroke Neg Hx    Kidney disease Neg Hx     Social History   Socioeconomic History   Marital status: Divorced    Spouse name: Not on file   Number of children: 3   Years of education: Not on file   Highest education level: High school graduate  Occupational History   Not on file  Tobacco Use   Smoking status: Every Day    Packs/day: 0.25    Years: 39.00    Pack years: 9.75    Types: Cigarettes   Smokeless tobacco: Never  Vaping Use   Vaping Use: Never used  Substance and Sexual Activity   Alcohol use: No   Drug use: Yes    Types: Marijuana    Comment: Cocaine -- 04/06/16- last time   Sexual activity: Yes    Birth control/protection: Surgical  Other Topics Concern   Not on file  Social History Narrative   Lives with daughter   Right Handed  Drinks 1 cup of coffee per day      Social Determinants of Health   Financial Resource Strain: Not on file  Food Insecurity: Not on file  Transportation Needs: Not on file  Physical Activity: Not on file  Stress: Not on file  Social Connections: Not on file  Intimate Partner Violence: Not on file    Outpatient Medications Prior to Visit  Medication Sig Dispense Refill   ascorbic acid (VITAMIN C) 500 MG tablet Take 1 tablet (500 mg total) by mouth daily.     aspirin EC 81 MG tablet Take 1 tablet (81 mg total) by mouth daily. Swallow whole. 30 tablet 11   clopidogrel (PLAVIX) 75 MG tablet Take 1 tablet (75 mg total) by mouth daily. Start taking only on 3/29 90 tablet 3   glipiZIDE (GLUCOTROL XL) 10 MG 24 hr tablet Take 2 tablets (20 mg total) by mouth daily. 90 tablet 0   insulin detemir (LEVEMIR) 100 UNIT/ML FlexPen INJECT 40 UNITS IN THE MORNING AND 10 UNITS AT BEDTIME. 15 mL 0   Insulin Pen Needle (TRUEPLUS PEN NEEDLES)  32G X 4 MM MISC Use to inject insulin. 100 each 11   rosuvastatin (CRESTOR) 40 MG tablet One tablet daily (Patient taking differently: Take 40 mg by mouth daily.) 60 tablet 4   traMADol (ULTRAM) 50 MG tablet TAKE 1 TABLET (50 MG TOTAL) BY MOUTH EVERY 6 (SIX) HOURS AS NEEDED FOR MODERATE PAIN. 28 tablet 0   acetaminophen (TYLENOL) 325 MG tablet Take 1-2 tablets (325-650 mg total) by mouth every 4 (four) hours as needed for mild pain.     QUEtiapine (SEROQUEL) 400 MG tablet Take 1 tablet (400 mg total) by mouth 2 (two) times daily.     loratadine (CLARITIN) 10 MG tablet Take 1 tablet (10 mg total) by mouth daily. (Patient not taking: Reported on 04/10/2021)     Multiple Vitamin (MULTIVITAMIN WITH MINERALS) TABS tablet Take 1 tablet by mouth daily. (Patient not taking: Reported on 04/10/2021)     oxyCODONE (OXY IR/ROXICODONE) 5 MG immediate release tablet TAKE 1 TABLET (5 MG TOTAL) BY MOUTH EVERY 12 (TWELVE) HOURS AS NEEDED. (Patient not taking: Reported on 04/10/2021) 14 tablet 0   No facility-administered medications prior to visit.    No Known Allergies  ROS Review of Systems  Constitutional: Negative.   HENT: Negative.    Eyes: Negative.   Respiratory:  Negative for shortness of breath.   Cardiovascular:  Negative for chest pain.  Gastrointestinal: Negative.   Endocrine: Negative.   Genitourinary: Negative.   Musculoskeletal:  Positive for arthralgias and myalgias.  Skin: Negative.   Allergic/Immunologic: Negative.   Neurological: Negative.   Hematological: Negative.   Psychiatric/Behavioral: Negative.       Objective:      Ht 5\' 7"  (1.702 m)   BMI 22.48 kg/m  Wt Readings from Last 3 Encounters:  12/28/20 143 lb 8.3 oz (65.1 kg)  12/20/20 143 lb 8.3 oz (65.1 kg)  12/17/20 145 lb (65.8 kg)     Health Maintenance Due  Topic Date Due   Pneumococcal Vaccine 53-22 Years old (1 - PCV) Never done   Zoster Vaccines- Shingrix (1 of 2) Never done   COLON CANCER SCREENING ANNUAL FOBT   05/20/2014   COVID-19 Vaccine (3 - Moderna risk series) 10/31/2020   OPHTHALMOLOGY EXAM  11/30/2020   URINE MICROALBUMIN  05/10/2021    There are no preventive care reminders to display for this patient.  Lab Results  Component Value Date   TSH 0.970 11/12/2020   Lab Results  Component Value Date   WBC 9.5 12/31/2020   HGB 11.2 (L) 12/31/2020   HCT 34.4 (L) 12/31/2020   MCV 93.2 12/31/2020   PLT 373 12/31/2020   Lab Results  Component Value Date   NA 138 12/31/2020   K 4.0 12/31/2020   CO2 27 12/31/2020   GLUCOSE 119 (H) 12/31/2020   BUN 27 (H) 12/31/2020   CREATININE 0.91 12/31/2020   BILITOT 0.7 12/29/2020   ALKPHOS 97 12/29/2020   AST 41 12/29/2020   ALT 60 (H) 12/29/2020   PROT 6.4 (L) 12/29/2020   ALBUMIN 3.3 (L) 12/29/2020   CALCIUM 9.9 12/31/2020   ANIONGAP 7 12/31/2020   Lab Results  Component Value Date   CHOL 314 (H) 11/12/2020   Lab Results  Component Value Date   HDL 42 11/12/2020   Lab Results  Component Value Date   LDLCALC 194 (H) 11/12/2020   Lab Results  Component Value Date   TRIG 386 (H) 11/12/2020   Lab Results  Component Value Date   CHOLHDL 7.5 (H) 11/12/2020   Lab Results  Component Value Date   HGBA1C 9.5 (A) 11/12/2020      Assessment & [name]:   Problem List Items Addressed This Visit       Nervous and Auditory   Cervical myelopathy (HCC) - Primary   Relevant Medications   acetaminophen (TYLENOL) 325 MG tablet     Other   Schizophrenia (HCC)   Relevant Medications   QUEtiapine (SEROQUEL) 400 MG tablet   Other Visit Diagnoses     Elevated LFTs       Relevant Orders   Comp. Metabolic Panel (12)   Anemia, unspecified type       Relevant Orders   CBC with Differential/Platelet       Meds ordered this encounter  Medications   QUEtiapine (SEROQUEL) 400 MG tablet    Sig: Take 1 tablet (400 mg total) by mouth 2 (two) times daily.    Dispense:  60 tablet    Refill:  1    Order Specific Question:    Supervising Provider    Answer:   Elsie Stain [[id number]]   acetaminophen (TYLENOL) 325 MG tablet    Sig: Take 1-2 tablets (325-650 mg total) by mouth every 4 (four) hours as needed for mild pain.    Dispense:  60 tablet    Refill:  1    Order Specific Question:   Supervising Provider    Answer:   Asencion Noble E [[id number]]   Assessment and [name]: 1. Cervical myelopathy ([place]) Patient did request refill of tramadol, however patient did admit that she had not been trying Tylenol.  Patient agreeable to trial of Tylenol.  Patient was given appointment to follow-up with her primary care provider on May 28, 2021.  [facility].  Red flags given for prompt reevaluation  - acetaminophen (TYLENOL) 325 MG tablet; Take 1-2 tablets (325-650 mg total) by mouth every 4 (four) hours as needed for mild pain.  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  Dispense: 60 tablet; Refill: 1  3. Elevated LFTs  - Comp. Metabolic Panel (12); Future  4. Anemia, unspecified type  - CBC with Differential/Platelet; Future  Follow Up Instructions:    I discussed the assessment and treatment [name] with the patient. The patient was provided an opportunity to ask questions and all were answered. The patient agreed with the [name] and demonstrated an understanding of the instructions.   The patient was advised to call back or seek an in-person evaluation if the symptoms worsen or if the condition fails to improve as anticipated.  I provided 21 minutes of non-face-to-face time during this encounter.    Follow-up: Return in about 7 weeks (around 05/28/2021) for At [facility].    Loraine Grip Mayers, PA-C

## 2021-04-11 ENCOUNTER — Other Ambulatory Visit (HOSPITAL_COMMUNITY): Payer: Self-pay

## 2021-04-11 ENCOUNTER — Other Ambulatory Visit: Payer: Self-pay

## 2021-04-11 ENCOUNTER — Ambulatory Visit: Payer: 59 | Attending: Critical Care Medicine

## 2021-04-11 ENCOUNTER — Encounter: Payer: Self-pay | Admitting: Physician Assistant

## 2021-04-11 DIAGNOSIS — R7989 Other specified abnormal findings of blood chemistry: Secondary | ICD-10-CM

## 2021-04-11 DIAGNOSIS — D649 Anemia, unspecified: Secondary | ICD-10-CM

## 2021-04-12 LAB — CBC WITH DIFFERENTIAL/PLATELET
Basophils Absolute: 0.1 10*3/uL (ref 0.0–0.2)
Basos: 1 %
EOS (ABSOLUTE): 0.3 10*3/uL (ref 0.0–0.4)
Eos: 3 %
Hematocrit: 43.2 % (ref 34.0–46.6)
Hemoglobin: 13.6 g/dL (ref 11.1–15.9)
Immature Grans (Abs): 0 10*3/uL (ref 0.0–0.1)
Immature Granulocytes: 0 %
Lymphocytes Absolute: 4.3 10*3/uL — ABNORMAL HIGH (ref 0.7–3.1)
Lymphs: 41 %
MCH: 28.9 pg (ref 26.6–33.0)
MCHC: 31.5 g/dL (ref 31.5–35.7)
MCV: 92 fL (ref 79–97)
Monocytes Absolute: 0.6 10*3/uL (ref 0.1–0.9)
Monocytes: 6 %
Neutrophils Absolute: 4.9 10*3/uL (ref 1.4–7.0)
Neutrophils: 49 %
Platelets: 449 10*3/uL (ref 150–450)
RBC: 4.7 x10E6/uL (ref 3.77–5.28)
RDW: 15.6 % — ABNORMAL HIGH (ref 11.7–15.4)
WBC: 10.3 10*3/uL (ref 3.4–10.8)

## 2021-04-12 LAB — COMP. METABOLIC PANEL (12)
AST: 36 IU/L (ref 0–40)
Albumin/Globulin Ratio: 1.6 (ref 1.2–2.2)
Albumin: 5.1 g/dL — ABNORMAL HIGH (ref 3.8–4.8)
Alkaline Phosphatase: 169 IU/L — ABNORMAL HIGH (ref 44–121)
BUN/Creatinine Ratio: 17 (ref 12–28)
BUN: 21 mg/dL (ref 8–27)
Bilirubin Total: 0.2 mg/dL (ref 0.0–1.2)
Calcium: 10.9 mg/dL — ABNORMAL HIGH (ref 8.7–10.3)
Chloride: 103 mmol/L (ref 96–106)
Creatinine, Ser: 1.23 mg/dL — ABNORMAL HIGH (ref 0.57–1.00)
Globulin, Total: 3.2 g/dL (ref 1.5–4.5)
Glucose: 90 mg/dL (ref 65–99)
Potassium: 4.1 mmol/L (ref 3.5–5.2)
Sodium: 143 mmol/L (ref 134–144)
Total Protein: 8.3 g/dL (ref 6.0–8.5)
eGFR: 50 mL/min/{1.73_m2} — ABNORMAL LOW (ref >59)

## 2021-04-15 ENCOUNTER — Telehealth: Payer: Self-pay | Admitting: *Deleted

## 2021-04-15 DIAGNOSIS — R7989 Other specified abnormal findings of blood chemistry: Secondary | ICD-10-CM | POA: Insufficient documentation

## 2021-04-15 NOTE — Addendum Note (Signed)
Addended by: Kennieth Rad on: 04/15/2021 08:49 AM   Modules accepted: Orders

## 2021-04-15 NOTE — Telephone Encounter (Signed)
-----   Message from Kennieth Rad, [place] sent at 04/15/2021  8:49 AM EDT ----- Please call patient and let her know that her kidney function has worsened, I do encourage her to increase her hydration, and have these levels rechecked at the end of this week.  She does have an elevated liver enzyme, she should avoid ibuprofen and alcohol at this time, we will do further testing at the end of this week for this as well.  She does not show signs of anemia.

## 2021-04-15 NOTE — Telephone Encounter (Signed)
Patient verified DOB Patient is aware of kidney function worsening and liver enzyme levels being elevated. Patient has been scheduled for Friday at 9am to have repeat labs completed and was advised to abstain from alcohol and ibuprofen for the week

## 2021-04-19 ENCOUNTER — Other Ambulatory Visit: Payer: Self-pay

## 2021-04-19 ENCOUNTER — Ambulatory Visit: Payer: 59 | Attending: Physician Assistant

## 2021-04-19 DIAGNOSIS — R7989 Other specified abnormal findings of blood chemistry: Secondary | ICD-10-CM

## 2021-04-20 LAB — COMP. METABOLIC PANEL (12)
AST: 42 IU/L — ABNORMAL HIGH (ref 0–40)
Albumin/Globulin Ratio: 1.7 (ref 1.2–2.2)
Albumin: 4.6 g/dL (ref 3.8–4.8)
Alkaline Phosphatase: 164 IU/L — ABNORMAL HIGH (ref 44–121)
BUN/Creatinine Ratio: 15 (ref 12–28)
BUN: 18 mg/dL (ref 8–27)
Bilirubin Total: <0.2 mg/dL (ref 0.0–1.2)
Calcium: 10 mg/dL (ref 8.7–10.3)
Chloride: 103 mmol/L (ref 96–106)
Creatinine, Ser: 1.23 mg/dL — ABNORMAL HIGH (ref 0.57–1.00)
Globulin, Total: 2.7 g/dL (ref 1.5–4.5)
Glucose: 159 mg/dL — ABNORMAL HIGH (ref 65–99)
Potassium: 3.9 mmol/L (ref 3.5–5.2)
Sodium: 141 mmol/L (ref 134–144)
Total Protein: 7.3 g/dL (ref 6.0–8.5)
eGFR: 50 mL/min/{1.73_m2} — ABNORMAL LOW (ref >59)

## 2021-04-23 ENCOUNTER — Telehealth: Payer: Self-pay | Admitting: *Deleted

## 2021-04-23 NOTE — Telephone Encounter (Signed)
MA spoke with rep Venora Maples to add test [id number].

## 2021-04-25 LAB — HCV INTERPRETATION

## 2021-04-25 LAB — ACUTE VIRAL HEPATITIS (HAV, HBV, HCV)
HCV Ab: 0.2 s/co ratio (ref 0.0–0.9)
Hep A IgM: NEGATIVE
Hep B C IgM: NEGATIVE
Hepatitis B Surface Ag: NEGATIVE

## 2021-04-25 LAB — SPECIMEN STATUS REPORT

## 2021-04-29 ENCOUNTER — Telehealth: Payer: Self-pay | Admitting: Critical Care Medicine

## 2021-04-29 NOTE — Telephone Encounter (Signed)
PT calls asking for a call from nurse regarding latest results asap. Thank you

## 2021-04-30 ENCOUNTER — Telehealth (INDEPENDENT_AMBULATORY_CARE_PROVIDER_SITE_OTHER): Payer: Self-pay

## 2021-04-30 NOTE — Addendum Note (Signed)
Addended by: Kennieth Rad on: 04/30/2021 03:16 PM   Modules accepted: Orders

## 2021-04-30 NOTE — Telephone Encounter (Signed)
Copied from [place] [id number]. Topic: General - Call Back - No Documentation >> Apr 26, 2021  1:30 PM Lennox Solders wrote: Reason for CRM: Pt is calling and would like lab work results from 04-19-2021   Please address patient lab results from 7/15.

## 2021-04-30 NOTE — Telephone Encounter (Signed)
Results sent.

## 2021-05-01 ENCOUNTER — Telehealth: Payer: Self-pay | Admitting: *Deleted

## 2021-05-01 NOTE — Telephone Encounter (Signed)
Medical Assistant left message on patient's home and cell voicemail. Voicemail states to give a call back to [place] with [facility]. Patient is aware of Hep C panel being negative and an [place] being ordered for further review. Per signed DPR message was left on VM.

## 2021-05-01 NOTE — Telephone Encounter (Signed)
-----   Message from Kennieth Rad, [place] sent at 04/30/2021  3:16 PM EDT ----- Please call patient and let her know that her hepatitis panel was negative, this was ordered due to her elevated liver enzymes.  I will order an ultrasound of her liver for further review

## 2021-05-01 NOTE — Telephone Encounter (Signed)
MA contacted patient today with no answer x3.

## 2021-05-28 ENCOUNTER — Ambulatory Visit: Payer: 59 | Admitting: Critical Care Medicine

## 2021-05-29 ENCOUNTER — Ambulatory Visit: Payer: 59 | Attending: Critical Care Medicine | Admitting: Physician Assistant

## 2021-05-29 ENCOUNTER — Encounter: Payer: Self-pay | Admitting: Physician Assistant

## 2021-05-29 ENCOUNTER — Other Ambulatory Visit: Payer: Self-pay

## 2021-05-29 DIAGNOSIS — F203 Undifferentiated schizophrenia: Secondary | ICD-10-CM

## 2021-05-29 DIAGNOSIS — E782 Mixed hyperlipidemia: Secondary | ICD-10-CM

## 2021-05-29 DIAGNOSIS — E1165 Type 2 diabetes mellitus with hyperglycemia: Secondary | ICD-10-CM

## 2021-05-29 DIAGNOSIS — Z8673 Personal history of transient ischemic attack (TIA), and cerebral infarction without residual deficits: Secondary | ICD-10-CM

## 2021-05-29 DIAGNOSIS — Z794 Long term (current) use of insulin: Secondary | ICD-10-CM

## 2021-05-29 MED ORDER — GLIPIZIDE ER 10 MG PO TB24: 20.0000 mg | ORAL_TABLET | Freq: Every day | ORAL | 3 refills | Status: DC

## 2021-05-29 MED ORDER — QUETIAPINE FUMARATE 400 MG PO TABS: 400.0000 mg | ORAL_TABLET | Freq: Two times a day (BID) | ORAL | 1 refills | Status: DC

## 2021-05-29 MED ORDER — ROSUVASTATIN CALCIUM 40 MG PO TABS: 40.0000 mg | ORAL_TABLET | Freq: Every day | ORAL | 1 refills | Status: DC

## 2021-05-29 MED ORDER — INSULIN DETEMIR 100 UNIT/ML FLEXPEN: PEN_INJECTOR | SUBCUTANEOUS | 2 refills | Status: DC

## 2021-05-29 MED ORDER — CLOPIDOGREL BISULFATE 75 MG PO TABS: 75.0000 mg | ORAL_TABLET | Freq: Every day | ORAL | 1 refills | Status: DC

## 2021-05-29 NOTE — Progress Notes (Signed)
Patient ID: Ashley [name], female   DOB: 03-30-1961, 60 y.o.   MRN: [id number] Virtual Visit via Telephone Note  I connected with [name] on 05/29/21 at  3:30 PM EDT by telephone and verified that I am speaking with the correct person using two identifiers.  Location: Patient: home Provider: [facility] office   I discussed the limitations, risks, security and privacy concerns of performing an evaluation and management service by telephone and the availability of in person appointments. I also discussed with the patient that there may be a patient responsible charge related to this service. The patient expressed understanding and agreed to proceed.   History of Present Illness: needs RF on meds.  Out of glipizide for about 2 weeks.  Taking levemir 40 units daily.  Not checking blood sugars.  I reviewed her labs from July with her.    No new issues or concerns today.   Observations/Objective:  NAD.  A&Ox3   Assessment and Plan: 1. Type 2 diabetes mellitus with hyperglycemia, with long-term current use of insulin (HCC) Will assess control with A1C.  Ive encouraged her to check her blood sugars at home.   - glipiZIDE (GLUCOTROL XL) 10 MG 24 hr tablet; Take 2 tablets (20 mg total) by mouth daily.  Dispense: 60 tablet; Refill: 3 - insulin detemir (LEVEMIR) 100 UNIT/ML FlexPen; INJECT 40 UNITS IN THE MORNING AND 10 UNITS AT BEDTIME.  Dispense: 15 mL; Refill: 2 - Hemoglobin A1c; Future  2. Undifferentiated schizophrenia (HCC) - QUEtiapine (SEROQUEL) 400 MG tablet; Take 1 tablet (400 mg total) by mouth 2 (two) times daily.  Dispense: 60 tablet; Refill: 1  3. History of stroke - clopidogrel (PLAVIX) 75 MG tablet; Take 1 tablet (75 mg total) by mouth daily. Start taking only on 3/29  Dispense: 90 tablet; Refill: 1  4. Mixed dyslipidemia - rosuvastatin (CRESTOR) 40 MG tablet; Take 1 tablet (40 mg total) by mouth daily.  Dispense: 90 tablet; Refill: 1    Follow Up Instructions: See PCP in  3 months   I discussed the assessment and treatment plan with the patient. The patient was provided an opportunity to ask questions and all were answered. The patient agreed with the plan and demonstrated an understanding of the instructions.   The patient was advised to call back or seek an in-person evaluation if the symptoms worsen or if the condition fails to improve as anticipated.  I provided 16 minutes of non-face-to-face time during this encounter.   Freeman Caldron, PA-C

## 2021-06-04 ENCOUNTER — Ambulatory Visit: Payer: 59 | Attending: Critical Care Medicine

## 2021-06-04 ENCOUNTER — Other Ambulatory Visit: Payer: Self-pay

## 2021-06-04 DIAGNOSIS — Z794 Long term (current) use of insulin: Secondary | ICD-10-CM

## 2021-06-04 DIAGNOSIS — E1165 Type 2 diabetes mellitus with hyperglycemia: Secondary | ICD-10-CM

## 2021-06-05 LAB — HEMOGLOBIN A1C
Est. average glucose Bld gHb Est-mCnc: 180 mg/dL
Hgb A1c MFr Bld: 7.9 % — ABNORMAL HIGH (ref 4.8–5.6)

## 2021-06-12 ENCOUNTER — Telehealth: Payer: Self-pay | Admitting: *Deleted

## 2021-06-24 ENCOUNTER — Other Ambulatory Visit: Payer: Self-pay | Admitting: Physician Assistant

## 2021-06-24 DIAGNOSIS — F203 Undifferentiated schizophrenia: Secondary | ICD-10-CM

## 2021-06-24 NOTE — Telephone Encounter (Signed)
Requested medications are due for refill today No, just filled today  Requested medications are on the active medication list yes  Last refill 06/24/21  Last visit 05/2021  Future visit scheduled 08/06/21  Notes to clinic Not Delegated, requesting early.

## 2021-08-06 ENCOUNTER — Ambulatory Visit: Payer: 59 | Admitting: Critical Care Medicine

## 2021-08-06 NOTE — Progress Notes (Incomplete)
Established Patient Office Visit  Subjective:  Patient ID: Ashley [name], female    DOB: 02-07-1961  Age: 60 y.o. MRN: [id number]  CC: No chief complaint on file.   HPI Ashley [name] presents for ***  Past Medical History:  Diagnosis Date   Anxiety    Arthritis    Bipolar 1 disorder ([place])    Depression    Diabetes mellitus without complication (HCC)    Type II   GERD (gastroesophageal reflux disease)    Heart murmur    "nothing to worry about"   HTN (hypertension)    not on medication   Stroke ([place]) 11/2020    Past Surgical History:  Procedure Laterality Date   ABDOMINAL HYSTERECTOMY     ANTERIOR CERVICAL DECOMP/DISCECTOMY FUSION N/A 12/25/2020   Procedure: CERVICAL THREE-FOUR ANTERIOR CERVICAL DECOMPRESSION/DISCECTOMY FUSION;  Surgeon: Judith Part, MD;  Location: [facility];  Service: Neurosurgery;  Laterality: N/A;  CERVICAL THREE-FOUR ANTERIOR CERVICAL DECOMPRESSION/DISCECTOMY FUSION    LUMBAR LAMINECTOMY/DECOMPRESSION MICRODISCECTOMY Left 04/07/2016   Procedure: Left Lumbar four-five Microdiskectomy;  Surgeon: Erline Levine, MD;  Location: [facility];  Service: Neurosurgery;  Laterality: Left;    Family History  Problem Relation Age of Onset   Healthy Mother    Diabetes Mellitus II Father    Breast cancer Sister    Diabetes Brother    Heart disease Neg Hx    Stroke Neg Hx    Kidney disease Neg Hx     Social History   Socioeconomic History   Marital status: Divorced    Spouse name: Not on file   Number of children: 3   Years of education: Not on file   Highest education level: High school graduate  Occupational History   Not on file  Tobacco Use   Smoking status: Every Day    Packs/day: 0.25    Years: 39.00    Pack years: 9.75    Types: Cigarettes   Smokeless tobacco: Never  Vaping Use   Vaping Use: Never used  Substance and Sexual Activity   Alcohol use: No   Drug use: Yes    Types: Marijuana    Comment:  Cocaine -- 04/06/16- last time   Sexual activity: Yes    Birth control/protection: Surgical  Other Topics Concern   Not on file  Social History Narrative   Lives with daughter   Right Handed   Drinks 1 cup of coffee per day      Social Determinants of Health   Financial Resource Strain: Not on file  Food Insecurity: Not on file  Transportation Needs: Not on file  Physical Activity: Not on file  Stress: Not on file  Social Connections: Not on file  Intimate Partner Violence: Not on file    Outpatient Medications Prior to Visit  Medication Sig Dispense Refill   acetaminophen (TYLENOL) 325 MG tablet Take 1-2 tablets (325-650 mg total) by mouth every 4 (four) hours as needed for mild pain. 60 tablet 1   ascorbic acid (VITAMIN C) 500 MG tablet Take 1 tablet (500 mg total) by mouth daily.     aspirin EC 81 MG tablet Take 1 tablet (81 mg total) by mouth daily. Swallow whole. 30 tablet 11   clopidogrel (PLAVIX) 75 MG tablet Take 1 tablet (75 mg total) by mouth daily. Start taking only on 3/29 90 tablet 1   glipiZIDE (GLUCOTROL XL) 10 MG 24 hr tablet Take 2 tablets (20 mg total) by mouth daily. 60 tablet  3   insulin detemir (LEVEMIR) 100 UNIT/ML FlexPen INJECT 40 UNITS IN THE MORNING AND 10 UNITS AT BEDTIME. 15 mL 2   Insulin Pen Needle (TRUEPLUS PEN NEEDLES) 32G X 4 MM MISC Use to inject insulin. 100 each 11   loratadine (CLARITIN) 10 MG tablet Take 1 tablet (10 mg total) by mouth daily. (Patient not taking: Reported on 04/10/2021)     Multiple Vitamin (MULTIVITAMIN WITH MINERALS) TABS tablet Take 1 tablet by mouth daily. (Patient not taking: Reported on 04/10/2021)     QUEtiapine (SEROQUEL) 400 MG tablet Take 1 tablet (400 mg total) by mouth 2 (two) times daily. 60 tablet 1   rosuvastatin (CRESTOR) 40 MG tablet Take 1 tablet (40 mg total) by mouth daily. 90 tablet 1   No facility-administered medications prior to visit.    No Known Allergies  ROS Review of Systems     Objective:    Physical Exam  There were no vitals taken for this visit. Wt Readings from Last 3 Encounters:  12/28/20 143 lb 8.3 oz (65.1 kg)  12/20/20 143 lb 8.3 oz (65.1 kg)  12/17/20 145 lb (65.8 kg)     Health Maintenance Due  Topic Date Due   Pneumococcal Vaccine 45-69 Years old (1 - PCV) Never done   Zoster Vaccines- Shingrix (1 of 2) Never done   COLON CANCER SCREENING ANNUAL FOBT  05/20/2014   COVID-19 Vaccine (3 - Moderna risk series) 10/31/2020   OPHTHALMOLOGY EXAM  11/30/2020   INFLUENZA VACCINE  Never done   URINE MICROALBUMIN  05/10/2021   FOOT EXAM  06/08/2021    There are no preventive care reminders to display for this patient.  Lab Results  Component Value Date   TSH 0.970 11/12/2020   Lab Results  Component Value Date   WBC 10.3 04/11/2021   HGB 13.6 04/11/2021   HCT 43.2 04/11/2021   MCV 92 04/11/2021   PLT 449 04/11/2021   Lab Results  Component Value Date   NA 141 04/19/2021   K 3.9 04/19/2021   CO2 27 12/31/2020   GLUCOSE 159 (H) 04/19/2021   BUN 18 04/19/2021   CREATININE 1.23 (H) 04/19/2021   BILITOT <0.2 04/19/2021   ALKPHOS 164 (H) 04/19/2021   AST 42 (H) 04/19/2021   ALT 60 (H) 12/29/2020   PROT 7.3 04/19/2021   ALBUMIN 4.6 04/19/2021   CALCIUM 10.0 04/19/2021   ANIONGAP 7 12/31/2020   EGFR 50 (L) 04/19/2021   Lab Results  Component Value Date   CHOL 314 (H) 11/12/2020   Lab Results  Component Value Date   HDL 42 11/12/2020   Lab Results  Component Value Date   LDLCALC 194 (H) 11/12/2020   Lab Results  Component Value Date   TRIG 386 (H) 11/12/2020   Lab Results  Component Value Date   CHOLHDL 7.5 (H) 11/12/2020   Lab Results  Component Value Date   HGBA1C 7.9 (H) 06/04/2021      Assessment & Plan:   Problem List Items Addressed This Visit   None   No orders of the defined types were placed in this encounter.   Follow-up: No follow-ups on file.    Asencion Noble, MD

## 2021-08-19 ENCOUNTER — Other Ambulatory Visit: Payer: Self-pay | Admitting: Critical Care Medicine

## 2021-08-19 DIAGNOSIS — F203 Undifferentiated schizophrenia: Secondary | ICD-10-CM

## 2021-08-19 NOTE — Telephone Encounter (Signed)
Requested medication (s) are due for refill today - yes  Requested medication (s) are on the active medication list -yes  Future visit scheduled -yes  Last refill: 06/26/21 #60 1 RF  Notes to clinic: Request RF: non delegated Rx  Requested Prescriptions  Pending Prescriptions Disp Refills   QUEtiapine (SEROQUEL) 400 MG tablet [Pharmacy Med Name: quetiapine 400 mg tablet] 60 tablet 1    Sig: Take 1 tablet (400 mg total) by mouth 2 (two) times daily.     Not Delegated - Psychiatry:  Antipsychotics - Second Generation (Atypical) - quetiapine Failed - 08/19/2021  9:48 AM      Failed - This refill cannot be delegated      Failed - ALT in normal range and within 180 days    ALT  Date Value Ref Range Status  12/29/2020 60 (H) 0 - 44 U/L Final          Failed - AST in normal range and within 180 days    AST  Date Value Ref Range Status  04/19/2021 42 (H) 0 - 40 IU/L Final          Passed - Completed PHQ-2 or PHQ-9 in the last 360 days      Passed - Last BP in normal range    BP Readings from Last 1 Encounters:  01/03/21 111/66          Passed - Valid encounter within last 6 months    Recent Outpatient Visits           2 months ago History of stroke   [place], [place], [place]   8 months ago Cerebrovascular accident (CVA), unspecified mechanism ([place])   [place] Elsie Stain, MD   9 months ago Type 2 diabetes mellitus with hyperglycemia, with long-term current use of insulin [facility])   [place] Elsie Stain, MD   9 months ago Uncontrolled type 2 diabetes mellitus with hyperglycemia [facility])   [place] Elsie Stain, MD   12 months ago Encounter for smoking cessation counseling   [place], RPH-CPP       Future Appointments             In 2 weeks Elsie Stain, MD  [place]               Requested Prescriptions  Pending Prescriptions Disp Refills   QUEtiapine (SEROQUEL) 400 MG tablet [Pharmacy Med Name: quetiapine 400 mg tablet] 60 tablet 1    Sig: Take 1 tablet (400 mg total) by mouth 2 (two) times daily.     Not Delegated - Psychiatry:  Antipsychotics - Second Generation (Atypical) - quetiapine Failed - 08/19/2021  9:48 AM      Failed - This refill cannot be delegated      Failed - ALT in normal range and within 180 days    ALT  Date Value Ref Range Status  12/29/2020 60 (H) 0 - 44 U/L Final          Failed - AST in normal range and within 180 days    AST  Date Value Ref Range Status  04/19/2021 42 (H) 0 - 40 IU/L Final          Passed - Completed PHQ-2 or PHQ-9 in the last 360 days  Passed - Last BP in normal range    BP Readings from Last 1 Encounters:  01/03/21 111/66          Passed - Valid encounter within last 6 months    Recent Outpatient Visits           2 months ago History of stroke   [place], [place], [place]   8 months ago Cerebrovascular accident (CVA), unspecified mechanism [facility])   [place] Elsie Stain, MD   9 months ago Type 2 diabetes mellitus with hyperglycemia, with long-term current use of insulin [facility])   [place] Elsie Stain, MD   9 months ago Uncontrolled type 2 diabetes mellitus with hyperglycemia [facility])   [place] Elsie Stain, MD   12 months ago Encounter for smoking cessation counseling   [place] [facility] [place], RPH-CPP       Future Appointments             In 2 weeks Joya Gaskins Burnett Harry, MD [place]

## 2021-09-03 ENCOUNTER — Other Ambulatory Visit: Payer: Self-pay

## 2021-09-03 ENCOUNTER — Ambulatory Visit: Payer: 59 | Attending: Critical Care Medicine | Admitting: Critical Care Medicine

## 2021-09-03 ENCOUNTER — Encounter: Payer: Self-pay | Admitting: Critical Care Medicine

## 2021-09-03 VITALS — BP 144/88 | Resp 16 | Wt 144.0 lb

## 2021-09-03 DIAGNOSIS — F17210 Nicotine dependence, cigarettes, uncomplicated: Secondary | ICD-10-CM

## 2021-09-03 DIAGNOSIS — M5136 Other intervertebral disc degeneration, lumbar region: Secondary | ICD-10-CM

## 2021-09-03 DIAGNOSIS — F3132 Bipolar disorder, current episode depressed, moderate: Secondary | ICD-10-CM

## 2021-09-03 DIAGNOSIS — E782 Mixed hyperlipidemia: Secondary | ICD-10-CM | POA: Diagnosis not present

## 2021-09-03 DIAGNOSIS — G8250 Quadriplegia, unspecified: Secondary | ICD-10-CM

## 2021-09-03 DIAGNOSIS — Z2820 Immunization not carried out because of patient decision for unspecified reason: Secondary | ICD-10-CM

## 2021-09-03 DIAGNOSIS — Z9889 Other specified postprocedural states: Secondary | ICD-10-CM

## 2021-09-03 DIAGNOSIS — Z1211 Encounter for screening for malignant neoplasm of colon: Secondary | ICD-10-CM

## 2021-09-03 DIAGNOSIS — F203 Undifferentiated schizophrenia: Secondary | ICD-10-CM | POA: Diagnosis not present

## 2021-09-03 DIAGNOSIS — M51369 Other intervertebral disc degeneration, lumbar region without mention of lumbar back pain or lower extremity pain: Secondary | ICD-10-CM

## 2021-09-03 DIAGNOSIS — Z794 Long term (current) use of insulin: Secondary | ICD-10-CM

## 2021-09-03 DIAGNOSIS — Z720 Tobacco use: Secondary | ICD-10-CM

## 2021-09-03 DIAGNOSIS — E1165 Type 2 diabetes mellitus with hyperglycemia: Secondary | ICD-10-CM | POA: Diagnosis not present

## 2021-09-03 DIAGNOSIS — I10 Essential (primary) hypertension: Secondary | ICD-10-CM

## 2021-09-03 DIAGNOSIS — E559 Vitamin D deficiency, unspecified: Secondary | ICD-10-CM

## 2021-09-03 DIAGNOSIS — N289 Disorder of kidney and ureter, unspecified: Secondary | ICD-10-CM

## 2021-09-03 LAB — GLUCOSE, POCT (MANUAL RESULT ENTRY): POC Glucose: 435 mg/dl — AB (ref 70–99)

## 2021-09-03 LAB — POCT GLYCOSYLATED HEMOGLOBIN (HGB A1C): HbA1c, POC (controlled diabetic range): 10.6 % — AB (ref 0.0–7.0)

## 2021-09-03 MED ORDER — GLIPIZIDE ER 10 MG PO TB24
20.0000 mg | ORAL_TABLET | Freq: Every day | ORAL | 4 refills | Status: DC
  Filled 2021-09-03: qty 60, 30d supply, fill #0

## 2021-09-03 MED ORDER — MELOXICAM 15 MG PO TABS
15.0000 mg | ORAL_TABLET | Freq: Every day | ORAL | 1 refills | Status: DC
  Filled 2021-09-03: qty 30, 30d supply, fill #0

## 2021-09-03 MED ORDER — QUETIAPINE FUMARATE 400 MG PO TABS
400.0000 mg | ORAL_TABLET | Freq: Two times a day (BID) | ORAL | 1 refills | Status: DC
  Filled 2021-09-03: qty 60, 30d supply, fill #0

## 2021-09-03 MED ORDER — ROSUVASTATIN CALCIUM 40 MG PO TABS
40.0000 mg | ORAL_TABLET | Freq: Every day | ORAL | 1 refills | Status: DC
  Filled 2021-09-03: qty 30, 30d supply, fill #0

## 2021-09-03 MED ORDER — TRUE METRIX BLOOD GLUCOSE TEST VI STRP
ORAL_STRIP | 12 refills | Status: DC
  Filled 2021-09-03: qty 100, 30d supply, fill #0

## 2021-09-03 MED ORDER — TECHLITE PEN NEEDLES 32G X 4 MM MISC
11 refills | Status: DC
  Filled 2021-09-03: qty 100, 30d supply, fill #0

## 2021-09-03 MED ORDER — INSULIN DETEMIR 100 UNIT/ML FLEXPEN
PEN_INJECTOR | SUBCUTANEOUS | 2 refills | Status: DC
  Filled 2021-09-03: qty 15, 30d supply, fill #0

## 2021-09-03 MED ORDER — TRUE METRIX METER W/DEVICE KIT
PACK | 0 refills | Status: DC
  Filled 2021-09-03: qty 1, 30d supply, fill #0

## 2021-09-03 MED ORDER — TRUEPLUS LANCETS 28G MISC
1 refills | Status: DC
  Filled 2021-09-03: qty 100, 30d supply, fill #0

## 2021-09-03 NOTE — Patient Instructions (Signed)
Refills on medications sent to our pharmacy  New glucose meter was sent to the pharmacy with testing supplies  Urine sample for protein will be obtained also colon cancer screening kit was issued  You declined vaccines  Return to see [name] in 2 months

## 2021-09-03 NOTE — Assessment & Plan Note (Signed)
Uncontrolled type 2 diabetes with elevated A1c  Patient's not interested in any other agents than the Levemir and Glucotrol at this time  Patient declined to see clinical pharmacy  Plan is to refill Levemir and Glucotrol at current dose level and will obtain for patient new glucometer and testing supplies

## 2021-09-03 NOTE — Assessment & Plan Note (Signed)
Continue vitamin D weekly.  

## 2021-09-03 NOTE — Assessment & Plan Note (Signed)
Follow-up metabolic panel 

## 2021-09-03 NOTE — Assessment & Plan Note (Signed)
Hypertension plus minus control part of this due to not taking current medications  Will monitor for now

## 2021-09-03 NOTE — Assessment & Plan Note (Signed)
Right hip pain likely due to lumbar disc disease  Trial of meloxicam 15 mg daily

## 2021-09-03 NOTE — Assessment & Plan Note (Signed)
This has resolved with treatment of cervical spine

## 2021-09-03 NOTE — Assessment & Plan Note (Signed)
Does not engage around smoking cessation

## 2021-09-03 NOTE — Progress Notes (Signed)
Established Patient Office Visit  Subjective:  Patient ID: Ashley [name], female    DOB: Feb 17, 1961  Age: 60 y.o. MRN: [id number]  CC:  Chief Complaint  Patient presents with   Diabetes   Hypertension    HPI Ashley [name] presents for primary care follow-up.  Patient with history of type 2 diabetes bipolar disorder hyperlipidemia  The patient is coming up on the first anniversary of the death of her daughter at age 62 of lupus.  She has been very depressed and disengaged around her health.  She had not been taking her insulin product or Glucotrol.  On arrival blood sugar was 435 and A1c was 10.6.  She does complain of numbness in the fingers and right hip pain.  She declines to receive any vaccinations at this time.  She does have a colon cancer screening she states she will consider following through on this.  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 The patient appears to recover from that surgery.  She states the numbness in the fingers were not occurring prior to the cervical surgery.  Patient does not have any other complaints at this time.  Her PHQ 9 and GAD-7 are elevated but she is not suicidal.  She still smokes a pack a day of cigarettes and is not engaged around quitting smoking at this time.  Also she is taking Seroquel on a regular basis but declines to see a provider for her mental health.      Past Medical History:  Diagnosis Date   Anxiety    Arthritis    Bipolar 1 disorder ([place])    Cervical myelopathy ([place]) 12/28/2020   Cervical spinal cord compression (HCC) 12/19/2020   Depression    Diabetes mellitus without complication (HCC)    Type II   GERD (gastroesophageal reflux disease)    Heart murmur    "nothing to worry about"   HNP (herniated nucleus pulposus) with myelopathy,  cervical    HTN (hypertension)    not on medication   Quadriplegia and quadriparesis ([place])    Stroke ([place]) 11/2020    Past Surgical History:  Procedure Laterality Date   ABDOMINAL HYSTERECTOMY     ANTERIOR CERVICAL DECOMP/DISCECTOMY FUSION N/A 12/25/2020   Procedure: CERVICAL THREE-FOUR ANTERIOR CERVICAL DECOMPRESSION/DISCECTOMY FUSION;  Surgeon: Judith Part, MD;  Location: [facility];  Service: Neurosurgery;  Laterality: N/A;  CERVICAL THREE-FOUR ANTERIOR CERVICAL DECOMPRESSION/DISCECTOMY FUSION    LUMBAR LAMINECTOMY/DECOMPRESSION MICRODISCECTOMY Left 04/07/2016   Procedure: Left Lumbar four-five Microdiskectomy;  Surgeon: Erline Levine, MD;  Location: [facility];  Service: Neurosurgery;  Laterality: Left;    Family History  Problem Relation Age of Onset   Healthy Mother    Diabetes Mellitus II Father    Breast cancer Sister    Diabetes Brother    Heart disease Neg Hx    Stroke Neg Hx    Kidney disease Neg Hx     Social History   Socioeconomic History   Marital status: Divorced    Spouse name: Not on file   Number of children: 3   Years of education: Not on file   Highest education level: High school graduate  Occupational History   Not on file  Tobacco Use   Smoking status: Every Day    Packs/day: 0.25  Years: 39.00    Pack years: 9.75    Types: Cigarettes   Smokeless tobacco: Never  Vaping Use   Vaping Use: Never used  Substance and Sexual Activity   Alcohol use: No   Drug use: Yes    Types: Marijuana    Comment: Cocaine -- 04/06/16- last time   Sexual activity: Yes    Birth control/protection: Surgical  Other Topics Concern   Not on file  Social History Narrative   Lives with daughter   Right Handed   Drinks 1 cup of coffee per day      Social Determinants of Health   Financial Resource Strain: Not on file  Food Insecurity: Not on file  Transportation Needs: Not on file  Physical Activity: Not on file  Stress: Not on file  Social Connections:  Not on file  Intimate Partner Violence: Not on file    Outpatient Medications Prior to Visit  Medication Sig Dispense Refill   acetaminophen (TYLENOL) 325 MG tablet Take 1-2 tablets (325-650 mg total) by mouth every 4 (four) hours as needed for mild pain. 60 tablet 1   ascorbic acid (VITAMIN C) 500 MG tablet Take 1 tablet (500 mg total) by mouth daily.     Multiple Vitamin (MULTIVITAMIN WITH MINERALS) TABS tablet Take 1 tablet by mouth daily.     Insulin Pen Needle (TRUEPLUS PEN NEEDLES) 32G X 4 MM MISC Use to inject insulin. 100 each 11   loratadine (CLARITIN) 10 MG tablet Take 1 tablet (10 mg total) by mouth daily.     QUEtiapine (SEROQUEL) 400 MG tablet Take 1 tablet (400 mg total) by mouth 2 (two) times daily. 60 tablet 1   aspirin EC 81 MG tablet Take 1 tablet (81 mg total) by mouth daily. Swallow whole. (Patient not taking: Reported on 09/03/2021) 30 tablet 11   clopidogrel (PLAVIX) 75 MG tablet Take 1 tablet (75 mg total) by mouth daily. Start taking only on 3/29 (Patient not taking: Reported on 09/03/2021) 90 tablet 1   glipiZIDE (GLUCOTROL XL) 10 MG 24 hr tablet Take 2 tablets (20 mg total) by mouth daily. (Patient not taking: Reported on 09/03/2021) 60 tablet 3   insulin detemir (LEVEMIR) 100 UNIT/ML FlexPen INJECT 40 UNITS IN THE MORNING AND 10 UNITS AT BEDTIME. (Patient not taking: Reported on 09/03/2021) 15 mL 2   rosuvastatin (CRESTOR) 40 MG tablet Take 1 tablet (40 mg total) by mouth daily. (Patient not taking: Reported on 09/03/2021) 90 tablet 1   No facility-administered medications prior to visit.    No Known Allergies  ROS Review of Systems  Constitutional:  Negative for chills, diaphoresis and fever.  HENT:  Negative for congestion, hearing loss, nosebleeds, sore throat and tinnitus.   Eyes:  Negative for photophobia and redness.  Respiratory:  Negative for cough, shortness of breath, wheezing and stridor.   Cardiovascular:  Negative for chest pain, palpitations and  leg swelling.  Gastrointestinal:  Negative for abdominal pain, blood in stool, constipation, diarrhea, nausea and vomiting.  Endocrine: Negative for polydipsia.  Genitourinary:  Negative for dysuria, flank pain, frequency, hematuria and urgency.  Musculoskeletal:  Negative for back pain, myalgias and neck pain.  Skin:  Negative for rash.  Allergic/Immunologic: Negative for environmental allergies.  Neurological:  Positive for numbness. Negative for dizziness, tremors, seizures, weakness and headaches.  Hematological:  Does not bruise/bleed easily.  Psychiatric/Behavioral:  Negative for suicidal ideas. The patient is not nervous/anxious.      Objective:    Physical  Exam Vitals reviewed.  Constitutional:      Appearance: Normal appearance. She is well-developed and normal weight. She is not diaphoretic.  HENT:     Head: Normocephalic and atraumatic.     Nose: No nasal deformity, septal deviation, mucosal edema or rhinorrhea.     Right Sinus: No maxillary sinus tenderness or frontal sinus tenderness.     Left Sinus: No maxillary sinus tenderness or frontal sinus tenderness.     Mouth/Throat:     Pharynx: No oropharyngeal exudate.  Eyes:     General: No scleral icterus.    Conjunctiva/sclera: Conjunctivae normal.     Pupils: Pupils are equal, round, and reactive to light.  Neck:     Thyroid: No thyromegaly.     Vascular: No carotid bruit or JVD.     Trachea: Trachea normal. No tracheal tenderness or tracheal deviation.  Cardiovascular:     Rate and Rhythm: Normal rate and regular rhythm.     Chest Wall: PMI is not displaced.     Pulses: Normal pulses. No decreased pulses.     Heart sounds: Normal heart sounds, S1 normal and S2 normal. Heart sounds not distant. No murmur heard. No systolic murmur is present.  No diastolic murmur is present.    No friction rub. No gallop. No S3 or S4 sounds.  Pulmonary:     Effort: No tachypnea, accessory muscle usage or respiratory distress.      Breath sounds: No stridor. No decreased breath sounds, wheezing, rhonchi or rales.  Chest:     Chest wall: No tenderness.  Abdominal:     General: Bowel sounds are normal. There is no distension.     Palpations: Abdomen is soft. Abdomen is not rigid.     Tenderness: There is no abdominal tenderness. There is no guarding or rebound.  Musculoskeletal:        General: Normal range of motion.     Cervical back: Normal range of motion and neck supple. No edema, erythema or rigidity. No muscular tenderness. Normal range of motion.  Lymphadenopathy:     Head:     Right side of head: No submental or submandibular adenopathy.     Left side of head: No submental or submandibular adenopathy.     Cervical: No cervical adenopathy.  Skin:    General: Skin is warm and dry.     Coloration: Skin is not pale.     Findings: No rash.     Nails: There is no clubbing.  Neurological:     Mental Status: She is alert and oriented to person, place, and time.     Sensory: No sensory deficit.  Psychiatric:        Attention and Perception: Attention normal.        Mood and Affect: Mood is depressed.        Speech: Speech normal.        Behavior: Behavior normal. Behavior is cooperative.        Thought Content: Thought content does not include homicidal or suicidal ideation.        Cognition and Memory: Cognition and memory normal.        Judgment: Judgment normal.    BP (!) 144/88   Resp 16   Wt 144 lb (65.3 kg)   BMI 22.55 kg/m  Wt Readings from Last 3 Encounters:  09/03/21 144 lb (65.3 kg)  12/28/20 143 lb 8.3 oz (65.1 kg)  12/20/20 143 lb 8.3 oz (65.1 kg)  Health Maintenance Due  Topic Date Due   COLON CANCER SCREENING ANNUAL FOBT  05/20/2014   OPHTHALMOLOGY EXAM  11/30/2020   URINE MICROALBUMIN  05/10/2021    There are no preventive care reminders to display for this patient.  Lab Results  Component Value Date   TSH 0.970 11/12/2020   Lab Results  Component Value Date   WBC 10.3  04/11/2021   HGB 13.6 04/11/2021   HCT 43.2 04/11/2021   MCV 92 04/11/2021   PLT 449 04/11/2021   Lab Results  Component Value Date   NA 141 04/19/2021   K 3.9 04/19/2021   CO2 27 12/31/2020   GLUCOSE 159 (H) 04/19/2021   BUN 18 04/19/2021   CREATININE 1.23 (H) 04/19/2021   BILITOT <0.2 04/19/2021   ALKPHOS 164 (H) 04/19/2021   AST 42 (H) 04/19/2021   ALT 60 (H) 12/29/2020   PROT 7.3 04/19/2021   ALBUMIN 4.6 04/19/2021   CALCIUM 10.0 04/19/2021   ANIONGAP 7 12/31/2020   EGFR 50 (L) 04/19/2021   Lab Results  Component Value Date   CHOL 314 (H) 11/12/2020   Lab Results  Component Value Date   HDL 42 11/12/2020   Lab Results  Component Value Date   LDLCALC 194 (H) 11/12/2020   Lab Results  Component Value Date   TRIG 386 (H) 11/12/2020   Lab Results  Component Value Date   CHOLHDL 7.5 (H) 11/12/2020   Lab Results  Component Value Date   HGBA1C 10.6 (A) 09/03/2021      Assessment & Plan:   Problem List Items Addressed This Visit       Cardiovascular and Mediastinum   Essential hypertension    Hypertension plus minus control part of this due to not taking current medications  Will monitor for now      Relevant Medications   rosuvastatin (CRESTOR) 40 MG tablet     Endocrine   Uncontrolled type 2 diabetes mellitus with hyperglycemia (HCC)    Uncontrolled type 2 diabetes with elevated A1c  Patient's not interested in any other agents than the Levemir and Glucotrol at this time  Patient declined to see clinical pharmacy  Plan is to refill Levemir and Glucotrol at current dose level and will obtain for patient new glucometer and testing supplies      Relevant Medications   glipiZIDE (GLUCOTROL XL) 10 MG 24 hr tablet   Insulin Pen Needle (TECHLITE PEN NEEDLES) 32G X 4 MM MISC   insulin detemir (LEVEMIR) 100 UNIT/ML FlexPen   rosuvastatin (CRESTOR) 40 MG tablet     Nervous and Auditory   RESOLVED: Quadriplegia and quadriparesis (HCC)    This has  resolved with treatment of cervical spine        Musculoskeletal and Integument   Lumbar degenerative disc disease    Right hip pain likely due to lumbar disc disease  Trial of meloxicam 15 mg daily      Relevant Medications   meloxicam (MOBIC) 15 MG tablet     Genitourinary   Renal insufficiency    Follow-up metabolic panel        Other   Vaccine refused by patient    Refuses all vaccinations      Mixed dyslipidemia   Relevant Medications   rosuvastatin (CRESTOR) 40 MG tablet   Tobacco user    Does not engage around smoking cessation      Vitamin D deficiency    Continue vitamin D weekly  Bipolar affective disorder, current episode depressed ([place])    Continue Seroquel declines to see mental health provider  No evidence of schizophrenia      H/O cervical spine surgery   Other Visit Diagnoses     Type 2 diabetes mellitus with hyperglycemia, with long-term current use of insulin (HCC)    -  Primary   Relevant Medications   glipiZIDE (GLUCOTROL XL) 10 MG 24 hr tablet   Insulin Pen Needle (TECHLITE PEN NEEDLES) 32G X 4 MM MISC   insulin detemir (LEVEMIR) 100 UNIT/ML FlexPen   rosuvastatin (CRESTOR) 40 MG tablet   Other Relevant Orders   Microalbumin / creatinine urine ratio   POCT glucose (manual entry) (Completed)   POCT glycosylated hemoglobin (Hb A1C) (Completed)   Colon cancer screening       Relevant Orders   Fecal occult blood, imunochemical   Undifferentiated schizophrenia (HCC)       Relevant Medications   QUEtiapine (SEROQUEL) 400 MG tablet       Meds ordered this encounter  Medications   glipiZIDE (GLUCOTROL XL) 10 MG 24 hr tablet    Sig: Take 2 tablets (20 mg total) by mouth daily.    Dispense:  60 tablet    Refill:  4   Insulin Pen Needle (TECHLITE PEN NEEDLES) 32G X 4 MM MISC    Sig: take as directed    Dispense:  100 each    Refill:  11   insulin detemir (LEVEMIR) 100 UNIT/ML FlexPen    Sig: INJECT 40 UNITS IN THE MORNING AND  10 UNITS AT BEDTIME.    Dispense:  15 mL    Refill:  2   QUEtiapine (SEROQUEL) 400 MG tablet    Sig: Take 1 tablet (400 mg total) by mouth 2 (two) times daily.    Dispense:  60 tablet    Refill:  1   rosuvastatin (CRESTOR) 40 MG tablet    Sig: Take 1 tablet (40 mg total) by mouth daily.    Dispense:  90 tablet    Refill:  1   Blood Glucose Monitoring Suppl (TRUE METRIX METER) w/Device KIT    Sig: Use to measure blood sugar twice a day    Dispense:  1 kit    Refill:  0   TRUEplus Lancets 28G MISC    Sig: Use to measure blood sugar twice a day    Dispense:  100 each    Refill:  1   glucose blood (TRUE METRIX BLOOD GLUCOSE TEST) test strip    Sig: Use as instructed    Dispense:  100 each    Refill:  12   meloxicam (MOBIC) 15 MG tablet    Sig: Take 1 tablet (15 mg total) by mouth daily.    Dispense:  30 tablet    Refill:  1  38 minutes spent going over history attempting to engage patient around her health measures complex medical decision making is high  Follow-up: Return in about 2 months (around 11/03/2021).    Asencion Noble, MD

## 2021-09-03 NOTE — Assessment & Plan Note (Signed)
Continue Seroquel declines to see mental health provider  No evidence of schizophrenia

## 2021-09-03 NOTE — Assessment & Plan Note (Signed)
Refuses all vaccinations. 

## 2021-09-04 LAB — MICROALBUMIN / CREATININE URINE RATIO
Creatinine, Urine: 57.1 mg/dL
Microalb/Creat Ratio: 21 mg/g creat (ref 0–29)
Microalbumin, Urine: 12 ug/mL

## 2021-09-05 ENCOUNTER — Other Ambulatory Visit: Payer: Self-pay

## 2021-09-09 ENCOUNTER — Other Ambulatory Visit: Payer: Self-pay

## 2021-09-10 ENCOUNTER — Other Ambulatory Visit: Payer: Self-pay

## 2021-09-11 ENCOUNTER — Telehealth: Payer: Self-pay

## 2021-09-11 LAB — FECAL OCCULT BLOOD, IMMUNOCHEMICAL: Fecal Occult Bld: NEGATIVE

## 2021-09-11 NOTE — Telephone Encounter (Signed)
Pt was called and vm was left, Information has been sent to nurse pool.   

## 2021-09-11 NOTE — Telephone Encounter (Signed)
-----   Message from Elsie Stain, MD sent at 09/11/2021 12:08 PM EST ----- Let pt know colon cancer screen negative

## 2021-12-06 ENCOUNTER — Other Ambulatory Visit: Payer: Self-pay

## 2021-12-06 ENCOUNTER — Telehealth: Payer: Self-pay | Admitting: Critical Care Medicine

## 2021-12-06 ENCOUNTER — Ambulatory Visit: Payer: Self-pay

## 2021-12-06 DIAGNOSIS — F203 Undifferentiated schizophrenia: Secondary | ICD-10-CM

## 2021-12-06 NOTE — Telephone Encounter (Signed)
Requested medication (s) are due for refill today: yes ? ?Requested medication (s) are on the active medication list: yes ? ?Last refill:  09/03/21 #60/1 ? ?Future visit scheduled: No ? ?Notes to clinic:  Unable to refill per protocol, cannot delegate. ? ? ? ?  ?Requested Prescriptions  ?Pending Prescriptions Disp Refills  ? QUEtiapine (SEROQUEL) 400 MG tablet 60 tablet 1  ?  Sig: Take 1 tablet (400 mg total) by mouth 2 (two) times daily.  ?  ? Not Delegated - Psychiatry:  Antipsychotics - Second Generation (Atypical) - quetiapine Failed - 12/06/2021  4:23 PM  ?  ?  Failed - This refill cannot be delegated  ?  ?  Failed - TSH in normal range and within 360 days  ?  TSH  ?Date Value Ref Range Status  ?11/12/2020 0.970 0.450 - 4.500 uIU/mL Final  ?  ?  ?  ?  Failed - Last BP in normal range  ?  BP Readings from Last 1 Encounters:  ?09/03/21 (!) 144/88  ?  ?  ?  ?  Failed - Lipid Panel in normal range within the last 12 months  ?  Cholesterol, Total  ?Date Value Ref Range Status  ?11/12/2020 314 (H) 100 - 199 mg/dL Final  ? ?LDL Chol Calc (NIH)  ?Date Value Ref Range Status  ?11/12/2020 194 (H) 0 - 99 mg/dL Final  ? ?HDL  ?Date Value Ref Range Status  ?11/12/2020 42 >39 mg/dL Final  ? ?Triglycerides  ?Date Value Ref Range Status  ?11/12/2020 386 (H) 0 - 149 mg/dL Final  ? ?  ?  ?  Passed - Completed PHQ-2 or PHQ-9 in the last 360 days  ?  ?  Passed - Last Heart Rate in normal range  ?  Pulse Readings from Last 1 Encounters:  ?01/03/21 84  ?  ?  ?  ?  Passed - Valid encounter within last 6 months  ?  Recent Outpatient Visits   ? ?      ? 3 months ago Type 2 diabetes mellitus with hyperglycemia, with long-term current use of insulin ([place])  ? [place] Elsie Stain, MD  ? 6 months ago History of stroke  ? [place], [place], [place]  ? 11 months ago Cerebrovascular accident (CVA), unspecified mechanism ([place])  ? [place] Elsie Stain, MD  ? 1 year ago Type 2 diabetes mellitus with hyperglycemia, with long-term current use of insulin ([place])  ? [place] Elsie Stain, MD  ? 1 year ago Uncontrolled type 2 diabetes mellitus with hyperglycemia ([place])  ? [place] Elsie Stain, MD  ? ?  ?  ? ?  ?  ?  Passed - CBC within normal limits and completed in the last 12 months  ?  WBC  ?Date Value Ref Range Status  ?04/11/2021 10.3 3.4 - 10.8 x10E3/uL Final  ?12/31/2020 9.5 4.0 - 10.5 K/uL Final  ? ?RBC  ?Date Value Ref Range Status  ?04/11/2021 4.70 3.77 - 5.28 x10E6/uL Final  ?12/31/2020 3.69 (L) 3.87 - 5.11 MIL/uL Final  ? ?Hemoglobin  ?Date Value Ref Range Status  ?04/11/2021 13.6 11.1 - 15.9 g/dL Final  ? ?Hematocrit  ?Date Value Ref Range Status  ?04/11/2021 43.2 34.0 - 46.6 % Final  ? ?MCHC  ?Date Value Ref Range Status  ?04/11/2021 31.5  31.5 - 35.7 g/dL Final  ?12/31/2020 32.6 30.0 - 36.0 g/dL Final  ? ?MCH  ?Date Value Ref Range Status  ?04/11/2021 28.9 26.6 - 33.0 pg Final  ?12/31/2020 30.4 26.0 - 34.0 pg Final  ? ?MCV  ?Date Value Ref Range Status  ?04/11/2021 92 79 - 97 fL Final  ? ?No results found for: PLTCOUNTKUC, LABPLAT, [place] ?RDW  ?Date Value Ref Range Status  ?04/11/2021 15.6 (H) 11.7 - 15.4 % Final  ? ?  ?  ?  Passed - CMP within normal limits and completed in the last 12 months  ?  Albumin  ?Date Value Ref Range Status  ?04/19/2021 4.6 3.8 - 4.8 g/dL Final  ? ?Alkaline Phosphatase  ?Date Value Ref Range Status  ?04/19/2021 164 (H) 44 - 121 IU/L Final  ? ?ALT  ?Date Value Ref Range Status  ?12/29/2020 60 (H) 0 - 44 U/L Final  ? ?AST  ?Date Value Ref Range Status  ?04/19/2021 42 (H) 0 - 40 IU/L Final  ? ?BUN  ?Date Value Ref Range Status  ?04/19/2021 18 8 - 27 mg/dL Final  ? ?Calcium  ?Date Value Ref Range Status  ?04/19/2021 10.0 8.7 - 10.3 mg/dL Final  ? ?CO2  ?Date Value Ref Range Status  ?12/31/2020 27 22 - 32 mmol/L Final   ? ?Bicarbonate  ?Date Value Ref Range Status  ?05/18/2017 20.4 20.0 - 28.0 mmol/L Final  ? ?TCO2  ?Date Value Ref Range Status  ?05/17/2017 22 0 - 100 mmol/L Final  ? ?Creat  ?Date Value Ref Range Status  ?08/04/2016 0.88 0.50 - 1.05 mg/dL Final  ?  Comment:  ?    ?For patients > or = 64 years of age: The upper reference limit for ?Creatinine is approximately 13% higher for people identified as ?African-American. ?  ?  ? ?Creatinine, Ser  ?Date Value Ref Range Status  ?04/19/2021 1.23 (H) 0.57 - 1.00 mg/dL Final  ? ?Creatinine, Urine  ?Date Value Ref Range Status  ?07/02/2016 CANCELED 20 - 320 mg/dL   ?  Comment:  ?  Test not performed, no urine was received. ?  ? ?Result canceled by the ancillary ?  ? ?Glucose  ?Date Value Ref Range Status  ?04/19/2021 159 (H) 65 - 99 mg/dL Final  ? ?Glucose, Bld  ?Date Value Ref Range Status  ?12/31/2020 119 (H) 70 - 99 mg/dL Final  ?  Comment:  ?  Glucose reference range applies only to samples taken after fasting for at least 8 hours.  ? ?POC Glucose  ?Date Value Ref Range Status  ?09/03/2021 435 (A) 70 - 99 mg/dl Final  ? ?Glucose-Capillary  ?Date Value Ref Range Status  ?01/03/2021 113 (H) 70 - 99 mg/dL Final  ?  Comment:  ?  Glucose reference range applies only to samples taken after fasting for at least 8 hours.  ? ?Potassium  ?Date Value Ref Range Status  ?04/19/2021 3.9 3.5 - 5.2 mmol/L Final  ? ?Sodium  ?Date Value Ref Range Status  ?04/19/2021 141 134 - 144 mmol/L Final  ? ?Bilirubin Total  ?Date Value Ref Range Status  ?04/19/2021 <0.2 0.0 - 1.2 mg/dL Final  ? ?Bilirubin, Direct  ?Date Value Ref Range Status  ?05/17/2017 <0.1 (L) 0.1 - 0.5 mg/dL Final  ? ?Indirect Bilirubin  ?Date Value Ref Range Status  ?05/17/2017 NOT CALCULATED 0.3 - 0.9 mg/dL Final  ? ?Protein, ur  ?Date Value Ref Range Status  ?12/30/2020 NEGATIVE NEGATIVE mg/dL Final  ? ?Total  Protein  ?Date Value Ref Range Status  ?04/19/2021 7.3 6.0 - 8.5 g/dL Final  ? ?GFR calc Af Amer  ?Date Value Ref Range  Status  ?11/12/2020 72 >59 mL/min/1.73 Final  ?  Comment:  ?  **In accordance with recommendations from the NKF-ASN Task force,** ?  [facility] is in the process of updating its eGFR calculation to the ?  2021 CKD-EPI creatinine equation that estimates kidney function ?  without a race variable. ?  ? ?eGFR  ?Date Value Ref Range Status  ?04/19/2021 50 (L) >59 mL/min/1.73 Final  ? ?GFR, Estimated  ?Date Value Ref Range Status  ?12/31/2020 >60 >60 mL/min Final  ?  Comment:  ?  (NOTE) ?Calculated using the CKD-EPI Creatinine Equation (2021) ?  ? ?  ?  ?  ? ?

## 2021-12-06 NOTE — Telephone Encounter (Signed)
?  Chief Complaint: med refill  ?Symptoms: feeling very weak, needing sleep, and just upset ?Frequency: 2 days ?Pertinent Negatives: Patient denies SI or HI, denies anything new causing increased stress ?Disposition: [] ED /[] Urgent Care (no appt availability in office) / [] Appointment(In office/virtual)/ []  [facility]/ [x] Home Care/ [] Refused Recommended Disposition /[]  [facility]/ []  Follow-up with PCP ?Additional Notes:  ?Pt is just wanting refill on Seroquel. She stats she has been out of it for 2 days now. She denies anything occurring that has caused her to feel this way just needing the medication. I attempted to refill but this medication is not delegated so advised pt I will send to [name] [facility] and see if we can get it refilled today. Pt is wanting [facility] medication [facility] to [facility].  ? ? ?Reason for Disposition ? Depression medicines, questions about ? ?Answer Assessment - Initial Assessment Questions ?1. CONCERN: "What happened that made you call today?" ?    Needing medication refilled  ?2. DEPRESSION SYMPTOM SCREENING: "How are you feeling overall?" (e.g., decreased energy, increased sleeping or difficulty sleeping, difficulty concentrating, feelings of sadness, guilt, hopelessness, or worthlessness) ?    Feeling very weak and upset  ?3. RISK OF HARM - SUICIDAL IDEATION:  "Do you ever have thoughts of hurting or killing yourself?"  (e.g., yes, no, no but preoccupation with thoughts about death) ?  - INTENT:  "Do you have thoughts of hurting or killing yourself right NOW?" (e.g., yes, no, N/A) ?  - PLAN: "Do you have a specific plan for how you would do this?" (e.g., gun, knife, overdose, no plan, N/A) ?    No ?4. RISK OF HARM - HOMICIDAL IDEATION:  "Do you ever have thoughts of hurting or killing someone else?"  (e.g., yes, no, no but preoccupation with thoughts about death) ?  - INTENT:  "Do you have thoughts of hurting or killing someone right NOW?" (e.g., yes, no, N/A) ?   - PLAN: "Do you have a specific plan for how you would do this?" (e.g., gun, knife, no plan, N/A)  ?    No ?8. STRESSORS: "Has there been any new stress or recent changes in your life?" ?    No just out of medicine  ?9. ALCOHOL USE OR SUBSTANCE USE (DRUG USE): "Do you drink alcohol or use any illegal drugs?" ?    No ?10. OTHER: "Do you have any other physical symptoms right now?" (e.g., fever) ?      Needing sleep ? ?Protocols used: [place] ? ?

## 2021-12-08 MED ORDER — QUETIAPINE FUMARATE 400 MG PO TABS: 400.0000 mg | ORAL_TABLET | Freq: Two times a day (BID) | ORAL | 1 refills | Status: DC

## 2021-12-08 NOTE — Addendum Note (Signed)
Addended by: Elsie Stain on: 12/08/2021 08:15 AM ? ? Modules accepted: Orders ? ?

## 2021-12-08 NOTE — Telephone Encounter (Signed)
Refill sent , pt needs appt with me soon ?

## 2021-12-09 ENCOUNTER — Encounter: Payer: Self-pay | Admitting: Critical Care Medicine

## 2021-12-09 NOTE — Telephone Encounter (Signed)
This was sent 12/08/21 to [facility] ?

## 2021-12-09 NOTE — Telephone Encounter (Signed)
Pt called, left VM that Seroquel was sent in to [facility] by [name]. Advised pt if she has any questions to give office a CB.  ?

## 2021-12-09 NOTE — Telephone Encounter (Signed)
Called pt and she is aware of meds and has an appointment already made  ?

## 2022-01-07 ENCOUNTER — Other Ambulatory Visit: Payer: Self-pay | Admitting: Physician Assistant

## 2022-01-07 DIAGNOSIS — Z1231 Encounter for screening mammogram for malignant neoplasm of breast: Secondary | ICD-10-CM

## 2022-01-28 ENCOUNTER — Other Ambulatory Visit: Payer: Self-pay | Admitting: Critical Care Medicine

## 2022-01-28 DIAGNOSIS — F203 Undifferentiated schizophrenia: Secondary | ICD-10-CM

## 2022-01-28 NOTE — Progress Notes (Signed)
? ?Established Patient Office Visit ? ?Subjective:  ?Patient ID: Ashley [name], female    DOB: [birth year]  Age: 61 y.o. MRN: [id number] ? ?CC:  ?Chief Complaint  ?Patient presents with  ? Medication Refill  ? Diabetes  ? ? ?HPI ?08/2022 ?[name] presents for primary care follow-up.  Patient with history of type 2 diabetes bipolar disorder hyperlipidemia  ?The patient is coming up on the first anniversary of the death of her daughter at age 7 of lupus.  She has been very depressed and disengaged around her health.  She had not been taking her insulin product or Glucotrol.  On arrival blood sugar was 435 and A1c was 10.6.  She does complain of numbness in the fingers and right hip pain.  She declines to receive any vaccinations at this time.  She does have a colon cancer screening she states she will consider following through on this.  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 ?The patient appears to recover from that surgery.  She states the numbness in the fingers were not occurring prior to the cervical surgery. ? ?Patient does not have any other complaints at this time.  Her PHQ 9 and GAD-7 are elevated but she is not suicidal.  She still smokes a pack a day of cigarettes and is not engaged around quitting smoking at this time.  Also she is taking Seroquel on a regular basis but declines to see a provider for her mental health. ? ?01/29/22 ?Patient returns in follow-up for hypertension on arrival blood pressure 120/80.  Blood sugar 194 A1c 9.8 down point from 10.6.  Not able to buy healthy food does not have Medicaid. ? ?Patient needs to schedule her mammogram.  Has no other complaints at this visit.  She wants to give plasma. ?Continue glipizide ? ? ?Past Medical History:  ?Diagnosis Date  ? Anxiety   ? Arthritis   ?  Bipolar 1 disorder ([place])   ? Cervical myelopathy ([place]) 12/28/2020  ? Cervical spinal cord compression ([place]) 12/19/2020  ? Depression   ? Diabetes mellitus without complication ([place])   ? Type II  ? GERD (gastroesophageal reflux disease)   ? Heart murmur   ? "nothing to worry about"  ? HNP (herniated nucleus pulposus) with myelopathy, cervical   ? HTN (hypertension)   ? not on medication  ? Quadriplegia and quadriparesis ([place])   ? Stroke [facility]) 11/2020  ? ? ?Past Surgical History:  ?Procedure Laterality Date  ? ABDOMINAL HYSTERECTOMY    ? ANTERIOR CERVICAL DECOMP/DISCECTOMY FUSION N/A 12/25/2020  ? Procedure: CERVICAL THREE-FOUR ANTERIOR CERVICAL DECOMPRESSION/DISCECTOMY FUSION;  Surgeon: Judith Part, MD;  Location: [facility];  Service: Neurosurgery;  Laterality: N/A;  CERVICAL THREE-FOUR ANTERIOR CERVICAL DECOMPRESSION/DISCECTOMY FUSION   ? LUMBAR LAMINECTOMY/DECOMPRESSION MICRODISCECTOMY Left 04/07/2016  ? Procedure: Left Lumbar four-five Microdiskectomy;  Surgeon: Erline Levine, MD;  Location: [facility];  Service: Neurosurgery;  Laterality: Left;  ? ? ?Family History  ?Problem Relation Age of Onset  ? Healthy Mother   ? Diabetes Mellitus II Father   ? Breast cancer Sister   ? Diabetes Brother   ? Heart disease Neg Hx   ? Stroke Neg Hx   ? Kidney disease Neg Hx   ? ? ?Social History  ? ?Socioeconomic History  ? Marital status:  Divorced  ?  Spouse name: Not on file  ? Number of children: 3  ? Years of education: Not on file  ? Highest education level: High school graduate  ?Occupational History  ? Not on file  ?Tobacco Use  ? Smoking status: Every Day  ?  Packs/day: 0.25  ?  Years: 39.00  ?  Pack years: 9.75  ?  Types: Cigarettes  ? Smokeless tobacco: Never  ?Vaping Use  ? Vaping Use: Never used  ?Substance and Sexual Activity  ? Alcohol use: No  ? Drug use: Yes  ?  Types: Marijuana  ?  Comment: Cocaine -- 04/06/16- last time  ? Sexual activity: Yes  ?  Birth control/protection: Surgical  ?Other Topics Concern  ? Not  on file  ?Social History Narrative  ? Lives with daughter  ? Right Handed  ? Drinks 1 cup of coffee per day  ?   ? ?Social Determinants of Health  ? ?Financial Resource Strain: Not on file  ?Food Insecurity: Not on file  ?Transportation Needs: Not on file  ?Physical Activity: Not on file  ?Stress: Not on file  ?Social Connections: Not on file  ?Intimate Partner Violence: Not on file  ? ? ?Outpatient Medications Prior to Visit  ?Medication Sig Dispense Refill  ? acetaminophen (TYLENOL) 325 MG tablet Take 1-2 tablets (325-650 mg total) by mouth every 4 (four) hours as needed for mild pain. 60 tablet 1  ? ascorbic acid (VITAMIN C) 500 MG tablet Take 1 tablet (500 mg total) by mouth daily.    ? Blood Glucose Monitoring Suppl (TRUE METRIX METER) w/Device KIT Use to measure blood sugar twice a day 1 kit 0  ? glucose blood (TRUE METRIX BLOOD GLUCOSE TEST) test strip Use as instructed 100 each 12  ? meloxicam (MOBIC) 15 MG tablet Take 1 tablet (15 mg total) by mouth daily. 30 tablet 1  ? Multiple Vitamin (MULTIVITAMIN WITH MINERALS) TABS tablet Take 1 tablet by mouth daily.    ? TRUEplus Lancets 28G MISC Use to measure blood sugar twice a day 100 each 1  ? glipiZIDE (GLUCOTROL XL) 10 MG 24 hr tablet Take 2 tablets (20 mg total) by mouth daily. 60 tablet 4  ? insulin detemir (LEVEMIR) 100 UNIT/ML FlexPen INJECT 40 UNITS IN THE MORNING AND 10 UNITS AT BEDTIME. 15 mL 2  ? Insulin Pen Needle (TECHLITE PEN NEEDLES) 32G X 4 MM MISC take as directed 100 each 11  ? QUEtiapine (SEROQUEL) 400 MG tablet Take 1 tablet (400 mg total) by mouth 2 (two) times daily. 60 tablet 1  ? rosuvastatin (CRESTOR) 40 MG tablet Take 1 tablet (40 mg total) by mouth daily. 90 tablet 1  ? tiZANidine (ZANAFLEX) 4 MG tablet SMARTSIG:2 Tablet(s) By Mouth 1-3 Times Daily PRN (Patient not taking: Reported on 01/29/2022)    ? ?No facility-administered medications prior to visit.  ? ? ?No Known Allergies ? ?ROS ?Review of Systems  ?Constitutional:  Negative for  chills, diaphoresis and fever.  ?HENT:  Negative for congestion, hearing loss, nosebleeds, sore throat and tinnitus.   ?Eyes:  Negative for photophobia and redness.  ?Respiratory:  Negative for cough, shortness of breath, wheezing and stridor.   ?Cardiovascular:  Negative for chest pain, palpitations and leg swelling.  ?Gastrointestinal:  Negative for abdominal pain, blood in stool, constipation, diarrhea, nausea and vomiting.  ?Endocrine: Negative for polydipsia.  ?Genitourinary:  Negative for dysuria, flank pain, frequency, hematuria and urgency.  ?Musculoskeletal:  Negative for back  pain, myalgias and neck pain.  ?Skin:  Negative for rash.  ?Allergic/Immunologic: Negative for environmental allergies.  ?Neurological:  Positive for numbness. Negative for dizziness, tremors, seizures, weakness and headaches.  ?Hematological:  Does not bruise/bleed easily.  ?Psychiatric/Behavioral:  Negative for suicidal ideas. The patient is not nervous/anxious.   ? ?  ?Objective:  ?  ?Physical Exam ?Vitals reviewed.  ?Constitutional:   ?   Appearance: Normal appearance. She is well-developed and normal weight. She is not diaphoretic.  ?HENT:  ?   Head: Normocephalic and atraumatic.  ?   Nose: No nasal deformity, septal deviation, mucosal edema or rhinorrhea.  ?   Right Sinus: No maxillary sinus tenderness or frontal sinus tenderness.  ?   Left Sinus: No maxillary sinus tenderness or frontal sinus tenderness.  ?   Mouth/Throat:  ?   Pharynx: No oropharyngeal exudate.  ?Eyes:  ?   General: No scleral icterus. ?   Conjunctiva/sclera: Conjunctivae normal.  ?   Pupils: Pupils are equal, round, and reactive to light.  ?Neck:  ?   Thyroid: No thyromegaly.  ?   Vascular: No carotid bruit or JVD.  ?   Trachea: Trachea normal. No tracheal tenderness or tracheal deviation.  ?Cardiovascular:  ?   Rate and Rhythm: Normal rate and regular rhythm.  ?   Chest Wall: PMI is not displaced.  ?   Pulses: Normal pulses. No decreased pulses.  ?   Heart  sounds: Normal heart sounds, S1 normal and S2 normal. Heart sounds not distant. No murmur heard. ?No systolic murmur is present.  ?No diastolic murmur is present.  ?  No friction rub. No gallop. No S3 or S4

## 2022-01-28 NOTE — Telephone Encounter (Signed)
Medication Refill - Medication: QUEtiapine (SEROQUEL) 400 MG tablet. Patient will run out soon.  ? ?Has the patient contacted their pharmacy? Yes.   ? ?(Agent: If yes, when and what did the pharmacy advise?) ? ?Preferred Pharmacy (with phone number or street name):  ?[facility] ([place]), [place] - [address] Phone:  [phone]  ?Fax:  [phone]  ?  ? ? ?Has the patient been seen for an appointment in the last year OR does the patient have an upcoming appointment? Yes.   ? ?Agent: Please be advised that RX refills may take up to 3 business days. We ask that you follow-up with your pharmacy. ?

## 2022-01-29 ENCOUNTER — Telehealth: Payer: Self-pay | Admitting: Critical Care Medicine

## 2022-01-29 ENCOUNTER — Ambulatory Visit: Payer: Self-pay | Attending: Critical Care Medicine | Admitting: Critical Care Medicine

## 2022-01-29 ENCOUNTER — Encounter: Payer: Self-pay | Admitting: Critical Care Medicine

## 2022-01-29 VITALS — BP 120/81 | HR 89 | Temp 98.1°F | Resp 16 | Ht 67.0 in | Wt 147.0 lb

## 2022-01-29 DIAGNOSIS — Z720 Tobacco use: Secondary | ICD-10-CM

## 2022-01-29 DIAGNOSIS — E782 Mixed hyperlipidemia: Secondary | ICD-10-CM

## 2022-01-29 DIAGNOSIS — Z794 Long term (current) use of insulin: Secondary | ICD-10-CM

## 2022-01-29 DIAGNOSIS — F3132 Bipolar disorder, current episode depressed, moderate: Secondary | ICD-10-CM

## 2022-01-29 DIAGNOSIS — E1165 Type 2 diabetes mellitus with hyperglycemia: Secondary | ICD-10-CM

## 2022-01-29 DIAGNOSIS — I10 Essential (primary) hypertension: Secondary | ICD-10-CM

## 2022-01-29 DIAGNOSIS — I739 Peripheral vascular disease, unspecified: Secondary | ICD-10-CM

## 2022-01-29 DIAGNOSIS — F203 Undifferentiated schizophrenia: Secondary | ICD-10-CM

## 2022-01-29 LAB — POCT GLYCOSYLATED HEMOGLOBIN (HGB A1C): HbA1c, POC (controlled diabetic range): 9.8 % — AB (ref 0.0–7.0)

## 2022-01-29 LAB — GLUCOSE, POCT (MANUAL RESULT ENTRY): POC Glucose: 194 mg/dl — AB (ref 70–99)

## 2022-01-29 MED ORDER — INSULIN DETEMIR 100 UNIT/ML FLEXPEN: PEN_INJECTOR | SUBCUTANEOUS | 2 refills | Status: DC

## 2022-01-29 MED ORDER — TIZANIDINE HCL 4 MG PO TABS: ORAL_TABLET | ORAL | 2 refills | Status: DC

## 2022-01-29 MED ORDER — GLIPIZIDE ER 10 MG PO TB24: 20.0000 mg | ORAL_TABLET | Freq: Every day | ORAL | 4 refills | Status: DC

## 2022-01-29 MED ORDER — TECHLITE PEN NEEDLES 32G X 4 MM MISC: 11 refills | Status: DC

## 2022-01-29 MED ORDER — QUETIAPINE FUMARATE 400 MG PO TABS: 400.0000 mg | ORAL_TABLET | Freq: Two times a day (BID) | ORAL | 1 refills | Status: DC

## 2022-01-29 MED ORDER — ROSUVASTATIN CALCIUM 40 MG PO TABS: 40.0000 mg | ORAL_TABLET | Freq: Every day | ORAL | 1 refills | Status: DC

## 2022-01-29 NOTE — Progress Notes (Signed)
RF meds. ?F/u DM ?

## 2022-01-29 NOTE — Assessment & Plan Note (Signed)
Blood pressure well controlled no change in medications refills given ?Check metabolic panel ?

## 2022-01-29 NOTE — Patient Instructions (Signed)
Refills on medications sent to the [facility] ? ?Increase Levemir to 40 units in the morning 20 units in the evening ? ?No other medication changes ? ?Tizanidine muscle relaxant refill was sent as well ? ?We discussed that you are not clear to be able to donate plasma now due to the elevation in your blood sugars need to work on that first ? ?I will ask my nurse case manager Opal Sidles to give you a call and discuss some potential options for healthy food source ? ? ?Please get your eyes examined during this calendar year and also make sure you call back to get your mammogram performed ? ?We discussed the importance of reducing your tobacco use see attachment ? ?Return to see [name] 2 months ? ?Also we need to get you in with [facility] our clinical pharmacist in the next 2 to 3 weeks to go over your diabetic program an appointment be made at the checkout desk ?

## 2022-01-29 NOTE — Telephone Encounter (Signed)
Requested medication (s) are due for refill today: NO ? ?Requested medication (s) are on the active medication list: yes   ? ?Last refill: 01/29/22  Receipt confirmed by pharmacy 01/29/22 at 11:40 ? ?Future visit scheduled yes 04/03/22 ? ?Notes to clinic:Cannot refuse delegated meds per protocol.  ? ?Requested Prescriptions  ?Pending Prescriptions Disp Refills  ? QUEtiapine (SEROQUEL) 400 MG tablet 60 tablet 1  ?  Sig: Take 1 tablet (400 mg total) by mouth 2 (two) times daily.  ?  ? Not Delegated - Psychiatry:  Antipsychotics - Second Generation (Atypical) - quetiapine Failed - 01/28/2022  9:41 AM  ?  ?  Failed - This refill cannot be delegated  ?  ?  Failed - TSH in normal range and within 360 days  ?  TSH  ?Date Value Ref Range Status  ?11/12/2020 0.970 0.450 - 4.500 uIU/mL Final  ?  ?  ?  ?  Failed - Last BP in normal range  ?  BP Readings from Last 1 Encounters:  ?01/29/22 120/81  ?  ?  ?  ?  Failed - Lipid Panel in normal range within the last 12 months  ?  Cholesterol, Total  ?Date Value Ref Range Status  ?11/12/2020 314 (H) 100 - 199 mg/dL Final  ? ?LDL Chol Calc (NIH)  ?Date Value Ref Range Status  ?11/12/2020 194 (H) 0 - 99 mg/dL Final  ? ?HDL  ?Date Value Ref Range Status  ?11/12/2020 42 >39 mg/dL Final  ? ?Triglycerides  ?Date Value Ref Range Status  ?11/12/2020 386 (H) 0 - 149 mg/dL Final  ? ?  ?  ?  Failed - CMP within normal limits and completed in the last 12 months  ?  Albumin  ?Date Value Ref Range Status  ?04/19/2021 4.6 3.8 - 4.8 g/dL Final  ? ?Alkaline Phosphatase  ?Date Value Ref Range Status  ?04/19/2021 164 (H) 44 - 121 IU/L Final  ? ?ALT  ?Date Value Ref Range Status  ?12/29/2020 60 (H) 0 - 44 U/L Final  ? ?AST  ?Date Value Ref Range Status  ?04/19/2021 42 (H) 0 - 40 IU/L Final  ? ?BUN  ?Date Value Ref Range Status  ?04/19/2021 18 8 - 27 mg/dL Final  ? ?Calcium  ?Date Value Ref Range Status  ?04/19/2021 10.0 8.7 - 10.3 mg/dL Final  ? ?CO2  ?Date Value Ref Range Status  ?12/31/2020 27 22 - 32  mmol/L Final  ? ?Bicarbonate  ?Date Value Ref Range Status  ?05/18/2017 20.4 20.0 - 28.0 mmol/L Final  ? ?TCO2  ?Date Value Ref Range Status  ?05/17/2017 22 0 - 100 mmol/L Final  ? ?Creat  ?Date Value Ref Range Status  ?08/04/2016 0.88 0.50 - 1.05 mg/dL Final  ?  Comment:  ?    ?For patients > or = 63 years of age: The upper reference limit for ?Creatinine is approximately 13% higher for people identified as ?African-American. ?  ?  ? ?Creatinine, Ser  ?Date Value Ref Range Status  ?04/19/2021 1.23 (H) 0.57 - 1.00 mg/dL Final  ? ?Creatinine, Urine  ?Date Value Ref Range Status  ?07/02/2016 CANCELED 20 - 320 mg/dL   ?  Comment:  ?  Test not performed, no urine was received. ?  ? ?Result canceled by the ancillary ?  ? ?Glucose  ?Date Value Ref Range Status  ?04/19/2021 159 (H) 65 - 99 mg/dL Final  ? ?Glucose, Bld  ?Date Value Ref Range Status  ?12/31/2020 119 (H) 70 -  99 mg/dL Final  ?  Comment:  ?  Glucose reference range applies only to samples taken after fasting for at least 8 hours.  ? ?POC Glucose  ?Date Value Ref Range Status  ?01/29/2022 194 (A) 70 - 99 mg/dl Final  ? ?Glucose-Capillary  ?Date Value Ref Range Status  ?01/03/2021 113 (H) 70 - 99 mg/dL Final  ?  Comment:  ?  Glucose reference range applies only to samples taken after fasting for at least 8 hours.  ? ?Potassium  ?Date Value Ref Range Status  ?04/19/2021 3.9 3.5 - 5.2 mmol/L Final  ? ?Sodium  ?Date Value Ref Range Status  ?04/19/2021 141 134 - 144 mmol/L Final  ? ?Bilirubin Total  ?Date Value Ref Range Status  ?04/19/2021 <0.2 0.0 - 1.2 mg/dL Final  ? ?Bilirubin, Direct  ?Date Value Ref Range Status  ?05/17/2017 <0.1 (L) 0.1 - 0.5 mg/dL Final  ? ?Indirect Bilirubin  ?Date Value Ref Range Status  ?05/17/2017 NOT CALCULATED 0.3 - 0.9 mg/dL Final  ? ?Protein, ur  ?Date Value Ref Range Status  ?12/30/2020 NEGATIVE NEGATIVE mg/dL Final  ? ?Total Protein  ?Date Value Ref Range Status  ?04/19/2021 7.3 6.0 - 8.5 g/dL Final  ? ?GFR calc Af Amer  ?Date  Value Ref Range Status  ?11/12/2020 72 >59 mL/min/1.73 Final  ?  Comment:  ?  **In accordance with recommendations from the NKF-ASN Task force,** ?  [facility] is in the process of updating its eGFR calculation to the ?  2021 CKD-EPI creatinine equation that estimates kidney function ?  without a race variable. ?  ? ?eGFR  ?Date Value Ref Range Status  ?04/19/2021 50 (L) >59 mL/min/1.73 Final  ? ?GFR, Estimated  ?Date Value Ref Range Status  ?12/31/2020 >60 >60 mL/min Final  ?  Comment:  ?  (NOTE) ?Calculated using the CKD-EPI Creatinine Equation (2021) ?  ? ?  ?  ?  Passed - Completed PHQ-2 or PHQ-9 in the last 360 days  ?  ?  Passed - Last Heart Rate in normal range  ?  Pulse Readings from Last 1 Encounters:  ?01/29/22 89  ?  ?  ?  ?  Passed - Valid encounter within last 6 months  ?  Recent Outpatient Visits   ? ?      ? Today Type 2 diabetes mellitus with hyperglycemia, with long-term current use of insulin ([place])  ? [place] Elsie Stain, MD  ? 4 months ago Type 2 diabetes mellitus with hyperglycemia, with long-term current use of insulin ([place])  ? [place] Elsie Stain, MD  ? 8 months ago History of stroke  ? [place], [place], [place]  ? 1 year ago Cerebrovascular accident (CVA), unspecified mechanism ([place])  ? [place] Elsie Stain, MD  ? 1 year ago Type 2 diabetes mellitus with hyperglycemia, with long-term current use of insulin ([place])  ? [place] Elsie Stain, MD  ? ?  ?  ?Future Appointments   ? ?        ? In 4 weeks Daisy Blossom, [place], [place]  ? In 2 months Elsie Stain, MD [place]  ? ?  ? ? ?  ?  ?  Passed - CBC within normal limits and completed in the last  12 months  ?  WBC  ?Date Value Ref Range Status  ?04/11/2021 10.3 3.4 - 10.8  x10E3/uL Final  ?12/31/2020 9.5 4.0 - 10.5 K/uL Final  ? ?RBC  ?Date Value Ref Range Status  ?04/11/2021 4.70 3.77 - 5.28 x10E6/uL Final  ?12/31/2020 3.69 (L) 3.87 - 5.11 MIL/uL Final  ? ?Hemoglobin  ?Date Value Ref Range Status  ?04/11/2021 13.6 11.1 - 15.9 g/dL Final  ? ?Hematocrit  ?Date Value Ref Range Status  ?04/11/2021 43.2 34.0 - 46.6 % Final  ? ?MCHC  ?Date Value Ref Range Status  ?04/11/2021 31.5 31.5 - 35.7 g/dL Final  ?12/31/2020 32.6 30.0 - 36.0 g/dL Final  ? ?MCH  ?Date Value Ref Range Status  ?04/11/2021 28.9 26.6 - 33.0 pg Final  ?12/31/2020 30.4 26.0 - 34.0 pg Final  ? ?MCV  ?Date Value Ref Range Status  ?04/11/2021 92 79 - 97 fL Final  ? ?No results found for: [facility], LAB[facility], [place] ?RDW  ?Date Value Ref Range Status  ?04/11/2021 15.6 (H) 11.7 - 15.4 % Final  ? ?  ?  ?  ? ? ? ? ?

## 2022-01-29 NOTE — Telephone Encounter (Signed)
I saw this patient today and she cannot afford her groceries is a severe diabetic A1c of 9.8 she does not have Medicaid does not qualify for snap any possibilities or food programs we can point her in the right direction for such as out of the garden or some other food pantry they can give her healthy food choices please connect with her thank you ?

## 2022-01-29 NOTE — Assessment & Plan Note (Signed)
Uncontrolled diabetic with poor access to healthy foods ? ?We will connect with clinical case management to see if we can get her access to healthier food choices ? ?Increase Levemir to 40 units in the morning 20 units in the evening and continue glipizide ? ?Follow-up with clinical pharmacy ?

## 2022-01-29 NOTE — Assessment & Plan Note (Signed)
Not engaged around smoking cessation ?

## 2022-01-30 NOTE — Telephone Encounter (Signed)
I called patient and explained why I was calling and she said it was not a good time to talk. I told her that I would call her back on Mon 02/03/2022 and she said that was fine. Marland Kitchen  ?

## 2022-02-03 NOTE — Telephone Encounter (Signed)
I called patient today regarding nutrition resources.  She explained that she already has enough canned foods and is really just interested in getting meat.  She said she is connected with [employer] and regularly receives food there. She has not heard of [employer]. I offered to mail her a list of resources for food pantries and she was very appreciative and can make calls to inquire about the availability of meats.  ?

## 2022-02-04 NOTE — Telephone Encounter (Signed)
Information regarding [employer] as well as the May schedule for [employer] [place] [employer] and [employer] was mailed to patient ?

## 2022-02-11 ENCOUNTER — Ambulatory Visit: Payer: Self-pay | Admitting: Critical Care Medicine

## 2022-02-17 MED ORDER — QUETIAPINE FUMARATE 400 MG PO TABS: 400.0000 mg | ORAL_TABLET | Freq: Two times a day (BID) | ORAL | 1 refills | Status: DC

## 2022-02-27 ENCOUNTER — Ambulatory Visit: Payer: Self-pay | Admitting: Pharmacist

## 2022-03-17 ENCOUNTER — Other Ambulatory Visit: Payer: Self-pay

## 2022-03-17 ENCOUNTER — Ambulatory Visit: Payer: Self-pay | Attending: Critical Care Medicine | Admitting: Pharmacist

## 2022-03-17 ENCOUNTER — Encounter: Payer: Self-pay | Admitting: Pharmacist

## 2022-03-17 DIAGNOSIS — E1165 Type 2 diabetes mellitus with hyperglycemia: Secondary | ICD-10-CM

## 2022-03-17 DIAGNOSIS — Z794 Long term (current) use of insulin: Secondary | ICD-10-CM

## 2022-03-17 DIAGNOSIS — F203 Undifferentiated schizophrenia: Secondary | ICD-10-CM

## 2022-03-17 DIAGNOSIS — E782 Mixed hyperlipidemia: Secondary | ICD-10-CM

## 2022-03-17 MED ORDER — TIZANIDINE HCL 4 MG PO TABS
ORAL_TABLET | ORAL | 2 refills | Status: DC
  Filled 2022-03-17: qty 30, 5d supply, fill #0

## 2022-03-17 MED ORDER — GLIPIZIDE ER 10 MG PO TB24
20.0000 mg | ORAL_TABLET | Freq: Every day | ORAL | 4 refills | Status: DC
  Filled 2022-03-17: qty 60, 30d supply, fill #0
  Filled 2022-07-02: qty 60, 30d supply, fill #1

## 2022-03-17 MED ORDER — BASAGLAR KWIKPEN 100 UNIT/ML ~~LOC~~ SOPN
60.0000 [IU] | PEN_INJECTOR | Freq: Every day | SUBCUTANEOUS | 2 refills | Status: DC
  Filled 2022-03-17: qty 15, 25d supply, fill #0
  Filled 2022-05-13 – 2022-06-06 (×3): qty 15, 25d supply, fill #1
  Filled 2022-07-02: qty 15, 25d supply, fill #2

## 2022-03-17 MED ORDER — QUETIAPINE FUMARATE 400 MG PO TABS
400.0000 mg | ORAL_TABLET | Freq: Two times a day (BID) | ORAL | 1 refills | Status: DC
  Filled 2022-03-17: qty 60, 30d supply, fill #0
  Filled 2022-04-16: qty 60, 30d supply, fill #1

## 2022-03-17 MED ORDER — TRUE METRIX METER W/DEVICE KIT
PACK | 0 refills | Status: AC
  Filled 2022-03-17: qty 1, 30d supply, fill #0

## 2022-03-17 MED ORDER — TRUEPLUS LANCETS 28G MISC
3 refills | Status: DC
  Filled 2022-03-17: qty 100, 33d supply, fill #0

## 2022-03-17 MED ORDER — ROSUVASTATIN CALCIUM 40 MG PO TABS
40.0000 mg | ORAL_TABLET | Freq: Every day | ORAL | 1 refills | Status: DC
  Filled 2022-03-17: qty 30, 30d supply, fill #0
  Filled 2022-07-02: qty 30, 30d supply, fill #1

## 2022-03-17 MED ORDER — TECHLITE PEN NEEDLES 32G X 4 MM MISC
3 refills | Status: DC
  Filled 2022-03-17: qty 100, 100d supply, fill #0

## 2022-03-17 MED ORDER — TRUE METRIX BLOOD GLUCOSE TEST VI STRP
ORAL_STRIP | 2 refills | Status: DC
  Filled 2022-03-17: qty 100, 33d supply, fill #0

## 2022-03-17 MED ORDER — TRULICITY 0.75 MG/0.5ML ~~LOC~~ SOAJ
0.7500 mg | SUBCUTANEOUS | 1 refills | Status: DC
  Filled 2022-03-17: qty 2, 28d supply, fill #0
  Filled 2022-05-13 – 2022-05-30 (×4): qty 2, 28d supply, fill #1

## 2022-03-17 NOTE — Progress Notes (Signed)
S:    Ashley [name] is a 61 y.o. female who presents for diabetes evaluation, education, and management. PMH is significant for T2DM, bipolar disorder, HLD, HTN, PVD, stroke, CKD. Patient was referred and last seen by Primary Care Provider, [name], on 01/29/22. A1c was 9.8 at that time and Levemir was increased.   Today, patient arrives in good spirits and presents without any assistance. She reports being unable to refill her Levemir due to it costing a few hundred dollars. She has one pen left and is currently taking it as prescribed. She would like to fill all of her medications at our pharmacy downstairs so that they are more affordable since she does not have insurance.   Family/Social History: Current smoker  Current diabetes medications include: Levemir 40 units qAM + 20 units qPM, glipizide XL 20 mg daily  Patient reports taking all medications as prescribed. Patient reports adherence with medications. Patient reports missing her medications 0 times per week, on average.  Do you feel that your medications are working for you? yes Have you been experiencing any side effects to the medications prescribed? no Do you have any problems obtaining medications due to transportation or finances? yes - is not able to get her Levemir due to cost Insurance coverage: self pay  Patient denies hypoglycemic events.  Reported home fasting blood sugars: 150, 177  Reported 2 hour post-meal/random blood sugars: 288  Patient reports nocturia (nighttime urination).  Patient reports neuropathy (nerve pain). Patient denies visual changes. Patient reports self foot exams.   Patient reported dietary habits: Eats 2 meals/day + snacks  Patient-reported exercise habits: none currently  O:   Lab Results  Component Value Date   HGBA1C 9.8 (A) 01/29/2022   There were no vitals filed for this visit.  Lipid Panel     Component Value Date/Time   CHOL 314 (H) 11/12/2020 0940   TRIG 386 (H)  11/12/2020 0940   HDL 42 11/12/2020 0940   CHOLHDL 7.5 (H) 11/12/2020 0940   CHOLHDL 10.6 (H) 07/02/2016 1105   VLDL NOT CALC 07/02/2016 1105   LDLCALC 194 (H) 11/12/2020 0940   Clinical Atherosclerotic Cardiovascular Disease (ASCVD): Yes  The ASCVD Risk score (Arnett DK, et al., 2019) failed to calculate for the following reasons:   The patient has a prior MI or stroke diagnosis   A/P: Diabetes longstanding currently uncontrolled given last A1c 9.8. Patient is able to verbalize appropriate hypoglycemia management plan. Medication adherence appears to be improving. She seems to be more engaged in improving her health. Due to high cost of Levemir, will switch BID Levemir to daily Basaglar which is free at our pharmacy. Will also start GLP-1 RA with CV and renal benefit given history of stroke and CKD and to better improve glycemic control. Will hopefully be able to decrease glipizide in the future as we increase Trulicity as tolerated to reduce hypoglycemic risk.  -Start Trulicity 9.37 mg weekly.  -Switch Levemir to Basaglar 60 units daily.  -Continue glipizide XL 20 mg daily.  -Patient educated on purpose, proper use, and potential adverse effects of Trulicity.  -Extensively discussed pathophysiology of diabetes, recommended lifestyle interventions, dietary effects on blood sugar control.  -Counseled on s/sx of and management of hypoglycemia.  -Next A1c anticipated July 2023.   Written patient instructions provided. Patient verbalized understanding of treatment plan. Total time in face to face counseling 28 minutes.    Follow up PCP clinic visit on 04/03/22.   Rebbeca Paul, PharmD  PGY2 Ambulatory Care Pharmacy Resident 03/17/2022 2:24 PM

## 2022-03-21 ENCOUNTER — Other Ambulatory Visit: Payer: Self-pay

## 2022-04-03 ENCOUNTER — Encounter: Payer: Self-pay | Admitting: Critical Care Medicine

## 2022-04-03 ENCOUNTER — Other Ambulatory Visit: Payer: Self-pay

## 2022-04-03 ENCOUNTER — Ambulatory Visit: Payer: Self-pay | Attending: Critical Care Medicine | Admitting: Critical Care Medicine

## 2022-04-03 VITALS — BP 158/97 | HR 93 | Temp 98.1°F | Ht 67.0 in | Wt 141.8 lb

## 2022-04-03 DIAGNOSIS — Z794 Long term (current) use of insulin: Secondary | ICD-10-CM

## 2022-04-03 DIAGNOSIS — R200 Anesthesia of skin: Secondary | ICD-10-CM | POA: Insufficient documentation

## 2022-04-03 DIAGNOSIS — N289 Disorder of kidney and ureter, unspecified: Secondary | ICD-10-CM

## 2022-04-03 DIAGNOSIS — E1122 Type 2 diabetes mellitus with diabetic chronic kidney disease: Secondary | ICD-10-CM | POA: Insufficient documentation

## 2022-04-03 DIAGNOSIS — F17210 Nicotine dependence, cigarettes, uncomplicated: Secondary | ICD-10-CM | POA: Insufficient documentation

## 2022-04-03 DIAGNOSIS — E785 Hyperlipidemia, unspecified: Secondary | ICD-10-CM

## 2022-04-03 DIAGNOSIS — M25551 Pain in right hip: Secondary | ICD-10-CM | POA: Insufficient documentation

## 2022-04-03 DIAGNOSIS — E1165 Type 2 diabetes mellitus with hyperglycemia: Secondary | ICD-10-CM

## 2022-04-03 DIAGNOSIS — Z79899 Other long term (current) drug therapy: Secondary | ICD-10-CM | POA: Insufficient documentation

## 2022-04-03 DIAGNOSIS — Z720 Tobacco use: Secondary | ICD-10-CM

## 2022-04-03 DIAGNOSIS — I10 Essential (primary) hypertension: Secondary | ICD-10-CM

## 2022-04-03 DIAGNOSIS — I129 Hypertensive chronic kidney disease with stage 1 through stage 4 chronic kidney disease, or unspecified chronic kidney disease: Secondary | ICD-10-CM | POA: Insufficient documentation

## 2022-04-03 LAB — POCT GLYCOSYLATED HEMOGLOBIN (HGB A1C): HbA1c, POC (controlled diabetic range): 9 % — AB (ref 0.0–7.0)

## 2022-04-03 LAB — GLUCOSE, POCT (MANUAL RESULT ENTRY): POC Glucose: 278 mg/dl — AB (ref 70–99)

## 2022-04-03 MED ORDER — AMLODIPINE BESYLATE 5 MG PO TABS
5.0000 mg | ORAL_TABLET | Freq: Every day | ORAL | 1 refills | Status: DC
  Filled 2022-04-03: qty 60, 60d supply, fill #0

## 2022-04-03 NOTE — Assessment & Plan Note (Signed)
  .   Current smoking consumption amount: 1 pack a day  Dicsussion on advise to quit smoking and smoking impacts: Cardiovascular impacts  Patient's willingness to quit: Not engaged to quit  Methods to quit smoking discussed: Behavioral modification  Medication management of smoking session drugs discussed: Nicotine replacement  Resources provided:  AVS   Setting quit date not established  Follow-up arranged 2 months   Time spent counseling the patient: 5 minutes  

## 2022-04-03 NOTE — Progress Notes (Signed)
Established Patient Office Visit  Subjective:  Patient ID: Ashley [name], female    DOB: 02-09-1961  Age: 61 y.o. MRN: [id number]  CC:  Chief Complaint  Patient presents with   Diabetes    HPI 08/2022 Ashley [name] presents for primary care follow-up.  Patient with history of type 2 diabetes bipolar disorder hyperlipidemia  The patient is coming up on the first anniversary of the death of her daughter at age 37 of lupus.  She has been very depressed and disengaged around her health.  She had not been taking her insulin product or Glucotrol.  On arrival blood sugar was 435 and A1c was 10.6.  She does complain of numbness in the fingers and right hip pain.  She declines to receive any vaccinations at this time.  She does have a colon cancer screening she states she will consider following through on this.  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 The patient appears to recover from that surgery.  She states the numbness in the fingers were not occurring prior to the cervical surgery.  Patient does not have any other complaints at this time.  Her PHQ 9 and GAD-7 are elevated but she is not suicidal.  She still smokes a pack a day of cigarettes and is not engaged around quitting smoking at this time.  Also she is taking Seroquel on a regular basis but declines to see a provider for her mental health.  01/29/22 Patient returns in follow-up for hypertension on arrival blood pressure 120/80.  Blood sugar 194 A1c 9.8 down point from 10.6.  Not able to buy healthy food does not have Medicaid.  Patient needs to schedule her mammogram.  Has no other complaints at this visit.  She wants to give plasma. Continue glipizide  6/29 Patient seen in return follow-up since the last visit he did see [name] in clinical pharmacy  several weeks ago and now has had Trulicity added to her program and is been switched to Basaglar 60 units daily and discontinuing glipizide daily.  Patient has no real complaints today except that she does not like where she is living she does not get on along well with the individual running her apartment complex.  She is trying to move into a different complex.  Patient still smoking a cigarette pack a day  Blood pressure on arrival was elevated 146/92 Patient's not on any blood pressure medicine she had been normal tensive previously but has slipped gradually back into the hypertensive state owing to the fact that she continues to smoke and is under a great deal of stress.  Patient has no other complaints.  Below is clinical pharmacy visit Saw [name] 03/17/22: Diabetes longstanding currently uncontrolled given last A1c 9.8. Patient is able to verbalize appropriate hypoglycemia management plan. Medication adherence appears to be improving. She seems to be more engaged in improving her health. Due to high cost of Levemir, will switch BID Levemir to daily Basaglar which is free at our pharmacy. Will also start GLP-1 RA with CV and renal benefit given history of stroke and CKD and to better improve glycemic control. Will hopefully be able to decrease glipizide in the future as we increase Trulicity as tolerated to reduce hypoglycemic risk.  -Start Trulicity 2.94 mg weekly.  -Switch Levemir to Basaglar 60  units daily.  -Continue glipizide XL 20 mg daily.  -Patient educated on purpose, proper use, and potential adverse effects of Trulicity.  -Extensively discussed pathophysiology of diabetes, recommended lifestyle interventions, dietary effects on blood sugar control.  -Counseled on s/sx of and management of hypoglycemia.  -Next A1c anticipated July 2023.     Past Medical History:  Diagnosis Date   Anxiety    Arthritis    Bipolar 1 disorder ([place])    Cervical myelopathy ([place]) 12/28/2020   Cervical  spinal cord compression (HCC) 12/19/2020   Depression    Diabetes mellitus without complication (HCC)    Type II   GERD (gastroesophageal reflux disease)    Heart murmur    "nothing to worry about"   HNP (herniated nucleus pulposus) with myelopathy, cervical    HTN (hypertension)    not on medication   Quadriplegia and quadriparesis ([place])    Stroke ([place]) 11/2020    Past Surgical History:  Procedure Laterality Date   ABDOMINAL HYSTERECTOMY     ANTERIOR CERVICAL DECOMP/DISCECTOMY FUSION N/A 12/25/2020   Procedure: CERVICAL THREE-FOUR ANTERIOR CERVICAL DECOMPRESSION/DISCECTOMY FUSION;  Surgeon: Judith Part, MD;  Location: [facility];  Service: Neurosurgery;  Laterality: N/A;  CERVICAL THREE-FOUR ANTERIOR CERVICAL DECOMPRESSION/DISCECTOMY FUSION    LUMBAR LAMINECTOMY/DECOMPRESSION MICRODISCECTOMY Left 04/07/2016   Procedure: Left Lumbar four-five Microdiskectomy;  Surgeon: Erline Levine, MD;  Location: [facility];  Service: Neurosurgery;  Laterality: Left;    Family History  Problem Relation Age of Onset   Healthy Mother    Diabetes Mellitus II Father    Breast cancer Sister    Diabetes Brother    Heart disease Neg Hx    Stroke Neg Hx    Kidney disease Neg Hx     Social History   Socioeconomic History   Marital status: Divorced    Spouse name: Not on file   Number of children: 3   Years of education: Not on file   Highest education level: High school graduate  Occupational History   Not on file  Tobacco Use   Smoking status: Every Day    Packs/day: 1.00    Years: 39.00    Total pack years: 39.00    Types: Cigarettes   Smokeless tobacco: Never  Vaping Use   Vaping Use: Never used  Substance and Sexual Activity   Alcohol use: No   Drug use: Yes    Types: Marijuana    Comment: Cocaine -- 04/06/16- last time   Sexual activity: Yes    Birth control/protection: Surgical  Other Topics Concern   Not on file  Social History Narrative   Lives with daughter   Right  Handed   Drinks 1 cup of coffee per day      Social Determinants of Health   Financial Resource Strain: Not on file  Food Insecurity: Not on file  Transportation Needs: Not on file  Physical Activity: Not on file  Stress: Not on file  Social Connections: Not on file  Intimate Partner Violence: Not on file    Outpatient Medications Prior to Visit  Medication Sig Dispense Refill   acetaminophen (TYLENOL) 325 MG tablet Take 1-2 tablets (325-650 mg total) by mouth every 4 (four) hours as needed for mild pain. 60 tablet 1   ascorbic acid (VITAMIN C) 500 MG tablet Take 1 tablet (500 mg total) by mouth daily.     Blood Glucose Monitoring Suppl (TRUE METRIX METER) w/Device KIT Use to check blood sugar up to 3  times daily. 1 kit 0   Dulaglutide (TRULICITY) 7.65 YY/5.0PT SOPN Inject 0.75 mg into the skin once a week. 2 mL 1   glipiZIDE (GLUCOTROL XL) 10 MG 24 hr tablet Take 2 tablets (20 mg total) by mouth once daily. 60 tablet 4   glucose blood (TRUE METRIX BLOOD GLUCOSE TEST) test strip Use to check blood sugar up to 3 times daily. 100 each 2   Insulin Glargine (BASAGLAR KWIKPEN) 100 UNIT/ML Inject 60 Units into the skin once daily. 15 mL 2   Insulin Pen Needle (TECHLITE PEN NEEDLES) 32G X 4 MM MISC Use as directed. 100 each 3   meloxicam (MOBIC) 15 MG tablet Take 1 tablet (15 mg total) by mouth daily. 30 tablet 1   Multiple Vitamin (MULTIVITAMIN WITH MINERALS) TABS tablet Take 1 tablet by mouth daily.     QUEtiapine (SEROQUEL) 400 MG tablet Take 1 tablet (400 mg total) by mouth 2 (two) times daily. 60 tablet 1   rosuvastatin (CRESTOR) 40 MG tablet Take 1 tablet (40 mg total) by mouth once daily. 90 tablet 1   tiZANidine (ZANAFLEX) 4 MG tablet Take 2 tablet(s) by mouth 1 to 3 times daily as needed. 30 tablet 2   TRUEplus Lancets 28G MISC Use to check blood sugar up to 3 times daily. 100 each 3   No facility-administered medications prior to visit.    No Known Allergies  ROS Review of  Systems  Constitutional:  Negative for chills, diaphoresis and fever.  HENT:  Negative for congestion, hearing loss, nosebleeds, sore throat and tinnitus.   Eyes:  Negative for photophobia and redness.  Respiratory:  Negative for cough, shortness of breath, wheezing and stridor.   Cardiovascular:  Negative for chest pain, palpitations and leg swelling.  Gastrointestinal:  Negative for abdominal pain, blood in stool, constipation, diarrhea, nausea and vomiting.  Endocrine: Negative for polydipsia.  Genitourinary:  Negative for dysuria, flank pain, frequency, hematuria and urgency.  Musculoskeletal:  Negative for back pain, myalgias and neck pain.  Skin:  Negative for rash.  Allergic/Immunologic: Negative for environmental allergies.  Neurological:  Positive for numbness. Negative for dizziness, tremors, seizures, weakness and headaches.  Hematological:  Does not bruise/bleed easily.  Psychiatric/Behavioral:  Negative for suicidal ideas. The patient is not nervous/anxious.       Objective:    Physical Exam Vitals reviewed.  Constitutional:      Appearance: Normal appearance. She is well-developed and normal weight. She is not diaphoretic.  HENT:     Head: Normocephalic and atraumatic.     Nose: No nasal deformity, septal deviation, mucosal edema or rhinorrhea.     Right Sinus: No maxillary sinus tenderness or frontal sinus tenderness.     Left Sinus: No maxillary sinus tenderness or frontal sinus tenderness.     Mouth/Throat:     Pharynx: No oropharyngeal exudate.  Eyes:     General: No scleral icterus.    Conjunctiva/sclera: Conjunctivae normal.     Pupils: Pupils are equal, round, and reactive to light.  Neck:     Thyroid: No thyromegaly.     Vascular: No carotid bruit or JVD.     Trachea: Trachea normal. No tracheal tenderness or tracheal deviation.  Cardiovascular:     Rate and Rhythm: Normal rate and regular rhythm.     Chest Wall: PMI is not displaced.     Pulses: Normal  pulses. No decreased pulses.     Heart sounds: Normal heart sounds, S1 normal and S2 normal.  Heart sounds not distant. No murmur heard.    No systolic murmur is present.     No diastolic murmur is present.     No friction rub. No gallop. No S3 or S4 sounds.  Pulmonary:     Effort: No tachypnea, accessory muscle usage or respiratory distress.     Breath sounds: No stridor. No decreased breath sounds, wheezing, rhonchi or rales.  Chest:     Chest wall: No tenderness.  Abdominal:     General: Bowel sounds are normal. There is no distension.     Palpations: Abdomen is soft. Abdomen is not rigid.     Tenderness: There is no abdominal tenderness. There is no guarding or rebound.  Musculoskeletal:        General: Normal range of motion.     Cervical back: Normal range of motion and neck supple. No edema, erythema or rigidity. No muscular tenderness. Normal range of motion.  Lymphadenopathy:     Head:     Right side of head: No submental or submandibular adenopathy.     Left side of head: No submental or submandibular adenopathy.     Cervical: No cervical adenopathy.  Skin:    General: Skin is warm and dry.     Coloration: Skin is not pale.     Findings: No rash.     Nails: There is no clubbing.  Neurological:     Mental Status: She is alert and oriented to person, place, and time.     Sensory: No sensory deficit.  Psychiatric:        Attention and Perception: Attention normal.        Mood and Affect: Mood is depressed.        Speech: Speech normal.        Behavior: Behavior normal. Behavior is cooperative.        Thought Content: Thought content does not include homicidal or suicidal ideation.        Cognition and Memory: Cognition and memory normal.        Judgment: Judgment normal.     BP (!) 158/97   Pulse 93   Temp 98.1 F (36.7 C) (Oral)   Ht 5' 7"  (1.702 m)   Wt 141 lb 12.8 oz (64.3 kg)   SpO2 100%   BMI 22.21 kg/m  Wt Readings from Last 3 Encounters:  04/03/22 141  lb 12.8 oz (64.3 kg)  03/17/22 149 lb (67.6 kg)  01/29/22 147 lb (66.7 kg)     Health Maintenance Due  Topic Date Due   OPHTHALMOLOGY EXAM  11/30/2020    There are no preventive care reminders to display for this patient.  Lab Results  Component Value Date   TSH 0.970 11/12/2020   Lab Results  Component Value Date   WBC 10.3 04/11/2021   HGB 13.6 04/11/2021   HCT 43.2 04/11/2021   MCV 92 04/11/2021   PLT 449 04/11/2021   Lab Results  Component Value Date   NA 141 04/19/2021   K 3.9 04/19/2021   CO2 27 12/31/2020   GLUCOSE 159 (H) 04/19/2021   BUN 18 04/19/2021   CREATININE 1.23 (H) 04/19/2021   BILITOT <0.2 04/19/2021   ALKPHOS 164 (H) 04/19/2021   AST 42 (H) 04/19/2021   ALT 60 (H) 12/29/2020   PROT 7.3 04/19/2021   ALBUMIN 4.6 04/19/2021   CALCIUM 10.0 04/19/2021   ANIONGAP 7 12/31/2020   EGFR 50 (L) 04/19/2021   Lab Results  Component Value  Date   CHOL 314 (H) 11/12/2020   Lab Results  Component Value Date   HDL 42 11/12/2020   Lab Results  Component Value Date   LDLCALC 194 (H) 11/12/2020   Lab Results  Component Value Date   TRIG 386 (H) 11/12/2020   Lab Results  Component Value Date   CHOLHDL 7.5 (H) 11/12/2020   Lab Results  Component Value Date   HGBA1C 9.0 (A) 04/03/2022      Assessment & Plan:   Problem List Items Addressed This Visit       Cardiovascular and Mediastinum   Essential hypertension    Blood pressure not controlled at this time plan is to resume amlodipine 5 mg daily      Relevant Medications   amLODipine (NORVASC) 5 MG tablet     Endocrine   Uncontrolled type 2 diabetes mellitus with hyperglycemia (HCC)    Type 2 diabetes not well controlled plan for this patient is to maintain Trulicity at current dose level maintain Basaglar and glipizide continue to work on diet        Genitourinary   Renal insufficiency    We will need to monitor  renal function        Other   Hyperlipidemia    Continue  high-dose lipid therapy      Relevant Medications   amLODipine (NORVASC) 5 MG tablet   Tobacco user       Current smoking consumption amount: 1 pack a day  Dicsussion on advise to quit smoking and smoking impacts: Cardiovascular impacts  Patient's willingness to quit: Not engaged to quit  Methods to quit smoking discussed: Behavioral modification  Medication management of smoking session drugs discussed: Nicotine replacement  Resources provided:  AVS   Setting quit date not established  Follow-up arranged 2 months   Time spent counseling the patient: 5 minutes       Other Visit Diagnoses     Type 2 diabetes mellitus with hyperglycemia, with long-term current use of insulin ([place])    -  Primary   Relevant Orders   HgB A1c (Completed)   POCT glucose (manual entry) (Completed)      Meds ordered this encounter  Medications   amLODipine (NORVASC) 5 MG tablet    Sig: Take 1 tablet (5 mg total) by mouth daily.    Dispense:  60 tablet    Refill:  1    Follow-up: Return in about 2 months (around 06/03/2022).    Asencion Noble, MD

## 2022-04-03 NOTE — Assessment & Plan Note (Signed)
Blood pressure not controlled at this time plan is to resume amlodipine 5 mg daily

## 2022-04-03 NOTE — Patient Instructions (Signed)
Start amlodipine 1 pill daily  No change in other medication  Focus on reducing your tobacco intake see attachment  Return to see [name] 2 months

## 2022-04-03 NOTE — Assessment & Plan Note (Signed)
Continue high-dose lipid therapy

## 2022-04-03 NOTE — Assessment & Plan Note (Signed)
We will need to monitor  renal function

## 2022-04-03 NOTE — Assessment & Plan Note (Signed)
Type 2 diabetes not well controlled plan for this patient is to maintain Trulicity at current dose level maintain Basaglar and glipizide continue to work on diet

## 2022-04-09 ENCOUNTER — Other Ambulatory Visit: Payer: Self-pay

## 2022-04-10 ENCOUNTER — Inpatient Hospital Stay: Admission: RE | Admit: 2022-04-10 | Payer: Self-pay | Source: Ambulatory Visit

## 2022-04-11 ENCOUNTER — Ambulatory Visit: Payer: Self-pay

## 2022-04-11 ENCOUNTER — Ambulatory Visit
Admission: RE | Admit: 2022-04-11 | Discharge: 2022-04-11 | Disposition: A | Discharge status: Home / Self Care | Payer: No Typology Code available for payment source | Source: Ambulatory Visit | Attending: Physician Assistant | Admitting: Physician Assistant

## 2022-04-11 DIAGNOSIS — Z1231 Encounter for screening mammogram for malignant neoplasm of breast: Secondary | ICD-10-CM

## 2022-04-16 ENCOUNTER — Other Ambulatory Visit: Payer: Self-pay

## 2022-04-22 ENCOUNTER — Ambulatory Visit: Payer: Self-pay | Admitting: Pharmacist

## 2022-05-13 ENCOUNTER — Other Ambulatory Visit: Payer: Self-pay

## 2022-05-14 ENCOUNTER — Other Ambulatory Visit: Payer: Self-pay

## 2022-05-14 MED ORDER — QUETIAPINE FUMARATE 400 MG PO TABS
ORAL_TABLET | ORAL | 1 refills | Status: DC
  Filled 2022-05-14: qty 60, 30d supply, fill #0

## 2022-05-15 ENCOUNTER — Other Ambulatory Visit: Payer: Self-pay

## 2022-05-28 ENCOUNTER — Other Ambulatory Visit: Payer: Self-pay

## 2022-05-30 ENCOUNTER — Ambulatory Visit: Payer: Self-pay | Attending: Critical Care Medicine | Admitting: Pharmacist

## 2022-05-30 ENCOUNTER — Other Ambulatory Visit: Payer: Self-pay

## 2022-05-30 DIAGNOSIS — E1165 Type 2 diabetes mellitus with hyperglycemia: Secondary | ICD-10-CM

## 2022-05-30 DIAGNOSIS — Z794 Long term (current) use of insulin: Secondary | ICD-10-CM

## 2022-05-30 MED ORDER — TRULICITY 1.5 MG/0.5ML ~~LOC~~ SOAJ
1.5000 mg | SUBCUTANEOUS | 3 refills | Status: DC
  Filled 2022-05-30 – 2022-06-06 (×2): qty 2, 28d supply, fill #0
  Filled 2022-07-02: qty 2, 28d supply, fill #1

## 2022-05-30 MED ORDER — QUETIAPINE FUMARATE 400 MG PO TABS
ORAL_TABLET | ORAL | 1 refills | Status: DC
  Filled 2022-05-30: qty 60, fill #0

## 2022-05-30 NOTE — Progress Notes (Signed)
   S:    Ashley [name] is a 61 y.o. female who presents for diabetes evaluation, education, and management. PMH is significant for T2DM, bipolar disorder, HLD, HTN, PVD, stroke, CKD. Patient was referred and last seen by Primary Care Provider, [name], on 04/03/2022. A1c was 9.0 at that time.  Today, patient arrives in good spirits and presents without any assistance.  Family/Social History: Current smoker  Current diabetes medications include: Levemir 60 units daily, glipizide XL 20 mg daily, Trulicity 7.67 mg weekly   Patient reports taking all medications as prescribed.   Insurance coverage: self pay  Patient denies hypoglycemic events.  Reported home fasting blood sugars: 100s-200s. Tells me she no longer sees BG > 300. No meter with her today.   Patient reports nocturia (nighttime urination).  Patient reports neuropathy (nerve pain). Patient denies visual changes. Patient reports self foot exams.   Patient reported dietary habits: Eats 2 meals/day + snacks  Patient-reported exercise habits: none currently  O:   Lab Results  Component Value Date   HGBA1C 9.0 (A) 04/03/2022   There were no vitals filed for this visit.  Lipid Panel     Component Value Date/Time   CHOL 314 (H) 11/12/2020 0940   TRIG 386 (H) 11/12/2020 0940   HDL 42 11/12/2020 0940   CHOLHDL 7.5 (H) 11/12/2020 0940   CHOLHDL 10.6 (H) 07/02/2016 1105   VLDL NOT CALC 07/02/2016 1105   LDLCALC 194 (H) 11/12/2020 0940   Clinical Atherosclerotic Cardiovascular Disease (ASCVD): Yes  The ASCVD Risk score (Arnett DK, et al., 2019) failed to calculate for the following reasons:   The patient has a prior MI or stroke diagnosis   A/P: Diabetes longstanding currently uncontrolled given last A1c 9.0. Patient is able to verbalize appropriate hypoglycemia management plan. Medication adherence appears to be improving. She seems to be more engaged in improving her health. -Increase Trulicity to 1.5 mg weekly.   -Continue Basaglar 60 units daily.  -Continue glipizide XL 20 mg daily.  -Patient educated on purpose, proper use, and potential adverse effects of Trulicity.  -Extensively discussed pathophysiology of diabetes, recommended lifestyle interventions, dietary effects on blood sugar control.  -Counseled on s/sx of and management of hypoglycemia.  -Next A1c anticipated July 2023.   Written patient instructions provided. Patient verbalized understanding of treatment plan. Total time in face to face counseling 30 minutes.    Follow up PCP clinic visit on 06/2022.   Benard Halsted, PharmD, [place], [name] [phone]

## 2022-06-04 ENCOUNTER — Encounter: Payer: Self-pay | Admitting: Critical Care Medicine

## 2022-06-04 ENCOUNTER — Other Ambulatory Visit: Payer: Self-pay

## 2022-06-04 ENCOUNTER — Ambulatory Visit: Payer: Self-pay | Attending: Critical Care Medicine | Admitting: Critical Care Medicine

## 2022-06-04 VITALS — BP 158/103 | HR 80 | Temp 98.3°F | Resp 20 | Ht 67.0 in | Wt 149.0 lb

## 2022-06-04 DIAGNOSIS — Z2820 Immunization not carried out because of patient decision for unspecified reason: Secondary | ICD-10-CM

## 2022-06-04 DIAGNOSIS — Z7984 Long term (current) use of oral hypoglycemic drugs: Secondary | ICD-10-CM | POA: Insufficient documentation

## 2022-06-04 DIAGNOSIS — I10 Essential (primary) hypertension: Secondary | ICD-10-CM

## 2022-06-04 DIAGNOSIS — Z720 Tobacco use: Secondary | ICD-10-CM

## 2022-06-04 DIAGNOSIS — Z794 Long term (current) use of insulin: Secondary | ICD-10-CM

## 2022-06-04 DIAGNOSIS — F3170 Bipolar disorder, currently in remission, most recent episode unspecified: Secondary | ICD-10-CM | POA: Insufficient documentation

## 2022-06-04 DIAGNOSIS — R200 Anesthesia of skin: Secondary | ICD-10-CM | POA: Insufficient documentation

## 2022-06-04 DIAGNOSIS — E1165 Type 2 diabetes mellitus with hyperglycemia: Secondary | ICD-10-CM

## 2022-06-04 DIAGNOSIS — M25551 Pain in right hip: Secondary | ICD-10-CM | POA: Insufficient documentation

## 2022-06-04 DIAGNOSIS — M5136 Other intervertebral disc degeneration, lumbar region: Secondary | ICD-10-CM | POA: Insufficient documentation

## 2022-06-04 DIAGNOSIS — N189 Chronic kidney disease, unspecified: Secondary | ICD-10-CM | POA: Insufficient documentation

## 2022-06-04 DIAGNOSIS — Z8673 Personal history of transient ischemic attack (TIA), and cerebral infarction without residual deficits: Secondary | ICD-10-CM | POA: Insufficient documentation

## 2022-06-04 DIAGNOSIS — F17210 Nicotine dependence, cigarettes, uncomplicated: Secondary | ICD-10-CM | POA: Insufficient documentation

## 2022-06-04 DIAGNOSIS — G8929 Other chronic pain: Secondary | ICD-10-CM

## 2022-06-04 DIAGNOSIS — I129 Hypertensive chronic kidney disease with stage 1 through stage 4 chronic kidney disease, or unspecified chronic kidney disease: Secondary | ICD-10-CM | POA: Insufficient documentation

## 2022-06-04 DIAGNOSIS — M545 Low back pain, unspecified: Secondary | ICD-10-CM | POA: Insufficient documentation

## 2022-06-04 DIAGNOSIS — E1122 Type 2 diabetes mellitus with diabetic chronic kidney disease: Secondary | ICD-10-CM | POA: Insufficient documentation

## 2022-06-04 DIAGNOSIS — Z79899 Other long term (current) drug therapy: Secondary | ICD-10-CM | POA: Insufficient documentation

## 2022-06-04 LAB — POCT GLYCOSYLATED HEMOGLOBIN (HGB A1C): HbA1c, POC (controlled diabetic range): 8.1 % — AB (ref 0.0–7.0)

## 2022-06-04 LAB — GLUCOSE, POCT (MANUAL RESULT ENTRY): POC Glucose: 154 mg/dl — AB (ref 70–99)

## 2022-06-04 MED ORDER — QUETIAPINE FUMARATE 400 MG PO TABS
ORAL_TABLET | ORAL | 1 refills | Status: DC
Start: 2022-06-04 — End: 2022-07-09
  Filled 2022-06-04 – 2022-06-10 (×2): qty 60, 30d supply, fill #0
  Filled 2022-07-02 – 2022-07-07 (×2): qty 60, 30d supply, fill #1
  Filled ????-??-??: fill #1

## 2022-06-04 MED ORDER — AMLODIPINE BESYLATE 10 MG PO TABS
10.0000 mg | ORAL_TABLET | Freq: Every day | ORAL | 2 refills | Status: DC
  Filled 2022-06-04: qty 30, 30d supply, fill #0

## 2022-06-04 NOTE — Progress Notes (Signed)
Established Patient Office Visit  Subjective:  Patient ID: Ashley [name], female    DOB: 1961/08/15  Age: 61 y.o. MRN: [id number]  CC:  Chief Complaint  Patient presents with   Diabetes   Hypertension    HPI 08/2022 Ashley [name] presents for primary care follow-up.  Patient with history of type 2 diabetes bipolar disorder hyperlipidemia  The patient is coming up on the first anniversary of the death of her daughter at age 54 of lupus.  She has been very depressed and disengaged around her health.  She had not been taking her insulin product or Glucotrol.  On arrival blood sugar was 435 and A1c was 10.6.  She does complain of numbness in the fingers and right hip pain.  She declines to receive any vaccinations at this time.  She does have a colon cancer screening she states she will consider following through on this.  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 The patient appears to recover from that surgery.  She states the numbness in the fingers were not occurring prior to the cervical surgery.  Patient does not have any other complaints at this time.  Her PHQ 9 and GAD-7 are elevated but she is not suicidal.  She still smokes a pack a day of cigarettes and is not engaged around quitting smoking at this time.  Also she is taking Seroquel on a regular basis but declines to see a provider for her mental health.  01/29/22 Patient returns in follow-up for hypertension on arrival blood pressure 120/80.  Blood sugar 194 A1c 9.8 down point from 10.6.  Not able to buy healthy food does not have Medicaid.  Patient needs to schedule her mammogram.  Has no other complaints at this visit.  She wants to give plasma. Continue glipizide  6/29 Patient seen in return follow-up since the last visit he did see [name] in  clinical pharmacy several weeks ago and now has had Trulicity added to her program and is been switched to Basaglar 60 units daily and discontinuing glipizide daily.  Patient has no real complaints today except that she does not like where she is living she does not get on along well with the individual running her apartment complex.  She is trying to move into a different complex.  Patient still smoking a cigarette pack a day  Blood pressure on arrival was elevated 146/92 Patient's not on any blood pressure medicine she had been normal tensive previously but has slipped gradually back into the hypertensive state owing to the fact that she continues to smoke and is under a great deal of stress.  Patient has no other complaints.  Below is clinical pharmacy visit Saw [name] 03/17/22: Diabetes longstanding currently uncontrolled given last A1c 9.8. Patient is able to verbalize appropriate hypoglycemia management plan. Medication adherence appears to be improving. She seems to be more engaged in improving her health. Due to high cost of Levemir, will switch BID Levemir to daily Basaglar which is free at our pharmacy. Will also start GLP-1 RA with CV and renal benefit given history of stroke and CKD and to better improve glycemic control. Will hopefully be able to decrease glipizide in the future as we increase Trulicity as tolerated to reduce hypoglycemic risk.  -Start Trulicity 3.81 mg weekly.  -Switch Levemir  to Basaglar 60 units daily.  -Continue glipizide XL 20 mg daily.  -Patient educated on purpose, proper use, and potential adverse effects of Trulicity.  -Extensively discussed pathophysiology of diabetes, recommended lifestyle interventions, dietary effects on blood sugar control.  -Counseled on s/sx of and management of hypoglycemia.  -Next A1c anticipated July 2023.    8/30 Patient seen in return follow-up she is still smoking about 10 cigarettes daily on arrival blood pressure is elevated  158/103 and this does not change on a recheck.  Unfortunately she did not pick up and start the amlodipine from the last visit in June.  She states insurance co-pays are challenged for her.  She still complaining of low back pain had previous lumbar surgery would like another x-ray.  Blood sugar on arrival is 154 and A1c is down to 8.1.  She has been compliant with the Trulicity Basaglar and glipizide.  Past Medical History:  Diagnosis Date   Anxiety    Arthritis    Bipolar 1 disorder ([place])    Cervical myelopathy ([place]) 12/28/2020   Cervical spinal cord compression (HCC) 12/19/2020   Depression    Diabetes mellitus without complication (HCC)    Type II   GERD (gastroesophageal reflux disease)    Heart murmur    "nothing to worry about"   HNP (herniated nucleus pulposus) with myelopathy, cervical    HTN (hypertension)    not on medication   Quadriplegia and quadriparesis ([place])    Stroke ([place]) 11/2020    Past Surgical History:  Procedure Laterality Date   ABDOMINAL HYSTERECTOMY     ANTERIOR CERVICAL DECOMP/DISCECTOMY FUSION N/A 12/25/2020   Procedure: CERVICAL THREE-FOUR ANTERIOR CERVICAL DECOMPRESSION/DISCECTOMY FUSION;  Surgeon: Judith Part, MD;  Location: [facility];  Service: Neurosurgery;  Laterality: N/A;  CERVICAL THREE-FOUR ANTERIOR CERVICAL DECOMPRESSION/DISCECTOMY FUSION    LUMBAR LAMINECTOMY/DECOMPRESSION MICRODISCECTOMY Left 04/07/2016   Procedure: Left Lumbar four-five Microdiskectomy;  Surgeon: Erline Levine, MD;  Location: [facility];  Service: Neurosurgery;  Laterality: Left;    Family History  Problem Relation Age of Onset   Healthy Mother    Diabetes Mellitus II Father    Breast cancer Sister    Diabetes Brother    Heart disease Neg Hx    Stroke Neg Hx    Kidney disease Neg Hx     Social History   Socioeconomic History   Marital status: Divorced    Spouse name: Not on file   Number of children: 3   Years of education: Not on file   Highest education  level: High school graduate  Occupational History   Not on file  Tobacco Use   Smoking status: Every Day    Packs/day: 1.00    Years: 39.00    Total pack years: 39.00    Types: Cigarettes   Smokeless tobacco: Never  Vaping Use   Vaping Use: Never used  Substance and Sexual Activity   Alcohol use: No   Drug use: Yes    Types: Marijuana    Comment: Cocaine -- 04/06/16- last time   Sexual activity: Yes    Birth control/protection: Surgical  Other Topics Concern   Not on file  Social History Narrative   Lives with daughter   Right Handed   Drinks 1 cup of coffee per day      Social Determinants of Health   Financial Resource Strain: Not on file  Food Insecurity: Not on file  Transportation Needs: Not on file  Physical Activity: Not on  file  Stress: Not on file  Social Connections: Not on file  Intimate Partner Violence: Not on file    Outpatient Medications Prior to Visit  Medication Sig Dispense Refill   acetaminophen (TYLENOL) 325 MG tablet Take 1-2 tablets (325-650 mg total) by mouth every 4 (four) hours as needed for mild pain. 60 tablet 1   ascorbic acid (VITAMIN C) 500 MG tablet Take 1 tablet (500 mg total) by mouth daily.     Blood Glucose Monitoring Suppl (TRUE METRIX METER) w/Device KIT Use to check blood sugar up to 3 times daily. 1 kit 0   Dulaglutide (TRULICITY) 1.5 TK/2.4OX SOPN Inject 1.5 mg into the skin once a week. 2 mL 3   glipiZIDE (GLUCOTROL XL) 10 MG 24 hr tablet Take 2 tablets (20 mg total) by mouth once daily. 60 tablet 4   glucose blood (TRUE METRIX BLOOD GLUCOSE TEST) test strip Use to check blood sugar up to 3 times daily. 100 each 2   Insulin Glargine (BASAGLAR KWIKPEN) 100 UNIT/ML Inject 60 Units into the skin once daily. 15 mL 2   Insulin Pen Needle (TECHLITE PEN NEEDLES) 32G X 4 MM MISC Use as directed. 100 each 3   Multiple Vitamin (MULTIVITAMIN WITH MINERALS) TABS tablet Take 1 tablet by mouth daily.     rosuvastatin (CRESTOR) 40 MG tablet  Take 1 tablet (40 mg total) by mouth once daily. 90 tablet 1   tiZANidine (ZANAFLEX) 4 MG tablet Take 2 tablet(s) by mouth 1 to 3 times daily as needed. 30 tablet 2   TRUEplus Lancets 28G MISC Use to check blood sugar up to 3 times daily. 100 each 3   amLODipine (NORVASC) 5 MG tablet Take 1 tablet (5 mg total) by mouth daily. 60 tablet 1   meloxicam (MOBIC) 15 MG tablet Take 1 tablet (15 mg total) by mouth daily. 30 tablet 1   QUEtiapine (SEROQUEL) 400 MG tablet Take 1 tablet by mouth twice daily. 60 tablet 1   No facility-administered medications prior to visit.    No Known Allergies  ROS Review of Systems  Constitutional:  Negative for chills, diaphoresis and fever.  HENT:  Negative for congestion, hearing loss, nosebleeds, sore throat and tinnitus.   Eyes:  Negative for photophobia and redness.  Respiratory:  Negative for cough, shortness of breath, wheezing and stridor.   Cardiovascular:  Negative for chest pain, palpitations and leg swelling.  Gastrointestinal:  Negative for abdominal pain, blood in stool, constipation, diarrhea, nausea and vomiting.  Endocrine: Negative for polydipsia.  Genitourinary:  Negative for dysuria, flank pain, frequency, hematuria and urgency.  Musculoskeletal:  Positive for back pain. Negative for myalgias and neck pain.  Skin:  Negative for rash.  Allergic/Immunologic: Negative for environmental allergies.  Neurological:  Positive for numbness. Negative for dizziness, tremors, seizures, weakness and headaches.  Hematological:  Does not bruise/bleed easily.  Psychiatric/Behavioral:  Negative for suicidal ideas. The patient is not nervous/anxious.       Objective:    Physical Exam Vitals reviewed.  Constitutional:      Appearance: Normal appearance. She is well-developed and normal weight. She is not diaphoretic.  HENT:     Head: Normocephalic and atraumatic.     Nose: No nasal deformity, septal deviation, mucosal edema or rhinorrhea.     Right  Sinus: No maxillary sinus tenderness or frontal sinus tenderness.     Left Sinus: No maxillary sinus tenderness or frontal sinus tenderness.     Mouth/Throat:  Pharynx: No oropharyngeal exudate.  Eyes:     General: No scleral icterus.    Conjunctiva/sclera: Conjunctivae normal.     Pupils: Pupils are equal, round, and reactive to light.  Neck:     Thyroid: No thyromegaly.     Vascular: No carotid bruit or JVD.     Trachea: Trachea normal. No tracheal tenderness or tracheal deviation.  Cardiovascular:     Rate and Rhythm: Normal rate and regular rhythm.     Chest Wall: PMI is not displaced.     Pulses: Normal pulses. No decreased pulses.     Heart sounds: Normal heart sounds, S1 normal and S2 normal. Heart sounds not distant. No murmur heard.    No systolic murmur is present.     No diastolic murmur is present.     No friction rub. No gallop. No S3 or S4 sounds.  Pulmonary:     Effort: No tachypnea, accessory muscle usage or respiratory distress.     Breath sounds: No stridor. No decreased breath sounds, wheezing, rhonchi or rales.  Chest:     Chest wall: No tenderness.  Abdominal:     General: Bowel sounds are normal. There is no distension.     Palpations: Abdomen is soft. Abdomen is not rigid.     Tenderness: There is no abdominal tenderness. There is no guarding or rebound.  Musculoskeletal:        General: Tenderness present. Normal range of motion.     Cervical back: Normal range of motion and neck supple. No edema, erythema or rigidity. No muscular tenderness. Normal range of motion.     Comments: Tender lower lumbar spine area  Lymphadenopathy:     Head:     Right side of head: No submental or submandibular adenopathy.     Left side of head: No submental or submandibular adenopathy.     Cervical: No cervical adenopathy.  Skin:    General: Skin is warm and dry.     Coloration: Skin is not pale.     Findings: No rash.     Nails: There is no clubbing.  Neurological:      Mental Status: She is alert and oriented to person, place, and time.     Sensory: No sensory deficit.  Psychiatric:        Attention and Perception: Attention normal.        Mood and Affect: Mood is depressed.        Speech: Speech normal.        Behavior: Behavior normal. Behavior is cooperative.        Thought Content: Thought content does not include homicidal or suicidal ideation.        Cognition and Memory: Cognition and memory normal.        Judgment: Judgment normal.     BP (!) 158/103 (BP Location: Left Arm, Patient Position: Sitting, Cuff Size: Large)   Pulse 80   Temp 98.3 F (36.8 C) (Oral)   Resp 20   Ht 5' 7"  (1.702 m)   Wt 149 lb (67.6 kg)   SpO2 100%   BMI 23.34 kg/m  Wt Readings from Last 3 Encounters:  06/04/22 149 lb (67.6 kg)  04/03/22 141 lb 12.8 oz (64.3 kg)  03/17/22 149 lb (67.6 kg)     Health Maintenance Due  Topic Date Due   OPHTHALMOLOGY EXAM  11/30/2020    There are no preventive care reminders to display for this patient.  Lab Results  Component Value Date   TSH 0.970 11/12/2020   Lab Results  Component Value Date   WBC 10.3 04/11/2021   HGB 13.6 04/11/2021   HCT 43.2 04/11/2021   MCV 92 04/11/2021   PLT 449 04/11/2021   Lab Results  Component Value Date   NA 141 04/19/2021   K 3.9 04/19/2021   CO2 27 12/31/2020   GLUCOSE 159 (H) 04/19/2021   BUN 18 04/19/2021   CREATININE 1.23 (H) 04/19/2021   BILITOT <0.2 04/19/2021   ALKPHOS 164 (H) 04/19/2021   AST 42 (H) 04/19/2021   ALT 60 (H) 12/29/2020   PROT 7.3 04/19/2021   ALBUMIN 4.6 04/19/2021   CALCIUM 10.0 04/19/2021   ANIONGAP 7 12/31/2020   EGFR 50 (L) 04/19/2021   Lab Results  Component Value Date   CHOL 314 (H) 11/12/2020   Lab Results  Component Value Date   HDL 42 11/12/2020   Lab Results  Component Value Date   LDLCALC 194 (H) 11/12/2020   Lab Results  Component Value Date   TRIG 386 (H) 11/12/2020   Lab Results  Component Value Date   CHOLHDL  7.5 (H) 11/12/2020   Lab Results  Component Value Date   HGBA1C 8.1 (A) 06/04/2022      Assessment & Plan:   Problem List Items Addressed This Visit       Cardiovascular and Mediastinum   Essential hypertension    We need to be more aggressive with this patient's blood pressure plan will be to take amlodipine 10 mg daily we sent a new prescription to the pharmacy she will begin we also went over low-salt diet and smoking cessation      Relevant Medications   amLODipine (NORVASC) 10 MG tablet     Endocrine   Uncontrolled type 2 diabetes mellitus with hyperglycemia (HCC)    A1c slowly improving we will continue current program and advocate continued healthy diet choices        Musculoskeletal and Integument   Lumbar degenerative disc disease    Plan imaging of the lumbar spine may yet need to go back to orthopedic spine        Other   Bipolar affective disorder in remission ([place])    We will refill Seroquel      Vaccine refused by patient    Patient declined flu vaccine      Chronic midline low back pain without sciatica   Relevant Orders   DG Lumbar Spine Complete   Tobacco user       Current smoking consumption amount: 1 pack a day  Dicsussion on advise to quit smoking and smoking impacts: Cardiovascular impacts  Patient's willingness to quit: Not engaged to quit  Methods to quit smoking discussed: Behavioral modification  Medication management of smoking session drugs discussed: Nicotine replacement  Resources provided:  AVS   Setting quit date not established  Follow-up arranged 2 months   Time spent counseling the patient: 5 minutes       Other Visit Diagnoses     Type 2 diabetes mellitus with hyperglycemia, with long-term current use of insulin ([place])    -  Primary   Relevant Orders   Urine microalbumin-creatinine with uACR   Renal function panel with eGFR   Glucose (CBG) (Completed)   HgB A1c (Completed)      Meds ordered this  encounter  Medications   amLODipine (NORVASC) 10 MG tablet    Sig: Take 1 tablet (10 mg total) by mouth  daily.    Dispense:  90 tablet    Refill:  2   QUEtiapine (SEROQUEL) 400 MG tablet    Sig: Take 1 tablet by mouth twice daily.    Dispense:  60 tablet    Refill:  1    Follow-up: Return in about 1 month (around 07/05/2022) for htn.    Asencion Noble, MD

## 2022-06-04 NOTE — Patient Instructions (Signed)
No change in diabetes medications, your A1C down to 8.1  Start Amlodipine '10mg'$  daily for blood pressure, sent [place] today Urine for albumin , renal blood panel  Continue to reduce tobacco intake  Xray of back obtained downstairs  Return to Dr Joya Gaskins 1 month for blood pressure recheck

## 2022-06-04 NOTE — Assessment & Plan Note (Signed)
We need to be more aggressive with this patient's blood pressure plan will be to take amlodipine 10 mg daily we sent a new prescription to the pharmacy she will begin we also went over low-salt diet and smoking cessation

## 2022-06-04 NOTE — Assessment & Plan Note (Signed)
  .   Current smoking consumption amount: 1 pack a day  Dicsussion on advise to quit smoking and smoking impacts: Cardiovascular impacts  Patient's willingness to quit: Not engaged to quit  Methods to quit smoking discussed: Behavioral modification  Medication management of smoking session drugs discussed: Nicotine replacement  Resources provided:  AVS   Setting quit date not established  Follow-up arranged 2 months   Time spent counseling the patient: 5 minutes  

## 2022-06-04 NOTE — Assessment & Plan Note (Signed)
A1c slowly improving we will continue current program and advocate continued healthy diet choices

## 2022-06-04 NOTE — Assessment & Plan Note (Signed)
Patient declined flu vaccine.

## 2022-06-04 NOTE — Assessment & Plan Note (Signed)
We will refill Seroquel

## 2022-06-04 NOTE — Assessment & Plan Note (Signed)
Plan imaging of the lumbar spine may yet need to go back to orthopedic spine

## 2022-06-06 ENCOUNTER — Other Ambulatory Visit: Payer: Self-pay

## 2022-06-06 LAB — RENAL FUNCTION PANEL
Albumin: 4.6 g/dL (ref 3.9–4.9)
BUN/Creatinine Ratio: 13 (ref 12–28)
BUN: 12 mg/dL (ref 8–27)
CO2: 21 mmol/L (ref 20–29)
Calcium: 9.9 mg/dL (ref 8.7–10.3)
Chloride: 100 mmol/L (ref 96–106)
Creatinine, Ser: 0.96 mg/dL (ref 0.57–1.00)
Glucose: 61 mg/dL — ABNORMAL LOW (ref 70–99)
Phosphorus: 3.5 mg/dL (ref 3.0–4.3)
Potassium: 4.1 mmol/L (ref 3.5–5.2)
Sodium: 139 mmol/L (ref 134–144)
eGFR: 66 mL/min/{1.73_m2} (ref >59)

## 2022-06-06 LAB — MICROALBUMIN / CREATININE URINE RATIO
Creatinine, Urine: 11.7 mg/dL
Microalb/Creat Ratio: <26 mg/g creat (ref 0–29)
Microalbumin, Urine: <3 ug/mL

## 2022-06-09 NOTE — Progress Notes (Signed)
Let pt know urine is normal no kidney damage from diabetes, kidney normal , A1C is improved down to 8.1

## 2022-06-10 ENCOUNTER — Telehealth: Payer: Self-pay

## 2022-06-10 ENCOUNTER — Other Ambulatory Visit: Payer: Self-pay

## 2022-06-10 NOTE — Telephone Encounter (Signed)
Called patient but was unable to leave voicemail. Routing to nurse pool

## 2022-06-10 NOTE — Telephone Encounter (Signed)
-----   Message from Elsie Stain, MD sent at 06/09/2022  2:04 PM EDT ----- Let pt know urine is normal no kidney damage from diabetes, kidney normal , A1C is improved down to 8.1

## 2022-07-02 ENCOUNTER — Other Ambulatory Visit: Payer: Self-pay

## 2022-07-02 NOTE — Progress Notes (Deleted)
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 female who presents for diabetes evaluation, education, and management.  PMH is significant for T2DM, bipolar disorder, HLD, HTN, PVD, stroke, CKD. Patient was referred and last seen by Primary Care Provider, [name], on 04/03/2022. A1c was 9.0 at that time.  At initial visit the the clinical pharmacist on 6/12, patient reported non-adherence to her medications d/t cost. At that time Trulicity was started and Levemir was switched for [employer].   At follow up visit with clinical pharmacist on 8/23, she reported that her home BG readings ar 100-200s, and she no longer sees readings in the 300s. Trulicity dose was increased at that time.   At last visit with PCP on 8/30, her BP was elevated at 158/103. She reported not started her BP medication d/t a high copay. A1c down to 8.1 She has not picked up her amlodipine as of now.   Today, patient arrives in *** good spirits and presents without *** any assistance. ***  Patient reports Diabetes was diagnosed in ***.   Family/Social History: ***  Current diabetes medications include: Trulicity 1.'5mg'$  once a week, glipizide '10mg'$  2 tablets once daily, Basaglar 60 units once daily  Current hypertension medications include: amlodipine '10mg'$  once daily  Current hyperlipidemia medications include: rosuvastatin 40 mg once daily   Patient reports adherence to taking all medications as prescribed.  *** Patient denies adherence with medications, reports missing *** medications *** times per week, on average.  Do you feel that your medications are working for you? {YES NO:22349} Have you been experiencing any side effects to the medications prescribed? {YES NO:22349} Do you have any problems obtaining medications due to transportation or finances? {YES [id number] Insurance coverage: self-pay  Patient {Actions; denies-reports:120008} hypoglycemic events.  Reported home fasting blood sugars: ***   Reported 2 hour post-meal/random blood sugars: ***.  Patient {Actions; denies-reports:120008} nocturia (nighttime urination).  Patient {Actions; denies-reports:120008} neuropathy (nerve pain). Patient {Actions; denies-reports:120008} visual changes. Patient {Actions; denies-reports:120008} self foot exams.   Patient reported dietary habits: Eats *** meals/day Breakfast: *** Lunch: *** Dinner: *** Snacks: *** Drinks: ***  Within the past 12 months, did you worry whether your food would run out before you got money to buy more? {YES NO:22349} Within the past 12 months, did the food you bought run out, and you didn't have money to get more? {YES NO:22349} PHQ-9 Score: ***  Patient-reported exercise habits: ***   O:     Lab Results  Component Value Date   HGBA1C 8.1 (A) 06/04/2022   There were no vitals filed for this visit.  Lipid Panel     Component Value Date/Time   CHOL 314 (H) 11/12/2020 0940   TRIG 386 (H) 11/12/2020 0940   HDL 42 11/12/2020 0940   CHOLHDL 7.5 (H) 11/12/2020 0940   CHOLHDL 10.6 (H) 07/02/2016 1105   VLDL NOT CALC 07/02/2016 1105   LDLCALC 194 (H) 11/12/2020 0940    A/P: Diabetes longstanding *** currently ***. Patient is *** able to verbalize appropriate hypoglycemia management plan. Medication adherence appears ***. Control is suboptimal due to ***. -{Meds adjust:18428} basal insulin *** (insulin ***). Patient will continue to titrate 1 unit every *** days if fasting blood sugar > '100mg'$ /dl until fasting blood sugars reach goal or next visit.  -{Meds adjust:18428} rapid insulin *** (insulin ***) to ***.  -{Meds adjust:18428} GLP-1 *** (generic ***) to ***.  -{Meds adjust:18428} SGLT2-I *** (generic ***)  to ***. Counseled on sick day rules. -{Meds adjust:18428} metformin *** to ***.  -Patient educated on purpose, proper use, and potential adverse effects of ***.  -Extensively discussed pathophysiology of diabetes, recommended lifestyle  interventions, dietary effects on blood sugar control.  -Counseled on s/sx of and management of hypoglycemia.  -Next A1c anticipated ***.   ASCVD risk - primary ***secondary prevention in patient with diabetes. Last LDL is *** not at goal of <70 *** mg/dL. ASCVD risk factors include *** and 10-year ASCVD risk score of ***. {Desc; low/moderate/high:110033} intensity statin indicated.  -{Meds adjust:18428} ***statin *** mg.   Hypertension longstanding *** currently ***. Blood pressure goal of <130/80 *** mmHg. Medication adherence ***. Blood pressure control is suboptimal due to ***. -***  Written patient instructions provided. Patient verbalized understanding of treatment plan.  Total time in face to face counseling *** minutes.    Follow-up:  Pharmacist ***. PCP clinic visit in 10/4  Ashley [name], PharmD PGY-1 [facility] Pharmacy Resident

## 2022-07-03 ENCOUNTER — Ambulatory Visit: Payer: No Typology Code available for payment source | Admitting: Pharmacist

## 2022-07-04 ENCOUNTER — Other Ambulatory Visit: Payer: Self-pay

## 2022-07-04 ENCOUNTER — Ambulatory Visit
Admission: RE | Admit: 2022-07-04 | Discharge: 2022-07-04 | Disposition: A | Discharge status: Home / Self Care | Payer: Self-pay | Source: Ambulatory Visit | Attending: Critical Care Medicine | Admitting: Critical Care Medicine

## 2022-07-04 DIAGNOSIS — M545 Low back pain, unspecified: Secondary | ICD-10-CM

## 2022-07-07 ENCOUNTER — Other Ambulatory Visit: Payer: Self-pay

## 2022-07-07 NOTE — Progress Notes (Signed)
Let pt know xray show new disc disease Lumbar 2-3 and inquire if she would like referral back to back surgeon for eval

## 2022-07-09 ENCOUNTER — Other Ambulatory Visit: Payer: Self-pay

## 2022-07-09 ENCOUNTER — Encounter: Payer: Self-pay | Admitting: Critical Care Medicine

## 2022-07-09 ENCOUNTER — Ambulatory Visit: Payer: Self-pay | Attending: Critical Care Medicine | Admitting: Critical Care Medicine

## 2022-07-09 VITALS — BP 125/85 | HR 88 | Temp 97.1°F | Ht 62.0 in | Wt 145.2 lb

## 2022-07-09 DIAGNOSIS — E1165 Type 2 diabetes mellitus with hyperglycemia: Secondary | ICD-10-CM

## 2022-07-09 DIAGNOSIS — E782 Mixed hyperlipidemia: Secondary | ICD-10-CM

## 2022-07-09 DIAGNOSIS — Z794 Long term (current) use of insulin: Secondary | ICD-10-CM

## 2022-07-09 DIAGNOSIS — M5136 Other intervertebral disc degeneration, lumbar region: Secondary | ICD-10-CM

## 2022-07-09 DIAGNOSIS — I10 Essential (primary) hypertension: Secondary | ICD-10-CM

## 2022-07-09 DIAGNOSIS — N289 Disorder of kidney and ureter, unspecified: Secondary | ICD-10-CM

## 2022-07-09 MED ORDER — BASAGLAR KWIKPEN 100 UNIT/ML ~~LOC~~ SOPN
60.0000 [IU] | PEN_INJECTOR | Freq: Every day | SUBCUTANEOUS | 2 refills | Status: DC
  Filled 2022-07-09 – 2022-08-04 (×2): qty 15, 25d supply, fill #0

## 2022-07-09 MED ORDER — ROSUVASTATIN CALCIUM 40 MG PO TABS
40.0000 mg | ORAL_TABLET | Freq: Every day | ORAL | 1 refills | Status: DC
  Filled 2022-07-09: qty 90, 90d supply, fill #0
  Filled 2022-09-04: qty 30, 30d supply, fill #0

## 2022-07-09 MED ORDER — GLIPIZIDE ER 10 MG PO TB24
20.0000 mg | ORAL_TABLET | Freq: Every day | ORAL | 4 refills | Status: DC
  Filled 2022-07-09 – 2022-09-04 (×2): qty 60, 30d supply, fill #0

## 2022-07-09 MED ORDER — QUETIAPINE FUMARATE 400 MG PO TABS
400.0000 mg | ORAL_TABLET | Freq: Two times a day (BID) | ORAL | 1 refills | Status: DC
Start: 2022-07-09 — End: 2022-09-24
  Filled 2022-07-09: qty 60, fill #0
  Filled 2022-08-04: qty 60, 30d supply, fill #0
  Filled 2022-09-01: qty 60, 30d supply, fill #1

## 2022-07-09 MED ORDER — TIZANIDINE HCL 4 MG PO TABS
ORAL_TABLET | ORAL | 2 refills | Status: DC
  Filled 2022-07-09: qty 30, 5d supply, fill #0

## 2022-07-09 MED ORDER — TRULICITY 1.5 MG/0.5ML ~~LOC~~ SOAJ
1.5000 mg | SUBCUTANEOUS | 3 refills | Status: DC
  Filled 2022-07-09 – 2022-08-05 (×7): qty 2, 28d supply, fill #0

## 2022-07-09 NOTE — Assessment & Plan Note (Signed)
Patient with improved glycemic control no changes made

## 2022-07-09 NOTE — Assessment & Plan Note (Signed)
Assess renal panel 

## 2022-07-09 NOTE — Assessment & Plan Note (Signed)
Hypertension improved at this time we will monitor off medication

## 2022-07-09 NOTE — Patient Instructions (Signed)
Refills on medications sent to our pharmacy  Stop amlodipine  Return to [name] 4 months

## 2022-07-09 NOTE — Assessment & Plan Note (Signed)
Referral back to neurosurgery made 

## 2022-07-09 NOTE — Progress Notes (Signed)
Established Patient Office Visit  Subjective:  Patient ID: Ashley [name], female    DOB: 1961-04-24  Age: 61 y.o. MRN: [id number]  CC:  Chief Complaint  Patient presents with   Hypertension    HPI 08/2022 Ashley [name] presents for primary care follow-up.  Patient with history of type 2 diabetes bipolar disorder hyperlipidemia  The patient is coming up on the first anniversary of the death of her daughter at age 26 of lupus.  She has been very depressed and disengaged around her health.  She had not been taking her insulin product or Glucotrol.  On arrival blood sugar was 435 and A1c was 10.6.  She does complain of numbness in the fingers and right hip pain.  She declines to receive any vaccinations at this time.  She does have a colon cancer screening she states she will consider following through on this.  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 The patient appears to recover from that surgery.  She states the numbness in the fingers were not occurring prior to the cervical surgery.  Patient does not have any other complaints at this time.  Her PHQ 9 and GAD-7 are elevated but she is not suicidal.  She still smokes a pack a day of cigarettes and is not engaged around quitting smoking at this time.  Also she is taking Seroquel on a regular basis but declines to see a provider for her mental health.  01/29/22 Patient returns in follow-up for hypertension on arrival blood pressure 120/80.  Blood sugar 194 A1c 9.8 down point from 10.6.  Not able to buy healthy food does not have Medicaid.  Patient needs to schedule her mammogram.  Has no other complaints at this visit.  She wants to give plasma. Continue glipizide  6/29 Patient seen in return follow-up since the last visit he did see [name] in clinical  pharmacy several weeks ago and now has had Trulicity added to her program and is been switched to Basaglar 60 units daily and discontinuing glipizide daily.  Patient has no real complaints today except that she does not like where she is living she does not get on along well with the individual running her apartment complex.  She is trying to move into a different complex.  Patient still smoking a cigarette pack a day  Blood pressure on arrival was elevated 146/92 Patient's not on any blood pressure medicine she had been normal tensive previously but has slipped gradually back into the hypertensive state owing to the fact that she continues to smoke and is under a great deal of stress.  Patient has no other complaints.  Below is clinical pharmacy visit Saw [name] 03/17/22: Diabetes longstanding currently uncontrolled given last A1c 9.8. Patient is able to verbalize appropriate hypoglycemia management plan. Medication adherence appears to be improving. She seems to be more engaged in improving her health. Due to high cost of Levemir, will switch BID Levemir to daily Basaglar which is free at our pharmacy. Will also start GLP-1 RA with CV and renal benefit given history of stroke and CKD and to better improve glycemic control. Will hopefully be able to decrease glipizide in the future as we increase Trulicity as tolerated to reduce hypoglycemic risk.  -Start Trulicity 3.40 mg weekly.  -Switch Levemir to Basaglar 60  units daily.  -Continue glipizide XL 20 mg daily.  -Patient educated on purpose, proper use, and potential adverse effects of Trulicity.  -Extensively discussed pathophysiology of diabetes, recommended lifestyle interventions, dietary effects on blood sugar control.  -Counseled on s/sx of and management of hypoglycemia.  -Next A1c anticipated July 2023.    8/30 Patient seen in return follow-up she is still smoking about 10 cigarettes daily on arrival blood pressure is elevated 158/103 and  this does not change on a recheck.  Unfortunately she did not pick up and start the amlodipine from the last visit in June.  She states insurance co-pays are challenged for her.  She still complaining of low back pain had previous lumbar surgery would like another x-ray.  Blood sugar on arrival is 154 and A1c is down to 8.1.  She has been compliant with the Trulicity Basaglar and glipizide.  10/4 Patient returns in follow-up and she never filled the amlodipine she went home and changed her diet remove salt from the diet and on arrival today blood pressure is 125/85.  Patient continues to have low back pain and has been previously seen by neurosurgery who performed lumbar spine and neck fusion.  Recent chest x-ray and lumbar spine films showed progression of lumbar disease.  Note patient is now insured with [employer].  She has no other complaints except for her back pain.  Past Medical History:  Diagnosis Date   Anxiety    Arthritis    Bipolar 1 disorder ([place])    Cervical myelopathy ([place]) 12/28/2020   Cervical spinal cord compression ([facility]) 12/19/2020   Depression    Diabetes mellitus without complication ([facility])    Type II   GERD (gastroesophageal reflux disease)    Heart murmur    "nothing to worry about"   HNP (herniated nucleus pulposus) with myelopathy, cervical    HTN (hypertension)    not on medication   Quadriplegia and quadriparesis ([place])    Stroke ([place]) 11/2020    Past Surgical History:  Procedure Laterality Date   ABDOMINAL HYSTERECTOMY     ANTERIOR CERVICAL DECOMP/DISCECTOMY FUSION N/A 12/25/2020   Procedure: CERVICAL THREE-FOUR ANTERIOR CERVICAL DECOMPRESSION/DISCECTOMY FUSION;  Surgeon: Judith Part, MD;  Location: [facility];  Service: Neurosurgery;  Laterality: N/A;  CERVICAL THREE-FOUR ANTERIOR CERVICAL DECOMPRESSION/DISCECTOMY FUSION    LUMBAR LAMINECTOMY/DECOMPRESSION MICRODISCECTOMY Left 04/07/2016   Procedure: Left Lumbar four-five Microdiskectomy;  Surgeon: Erline Levine, MD;   Location: [facility];  Service: Neurosurgery;  Laterality: Left;    Family History  Problem Relation Age of Onset   Healthy Mother    Diabetes Mellitus II Father    Breast cancer Sister    Diabetes Brother    Heart disease Neg Hx    Stroke Neg Hx    Kidney disease Neg Hx     Social History   Socioeconomic History   Marital status: Divorced    Spouse name: Not on file   Number of children: 3   Years of education: Not on file   Highest education level: High school graduate  Occupational History   Not on file  Tobacco Use   Smoking status: Every Day    Packs/day: 1.00    Years: 39.00    Total pack years: 39.00    Types: Cigarettes   Smokeless tobacco: Never  Vaping Use   Vaping Use: Never used  Substance and Sexual Activity   Alcohol use: No   Drug use: Yes    Types: Marijuana  Comment: Cocaine -- 04/06/16- last time   Sexual activity: Yes    Birth control/protection: Surgical  Other Topics Concern   Not on file  Social History Narrative   Lives with daughter   Right Handed   Drinks 1 cup of coffee per day      Social Determinants of Health   Financial Resource Strain: Not on file  Food Insecurity: Not on file  Transportation Needs: Not on file  Physical Activity: Not on file  Stress: Not on file  Social Connections: Not on file  Intimate Partner Violence: Not on file    Outpatient Medications Prior to Visit  Medication Sig Dispense Refill   acetaminophen (TYLENOL) 325 MG tablet Take 1-2 tablets (325-650 mg total) by mouth every 4 (four) hours as needed for mild pain. 60 tablet 1   ascorbic acid (VITAMIN C) 500 MG tablet Take 1 tablet (500 mg total) by mouth daily.     Blood Glucose Monitoring Suppl (TRUE METRIX METER) w/Device KIT Use to check blood sugar up to 3 times daily. 1 kit 0   glucose blood (TRUE METRIX BLOOD GLUCOSE TEST) test strip Use to check blood sugar up to 3 times daily. 100 each 2   Insulin Pen Needle (TECHLITE PEN NEEDLES) 32G X 4  MM MISC Use as directed. 100 each 3   Multiple Vitamin (MULTIVITAMIN WITH MINERALS) TABS tablet Take 1 tablet by mouth daily.     TRUEplus Lancets 28G MISC Use to check blood sugar up to 3 times daily. 100 each 3   amLODipine (NORVASC) 10 MG tablet Take 1 tablet (10 mg total) by mouth daily. 90 tablet 2   Dulaglutide (TRULICITY) 1.5 UY/4.0HK SOPN Inject 1.5 mg into the skin once a week. 2 mL 3   glipiZIDE (GLUCOTROL XL) 10 MG 24 hr tablet Take 2 tablets (20 mg total) by mouth once daily. 60 tablet 4   Insulin Glargine (BASAGLAR KWIKPEN) 100 UNIT/ML Inject 60 Units into the skin once daily. 15 mL 2   QUEtiapine (SEROQUEL) 400 MG tablet Take 1 tablet by mouth twice daily. 60 tablet 1   rosuvastatin (CRESTOR) 40 MG tablet Take 1 tablet (40 mg total) by mouth once daily. 90 tablet 1   tiZANidine (ZANAFLEX) 4 MG tablet Take 2 tablet(s) by mouth 1 to 3 times daily as needed. 30 tablet 2   No facility-administered medications prior to visit.    No Known Allergies  ROS Review of Systems  Constitutional:  Negative for chills, diaphoresis and fever.  HENT:  Negative for congestion, hearing loss, nosebleeds, sore throat and tinnitus.   Eyes:  Negative for photophobia and redness.  Respiratory:  Negative for cough, shortness of breath, wheezing and stridor.   Cardiovascular:  Negative for chest pain, palpitations and leg swelling.  Gastrointestinal:  Negative for abdominal pain, blood in stool, constipation, diarrhea, nausea and vomiting.  Endocrine: Negative for polydipsia.  Genitourinary:  Negative for dysuria, flank pain, frequency, hematuria and urgency.  Musculoskeletal:  Positive for back pain. Negative for myalgias and neck pain.  Skin:  Negative for rash.  Allergic/Immunologic: Negative for environmental allergies.  Neurological:  Positive for numbness. Negative for dizziness, tremors, seizures, weakness and headaches.  Hematological:  Does not bruise/bleed easily.  Psychiatric/Behavioral:   Negative for suicidal ideas. The patient is not nervous/anxious.       Objective:    Physical Exam Vitals reviewed.  Constitutional:      Appearance: Normal appearance. She is well-developed and normal weight. She  is not diaphoretic.  HENT:     Head: Normocephalic and atraumatic.     Nose: No nasal deformity, septal deviation, mucosal edema or rhinorrhea.     Right Sinus: No maxillary sinus tenderness or frontal sinus tenderness.     Left Sinus: No maxillary sinus tenderness or frontal sinus tenderness.     Mouth/Throat:     Pharynx: No oropharyngeal exudate.  Eyes:     General: No scleral icterus.    Conjunctiva/sclera: Conjunctivae normal.     Pupils: Pupils are equal, round, and reactive to light.  Neck:     Thyroid: No thyromegaly.     Vascular: No carotid bruit or JVD.     Trachea: Trachea normal. No tracheal tenderness or tracheal deviation.  Cardiovascular:     Rate and Rhythm: Normal rate and regular rhythm.     Chest Wall: PMI is not displaced.     Pulses: Normal pulses. No decreased pulses.     Heart sounds: Normal heart sounds, S1 normal and S2 normal. Heart sounds not distant. No murmur heard.    No systolic murmur is present.     No diastolic murmur is present.     No friction rub. No gallop. No S3 or S4 sounds.  Pulmonary:     Effort: No tachypnea, accessory muscle usage or respiratory distress.     Breath sounds: No stridor. No decreased breath sounds, wheezing, rhonchi or rales.  Chest:     Chest wall: No tenderness.  Abdominal:     General: Bowel sounds are normal. There is no distension.     Palpations: Abdomen is soft. Abdomen is not rigid.     Tenderness: There is no abdominal tenderness. There is no guarding or rebound.  Musculoskeletal:        General: Tenderness present. Normal range of motion.     Cervical back: Normal range of motion and neck supple. No edema, erythema or rigidity. No muscular tenderness. Normal range of motion.     Comments:  Tender lower lumbar spine area  Lymphadenopathy:     Head:     Right side of head: No submental or submandibular adenopathy.     Left side of head: No submental or submandibular adenopathy.     Cervical: No cervical adenopathy.  Skin:    General: Skin is warm and dry.     Coloration: Skin is not pale.     Findings: No rash.     Nails: There is no clubbing.  Neurological:     Mental Status: She is alert and oriented to person, place, and time.     Sensory: No sensory deficit.  Psychiatric:        Attention and Perception: Attention normal.        Mood and Affect: Mood is depressed.        Speech: Speech normal.        Behavior: Behavior normal. Behavior is cooperative.        Thought Content: Thought content does not include homicidal or suicidal ideation.        Cognition and Memory: Cognition and memory normal.        Judgment: Judgment normal.     BP 125/85   Pulse 88   Temp (!) 97.1 F (36.2 C) (Oral)   Ht _0  (1.575 m)   Wt 145 lb 3.2 oz (65.9 kg)   SpO2 100%   BMI 26.56 kg/m  Wt Readings from Last 3 Encounters:  07/09/22 145  lb 3.2 oz (65.9 kg)  06/04/22 149 lb (67.6 kg)  04/03/22 141 lb 12.8 oz (64.3 kg)     Health Maintenance Due  Topic Date Due   OPHTHALMOLOGY EXAM  11/30/2020    There are no preventive care reminders to display for this patient.  Lab Results  Component Value Date   TSH 0.970 11/12/2020   Lab Results  Component Value Date   WBC 10.3 04/11/2021   HGB 13.6 04/11/2021   HCT 43.2 04/11/2021   MCV 92 04/11/2021   PLT 449 04/11/2021   Lab Results  Component Value Date   NA 139 06/04/2022   K 4.1 06/04/2022   CO2 21 06/04/2022   GLUCOSE 61 (L) 06/04/2022   BUN 12 06/04/2022   CREATININE 0.96 06/04/2022   BILITOT <0.2 04/19/2021   ALKPHOS 164 (H) 04/19/2021   AST 42 (H) 04/19/2021   ALT 60 (H) 12/29/2020   PROT 7.3 04/19/2021   ALBUMIN 4.6 06/04/2022   CALCIUM 9.9 06/04/2022   ANIONGAP 7 12/31/2020   EGFR 66 06/04/2022    Lab Results  Component Value Date   CHOL 314 (H) 11/12/2020   Lab Results  Component Value Date   HDL 42 11/12/2020   Lab Results  Component Value Date   LDLCALC 194 (H) 11/12/2020   Lab Results  Component Value Date   TRIG 386 (H) 11/12/2020   Lab Results  Component Value Date   CHOLHDL 7.5 (H) 11/12/2020   Lab Results  Component Value Date   HGBA1C 8.1 (A) 06/04/2022      Assessment & Plan:   Problem List Items Addressed This Visit       Cardiovascular and Mediastinum   Essential hypertension    Hypertension improved at this time we will monitor off medication      Relevant Medications   rosuvastatin (CRESTOR) 40 MG tablet     Endocrine   Uncontrolled type 2 diabetes mellitus with hyperglycemia ([place])    Patient with improved glycemic control no changes made      Relevant Medications   Dulaglutide (TRULICITY) 1.5 XI/3.3AS SOPN   glipiZIDE (GLUCOTROL XL) 10 MG 24 hr tablet   Insulin Glargine (BASAGLAR KWIKPEN) 100 UNIT/ML   rosuvastatin (CRESTOR) 40 MG tablet     Musculoskeletal and Integument   Lumbar degenerative disc disease - Primary    Referral back to neurosurgery made      Relevant Medications   tiZANidine (ZANAFLEX) 4 MG tablet   Other Relevant Orders   Ambulatory referral to Neurosurgery     Genitourinary   Renal insufficiency    Assess renal panel        Other   Mixed dyslipidemia   Relevant Medications   rosuvastatin (CRESTOR) 40 MG tablet   Other Visit Diagnoses     Type 2 diabetes mellitus with hyperglycemia, with long-term current use of insulin ([facility])       Relevant Medications   Dulaglutide (TRULICITY) 1.5 NK/5.3ZJ SOPN   glipiZIDE (GLUCOTROL XL) 10 MG 24 hr tablet   Insulin Glargine (BASAGLAR KWIKPEN) 100 UNIT/ML   rosuvastatin (CRESTOR) 40 MG tablet      Meds ordered this encounter  Medications   Dulaglutide (TRULICITY) 1.5 QB/3.4LP SOPN    Sig: Inject 1.5 mg into the skin once a week.    Dispense:  2 mL     Refill:  3   glipiZIDE (GLUCOTROL XL) 10 MG 24 hr tablet    Sig: Take 2 tablets (20 mg total)  by mouth once daily.    Dispense:  60 tablet    Refill:  4   Insulin Glargine (BASAGLAR KWIKPEN) 100 UNIT/ML    Sig: Inject 60 Units into the skin once daily.    Dispense:  15 mL    Refill:  2   rosuvastatin (CRESTOR) 40 MG tablet    Sig: Take 1 tablet (40 mg total) by mouth once daily.    Dispense:  90 tablet    Refill:  1   QUEtiapine (SEROQUEL) 400 MG tablet    Sig: Take 1 tablet (400 mg total) by mouth 2 (two) times daily.    Dispense:  60 tablet    Refill:  1   tiZANidine (ZANAFLEX) 4 MG tablet    Sig: Take 2 tablet(s) by mouth 1to 3 times daily as needed.    Dispense:  30 tablet    Refill:  2    Follow-up: Return in about 4 months (around 11/09/2022) for htn.    Asencion Noble, MD

## 2022-07-09 NOTE — Progress Notes (Signed)
No major concerns to discuss per pt.  Pt requesting RF on all Rx's.

## 2022-07-11 ENCOUNTER — Telehealth: Payer: Self-pay

## 2022-07-11 NOTE — Telephone Encounter (Signed)
-----   Message from Elsie Stain, MD sent at 07/07/2022  1:23 PM EDT ----- Let pt know xray show new disc disease Lumbar 2-3 and inquire if she would like referral back to back surgeon for eval

## 2022-07-11 NOTE — Telephone Encounter (Signed)
Pt was called and vm was left, Information has been sent to nurse pool.   

## 2022-07-15 ENCOUNTER — Other Ambulatory Visit: Payer: Self-pay

## 2022-07-18 ENCOUNTER — Ambulatory Visit: Payer: Self-pay | Admitting: *Deleted

## 2022-07-18 NOTE — Telephone Encounter (Signed)
Pt called in for message however the phone line disconnected.    I called her back.   I read her the message from Leamon Arnt, [place] dated 07/11/2022 at 2:40.  She does want a referral to a back surgeon.   She is still having a lot of pain. Reason for Disposition  [1] Follow-up call to recent contact AND [2] information only call, no triage required  Answer Assessment - Initial Assessment Questions 1. REASON FOR CALL or QUESTION: "What is your reason for calling today?" or "How can I best help you?" or "What question do you have that I can help answer?"     Read message to her regarding back x ray and referral to surgeon wanted.   Yes she does want a referral to a back surgeon.   Sent information to [facility].  Protocols used: Information Only Call - No Triage-A-AH

## 2022-07-31 ENCOUNTER — Other Ambulatory Visit: Payer: Self-pay

## 2022-08-04 ENCOUNTER — Other Ambulatory Visit: Payer: Self-pay

## 2022-08-05 ENCOUNTER — Ambulatory Visit: Payer: 59 | Attending: Nurse Practitioner | Admitting: Pharmacist

## 2022-08-05 ENCOUNTER — Other Ambulatory Visit: Payer: Self-pay

## 2022-08-05 ENCOUNTER — Encounter: Payer: Self-pay | Admitting: Pharmacist

## 2022-08-05 DIAGNOSIS — Z794 Long term (current) use of insulin: Secondary | ICD-10-CM | POA: Diagnosis not present

## 2022-08-05 DIAGNOSIS — E1165 Type 2 diabetes mellitus with hyperglycemia: Secondary | ICD-10-CM | POA: Diagnosis not present

## 2022-08-05 MED ORDER — NOVOLOG FLEXPEN 100 UNIT/ML ~~LOC~~ SOPN
5.0000 [IU] | PEN_INJECTOR | Freq: Three times a day (TID) | SUBCUTANEOUS | 1 refills | Status: DC
  Filled 2022-08-05: qty 3, 20d supply, fill #0

## 2022-08-05 NOTE — Progress Notes (Signed)
   S:    Ashley [name] is a 61 y.o. female who presents for diabetes evaluation, education, and management. PMH is significant for T2DM, bipolar disorder, HLD, HTN, PVD, stroke, CKD. Patient was referred and last seen by Primary Care Provider, [name], on 07/09/2022.   Today, patient arrives in good spirits and presents without any assistance.   Family/Social History:  -Fhx: T2DM -Tobacco: current smoker -Alcohol: none reported   Current diabetes medications include: Basaglar 60 units daily, glipizide XL 20 mg daily, Trulicity 1.5 mg weekly  Patient reports taking Basaglar and glipizide, but she has not received the Trulicity. This is ~$600 with her insurance + a copay card as she has ~$3,000 remaining on an unmet deductible.   Insurance coverage: [employer]   Patient denies hypoglycemic events.  Reported home fasting blood sugars: 100s-200s. Tells me she no longer sees BG > 300. No meter with her today.   Patient denies nocturia (nighttime urination).  Patient reports neuropathy (nerve pain). Patient denies visual changes. Patient reports self foot exams.   Patient reported dietary habits: Eats 2 meals/day + snacks  Patient-reported exercise habits: none currently  O:   Lab Results  Component Value Date   HGBA1C 8.1 (A) 06/04/2022   There were no vitals filed for this visit.  Lipid Panel     Component Value Date/Time   CHOL 314 (H) 11/12/2020 0940   TRIG 386 (H) 11/12/2020 0940   HDL 42 11/12/2020 0940   CHOLHDL 7.5 (H) 11/12/2020 0940   CHOLHDL 10.6 (H) 07/02/2016 1105   VLDL NOT CALC 07/02/2016 1105   LDLCALC 194 (H) 11/12/2020 0940   Clinical Atherosclerotic Cardiovascular Disease (ASCVD): Yes  The ASCVD Risk score (Arnett DK, et al., 2019) failed to calculate for the following reasons:   The patient has a prior MI or stroke diagnosis   A/P: Diabetes longstanding currently uncontrolled given last A1c of 8.1 but this is improved. Patient is able to verbalize  appropriate hypoglycemia management plan. Medication adherence is suboptimal. Unfortunately, we will have to stop Ozempic due to insurance. Per insurance, all GLP-1 RA either require PA or are too expensive. Will add low-dose mealtime insulin to replace Trulicity for now.  -Discontinue Trulicity.  -Continue Basaglar 60 units daily.  -Continue glipizide XL 20 mg daily.  -Start Novolog 5u TID with meals.  -Extensively discussed pathophysiology of diabetes, recommended lifestyle interventions, dietary effects on blood sugar control.  -Counseled on s/sx of and management of hypoglycemia.  -Next A1c anticipated 11/23.   Written patient instructions provided. Patient verbalized understanding of treatment plan. Total time in face to face counseling 30 minutes.    Follow up with me in December for A1c.  Benard Halsted, PharmD, [facility], [place] [phone]

## 2022-08-07 ENCOUNTER — Other Ambulatory Visit: Payer: Self-pay

## 2022-09-01 ENCOUNTER — Other Ambulatory Visit: Payer: Self-pay

## 2022-09-04 ENCOUNTER — Other Ambulatory Visit: Payer: Self-pay

## 2022-09-05 ENCOUNTER — Other Ambulatory Visit: Payer: Self-pay

## 2022-09-12 ENCOUNTER — Ambulatory Visit: Payer: 59 | Admitting: Pharmacist

## 2022-09-24 ENCOUNTER — Other Ambulatory Visit: Payer: Self-pay

## 2022-09-24 ENCOUNTER — Other Ambulatory Visit: Payer: Self-pay | Admitting: Critical Care Medicine

## 2022-09-24 MED ORDER — QUETIAPINE FUMARATE 400 MG PO TABS
400.0000 mg | ORAL_TABLET | Freq: Two times a day (BID) | ORAL | 1 refills | Status: DC
  Filled 2022-09-24 – 2022-10-24 (×4): qty 60, 30d supply, fill #0

## 2022-09-25 ENCOUNTER — Other Ambulatory Visit: Payer: Self-pay

## 2022-09-30 ENCOUNTER — Other Ambulatory Visit: Payer: Self-pay

## 2022-10-01 ENCOUNTER — Other Ambulatory Visit: Payer: Self-pay

## 2022-10-23 ENCOUNTER — Other Ambulatory Visit: Payer: Self-pay

## 2022-10-24 ENCOUNTER — Other Ambulatory Visit: Payer: Self-pay

## 2022-10-31 ENCOUNTER — Ambulatory Visit: Payer: 59 | Attending: Critical Care Medicine | Admitting: Pharmacist

## 2022-10-31 ENCOUNTER — Other Ambulatory Visit: Payer: Self-pay

## 2022-10-31 DIAGNOSIS — Z794 Long term (current) use of insulin: Secondary | ICD-10-CM

## 2022-10-31 DIAGNOSIS — E1165 Type 2 diabetes mellitus with hyperglycemia: Secondary | ICD-10-CM | POA: Diagnosis not present

## 2022-10-31 DIAGNOSIS — E782 Mixed hyperlipidemia: Secondary | ICD-10-CM

## 2022-10-31 MED ORDER — BASAGLAR KWIKPEN 100 UNIT/ML ~~LOC~~ SOPN
60.0000 [IU] | PEN_INJECTOR | Freq: Every day | SUBCUTANEOUS | 2 refills | Status: DC
  Filled 2022-10-31: qty 15, 25d supply, fill #0

## 2022-10-31 MED ORDER — QUETIAPINE FUMARATE 400 MG PO TABS
400.0000 mg | ORAL_TABLET | Freq: Two times a day (BID) | ORAL | 1 refills | Status: DC
  Filled 2022-10-31: qty 60, 30d supply, fill #0

## 2022-10-31 MED ORDER — NOVOLOG FLEXPEN 100 UNIT/ML ~~LOC~~ SOPN
5.0000 [IU] | PEN_INJECTOR | Freq: Three times a day (TID) | SUBCUTANEOUS | 1 refills | Status: DC
  Filled 2022-10-31: qty 3, 20d supply, fill #0

## 2022-10-31 MED ORDER — TIZANIDINE HCL 4 MG PO TABS
8.0000 mg | ORAL_TABLET | Freq: Three times a day (TID) | ORAL | 2 refills | Status: DC
  Filled 2022-10-31 – 2022-11-18 (×2): qty 30, 5d supply, fill #0
  Filled 2022-12-17: qty 30, 5d supply, fill #1
  Filled 2023-01-12: qty 30, 5d supply, fill #2

## 2022-10-31 MED ORDER — TRULICITY 0.75 MG/0.5ML ~~LOC~~ SOAJ
0.7500 mg | SUBCUTANEOUS | 1 refills | Status: DC
  Filled 2022-10-31 – 2022-12-18 (×2): qty 2, 28d supply, fill #0

## 2022-10-31 MED ORDER — TECHLITE PEN NEEDLES 32G X 4 MM MISC
3 refills | Status: DC
  Filled 2022-10-31 – 2022-12-17 (×2): qty 100, 25d supply, fill #0

## 2022-10-31 NOTE — Progress Notes (Signed)
   S:    Ashley [name] is a 62 y.o. female who presents for diabetes evaluation, education, and management. PMH is significant for T2DM, bipolar disorder, HLD, HTN, PVD, stroke, CKD. Patient was referred and last seen by Primary Care Provider, [name], on 07/09/2022. I saw her on 08/05/2022. Unfortunately, she did not follow-up as instructed.   Today, patient arrives in poor spirits and presents without any assistance. She is not feeling good today, specifically, she tells me she feels nauseous. Denies any abdominal pain, vomiting, blood stools. She tells me this is "normal" for her. Additionally, she tells me that a lot has been going on since our last visit. Her granddaughter passed in November. She's been without most of her medications since then. She tells me "she just hasn't been treating her body right". Denies any current depressive symptoms. No suicidal intent or plan. No homicidal intent or plan. She tells me she is motivated to get back on the right track.   Family/Social History:  -Fhx: T2DM -Tobacco: current smoker -Alcohol: none reported   Current diabetes medications include: Basaglar 60 units daily, glipizide XL 20 mg daily, Novolog 5u TID.  She denies adherence to her medications.   Insurance coverage: [employer]na   Patient denies hypoglycemic events.  Reported home fasting blood sugars: 200s at home. No meter with her today.  Patient denies nocturia (nighttime urination).  Patient reports neuropathy (nerve pain). Patient denies visual changes. Patient reports self foot exams.   Patient reported dietary habits: Eats 2 meals/day + snacks  Patient-reported exercise habits: none currently  O:   Lab Results  Component Value Date   HGBA1C 8.1 (A) 06/04/2022   There were no vitals filed for this visit.  Lipid Panel     Component Value Date/Time   CHOL 314 (H) 11/12/2020 0940   TRIG 386 (H) 11/12/2020 0940   HDL 42 11/12/2020 0940   CHOLHDL 7.5 (H) 11/12/2020 0940    CHOLHDL 10.6 (H) 07/02/2016 1105   VLDL NOT CALC 07/02/2016 1105   LDLCALC 194 (H) 11/12/2020 0940   Clinical Atherosclerotic Cardiovascular Disease (ASCVD): Yes  The ASCVD Risk score (Arnett DK, et al., 2019) failed to calculate for the following reasons:   The patient has a prior MI or stroke diagnosis   A/P: Diabetes longstanding currently uncontrolled given last A1c of 8.1. She does not wish to give bloodwork today as she does not feel good. Patient is able to verbalize appropriate hypoglycemia management plan. Medication adherence is suboptimal. .  -Restart Trulicity. We will see if her copay is not cost prohibitive.   -Restart Basaglar 60 units daily.  -Restart glipizide XL 20 mg daily.  -Restart Novolog 5u TID with meals.  -Extensively discussed pathophysiology of diabetes, recommended lifestyle interventions, dietary effects on blood sugar control.  -Counseled on s/sx of and management of hypoglycemia.  -Next A1c anticipated with PCP in a couple of weeks.    **Of note, I reviewed our pharmacy records after her visit today. Unfortunately, she only picked up the tizanidine and Seroquel. She did not pick up her insulins or Trulicity. I worry that non-adherence is going to continue to be an issue for her.   Written patient instructions provided. Patient verbalized understanding of treatment plan. Total time in face to face counseling 30 minutes.   Follow-up: [name] 11/11/2022  Benard Halsted, PharmD, [facility], [place] [phone]

## 2022-11-06 ENCOUNTER — Other Ambulatory Visit: Payer: Self-pay

## 2022-11-08 NOTE — Progress Notes (Unsigned)
Established Patient Office Visit  Subjective:  Patient ID: Ashley [name], female    DOB: Jul 18, 1961  Age: 62 y.o. MRN: [id number]  CC:  No chief complaint on file.   HPI 08/2022 [name] presents for primary care follow-up.  Patient with history of type 2 diabetes bipolar disorder hyperlipidemia  The patient is coming up on the first anniversary of the death of her daughter at age 59 of lupus.  She has been very depressed and disengaged around her health.  She had not been taking her insulin product or Glucotrol.  On arrival blood sugar was 435 and A1c was 10.6.  She does complain of numbness in the fingers and right hip pain.  She declines to receive any vaccinations at this time.  She does have a colon cancer screening she states she will consider following through on this.  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 The patient appears to recover from that surgery.  She states the numbness in the fingers were not occurring prior to the cervical surgery.  Patient does not have any other complaints at this time.  Her PHQ 9 and GAD-7 are elevated but she is not suicidal.  She still smokes a pack a day of cigarettes and is not engaged around quitting smoking at this time.  Also she is taking Seroquel on a regular basis but declines to see a provider for her mental health.  01/29/22 Patient returns in follow-up for hypertension on arrival blood pressure 120/80.  Blood sugar 194 A1c 9.8 down point from 10.6.  Not able to buy healthy food does not have Medicaid.  Patient needs to schedule her mammogram.  Has no other complaints at this visit.  She wants to give plasma. Continue glipizide  6/29 Patient seen in return follow-up since the last visit he did see [name] in clinical pharmacy several weeks ago and  now has had Trulicity added to her program and is been switched to Basaglar 60 units daily and discontinuing glipizide daily.  Patient has no real complaints today except that she does not like where she is living she does not get on along well with the individual running her apartment complex.  She is trying to move into a different complex.  Patient still smoking a cigarette pack a day  Blood pressure on arrival was elevated 146/92 Patient's not on any blood pressure medicine she had been normal tensive previously but has slipped gradually back into the hypertensive state owing to the fact that she continues to smoke and is under a great deal of stress.  Patient has no other complaints.  Below is clinical pharmacy visit Saw [name] 03/17/22: Diabetes longstanding currently uncontrolled given last A1c 9.8. Patient is able to verbalize appropriate hypoglycemia management plan. Medication adherence appears to be improving. She seems to be more engaged in improving her health. Due to high cost of Levemir, will switch BID Levemir to daily Basaglar which is free at our pharmacy. Will also start GLP-1 RA with CV and renal benefit given history of stroke and CKD and to better improve glycemic control. Will hopefully be able to decrease glipizide in the future as we increase Trulicity as tolerated to reduce hypoglycemic risk.  -Start Trulicity 4.48 mg weekly.  -Switch Levemir to Basaglar 60 units daily.  -Continue glipizide  XL 20 mg daily.  -Patient educated on purpose, proper use, and potential adverse effects of Trulicity.  -Extensively discussed pathophysiology of diabetes, recommended lifestyle interventions, dietary effects on blood sugar control.  -Counseled on s/sx of and management of hypoglycemia.  -Next A1c anticipated July 2023.    8/30 Patient seen in return follow-up she is still smoking about 10 cigarettes daily on arrival blood pressure is elevated 158/103 and this does not change on a  recheck.  Unfortunately she did not pick up and start the amlodipine from the last visit in June.  She states insurance co-pays are challenged for her.  She still complaining of low back pain had previous lumbar surgery would like another x-ray.  Blood sugar on arrival is 154 and A1c is down to 8.1.  She has been compliant with the Trulicity Basaglar and glipizide.  10/4 Patient returns in follow-up and she never filled the amlodipine she went home and changed her diet remove salt from the diet and on arrival today blood pressure is 125/85.  Patient continues to have low back pain and has been previously seen by neurosurgery who performed lumbar spine and neck fusion.  Recent chest x-ray and lumbar spine films showed progression of lumbar disease.  Note patient is now insured with [employer].  She has no other complaints except for her back pain.  11/11/22 Expand All Collapse All    S:    Ashley [name] is a 62 y.o. female who presents for diabetes evaluation, education, and management. PMH is significant for T2DM, bipolar disorder, HLD, HTN, PVD, stroke, CKD. Patient was referred and last seen by Primary Care Provider, [name], on 07/09/2022. I saw her on 08/05/2022. Unfortunately, she did not follow-up as instructed.    Today, patient arrives in poor spirits and presents without any assistance. She is not feeling good today, specifically, she tells me she feels nauseous. Denies any abdominal pain, vomiting, blood stools. She tells me this is "normal" for her. Additionally, she tells me that a lot has been going on since our last visit. Her granddaughter passed in November. She's been without most of her medications since then. She tells me "she just hasn't been treating her body right". Denies any current depressive symptoms. No suicidal intent or plan. No homicidal intent or plan. She tells me she is motivated to get back on the right track.  Diabetes longstanding currently uncontrolled given last A1c  of 8.1. She does not wish to give bloodwork today as she does not feel good. Patient is able to verbalize appropriate hypoglycemia management plan. Medication adherence is suboptimal. .  -Restart Trulicity. We will see if her copay is not cost prohibitive.   -Restart Basaglar 60 units daily.  -Restart glipizide XL 20 mg daily.  -Restart Novolog 5u TID with meals.      Past Medical History:  Diagnosis Date   Anxiety    Arthritis    Bipolar 1 disorder ([place])    Cervical myelopathy ([facility]) 12/28/2020   Cervical spinal cord compression ([facility]) 12/19/2020   Depression    Diabetes mellitus without complication ([facility])    Type II   GERD (gastroesophageal reflux disease)    Heart murmur    "nothing to worry about"   HNP (herniated nucleus pulposus) with myelopathy, cervical    HTN (hypertension)    not on medication   Quadriplegia and quadriparesis ([facility])    Stroke ([facility]) 11/2020    Past Surgical History:  Procedure Laterality Date  ABDOMINAL HYSTERECTOMY     ANTERIOR CERVICAL DECOMP/DISCECTOMY FUSION N/A 12/25/2020   Procedure: CERVICAL THREE-FOUR ANTERIOR CERVICAL DECOMPRESSION/DISCECTOMY FUSION;  Surgeon: Judith Part, MD;  Location: [facility];  Service: Neurosurgery;  Laterality: N/A;  CERVICAL THREE-FOUR ANTERIOR CERVICAL DECOMPRESSION/DISCECTOMY FUSION    LUMBAR LAMINECTOMY/DECOMPRESSION MICRODISCECTOMY Left 04/07/2016   Procedure: Left Lumbar four-five Microdiskectomy;  Surgeon: Erline Levine, MD;  Location: [facility];  Service: Neurosurgery;  Laterality: Left;    Family History  Problem Relation Age of Onset   Healthy Mother    Diabetes Mellitus II Father    Breast cancer Sister    Diabetes Brother    Heart disease Neg Hx    Stroke Neg Hx    Kidney disease Neg Hx     Social History   Socioeconomic History   Marital status: Divorced    Spouse name: Not on file   Number of children: 3   Years of education: Not on file   Highest education level: High school graduate   Occupational History   Not on file  Tobacco Use   Smoking status: Every Day    Packs/day: 1.00    Years: 39.00    Total pack years: 39.00    Types: Cigarettes   Smokeless tobacco: Never  Vaping Use   Vaping Use: Never used  Substance and Sexual Activity   Alcohol use: No   Drug use: Yes    Types: Marijuana    Comment: Cocaine -- 04/06/16- last time   Sexual activity: Yes    Birth control/protection: Surgical  Other Topics Concern   Not on file  Social History Narrative   Lives with daughter   Right Handed   Drinks 1 cup of coffee per day      Social Determinants of Health   Financial Resource Strain: Not on file  Food Insecurity: Not on file  Transportation Needs: Not on file  Physical Activity: Not on file  Stress: Not on file  Social Connections: Not on file  Intimate Partner Violence: Not on file    Outpatient Medications Prior to Visit  Medication Sig Dispense Refill   acetaminophen (TYLENOL) 325 MG tablet Take 1-2 tablets (325-650 mg total) by mouth every 4 (four) hours as needed for mild pain. 60 tablet 1   ascorbic acid (VITAMIN C) 500 MG tablet Take 1 tablet (500 mg total) by mouth daily.     Blood Glucose Monitoring Suppl (TRUE METRIX METER) w/Device KIT Use to check blood sugar up to 3 times daily. 1 kit 0   Dulaglutide (TRULICITY) 2.69 SW/5.4OE SOPN Inject 0.75 mg into the skin once a week. 2 mL 1   glipiZIDE (GLUCOTROL XL) 10 MG 24 hr tablet Take 2 tablets (20 mg total) by mouth once daily. 60 tablet 4   glucose blood (TRUE METRIX BLOOD GLUCOSE TEST) test strip Use to check blood sugar up to 3 times daily. 100 each 2   insulin aspart (NOVOLOG FLEXPEN) 100 UNIT/ML FlexPen Inject 5 Units into the skin 3 (three) times daily with meals. 15 mL 1   Insulin Glargine (BASAGLAR KWIKPEN) 100 UNIT/ML Inject 60 Units into the skin once daily. 15 mL 2   Insulin Pen Needle (TECHLITE PEN NEEDLES) 32G X 4 MM MISC Use as directed. 100 each 3   Multiple Vitamin (MULTIVITAMIN  WITH MINERALS) TABS tablet Take 1 tablet by mouth daily.     QUEtiapine (SEROQUEL) 400 MG tablet Take 1 tablet (400 mg total) by mouth 2 (two) times daily.  60 tablet 1   rosuvastatin (CRESTOR) 40 MG tablet Take 1 tablet (40 mg total) by mouth once daily. 90 tablet 1   tiZANidine (ZANAFLEX) 4 MG tablet Take 2 tablet(s) by mouth 1 to 3  times daily as needed. 30 tablet 2   TRUEplus Lancets 28G MISC Use to check blood sugar up to 3 times daily. 100 each 3   No facility-administered medications prior to visit.    No Known Allergies  ROS Review of Systems  Constitutional:  Negative for chills, diaphoresis and fever.  HENT:  Negative for congestion, hearing loss, nosebleeds, sore throat and tinnitus.   Eyes:  Negative for photophobia and redness.  Respiratory:  Negative for cough, shortness of breath, wheezing and stridor.   Cardiovascular:  Negative for chest pain, palpitations and leg swelling.  Gastrointestinal:  Negative for abdominal pain, blood in stool, constipation, diarrhea, nausea and vomiting.  Endocrine: Negative for polydipsia.  Genitourinary:  Negative for dysuria, flank pain, frequency, hematuria and urgency.  Musculoskeletal:  Positive for back pain. Negative for myalgias and neck pain.  Skin:  Negative for rash.  Allergic/Immunologic: Negative for environmental allergies.  Neurological:  Positive for numbness. Negative for dizziness, tremors, seizures, weakness and headaches.  Hematological:  Does not bruise/bleed easily.  Psychiatric/Behavioral:  Negative for suicidal ideas. The patient is not nervous/anxious.       Objective:    Physical Exam Vitals reviewed.  Constitutional:      Appearance: Normal appearance. She is well-developed and normal weight. She is not diaphoretic.  HENT:     Head: Normocephalic and atraumatic.     Nose: No nasal deformity, septal deviation, mucosal edema or rhinorrhea.     Right Sinus: No maxillary sinus tenderness or frontal sinus  tenderness.     Left Sinus: No maxillary sinus tenderness or frontal sinus tenderness.     Mouth/Throat:     Pharynx: No oropharyngeal exudate.  Eyes:     General: No scleral icterus.    Conjunctiva/sclera: Conjunctivae normal.     Pupils: Pupils are equal, round, and reactive to light.  Neck:     Thyroid: No thyromegaly.     Vascular: No carotid bruit or JVD.     Trachea: Trachea normal. No tracheal tenderness or tracheal deviation.  Cardiovascular:     Rate and Rhythm: Normal rate and regular rhythm.     Chest Wall: PMI is not displaced.     Pulses: Normal pulses. No decreased pulses.     Heart sounds: Normal heart sounds, S1 normal and S2 normal. Heart sounds not distant. No murmur heard.    No systolic murmur is present.     No diastolic murmur is present.     No friction rub. No gallop. No S3 or S4 sounds.  Pulmonary:     Effort: No tachypnea, accessory muscle usage or respiratory distress.     Breath sounds: No stridor. No decreased breath sounds, wheezing, rhonchi or rales.  Chest:     Chest wall: No tenderness.  Abdominal:     General: Bowel sounds are normal. There is no distension.     Palpations: Abdomen is soft. Abdomen is not rigid.     Tenderness: There is no abdominal tenderness. There is no guarding or rebound.  Musculoskeletal:        General: Tenderness present. Normal range of motion.     Cervical back: Normal range of motion and neck supple. No edema, erythema or rigidity. No muscular tenderness. Normal range of  motion.     Comments: Tender lower lumbar spine area  Lymphadenopathy:     Head:     Right side of head: No submental or submandibular adenopathy.     Left side of head: No submental or submandibular adenopathy.     Cervical: No cervical adenopathy.  Skin:    General: Skin is warm and dry.     Coloration: Skin is not pale.     Findings: No rash.     Nails: There is no clubbing.  Neurological:     Mental Status: She is alert and oriented to  person, place, and time.     Sensory: No sensory deficit.  Psychiatric:        Attention and Perception: Attention normal.        Mood and Affect: Mood is depressed.        Speech: Speech normal.        Behavior: Behavior normal. Behavior is cooperative.        Thought Content: Thought content does not include homicidal or suicidal ideation.        Cognition and Memory: Cognition and memory normal.        Judgment: Judgment normal.     There were no vitals taken for this visit. Wt Readings from Last 3 Encounters:  07/09/22 145 lb 3.2 oz (65.9 kg)  06/04/22 149 lb (67.6 kg)  04/03/22 141 lb 12.8 oz (64.3 kg)     Health Maintenance Due  Topic Date Due   DTaP/Tdap/Td (1 - Tdap) Never done   Lung Cancer Screening  Never done   OPHTHALMOLOGY EXAM  11/30/2020   FOOT EXAM  09/03/2022   COLON CANCER SCREENING ANNUAL FOBT  09/06/2022    There are no preventive care reminders to display for this patient.  Lab Results  Component Value Date   TSH 0.970 11/12/2020   Lab Results  Component Value Date   WBC 10.3 04/11/2021   HGB 13.6 04/11/2021   HCT 43.2 04/11/2021   MCV 92 04/11/2021   PLT 449 04/11/2021   Lab Results  Component Value Date   NA 139 06/04/2022   K 4.1 06/04/2022   CO2 21 06/04/2022   GLUCOSE 61 (L) 06/04/2022   BUN 12 06/04/2022   CREATININE 0.96 06/04/2022   BILITOT <0.2 04/19/2021   ALKPHOS 164 (H) 04/19/2021   AST 42 (H) 04/19/2021   ALT 60 (H) 12/29/2020   PROT 7.3 04/19/2021   ALBUMIN 4.6 06/04/2022   CALCIUM 9.9 06/04/2022   ANIONGAP 7 12/31/2020   EGFR 66 06/04/2022   Lab Results  Component Value Date   CHOL 314 (H) 11/12/2020   Lab Results  Component Value Date   HDL 42 11/12/2020   Lab Results  Component Value Date   LDLCALC 194 (H) 11/12/2020   Lab Results  Component Value Date   TRIG 386 (H) 11/12/2020   Lab Results  Component Value Date   CHOLHDL 7.5 (H) 11/12/2020   Lab Results  Component Value Date   HGBA1C 8.1 (A)  06/04/2022      Assessment & Plan:   Problem List Items Addressed This Visit   None No orders of the defined types were placed in this encounter.   Follow-up: No follow-ups on file.    Asencion Noble, MD

## 2022-11-11 ENCOUNTER — Other Ambulatory Visit: Payer: Self-pay

## 2022-11-11 ENCOUNTER — Ambulatory Visit: Payer: 59 | Attending: Critical Care Medicine | Admitting: Critical Care Medicine

## 2022-11-11 ENCOUNTER — Telehealth: Payer: Self-pay | Admitting: Critical Care Medicine

## 2022-11-11 ENCOUNTER — Encounter: Payer: Self-pay | Admitting: Critical Care Medicine

## 2022-11-11 VITALS — BP 125/78 | HR 86 | Wt 147.0 lb

## 2022-11-11 DIAGNOSIS — Z122 Encounter for screening for malignant neoplasm of respiratory organs: Secondary | ICD-10-CM

## 2022-11-11 DIAGNOSIS — N189 Chronic kidney disease, unspecified: Secondary | ICD-10-CM | POA: Insufficient documentation

## 2022-11-11 DIAGNOSIS — I739 Peripheral vascular disease, unspecified: Secondary | ICD-10-CM

## 2022-11-11 DIAGNOSIS — Z9889 Other specified postprocedural states: Secondary | ICD-10-CM | POA: Diagnosis not present

## 2022-11-11 DIAGNOSIS — E785 Hyperlipidemia, unspecified: Secondary | ICD-10-CM | POA: Diagnosis not present

## 2022-11-11 DIAGNOSIS — I129 Hypertensive chronic kidney disease with stage 1 through stage 4 chronic kidney disease, or unspecified chronic kidney disease: Secondary | ICD-10-CM | POA: Diagnosis not present

## 2022-11-11 DIAGNOSIS — F319 Bipolar disorder, unspecified: Secondary | ICD-10-CM | POA: Insufficient documentation

## 2022-11-11 DIAGNOSIS — E1151 Type 2 diabetes mellitus with diabetic peripheral angiopathy without gangrene: Secondary | ICD-10-CM | POA: Insufficient documentation

## 2022-11-11 DIAGNOSIS — Z794 Long term (current) use of insulin: Secondary | ICD-10-CM | POA: Diagnosis not present

## 2022-11-11 DIAGNOSIS — F203 Undifferentiated schizophrenia: Secondary | ICD-10-CM | POA: Diagnosis not present

## 2022-11-11 DIAGNOSIS — N289 Disorder of kidney and ureter, unspecified: Secondary | ICD-10-CM | POA: Diagnosis not present

## 2022-11-11 DIAGNOSIS — F17210 Nicotine dependence, cigarettes, uncomplicated: Secondary | ICD-10-CM | POA: Insufficient documentation

## 2022-11-11 DIAGNOSIS — E1143 Type 2 diabetes mellitus with diabetic autonomic (poly)neuropathy: Secondary | ICD-10-CM | POA: Diagnosis not present

## 2022-11-11 DIAGNOSIS — E1165 Type 2 diabetes mellitus with hyperglycemia: Secondary | ICD-10-CM | POA: Diagnosis not present

## 2022-11-11 DIAGNOSIS — E1122 Type 2 diabetes mellitus with diabetic chronic kidney disease: Secondary | ICD-10-CM | POA: Diagnosis not present

## 2022-11-11 DIAGNOSIS — R69 Illness, unspecified: Secondary | ICD-10-CM | POA: Diagnosis not present

## 2022-11-11 DIAGNOSIS — E782 Mixed hyperlipidemia: Secondary | ICD-10-CM | POA: Diagnosis not present

## 2022-11-11 DIAGNOSIS — Z1211 Encounter for screening for malignant neoplasm of colon: Secondary | ICD-10-CM

## 2022-11-11 DIAGNOSIS — I10 Essential (primary) hypertension: Secondary | ICD-10-CM | POA: Diagnosis not present

## 2022-11-11 DIAGNOSIS — Z720 Tobacco use: Secondary | ICD-10-CM

## 2022-11-11 LAB — POCT GLYCOSYLATED HEMOGLOBIN (HGB A1C): HbA1c, POC (controlled diabetic range): 8.4 % — AB (ref 0.0–7.0)

## 2022-11-11 LAB — GLUCOSE, POCT (MANUAL RESULT ENTRY): POC Glucose: 203 mg/dl — AB (ref 70–99)

## 2022-11-11 MED ORDER — NOVOLOG FLEXPEN 100 UNIT/ML ~~LOC~~ SOPN
8.0000 [IU] | PEN_INJECTOR | Freq: Three times a day (TID) | SUBCUTANEOUS | 1 refills | Status: DC
  Filled 2022-11-11 – 2022-12-17 (×2): qty 6, 25d supply, fill #0
  Filled 2023-01-12: qty 6, 25d supply, fill #1

## 2022-11-11 MED ORDER — GLIPIZIDE ER 10 MG PO TB24
20.0000 mg | ORAL_TABLET | Freq: Every day | ORAL | 4 refills | Status: DC
  Filled 2022-11-11 – 2022-11-18 (×2): qty 60, 30d supply, fill #0
  Filled 2022-12-17: qty 60, 30d supply, fill #1

## 2022-11-11 MED ORDER — BASAGLAR KWIKPEN 100 UNIT/ML ~~LOC~~ SOPN
70.0000 [IU] | PEN_INJECTOR | Freq: Every day | SUBCUTANEOUS | 2 refills | Status: DC
  Filled 2022-11-11 – 2022-12-17 (×2): qty 15, 21d supply, fill #0

## 2022-11-11 MED ORDER — QUETIAPINE FUMARATE 400 MG PO TABS
400.0000 mg | ORAL_TABLET | Freq: Two times a day (BID) | ORAL | 1 refills | Status: DC
  Filled 2022-11-11 – 2022-11-18 (×2): qty 60, 30d supply, fill #0
  Filled 2022-12-17: qty 60, 30d supply, fill #1

## 2022-11-11 MED ORDER — ROSUVASTATIN CALCIUM 40 MG PO TABS
40.0000 mg | ORAL_TABLET | Freq: Every day | ORAL | 1 refills | Status: DC
  Filled 2022-11-11 – 2022-11-18 (×2): qty 30, 30d supply, fill #0
  Filled 2022-12-17: qty 30, 30d supply, fill #1

## 2022-11-11 NOTE — Assessment & Plan Note (Signed)
On a statin has peripheral artery disease last check in 2022 lipids were elevated we will recheck this visit continue current rosuvastatin dose at 40 mg daily

## 2022-11-11 NOTE — Assessment & Plan Note (Signed)
Shared decision making was performed patient agrees to low-dose lung cancer screening

## 2022-11-11 NOTE — Assessment & Plan Note (Signed)
Blood pressure at goal and is not on blood pressure medication will continue to monitor off medication

## 2022-11-11 NOTE — Assessment & Plan Note (Signed)
Referral back to neurosurgery made

## 2022-11-11 NOTE — Assessment & Plan Note (Signed)
Reassess renal panel

## 2022-11-11 NOTE — Assessment & Plan Note (Signed)
    Current smoking consumption amount: 1 pack a day  Dicsussion on advise to quit smoking and smoking impacts: Cardiovascular impacts  Patient's willingness to quit: Not engaged to quit  Methods to quit smoking discussed: Behavioral modification  Medication management of smoking session drugs discussed: Nicotine replacement  Resources provided:  AVS   Setting quit date not established  Follow-up arranged 2 months   Time spent counseling the patient: 5 minutes

## 2022-11-11 NOTE — Telephone Encounter (Signed)
Patient with severe depression over loss of grandchild to a drug overdose please see for grief counseling So see if you can determine who is seeing her for her mental health

## 2022-11-11 NOTE — Patient Instructions (Addendum)
Increase insulin glargine to 70 units daily increase NovoLog insulin to 8 units 3 times daily before food  We are having clinical pharmacist [facility] check on a coupon card for Trulicity  Stay on glipizide for now  No other medication changes refill sent  Your blood pressure is excellent you do not need to be on blood pressure medication  Low-dose lung cancer CT scan will be done to screen you for lung cancer and you will pick up a colon cancer screening kit today from the lab  Focus on trying to reduce your tobacco intake if possible  Referral back to neurosurgery will be made for your neck pain  [name] will call you regarding your depression  Return to see [name] our clinical pharmacist in 3 weeks and [name] 2 months

## 2022-11-11 NOTE — Assessment & Plan Note (Addendum)
Type 2 diabetes remains uncontrolled, foot exam normal  Will partner with clinical pharmacy for short-term follow-up and see if we get her a drug coupon card for Trulicity  In the interim we will increase Basaglar to 70 units daily and NovoLog to 8 units 3 times a day before meals.  Patient given a lifestyle medicine approach.  Will also need mental health counseling The following Lifestyle Medicine recommendations according to [facility]) were discussed and offered to patient who agrees to start the journey:  A. Whole Foods, Plant-based plate comprising of fruits and vegetables, plant-based proteins, whole-grain carbohydrates was discussed in detail with the patient.   A list for source of those nutrients were also provided to the patient.  Patient will use only water or unsweetened tea for hydration. B.  The need to stay away from risky substances including alcohol, smoking; obtaining 7 to 9 hours of restorative sleep, at least 150 minutes of moderate intensity exercise weekly, the importance of healthy social connections,  and stress reduction techniques were discussed. C.  A full color page of  Calorie density of various food groups per pound showing examples of each food groups was provided to the patient.

## 2022-11-12 ENCOUNTER — Telehealth: Payer: Self-pay

## 2022-11-12 LAB — RENAL FUNCTION PANEL
Albumin: 4.7 g/dL (ref 3.9–4.9)
BUN/Creatinine Ratio: 14 (ref 12–28)
BUN: 13 mg/dL (ref 8–27)
CO2: 22 mmol/L (ref 20–29)
Calcium: 10.3 mg/dL (ref 8.7–10.3)
Chloride: 103 mmol/L (ref 96–106)
Creatinine, Ser: 0.92 mg/dL (ref 0.57–1.00)
Glucose: 78 mg/dL (ref 70–99)
Phosphorus: 3.4 mg/dL (ref 3.0–4.3)
Potassium: 4.3 mmol/L (ref 3.5–5.2)
Sodium: 144 mmol/L (ref 134–144)
eGFR: 70 mL/min/{1.73_m2} (ref >59)

## 2022-11-12 LAB — LIPID PANEL
Chol/HDL Ratio: 3.7 ratio (ref 0.0–4.4)
Cholesterol, Total: 153 mg/dL (ref 100–199)
HDL: 41 mg/dL (ref >39)
LDL Chol Calc (NIH): 77 mg/dL (ref 0–99)
Triglycerides: 210 mg/dL — ABNORMAL HIGH (ref 0–149)
VLDL Cholesterol Cal: 35 mg/dL (ref 5–40)

## 2022-11-12 NOTE — Telephone Encounter (Signed)
-----   Message from Elsie Stain, MD sent at 11/12/2022  6:08 AM EST ----- Let pt know cholesterol is at goal, kidney liver normal

## 2022-11-12 NOTE — Progress Notes (Signed)
Let pt know cholesterol is at goal, kidney liver normal

## 2022-11-12 NOTE — Telephone Encounter (Signed)
Pt was called and vm was left, Information has been sent to nurse pool.   

## 2022-11-18 ENCOUNTER — Other Ambulatory Visit: Payer: Self-pay

## 2022-11-21 ENCOUNTER — Other Ambulatory Visit: Payer: Self-pay

## 2022-12-03 ENCOUNTER — Encounter: Payer: Self-pay | Admitting: Critical Care Medicine

## 2022-12-08 ENCOUNTER — Ambulatory Visit: Payer: 59 | Admitting: Pharmacist

## 2022-12-09 ENCOUNTER — Ambulatory Visit
Admission: RE | Admit: 2022-12-09 | Discharge: 2022-12-09 | Disposition: A | Discharge status: Home / Self Care | Payer: 59 | Source: Ambulatory Visit | Attending: Critical Care Medicine | Admitting: Critical Care Medicine

## 2022-12-09 DIAGNOSIS — F17210 Nicotine dependence, cigarettes, uncomplicated: Secondary | ICD-10-CM | POA: Diagnosis not present

## 2022-12-09 DIAGNOSIS — Z122 Encounter for screening for malignant neoplasm of respiratory organs: Secondary | ICD-10-CM

## 2022-12-11 ENCOUNTER — Other Ambulatory Visit: Payer: Self-pay | Admitting: Critical Care Medicine

## 2022-12-11 DIAGNOSIS — K7689 Other specified diseases of liver: Secondary | ICD-10-CM

## 2022-12-11 DIAGNOSIS — R911 Solitary pulmonary nodule: Secondary | ICD-10-CM | POA: Insufficient documentation

## 2022-12-11 DIAGNOSIS — I700 Atherosclerosis of aorta: Secondary | ICD-10-CM | POA: Insufficient documentation

## 2022-12-11 DIAGNOSIS — J438 Other emphysema: Secondary | ICD-10-CM

## 2022-12-11 NOTE — Telephone Encounter (Signed)
LCSWA called patient today to introduce herself and to assess patients' mental health needs.Patient was referred by PCP for grief counseling. Patient reports she is doing fine and is not interested in resources or grief counseling.

## 2022-12-11 NOTE — Progress Notes (Signed)
Pt aware of results  will order repeat CT chest 6 mo and MRI of liver

## 2022-12-17 ENCOUNTER — Other Ambulatory Visit: Payer: Self-pay

## 2022-12-18 ENCOUNTER — Other Ambulatory Visit: Payer: Self-pay

## 2023-01-02 ENCOUNTER — Encounter: Payer: Self-pay | Admitting: Critical Care Medicine

## 2023-01-05 ENCOUNTER — Ambulatory Visit
Admission: RE | Admit: 2023-01-05 | Discharge: 2023-01-05 | Disposition: A | Discharge status: Home / Self Care | Payer: 59 | Source: Ambulatory Visit | Attending: Critical Care Medicine | Admitting: Critical Care Medicine

## 2023-01-05 ENCOUNTER — Encounter: Payer: Self-pay | Admitting: Critical Care Medicine

## 2023-01-05 ENCOUNTER — Telehealth: Payer: Self-pay | Admitting: Critical Care Medicine

## 2023-01-05 DIAGNOSIS — K7689 Other specified diseases of liver: Secondary | ICD-10-CM

## 2023-01-05 DIAGNOSIS — D1803 Hemangioma of intra-abdominal structures: Secondary | ICD-10-CM | POA: Insufficient documentation

## 2023-01-05 MED ORDER — GADOPICLENOL 0.5 MMOL/ML IV SOLN
6.0000 mL | Freq: Once | INTRAVENOUS | Status: AC | PRN
  Administered 2023-01-05: 6 mL via INTRAVENOUS

## 2023-01-05 NOTE — Telephone Encounter (Signed)
Pt is calling back to speak to [name]. Pt reports that she was speaking with [name]. When the phone cut off. Please advise  [phone]

## 2023-01-05 NOTE — Progress Notes (Signed)
Pt aware of results will monitor

## 2023-01-06 NOTE — Telephone Encounter (Signed)
Noted  

## 2023-01-06 NOTE — Telephone Encounter (Signed)
I was able to call the patient back to complete the conversation

## 2023-01-11 NOTE — Progress Notes (Unsigned)
Established Patient Office Visit  Subjective:  Patient ID: Ashley [name], female    DOB: Jul 18, 1961  Age: 62 y.o. MRN: [id number]  CC:  No chief complaint on file.   HPI 08/2022 [name] presents for primary care follow-up.  Patient with history of type 2 diabetes bipolar disorder hyperlipidemia  The patient is coming up on the first anniversary of the death of her daughter at age 59 of lupus.  She has been very depressed and disengaged around her health.  She had not been taking her insulin product or Glucotrol.  On arrival blood sugar was 435 and A1c was 10.6.  She does complain of numbness in the fingers and right hip pain.  She declines to receive any vaccinations at this time.  She does have a colon cancer screening she states she will consider following through on this.  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 The patient appears to recover from that surgery.  She states the numbness in the fingers were not occurring prior to the cervical surgery.  Patient does not have any other complaints at this time.  Her PHQ 9 and GAD-7 are elevated but she is not suicidal.  She still smokes a pack a day of cigarettes and is not engaged around quitting smoking at this time.  Also she is taking Seroquel on a regular basis but declines to see a provider for her mental health.  01/29/22 Patient returns in follow-up for hypertension on arrival blood pressure 120/80.  Blood sugar 194 A1c 9.8 down point from 10.6.  Not able to buy healthy food does not have Medicaid.  Patient needs to schedule her mammogram.  Has no other complaints at this visit.  She wants to give plasma. Continue glipizide  6/29 Patient seen in return follow-up since the last visit he did see [name] in clinical pharmacy several weeks ago and  now has had Trulicity added to her program and is been switched to Basaglar 60 units daily and discontinuing glipizide daily.  Patient has no real complaints today except that she does not like where she is living she does not get on along well with the individual running her apartment complex.  She is trying to move into a different complex.  Patient still smoking a cigarette pack a day  Blood pressure on arrival was elevated 146/92 Patient's not on any blood pressure medicine she had been normal tensive previously but has slipped gradually back into the hypertensive state owing to the fact that she continues to smoke and is under a great deal of stress.  Patient has no other complaints.  Below is clinical pharmacy visit Saw [name] 03/17/22: Diabetes longstanding currently uncontrolled given last A1c 9.8. Patient is able to verbalize appropriate hypoglycemia management plan. Medication adherence appears to be improving. She seems to be more engaged in improving her health. Due to high cost of Levemir, will switch BID Levemir to daily Basaglar which is free at our pharmacy. Will also start GLP-1 RA with CV and renal benefit given history of stroke and CKD and to better improve glycemic control. Will hopefully be able to decrease glipizide in the future as we increase Trulicity as tolerated to reduce hypoglycemic risk.  -Start Trulicity 4.48 mg weekly.  -Switch Levemir to Basaglar 60 units daily.  -Continue glipizide  XL 20 mg daily.  -Patient educated on purpose, proper use, and potential adverse effects of Trulicity.  -Extensively discussed pathophysiology of diabetes, recommended lifestyle interventions, dietary effects on blood sugar control.  -Counseled on s/sx of and management of hypoglycemia.  -Next A1c anticipated July 2023.    8/30 Patient seen in return follow-up she is still smoking about 10 cigarettes daily on arrival blood pressure is elevated 158/103 and this does not change on a  recheck.  Unfortunately she did not pick up and start the amlodipine from the last visit in June.  She states insurance co-pays are challenged for her.  She still complaining of low back pain had previous lumbar surgery would like another x-ray.  Blood sugar on arrival is 154 and A1c is down to 8.1.  She has been compliant with the Trulicity Basaglar and glipizide.  10/4 Patient returns in follow-up and she never filled the amlodipine she went home and changed her diet remove salt from the diet and on arrival today blood pressure is 125/85.  Patient continues to have low back pain and has been previously seen by neurosurgery who performed lumbar spine and neck fusion.  Recent chest x-ray and lumbar spine films showed progression of lumbar disease.  Note patient is now insured with [employer].  She has no other complaints except for her back pain.  11/11/22 Patient is seen in return follow-up last saw clinical pharmacist in November at that time A1c was 8.1 unfortunately today it is gone up even higher to a level of 8.4.  Blood sugar on arrival today is 203.  Blood sugars at home run in the 280 range.  She eats a lot of concentrated sweets.  Also she was not able to get the Trulicity that was ordered previously because of very high co-pay.  Today she needs a foot exam needs to have a repeat colon exam for cancer screening she also needs low-dose lung cancer screening she still smoking a pack a day of cigarettes.  Another issue she is having recurrent right neck pain and right hand weakness and numbness and may have recurrence in her cervical disc disease.  She had emergent surgery in 2022 because of quadriplegia and myelopathy and had significant spinal disease in the C-spine.  She has an associated neurosurgeon who has followed her.  She will need a follow-up appointment.  Another issue remains quite depressed after her grand son passed away from a drug overdose last fall.  She has had other losses as well.  She  has declined to receive mental health follow-up.  She is on the Seroquel and has history of borderline schizophrenia and bipolar disorder.  Blood pressure on arrival today is 125/78. Saw luke 10/29/2022 Expand All Collapse All  S:    Ashley [name] is a 62 y.o. female who presents for diabetes evaluation, education, and management. PMH is significant for T2DM, bipolar disorder, HLD, HTN, PVD, stroke, CKD. Patient was referred and last seen by Primary Care Provider, Dr. Delford FieldWright, on 07/09/2022. I saw her on 08/05/2022. Unfortunately, she did not follow-up as instructed.    Today, patient arrives in poor spirits and presents without any assistance. She is not feeling good today, specifically, she tells me she feels nauseous. Denies any abdominal pain, vomiting, blood stools. She tells me this is "normal" for her. Additionally, she tells me that a lot has been going on since our last visit. Her granddaughter passed in November. She's been without most of her medications since  then. She tells me "she just hasn't been treating her body right". Denies any current depressive symptoms. No suicidal intent or plan. No homicidal intent or plan. She tells me she is motivated to get back on the right track.  Diabetes longstanding currently uncontrolled given last A1c of 8.1. She does not wish to give bloodwork today as she does not feel good. Patient is able to verbalize appropriate hypoglycemia management plan. Medication adherence is suboptimal. .  -Restart Trulicity. We will see if her copay is not cost prohibitive.   -Restart Basaglar 60 units daily.  -Restart glipizide XL 20 mg daily.  -Restart Novolog 5u TID with meals.     A1c foot colon ?lung ca eye depression  Past Medical History:  Diagnosis Date   Anxiety    Arthritis    Bipolar 1 disorder (HCC)    Cervical myelopathy (HCC) 12/28/2020   Cervical spinal cord compression (HCC) 12/19/2020   Depression    Diabetes mellitus without complication (HCC)     Type II   GERD (gastroesophageal reflux disease)    Heart murmur    "nothing to worry about"   HNP (herniated nucleus pulposus) with myelopathy, cervical    HTN (hypertension)    not on medication   Quadriplegia and quadriparesis (HCC)    Stroke (HCC) 11/2020    Past Surgical History:  Procedure Laterality Date   ABDOMINAL HYSTERECTOMY     ANTERIOR CERVICAL DECOMP/DISCECTOMY FUSION N/A 12/25/2020   Procedure: CERVICAL THREE-FOUR ANTERIOR CERVICAL DECOMPRESSION/DISCECTOMY FUSION;  Surgeon: Jadene Pierini, MD;  Location: [facility] OR;  Service: Neurosurgery;  Laterality: N/A;  CERVICAL THREE-FOUR ANTERIOR CERVICAL DECOMPRESSION/DISCECTOMY FUSION    LUMBAR LAMINECTOMY/DECOMPRESSION MICRODISCECTOMY Left 04/07/2016   Procedure: Left Lumbar four-five Microdiskectomy;  Surgeon: Maeola Harman, MD;  Location: [facility];  Service: Neurosurgery;  Laterality: Left;    Family History  Problem Relation Age of Onset   Healthy Mother    Diabetes Mellitus II Father    Breast cancer Sister    Diabetes Brother    Heart disease Neg Hx    Stroke Neg Hx    Kidney disease Neg Hx     Social History   Socioeconomic History   Marital status: Divorced    Spouse name: Not on file   Number of children: 3   Years of education: Not on file   Highest education level: High school graduate  Occupational History   Not on file  Tobacco Use   Smoking status: Every Day    Packs/day: 1.00    Years: 39.00    Additional pack years: 0.00    Total pack years: 39.00    Types: Cigarettes   Smokeless tobacco: Never  Vaping Use   Vaping Use: Never used  Substance and Sexual Activity   Alcohol use: No   Drug use: Yes    Types: Marijuana    Comment: Cocaine -- 04/06/16- last time   Sexual activity: Yes    Birth control/protection: Surgical  Other Topics Concern   Not on file  Social History Narrative   Lives with daughter   Right Handed   Drinks 1 cup of coffee per day      Social Determinants of  Health   Financial Resource Strain: Not on file  Food Insecurity: Not on file  Transportation Needs: Not on file  Physical Activity: Not on file  Stress: Not on file  Social Connections: Not on file  Intimate Partner Violence: Not on file    Outpatient  Medications Prior to Visit  Medication Sig Dispense Refill   acetaminophen (TYLENOL) 325 MG tablet Take 1-2 tablets (325-650 mg total) by mouth every 4 (four) hours as needed for mild pain. 60 tablet 1   ascorbic acid (VITAMIN C) 500 MG tablet Take 1 tablet (500 mg total) by mouth daily.     Blood Glucose Monitoring Suppl (TRUE METRIX METER) w/Device KIT Use to check blood sugar up to 3 times daily. 1 kit 0   Dulaglutide (TRULICITY) 0.75 MG/0.5ML SOPN Inject 0.75 mg into the skin once a week. (Patient not taking: Reported on 11/11/2022) 2 mL 1   glipiZIDE (GLUCOTROL XL) 10 MG 24 hr tablet Take 2 tablets (20 mg total) by mouth once daily. 60 tablet 4   glucose blood (TRUE METRIX BLOOD GLUCOSE TEST) test strip Use to check blood sugar up to 3 times daily. 100 each 2   insulin aspart (NOVOLOG FLEXPEN) 100 UNIT/ML FlexPen Inject 8 Units into the skin 3 (three) times daily with meals. 15 mL 1   Insulin Glargine (BASAGLAR KWIKPEN) 100 UNIT/ML Inject 70 Units into the skin daily. 15 mL 2   Insulin Pen Needle (TECHLITE PEN NEEDLES) 32G X 4 MM MISC Use as directed. 100 each 3   Multiple Vitamin (MULTIVITAMIN WITH MINERALS) TABS tablet Take 1 tablet by mouth daily.     QUEtiapine (SEROQUEL) 400 MG tablet Take 1 tablet (400 mg total) by mouth 2 (two) times daily. 60 tablet 1   rosuvastatin (CRESTOR) 40 MG tablet Take 1 tablet (40 mg total) by mouth once daily. 90 tablet 1   tiZANidine (ZANAFLEX) 4 MG tablet Take 2 tablet(s) by mouth 1 to 3  times daily as needed. 30 tablet 2   TRUEplus Lancets 28G MISC Use to check blood sugar up to 3 times daily. 100 each 3   No facility-administered medications prior to visit.    No Known Allergies  ROS Review  of Systems  Constitutional:  Negative for chills, diaphoresis and fever.  HENT:  Negative for congestion, hearing loss, nosebleeds, sore throat and tinnitus.   Eyes:  Negative for photophobia and redness.  Respiratory:  Negative for cough, shortness of breath, wheezing and stridor.   Cardiovascular:  Negative for chest pain, palpitations and leg swelling.  Gastrointestinal:  Negative for abdominal pain, blood in stool, constipation, diarrhea, nausea and vomiting.  Endocrine: Negative for polydipsia.  Genitourinary:  Negative for dysuria, flank pain, frequency, hematuria and urgency.  Musculoskeletal:  Positive for neck pain. Negative for back pain and myalgias.  Skin:  Negative for rash.  Allergic/Immunologic: Negative for environmental allergies.  Neurological:  Positive for numbness. Negative for dizziness, tremors, seizures, weakness and headaches.  Hematological:  Does not bruise/bleed easily.  Psychiatric/Behavioral:  Positive for dysphoric mood. Negative for suicidal ideas. The patient is nervous/anxious.       Objective:    Physical Exam Vitals reviewed.  Constitutional:      Appearance: Normal appearance. She is well-developed and normal weight. She is not diaphoretic.  HENT:     Head: Normocephalic and atraumatic.     Nose: No nasal deformity, septal deviation, mucosal edema or rhinorrhea.     Right Sinus: No maxillary sinus tenderness or frontal sinus tenderness.     Left Sinus: No maxillary sinus tenderness or frontal sinus tenderness.     Mouth/Throat:     Pharynx: No oropharyngeal exudate.  Eyes:     General: No scleral icterus.    Conjunctiva/sclera: Conjunctivae normal.  Pupils: Pupils are equal, round, and reactive to light.  Neck:     Thyroid: No thyromegaly.     Vascular: No carotid bruit or JVD.     Trachea: Trachea normal. No tracheal tenderness or tracheal deviation.  Cardiovascular:     Rate and Rhythm: Normal rate and regular rhythm.     Chest Wall:  PMI is not displaced.     Pulses: Normal pulses. No decreased pulses.     Heart sounds: Normal heart sounds, S1 normal and S2 normal. Heart sounds not distant. No murmur heard.    No systolic murmur is present.     No diastolic murmur is present.     No friction rub. No gallop. No S3 or S4 sounds.  Pulmonary:     Effort: No tachypnea, accessory muscle usage or respiratory distress.     Breath sounds: No stridor. Wheezing present. No decreased breath sounds, rhonchi or rales.  Chest:     Chest wall: No tenderness.  Abdominal:     General: Bowel sounds are normal. There is no distension.     Palpations: Abdomen is soft. Abdomen is not rigid.     Tenderness: There is no abdominal tenderness. There is no guarding or rebound.  Musculoskeletal:        General: Tenderness present. Normal range of motion.     Cervical back: Normal range of motion and neck supple. No edema, erythema or rigidity. No muscular tenderness. Normal range of motion.     Comments: Right lateral neck area Foot exam normal  Lymphadenopathy:     Head:     Right side of head: No submental or submandibular adenopathy.     Left side of head: No submental or submandibular adenopathy.     Cervical: No cervical adenopathy.  Skin:    General: Skin is warm and dry.     Coloration: Skin is not pale.     Findings: No rash.     Nails: There is no clubbing.  Neurological:     Mental Status: She is alert and oriented to person, place, and time.     Sensory: Sensory deficit present.     Motor: Weakness present.  Psychiatric:        Attention and Perception: Attention normal.        Mood and Affect: Mood is depressed.        Speech: Speech normal.        Behavior: Behavior normal. Behavior is cooperative.        Thought Content: Thought content does not include homicidal or suicidal ideation.        Cognition and Memory: Cognition and memory normal.        Judgment: Judgment normal.     There were no vitals taken for this  visit. Wt Readings from Last 3 Encounters:  11/11/22 147 lb (66.7 kg)  07/09/22 145 lb 3.2 oz (65.9 kg)  06/04/22 149 lb (67.6 kg)     Health Maintenance Due  Topic Date Due   DTaP/Tdap/Td (1 - Tdap) Never done   OPHTHALMOLOGY EXAM  11/30/2020   COLON CANCER SCREENING ANNUAL FOBT  09/06/2022    There are no preventive care reminders to display for this patient.  Lab Results  Component Value Date   TSH 0.970 11/12/2020   Lab Results  Component Value Date   WBC 10.3 04/11/2021   HGB 13.6 04/11/2021   HCT 43.2 04/11/2021   MCV 92 04/11/2021   PLT 449 04/11/2021  Lab Results  Component Value Date   NA 144 11/11/2022   K 4.3 11/11/2022   CO2 22 11/11/2022   GLUCOSE 78 11/11/2022   BUN 13 11/11/2022   CREATININE 0.92 11/11/2022   BILITOT <0.2 04/19/2021   ALKPHOS 164 (H) 04/19/2021   AST 42 (H) 04/19/2021   ALT 60 (H) 12/29/2020   PROT 7.3 04/19/2021   ALBUMIN 4.7 11/11/2022   CALCIUM 10.3 11/11/2022   ANIONGAP 7 12/31/2020   EGFR 70 11/11/2022   Lab Results  Component Value Date   CHOL 153 11/11/2022   Lab Results  Component Value Date   HDL 41 11/11/2022   Lab Results  Component Value Date   LDLCALC 77 11/11/2022   Lab Results  Component Value Date   TRIG 210 (H) 11/11/2022   Lab Results  Component Value Date   CHOLHDL 3.7 11/11/2022   Lab Results  Component Value Date   HGBA1C 8.4 (A) 11/11/2022      Assessment & Plan:   Problem List Items Addressed This Visit   None No orders of the defined types were placed in this encounter.  35 minutes spent extra time needed for patient education and sorting out Trulicity access Follow-up: No follow-ups on file.    Shan Levans, MD

## 2023-01-12 ENCOUNTER — Other Ambulatory Visit: Payer: Self-pay | Admitting: Critical Care Medicine

## 2023-01-12 ENCOUNTER — Other Ambulatory Visit: Payer: Self-pay

## 2023-01-13 ENCOUNTER — Encounter: Payer: Self-pay | Admitting: Critical Care Medicine

## 2023-01-13 ENCOUNTER — Ambulatory Visit: Payer: 59 | Admitting: Pharmacist

## 2023-01-13 ENCOUNTER — Other Ambulatory Visit: Payer: Self-pay

## 2023-01-13 ENCOUNTER — Ambulatory Visit: Payer: 59 | Attending: Critical Care Medicine | Admitting: Critical Care Medicine

## 2023-01-13 VITALS — BP 128/78 | HR 91 | Wt 141.4 lb

## 2023-01-13 DIAGNOSIS — D1803 Hemangioma of intra-abdominal structures: Secondary | ICD-10-CM | POA: Diagnosis not present

## 2023-01-13 DIAGNOSIS — E782 Mixed hyperlipidemia: Secondary | ICD-10-CM

## 2023-01-13 DIAGNOSIS — M542 Cervicalgia: Secondary | ICD-10-CM | POA: Diagnosis not present

## 2023-01-13 DIAGNOSIS — Z7985 Long-term (current) use of injectable non-insulin antidiabetic drugs: Secondary | ICD-10-CM | POA: Diagnosis not present

## 2023-01-13 DIAGNOSIS — J438 Other emphysema: Secondary | ICD-10-CM | POA: Diagnosis not present

## 2023-01-13 DIAGNOSIS — D1809 Hemangioma of other sites: Secondary | ICD-10-CM | POA: Insufficient documentation

## 2023-01-13 DIAGNOSIS — Z9889 Other specified postprocedural states: Secondary | ICD-10-CM

## 2023-01-13 DIAGNOSIS — I10 Essential (primary) hypertension: Secondary | ICD-10-CM

## 2023-01-13 DIAGNOSIS — Z8673 Personal history of transient ischemic attack (TIA), and cerebral infarction without residual deficits: Secondary | ICD-10-CM | POA: Diagnosis not present

## 2023-01-13 DIAGNOSIS — E1165 Type 2 diabetes mellitus with hyperglycemia: Secondary | ICD-10-CM

## 2023-01-13 DIAGNOSIS — Z79899 Other long term (current) drug therapy: Secondary | ICD-10-CM | POA: Diagnosis not present

## 2023-01-13 DIAGNOSIS — I700 Atherosclerosis of aorta: Secondary | ICD-10-CM | POA: Diagnosis not present

## 2023-01-13 DIAGNOSIS — Z794 Long term (current) use of insulin: Secondary | ICD-10-CM | POA: Diagnosis not present

## 2023-01-13 DIAGNOSIS — F3170 Bipolar disorder, currently in remission, most recent episode unspecified: Secondary | ICD-10-CM | POA: Diagnosis not present

## 2023-01-13 DIAGNOSIS — N189 Chronic kidney disease, unspecified: Secondary | ICD-10-CM | POA: Diagnosis not present

## 2023-01-13 DIAGNOSIS — Z760 Encounter for issue of repeat prescription: Secondary | ICD-10-CM | POA: Insufficient documentation

## 2023-01-13 DIAGNOSIS — F17210 Nicotine dependence, cigarettes, uncomplicated: Secondary | ICD-10-CM | POA: Diagnosis not present

## 2023-01-13 DIAGNOSIS — Z720 Tobacco use: Secondary | ICD-10-CM

## 2023-01-13 DIAGNOSIS — I129 Hypertensive chronic kidney disease with stage 1 through stage 4 chronic kidney disease, or unspecified chronic kidney disease: Secondary | ICD-10-CM | POA: Insufficient documentation

## 2023-01-13 DIAGNOSIS — Z833 Family history of diabetes mellitus: Secondary | ICD-10-CM | POA: Diagnosis not present

## 2023-01-13 LAB — GLUCOSE, POCT (MANUAL RESULT ENTRY): POC Glucose: 129 mg/dl — AB (ref 70–99)

## 2023-01-13 MED ORDER — ROSUVASTATIN CALCIUM 40 MG PO TABS
40.0000 mg | ORAL_TABLET | Freq: Every day | ORAL | 1 refills | Status: DC
Start: 2023-01-13 — End: 2023-05-19
  Filled 2023-01-13: qty 30, 30d supply, fill #0
  Filled 2023-05-05: qty 90, 90d supply, fill #0

## 2023-01-13 MED ORDER — TIZANIDINE HCL 4 MG PO TABS
8.0000 mg | ORAL_TABLET | Freq: Three times a day (TID) | ORAL | 2 refills | Status: DC
  Filled 2023-02-04: qty 30, 5d supply, fill #0
  Filled 2023-03-10: qty 30, 5d supply, fill #1

## 2023-01-13 MED ORDER — NOVOLOG FLEXPEN 100 UNIT/ML ~~LOC~~ SOPN: 8.0000 [IU] | PEN_INJECTOR | Freq: Three times a day (TID) | SUBCUTANEOUS | 1 refills | Status: DC

## 2023-01-13 MED ORDER — TRULICITY 0.75 MG/0.5ML ~~LOC~~ SOAJ
0.7500 mg | SUBCUTANEOUS | 1 refills | Status: DC
  Filled 2023-01-13 – 2023-01-15 (×2): qty 2, 28d supply, fill #0
  Filled 2023-02-04: qty 2, 28d supply, fill #1

## 2023-01-13 MED ORDER — QUETIAPINE FUMARATE 400 MG PO TABS
400.0000 mg | ORAL_TABLET | Freq: Two times a day (BID) | ORAL | 1 refills | Status: DC
  Filled 2023-01-13: qty 60, 30d supply, fill #0
  Filled 2023-02-04: qty 60, 30d supply, fill #1

## 2023-01-13 MED ORDER — GLIPIZIDE ER 10 MG PO TB24
20.0000 mg | ORAL_TABLET | Freq: Every day | ORAL | 4 refills | Status: DC
Start: 2023-01-13 — End: 2023-05-19
  Filled 2023-01-13 – 2023-03-10 (×2): qty 60, 30d supply, fill #0
  Filled 2023-04-01: qty 60, 30d supply, fill #1
  Filled 2023-05-05: qty 60, 30d supply, fill #2

## 2023-01-13 MED ORDER — BASAGLAR KWIKPEN 100 UNIT/ML ~~LOC~~ SOPN
70.0000 [IU] | PEN_INJECTOR | Freq: Every day | SUBCUTANEOUS | 2 refills | Status: DC
Start: 2023-01-13 — End: 2023-05-19
  Filled 2023-01-13: qty 15, 21d supply, fill #0

## 2023-01-13 NOTE — Assessment & Plan Note (Signed)
Second opinion referral sent to neurosurgery in [place]

## 2023-01-13 NOTE — Assessment & Plan Note (Signed)
  .   Current smoking consumption amount: 1 pack a day  . Dicsussion on advise to quit smoking and smoking impacts: Cardiovascular impacts  . Patient's willingness to quit: Not engaged to quit  . Methods to quit smoking discussed: Behavioral modification  . Medication management of smoking session drugs discussed: Nicotine replacement  . Resources provided:  AVS   . Setting quit date not established  . Follow-up arranged 2 months   Time spent counseling the patient: 5 minutes  

## 2023-01-13 NOTE — Assessment & Plan Note (Signed)
Continue statin. 

## 2023-01-13 NOTE — Assessment & Plan Note (Signed)
Continue with statin

## 2023-01-13 NOTE — Assessment & Plan Note (Signed)
Continued with uncontrolled diabetes continue insulin Basaglar insulin NovoLog aspartame glipizide and Trulicity reassess A1c at a later date

## 2023-01-13 NOTE — Patient Instructions (Signed)
No change in medications  Refills sent to pharmacy  Return Dr Delford Field 4 months

## 2023-01-13 NOTE — Assessment & Plan Note (Signed)
Continue inhaled medication

## 2023-01-13 NOTE — Assessment & Plan Note (Signed)
Refill mental health medication

## 2023-01-13 NOTE — Assessment & Plan Note (Signed)
Well-controlled continue with no medication

## 2023-01-13 NOTE — Assessment & Plan Note (Signed)
Monitor for now

## 2023-01-15 ENCOUNTER — Other Ambulatory Visit: Payer: Self-pay

## 2023-01-16 ENCOUNTER — Other Ambulatory Visit: Payer: Self-pay

## 2023-01-23 NOTE — Progress Notes (Unsigned)
Referring Physician:  Storm Frisk, MD [address] [place],  [place] [address]  Primary Physician:  Storm Frisk, MD  History of Present Illness: 01/27/2023 [name] has a history of DM, emphysema, HTN, bipolar, and stroke.   History of ACDF C3-C4 by Dr. Maurice Small on 12/25/20 for myelopathy. She did get better after surgery until a year ago.   History of left L4-L5 microdiscectomy by Dr. Venetia Maxon on 04/07/16 and Revision PSF L3-S1 by Dr. Noel Gerold on 08/24/17.   In last year, she started with constant left sided neck pain shoots to left shoulder down left arm to above elbow with intermittent right sided neck pain to her shoulder. She had balance issues prior to surgery that improved, but is now getting worse. She has numbness/tingling in right pinky and left ring finger. Pain in her neck wakes her up at night. No weakness in her arms.   She saw some improvement in pain after her lumbar fusion. Now with over a year of intermittent right buttock pain that goes to posterior thigh. No pain past the knee. No left sided pain. Pain is worse with prolonged standing. No numbness, tingling, or weakness in her legs.   Bowel/Bladder Dysfunction: none  She smokes 1 ppd x 44 years.   Conservative measures:  Physical therapy: has not participated  Multimodal medical therapy including regular antiinflammatories: tizanidine  Injections: has not received epidural steroid injections  Past Surgery:  History of ACDF C3-C4 by Dr. Maurice Small on 12/25/20 for myelopathy  History of left L4-L5 microdiscectomy by Dr. Venetia Maxon on 04/07/16 and Revision PSF L3-S1 by Dr. Noel Gerold on 08/24/17.   Ashley [name] has some balance issues. No other symptoms of cervical myelopathy.  The symptoms are causing a significant impact on the patient's life.   Review of Systems:  A 10 point review of systems is negative, except for the pertinent positives and negatives detailed in the HPI.  Past Medical  History: Past Medical History:  Diagnosis Date   Anxiety    Arthritis    Bipolar 1 disorder    Cervical myelopathy 12/28/2020   Cervical spinal cord compression 12/19/2020   Depression    Diabetes mellitus without complication    Type II   GERD (gastroesophageal reflux disease)    Heart murmur    "nothing to worry about"   HNP (herniated nucleus pulposus) with myelopathy, cervical    HTN (hypertension)    not on medication   Quadriplegia and quadriparesis    Stroke 11/2020    Past Surgical History: Past Surgical History:  Procedure Laterality Date   ABDOMINAL HYSTERECTOMY     ANTERIOR CERVICAL DECOMP/DISCECTOMY FUSION N/A 12/25/2020   Procedure: CERVICAL THREE-FOUR ANTERIOR CERVICAL DECOMPRESSION/DISCECTOMY FUSION;  Surgeon: Jadene Pierini, MD;  Location: [facility] OR;  Service: Neurosurgery;  Laterality: N/A;  CERVICAL THREE-FOUR ANTERIOR CERVICAL DECOMPRESSION/DISCECTOMY FUSION    LUMBAR LAMINECTOMY/DECOMPRESSION MICRODISCECTOMY Left 04/07/2016   Procedure: Left Lumbar four-five Microdiskectomy;  Surgeon: Maeola Harman, MD;  Location: [facility];  Service: Neurosurgery;  Laterality: Left;    Allergies: Allergies as of 01/27/2023   (No Known Allergies)    Medications: Outpatient Encounter Medications as of 01/27/2023  Medication Sig   ascorbic acid (VITAMIN C) 500 MG tablet Take 1 tablet (500 mg total) by mouth daily.   Blood Glucose Monitoring Suppl (TRUE METRIX METER) w/Device KIT Use to check blood sugar up to 3 times daily.   Dulaglutide (TRULICITY) 0.75 MG/0.5ML SOPN Inject 0.75 mg  into the skin once a week.   glipiZIDE (GLUCOTROL XL) 10 MG 24 hr tablet Take 2 tablets (20 mg total) by mouth once daily.   glucose blood (TRUE METRIX BLOOD GLUCOSE TEST) test strip Use to check blood sugar up to 3 times daily.   insulin aspart (NOVOLOG FLEXPEN) 100 UNIT/ML FlexPen Inject 8 Units into the skin 3 (three) times daily with meals.   Insulin Glargine (BASAGLAR KWIKPEN) 100 UNIT/ML  Inject 70 Units into the skin daily.   Insulin Pen Needle (TECHLITE PEN NEEDLES) 32G X 4 MM MISC Use as directed.   Multiple Vitamin (MULTIVITAMIN WITH MINERALS) TABS tablet Take 1 tablet by mouth daily.   QUEtiapine (SEROQUEL) 400 MG tablet Take 1 tablet (400 mg total) by mouth 2 (two) times daily.   rosuvastatin (CRESTOR) 40 MG tablet Take 1 tablet (40 mg total) by mouth once daily.   tiZANidine (ZANAFLEX) 4 MG tablet Take 2 tablet(s) by mouth 1 to 3  times daily as needed.   TRUEplus Lancets 28G MISC Use to check blood sugar up to 3 times daily.   [DISCONTINUED] acetaminophen (TYLENOL) 325 MG tablet Take 1-2 tablets (325-650 mg total) by mouth every 4 (four) hours as needed for mild pain.   No facility-administered encounter medications on file as of 01/27/2023.    Social History: Social History   Tobacco Use   Smoking status: Every Day    Packs/day: 1.00    Years: 39.00    Additional pack years: 0.00    Total pack years: 39.00    Types: Cigarettes   Smokeless tobacco: Never  Vaping Use   Vaping Use: Never used  Substance Use Topics   Alcohol use: No   Drug use: Yes    Types: Marijuana    Comment: Cocaine -- 04/06/16- last time    Family Medical History: Family History  Problem Relation Age of Onset   Healthy Mother    Diabetes Mellitus II Father    Breast cancer Sister    Diabetes Brother    Heart disease Neg Hx    Stroke Neg Hx    Kidney disease Neg Hx     Physical Examination: Vitals:   01/27/23 1311  BP: 109/73  Pulse: 91    General: Patient is well developed, well nourished, calm, collected, and in no apparent distress. Attention to examination is appropriate.  Respiratory: Patient is breathing without any difficulty.   NEUROLOGICAL:     Awake, alert, oriented to person, place, and time.  Speech is clear and fluent. Fund of knowledge is appropriate.   Cranial Nerves: Pupils equal round and reactive to light.  Facial tone is symmetric.    Well healed  cervical and lumbar incisions.   No lower lumbar tenderness.   No abnormal lesions on exposed skin.   Strength: Side Biceps Triceps Deltoid Interossei Grip Wrist Ext. Wrist Flex.  R 5 4+ 5 4- L 5 4+ Side Iliopsoas Quads Hamstring PF DF EHL  R L Reflexes are 2+ and symmetric at the biceps, triceps, brachioradialis, patella and achilles.   Hoffman's is absent.  Clonus is not present.   Bilateral upper and lower extremity sensation is intact to light touch.     Gait is slow and slightly unsteady.   Medical Decision Making  Imaging: Lumbar xrays dated 07/04/22:  FINDINGS: Post PLIF L3-S1.  Hardware intact without lucency. Alignment preserved. No fracture or dislocation. Mild interval narrowing of the L2-3 interspace with vacuum phenomena, sclerosis in the adjacent vertebral bodies and anterior spurring. Minimal calcified aortic plaque.   IMPRESSION: 1. Stable PLIF L3-S1 2. Progressive adjacent level  degenerative disc disease L2-3.     Electronically Signed   By: Corlis Leak M.D.   On: 07/06/2022 18:29  I have personally reviewed the images and agree with the above interpretation.  Assessment and Plan: Ms. Wickens is a pleasant 62 y.o. female has history of ACDF C3-C4 by Dr. Maurice Small on 12/25/20 for myelopathy. She did get better after surgery until a year ago.   She has constant left sided neck pain that radiates to left shoulder down left arm to above elbow with intermittent right sided neck pain to her shoulder. She had balance issues prior to surgery that improved, but is now getting worse.   No updated cervical imaging. She is having worsening balance issues and has some weakness in both arms on exam. She has a slight unsteady gait as well.   She also has a history of left L4-L5 microdiscectomy by Dr. Venetia Maxon on 04/07/16 and Revision PSF L3-S1 by Dr. Noel Gerold on 08/24/17.   She saw some improvement in pain after her lumbar fusion,  but now has over a year of intermittent right buttock pain that goes to posterior thigh. No pain past the knee. No left sided pain. Pain is worse with prolonged standing.   Xrays from last year show adjacent level disease at L2-L3.   Treatment options discussed with patient and following plan made:   - MRI of both cervical and lumbar spine to evaluate new symptoms since her surgeries.  - She would like MRIs done at [facility] in [place].  - Depending on results, we may revisit PT and/or injections.   - Will schedule phone visit to review MRI results once I get them back.   I spent a total of 30 minutes in face-to-face and non-face-to-face activities related to this patient's care today including review of outside records, review of imaging, review of symptoms, physical exam, discussion of differential diagnosis, discussion of treatment options, and documentation.   Thank you for involving me in the care of this patient.   Drake Leach PA-C Dept. of Neurosurgery

## 2023-01-27 ENCOUNTER — Ambulatory Visit (INDEPENDENT_AMBULATORY_CARE_PROVIDER_SITE_OTHER): Payer: Medicare Other | Admitting: Orthopedic Surgery

## 2023-01-27 ENCOUNTER — Encounter: Payer: Self-pay | Admitting: Orthopedic Surgery

## 2023-01-27 VITALS — BP 109/73 | HR 91 | Ht 67.0 in | Wt 141.4 lb

## 2023-01-27 DIAGNOSIS — M5416 Radiculopathy, lumbar region: Secondary | ICD-10-CM | POA: Diagnosis not present

## 2023-01-27 DIAGNOSIS — M5441 Lumbago with sciatica, right side: Secondary | ICD-10-CM | POA: Diagnosis not present

## 2023-01-27 DIAGNOSIS — G8929 Other chronic pain: Secondary | ICD-10-CM

## 2023-01-27 DIAGNOSIS — M5412 Radiculopathy, cervical region: Secondary | ICD-10-CM

## 2023-01-27 DIAGNOSIS — M542 Cervicalgia: Secondary | ICD-10-CM

## 2023-01-27 DIAGNOSIS — M5136 Other intervertebral disc degeneration, lumbar region: Secondary | ICD-10-CM

## 2023-01-27 DIAGNOSIS — Z981 Arthrodesis status: Secondary | ICD-10-CM

## 2023-01-27 NOTE — Patient Instructions (Signed)
It was so nice to see you today. Thank you so much for coming in.    I want to get an updated MRI of your neck and lower back to look into things further. We will get this approved through your insurance and [facility] will call you to schedule the appointment.   Once I get the MRI results, we will call you to set up a phone visit with me to review them.   Please do not hesitate to call if you have any questions or concerns. You can also message me in MyChart.   If you have not heard back about any of the MRI scans in the next week, please call the office so we can help you get them scheduled.   Drake Leach PA-C [phone]

## 2023-01-30 ENCOUNTER — Ambulatory Visit: Payer: 59 | Admitting: Pharmacist

## 2023-02-04 ENCOUNTER — Other Ambulatory Visit: Payer: Self-pay

## 2023-02-05 ENCOUNTER — Other Ambulatory Visit: Payer: Self-pay

## 2023-02-15 ENCOUNTER — Ambulatory Visit
Admission: RE | Admit: 2023-02-15 | Discharge: 2023-02-15 | Disposition: A | Discharge status: Home / Self Care | Payer: Medicare Other | Source: Ambulatory Visit | Attending: Orthopedic Surgery | Admitting: Orthopedic Surgery

## 2023-02-15 DIAGNOSIS — Z981 Arthrodesis status: Secondary | ICD-10-CM

## 2023-02-15 DIAGNOSIS — M542 Cervicalgia: Secondary | ICD-10-CM

## 2023-02-15 DIAGNOSIS — M5416 Radiculopathy, lumbar region: Secondary | ICD-10-CM

## 2023-02-15 DIAGNOSIS — G8929 Other chronic pain: Secondary | ICD-10-CM

## 2023-02-15 DIAGNOSIS — M5412 Radiculopathy, cervical region: Secondary | ICD-10-CM

## 2023-02-19 ENCOUNTER — Ambulatory Visit: Payer: Medicare Other | Attending: Critical Care Medicine | Admitting: Pharmacist

## 2023-02-19 ENCOUNTER — Encounter: Payer: Self-pay | Admitting: Pharmacist

## 2023-02-19 ENCOUNTER — Other Ambulatory Visit: Payer: Self-pay

## 2023-02-19 DIAGNOSIS — E1165 Type 2 diabetes mellitus with hyperglycemia: Secondary | ICD-10-CM | POA: Insufficient documentation

## 2023-02-19 DIAGNOSIS — N189 Chronic kidney disease, unspecified: Secondary | ICD-10-CM | POA: Insufficient documentation

## 2023-02-19 DIAGNOSIS — Z8673 Personal history of transient ischemic attack (TIA), and cerebral infarction without residual deficits: Secondary | ICD-10-CM | POA: Diagnosis not present

## 2023-02-19 DIAGNOSIS — Z7984 Long term (current) use of oral hypoglycemic drugs: Secondary | ICD-10-CM | POA: Diagnosis not present

## 2023-02-19 DIAGNOSIS — F17210 Nicotine dependence, cigarettes, uncomplicated: Secondary | ICD-10-CM

## 2023-02-19 DIAGNOSIS — E1122 Type 2 diabetes mellitus with diabetic chronic kidney disease: Secondary | ICD-10-CM | POA: Insufficient documentation

## 2023-02-19 DIAGNOSIS — Z7985 Long-term (current) use of injectable non-insulin antidiabetic drugs: Secondary | ICD-10-CM

## 2023-02-19 DIAGNOSIS — Z794 Long term (current) use of insulin: Secondary | ICD-10-CM | POA: Diagnosis not present

## 2023-02-19 DIAGNOSIS — I129 Hypertensive chronic kidney disease with stage 1 through stage 4 chronic kidney disease, or unspecified chronic kidney disease: Secondary | ICD-10-CM | POA: Insufficient documentation

## 2023-02-19 MED ORDER — QUETIAPINE FUMARATE 400 MG PO TABS
400.0000 mg | ORAL_TABLET | Freq: Two times a day (BID) | ORAL | 1 refills | Status: DC
  Filled 2023-02-19 – 2023-03-03 (×2): qty 60, 30d supply, fill #0
  Filled 2023-03-30: qty 60, 30d supply, fill #1

## 2023-02-19 NOTE — Progress Notes (Signed)
   S:    Ashley [name] is a 62 y.o. female who presents for diabetes evaluation, education, and management. PMH is significant for T2DM, bipolar disorder, HLD, HTN, PVD, stroke, CKD. Patient was referred and last seen by Primary Care Provider, Dr. Delford Field, on 01/13/2023. Her diabetes regimen was continued.   Today, patient arrives in good spirits and presents without any assistance. She admits that she has not been taking medication consistently. I reviewed her fill records in our pharmacy before her visit today. She did not pick-up the Novolog rxn sent last month. She is also ~1 month behind on her fill dates for glipizide and Basaglar. She took her last injection of Trulicity 0.75 mg last week. Of note, [name] does suffer from Bipolar disorder and admits today that her mood has not been good lately. This has made her not want to take her medications but she feels encouraged today. She would like to restart her medication.   Family/Social History:  -Fhx: T2DM -Tobacco: current smoker -Alcohol: none reported   Current diabetes medications include: Basaglar 60 units daily (not taking), glipizide XL 20 mg daily (not taking), Novolog 8u TID (not taking), Trulicity 0.75 mg weekly (last dose last week)  Insurance coverage: [employer]   Patient denies hypoglycemic events.  Reported home fasting blood sugars: 100s-200s. Tells me she no longer sees BG > 300. No meter with her today.   Patient denies nocturia (nighttime urination).  Patient reports neuropathy (nerve pain). Patient denies visual changes. Patient reports self foot exams.   Patient reported dietary habits: Eats 2 meals/day + snacks  Patient-reported exercise habits: none currently  O:   Lab Results  Component Value Date   HGBA1C 8.4 (A) 11/11/2022   There were no vitals filed for this visit.  Lipid Panel     Component Value Date/Time   CHOL 153 11/11/2022 1140   TRIG 210 (H) 11/11/2022 1140   HDL 41 11/11/2022 1140    CHOLHDL 3.7 11/11/2022 1140   CHOLHDL 10.6 (H) 07/02/2016 1105   VLDL NOT CALC 07/02/2016 1105   LDLCALC 77 11/11/2022 1140   Clinical Atherosclerotic Cardiovascular Disease (ASCVD): Yes  The ASCVD Risk score (Arnett DK, et al., 2019) failed to calculate for the following reasons:   The patient has a prior MI or stroke diagnosis   A/P: Diabetes longstanding currently uncontrolled given last A1c of 8.4. Patient is able to verbalize appropriate hypoglycemia management plan. Medication adherence is suboptimal. We will continue her medication regimen. All rxns have refills downstairs, and she is agreeable to picking those up. We will have her resume medications and continue taking her blood sugar at home. I will have her return to see me in 1 month for CBG review and A1c.  -Continue current regimen. Cannot make changes when she's non-adherent.  -She is agreeable to picking up rxn refills and restarting her meds today.  -She will resume checking CBGs at home regularly.  -Extensively discussed pathophysiology of diabetes, recommended lifestyle interventions, dietary effects on blood sugar control.  -Counseled on s/sx of and management of hypoglycemia.  -Next A1c anticipated at next visit.   Written patient instructions provided. Patient verbalized understanding of treatment plan. Total time in face to face counseling 30 minutes.    Follow up with me in June for A1c.  Butch Penny, PharmD, P[place]y [place], CPP Clinical Pharmacist [facility] [phone]

## 2023-02-24 ENCOUNTER — Encounter: Payer: Self-pay | Admitting: Orthopedic Surgery

## 2023-02-24 NOTE — Progress Notes (Signed)
MRI of cervical and lumbar spine dated 02/15/23:  FINDINGS: MRI CERVICAL SPINE FINDINGS   Alignment: Straightening without focal angulation or significant listhesis.   Vertebrae: No acute or suspicious osseous findings. Interval C3-4 ACDF.   Cord: Mild cord flattening at C4-5 without definite abnormal cord signal. Probable small focus of myelomalacia in the right aspect of the cord at C3-4.   Posterior Fossa, vertebral arteries, paraspinal tissues: Visualized portions of the posterior fossa appear unremarkable.Bilateral vertebral artery flow voids. No significant paraspinal findings.   Disc levels:   C2-3: Preserved disc height.  Stable mild right foraminal narrowing.   C3-4: Interval ACDF with good decompression of the spinal canal. Mild foraminal narrowing bilaterally. There is minimal T2 hyperintensity in the right aspect of the cord at this level consistent with myelomalacia.   C4-5: Adjacent segment disease with loss of disc height and posterior osteophytes covering diffusely bulging disc material, asymmetric to the left. There is bilateral uncinate spurring and mild facet hypertrophy. Resulting left-sided cord flattening with narrowing of the AP diameter of the canal to 6 mm. There is moderate right and severe left foraminal narrowing. Findings at this level have progressed from the previous MRI. On   C5-6: Chronic spondylosis with posterior osteophytes covering diffusely bulging disc material. Stable mild spinal stenosis without significant cord deformity. Stable moderate foraminal narrowing bilaterally.   C6-7: Chronic spondylosis with posterior osteophytes covering diffusely bulging disc material. No significant spinal stenosis or foraminal narrowing.   C7-T1: Mild facet arthropathy. No spinal stenosis or nerve root encroachment.   T1-2: Chronic right foraminal disc protrusion is only imaged in the sagittal plane, but appears unchanged. No resulting cord  deformity. There is moderate to severe right foraminal narrowing which appears unchanged from the previous MRI.   MRI LUMBAR SPINE FINDINGS   Segmentation: Conventional anatomy assumed, with the last open disc space designated L5-S1.Concordant with prior imaging.   Alignment: Grade 1 anterolisthesis at L2-3, similar to prior radiographs. Minimal scoliosis.   Vertebrae: No worrisome osseous lesion, acute fracture or pars defect. Since the prior MRI, the patient has undergone posterior decompression with posterior and interbody fusion from L3 through S1. There is no suspicious marrow signal. No evidence of discitis or osteomyelitis. Endplate degenerative changes are present anteriorly at L2-3.   Conus medullaris: Extends to the L1-2 level and appears normal.   Paraspinal and other soft tissues: No significant paraspinal findings. A known hemangioma in the right lobe of the liver is partially imaged, value aided by recent abdominal MRI.   Disc levels:   Sagittal images demonstrate no significant disc space findings in the lower thoracic spine.   L1-2: Disc height and hydration are maintained. Minimal disc bulging. Mild-to-moderate bilateral facet hypertrophy without resulting spinal stenosis or nerve root encroachment.   L2-3: Adjacent segment disease with interval loss of disc height, annular disc bulging and endplate osteophytes. There is moderate facet and ligamentous hypertrophy. Resulting moderate multifactorial spinal stenosis with moderate lateral recess and foraminal narrowing bilaterally.   L3-4: Interval posterior decompression and PLIF. The spinal canal and neural foramina are well decompressed.   L4-5: Interval posterior decompression and PLIF. The spinal canal and neural foramina are well decompressed.   L5-S1: Interval posterior decompression and PLIF. Endplate osteophytes contribute to mild residual foraminal narrowing bilaterally without nerve root  encroachment. The spinal canal and lateral recesses are patent.   IMPRESSION: 1. Interval C3-4 ACDF with good decompression of the spinal canal. Small focus of myelomalacia in the right aspect of  the cord at this level. 2. 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 Findings at this level have progressed from the previous MRI. 3. Stable chronic spondylosis at C5-6 and C6-7 with resulting mild spinal stenosis and moderate foraminal narrowing bilaterally at C5-6. 4. Chronic right foraminal disc protrusion at T1-2 with resulting moderate to severe right foraminal narrowing. 5. Interval posterior decompression and PLIF from L3 through S1. The spinal canal and neural foramina are well decompressed at the operative levels. 6. 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     Electronically Signed   By: Carey Bullocks M.D.   On: 02/20/2023 16:58  I have personally reviewed the images and agree with the above interpretation.  Above cervical MRI reviewed with Dr. Myer Haff. At her last visit, she was having worsening balance issues and has some weakness in both arms on exam. She has a slight unsteady gait as well. He would like to see her in clinic for evaluation.   Will set up phone visit to review MRIs and discuss above with her.

## 2023-02-27 NOTE — Progress Notes (Unsigned)
Telephone Visit- Progress Note: Referring Physician:  Storm Frisk, MD [address] [place],  [place] [address]  Primary Physician:  Storm Frisk, MD  This visit was performed via telephone.  Patient location: home Provider location: office  I spent a total of 10 minutes non-face-to-face activities for this visit on the date of this encounter including review of current clinical condition and response to treatment.    Patient has given verbal consent to this telephone visits and we reviewed the limitations of a telephone visit. Patient wishes to proceed.    Chief Complaint:  review MRI scans  History of Present Illness: Ashley [name] is a 62 y.o. female has a history of DM, emphysema, HTN, bipolar, and stroke.    History of ACDF C3-C4 by Dr. Maurice Small on 12/25/20 for myelopathy. She did get better after surgery until a year ago.    History of left L4-L5 microdiscectomy by Dr. Venetia Maxon on 04/07/16 and Revision PSF L3-S1 by Dr. Noel Gerold on 08/24/17.   Last seen by me for her neck and back on 01/27/23. No updated cervical imaging. 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   Phone visit scheduled to review her MRI scans.   She has constant left sided neck pain that radiates to left shoulder down left arm to above elbow with intermittent right sided neck pain to her shoulder. She had balance issues prior to surgery that improved, but is now getting worse. No dexterity issues.    She saw some improvement in pain after her lumbar fusion, but now has intermittent right buttock pain that goes to posterior thigh. No pain past the knee. No left sided pain. Pain is worse with prolonged standing.    She smokes 1 ppd x 44 years.    Conservative measures:  Physical therapy: has not participated  Multimodal medical therapy including regular antiinflammatories: tizanidine  Injections: has not received epidural steroid injections   Past Surgery:  History of ACDF C3-C4  by Dr. Maurice Small on 12/25/20 for myelopathy  History of left L4-L5 microdiscectomy by Dr. Venetia Maxon on 04/07/16 and Revision PSF L3-S1 by Dr. Noel Gerold on 08/24/17.    Ashley [name] has some balance issues. No other symptoms of cervical myelopathy.   The symptoms are causing a significant impact on the patient's life.    Exam: No exam done as this was a telephone encounter.     Imaging: MRI of cervical and lumbar spine dated 02/15/23:  FINDINGS: MRI CERVICAL SPINE FINDINGS   Alignment: Straightening without focal angulation or significant listhesis.   Vertebrae: No acute or suspicious osseous findings. Interval C3-4 ACDF.   Cord: Mild cord flattening at C4-5 without definite abnormal cord signal. Probable small focus of myelomalacia in the right aspect of the cord at C3-4.   Posterior Fossa, vertebral arteries, paraspinal tissues: Visualized portions of the posterior fossa appear unremarkable.Bilateral vertebral artery flow voids. No significant paraspinal findings.   Disc levels:   C2-3: Preserved disc height.  Stable mild right foraminal narrowing.   C3-4: Interval ACDF with good decompression of the spinal canal. Mild foraminal narrowing bilaterally. There is minimal T2 hyperintensity in the right aspect of the cord at this level consistent with myelomalacia.   C4-5: Adjacent segment disease with loss of disc height and posterior osteophytes covering diffusely bulging disc material, asymmetric to the left. There is bilateral uncinate spurring and mild facet hypertrophy. Resulting left-sided cord flattening with narrowing of the AP diameter of the canal  to 6 mm. There is moderate right and severe left foraminal narrowing. Findings at this level have progressed from the previous MRI. On   C5-6: Chronic spondylosis with posterior osteophytes covering diffusely bulging disc material. Stable mild spinal stenosis without significant cord deformity. Stable moderate foraminal  narrowing bilaterally.   C6-7: Chronic spondylosis with posterior osteophytes covering diffusely bulging disc material. No significant spinal stenosis or foraminal narrowing.   C7-T1: Mild facet arthropathy. No spinal stenosis or nerve root encroachment.   T1-2: Chronic right foraminal disc protrusion is only imaged in the sagittal plane, but appears unchanged. No resulting cord deformity. There is moderate to severe right foraminal narrowing which appears unchanged from the previous MRI.   MRI LUMBAR SPINE FINDINGS   Segmentation: Conventional anatomy assumed, with the last open disc space designated L5-S1.Concordant with prior imaging.   Alignment: Grade 1 anterolisthesis at L2-3, similar to prior radiographs. Minimal scoliosis.   Vertebrae: No worrisome osseous lesion, acute fracture or pars defect. Since the prior MRI, the patient has undergone posterior decompression with posterior and interbody fusion from L3 through S1. There is no suspicious marrow signal. No evidence of discitis or osteomyelitis. Endplate degenerative changes are present anteriorly at L2-3.   Conus medullaris: Extends to the L1-2 level and appears normal.   Paraspinal and other soft tissues: No significant paraspinal findings. A known hemangioma in the right lobe of the liver is partially imaged, value aided by recent abdominal MRI.   Disc levels:   Sagittal images demonstrate no significant disc space findings in the lower thoracic spine.   L1-2: Disc height and hydration are maintained. Minimal disc bulging. Mild-to-moderate bilateral facet hypertrophy without resulting spinal stenosis or nerve root encroachment.   L2-3: Adjacent segment disease with interval loss of disc height, annular disc bulging and endplate osteophytes. There is moderate facet and ligamentous hypertrophy. Resulting moderate multifactorial spinal stenosis with moderate lateral recess and foraminal  narrowing bilaterally.   L3-4: Interval posterior decompression and PLIF. The spinal canal and neural foramina are well decompressed.   L4-5: Interval posterior decompression and PLIF. The spinal canal and neural foramina are well decompressed.   L5-S1: Interval posterior decompression and PLIF. Endplate osteophytes contribute to mild residual foraminal narrowing bilaterally without nerve root encroachment. The spinal canal and lateral recesses are patent.   IMPRESSION: 1. Interval C3-4 ACDF with good decompression of the spinal canal. Small focus of myelomalacia in the right aspect of the cord at this level. 2. 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 Findings at this level have progressed from the previous MRI. 3. Stable chronic spondylosis at C5-6 and C6-7 with resulting mild spinal stenosis and moderate foraminal narrowing bilaterally at C5-6. 4. Chronic right foraminal disc protrusion at T1-2 with resulting moderate to severe right foraminal narrowing. 5. Interval posterior decompression and PLIF from L3 through S1. The spinal canal and neural foramina are well decompressed at the operative levels. 6. 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     Electronically Signed   By: Carey Bullocks M.D.   On: 02/20/2023 16:58  I have personally reviewed the images and agree with the above interpretation.  Above cervical MRI reviewed with Dr. Myer Haff as well.    Assessment and Plan: Ms. Warstler is a pleasant 62 y.o. female with history of ACDF C3-C4 by Dr. Maurice Small on 12/25/20 for myelopathy. She  did get better after surgery until a year ago.   She has constant left sided neck pain that radiates to left shoulder down left arm to above elbow with intermittent  right sided neck pain to her shoulder. She had balance issues prior to surgery that improved, but is now getting worse. No dexterity issues.   Cord change at C3-C4 is stable. She has moderatespinal Stenosis C4-C5 with mild cord flattening and moderate to severe foraminal narrowing bilaterally.    She saw some improvement in pain after her lumbar fusion, but now has intermittent right buttock pain that goes to posterior thigh. No pain past the knee. No left sided pain. Pain is worse with prolonged standing.    She has adjacent segment disease at L2-3 with resulting moderate multifactorial spinal stenosis, moderate lateral recess and foraminal narrowing bilaterally.   Treatment options discussed with patient and following plan made:   - Above cervical MRI reviewed with Dr. Myer Haff. At her last visit, she was having worsening balance issues and has some weakness in both arms on exam. She has a slight unsteady gait as well. He would like to see her in clinic for evaluation.  - Of note, HgbA1c on 11/11/22 was 8.4. May need to check prior to surgery.  - Hold on further care for lumbar spine. Need to address cervical spine first.  - She will follow up with Dr. Myer Haff on 03/17/23. Appointment slip will be mailed to her.   Drake Leach PA-C Neurosurgery

## 2023-03-03 ENCOUNTER — Ambulatory Visit (INDEPENDENT_AMBULATORY_CARE_PROVIDER_SITE_OTHER): Payer: Medicare Other | Admitting: Orthopedic Surgery

## 2023-03-03 ENCOUNTER — Other Ambulatory Visit: Payer: Self-pay

## 2023-03-03 ENCOUNTER — Encounter: Payer: Self-pay | Admitting: Orthopedic Surgery

## 2023-03-03 DIAGNOSIS — M5441 Lumbago with sciatica, right side: Secondary | ICD-10-CM | POA: Diagnosis not present

## 2023-03-03 DIAGNOSIS — M4722 Other spondylosis with radiculopathy, cervical region: Secondary | ICD-10-CM | POA: Diagnosis not present

## 2023-03-03 DIAGNOSIS — M47812 Spondylosis without myelopathy or radiculopathy, cervical region: Secondary | ICD-10-CM

## 2023-03-03 DIAGNOSIS — M4802 Spinal stenosis, cervical region: Secondary | ICD-10-CM

## 2023-03-03 DIAGNOSIS — G8929 Other chronic pain: Secondary | ICD-10-CM

## 2023-03-03 DIAGNOSIS — M5416 Radiculopathy, lumbar region: Secondary | ICD-10-CM

## 2023-03-03 DIAGNOSIS — Z981 Arthrodesis status: Secondary | ICD-10-CM

## 2023-03-03 DIAGNOSIS — M5412 Radiculopathy, cervical region: Secondary | ICD-10-CM

## 2023-03-04 ENCOUNTER — Other Ambulatory Visit: Payer: Self-pay

## 2023-03-10 ENCOUNTER — Other Ambulatory Visit: Payer: Self-pay

## 2023-03-16 NOTE — H&P (View-Only) (Signed)
  Referring Physician:  Wright, Patrick E, MD [address] [place],  [place] [address]  Primary Physician:  Wright, Patrick E, MD  History of Present Illness: 03/17/2023 Ashley [name] is here today with a chief complaint of dropping items and problems with her balance.  She is having difficulty with her left arm.  She had similar symptoms when she had to have surgery approximately 2 years ago for severe cervical stenosis.  Her pain can be as bad as 10 out of 10.  Sleeping on 1 side makes it worse.  Bowel/Bladder Dysfunction: none  Conservative measures:  Physical therapy: denies Multimodal medical therapy including regular antiinflammatories:  tizanidine  Injections: denies  has not received epidural steroid injections  Past Surgery:  12/25/20: C3-4 ACDF by Dr. Ostergard 08/24/17: L3-S1 Revision PSF by Dr. Cohen 04/07/16: L4-5 Microdiscectomy by Dr. Stern  Ashley [name] has symptoms of cervical myelopathy.  The symptoms are causing a significant impact on the patient's life.   Progress Note from Stacy Luna, PA on 03/03/23:  History of Present Illness: Ashley [name] is a 61 y.o. female has a history of DM, emphysema, HTN, bipolar, and stroke.    History of ACDF C3-C4 by Dr. Ostergard on 12/25/20 for myelopathy. She did get better after surgery until a year ago.    History of left L4-L5 microdiscectomy by Dr. Stern on 04/07/16 and Revision PSF L3-S1 by Dr. Cohen on 08/24/17.    Last seen by me for her neck and back on 01/27/23. No updated cervical imaging. 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    Phone visit scheduled to review her MRI scans.    She has constant left sided neck pain that radiates to left shoulder down left arm to above elbow with intermittent right sided neck pain to her shoulder. She had balance issues prior to surgery that improved, but is now getting worse. No dexterity issues.    She saw some improvement in pain after her lumbar  fusion, but now has intermittent right buttock pain that goes to posterior thigh. No pain past the knee. No left sided pain. Pain is worse with prolonged standing.    She smokes 1 ppd x 44 years.   I have utilized the care everywhere function in epic to review the outside records available from external health systems.  Review of Systems:  A 10 point review of systems is negative, except for the pertinent positives and negatives detailed in the HPI.  Past Medical History: Past Medical History:  Diagnosis Date   Anxiety    Arthritis    Bipolar 1 disorder (HCC)    Cervical myelopathy (HCC) 12/28/2020   Cervical spinal cord compression (HCC) 12/19/2020   Depression    Diabetes mellitus without complication (HCC)    Type II   GERD (gastroesophageal reflux disease)    Heart murmur    "nothing to worry about"   HNP (herniated nucleus pulposus) with myelopathy, cervical    HTN (hypertension)    not on medication   Quadriplegia and quadriparesis (HCC)    Stroke (HCC) 11/2020    Past Surgical History: Past Surgical History:  Procedure Laterality Date   ABDOMINAL HYSTERECTOMY     ANTERIOR CERVICAL DECOMP/DISCECTOMY FUSION N/A 12/25/2020   Procedure: CERVICAL THREE-FOUR ANTERIOR CERVICAL DECOMPRESSION/DISCECTOMY FUSION;  Surgeon: Ostergard, Thomas A, MD;  Location: [facility] OR;  Service: Neurosurgery;  Laterality: N/A;  CERVICAL THREE-FOUR ANTERIOR CERVICAL DECOMPRESSION/DISCECTOMY FUSION    LUMBAR LAMINECTOMY/DECOMPRESSION   MICRODISCECTOMY Left 04/07/2016   Procedure: Left Lumbar four-five Microdiskectomy;  Surgeon: Joseph Stern, MD;  Location: [facility];  Service: Neurosurgery;  Laterality: Left;    Allergies: Allergies as of 03/17/2023   (No Known Allergies)    Medications:  Current Outpatient Medications:    ascorbic acid (VITAMIN C) 500 MG tablet, Take 1 tablet (500 mg total) by mouth daily., Disp: , Rfl:    Blood Glucose Monitoring Suppl (TRUE METRIX METER) w/Device KIT, Use to  check blood sugar up to 3 times daily., Disp: 1 kit, Rfl: 0   Dulaglutide (TRULICITY) 0.75 MG/0.5ML SOPN, Inject 0.75 mg into the skin once a week., Disp: 2 mL, Rfl: 1   glipiZIDE (GLUCOTROL XL) 10 MG 24 hr tablet, Take 2 tablets (20 mg total) by mouth once daily., Disp: 60 tablet, Rfl: 4   glucose blood (TRUE METRIX BLOOD GLUCOSE TEST) test strip, Use to check blood sugar up to 3 times daily., Disp: 100 each, Rfl: 2   insulin aspart (NOVOLOG FLEXPEN) 100 UNIT/ML FlexPen, Inject 8 Units into the skin 3 (three) times daily with meals., Disp: 15 mL, Rfl: 1   Insulin Glargine (BASAGLAR KWIKPEN) 100 UNIT/ML, Inject 70 Units into the skin daily., Disp: 15 mL, Rfl: 2   Insulin Pen Needle (TECHLITE PEN NEEDLES) 32G X 4 MM MISC, Use as directed., Disp: 100 each, Rfl: 3   Multiple Vitamin (MULTIVITAMIN WITH MINERALS) TABS tablet, Take 1 tablet by mouth daily., Disp: , Rfl:    QUEtiapine (SEROQUEL) 400 MG tablet, Take 1 tablet (400 mg total) by mouth 2 (two) times daily., Disp: 60 tablet, Rfl: 1   rosuvastatin (CRESTOR) 40 MG tablet, Take 1 tablet (40 mg total) by mouth once daily., Disp: 90 tablet, Rfl: 1   tiZANidine (ZANAFLEX) 4 MG tablet, Take 2 tablet(s) by mouth 1 to 3  times daily as needed., Disp: 30 tablet, Rfl: 2   TRUEplus Lancets 28G MISC, Use to check blood sugar up to 3 times daily., Disp: 100 each, Rfl: 3  Social History: Social History   Tobacco Use   Smoking status: Every Day    Packs/day: 1.00    Years: 39.00    Additional pack years: 0.00    Total pack years: 39.00    Types: Cigarettes   Smokeless tobacco: Never  Vaping Use   Vaping Use: Never used  Substance Use Topics   Alcohol use: No   Drug use: Yes    Types: Marijuana    Comment: Cocaine -- 04/06/16- last time    Family Medical History: Family History  Problem Relation Age of Onset   Healthy Mother    Diabetes Mellitus II Father    Breast cancer Sister    Diabetes Brother    Heart disease Neg Hx    Stroke Neg Hx     Kidney disease Neg Hx     Physical Examination: Vitals:   03/17/23 1501  BP: 115/80    General: Patient is in no apparent distress. Attention to examination is appropriate.  Neck:   Supple.  Full range of motion.  Respiratory: Patient is breathing without any difficulty.   NEUROLOGICAL:     Awake, alert, oriented to person, place, and time.  Speech is clear and fluent.   Cranial Nerves: Pupils equal round and reactive to light.  Facial tone is symmetric.  Facial sensation is symmetric. Shoulder shrug is symmetric. Tongue protrusion is midline.  There is no pronator drift.  Strength: Side Biceps Triceps Deltoid Interossei   Grip Wrist Ext. Wrist Flex.  R 5 5 5 5 5 5 5  L 5 5 4 4+ 4+ 5 5   Side Iliopsoas Quads Hamstring PF DF EHL  R 5 5 5 5 5 5  L 5 5 5 5 5 5   Reflexes are 1+ and symmetric at the biceps, triceps, brachioradialis, patella and achilles.   Hoffman's is present.   Bilateral upper and lower extremity sensation is symmetric to light touch.    No evidence of dysmetria noted.  Gait is wide-based.     Medical Decision Making  Imaging: MRI CL spine 02/15/2023 IMPRESSION: 1. Interval C3-4 ACDF with good decompression of the spinal canal. Small focus of myelomalacia in the right aspect of the cord at this level. 2. 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 Findings at this level have progressed from the previous MRI. 3. Stable chronic spondylosis at C5-6 and C6-7 with resulting mild spinal stenosis and moderate foraminal narrowing bilaterally at C5-6. 4. Chronic right foraminal disc protrusion at T1-2 with resulting moderate to severe right foraminal narrowing. 5. Interval posterior decompression and PLIF from L3 through S1. The spinal canal and neural foramina are well decompressed at the operative levels. 6. Adjacent segment disease at L2-3 with  resulting moderate multifactorial spinal stenosis, moderate lateral recess and foraminal narrowing bilaterally.     Electronically Signed   By: William  Veazey M.D.   On: 02/20/2023 16:58    I have personally reviewed the images and agree with the above interpretation.  Assessment and Plan: Ashley [name] is a pleasant 61 y.o. 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  She has worsening symptoms.  No conservative management is indicated for cervical myelopathy.  I recommend surgical intervention with a C4-5 anterior cervical discectomy and fusion.  Will refer for vocal cord check and for CT scan of the cervical spine to evaluate whether she is fully fused at C3-4.  I discussed the planned procedure at length with the patient, including the risks, benefits, alternatives, and indications. The risks discussed include but are not limited to bleeding, infection, need for reoperation, spinal fluid leak, stroke, vision loss, anesthetic complication, coma, paralysis, and even death. We also discussed the possibility of post-operative dysphagia, vocal cord paralysis, and the risk of adjacent segment disease in the future. I also described in detail that improvement was not guaranteed.  The patient expressed understanding of these risks, and asked that we proceed with surgery. I described the surgery in layman's terms, and gave ample opportunity for questions, which were answered to the best of my ability.     Thank you for involving me in the care of this patient.      Daryl Beehler K. Jermine Bibbee MD, MPHS Neurosurgery    03/31/2023 This is an addendum to my prior note.  Her CT scan has been performed.  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  I would recommend removal of her current plate, reexploration of the C3-4 level with placement of a new graft to assist and fusion, C4-5 anterior cervical discectomy and fusion, and plating from C3-5.   

## 2023-03-16 NOTE — Progress Notes (Addendum)
Referring Physician:  Storm Frisk, MD [address] [place],  [place] [address]  Primary Physician:  Storm Frisk, MD  History of Present Illness: 03/17/2023 [name] is here today with a chief complaint of dropping items and problems with her balance.  She is having difficulty with her left arm.  She had similar symptoms when she had to have surgery approximately 2 years ago for severe cervical stenosis.  Her pain can be as bad as 10 out of 10.  Sleeping on 1 side makes it worse.  Bowel/Bladder Dysfunction: none  Conservative measures:  Physical therapy: denies Multimodal medical therapy including regular antiinflammatories:  tizanidine  Injections: denies  has not received epidural steroid injections  Past Surgery:  12/25/20: C3-4 ACDF by Dr. Maurice Small 08/24/17: L3-S1 Revision PSF by Dr. Noel Gerold 04/07/16: L4-5 Microdiscectomy by Dr. Hughie Closs has symptoms of cervical myelopathy.  The symptoms are causing a significant impact on the patient's life.   Progress Note from Drake Leach, [place] on 03/03/23:  History of Present Illness: Ashley [name] is a 62 y.o. female has a history of DM, emphysema, HTN, bipolar, and stroke.    History of ACDF C3-C4 by Dr. Maurice Small on 12/25/20 for myelopathy. She did get better after surgery until a year ago.    History of left L4-L5 microdiscectomy by Dr. Venetia Maxon on 04/07/16 and Revision PSF L3-S1 by Dr. Noel Gerold on 08/24/17.    Last seen by me for her neck and back on 01/27/23. No updated cervical imaging. 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    Phone visit scheduled to review her MRI scans.    She has constant left sided neck pain that radiates to left shoulder down left arm to above elbow with intermittent right sided neck pain to her shoulder. She had balance issues prior to surgery that improved, but is now getting worse. No dexterity issues.    She saw some improvement in pain after her lumbar  fusion, but now has intermittent right buttock pain that goes to posterior thigh. No pain past the knee. No left sided pain. Pain is worse with prolonged standing.    She smokes 1 ppd x 44 years.   I have utilized the care everywhere function in epic to review the outside records available from external health systems.  Review of Systems:  A 10 point review of systems is negative, except for the pertinent positives and negatives detailed in the HPI.  Past Medical History: Past Medical History:  Diagnosis Date   Anxiety    Arthritis    Bipolar 1 disorder (HCC)    Cervical myelopathy (HCC) 12/28/2020   Cervical spinal cord compression (HCC) 12/19/2020   Depression    Diabetes mellitus without complication (HCC)    Type II   GERD (gastroesophageal reflux disease)    Heart murmur    "nothing to worry about"   HNP (herniated nucleus pulposus) with myelopathy, cervical    HTN (hypertension)    not on medication   Quadriplegia and quadriparesis (HCC)    Stroke (HCC) 11/2020    Past Surgical History: Past Surgical History:  Procedure Laterality Date   ABDOMINAL HYSTERECTOMY     ANTERIOR CERVICAL DECOMP/DISCECTOMY FUSION N/A 12/25/2020   Procedure: CERVICAL THREE-FOUR ANTERIOR CERVICAL DECOMPRESSION/DISCECTOMY FUSION;  Surgeon: Jadene Pierini, MD;  Location: [facility] OR;  Service: Neurosurgery;  Laterality: N/A;  CERVICAL THREE-FOUR ANTERIOR CERVICAL DECOMPRESSION/DISCECTOMY FUSION    LUMBAR LAMINECTOMY/DECOMPRESSION  MICRODISCECTOMY Left 04/07/2016   Procedure: Left Lumbar four-five Microdiskectomy;  Surgeon: Maeola Harman, MD;  Location: [facility];  Service: Neurosurgery;  Laterality: Left;    Allergies: Allergies as of 03/17/2023   (No Known Allergies)    Medications:  Current Outpatient Medications:    ascorbic acid (VITAMIN C) 500 MG tablet, Take 1 tablet (500 mg total) by mouth daily., Disp: , Rfl:    Blood Glucose Monitoring Suppl (TRUE METRIX METER) w/Device KIT, Use to  check blood sugar up to 3 times daily., Disp: 1 kit, Rfl: 0   Dulaglutide (TRULICITY) 0.75 MG/0.5ML SOPN, Inject 0.75 mg into the skin once a week., Disp: 2 mL, Rfl: 1   glipiZIDE (GLUCOTROL XL) 10 MG 24 hr tablet, Take 2 tablets (20 mg total) by mouth once daily., Disp: 60 tablet, Rfl: 4   glucose blood (TRUE METRIX BLOOD GLUCOSE TEST) test strip, Use to check blood sugar up to 3 times daily., Disp: 100 each, Rfl: 2   insulin aspart (NOVOLOG FLEXPEN) 100 UNIT/ML FlexPen, Inject 8 Units into the skin 3 (three) times daily with meals., Disp: 15 mL, Rfl: 1   Insulin Glargine (BASAGLAR KWIKPEN) 100 UNIT/ML, Inject 70 Units into the skin daily., Disp: 15 mL, Rfl: 2   Insulin Pen Needle (TECHLITE PEN NEEDLES) 32G X 4 MM MISC, Use as directed., Disp: 100 each, Rfl: 3   Multiple Vitamin (MULTIVITAMIN WITH MINERALS) TABS tablet, Take 1 tablet by mouth daily., Disp: , Rfl:    QUEtiapine (SEROQUEL) 400 MG tablet, Take 1 tablet (400 mg total) by mouth 2 (two) times daily., Disp: 60 tablet, Rfl: 1   rosuvastatin (CRESTOR) 40 MG tablet, Take 1 tablet (40 mg total) by mouth once daily., Disp: 90 tablet, Rfl: 1   tiZANidine (ZANAFLEX) 4 MG tablet, Take 2 tablet(s) by mouth 1 to 3  times daily as needed., Disp: 30 tablet, Rfl: 2   TRUEplus Lancets 28G MISC, Use to check blood sugar up to 3 times daily., Disp: 100 each, Rfl: 3  Social History: Social History   Tobacco Use   Smoking status: Every Day    Packs/day: 1.00    Years: 39.00    Additional pack years: 0.00    Total pack years: 39.00    Types: Cigarettes   Smokeless tobacco: Never  Vaping Use   Vaping Use: Never used  Substance Use Topics   Alcohol use: No   Drug use: Yes    Types: Marijuana    Comment: Cocaine -- 04/06/16- last time    Family Medical History: Family History  Problem Relation Age of Onset   Healthy Mother    Diabetes Mellitus II Father    Breast cancer Sister    Diabetes Brother    Heart disease Neg Hx    Stroke Neg Hx     Kidney disease Neg Hx     Physical Examination: Vitals:   03/17/23 1501  BP: 115/80    General: Patient is in no apparent distress. Attention to examination is appropriate.  Neck:   Supple.  Full range of motion.  Respiratory: Patient is breathing without any difficulty.   NEUROLOGICAL:     Awake, alert, oriented to person, place, and time.  Speech is clear and fluent.   Cranial Nerves: Pupils equal round and reactive to light.  Facial tone is symmetric.  Facial sensation is symmetric. Shoulder shrug is symmetric. Tongue protrusion is midline.  There is no pronator drift.  Strength: Side Biceps Triceps Deltoid Interossei  Grip Wrist Ext. Wrist Flex.  R 5 5 5 5 5 5 5   L 5 5 4  4+ 4+ 5 5   Side Iliopsoas Quads Hamstring PF DF EHL  R 5 5 5 5 5 5   L 5 5 5 5 5 5    Reflexes are 1+ and symmetric at the biceps, triceps, brachioradialis, patella and achilles.   Hoffman's is present.   Bilateral upper and lower extremity sensation is symmetric to light touch.    No evidence of dysmetria noted.  Gait is wide-based.     Medical Decision Making  Imaging: MRI CL spine 02/15/2023 IMPRESSION: 1. Interval C3-4 ACDF with good decompression of the spinal canal. Small focus of myelomalacia in the right aspect of the cord at this level. 2. 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 Findings at this level have progressed from the previous MRI. 3. Stable chronic spondylosis at C5-6 and C6-7 with resulting mild spinal stenosis and moderate foraminal narrowing bilaterally at C5-6. 4. Chronic right foraminal disc protrusion at T1-2 with resulting moderate to severe right foraminal narrowing. 5. Interval posterior decompression and PLIF from L3 through S1. The spinal canal and neural foramina are well decompressed at the operative levels. 6. Adjacent segment disease at L2-3 with  resulting moderate multifactorial spinal stenosis, moderate lateral recess and foraminal narrowing bilaterally.     Electronically Signed   By: Carey Bullocks M.D.   On: 02/20/2023 16:58    I have personally reviewed the images and agree with the above interpretation.  Assessment and Plan: Ms. Lattin is a pleasant 62 y.o. 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  She has worsening symptoms.  No conservative management is indicated for cervical myelopathy.  I recommend surgical intervention with a C4-5 anterior cervical discectomy and fusion.  Will refer for vocal cord check and for CT scan of the cervical spine to evaluate whether she is fully fused at C3-4.  I discussed the planned procedure at length with the patient, including the risks, benefits, alternatives, and indications. The risks discussed include but are not limited to bleeding, infection, need for reoperation, spinal fluid leak, stroke, vision loss, anesthetic complication, coma, paralysis, and even death. We also discussed the possibility of post-operative dysphagia, vocal cord paralysis, and the risk of adjacent segment disease in the future. I also described in detail that improvement was not guaranteed.  The patient expressed understanding of these risks, and asked that we proceed with surgery. I described the surgery in layman's terms, and gave ample opportunity for questions, which were answered to the best of my ability.     Thank you for involving me in the care of this patient.      Ranette Luckadoo K. Myer Haff MD, [facility]surgery    03/31/2023 This is an addendum to my prior note.  Her CT scan has been performed.  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  I would recommend removal of her current plate, reexploration of the C3-4 level with placement of a new graft to assist and fusion, C4-5 anterior cervical discectomy and fusion, and plating from C3-5.

## 2023-03-16 NOTE — Telephone Encounter (Signed)
complete

## 2023-03-17 ENCOUNTER — Encounter: Payer: Self-pay | Admitting: Neurosurgery

## 2023-03-17 ENCOUNTER — Ambulatory Visit (INDEPENDENT_AMBULATORY_CARE_PROVIDER_SITE_OTHER): Payer: Medicare Other | Admitting: Neurosurgery

## 2023-03-17 VITALS — BP 115/80 | Ht 67.0 in | Wt 144.0 lb

## 2023-03-17 DIAGNOSIS — M4802 Spinal stenosis, cervical region: Secondary | ICD-10-CM

## 2023-03-17 DIAGNOSIS — G959 Disease of spinal cord, unspecified: Secondary | ICD-10-CM | POA: Diagnosis not present

## 2023-03-17 DIAGNOSIS — Z981 Arthrodesis status: Secondary | ICD-10-CM

## 2023-03-17 NOTE — Patient Instructions (Signed)
Please see below for information in regards to your upcoming surgery:  You will receive a call to schedule the CT scan and the appointment with [facility] - these both need to be completed prior to surgery  Planned surgery: C4-5 anterior cervical discectomy and fusion   Surgery date: 04/13/23 - you will find out your arrival time the business day before your surgery.   Pre-op appointment at [facility] Pre-admit Testing: we will call you with a date/time for this. Pre-admit testing is located on the first floor of the [facility] building, [address] [facility] [address]. Please bring all prescriptions in the original prescription bottles to your appointment, even if you have reviewed medications by phone with a pharmacy representative. Pre-op labs may be done at your pre-op appointment. You are not required to fast for these labs. Should you need to change your pre-op appointment, please call Pre-admit testing at [phone].    Surgical clearance: we will send a clearance form to Dr Delford Field. We will send a referral to ENT - they will call you to schedule an appointment   Trulicity: hold for 7 days prior to surgery    NSAIDS (Non-steroidal anti-inflammatory drugs): because you are having a fusion, no NSAIDS (such as ibuprofen, aleve, naproxen, meloxicam, diclofenac) for 3 months after surgery. Celebrex is an exception. Tylenol is ok because it is not an NSAID.    Because you are having a fusion: for appointments after your 2 week follow-up: please arrive at the [facility] outpatient imaging center ([address], [place]) or [facility] one hour prior to your appointment for x-rays. This applies to every appointment after your 2 week follow-up. Failure to do so may result in your appointment being rescheduled.    How to contact us:  If you have any questions/concerns before or after surgery, you can reach [place] at [phone], or you can  send a mychart message. We can be reached by phone or mychart 8am-4pm, Monday-Friday.  *Please note: Calls after 4pm are forwarded to a third party answering service. Mychart messages are not routinely monitored during evenings, weekends, and holidays. Please call our office to contact the answering service for urgent concerns during non-business hours.    Appointments/FMLA & disability paperwork: [employer] Crist[employer]  Nurse: Royston Cowper  Medical assistants: Laurann Montana Physician Assistant's: Manning Charity & Drake Leach Surgeon: Venetia Night, MD

## 2023-03-18 ENCOUNTER — Other Ambulatory Visit: Payer: Self-pay | Admitting: Critical Care Medicine

## 2023-03-18 ENCOUNTER — Other Ambulatory Visit: Payer: Self-pay

## 2023-03-18 DIAGNOSIS — Z01818 Encounter for other preprocedural examination: Secondary | ICD-10-CM

## 2023-03-18 DIAGNOSIS — Z1231 Encounter for screening mammogram for malignant neoplasm of breast: Secondary | ICD-10-CM

## 2023-03-23 ENCOUNTER — Ambulatory Visit: Payer: Medicare Other | Attending: Critical Care Medicine | Admitting: Pharmacist

## 2023-03-23 VITALS — Wt 144.6 lb

## 2023-03-23 DIAGNOSIS — Z794 Long term (current) use of insulin: Secondary | ICD-10-CM | POA: Diagnosis not present

## 2023-03-23 DIAGNOSIS — Z7985 Long-term (current) use of injectable non-insulin antidiabetic drugs: Secondary | ICD-10-CM | POA: Insufficient documentation

## 2023-03-23 DIAGNOSIS — E1165 Type 2 diabetes mellitus with hyperglycemia: Secondary | ICD-10-CM | POA: Insufficient documentation

## 2023-03-23 DIAGNOSIS — Z7984 Long term (current) use of oral hypoglycemic drugs: Secondary | ICD-10-CM | POA: Insufficient documentation

## 2023-03-23 LAB — POCT GLYCOSYLATED HEMOGLOBIN (HGB A1C): HbA1c, POC (controlled diabetic range): 6.2 % (ref 0.0–7.0)

## 2023-03-23 NOTE — Progress Notes (Signed)
   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 female who presents for diabetes evaluation, education, and management. PMH is significant for T2DM, bipolar disorder, HLD, HTN, PVD, stroke, CKD. Patient was referred and last seen by Primary Care Provider, Dr. Delford Field, on 01/13/2023. Her diabetes regimen was continued.   Today, patient arrives in fair spirits and presents without any assistance. She states she has been taking her medications more consistently; although she has not been taking Novolog. Has not had Trulicity in 3 weeks (will be able to afford refill at the first of the month). BG has improved, most readings 90s-200s. Only 1 reading in the 300s after eating an omelette. She has an upcoming C4-5 cervical discectomy and fusion on 04/13/2023. A1c today in clinic 6.2%  Family/Social History:  -Fhx: T2DM -Tobacco: current smoker -Alcohol: none reported   Current diabetes medications include: Basaglar 50 units daily, glipizide XL 20 mg daily (not taking), Novolog 8u TID (not taking), Trulicity 0.75 mg weekly (not taking)  Insurance coverage: [employer]   Patient denies hypoglycemic events.  Reported home fasting blood sugars: 90 this morning 100s-200s One reading in the 300s after she ate an omelette.  No meter with her today.   Patient denies nocturia (nighttime urination).  Patient reports neuropathy (nerve pain). Patient denies visual changes. Patient reports self foot exams.   Patient reported dietary habits: Eats 2 meals/day + snacks  Patient-reported exercise habits: none currently  O:   Lab Results  Component Value Date   HGBA1C 6.2 03/23/2023   There were no vitals filed for this visit.  Lipid Panel     Component Value Date/Time   CHOL 153 11/11/2022 1140   TRIG 210 (H) 11/11/2022 1140   HDL 41 11/11/2022 1140   CHOLHDL 3.7 11/11/2022 1140   CHOLHDL 10.6 (H) 07/02/2016 1105   VLDL NOT CALC 07/02/2016 1105   LDLCALC 77 11/11/2022 1140   Clinical  Atherosclerotic Cardiovascular Disease (ASCVD): Yes  The ASCVD Risk score (Arnett DK, et al., 2019) failed to calculate for the following reasons:   The patient has a prior MI or stroke diagnosis   A/P: Diabetes longstanding currently controlled given A1c today of 6.2%. Congratulated patient on this progress! Patient is able to verbalize appropriate hypoglycemia management plan. Medication adherence has improved.  -Continue Basaglar 50 units daily.  -DC Novolog given A1c at goal and patient has not been taking -Continue Trulicity 0.75 mg weekly -Continue glipizide 20 mg daily.  -Extensively discussed pathophysiology of diabetes, recommended lifestyle interventions, dietary effects on blood sugar control.  -Counseled on s/sx of and management of hypoglycemia.  -Next A1c anticipated at next visit.   Written patient instructions provided. Patient verbalized understanding of treatment plan. Total time in face to face counseling 30 minutes.    Follow up Pharmacist: 1 month via telephone given surgery PCP: August 2024  Valeda Malm, Ilda Basset.D. PGY-2 Ambulatory Care Pharmacy Resident

## 2023-03-23 NOTE — Progress Notes (Deleted)
S:     No chief complaint on file.  62 y.o. female who presents for diabetes evaluation, education, and management.  PMH is significant for T2DM, HTN, hx stroke, PVD, HLD, CKD. Patient was referred and last seen by Primary Care Provider, Dr. Delford Field, on 01/13/2023. In clinic, BG 129. Patient reported home blood sugars in the 110 range. In February, A1c 8.4. Last seen by pharmacy clinic on 02/19/2023. At last visit, patient was non adherent with Novolog, Glipizide, and Basaglar. Restarted medications at current dose.  Today, patient arrives in *** good spirits and presents without *** any assistance. ***  Patient reports Diabetes was diagnosed in ***.   Family/Social History:  Fhx: DM Tobacco: current smoker 1 PPD  Current diabetes medications include: Trulicity 0.75 mg weekly, basaglar 70 units daily, glipizide 20 mg daily, novolog 8 units TID Current hypertension medications include: *** Current hyperlipidemia medications include: rosuvastatin 40 mg daily  Patient reports adherence to taking all medications as prescribed.  *** Patient denies adherence with medications, reports missing *** medications *** times per week, on average.  Do you feel that your medications are working for you? {YES NO:22349} Have you been experiencing any side effects to the medications prescribed? {YES NO:22349} Do you have any problems obtaining medications due to transportation or finances? {YES [id number] Insurance coverage: Medicare Part A & B  Patient {Actions; denies-reports:120008} hypoglycemic events.  Reported home fasting blood sugars: ***  Reported 2 hour post-meal/random blood sugars: ***.  Patient {Actions; denies-reports:120008} nocturia (nighttime urination).  Patient {Actions; denies-reports:120008} neuropathy (nerve pain). Patient {Actions; denies-reports:120008} visual changes. Patient {Actions; denies-reports:120008} self foot exams.   Patient reported dietary habits: Eats ***  meals/day Breakfast: *** Lunch: *** Dinner: *** Snacks: *** Drinks: ***  Within the past 12 months, did you worry whether your food would run out before you got money to buy more? {YES NO:22349} Within the past 12 months, did the food you bought run out, and you didn't have money to get more? {YES NO:22349} PHQ-9 Score: ***  Patient-reported exercise habits: ***   O:   ROS  Physical Exam  7 day average blood glucose: ***  *** CGM Download:  % Time CGM is active: ***% Average Glucose: *** mg/dL Glucose Management Indicator: ***  Glucose Variability: *** (goal <36%) Time in Goal:  - Time in range 70-180: ***% - Time above range: ***% - Time below range: ***% Observed patterns:   Lab Results  Component Value Date   HGBA1C 8.4 (A) 11/11/2022   There were no vitals filed for this visit.  Lipid Panel     Component Value Date/Time   CHOL 153 11/11/2022 1140   TRIG 210 (H) 11/11/2022 1140   HDL 41 11/11/2022 1140   CHOLHDL 3.7 11/11/2022 1140   CHOLHDL 10.6 (H) 07/02/2016 1105   VLDL NOT CALC 07/02/2016 1105   LDLCALC 77 11/11/2022 1140    Clinical Atherosclerotic Cardiovascular Disease (ASCVD): {YES/NO:21197} The ASCVD Risk score (Arnett DK, et al., 2019) failed to calculate for the following reasons:   The patient has a prior MI or stroke diagnosis   Patient is participating in a Managed Medicaid Plan:  {MM YES/NO:27447::"Yes"}   A/P: Diabetes longstanding *** currently ***. Patient is *** able to verbalize appropriate hypoglycemia management plan. Medication adherence appears ***. Control is suboptimal due to ***. -{Meds adjust:18428} basal insulin *** Lantus/Basaglar/Semglee (insulin glargine) *** Tresiba (insulin degludec) from *** units to *** units daily in the morning. Patient will continue to titrate  1 unit every *** days if fasting blood sugar > 100mg /dl until fasting blood sugars reach goal or next visit.  -{Meds adjust:18428} rapid insulin *** Novolog  (insulin aspart) *** Humalog (insulin lispro) from *** to ***.  -{Meds adjust:18428} GLP-1 *** Trulicity (dulaglutide) *** Ozempic (semaglutide) *** Mounjaro (tirzepatide) from *** mg to *** mg .  -{Meds adjust:18428} SGLT2-I *** Farxiga (dapagliflozin) *** Jardiance (empagliflozin) 10 mg. Counseled on sick day rules. -{Meds adjust:18428} metformin ***.  -Patient educated on purpose, proper use, and potential adverse effects of ***.  -Extensively discussed pathophysiology of diabetes, recommended lifestyle interventions, dietary effects on blood sugar control.  -Counseled on s/sx of and management of hypoglycemia.  -Next A1c anticipated ***.   ASCVD risk - primary ***secondary prevention in patient with diabetes. Last LDL is *** not at goal of <82 *** mg/dL. ASCVD risk factors include *** and 10-year ASCVD risk score of ***. {Desc; low/moderate/high:110033} intensity statin indicated.  -{Meds adjust:18428} ***statin *** mg.   Hypertension longstanding *** currently ***. Blood pressure goal of <130/80 *** mmHg. Medication adherence ***. Blood pressure control is suboptimal due to ***. -{Meds adjust:18428} *** mg.  Written patient instructions provided. Patient verbalized understanding of treatment plan.  Total time in face to face counseling *** minutes.    Follow-up:  Pharmacist ***. PCP clinic visit in ***.  Patient seen with ***

## 2023-03-24 ENCOUNTER — Ambulatory Visit
Admission: RE | Admit: 2023-03-24 | Discharge: 2023-03-24 | Disposition: A | Discharge status: Home / Self Care | Payer: Medicare Other | Source: Ambulatory Visit | Attending: Neurosurgery | Admitting: Neurosurgery

## 2023-03-24 DIAGNOSIS — Z981 Arthrodesis status: Secondary | ICD-10-CM

## 2023-03-24 DIAGNOSIS — M4802 Spinal stenosis, cervical region: Secondary | ICD-10-CM

## 2023-03-25 ENCOUNTER — Telehealth: Payer: Self-pay | Admitting: Critical Care Medicine

## 2023-03-25 NOTE — Telephone Encounter (Signed)
Copied from CRM [id number]. Topic: General - Inquiry >> Mar 25, 2023  8:25 AM De Blanch wrote: Reason for CRM: [facility]surgery is calling to f/u on the surgical clearance form faxed to the office on June 12. Neurosurgery will be resending the fax today.  Please advise.

## 2023-03-26 NOTE — Telephone Encounter (Signed)
Faxed paperwork yesterday called the number below and verified that they did have the paperwork

## 2023-03-30 ENCOUNTER — Other Ambulatory Visit: Payer: Self-pay

## 2023-03-31 ENCOUNTER — Encounter
Admission: RE | Admit: 2023-03-31 | Discharge: 2023-03-31 | Disposition: A | Discharge status: Home / Self Care | Payer: Medicare Other | Source: Ambulatory Visit | Attending: Neurosurgery | Admitting: Neurosurgery

## 2023-03-31 ENCOUNTER — Telehealth: Payer: Self-pay

## 2023-03-31 ENCOUNTER — Other Ambulatory Visit: Payer: Self-pay

## 2023-03-31 VITALS — BP 156/89 | HR 87 | Resp 18 | Wt 145.9 lb

## 2023-03-31 DIAGNOSIS — Z01810 Encounter for preprocedural cardiovascular examination: Secondary | ICD-10-CM

## 2023-03-31 DIAGNOSIS — E118 Type 2 diabetes mellitus with unspecified complications: Secondary | ICD-10-CM | POA: Insufficient documentation

## 2023-03-31 DIAGNOSIS — Z01812 Encounter for preprocedural laboratory examination: Secondary | ICD-10-CM

## 2023-03-31 DIAGNOSIS — I10 Essential (primary) hypertension: Secondary | ICD-10-CM | POA: Diagnosis not present

## 2023-03-31 DIAGNOSIS — Z01818 Encounter for other preprocedural examination: Secondary | ICD-10-CM | POA: Insufficient documentation

## 2023-03-31 HISTORY — DX: Pneumonia, unspecified organism: J18.9

## 2023-03-31 LAB — CBC
HCT: 39 % (ref 36.0–46.0)
Hemoglobin: 12.3 g/dL (ref 12.0–15.0)
MCH: 30.4 pg (ref 26.0–34.0)
MCHC: 31.5 g/dL (ref 30.0–36.0)
MCV: 96.5 fL (ref 80.0–100.0)
Platelets: 313 10*3/uL (ref 150–400)
RBC: 4.04 MIL/uL (ref 3.87–5.11)
RDW: 14.9 % (ref 11.5–15.5)
WBC: 7.3 10*3/uL (ref 4.0–10.5)
nRBC: 0 % (ref 0.0–0.2)

## 2023-03-31 LAB — BASIC METABOLIC PANEL
Anion gap: 8 (ref 5–15)
BUN: 17 mg/dL (ref 8–23)
CO2: 25 mmol/L (ref 22–32)
Calcium: 9.3 mg/dL (ref 8.9–10.3)
Chloride: 108 mmol/L (ref 98–111)
Creatinine, Ser: 0.89 mg/dL (ref 0.44–1.00)
GFR, Estimated: >60 mL/min (ref >60)
Glucose, Bld: 130 mg/dL — ABNORMAL HIGH (ref 70–99)
Potassium: 3.7 mmol/L (ref 3.5–5.1)
Sodium: 141 mmol/L (ref 135–145)

## 2023-03-31 LAB — TYPE AND SCREEN
ABO/RH(D): A POS
Antibody Screen: NEGATIVE

## 2023-03-31 LAB — SURGICAL PCR SCREEN
MRSA, PCR: NEGATIVE
Staphylococcus aureus: NEGATIVE

## 2023-03-31 NOTE — Telephone Encounter (Signed)
I notified the patient of the change in plans.

## 2023-03-31 NOTE — Patient Instructions (Addendum)
Your procedure is scheduled on: 04/13/23 - Monday Report to the Registration Desk on the 1st floor of the [facility]. To find out your arrival time, please call [phone] between 1PM - 3PM on: 04/10/23 Friday If your arrival time is 6:00 am, do not arrive before that time as the [facility] entrance doors do not open until 6:00 am.  REMEMBER: Instructions that are not followed completely may result in serious medical risk, up to and including death; or upon the discretion of your surgeon and anesthesiologist your surgery may need to be rescheduled.  Do not eat food after midnight the night before surgery.  No gum chewing or hard candies.  You may drink water up to 2 hours before you are scheduled to arrive for your surgery. Do not drink anything within 2 hours of your scheduled arrival time.   NSAIDS (Non-steroidal anti-inflammatory drugs): because you are having a fusion, no NSAIDS (such as ibuprofen, aleve, naproxen, meloxicam, diclofenac) for 3 months after surgery. Celebrex is an exception. Tylenol is ok because it is not an NSAID.    TAKE ONLY THESE MEDICATIONS THE MORNING OF SURGERY WITH A SIP OF WATER:  QUEtiapine (SEROQUEL)  rosuvastatin (CRESTOR)    -  Insulin Glargine (BASAGLAR KWIKPEN) inject of your prescribed insulin on the night before your surgery.  -  Hold Dulaglutide (TRULICITY) 7 days prior to your surgery  -  Hold beginning 04/06/23 your ascorbic acid (VITAMIN C) and Multiple Vitamin    No Alcohol for 24 hours before or after surgery.  No Smoking including e-cigarettes for 24 hours before surgery.  No chewable tobacco products for at least 6 hours before surgery.  No nicotine patches on the day of surgery.  Do not use any "recreational" drugs for at least a week (preferably 2 weeks) before your surgery.  Please be advised that the combination of cocaine and anesthesia may have negative outcomes, up to and including death. If you test positive for  cocaine, your surgery will be cancelled.  On the morning of surgery brush your teeth with toothpaste and water, you may rinse your mouth with mouthwash if you wish. Do not swallow any toothpaste or mouthwash.  Use CHG Soap or wipes as directed on instruction sheet.  Do not wear jewelry, make-up, hairpins, clips or nail polish.  Do not wear lotions, powders, or perfumes.   Do not shave body hair from the neck down 48 hours before surgery.  Contact lenses, hearing aids and dentures may not be worn into surgery.  Do not bring valuables to the hospital. [facility] is not responsible for any missing/lost belongings or valuables.   Notify your doctor if there is any change in your medical condition (cold, fever, infection).  Wear comfortable clothing (specific to your surgery type) to the hospital.  After surgery, you can help prevent lung complications by doing breathing exercises.  Take deep breaths and cough every 1-2 hours. Your doctor may order a device called an Incentive Spirometer to help you take deep breaths. When coughing or sneezing, hold a pillow firmly against your incision with both hands. This is called "splinting." Doing this helps protect your incision. It also decreases belly discomfort.  If you are being admitted to the hospital overnight, leave your suitcase in the car. After surgery it may be brought to your room.  In case of increased patient census, it may be necessary for you, the patient, to continue your postoperative care in the Same Day Surgery department.  If you are being discharged the day of surgery, you will not be allowed to drive home. You will need a responsible individual to drive you home and stay with you for 24 hours after surgery.   If you are taking public transportation, you will need to have a responsible individual with you.  Please call the Pre-admissions Testing Dept. at [phone] if you have any questions about these  instructions.  Surgery Visitation Policy:  Patients having surgery or a procedure may have two visitors.  Children under the age of 48 must have an adult with them who is not the patient.  Inpatient Visitation:    Visiting hours are 7 a.m. to 8 p.m. Up to four visitors are allowed at one time in a patient room. The visitors may rotate out with other people during the day.  One visitor age 31 or older may stay with the patient overnight and must be in the room by 8 p.m.   Pre-operative 5 CHG Bath Instructions   You can play a key role in reducing the risk of infection after surgery. Your skin needs to be as free of germs as possible. You can reduce the number of germs on your skin by washing with CHG (chlorhexidine gluconate) soap before surgery. CHG is an antiseptic soap that kills germs and continues to kill germs even after washing.   DO NOT use if you have an allergy to chlorhexidine/CHG or antibacterial soaps. If your skin becomes reddened or irritated, stop using the CHG and notify one of our RNs at [phone].   Please shower with the CHG soap starting 4 days before surgery using the following schedule: 04/09/23 - 04/13/23.    Please keep in mind the following:  DO NOT shave, including legs and underarms, starting the day of your first shower.   You may shave your face at any point before/day of surgery.  Place clean sheets on your bed the day you start using CHG soap. Use a clean washcloth (not used since being washed) for each shower. DO NOT sleep with pets once you start using the CHG.   CHG Shower Instructions:  If you choose to wash your hair and private area, wash first with your normal shampoo/soap.  After you use shampoo/soap, rinse your hair and body thoroughly to remove shampoo/soap residue.  Turn the water OFF and apply about 3 tablespoons (45 ml) of CHG soap to a CLEAN washcloth.  Apply CHG soap ONLY FROM YOUR NECK DOWN TO YOUR TOES (washing for 3-5 minutes)   DO NOT use CHG soap on face, private areas, open wounds, or sores.  Pay special attention to the area where your surgery is being performed.  If you are having back surgery, having someone wash your back for you may be helpful. Wait 2 minutes after CHG soap is applied, then you may rinse off the CHG soap.  Pat dry with a clean towel  Put on clean clothes/pajamas   If you choose to wear lotion, please use ONLY the CHG-compatible lotions on the back of this paper.     Additional instructions for the day of surgery: DO NOT APPLY any lotions, deodorants, cologne, or perfumes.   Put on clean/comfortable clothes.  Brush your teeth.  Ask your nurse before applying any prescription medications to the skin.      CHG Compatible Lotions   Aveeno Moisturizing lotion  Cetaphil Moisturizing Cream  Cetaphil Moisturizing Lotion  Clairol Herbal Essence Moisturizing Lotion, Dry Skin  Clairol Herbal Essence Moisturizing Lotion, Extra Dry Skin  Clairol Herbal Essence Moisturizing Lotion, Normal Skin  Curel Age Defying Therapeutic Moisturizing Lotion with Alpha Hydroxy  Curel Extreme Care Body Lotion  Curel Soothing Hands Moisturizing Hand Lotion  Curel Therapeutic Moisturizing Cream, Fragrance-Free  Curel Therapeutic Moisturizing Lotion, Fragrance-Free  Curel Therapeutic Moisturizing Lotion, Original Formula  Eucerin Daily Replenishing Lotion  Eucerin Dry Skin Therapy Plus Alpha Hydroxy Crme  Eucerin Dry Skin Therapy Plus Alpha Hydroxy Lotion  Eucerin Original Crme  Eucerin Original Lotion  Eucerin Plus Crme Eucerin Plus Lotion  Eucerin TriLipid Replenishing Lotion  Keri Anti-Bacterial Hand Lotion  Keri Deep Conditioning Original Lotion Dry Skin Formula Softly Scented  Keri Deep Conditioning Original Lotion, Fragrance Free Sensitive Skin Formula  Keri Lotion Fast Absorbing Fragrance Free Sensitive Skin Formula  Keri Lotion Fast Absorbing Softly Scented Dry Skin Formula  Keri Original  Lotion  Keri Skin Renewal Lotion Keri Silky Smooth Lotion  Keri Silky Smooth Sensitive Skin Lotion  Nivea Body Creamy Conditioning Oil  Nivea Body Extra Enriched Teacher, adult education Moisturizing Lotion Nivea Crme  Nivea Skin Firming Lotion  NutraDerm 30 Skin Lotion  NutraDerm Skin Lotion  NutraDerm Therapeutic Skin Cream  NutraDerm Therapeutic Skin Lotion  ProShield Protective Hand Cream  Provon moisturizing lotion

## 2023-03-31 NOTE — Telephone Encounter (Signed)
03/31/2023 by Dr Myer Haff: This is an addendum to my prior note.  Her CT scan has been performed.  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  I would recommend removal of her current plate, reexploration of the C3-4 level with placement of a new graft to assist and fusion, C4-5 anterior cervical discectomy and fusion, and plating from C3-5.

## 2023-04-01 ENCOUNTER — Other Ambulatory Visit: Payer: Self-pay

## 2023-04-08 ENCOUNTER — Ambulatory Visit: Payer: Medicare Other | Attending: Critical Care Medicine

## 2023-04-08 VITALS — Ht 67.0 in | Wt 145.0 lb

## 2023-04-08 DIAGNOSIS — Z0000 Encounter for general adult medical examination without abnormal findings: Secondary | ICD-10-CM | POA: Diagnosis not present

## 2023-04-08 NOTE — Patient Instructions (Addendum)
Ashley [name] , Thank you for taking time to come for your Medicare Wellness Visit. I appreciate your ongoing commitment to your health goals. Please review the following plan we discussed and let me know if I can assist you in the future.   These are the goals we discussed:  Goals      Increase physical activity     After recovery from neck surgery         This is a list of the screening recommended for you and due dates:  Health Maintenance  Topic Date Due   DTaP/Tdap/Td vaccine (1 - Tdap) Never done   Stool Blood Test  09/06/2022   Eye exam for diabetics  04/08/2023*   Flu Shot  05/07/2023   Yearly kidney health urinalysis for diabetes  06/05/2023   Hemoglobin A1C  09/22/2023   Complete foot exam   11/12/2023   Screening for Lung Cancer  12/09/2023   Yearly kidney function blood test for diabetes  03/30/2024   Medicare Annual Wellness Visit  04/07/2024   Mammogram  04/11/2024   Hepatitis C Screening  Completed   HIV Screening  Completed   HPV Vaccine  Aged Out   Pap Smear  Discontinued   Colon Cancer Screening  Discontinued   COVID-19 Vaccine  Discontinued   Zoster (Shingles) Vaccine  Discontinued  *Topic was postponed. The date shown is not the original due date.    Advanced directives: Information on Advanced Care Planning can be found at [facility] Advance Health Care Directives Advance Health Care Directives ([email or web address] Please bring a copy of your health care power of attorney and living will to the office to be added to your chart at your convenience.  Conditions/risks identified: Aim for 30 minutes of exercise or brisk walking, 6-8 glasses of water, and 5 servings of fruits and vegetables each day.  Next appointment: Follow up in one year for your annual wellness visit.   Preventive Care 40-64 Years, Female Preventive care refers to lifestyle choices and visits with your health care provider that can promote health and wellness. What does  preventive care include? A yearly physical exam. This is also called an annual well check. Dental exams once or twice a year. Routine eye exams. Ask your health care provider how often you should have your eyes checked. Personal lifestyle choices, including: Daily care of your teeth and gums. Regular physical activity. Eating a healthy diet. Avoiding tobacco and drug use. Limiting alcohol use. Practicing safe sex. Taking low-dose aspirin daily starting at age 39. Taking vitamin and mineral supplements as recommended by your health care provider. What happens during an annual well check? The services and screenings done by your health care provider during your annual well check will depend on your age, overall health, lifestyle risk factors, and family history of disease. Counseling  Your health care provider may ask you questions about your: Alcohol use. Tobacco use. Drug use. Emotional well-being. Home and relationship well-being. Sexual activity. Eating habits. Work and work Astronomer. Method of birth control. Menstrual cycle. Pregnancy history. Screening  You may have the following tests or measurements: Height, weight, and BMI. Blood pressure. Lipid and cholesterol levels. These may be checked every 5 years, or more frequently if you are over 78 years old. Skin check. Lung cancer screening. You may have this screening every year starting at age 52 if you have a 30-pack-year history of smoking and currently smoke or have quit within the past 15 years.  Fecal occult blood test (FOBT) of the stool. You may have this test every year starting at age 55. Flexible sigmoidoscopy or colonoscopy. You may have a sigmoidoscopy every 5 years or a colonoscopy every 10 years starting at age 63. Hepatitis C blood test. Hepatitis B blood test. Sexually transmitted disease (STD) testing. Diabetes screening. This is done by checking your blood sugar (glucose) after you have not eaten for a  while (fasting). You may have this done every 1-3 years. Mammogram. This may be done every 1-2 years. Talk to your health care provider about when you should start having regular mammograms. This may depend on whether you have a family history of breast cancer. BRCA-related cancer screening. This may be done if you have a family history of breast, ovarian, tubal, or peritoneal cancers. Pelvic exam and Pap test. This may be done every 3 years starting at age 68. Starting at age 14, this may be done every 5 years if you have a Pap test in combination with an HPV test. Bone density scan. This is done to screen for osteoporosis. You may have this scan if you are at high risk for osteoporosis. Discuss your test results, treatment options, and if necessary, the need for more tests with your health care provider. Vaccines  Your health care provider may recommend certain vaccines, such as: Influenza vaccine. This is recommended every year. Tetanus, diphtheria, and acellular pertussis (Tdap, Td) vaccine. You may need a Td booster every 10 years. Zoster vaccine. You may need this after age 13. Pneumococcal 13-valent conjugate (PCV13) vaccine. You may need this if you have certain conditions and were not previously vaccinated. Pneumococcal polysaccharide (PPSV23) vaccine. You may need one or two doses if you smoke cigarettes or if you have certain conditions. Talk to your health care provider about which screenings and vaccines you need and how often you need them. This information is not intended to replace advice given to you by your health care provider. Make sure you discuss any questions you have with your health care provider. Document Released: 10/19/2015 Document Revised: 06/11/2016 Document Reviewed: 07/24/2015 Elsevier Interactive Patient Education  2017 [employer].    Fall Prevention in the Home Falls can cause injuries. They can happen to people of all ages. There are many things you can do  to make your home safe and to help prevent falls. What can I do on the outside of my home? Regularly fix the edges of walkways and driveways and fix any cracks. Remove anything that might make you trip as you walk through a door, such as a raised step or threshold. Trim any bushes or trees on the path to your home. Use bright outdoor lighting. Clear any walking paths of anything that might make someone trip, such as rocks or tools. Regularly check to see if handrails are loose or broken. Make sure that both sides of any steps have handrails. Any raised decks and porches should have guardrails on the edges. Have any leaves, snow, or ice cleared regularly. Use sand or salt on walking paths during winter. Clean up any spills in your garage right away. This includes oil or grease spills. What can I do in the bathroom? Use night lights. Install grab bars by the toilet and in the tub and shower. Do not use towel bars as grab bars. Use non-skid mats or decals in the tub or shower. If you need to sit down in the shower, use a plastic, non-slip stool. Keep the floor  dry. Clean up any water that spills on the floor as soon as it happens. Remove soap buildup in the tub or shower regularly. Attach bath mats securely with double-sided non-slip rug tape. Do not have throw rugs and other things on the floor that can make you trip. What can I do in the bedroom? Use night lights. Make sure that you have a light by your bed that is easy to reach. Do not use any sheets or blankets that are too big for your bed. They should not hang down onto the floor. Have a firm chair that has side arms. You can use this for support while you get dressed. Do not have throw rugs and other things on the floor that can make you trip. What can I do in the kitchen? Clean up any spills right away. Avoid walking on wet floors. Keep items that you use a lot in easy-to-reach places. If you need to reach something above you, use  a strong step stool that has a grab bar. Keep electrical cords out of the way. Do not use floor polish or wax that makes floors slippery. If you must use wax, use non-skid floor wax. Do not have throw rugs and other things on the floor that can make you trip. What can I do with my stairs? Do not leave any items on the stairs. Make sure that there are handrails on both sides of the stairs and use them. Fix handrails that are broken or loose. Make sure that handrails are as long as the stairways. Check any carpeting to make sure that it is firmly attached to the stairs. Fix any carpet that is loose or worn. Avoid having throw rugs at the top or bottom of the stairs. If you do have throw rugs, attach them to the floor with carpet tape. Make sure that you have a light switch at the top of the stairs and the bottom of the stairs. If you do not have them, ask someone to add them for you. What else can I do to help prevent falls? Wear shoes that: Do not have high heels. Have rubber bottoms. Are comfortable and fit you well. Are closed at the toe. Do not wear sandals. If you use a stepladder: Make sure that it is fully opened. Do not climb a closed stepladder. Make sure that both sides of the stepladder are locked into place. Ask someone to hold it for you, if possible. Clearly mark and make sure that you can see: Any grab bars or handrails. First and last steps. Where the edge of each step is. Use tools that help you move around (mobility aids) if they are needed. These include: Canes. Walkers. Scooters. Crutches. Turn on the lights when you go into a dark area. Replace any light bulbs as soon as they burn out. Set up your furniture so you have a clear path. Avoid moving your furniture around. If any of your floors are uneven, fix them. If there are any pets around you, be aware of where they are. Review your medicines with your doctor. Some medicines can make you feel dizzy. This can  increase your chance of falling. Ask your doctor what other things that you can do to help prevent falls. This information is not intended to replace advice given to you by your health care provider. Make sure you discuss any questions you have with your health care provider. Document Released: 07/19/2009 Document Revised: 02/28/2016 Document Reviewed: 10/27/2014 Elsevier Interactive Patient  Education  2017 [employer].

## 2023-04-08 NOTE — Progress Notes (Signed)
Subjective:   Ashley [name] is a 62 y.o. female who presents for an Initial Medicare Annual Wellness Visit.  Visit Complete: Virtual  I connected with  Ashley [name] on 04/08/23 by a audio enabled telemedicine application and verified that I am speaking with the correct person using two identifiers.  Patient Location: Home  Provider Location: Home Office  I discussed the limitations of evaluation and management by telemedicine. The patient expressed understanding and agreed to proceed.  Review of Systems     Cardiac Risk Factors include: advanced age (>17men, >51 women);diabetes mellitus;dyslipidemia;hypertension;sedentary lifestyle;smoking/ tobacco exposure     Objective:    Today's Vitals   04/08/23 1123  Weight: 145 lb (65.8 kg)  Height: 5\' 7"  (1.702 m)   Body mass index is 22.71 kg/m.     04/08/2023   11:44 AM 03/31/2023   10:14 AM 12/31/2020   12:00 PM 12/20/2020    1:47 AM 05/21/2017    2:51 PM 05/17/2017    4:40 PM 10/13/2016   10:06 AM  Advanced Directives  Does Patient Have a Medical Advance Directive? No No No No No No No  Would patient like information on creating a medical advance directive? Yes (MAU/Ambulatory/Procedural Areas - Information given)  No - Patient declined No - Patient declined  No - Patient declined     Current Medications (verified) Outpatient Encounter Medications as of 04/08/2023  Medication Sig   ascorbic acid (VITAMIN C) 500 MG tablet Take 1 tablet (500 mg total) by mouth daily.   Blood Glucose Monitoring Suppl (TRUE METRIX METER) w/Device KIT Use to check blood sugar up to 3 times daily.   glipiZIDE (GLUCOTROL XL) 10 MG 24 hr tablet Take 2 tablets (20 mg total) by mouth once daily.   glucose blood (TRUE METRIX BLOOD GLUCOSE TEST) test strip Use to check blood sugar up to 3 times daily.   Insulin Glargine (BASAGLAR KWIKPEN) 100 UNIT/ML Inject 70 Units into the skin daily. (Patient taking differently: Inject 50 Units into the skin daily.  bedtime)   Insulin Pen Needle (TECHLITE PEN NEEDLES) 32G X 4 MM MISC Use as directed.   Multiple Vitamin (MULTIVITAMIN WITH MINERALS) TABS tablet Take 1 tablet by mouth daily.   QUEtiapine (SEROQUEL) 400 MG tablet Take 1 tablet (400 mg total) by mouth 2 (two) times daily.   rosuvastatin (CRESTOR) 40 MG tablet Take 1 tablet (40 mg total) by mouth once daily.   tiZANidine (ZANAFLEX) 4 MG tablet Take 2 tablet(s) by mouth 1 to 3  times daily as needed.   TRUEplus Lancets 28G MISC Use to check blood sugar up to 3 times daily.   Dulaglutide (TRULICITY) 0.75 MG/0.5ML SOPN Inject 0.75 mg into the skin once a week. (Patient not taking: Reported on 03/23/2023)   No facility-administered encounter medications on file as of 04/08/2023.    Allergies (verified) Patient has no known allergies.   History: Past Medical History:  Diagnosis Date   Anxiety    Arthritis    Bipolar 1 disorder (HCC)    Cervical myelopathy (HCC) 12/28/2020   Cervical spinal cord compression (HCC) 12/19/2020   Depression    Diabetes mellitus without complication (HCC)    Type II   GERD (gastroesophageal reflux disease)    Heart murmur    "nothing to worry about"   HNP (herniated nucleus pulposus) with myelopathy, cervical    HTN (hypertension)    not on medication   Pneumonia    Quadriplegia and quadriparesis (HCC)  Stroke [facility]) 11/2020   per patient did not have a stroke   Past Surgical History:  Procedure Laterality Date   ABDOMINAL HYSTERECTOMY     ANTERIOR CERVICAL DECOMP/DISCECTOMY FUSION N/A 12/25/2020   Procedure: CERVICAL THREE-FOUR ANTERIOR CERVICAL DECOMPRESSION/DISCECTOMY FUSION;  Surgeon: Jadene Pierini, MD;  Location: [facility] OR;  Service: Neurosurgery;  Laterality: N/A;  CERVICAL THREE-FOUR ANTERIOR CERVICAL DECOMPRESSION/DISCECTOMY FUSION    LUMBAR LAMINECTOMY/DECOMPRESSION MICRODISCECTOMY Left 04/07/2016   Procedure: Left Lumbar four-five Microdiskectomy;  Surgeon: Maeola Harman, MD;  Location: [facility] NEUR[facility]  ORS;  Service: Neurosurgery;  Laterality: Left;   Family History  Problem Relation Age of Onset   Healthy Mother    Diabetes Mellitus II Father    Breast cancer Sister    Diabetes Brother    Heart disease Neg Hx    Stroke Neg Hx    Kidney disease Neg Hx    Social History   Socioeconomic History   Marital status: Divorced    Spouse name: Not on file   Number of children: 3   Years of education: Not on file   Highest education level: High school graduate  Occupational History   Not on file  Tobacco Use   Smoking status: Every Day    Packs/day: 1.00    Years: 39.00    Additional pack years: 0.00    Total pack years: 39.00    Types: Cigarettes   Smokeless tobacco: Never  Vaping Use   Vaping Use: Never used  Substance and Sexual Activity   Alcohol use: No   Drug use: Yes    Types: Marijuana    Comment: Cocaine -- 04/06/16- last time, smokes marijuana   Sexual activity: Yes    Birth control/protection: Surgical  Other Topics Concern   Not on file  Social History Narrative   Lives with daughter   Right Handed   Drinks 1 cup of coffee per day      Social Determinants of Health   Financial Resource Strain: Low Risk  (04/08/2023)   Overall Financial Resource Strain (CARDIA)    Difficulty of Paying Living Expenses: Not hard at all  Food Insecurity: No Food Insecurity (04/08/2023)   Hunger Vital Sign    Worried About Running Out of Food in the Last Year: Never true    Ran Out of Food in the Last Year: Never true  Transportation Needs: No Transportation Needs (04/08/2023)   PRAPARE - Administrator, Civil Service (Medical): No    Lack of Transportation (Non-Medical): No  Physical Activity: Inactive (04/08/2023)   Exercise Vital Sign    Days of Exercise per Week: 0 days    Minutes of Exercise per Session: 0 min  Stress: No Stress Concern Present (04/08/2023)   [employer] of Occupational Health - Occupational Stress Questionnaire    Feeling of Stress : Only a  little  Social Connections: Moderately Isolated (04/08/2023)   Social Connection and Isolation Panel [NHANES]    Frequency of Communication with Friends and Family: More than three times a week    Frequency of Social Gatherings with Friends and Family: Three times a week    Attends Religious Services: More than 4 times per year    Active Member of Clubs or Organizations: No    Attends Banker Meetings: Never    Marital Status: Divorced    Tobacco Counseling Ready to quit: Not Answered Counseling given: Not Answered   Clinical Intake:  Pre-visit preparation completed: Yes  Pain :  No/denies pain     Diabetes: Yes CBG done?: No Did pt. bring in CBG monitor from home?: No  How often do you need to have someone help you when you read instructions, pamphlets, or other written materials from your doctor or pharmacy?: 1 - Never  Interpreter Needed?: No  Information entered by :: Kandis Fantasia LPN   Activities of Daily Living    04/08/2023   11:44 AM 03/31/2023   10:17 AM  In your present state of health, do you have any difficulty performing the following activities:  Hearing? 0   Vision? 1   Difficulty concentrating or making decisions? 0   Walking or climbing stairs? 1   Dressing or bathing? 0   Doing errands, shopping? 0 0  Preparing Food and eating ? N   Using the Toilet? N   In the past six months, have you accidently leaked urine? N   Do you have problems with loss of bowel control? N   Managing your Medications? N   Managing your Finances? N   Housekeeping or managing your Housekeeping? N     Patient Care Team: Storm Frisk, MD as PCP - General (Pulmonary Disease) Storm Frisk, MD as Consulting Physician (Pulmonary Disease)  Indicate any recent Medical Services you may have received from other than [facility] providers in the past year (date may be approximate).     Assessment:   This is a routine wellness examination for  Drinda.  Hearing/Vision screen Hearing Screening - Comments:: Denies hearing difficulties   Vision Screening - Comments:: No vision problems; will schedule routine eye exam soon    Dietary issues and exercise activities discussed:     Goals Addressed             This Visit's Progress    Increase physical activity       After recovery from neck surgery        Depression Screen    04/08/2023   11:42 AM 01/13/2023   11:10 AM 11/11/2022   11:12 AM 06/04/2022   11:21 AM 04/03/2022   11:46 AM 01/29/2022   11:39 AM 09/03/2021   10:10 AM  PHQ 2/9 Scores  PHQ - 2 Score 1 1 3 5 1 2 3   PHQ- 9 Score 10 11 7 9 8 13 11     Fall Risk    04/08/2023   11:44 AM 01/13/2023   11:10 AM 11/11/2022   11:12 AM 07/09/2022   10:47 AM 06/04/2022   11:21 AM  Fall Risk   Falls in the past year? 0 0 0 0 0  Number falls in past yr: 0 0 0 0 0  Injury with Fall? 0 0 0 0 0  Risk for fall due to : No Fall Risks No Fall Risks No Fall Risks No Fall Risks No Fall Risks  Follow up Falls prevention discussed;Education provided;Falls evaluation completed   Falls evaluation completed Falls evaluation completed    MEDICARE RISK AT HOME:  Medicare Risk at Home - 04/08/23 1144     Any stairs in or around the home? No    If so, are there any without handrails? No    Home free of loose throw rugs in walkways, pet beds, electrical cords, etc? Yes    Adequate lighting in your home to reduce risk of falls? Yes    Life alert? No    Use of a cane, walker or w/c? No    Grab bars in the bathroom?  Yes    Shower chair or bench in shower? Yes    Elevated toilet seat or a handicapped toilet? No             TIMED UP AND GO:  Was the test performed? No    Cognitive Function:        04/08/2023   11:44 AM  6CIT Screen  What Year? 0 points  What month? 0 points  What time? 0 points  Count back from 20 0 points  Months in reverse 0 points  Repeat phrase 0 points  Total Score 0 points     Immunizations Immunization History  Administered Date(s) Administered   Moderna Sars-Covid-2 Vaccination 09/04/2020, 10/03/2020    TDAP status: Due, Education has been provided regarding the importance of this vaccine. Advised may receive this vaccine at local pharmacy or Health Dept. Aware to provide a copy of the vaccination record if obtained from local pharmacy or Health Dept. Verbalized acceptance and understanding.  Pneumococcal vaccine status: Up to date  Covid-19 vaccine status: Declined, Education has been provided regarding the importance of this vaccine but patient still declined. Advised may receive this vaccine at local pharmacy or Health Dept.or vaccine clinic. Aware to provide a copy of the vaccination record if obtained from local pharmacy or Health Dept. Verbalized acceptance and understanding.  Qualifies for Shingles Vaccine? Yes   Zostavax completed No   Shingrix Completed?: No.    Education has been provided regarding the importance of this vaccine. Patient has been advised to call insurance company to determine out of pocket expense if they have not yet received this vaccine. Advised may also receive vaccine at local pharmacy or Health Dept. Verbalized acceptance and understanding.  Screening Tests Health Maintenance  Topic Date Due   DTaP/Tdap/Td (1 - Tdap) Never done   COLON CANCER SCREENING ANNUAL FOBT  09/06/2022   OPHTHALMOLOGY EXAM  04/08/2023 (Originally 11/30/2020)   INFLUENZA VACCINE  05/07/2023   Diabetic kidney evaluation - Urine ACR  06/05/2023   HEMOGLOBIN A1C  09/22/2023   FOOT EXAM  11/12/2023   Lung Cancer Screening  12/09/2023   Diabetic kidney evaluation - eGFR measurement  03/30/2024   Medicare Annual Wellness (AWV)  04/07/2024   MAMMOGRAM  04/11/2024   Hepatitis C Screening  Completed   HIV Screening  Completed   HPV VACCINES  Aged Out   PAP SMEAR-Modifier  Discontinued   Colonoscopy  Discontinued   COVID-19 Vaccine  Discontinued    Zoster Vaccines- Shingrix  Discontinued    Health Maintenance  Health Maintenance Due  Topic Date Due   DTaP/Tdap/Td (1 - Tdap) Never done   COLON CANCER SCREENING ANNUAL FOBT  09/06/2022    Colorectal cancer screening:  Patient declines at this time  Mammogram status: Completed 04/11/22. Repeat every year  Lung Cancer Screening: (Low Dose CT Chest recommended if Age 13-80 years, 20 pack-year currently smoking OR have quit w/in 15years.) does qualify.   Lung Cancer Screening Referral: last 12/09/22  Additional Screening:  Hepatitis C Screening: does qualify; Completed 04/19/21  Vision Screening: Recommended annual ophthalmology exams for early detection of glaucoma and other disorders of the eye. Is the patient up to date with their annual eye exam?  No  Who is the provider or what is the name of the office in which the patient attends annual eye exams? none If pt is not established with a provider, would they like to be referred to a provider to establish care? No .  Dental Screening: Recommended annual dental exams for proper oral hygiene  Diabetic Foot Exam: Diabetic Foot Exam: Completed 11/11/22  Community Resource Referral / Chronic Care Management: CRR required this visit?  No   CCM required this visit?  No     Plan:     I have personally reviewed and noted the following in the patient's chart:   Medical and social history Use of alcohol, tobacco or illicit drugs  Current medications and supplements including opioid prescriptions. Patient is not currently taking opioid prescriptions. Functional ability and status Nutritional status Physical activity Advanced directives List of other physicians Hospitalizations, surgeries, and ER visits in previous 12 months Vitals Screenings to include cognitive, depression, and falls Referrals and appointments  In addition, I have reviewed and discussed with patient certain preventive protocols, quality metrics, and best  practice recommendations. A written personalized care plan for preventive services as well as general preventive health recommendations were provided to patient.     Kandis Fantasia [place], [place]   10/11/1094   After Visit Summary: (Mail) Due to this being a telephonic visit, the after visit summary with patients personalized plan was offered to patient via mail   Nurse Notes: No concerns

## 2023-04-12 MED ORDER — ORAL CARE MOUTH RINSE: 15.0000 mL | Freq: Once | OROMUCOSAL | Status: AC

## 2023-04-12 MED ORDER — CEFAZOLIN IN SODIUM CHLORIDE 2-0.9 GM/100ML-% IV SOLN
2.0000 g | Freq: Once | INTRAVENOUS | Status: AC
  Administered 2023-04-13: 2 g via INTRAVENOUS
  Filled 2023-04-12: qty 100

## 2023-04-12 MED ORDER — SODIUM CHLORIDE 0.9 % IV SOLN: INTRAVENOUS | Status: DC

## 2023-04-12 MED ORDER — CHLORHEXIDINE GLUCONATE 0.12 % MT SOLN
15.0000 mL | Freq: Once | OROMUCOSAL | Status: AC
  Administered 2023-04-13: 15 mL via OROMUCOSAL

## 2023-04-12 MED ORDER — FAMOTIDINE 20 MG PO TABS
20.0000 mg | ORAL_TABLET | Freq: Once | ORAL | Status: AC
  Administered 2023-04-13: 20 mg via ORAL

## 2023-04-13 ENCOUNTER — Encounter
Admission: RE | Disposition: A | Discharge status: Home / Self Care | Payer: Self-pay | Source: Home / Self Care | Attending: Neurosurgery

## 2023-04-13 ENCOUNTER — Encounter: Payer: Self-pay | Admitting: Neurosurgery

## 2023-04-13 ENCOUNTER — Ambulatory Visit: Payer: Medicare Other

## 2023-04-13 ENCOUNTER — Other Ambulatory Visit: Payer: Self-pay

## 2023-04-13 ENCOUNTER — Other Ambulatory Visit (HOSPITAL_COMMUNITY): Payer: Self-pay

## 2023-04-13 ENCOUNTER — Ambulatory Visit: Payer: Medicare Other | Admitting: Urgent Care

## 2023-04-13 ENCOUNTER — Observation Stay
Admission: RE | Admit: 2023-04-13 | Discharge: 2023-04-14 | Disposition: A | Discharge status: Home / Self Care | Payer: Medicare Other | Attending: Neurosurgery | Admitting: Neurosurgery

## 2023-04-13 DIAGNOSIS — Z8673 Personal history of transient ischemic attack (TIA), and cerebral infarction without residual deficits: Secondary | ICD-10-CM | POA: Diagnosis not present

## 2023-04-13 DIAGNOSIS — G959 Disease of spinal cord, unspecified: Secondary | ICD-10-CM | POA: Diagnosis present

## 2023-04-13 DIAGNOSIS — M4802 Spinal stenosis, cervical region: Secondary | ICD-10-CM

## 2023-04-13 DIAGNOSIS — Z7984 Long term (current) use of oral hypoglycemic drugs: Secondary | ICD-10-CM | POA: Insufficient documentation

## 2023-04-13 DIAGNOSIS — Z01818 Encounter for other preprocedural examination: Secondary | ICD-10-CM

## 2023-04-13 DIAGNOSIS — E119 Type 2 diabetes mellitus without complications: Secondary | ICD-10-CM | POA: Insufficient documentation

## 2023-04-13 DIAGNOSIS — G992 Myelopathy in diseases classified elsewhere: Secondary | ICD-10-CM

## 2023-04-13 DIAGNOSIS — F17210 Nicotine dependence, cigarettes, uncomplicated: Secondary | ICD-10-CM | POA: Diagnosis not present

## 2023-04-13 DIAGNOSIS — M50021 Cervical disc disorder at C4-C5 level with myelopathy: Secondary | ICD-10-CM | POA: Insufficient documentation

## 2023-04-13 DIAGNOSIS — M960 Pseudarthrosis after fusion or arthrodesis: Principal | ICD-10-CM

## 2023-04-13 DIAGNOSIS — Z794 Long term (current) use of insulin: Secondary | ICD-10-CM | POA: Diagnosis not present

## 2023-04-13 DIAGNOSIS — M5001 Cervical disc disorder with myelopathy,  high cervical region: Secondary | ICD-10-CM | POA: Insufficient documentation

## 2023-04-13 DIAGNOSIS — I10 Essential (primary) hypertension: Secondary | ICD-10-CM | POA: Insufficient documentation

## 2023-04-13 DIAGNOSIS — Z7985 Long-term (current) use of injectable non-insulin antidiabetic drugs: Secondary | ICD-10-CM | POA: Insufficient documentation

## 2023-04-13 DIAGNOSIS — Z79899 Other long term (current) drug therapy: Secondary | ICD-10-CM | POA: Diagnosis not present

## 2023-04-13 DIAGNOSIS — Z01812 Encounter for preprocedural laboratory examination: Secondary | ICD-10-CM

## 2023-04-13 HISTORY — PX: ANTERIOR CERVICAL DECOMP/DISCECTOMY FUSION: SHX1161

## 2023-04-13 LAB — GLUCOSE, CAPILLARY
Glucose-Capillary: 82 mg/dL (ref 70–99)
Glucose-Capillary: 101 mg/dL — ABNORMAL HIGH (ref 70–99)
Glucose-Capillary: 401 mg/dL — ABNORMAL HIGH (ref 70–99)
Glucose-Capillary: 389 mg/dL — ABNORMAL HIGH (ref 70–99)
Glucose-Capillary: 323 mg/dL — ABNORMAL HIGH (ref 70–99)

## 2023-04-13 LAB — GLUCOSE, RANDOM: Glucose, Bld: 407 mg/dL — ABNORMAL HIGH (ref 70–99)

## 2023-04-13 LAB — ABO/RH: ABO/RH(D): A POS

## 2023-04-13 SURGERY — ANTERIOR CERVICAL DECOMPRESSION/DISCECTOMY FUSION 2 LEVELS
Anesthesia: General | Site: Spine Cervical

## 2023-04-13 MED ORDER — OXYCODONE HCL 5 MG PO TABS
5.0000 mg | ORAL_TABLET | ORAL | 0 refills | Status: DC | PRN
  Filled 2023-04-13: qty 30, 5d supply, fill #0

## 2023-04-13 MED ORDER — CELECOXIB 200 MG PO CAPS
ORAL_CAPSULE | ORAL | Status: AC
  Filled 2023-04-13: qty 1

## 2023-04-13 MED ORDER — OXYCODONE HCL 5 MG PO TABS
ORAL_TABLET | ORAL | Status: AC
  Filled 2023-04-13: qty 2

## 2023-04-13 MED ORDER — ACETAMINOPHEN 10 MG/ML IV SOLN
INTRAVENOUS | Status: AC
  Filled 2023-04-13: qty 100

## 2023-04-13 MED ORDER — ACETAMINOPHEN 325 MG PO TABS: 650.0000 mg | ORAL_TABLET | ORAL | Status: DC | PRN

## 2023-04-13 MED ORDER — CELECOXIB 200 MG PO CAPS
200.0000 mg | ORAL_CAPSULE | Freq: Two times a day (BID) | ORAL | Status: DC
  Administered 2023-04-13 – 2023-04-14 (×2): 200 mg via ORAL
  Filled 2023-04-13: qty 1

## 2023-04-13 MED ORDER — INSULIN ASPART 100 UNIT/ML IJ SOLN
0.0000 [IU] | Freq: Every day | INTRAMUSCULAR | Status: DC
  Administered 2023-04-13: 4 [IU] via SUBCUTANEOUS

## 2023-04-13 MED ORDER — SUCCINYLCHOLINE CHLORIDE 200 MG/10ML IV SOSY
PREFILLED_SYRINGE | INTRAVENOUS | Status: AC
  Filled 2023-04-13: qty 10

## 2023-04-13 MED ORDER — OXYCODONE HCL 5 MG PO TABS
5.0000 mg | ORAL_TABLET | ORAL | Status: DC | PRN
  Filled 2023-04-13 (×2): qty 1

## 2023-04-13 MED ORDER — METHOCARBAMOL 1000 MG/10ML IJ SOLN
500.0000 mg | Freq: Four times a day (QID) | INTRAVENOUS | Status: DC | PRN
  Filled 2023-04-13: qty 5

## 2023-04-13 MED ORDER — SODIUM CHLORIDE 0.9 % IV SOLN: 250.0000 mL | INTRAVENOUS | Status: DC

## 2023-04-13 MED ORDER — METHOCARBAMOL 500 MG PO TABS
ORAL_TABLET | ORAL | Status: AC
  Filled 2023-04-13: qty 1

## 2023-04-13 MED ORDER — PROPOFOL 10 MG/ML IV BOLUS
INTRAVENOUS | Status: DC | PRN
  Administered 2023-04-13: 100 ug/kg/min via INTRAVENOUS
  Administered 2023-04-13: 150 mg via INTRAVENOUS

## 2023-04-13 MED ORDER — FENTANYL CITRATE (PF) 100 MCG/2ML IJ SOLN
INTRAMUSCULAR | Status: DC | PRN
  Administered 2023-04-13 (×2): 50 ug via INTRAVENOUS

## 2023-04-13 MED ORDER — DEXAMETHASONE SODIUM PHOSPHATE 10 MG/ML IJ SOLN
INTRAMUSCULAR | Status: AC
  Filled 2023-04-13: qty 1

## 2023-04-13 MED ORDER — OXYCODONE HCL 5 MG/5ML PO SOLN: 5.0000 mg | Freq: Once | ORAL | Status: AC | PRN

## 2023-04-13 MED ORDER — LIDOCAINE HCL (CARDIAC) PF 100 MG/5ML IV SOSY
PREFILLED_SYRINGE | INTRAVENOUS | Status: DC | PRN
  Administered 2023-04-13: 100 mg via INTRAVENOUS

## 2023-04-13 MED ORDER — METHOCARBAMOL 500 MG PO TABS
500.0000 mg | ORAL_TABLET | Freq: Four times a day (QID) | ORAL | Status: DC | PRN
  Administered 2023-04-13: 500 mg via ORAL

## 2023-04-13 MED ORDER — FENTANYL CITRATE (PF) 100 MCG/2ML IJ SOLN
INTRAMUSCULAR | Status: AC
  Filled 2023-04-13: qty 2

## 2023-04-13 MED ORDER — PHENYLEPHRINE HCL (PRESSORS) 10 MG/ML IV SOLN
INTRAVENOUS | Status: DC | PRN
  Administered 2023-04-13 (×3): 80 ug via INTRAVENOUS

## 2023-04-13 MED ORDER — OXYCODONE HCL 5 MG PO TABS
ORAL_TABLET | ORAL | Status: AC
  Filled 2023-04-13: qty 1

## 2023-04-13 MED ORDER — PHENOL 1.4 % MT LIQD: 1.0000 | OROMUCOSAL | Status: DC | PRN

## 2023-04-13 MED ORDER — BUPIVACAINE-EPINEPHRINE (PF) 0.5% -1:200000 IJ SOLN
INTRAMUSCULAR | Status: DC | PRN
  Administered 2023-04-13: 4.4 mL via PERINEURAL

## 2023-04-13 MED ORDER — ONDANSETRON HCL 4 MG/2ML IJ SOLN
INTRAMUSCULAR | Status: DC | PRN
  Administered 2023-04-13: 4 mg via INTRAVENOUS

## 2023-04-13 MED ORDER — VASOPRESSIN 20 UNIT/ML IV SOLN
INTRAVENOUS | Status: AC
  Filled 2023-04-13: qty 1

## 2023-04-13 MED ORDER — ACETAMINOPHEN 10 MG/ML IV SOLN
INTRAVENOUS | Status: DC | PRN
  Administered 2023-04-13: 1000 mg via INTRAVENOUS

## 2023-04-13 MED ORDER — FENTANYL CITRATE (PF) 100 MCG/2ML IJ SOLN
25.0000 ug | INTRAMUSCULAR | Status: DC | PRN
  Administered 2023-04-13 (×2): 25 ug via INTRAVENOUS
  Administered 2023-04-13 (×3): 50 ug via INTRAVENOUS

## 2023-04-13 MED ORDER — EPHEDRINE SULFATE (PRESSORS) 50 MG/ML IJ SOLN
INTRAMUSCULAR | Status: DC | PRN
  Administered 2023-04-13: 5 mg via INTRAVENOUS

## 2023-04-13 MED ORDER — MIDAZOLAM HCL 2 MG/2ML IJ SOLN
INTRAMUSCULAR | Status: DC | PRN
  Administered 2023-04-13: 2 mg via INTRAVENOUS

## 2023-04-13 MED ORDER — METHOCARBAMOL 500 MG PO TABS
500.0000 mg | ORAL_TABLET | Freq: Three times a day (TID) | ORAL | 0 refills | Status: DC | PRN
  Filled 2023-04-13: qty 90, 30d supply, fill #0

## 2023-04-13 MED ORDER — INSULIN GLARGINE-YFGN 100 UNIT/ML ~~LOC~~ SOLN
50.0000 [IU] | Freq: Every day | SUBCUTANEOUS | Status: DC
  Administered 2023-04-13: 50 [IU] via SUBCUTANEOUS
  Filled 2023-04-13 (×2): qty 0.5

## 2023-04-13 MED ORDER — ACETAMINOPHEN 650 MG RE SUPP: 650.0000 mg | RECTAL | Status: DC | PRN

## 2023-04-13 MED ORDER — POTASSIUM CHLORIDE IN NACL 20-0.9 MEQ/L-% IV SOLN
INTRAVENOUS | Status: DC
  Filled 2023-04-13 (×2): qty 1000

## 2023-04-13 MED ORDER — ONDANSETRON HCL 4 MG/2ML IJ SOLN: 4.0000 mg | Freq: Four times a day (QID) | INTRAMUSCULAR | Status: DC | PRN

## 2023-04-13 MED ORDER — ONDANSETRON HCL 4 MG PO TABS: 4.0000 mg | ORAL_TABLET | Freq: Four times a day (QID) | ORAL | Status: DC | PRN

## 2023-04-13 MED ORDER — LABETALOL HCL 5 MG/ML IV SOLN
INTRAVENOUS | Status: AC
  Filled 2023-04-13: qty 4

## 2023-04-13 MED ORDER — HYDRALAZINE HCL 20 MG/ML IJ SOLN
INTRAMUSCULAR | Status: AC
  Filled 2023-04-13: qty 1

## 2023-04-13 MED ORDER — QUETIAPINE FUMARATE 300 MG PO TABS
800.0000 mg | ORAL_TABLET | Freq: Every day | ORAL | Status: DC
  Administered 2023-04-13: 800 mg via ORAL
  Filled 2023-04-13: qty 1

## 2023-04-13 MED ORDER — LIDOCAINE HCL (PF) 2 % IJ SOLN
INTRAMUSCULAR | Status: AC
  Filled 2023-04-13: qty 5

## 2023-04-13 MED ORDER — SURGIFLO WITH THROMBIN (HEMOSTATIC MATRIX KIT) OPTIME
TOPICAL | Status: DC | PRN
  Administered 2023-04-13: 1 via TOPICAL

## 2023-04-13 MED ORDER — PHENYLEPHRINE 80 MCG/ML (10ML) SYRINGE FOR IV PUSH (FOR BLOOD PRESSURE SUPPORT): PREFILLED_SYRINGE | INTRAVENOUS | Status: DC | PRN

## 2023-04-13 MED ORDER — MIDAZOLAM HCL 2 MG/2ML IJ SOLN
INTRAMUSCULAR | Status: AC
  Filled 2023-04-13: qty 2

## 2023-04-13 MED ORDER — DEXMEDETOMIDINE HCL IN NACL 200 MCG/50ML IV SOLN
INTRAVENOUS | Status: DC | PRN
  Administered 2023-04-13 (×2): 12 ug via INTRAVENOUS

## 2023-04-13 MED ORDER — MENTHOL 3 MG MT LOZG: 1.0000 | LOZENGE | OROMUCOSAL | Status: DC | PRN

## 2023-04-13 MED ORDER — SODIUM CHLORIDE 0.9% FLUSH: 3.0000 mL | INTRAVENOUS | Status: DC | PRN

## 2023-04-13 MED ORDER — HYDRALAZINE HCL 20 MG/ML IJ SOLN
10.0000 mg | Freq: Once | INTRAMUSCULAR | Status: AC
  Administered 2023-04-13: 10 mg via INTRAVENOUS

## 2023-04-13 MED ORDER — PROPOFOL 1000 MG/100ML IV EMUL
INTRAVENOUS | Status: AC
  Filled 2023-04-13: qty 100

## 2023-04-13 MED ORDER — INSULIN ASPART 100 UNIT/ML IJ SOLN
INTRAMUSCULAR | Status: AC
  Filled 2023-04-13: qty 1

## 2023-04-13 MED ORDER — SUCCINYLCHOLINE CHLORIDE 200 MG/10ML IV SOSY
PREFILLED_SYRINGE | INTRAVENOUS | Status: DC | PRN
  Administered 2023-04-13: 100 mg via INTRAVENOUS

## 2023-04-13 MED ORDER — CHLORHEXIDINE GLUCONATE 0.12 % MT SOLN
OROMUCOSAL | Status: AC
  Filled 2023-04-13: qty 15

## 2023-04-13 MED ORDER — SENNA 8.6 MG PO TABS
ORAL_TABLET | ORAL | Status: AC
  Filled 2023-04-13: qty 1

## 2023-04-13 MED ORDER — INSULIN ASPART 100 UNIT/ML IJ SOLN
0.0000 [IU] | Freq: Three times a day (TID) | INTRAMUSCULAR | Status: DC
  Administered 2023-04-13: 20 [IU] via SUBCUTANEOUS
  Administered 2023-04-14: 3 [IU] via SUBCUTANEOUS
  Filled 2023-04-13: qty 1

## 2023-04-13 MED ORDER — KETOROLAC TROMETHAMINE 30 MG/ML IJ SOLN
INTRAMUSCULAR | Status: AC
  Filled 2023-04-13: qty 1

## 2023-04-13 MED ORDER — CEFAZOLIN SODIUM-DEXTROSE 2-4 GM/100ML-% IV SOLN
INTRAVENOUS | Status: AC
  Filled 2023-04-13: qty 100

## 2023-04-13 MED ORDER — OXYCODONE HCL 5 MG PO TABS
10.0000 mg | ORAL_TABLET | ORAL | Status: DC | PRN
  Administered 2023-04-13 – 2023-04-14 (×3): 10 mg via ORAL
  Filled 2023-04-13: qty 2

## 2023-04-13 MED ORDER — SODIUM CHLORIDE 0.9% FLUSH
3.0000 mL | Freq: Two times a day (BID) | INTRAVENOUS | Status: DC
  Administered 2023-04-13 – 2023-04-14 (×2): 3 mL via INTRAVENOUS

## 2023-04-13 MED ORDER — 0.9 % SODIUM CHLORIDE (POUR BTL) OPTIME
TOPICAL | Status: DC | PRN
  Administered 2023-04-13: 500 mL

## 2023-04-13 MED ORDER — SENNA 8.6 MG PO TABS
1.0000 | ORAL_TABLET | Freq: Two times a day (BID) | ORAL | Status: DC
  Administered 2023-04-13 – 2023-04-14 (×2): 8.6 mg via ORAL
  Filled 2023-04-13: qty 1

## 2023-04-13 MED ORDER — LABETALOL HCL 5 MG/ML IV SOLN
10.0000 mg | INTRAVENOUS | Status: AC | PRN
  Administered 2023-04-13 (×2): 10 mg via INTRAVENOUS

## 2023-04-13 MED ORDER — VASOPRESSIN 20 UNIT/ML IV SOLN
INTRAVENOUS | Status: DC | PRN
  Administered 2023-04-13 (×2): 1 [IU] via INTRAVENOUS

## 2023-04-13 MED ORDER — ONDANSETRON HCL 4 MG/2ML IJ SOLN
INTRAMUSCULAR | Status: AC
  Filled 2023-04-13: qty 2

## 2023-04-13 MED ORDER — QUETIAPINE FUMARATE 200 MG PO TABS: 400.0000 mg | ORAL_TABLET | Freq: Two times a day (BID) | ORAL | Status: DC

## 2023-04-13 MED ORDER — DEXAMETHASONE SODIUM PHOSPHATE 10 MG/ML IJ SOLN
INTRAMUSCULAR | Status: DC | PRN
  Administered 2023-04-13: 10 mg via INTRAVENOUS

## 2023-04-13 MED ORDER — FAMOTIDINE 20 MG PO TABS
ORAL_TABLET | ORAL | Status: AC
  Filled 2023-04-13: qty 1

## 2023-04-13 MED ORDER — DEXMEDETOMIDINE HCL IN NACL 80 MCG/20ML IV SOLN
INTRAVENOUS | Status: AC
  Filled 2023-04-13: qty 20

## 2023-04-13 MED ORDER — OXYCODONE HCL 5 MG PO TABS
5.0000 mg | ORAL_TABLET | Freq: Once | ORAL | Status: AC | PRN
  Administered 2023-04-13: 5 mg via ORAL

## 2023-04-13 SURGICAL SUPPLY — 51 items
ADH SKN CLS APL DERMABOND .7 (GAUZE/BANDAGES/DRESSINGS) ×1
AGENT HMST KT MTR STRL THRMB (HEMOSTASIS) ×1
ALLOGRAFT BONE FIBER KORE 1CC (Bone Implant) IMPLANT
BASIN KIT SINGLE STR (MISCELLANEOUS) ×1 IMPLANT
BULB RESERV EVAC DRAIN JP 100C (MISCELLANEOUS) IMPLANT
BUR NEURO DRILL SOFT 3.0X3.8M (BURR) ×1 IMPLANT
DERMABOND ADVANCED .7 DNX12 (GAUZE/BANDAGES/DRESSINGS) ×1 IMPLANT
DRAIN CHANNEL JP 10F RND 20C F (MISCELLANEOUS) IMPLANT
DRAPE C ARM PK CFD 31 SPINE (DRAPES) ×1 IMPLANT
DRAPE LAPAROTOMY 77X122 PED (DRAPES) ×1 IMPLANT
DRAPE MICROSCOPE SPINE 48X150 (DRAPES) ×1 IMPLANT
ELECT REM PT RETURN 9FT ADLT (ELECTROSURGICAL) ×1
ELECTRODE REM PT RTRN 9FT ADLT (ELECTROSURGICAL) ×1 IMPLANT
FEE INTRAOP CADWELL SUPPLY NCS (MISCELLANEOUS) IMPLANT
FEE INTRAOP MONITOR IMPULS NCS (MISCELLANEOUS) IMPLANT
GLOVE BIOGEL PI IND STRL 6.5 (GLOVE) ×1 IMPLANT
GLOVE SURG SYN 6.5 ES PF (GLOVE) ×1 IMPLANT
GLOVE SURG SYN 6.5 PF PI (GLOVE) ×1 IMPLANT
GLOVE SURG SYN 8.5 E (GLOVE) ×3 IMPLANT
GLOVE SURG SYN 8.5 PF PI (GLOVE) ×3 IMPLANT
GOWN SRG LRG LVL 4 IMPRV REINF (GOWNS) ×1 IMPLANT
GOWN SRG XL LVL 3 NONREINFORCE (GOWNS) ×1 IMPLANT
GOWN STRL NON-REIN TWL XL LVL3 (GOWNS) ×1
GOWN STRL REIN LRG LVL4 (GOWNS) ×1
INTRAOP CADWELL SUPPLY FEE NCS (MISCELLANEOUS) ×1
INTRAOP DISP SUPPLY FEE NCS (MISCELLANEOUS) ×1
INTRAOP MONITOR FEE IMPULS NCS (MISCELLANEOUS) ×1
KIT TURNOVER KIT A (KITS) ×1 IMPLANT
MANIFOLD NEPTUNE II (INSTRUMENTS) ×1 IMPLANT
NDL SAFETY ECLIP 18X1.5 (MISCELLANEOUS) ×1 IMPLANT
NS IRRIG 1000ML POUR BTL (IV SOLUTION) ×1 IMPLANT
NS IRRIG 500ML POUR BTL (IV SOLUTION) IMPLANT
PACK LAMINECTOMY ARMC (PACKS) ×1 IMPLANT
PAD ARMBOARD 7.5X6 YLW CONV (MISCELLANEOUS) ×2 IMPLANT
PIN CASPAR 14 (PIN) ×1 IMPLANT
PIN CASPAR 14MM (PIN) ×2 IMPLANT
PLATE ACP 1.9X34 2L NS (Plate) IMPLANT
SCREW ACP 3.5X17 S/D VARIA (Screw) IMPLANT
SCREW ACP VSD 3.5X19 (Screw) IMPLANT
SPACER C HEDRON 12X14 6 7D (Spacer) IMPLANT
SPACER C HEDRON 12X14 7M 7D (Spacer) IMPLANT
SPONGE KITTNER 5P (MISCELLANEOUS) ×1 IMPLANT
STAPLER SKIN PROX 35W (STAPLE) IMPLANT
SURGIFLO W/THROMBIN 8M KIT (HEMOSTASIS) ×1 IMPLANT
SUT DVC VLOC 3-0 CL 6 P-12 (SUTURE) IMPLANT
SUT VIC AB 3-0 SH 8-18 (SUTURE) ×1 IMPLANT
SYR 20ML LL LF (SYRINGE) ×1 IMPLANT
TAPE CLOTH 3X10 WHT NS LF (GAUZE/BANDAGES/DRESSINGS) ×3 IMPLANT
TRAP FLUID SMOKE EVACUATOR (MISCELLANEOUS) ×1 IMPLANT
TRAY FOLEY SLVR 16FR LF STAT (SET/KITS/TRAYS/PACK) IMPLANT
WATER STERILE IRR 1000ML POUR (IV SOLUTION) ×2 IMPLANT

## 2023-04-13 NOTE — Anesthesia Preprocedure Evaluation (Signed)
Anesthesia Evaluation  Patient identified by MRN, date of birth, ID band Patient awake    Reviewed: Allergy & Precautions, NPO status , Patient's Chart, lab work & pertinent test results  History of Anesthesia Complications Negative for: history of anesthetic complications  Airway Mallampati: III  TM Distance: >3 FB Neck ROM: full    Dental  (+) Missing   Pulmonary shortness of breath and with exertion, COPD, Current Smoker and Patient abstained from smoking.   Pulmonary exam normal        Cardiovascular Exercise Tolerance: Good hypertension, + Peripheral Vascular Disease  Normal cardiovascular exam+ dysrhythmias (RBBB)      Neuro/Psych  PSYCHIATRIC DISORDERS       Neuromuscular disease CVA, Residual Symptoms    GI/Hepatic Neg liver ROS,GERD  Controlled,,  Endo/Other  negative endocrine ROSdiabetes, Type 2, Insulin Dependent    Renal/GU Renal disease     Musculoskeletal   Abdominal   Peds  Hematology negative hematology ROS (+)   Anesthesia Other Findings Past Medical History: No date: Anxiety No date: Arthritis No date: Bipolar 1 disorder (HCC) 12/28/2020: Cervical myelopathy (HCC) 12/19/2020: Cervical spinal cord compression (HCC) No date: Depression No date: Diabetes mellitus without complication (HCC)     Comment:  Type II No date: GERD (gastroesophageal reflux disease) No date: Heart murmur     Comment:  "nothing to worry about" No date: HNP (herniated nucleus pulposus) with myelopathy, cervical No date: HTN (hypertension)     Comment:  not on medication No date: Pneumonia No date: Quadriplegia and quadriparesis (HCC) 11/2020: Stroke (HCC)     Comment:  per patient did not have a stroke  Past Surgical History: No date: ABDOMINAL HYSTERECTOMY 12/25/2020: ANTERIOR CERVICAL DECOMP/DISCECTOMY FUSION; N/A     Comment:  Procedure: CERVICAL THREE-FOUR ANTERIOR CERVICAL                DECOMPRESSION/DISCECTOMY FUSION;  Surgeon: Jadene Pierini, MD;  Location: [facility] OR;  Service: Neurosurgery;                Laterality: N/A;  CERVICAL THREE-FOUR ANTERIOR CERVICAL               DECOMPRESSION/DISCECTOMY FUSION  04/07/2016: LUMBAR LAMINECTOMY/DECOMPRESSION MICRODISCECTOMY; Left     Comment:  Procedure: Left Lumbar four-five Microdiskectomy;                Surgeon: Maeola Harman, MD;  Location: [facility] NEUR[facility] ORS;                Service: Neurosurgery;  Laterality: Left;  BMI    Body Mass Index: 22.86 kg/m      Reproductive/Obstetrics negative OB ROS                             Anesthesia Physical Anesthesia Plan  ASA: 3  Anesthesia Plan: General ETT   Post-op Pain Management:    Induction: Intravenous  PONV Risk Score and Plan: Ondansetron, Dexamethasone, Midazolam and Treatment may vary due to age or medical condition  Airway Management Planned: Oral ETT  Additional Equipment:   Intra-op Plan:   Post-operative Plan: Extubation in OR  Informed Consent: I have reviewed the patients History and Physical, chart, labs and discussed the procedure including the risks, benefits and alternatives for the proposed anesthesia with the patient or authorized representative who has indicated his/her understanding and acceptance.  Dental Advisory Given  Plan Discussed with: Anesthesiologist, CRNA and Surgeon  Anesthesia Plan Comments: (Patient consented for risks of anesthesia including but not limited to:  - adverse reactions to medications - damage to eyes, teeth, lips or other oral mucosa - nerve damage due to positioning  - sore throat or hoarseness - Damage to heart, brain, nerves, lungs, other parts of body or loss of life  Patient voiced understanding.)       Anesthesia Quick Evaluation

## 2023-04-13 NOTE — Op Note (Signed)
Indications: [name] is a 62 y.o. 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 Due to ongoing symptoms and lack of reasonable conservative management options, the patient opted for surgical intervention.  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   Preoperative Note:     Preoperative Note:   Risks of surgery discussed and documented in clinic note.  Operative Note:   Procedure:  1) Anterior cervical diskectomy and fusion at C3/4 and C4/5 2) Anterior cervical instrumentation at C3 - 5 3) Placement of biomechanical devices at C3/4 and C4/5  4) Use of operative microscope 5) Use of flouroscopy   Procedure: After obtaining informed consent, the patient taken to the operating room, placed in supine position, general anesthesia induced.  The patient had a small shoulder roll placed behind their shoulders.  The patient received preop antibiotics and IV Decadron.  The patient had a neck incision outlined, was prepped and draped in usual sterile fashion. The incision was injected with local anesthetic.   An incision was opened, dissection taken down medial to the carotid artery and jugular vein, lateral to the trachea and esophagus.  The prevertebral fascia identified and a localizing x-ray demonstrated the correct level.  The prior C3-4 plate was fully exposed and removed.  Substantial anterior osteophytes were noted between the body of C4 and C5.  This was very carefully dissected free and removed to expose the anterior portion of the spine from C3-C5.  The longus colli were dissected laterally, and  self-retaining retractors placed to open the operative field. The microscope was then brought into the field.  With this complete, distractor pins were placed in the vertebral bodies of C3 and C5. The distractor was placed, and the annulus at C4-5 was opened.  Curettes and pituitary rongeurs used to remove the majority of C4/5 disk, then the drill was used to remove the posterior osteophyte and begin the foraminotomies. The nerve hook was used to elevate the posterior longitudinal ligament, which was then removed with Kerrison rongeurs to complete decompression of the spinal cord. The Kerrison rongeurs were then used to complete the foraminotomies bilaterally to decompress the nerve roots. The nerve hook could be passed out each foramen, ensuring decompression of the nerve roots. Meticulous hemostasis was obtained. A biomechanical device (Globus Hedron C 7 mm height x 14 mm width by 12 mm depth) was placed at C4/5. The device had been filled with demineralized bone matrix for aid in arthrodesis.  Please note that the procedure included removal of the disc, removal of the posterior osteophytes, and removal of the posterior longitudinal ligament to ensure decompression of the spinal cord.  Additionally, foraminotomies were performed on both sides of the spinal canal to decompress the nerve roots.  We then moved to the C3/4 level.  A pseudoarthrosis was noted.  Using the drill, the remaining portions of the prior allograft spacer were drilled out.  This was drilled to the posterior portion of the disc base where the dura was inspected.  Using 2 mm punches, the spinal cord and nerve roots were confirmed to be decompressed.    The nerve hook could be passed out each foramen, ensuring decompression of the nerve roots. Meticulous hemostasis  was obtained. A biomechanical device (Globus Hedron C 6 mm height x 14 mm width by 12 mm depth) was placed at C3/4. The device had been filled with demineralized bone matrix for  aid in arthrodesis.  Please note that the procedure included removal of the remaining disc, removal of the posterior osteophytes, and removal of the posterior longitudinal ligament to ensure decompression of the spinal cord.  Additionally, foraminotomies were performed on both sides of the spinal canal to decompress the nerve roots.  The caspar distractor was removed, and bone wax used for hemostasis. A separate, 34 mm Nuvasive ACP plate was chosen.  Two screws placed in each vertebral body, respectively making sure the screws were behind the locking mechanism.  Final AP and lateral radiographs were taken.   Please note that the plate is not inclusive to the biomechanical devices.  The anchoring mechanism of the plate is completely separate from the biomechanical devices.  With everything in good position, the wound was irrigated copiously and meticulous hemostasis obtained.  Wound was closed in 2 layers using interrupted inverted 3-0 Vicryl sutures in the platysma and 3-0 monocryl on the dermis.  The wound was dressed with dermabond, the head of bed at 30 degrees, taken to recovery room in stable condition.  No new postop neurological deficits were identified.  Sponge and pattie counts were correct at the end of the procedure.     I performed the entire procedure with the assistance of Ashley Charity PA as an Designer, television/film set. An assistant was required for this procedure due to the complexity.  The assistant provided assistance in tissue manipulation and suction, and was required for the successful and safe performance of the procedure. I performed the critical portions of the procedure.   Ashley Night MD

## 2023-04-13 NOTE — Anesthesia Postprocedure Evaluation (Signed)
Anesthesia Post Note  Patient: Ashley [name]  Procedure(s) Performed: C3-5 ANTERIOR CERVICAL DISCECTOMY AND FUSION (HEDRON) (Spine Cervical)  Patient location during evaluation: PACU Anesthesia Type: General Level of consciousness: awake and alert Pain management: pain level controlled Vital Signs Assessment: post-procedure vital signs reviewed and stable Respiratory status: spontaneous breathing, nonlabored ventilation, respiratory function stable and patient connected to nasal cannula oxygen Cardiovascular status: blood pressure returned to baseline and stable Postop Assessment: no apparent nausea or vomiting Anesthetic complications: no   There were no known notable events for this encounter.   Last Vitals:  Vitals:   04/13/23 1415 04/13/23 1420  BP:  (!) 141/88  Pulse: 95 94  Resp: (!) 23 (!) 24  Temp:    SpO2: 98% 99%    Last Pain:  Vitals:   04/13/23 1410  TempSrc:   PainSc: 4                  Cleda Mccreedy Namish Krise

## 2023-04-13 NOTE — Discharge Instructions (Addendum)
Your surgeon has performed an operation on your cervical spine (neck) to relieve pressure on the spinal cord and/or nerves. This involved making an incision in the front of your neck and removing one or more of the discs that support your spine. Next, a small piece of bone, a titanium plate, and screws were used to fuse two or more of the vertebrae (bones) together.  The following are instructions to help in your recovery once you have been discharged from the hospital. Even if you feel well, it is important that you follow these activity guidelines. If you do not let your neck heal properly from the surgery, you can increase the chance of return of your symptoms and other complications.  * Do not take anti-inflammatory medications for 3 months after surgery (naproxen [Aleve], ibuprofen [Advil, Motrin], etc.). These medications can prevent your bones from healing properly.  Celebrex, if prescribed, is ok to take.  Activity    No bending, lifting, or twisting ("BLT"). Avoid lifting objects heavier than 10 pounds (gallon milk jug).  Where possible, avoid household activities that involve lifting, bending, reaching, pushing, or pulling such as laundry, vacuuming, grocery shopping, and childcare. Try to arrange for help from friends and family for these activities while your back heals.  Increase physical activity slowly as tolerated.  Taking short walks is encouraged, but avoid strenuous exercise. Do not jog, run, bicycle, lift weights, or participate in any other exercises unless specifically allowed by your doctor.  Talk to your doctor before resuming sexual activity.  You should not drive until cleared by your doctor.  Until released by your doctor, you should not return to work or school.  You should rest at home and let your body heal.   You may shower three days after your surgery.  After showering, lightly dab your incision dry. Do not take a tub bath or go swimming until approved by your  doctor at your follow-up appointment.  If your doctor ordered a cervical collar (neck brace) for you, you should wear it whenever you are out of bed. You may remove it when lying down or sleeping, but you should wear it at all other times. Not all neck surgeries require a cervical collar.  If you smoke, we strongly recommend that you quit.  Smoking has been proven to interfere with normal bone healing and will dramatically reduce the success rate of your surgery. Please contact [facility] (800-QUIT-NOW) and use the resources at www.[facility].com for assistance in stopping smoking.  Surgical Incision   If you have a dressing on your incision, you may remove it two days after your surgery. Keep your incision area clean and dry.  If you have staples or stitches on your incision, you should have a follow up scheduled for removal. If you do not have staples or stitches, you will have steri-strips (small pieces of surgical tape) or Dermabond glue. The steri-strips/glue should begin to peel away within about a week (it is fine if the steri-strips fall off before then). If the strips are still in place one week after your surgery, you may gently remove them.  Diet           You may return to your usual diet. However, you may experience discomfort when swallowing in the first month after your surgery. This is normal. You may find that softer foods are more comfortable for you to swallow. Be sure to stay hydrated.  When to Contact Us  You may experience pain in your   neck and/or pain between your shoulder blades. This is normal and should improve in the next few weeks with the help of pain medication, muscle relaxers, and rest. Some patients report that a warm compress on the back of the neck or between the shoulder blades helps.  However, should you experience any of the following, contact us immediately: New numbness or weakness Pain that is progressively getting worse, and is not relieved by your pain  medication, muscle relaxers, rest, and warm compresses Bleeding, redness, swelling, pain, or drainage from surgical incision Chills or flu-like symptoms Fever greater than 101.0 F (38.3 C) Inability to eat, drink fluids, or take medications Problems with bowel or bladder functions Difficulty breathing or shortness of breath Warmth, tenderness, or swelling in your calf Contact Information How to contact us:  If you have any questions/concerns before or after surgery, you can reach us at [phone], or you can send a mychart message. We can be reached by phone or mychart 8am-4pm, Monday-Friday.  *Please note: Calls after 4pm are forwarded to a third party answering service. Mychart messages are not routinely monitored during evenings, weekends, and holidays. Please call our office to contact the answering service for urgent concerns during non-business hours.    AMBULATORY SURGERY  DISCHARGE INSTRUCTIONS   The drugs that you were given will stay in your system until tomorrow so for the next 24 hours you should not:  Drive an automobile Make any legal decisions Drink any alcoholic beverage   You may resume regular meals tomorrow.  Today it is better to start with liquids and gradually work up to solid foods.  You may eat anything you prefer, but it is better to start with liquids, then soup and crackers, and gradually work up to solid foods.   Please notify your doctor immediately if you have any unusual bleeding, trouble breathing, redness and pain at the surgery site, drainage, fever, or pain not relieved by medication.    Additional Instructions:        Please contact your physician with any problems or Same Day Surgery at [phone], Monday through Friday 6 am to 4 pm, or [place] [facility] Main number at [phone].  

## 2023-04-13 NOTE — Interval H&P Note (Signed)
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  The various methods of treatment have been discussed with the patient and family. After consideration of risks, benefits and other options for treatment, the patient has consented to  Procedure(s): C3-5 ANTERIOR CERVICAL DISCECTOMY AND FUSION (HEDRON) (N/A) as a surgical intervention.  The patient's history has been reviewed, patient examined, no change in status, stable for surgery.  I have reviewed the patient's chart and labs.  Questions were answered to the patient's satisfaction.    Heart sounds normal no MRG. Chest Clear to Auscultation Bilaterally.   Suzanna Zahn

## 2023-04-13 NOTE — Transfer of Care (Signed)
Immediate Anesthesia Transfer of Care Note  Patient: Ashley [name]  Procedure(s) Performed: C3-5 ANTERIOR CERVICAL DISCECTOMY AND FUSION (HEDRON) (Spine Cervical)  Patient Location: PACU  Anesthesia Type:General  Level of Consciousness: drowsy  Airway & Oxygen Therapy: Patient Spontanous Breathing  Post-op Assessment: Report given to RN and Post -op Vital signs reviewed and stable  Post vital signs: Reviewed and stable  Last Vitals:  Vitals Value Taken Time  BP 180/92 04/13/23 1212  Temp    Pulse 83 04/13/23 1214  Resp 11 04/13/23 1214  SpO2 95 % 04/13/23 1214  Vitals shown include unvalidated device data.  Last Pain:  Vitals:   04/13/23 0816  TempSrc: Temporal  PainSc: 0-No pain         Complications: There were no known notable events for this encounter.

## 2023-04-13 NOTE — Discharge Summary (Signed)
Discharge Summary  Patient ID: Ashley [name] MRN: [id number] DOB/AGE: April 01, 1961 62 y.o.  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   Discharge Diagnoses:  Active Problems:   Cervical spinal stenosis   Pseudarthrosis following spinal fusion   Myelopathy [facility])   Discharged Condition: good  Hospital Course:  Ashley [name] is a 62 year old female presenting with symptoms concerning for pseudoarthrosis and cervical myelopathy with corresponding cervical stenosis.  She underwent a C3-5 anterior cervical discectomy and fusion.  Her intraoperative course was uncomplicated.  She was monitored in PACU but ultimately developed postoperative urinary retention.  She was admitted overnight for evaluation after replacement of the Foley.  She underwent a trial of void on the morning on postop day 1 and was able to void independently with minimal residual bladder volumes.  She was seen by therapy and cleared for discharge home on postop day 1.  She was given prescriptions for oxycodone, Celebrex and Robaxin  Consults: None  Significant Diagnostic Studies: none  Treatments: surgery:.  As above. Please see separately dictated operative report for further details  Discharge Exam: Blood pressure (!) 178/89, pulse 100, temperature 97.7 F (36.5 C), temperature source Temporal, resp. rate 18, height 5\' 7"  (1.702 m), weight 66.2 kg, SpO2 100 %.  AA Ox3 CNI   Strength:5/5 throughout  Incision c/d/i  Disposition: Discharge disposition: 01-Home or Self Care        Allergies as of 04/13/2023   No Known Allergies      Medication List     STOP taking these medications    tiZANidine 4 MG tablet Commonly known as: ZANAFLEX       TAKE these medications    ascorbic acid 500 MG tablet Commonly known as: VITAMIN C Take 1 tablet (500 mg total) by mouth daily.    Basaglar KwikPen 100 UNIT/ML Inject 70 Units into the skin daily. What changed:  how much to take additional instructions   glipiZIDE 10 MG 24 hr tablet Commonly known as: GLUCOTROL XL Take 2 tablets (20 mg total) by mouth once daily.   methocarbamol 500 MG tablet Commonly known as: ROBAXIN Take 1 tablet (500 mg total) by mouth every 8 (eight) hours as needed for muscle spasms.   oxyCODONE 5 MG immediate release tablet Commonly known as: Roxicodone Take 1 tablet (5 mg total) by mouth every 4 (four) hours as needed for severe pain.   QUEtiapine 400 MG tablet Commonly known as: SEROQUEL Take 1 tablet (400 mg total) by mouth 2 (two) times daily.   rosuvastatin 40 MG tablet Commonly known as: Crestor Take 1 tablet (40 mg total) by mouth once daily.   TechLite Pen Needles 32G X 4 MM Misc Generic drug: Insulin Pen Needle Use as directed.   True Metrix Blood Glucose Test test strip Generic drug: glucose blood Use to check blood sugar up to 3 times daily.   True Metrix Meter w/Device Kit Use to check blood sugar up to 3 times daily.   TRUEplus Lancets 28G Misc Use to check blood sugar up to 3 times daily.   Trulicity 0.75 MG/0.5ML Sopn Generic drug: Dulaglutide Inject 0.75 mg into the skin once a week.         Signed: Susanne Borders 04/13/2023, 3:56 PM

## 2023-04-13 NOTE — Anesthesia Procedure Notes (Signed)
Procedure Name: Intubation Date/Time: 04/13/2023 10:01 AM  Performed by: Lysbeth Penner, CRNAPre-anesthesia Checklist: Patient identified, Emergency Drugs available, Suction available and Patient being monitored Patient Re-evaluated:Patient Re-evaluated prior to induction Oxygen Delivery Method: Circle system utilized Preoxygenation: Pre-oxygenation with 100% oxygen Induction Type: IV induction Ventilation: Mask ventilation without difficulty Laryngoscope Size: McGraph and 3 Grade View: Grade I Tube type: Oral Tube size: 6.5 mm Number of attempts: 1 Airway Equipment and Method: Stylet and Oral airway Placement Confirmation: ETT inserted through vocal cords under direct vision, positive ETCO2 and breath sounds checked- equal and bilateral Secured at: 20 cm Tube secured with: Tape Dental Injury: Teeth and Oropharynx as per pre-operative assessment

## 2023-04-14 ENCOUNTER — Other Ambulatory Visit: Payer: Medicare Other

## 2023-04-14 ENCOUNTER — Encounter: Payer: Self-pay | Admitting: Neurosurgery

## 2023-04-14 ENCOUNTER — Other Ambulatory Visit (HOSPITAL_COMMUNITY): Payer: Self-pay

## 2023-04-14 DIAGNOSIS — M960 Pseudarthrosis after fusion or arthrodesis: Secondary | ICD-10-CM | POA: Diagnosis not present

## 2023-04-14 LAB — GLUCOSE, CAPILLARY
Glucose-Capillary: 146 mg/dL — ABNORMAL HIGH (ref 70–99)
Glucose-Capillary: 83 mg/dL (ref 70–99)

## 2023-04-14 MED ORDER — CELECOXIB 200 MG PO CAPS
200.0000 mg | ORAL_CAPSULE | Freq: Two times a day (BID) | ORAL | 0 refills | Status: DC
  Filled 2023-04-14: qty 60, 30d supply, fill #0

## 2023-04-14 MED ORDER — CHLORHEXIDINE GLUCONATE CLOTH 2 % EX PADS: 6.0000 | MEDICATED_PAD | Freq: Every day | CUTANEOUS | Status: DC

## 2023-04-14 NOTE — TOC Progression Note (Signed)
Transition of Care [facility]) - Progression Note    Patient Details  Name: Ashley [name] MRN: [id number] Date of Birth: 1961/06/20  Transition of Care [facility]) CM/SW Contact  Garret Reddish, RN Phone Number: 04/14/2023, 3:32 PM  Clinical Narrative:   I spoke with [name].  She informs me that she has a Rolling walker, BSC, and SPC at home.    I have spoken with [name] about home health PT.  [name] reports that she has good family support at home from her children and has declined Home Health PT.    I have informed staff nurse.    Patient has no other TOC needs.           Expected Discharge Plan and Services         Expected Discharge Date: 04/14/23                                     Social Determinants of Health (SDOH) Interventions SDOH Screenings   Food Insecurity: Food Insecurity Present (04/13/2023)  Housing: Low Risk  (04/13/2023)  Transportation Needs: No Transportation Needs (04/13/2023)  Utilities: Not At Risk (04/13/2023)  Alcohol Screen: Low Risk  (04/08/2023)  Depression (PHQ2-9): Medium Risk (04/08/2023)  Financial Resource Strain: Low Risk  (04/08/2023)  Physical Activity: Inactive (04/08/2023)  Social Connections: Moderately Isolated (04/08/2023)  Stress: No Stress Concern Present (04/08/2023)  Tobacco Use: High Risk (04/14/2023)    Readmission Risk Interventions    12/28/2020   11:45 AM  Readmission Risk Prevention Plan  Transportation Screening Complete  PCP or Specialist Appt within 5-7 Days Not Complete  Not Complete comments transition to INPT rehab  Medication Review (RN CM) Complete

## 2023-04-14 NOTE — Evaluation (Signed)
Occupational Therapy Evaluation Patient Details Name: Ashley [name] MRN: [id number] DOB: [birth year] Today's Date: 04/14/2023   History of Present Illness 62 y.o. female with a history of DM, COPD, PVD, anxiety, depression, emphysema, HTN, bipolar, stroke, ACDF C3-C4 3/22, L4-L5 microdiscectomy 7/17 and revision PSF L3-S1 11/18, now s/p C3-C5 ACDF on 64/8/24   Clinical Impression   Pt seen for OT evaluation this date, POD#1 from cervical surgery noted above. Prior to hospital admission, pt was independent with mobility, ADL, and IADL. No falls in past 6 months. Pt lives alone in 2nd floor apartment with her pets. She notes her daughter will provide PRN assist upon discharge. Currently pt requires supv for ADL mobility and is mod indep with dressing and toileting, PRN MIN A for bathing. Pt educated in cervical guidelines, self care skills, AE, pet care considerations, and home/routines modifications to maximize safety and functional independence while minimizing falls risk and maintaining precautions. Pt verbalized understanding of all education/training provided. Handout provided to support recall and carry over of learned precautions/techniques for bed mobility, functional transfers, and self care skills. No additional skilled OT needs at this time. Pt in agreement. Will discharge in house.    Recommendations for follow up therapy are one component of a multi-disciplinary discharge planning process, led by the attending physician.  Recommendations may be updated based on patient status, additional functional criteria and insurance authorization.   Assistance Recommended at Discharge Intermittent Supervision/Assistance  Patient can return home with the following A little help with walking and/or transfers;A little help with bathing/dressing/bathroom;Assistance with cooking/housework;Assist for transportation;Help with stairs or ramp for entrance    Functional Status Assessment  Patient has had a  recent decline in their functional status and demonstrates the ability to make significant improvements in function in a reasonable and predictable amount of time.  Equipment Recommendations  None recommended by OT    Recommendations for Other Services       Precautions / Restrictions Precautions Precautions: Cervical Precaution Booklet Issued: Yes (comment) Precaution Comments: no brace Restrictions Weight Bearing Restrictions: No      Mobility Bed Mobility               General bed mobility comments: NT, up in recliner at start of session    Transfers Overall transfer level: Needs assistance Equipment used: None Transfers: Sit to/from Stand Sit to Stand: Supervision           General transfer comment: SBA      Balance Overall balance assessment: Mild deficits observed, not formally tested                                         ADL either performed or assessed with clinical judgement   ADL Overall ADL's : Modified independent                                       General ADL Comments: Pt able to demo mod indep with simulated LB dressing, supv for toileting transfers.     Vision         Perception     Praxis      Pertinent Vitals/Pain Pain Assessment Pain Assessment: 0-10 Pain Score: 7  Pain Location: neck, improved from 8/10 after pain meds Pain Descriptors / Indicators: Aching Pain Intervention(s): Limited activity  within patient's tolerance, Monitored during session, Premedicated before session, Repositioned     Hand Dominance     Extremity/Trunk Assessment Upper Extremity Assessment Upper Extremity Assessment:  (hx LUE strength and sensory deficits prior to surgery. Pt notes she has not dropped objects in L hand since surgery.)   Lower Extremity Assessment Lower Extremity Assessment: Defer to PT evaluation   Cervical / Trunk Assessment Cervical / Trunk Assessment: Neck Surgery   Communication  Communication Communication: No difficulties   Cognition Arousal/Alertness: Awake/alert Behavior During Therapy: WFL for tasks assessed/performed Overall Cognitive Status: Within Functional Limits for tasks assessed                                       General Comments       Exercises Other Exercises Other Exercises: Pt educated in cervical guidelines for movement during ADL and mobility, pet care considerations, falls prevention, and home/routines modifications. Handout provided.   Shoulder Instructions      Home Living Family/patient expects to be discharged to:: Private residence Living Arrangements: Alone;Other (Comment) (2 dogs, 1 cat) Available Help at Discharge: Family;Available PRN/intermittently (daughter plans to stop in to assist PRN) Type of Home: Apartment Home Access: Stairs to enter [employer] of Steps: 12 Entrance Stairs-Rails: Right Home Layout: One level     Bathroom Shower/Tub: Chief Strategy Officer: Standard     Home Equipment: Cane - single point          Prior Functioning/Environment Prior Level of Function : Independent/Modified Independent;Driving             Mobility Comments: no AD, no falls ADLs Comments: indep, driving        OT Problem List: Pain;Impaired balance (sitting and/or standing);Decreased knowledge of use of DME or AE      OT Treatment/Interventions:      OT Goals(Current goals can be found in the care plan section) Acute Rehab OT Goals Patient Stated Goal: go home to pets OT Goal Formulation: All assessment and education complete, DC therapy  OT Frequency:      Co-evaluation              AM-PAC OT "6 Clicks" Daily Activity     Outcome Measure Help from another person eating meals?: None Help from another person taking care of personal grooming?: None Help from another person toileting, which includes using toliet, bedpan, or urinal?: None Help from another person  bathing (including washing, rinsing, drying)?: A Little Help from another person to put on and taking off regular upper body clothing?: None Help from another person to put on and taking off regular lower body clothing?: None 6 Click Score: 23   End of Session Equipment Utilized During Treatment: Gait belt  Activity Tolerance: Patient tolerated treatment well Patient left: in bed;with call bell/phone within reach;Other (comment) (seated EOB with PT for session)  OT Visit Diagnosis: Other abnormalities of gait and mobility (R26.89);Pain Pain - part of body:  (neck)                Time: 9147-8295 OT Time Calculation (min): 15 min Charges:  OT General Charges $OT Visit: 1 Visit OT Evaluation $OT Eval Low Complexity: 1 Low OT Treatments $Self Care/Home Management : 8-22 mins  Arman Filter., MPH, MS, OTR/L ascom [phone] 04/14/23, 9:48 AM

## 2023-04-14 NOTE — Progress Notes (Signed)
Dr. Myer Haff secure paged this am re:   Pt admitted from recovery for overnight monitoring d/t inability to void after her procedure 7/8.  She was I&O cathed x3, and foley was placed in recovery before transfer d/t high volume bladder scans.   Made MD aware that pt does have the standard orders in place to remove foley on POD #1.    MD advised to keep foley in place this am.   Foley remains in place, CDI, no complications.

## 2023-04-14 NOTE — Evaluation (Addendum)
Physical Therapy Evaluation Patient Details Name: Ashley [name] MRN: [id number] DOB: July 14, 1961 Today's Date: 04/14/2023  History of Present Illness  62 y.o. female with a history of DM, COPD, PVD, anxiety, depression, emphysema, HTN, bipolar, stroke, ACDF C3-C4 3/22, L4-L5 microdiscectomy 7/17 and revision PSF L3-S1 11/18, now s/p C3-C5 ACDF on 04/13/23.  Clinical Impression   Pt presents seated EOB following OT visit, pain at rest is 0/10. She lives in a 2nd story apartment and has a full flight of stairs to enter with a rail on the R side. She also mentioned a small hill outside when walking from her car to the stairs. Her daughter will be able to assist her PRN following discharge. She utilizes a SPC to ambulate from her apartment to her car but does not use it for community ambulation. For long distances, she will take multiple rest breaks as needed. She also has a RW and BSC.   PT evaluated gross cervical ROM, noting limited motion with rotation B/L and 7/10 pain when turning to the L. Gross RUE ROM noted limitations in flex/abd with a noteable shoulder shrug past 90*. Pt nervous to move LUE and mentioned she is unable to move it due to heaviness and weakness. LE MMTs noted gross weakness, particularly in L hip flexion (3+/5) and R knee flex/ ext (3+/5). Pt was able to perform sit> supine Modi as well as sit<>stand transfers and ambulate 271ft in the hallway with CGA and no AD. During hall ambulation, occasional stagger /cross over steps noted and pt mentioned she was feeling woozy due to medication. PT encouraged use of SPC for the time being following d/c to help with balance. Pt would benefit from continued therapy to address current UE weakness, balance deficits, and maximize functional independence.      Assistance Recommended at Discharge PRN  If plan is discharge home, recommend the following:  Can travel by private vehicle  A little help with walking and/or transfers;A little help with  bathing/dressing/bathroom;Assistance with cooking/housework;Assist for transportation;Help with stairs or ramp for entrance        Equipment Recommendations None recommended by PT (Pt has SPC and RW at home)  Recommendations for Other Services       Functional Status Assessment Patient has had a recent decline in their functional status and demonstrates the ability to make significant improvements in function in a reasonable and predictable amount of time.     Precautions / Restrictions Precautions Precautions: Cervical Precaution Booklet Issued: Yes (comment) Precaution Comments: no brace, encouraged to move within pain free ROM Restrictions Weight Bearing Restrictions: No      Mobility  Bed Mobility Overal bed mobility: Modified Independent Bed Mobility: Sit to Supine       Sit to supine: Modified independent (Device/Increase time)        Transfers Overall transfer level: Needs assistance Equipment used: None Transfers: Sit to/from Stand Sit to Stand: Min guard (CGA)                Ambulation/Gait Ambulation/Gait assistance: Min guard (CGA for balance) Gait Distance (Feet): 220 Feet Assistive device: None         General Gait Details: occasional staggering/foot cross over while walking, pt verbalized she feels a bit woozy from medication. Encouraged to utilize [facility] while ambulating for the time being to help with balance  Stairs            Wheelchair Mobility     Tilt Bed    Modified Rankin (  Stroke Patients Only)       Balance Overall balance assessment: Needs assistance Sitting-balance support: Feet supported, Single extremity supported, Bilateral upper extremity supported Sitting balance-Leahy Scale: Fair     Standing balance support: No upper extremity supported, During functional activity Standing balance-Leahy Scale: Fair                               Pertinent Vitals/Pain Pain Assessment Pain Score: 7  Pain  Location: neck during cervical rotation to the L, 0/10 at rest Pain Descriptors / Indicators: Sharp, Radiating Pain Intervention(s): Monitored during session, Repositioned    Home Living Family/patient expects to be discharged to:: Private residence Living Arrangements: Alone;Other (Comment) (2 dogs 1 cat) Available Help at Discharge: Family;Available PRN/intermittently Type of Home: Apartment Home Access: Stairs to enter Entrance Stairs-Rails: Right Entrance Stairs-Number of Steps: 1 flight of stairs, lives on the 2nd floor   Home Layout: One level Home Equipment: Cane - single point Additional Comments: Has to walk up small hill to get to stairs at her apartment building    Prior Function Prior Level of Function : Independent/Modified Independent             Mobility Comments: used SPC to get to car but does not use for community ambulation, takes rest breaks as needed if walking long distances. Has RW in car if needed but hasn't used it recently ADLs Comments: indep, driving     Hand Dominance        Extremity/Trunk Assessment   Upper Extremity Assessment Upper Extremity Assessment: LUE deficits/detail;RUE deficits/detail RUE Deficits / Details: RUE unable to reach full ROM for flex/abd in seated. Prevalent shoulder shrug past 90 degrees LUE Deficits / Details: Unable to lift L arm into flex/abd, pt mentioned it feels heavy and is unable to move it. Elbow flex/ext is [facility]    Lower Extremity Assessment Lower Extremity Assessment: RLE deficits/detail;LLE deficits/detail (Simultaneous filing. User may not have seen previous data.) RLE Deficits / Details: MMTs: hip flexion 4/5, Knee flexion 4/5, Knee extension 3+/5 LLE Deficits / Details: MMTs:hip flexion 3+/5, Knee flexion 4/5, Knee extension 4/5    Cervical / Trunk Assessment Cervical / Trunk Assessment: Neck Surgery  Communication   Communication: No difficulties  Cognition Arousal/Alertness: Awake/alert Behavior  During Therapy: WFL for tasks assessed/performed Overall Cognitive Status: Within Functional Limits for tasks assessed                                          General Comments      Exercises General Exercises - Upper Extremity Shoulder Flexion: AROM, Seated, Limitations, Both Shoulder Flexion Limitations: RUE unable to reach full ROM, Prevalent shoulder shrug past 90 degrees but has been able to complete tasks independently prior to admission. LUE unable to lift, pt mentioned it feels heavy and is unable to move it. Shoulder ABduction: AROM, Seated, Limitations, Both Shoulder Abduction Limitations: RUE unable to reach full ROM, Prevalent shoulder shrug past 90 degrees but has been able to complete tasks independently prior to admission. LUE unable to lift, pt mentioned it feels heavy and is unable to move it. Elbow Flexion: AROM, Both, Seated (WFL) Elbow Extension: AROM, Both, Seated (WFL)   Assessment/Plan    PT Assessment Patient needs continued PT services  PT Problem List Decreased strength;Decreased range of motion;Decreased balance;Decreased mobility;Decreased knowledge  of use of DME       PT Treatment Interventions Balance training;DME instruction;Neuromuscular re-education;Gait training;Stair training;Functional mobility training;Patient/family education    PT Goals (Current goals can be found in the Care Plan section)  Acute Rehab PT Goals Patient Stated Goal: Return home PT Goal Formulation: With patient Time For Goal Achievement: 04/16/23 Potential to Achieve Goals: Good    Frequency Min 1X/week     Co-evaluation               AM-PAC PT "6 Clicks" Mobility  Outcome Measure Help needed turning from your back to your side while in a flat bed without using bedrails?: A Little Help needed moving from lying on your back to sitting on the side of a flat bed without using bedrails?: A Little Help needed moving to and from a bed to a chair  (including a wheelchair)?: A Little Help needed standing up from a chair using your arms (e.g., wheelchair or bedside chair)?: A Little Help needed to walk in hospital room?: A Little Help needed climbing 3-5 steps with a railing? : A Little 6 Click Score: 18    End of Session Equipment Utilized During Treatment: Gait belt Activity Tolerance: Patient tolerated treatment well Patient left: in bed;with call bell/phone within reach;with bed alarm set Nurse Communication: Mobility status PT Visit Diagnosis: Muscle weakness (generalized) (M62.81);Unsteadiness on feet (R26.81);Pain Pain - Right/Left: Left Pain - part of body: Shoulder;Arm (Neck)    Time: 5638-7564 PT Time Calculation (min) (ACUTE ONLY): 20 min   Charges:   PT Evaluation $PT Eval Low Complexity: 1 Low PT Treatments $Therapeutic Activity: 8-22 mins PT General Charges $$ ACUTE PT VISIT: 1 Visit        Jaydrien Wassenaar, PT, SPT 12:19 PM,04/14/23

## 2023-04-14 NOTE — Progress Notes (Signed)
   Neurosurgery Progress Note  History: JESSAMYN WATTERSON is s/p C3-5 CDF for pseudoarthrosis and stenosis.  POD1: Pt doing well from a surgical standpoint however she has experienced postoperative urinary retention overnight requiring replacement of her Foley catheter.  She is otherwise doing well and would like to go home.  Physical Exam: Vitals:   04/14/23 0415 04/14/23 0755  BP: 137/86 (!) 147/89  Pulse: 89 94  Resp: 16 18  Temp: 98.2 F (36.8 C) 99.3 F (37.4 C)  SpO2: 98% 100%    AA Ox3 CNI  Strength:5/5 throughout  Incision c/d/i  Data:  Other tests/results: none  Assessment/Plan:  CASEY MAXFIELD is a 62 year old presenting with cervical stenosis and pseudoarthrosis status post C 3-5 ACDF aided by acute postoperative urinary retention requiring replacement of Foley.  - mobilize - pain control - DVT prophylaxis - PTOT - will attempt trial of void this morning.  Should this be unsuccessful, we will likely replace her Foley and send her home with plans for outpatient urology follow-up.  Manning Charity PA-C Department of Neurosurgery

## 2023-04-15 ENCOUNTER — Ambulatory Visit
Admission: RE | Admit: 2023-04-15 | Discharge: 2023-04-15 | Disposition: A | Discharge status: Home / Self Care | Payer: Medicare Other | Source: Ambulatory Visit | Attending: Critical Care Medicine | Admitting: Critical Care Medicine

## 2023-04-15 ENCOUNTER — Telehealth: Payer: Self-pay

## 2023-04-15 DIAGNOSIS — Z1231 Encounter for screening mammogram for malignant neoplasm of breast: Secondary | ICD-10-CM

## 2023-04-15 NOTE — Transitions of Care (Post Inpatient/ED Visit) (Signed)
   04/15/2023  Name: Ashley [name] MRN: [id number] DOB: 11-29-1960  Today's TOC FU Call Status: Today's TOC FU Call Status:: Unsuccessul Call (1st Attempt) Unsuccessful Call (1st Attempt) Date: 04/15/23  Attempted to reach the patient regarding the most recent Inpatient/ED visit.  Follow Up Plan: Additional outreach attempts will be made to reach the patient to complete the Transitions of Care (Post Inpatient/ED visit) call.   Signature  Robyne Peers, RN

## 2023-04-16 ENCOUNTER — Telehealth: Payer: Self-pay

## 2023-04-16 NOTE — Transitions of Care (Post Inpatient/ED Visit) (Signed)
   04/16/2023  Name: Ashley [name] MRN: [id number] DOB: 09-Apr-1961  Today's TOC FU Call Status: Today's TOC FU Call Status:: Unsuccessful Call (2nd Attempt) Unsuccessful Call (1st Attempt) Date: 04/15/23 Unsuccessful Call (2nd Attempt) Date: 04/16/23  Attempted to reach the patient regarding the most recent Inpatient/ED visit.  Follow Up Plan: Additional outreach attempts will be made to reach the patient to complete the Transitions of Care (Post Inpatient/ED visit) call.   Signature  Robyne Peers, RN

## 2023-04-19 NOTE — Progress Notes (Signed)
Let pt know mammogram normal rechk in 1 yr

## 2023-04-20 ENCOUNTER — Telehealth: Payer: Medicare Other | Admitting: Pharmacist

## 2023-04-20 ENCOUNTER — Telehealth: Payer: Self-pay

## 2023-04-20 ENCOUNTER — Telehealth: Payer: Self-pay | Admitting: Pharmacist

## 2023-04-20 NOTE — Telephone Encounter (Signed)
Called patient to complete her telephone visit. I was unable to reach the patient and her VM did not give me an option to leave a message. It looks like our RN Case Manager here was unable to successfully connect with her as well.   Butch Penny, PharmD, Patsy [place], CPP Clinical Pharmacist [facility] [phone]

## 2023-04-20 NOTE — Telephone Encounter (Signed)
-----   Message from Shan Levans sent at 04/19/2023  9:07 AM EDT ----- Let pt know mammogram normal rechk in 1 yr

## 2023-04-20 NOTE — Transitions of Care (Post Inpatient/ED Visit) (Signed)
   04/20/2023  Name: Ashley [name] MRN: [id number] DOB: 12-07-1960  Today's TOC FU Call Status: Today's TOC FU Call Status:: Unsuccessful Call (3rd Attempt) Unsuccessful Call (1st Attempt) Date: 04/15/23 Unsuccessful Call (2nd Attempt) Date: 04/16/23 Unsuccessful Call (3rd Attempt) Date: 04/20/23  Attempted to reach the patient regarding the most recent Inpatient/ED visit.  Follow Up Plan: No further outreach attempts will be made at this time. We have been unable to contact the patient.  Letter sent to patient requesting she contact [facility] to schedule a follow up appointment as we have not been able to reach her   Signature  Robyne Peers, RN

## 2023-04-20 NOTE — Telephone Encounter (Signed)
Called patient unable to make contact or leave voicemail, information sent to the nurse pool

## 2023-04-23 ENCOUNTER — Other Ambulatory Visit (HOSPITAL_COMMUNITY): Payer: Self-pay

## 2023-04-24 NOTE — Progress Notes (Deleted)
   REFERRING PHYSICIAN:  Storm Frisk, Md [address] [place],  [place] [address]  DOS: 04/13/23 ACDF C3-C5 for pseudoarthrosis and stenosis   HISTORY OF PRESENT ILLNESS: Ashley [name] is 2 weeks status post ACDF C3-C5. Given celebrex, robaxin, and oxycodone on discharge from the hospital.         PHYSICAL EXAMINATION:  NEUROLOGICAL:  General: In no acute distress.   Awake, alert, oriented to person, place, and time.  Pupils equal round and reactive to light.  Facial tone is symmetric.    Strength: Side Biceps Triceps Deltoid Interossei Grip Wrist Ext. Wrist Flex.  R 5 5 5 5 5 5 5   L 5 5 5 5 5 5 5    Incision c/d/i  Imaging:  Nothing new to review.   Assessment / Plan: Ashley [name] is doing well s/p above surgery. Treatment options reviewed with patient and following plan made:   - We discussed activity escalation and I have advised the patient to lift up to 10 pounds until 6 weeks after surgery (until follow up with Dr. Myer Haff).   - Reviewed wound care.  - Continue current medications including *** - Follow up as scheduled in 4 weeks and prn.   Advised to contact the office if any questions or concerns arise.   Drake Leach PA-C Dept of Neurosurgery

## 2023-04-27 ENCOUNTER — Encounter: Payer: Medicare Other | Admitting: Orthopedic Surgery

## 2023-04-27 DIAGNOSIS — M4802 Spinal stenosis, cervical region: Secondary | ICD-10-CM

## 2023-04-27 DIAGNOSIS — Z981 Arthrodesis status: Secondary | ICD-10-CM

## 2023-04-27 DIAGNOSIS — M960 Pseudarthrosis after fusion or arthrodesis: Secondary | ICD-10-CM

## 2023-04-28 ENCOUNTER — Telehealth: Payer: Self-pay | Admitting: Orthopedic Surgery

## 2023-04-28 ENCOUNTER — Other Ambulatory Visit: Payer: Self-pay

## 2023-04-28 ENCOUNTER — Other Ambulatory Visit: Payer: Self-pay | Admitting: Critical Care Medicine

## 2023-04-28 NOTE — Telephone Encounter (Signed)
Medication Refill - Medication: QUEtiapine (SEROQUEL) 400 MG tablet Pt states that she is out of medication and it is urgent for her to get her refill.   Has the patient contacted their pharmacy? Yes.     Preferred Pharmacy (with phone number or street name):  [facility] [address], [place] [address] Phone: [phone]  Fax: [phone]   Has the patient been seen for an appointment in the last year OR does the patient have an upcoming appointment? Yes.    Agent: Please be advised that RX refills may take up to 3 business days. We ask that you follow-up with your pharmacy.

## 2023-04-28 NOTE — Telephone Encounter (Signed)
-----   Message from Cristin R sent at 04/28/2023 10:36 AM EDT ----- Looks like her phone is disconnected I will mail her the next post op visits and a letter regarding her missed appt ----- Message ----- From: Jana Half Sent: 04/27/2023   3:25 PM EDT To: [facility]  Call cannot be completed ----- Message ----- From: Drake Leach, PA-C Sent: 04/27/2023   3:08 PM EDT To: [facility] Admin  She missed her 2 week postop with me today. Please call to see if you can reschedule it.   Thanks!

## 2023-04-29 ENCOUNTER — Other Ambulatory Visit (HOSPITAL_COMMUNITY): Payer: Self-pay

## 2023-04-29 ENCOUNTER — Other Ambulatory Visit: Payer: Self-pay | Admitting: Critical Care Medicine

## 2023-04-29 ENCOUNTER — Other Ambulatory Visit: Payer: Self-pay

## 2023-04-29 MED ORDER — QUETIAPINE FUMARATE 400 MG PO TABS
400.0000 mg | ORAL_TABLET | Freq: Two times a day (BID) | ORAL | 0 refills | Status: DC
  Filled 2023-04-29: qty 60, 30d supply, fill #0

## 2023-04-29 NOTE — Telephone Encounter (Signed)
Requested medication (s) are due for refill today: yes  Requested medication (s) are on the active medication list:yes  Last refill:  02/19/23 #60 1 RF  Future visit scheduled: yes  Notes to clinic:  med not delegated to NT to RF   Requested Prescriptions  Pending Prescriptions Disp Refills   QUEtiapine (SEROQUEL) 400 MG tablet 60 tablet 1    Sig: Take 1 tablet (400 mg total) by mouth 2 (two) times daily.     Not Delegated - Psychiatry:  Antipsychotics - Second Generation (Atypical) - quetiapine Failed - 04/28/2023  1:59 PM      Failed - This refill cannot be delegated      Failed - TSH in normal range and within 360 days    TSH  Date Value Ref Range Status  11/12/2020 0.970 0.450 - 4.500 uIU/mL Final         Failed - Last BP in normal range    BP Readings from Last 1 Encounters:  04/14/23 (!) 145/93         Failed - Lipid Panel in normal range within the last 12 months    Cholesterol, Total  Date Value Ref Range Status  11/11/2022 153 100 - 199 mg/dL Final   LDL Chol Calc (NIH)  Date Value Ref Range Status  11/11/2022 77 0 - 99 mg/dL Final   HDL  Date Value Ref Range Status  11/11/2022 41 >39 mg/dL Final   Triglycerides  Date Value Ref Range Status  11/11/2022 210 (H) 0 - 149 mg/dL Final         Failed - CBC within normal limits and completed in the last 12 months    WBC  Date Value Ref Range Status  03/31/2023 7.3 4.0 - 10.5 K/uL Final   RBC  Date Value Ref Range Status  03/31/2023 4.04 3.87 - 5.11 MIL/uL Final   Hemoglobin  Date Value Ref Range Status  03/31/2023 12.3 12.0 - 15.0 g/dL Final  32/95/1884 16.6 11.1 - 15.9 g/dL Final   HCT  Date Value Ref Range Status  03/31/2023 39.0 36.0 - 46.0 % Final   Hematocrit  Date Value Ref Range Status  04/11/2021 43.2 34.0 - 46.6 % Final   MCHC  Date Value Ref Range Status  03/31/2023 31.5 30.0 - 36.0 g/dL Final   [facility]  Date Value Ref Range Status  03/31/2023 30.4 26.0 - 34.0 pg Final   MCV  Date  Value Ref Range Status  03/31/2023 96.5 80.0 - 100.0 fL Final  04/11/2021 92 79 - 97 fL Final   No results found for: "PLTCOUNTKUC", "LABPLAT", "POCPLA" RDW  Date Value Ref Range Status  03/31/2023 14.9 11.5 - 15.5 % Final  04/11/2021 15.6 (H) 11.7 - 15.4 % Final         Failed - CMP within normal limits and completed in the last 12 months    Albumin  Date Value Ref Range Status  11/11/2022 4.7 3.9 - 4.9 g/dL Final   Alkaline Phosphatase  Date Value Ref Range Status  04/19/2021 164 (H) 44 - 121 IU/L Final   ALT  Date Value Ref Range Status  12/29/2020 60 (H) 0 - 44 U/L Final   AST  Date Value Ref Range Status  04/19/2021 42 (H) 0 - 40 IU/L Final   BUN  Date Value Ref Range Status  03/31/2023 17 8 - 23 mg/dL Final  04/04/1600 13 8 - 27 mg/dL Final   Calcium  Date Value Ref  Range Status  03/31/2023 9.3 8.9 - 10.3 mg/dL Final   CO2  Date Value Ref Range Status  03/31/2023 25 22 - 32 mmol/L Final   Bicarbonate  Date Value Ref Range Status  05/18/2017 20.4 20.0 - 28.0 mmol/L Final   TCO2  Date Value Ref Range Status  05/17/2017 22 0 - 100 mmol/L Final   Creat  Date Value Ref Range Status  08/04/2016 0.88 0.50 - 1.05 mg/dL Final    Comment:      For patients > or = 64 years of age: The upper reference limit for Creatinine is approximately 13% higher for people identified as African-American.      Creatinine, Ser  Date Value Ref Range Status  03/31/2023 0.89 0.44 - 1.00 mg/dL Final   Creatinine, Urine  Date Value Ref Range Status  07/02/2016 CANCELED 20 - 320 mg/dL     Comment:    Test not performed, no urine was received.    Result canceled by the ancillary    Glucose, Bld  Date Value Ref Range Status  04/13/2023 407 (H) 70 - 99 mg/dL Final    Comment:    Glucose reference range applies only to samples taken after fasting for at least 8 hours. Performed at [facility], [address] [place], [place] [address]    POC Glucose   Date Value Ref Range Status  01/13/2023 129 (A) 70 - 99 mg/dl Final   Glucose-Capillary  Date Value Ref Range Status  04/14/2023 83 70 - 99 mg/dL Final    Comment:    Glucose reference range applies only to samples taken after fasting for at least 8 hours.   Potassium  Date Value Ref Range Status  03/31/2023 3.7 3.5 - 5.1 mmol/L Final   Sodium  Date Value Ref Range Status  03/31/2023 141 135 - 145 mmol/L Final  11/11/2022 144 134 - 144 mmol/L Final   Bilirubin Total  Date Value Ref Range Status  04/19/2021 <0.2 0.0 - 1.2 mg/dL Final   Bilirubin, Direct  Date Value Ref Range Status  05/17/2017 <0.1 (L) 0.1 - 0.5 mg/dL Final   Indirect Bilirubin  Date Value Ref Range Status  05/17/2017 NOT CALCULATED 0.3 - 0.9 mg/dL Final   Protein, ur  Date Value Ref Range Status  12/30/2020 NEGATIVE NEGATIVE mg/dL Final   Total Protein  Date Value Ref Range Status  04/19/2021 7.3 6.0 - 8.5 g/dL Final   GFR calc Af Amer  Date Value Ref Range Status  11/12/2020 72 >59 mL/min/1.73 Final    Comment:    **In accordance with recommendations from the NKF-ASN Task force,**   [facility] is in the process of updating its eGFR calculation to the   2021 CKD-EPI creatinine equation that estimates kidney function   without a race variable.    eGFR  Date Value Ref Range Status  11/11/2022 70 >59 mL/min/1.73 Final   GFR, Estimated  Date Value Ref Range Status  03/31/2023 >60 >60 mL/min Final    Comment:    (NOTE) Calculated using the CKD-EPI Creatinine Equation (2021)          Passed - Completed PHQ-2 or PHQ-9 in the last 360 days      Passed - Last Heart Rate in normal range    Pulse Readings from Last 1 Encounters:  04/14/23 (!) 103         Passed - Valid encounter within last 6 months    Recent Outpatient Visits  1 month ago Type 2 diabetes mellitus with hyperglycemia, with long-term current use of insulin [facility])   [place] [facility] Dana, [place] L, RPH-CPP   2 months ago Uncontrolled type 2 diabetes mellitus with hyperglycemia [facility])   [place] [facility] [place], [place] L, RPH-CPP   3 months ago Uncontrolled type 2 diabetes mellitus with hyperglycemia [facility])   [place] [facility] Storm Frisk, MD   5 months ago Type 2 diabetes mellitus with hyperglycemia, with long-term current use of insulin [facility])   [place] [facility] Storm Frisk, MD   6 months ago Mixed dyslipidemia   [facility] Drucilla Chalet, RPH-CPP       Future Appointments             In 2 weeks Storm Frisk, MD [facility]

## 2023-04-29 NOTE — Telephone Encounter (Signed)
Medication Refill - Medication: QUEtiapine (SEROQUEL) 400 MG tablet   Has the patient contacted their pharmacy? Yes.    Preferred Pharmacy (with phone number or street name):  [facility] Phone: [phone]  Fax: [phone]     Has the patient been seen for an appointment in the last year OR does the patient have an upcoming appointment? Yes.    Agent: Please be advised that RX refills may take up to 3 business days. We ask that you follow-up with your pharmacy.

## 2023-04-30 ENCOUNTER — Other Ambulatory Visit: Payer: Self-pay

## 2023-04-30 ENCOUNTER — Other Ambulatory Visit (HOSPITAL_COMMUNITY): Payer: Self-pay

## 2023-04-30 MED ORDER — QUETIAPINE FUMARATE 400 MG PO TABS
400.0000 mg | ORAL_TABLET | Freq: Two times a day (BID) | ORAL | 0 refills | Status: DC
  Filled 2023-04-30: qty 60, 30d supply, fill #0

## 2023-04-30 NOTE — Telephone Encounter (Signed)
Requested medication (s) are due for refill today - no  Requested medication (s) are on the active medication list -yes  Future visit scheduled -yes  Last refill: 04/29/23 #60  Notes to clinic: non delegated Rx, duplicate request  Requested Prescriptions  Pending Prescriptions Disp Refills   QUEtiapine (SEROQUEL) 400 MG tablet 60 tablet 1    Sig: Take 1 tablet (400 mg total) by mouth 2 (two) times daily.     Not Delegated - Psychiatry:  Antipsychotics - Second Generation (Atypical) - quetiapine Failed - 04/29/2023  1:50 PM      Failed - This refill cannot be delegated      Failed - TSH in normal range and within 360 days    TSH  Date Value Ref Range Status  11/12/2020 0.970 0.450 - 4.500 uIU/mL Final         Failed - Last BP in normal range    BP Readings from Last 1 Encounters:  04/14/23 (!) 145/93         Failed - Lipid Panel in normal range within the last 12 months    Cholesterol, Total  Date Value Ref Range Status  11/11/2022 153 100 - 199 mg/dL Final   LDL Chol Calc (NIH)  Date Value Ref Range Status  11/11/2022 77 0 - 99 mg/dL Final   HDL  Date Value Ref Range Status  11/11/2022 41 >39 mg/dL Final   Triglycerides  Date Value Ref Range Status  11/11/2022 210 (H) 0 - 149 mg/dL Final         Failed - CBC within normal limits and completed in the last 12 months    WBC  Date Value Ref Range Status  03/31/2023 7.3 4.0 - 10.5 K/uL Final   RBC  Date Value Ref Range Status  03/31/2023 4.04 3.87 - 5.11 MIL/uL Final   Hemoglobin  Date Value Ref Range Status  03/31/2023 12.3 12.0 - 15.0 g/dL Final  16/07/9603 54.0 11.1 - 15.9 g/dL Final   HCT  Date Value Ref Range Status  03/31/2023 39.0 36.0 - 46.0 % Final   Hematocrit  Date Value Ref Range Status  04/11/2021 43.2 34.0 - 46.6 % Final   MCHC  Date Value Ref Range Status  03/31/2023 31.5 30.0 - 36.0 g/dL Final   [facility]  Date Value Ref Range Status  03/31/2023 30.4 26.0 - 34.0 pg Final   MCV  Date  Value Ref Range Status  03/31/2023 96.5 80.0 - 100.0 fL Final  04/11/2021 92 79 - 97 fL Final   No results found for: "PLTCOUNTKUC", "LABPLAT", "POCPLA" RDW  Date Value Ref Range Status  03/31/2023 14.9 11.5 - 15.5 % Final  04/11/2021 15.6 (H) 11.7 - 15.4 % Final         Failed - CMP within normal limits and completed in the last 12 months    Albumin  Date Value Ref Range Status  11/11/2022 4.7 3.9 - 4.9 g/dL Final   Alkaline Phosphatase  Date Value Ref Range Status  04/19/2021 164 (H) 44 - 121 IU/L Final   ALT  Date Value Ref Range Status  12/29/2020 60 (H) 0 - 44 U/L Final   AST  Date Value Ref Range Status  04/19/2021 42 (H) 0 - 40 IU/L Final   BUN  Date Value Ref Range Status  03/31/2023 17 8 - 23 mg/dL Final  98/08/9146 13 8 - 27 mg/dL Final   Calcium  Date Value Ref Range Status  03/31/2023 9.3  8.9 - 10.3 mg/dL Final   CO2  Date Value Ref Range Status  03/31/2023 25 22 - 32 mmol/L Final   Bicarbonate  Date Value Ref Range Status  05/18/2017 20.4 20.0 - 28.0 mmol/L Final   TCO2  Date Value Ref Range Status  05/17/2017 22 0 - 100 mmol/L Final   Creat  Date Value Ref Range Status  08/04/2016 0.88 0.50 - 1.05 mg/dL Final    Comment:      For patients > or = 64 years of age: The upper reference limit for Creatinine is approximately 13% higher for people identified as African-American.      Creatinine, Ser  Date Value Ref Range Status  03/31/2023 0.89 0.44 - 1.00 mg/dL Final   Creatinine, Urine  Date Value Ref Range Status  07/02/2016 CANCELED 20 - 320 mg/dL     Comment:    Test not performed, no urine was received.    Result canceled by the ancillary    Glucose, Bld  Date Value Ref Range Status  04/13/2023 407 (H) 70 - 99 mg/dL Final    Comment:    Glucose reference range applies only to samples taken after fasting for at least 8 hours. Performed at [facility], [address] [place], [place] [address]    POC Glucose   Date Value Ref Range Status  01/13/2023 129 (A) 70 - 99 mg/dl Final   Glucose-Capillary  Date Value Ref Range Status  04/14/2023 83 70 - 99 mg/dL Final    Comment:    Glucose reference range applies only to samples taken after fasting for at least 8 hours.   Potassium  Date Value Ref Range Status  03/31/2023 3.7 3.5 - 5.1 mmol/L Final   Sodium  Date Value Ref Range Status  03/31/2023 141 135 - 145 mmol/L Final  11/11/2022 144 134 - 144 mmol/L Final   Bilirubin Total  Date Value Ref Range Status  04/19/2021 <0.2 0.0 - 1.2 mg/dL Final   Bilirubin, Direct  Date Value Ref Range Status  05/17/2017 <0.1 (L) 0.1 - 0.5 mg/dL Final   Indirect Bilirubin  Date Value Ref Range Status  05/17/2017 NOT CALCULATED 0.3 - 0.9 mg/dL Final   Protein, ur  Date Value Ref Range Status  12/30/2020 NEGATIVE NEGATIVE mg/dL Final   Total Protein  Date Value Ref Range Status  04/19/2021 7.3 6.0 - 8.5 g/dL Final   GFR calc Af Amer  Date Value Ref Range Status  11/12/2020 72 >59 mL/min/1.73 Final    Comment:    **In accordance with recommendations from the NKF-ASN Task force,**   [facility] is in the process of updating its eGFR calculation to the   2021 CKD-EPI creatinine equation that estimates kidney function   without a race variable.    eGFR  Date Value Ref Range Status  11/11/2022 70 >59 mL/min/1.73 Final   GFR, Estimated  Date Value Ref Range Status  03/31/2023 >60 >60 mL/min Final    Comment:    (NOTE) Calculated using the CKD-EPI Creatinine Equation (2021)          Passed - Completed PHQ-2 or PHQ-9 in the last 360 days      Passed - Last Heart Rate in normal range    Pulse Readings from Last 1 Encounters:  04/14/23 (!) 103         Passed - Valid encounter within last 6 months    Recent Outpatient Visits  1 month ago Type 2 diabetes mellitus with hyperglycemia, with long-term current use of insulin [facility])   [place] [facility] [place], [place] L, RPH-CPP   2 months ago Uncontrolled type 2 diabetes mellitus with hyperglycemia [facility])   [place] [facility] [place], Edmund L, RPH-CPP   3 months ago Uncontrolled type 2 diabetes mellitus with hyperglycemia [facility])   [place] Storm Frisk, MD   5 months ago Type 2 diabetes mellitus with hyperglycemia, with long-term current use of insulin [facility])    [facility] Storm Frisk, MD   6 months ago Mixed dyslipidemia   [facility] [place], Cornelius Moras, RPH-CPP       Future Appointments             In 2 weeks Storm Frisk, MD  [facility]               Requested Prescriptions  Pending Prescriptions Disp Refills   QUEtiapine (SEROQUEL) 400 MG tablet 60 tablet 1    Sig: Take 1 tablet (400 mg total) by mouth 2 (two) times daily.     Not Delegated - Psychiatry:  Antipsychotics - Second Generation (Atypical) - quetiapine Failed - 04/29/2023  1:50 PM      Failed - This refill cannot be delegated      Failed - TSH in normal range and within 360 days    TSH  Date Value Ref Range Status  11/12/2020 0.970 0.450 - 4.500 uIU/mL Final         Failed - Last BP in normal range    BP Readings from Last 1 Encounters:  04/14/23 (!) 145/93         Failed - Lipid Panel in normal range within the last 12 months    Cholesterol, Total  Date Value Ref Range Status  11/11/2022 153 100 - 199 mg/dL Final   LDL Chol Calc (NIH)  Date Value Ref Range Status  11/11/2022 77 0 - 99 mg/dL Final   HDL  Date Value Ref Range Status  11/11/2022 41 >39 mg/dL Final   Triglycerides  Date Value Ref Range Status  11/11/2022 210 (H) 0 - 149 mg/dL Final         Failed - CBC within normal limits and completed in the last 12 months    WBC  Date Value Ref Range Status  03/31/2023 7.3 4.0 - 10.5 K/uL  Final   RBC  Date Value Ref Range Status  03/31/2023 4.04 3.87 - 5.11 MIL/uL Final   Hemoglobin  Date Value Ref Range Status  03/31/2023 12.3 12.0 - 15.0 g/dL Final  09/81/1914 78.2 11.1 - 15.9 g/dL Final   HCT  Date Value Ref Range Status  03/31/2023 39.0 36.0 - 46.0 % Final   Hematocrit  Date Value Ref Range Status  04/11/2021 43.2 34.0 - 46.6 % Final   MCHC  Date Value Ref Range Status  03/31/2023 31.5 30.0 - 36.0 g/dL Final   [facility]  Date Value Ref Range Status  03/31/2023 30.4 26.0 - 34.0 pg Final   MCV  Date Value Ref Range Status  03/31/2023 96.5 80.0 - 100.0 fL Final  04/11/2021 92 79 - 97 fL Final   No results found for: "PLTCOUNTKUC", "LABPLAT", "POCPLA" RDW  Date Value Ref Range Status  03/31/2023 14.9 11.5 -  15.5 % Final  04/11/2021 15.6 (H) 11.7 - 15.4 % Final         Failed - CMP within normal limits and completed in the last 12 months    Albumin  Date Value Ref Range Status  11/11/2022 4.7 3.9 - 4.9 g/dL Final   Alkaline Phosphatase  Date Value Ref Range Status  04/19/2021 164 (H) 44 - 121 IU/L Final   ALT  Date Value Ref Range Status  12/29/2020 60 (H) 0 - 44 U/L Final   AST  Date Value Ref Range Status  04/19/2021 42 (H) 0 - 40 IU/L Final   BUN  Date Value Ref Range Status  03/31/2023 17 8 - 23 mg/dL Final  16/07/9603 13 8 - 27 mg/dL Final   Calcium  Date Value Ref Range Status  03/31/2023 9.3 8.9 - 10.3 mg/dL Final   CO2  Date Value Ref Range Status  03/31/2023 25 22 - 32 mmol/L Final   Bicarbonate  Date Value Ref Range Status  05/18/2017 20.4 20.0 - 28.0 mmol/L Final   TCO2  Date Value Ref Range Status  05/17/2017 22 0 - 100 mmol/L Final   Creat  Date Value Ref Range Status  08/04/2016 0.88 0.50 - 1.05 mg/dL Final    Comment:      For patients > or = 64 years of age: The upper reference limit for Creatinine is approximately 13% higher for people identified as African-American.      Creatinine, Ser  Date Value  Ref Range Status  03/31/2023 0.89 0.44 - 1.00 mg/dL Final   Creatinine, Urine  Date Value Ref Range Status  07/02/2016 CANCELED 20 - 320 mg/dL     Comment:    Test not performed, no urine was received.    Result canceled by the ancillary    Glucose, Bld  Date Value Ref Range Status  04/13/2023 407 (H) 70 - 99 mg/dL Final    Comment:    Glucose reference range applies only to samples taken after fasting for at least 8 hours. Performed at [facility], [address] [place], [place] [address]    POC Glucose  Date Value Ref Range Status  01/13/2023 129 (A) 70 - 99 mg/dl Final   Glucose-Capillary  Date Value Ref Range Status  04/14/2023 83 70 - 99 mg/dL Final    Comment:    Glucose reference range applies only to samples taken after fasting for at least 8 hours.   Potassium  Date Value Ref Range Status  03/31/2023 3.7 3.5 - 5.1 mmol/L Final   Sodium  Date Value Ref Range Status  03/31/2023 141 135 - 145 mmol/L Final  11/11/2022 144 134 - 144 mmol/L Final   Bilirubin Total  Date Value Ref Range Status  04/19/2021 <0.2 0.0 - 1.2 mg/dL Final   Bilirubin, Direct  Date Value Ref Range Status  05/17/2017 <0.1 (L) 0.1 - 0.5 mg/dL Final   Indirect Bilirubin  Date Value Ref Range Status  05/17/2017 NOT CALCULATED 0.3 - 0.9 mg/dL Final   Protein, ur  Date Value Ref Range Status  12/30/2020 NEGATIVE NEGATIVE mg/dL Final   Total Protein  Date Value Ref Range Status  04/19/2021 7.3 6.0 - 8.5 g/dL Final   GFR calc Af Amer  Date Value Ref Range Status  11/12/2020 72 >59 mL/min/1.73 Final    Comment:    **In accordance with recommendations from the NKF-ASN Task force,**   [facility] is in the process of updating its  eGFR calculation to the   2021 CKD-EPI creatinine equation that estimates kidney function   without a race variable.    eGFR  Date Value Ref Range Status  11/11/2022 70 >59 mL/min/1.73 Final   GFR, Estimated  Date Value Ref Range Status   03/31/2023 >60 >60 mL/min Final    Comment:    (NOTE) Calculated using the CKD-EPI Creatinine Equation (2021)          Passed - Completed PHQ-2 or PHQ-9 in the last 360 days      Passed - Last Heart Rate in normal range    Pulse Readings from Last 1 Encounters:  04/14/23 (!) 103         Passed - Valid encounter within last 6 months    Recent Outpatient Visits           1 month ago Type 2 diabetes mellitus with hyperglycemia, with long-term current use of insulin (HCC)   [place] [facility] [place], [place] L, RPH-CPP   2 months ago Uncontrolled type 2 diabetes mellitus with hyperglycemia [facility])   [place] [facility] [place], [place] L, RPH-CPP   3 months ago Uncontrolled type 2 diabetes mellitus with hyperglycemia [facility])   [place] [facility] Storm Frisk, MD   5 months ago Type 2 diabetes mellitus with hyperglycemia, with long-term current use of insulin [facility])   [place] [facility] Storm Frisk, MD   6 months ago Mixed dyslipidemia   [facility] Center [place], RPH-CPP       Future Appointments             In 2 weeks [place] Charlcie Cradle, MD [facility]

## 2023-05-01 ENCOUNTER — Encounter: Payer: Self-pay | Admitting: Internal Medicine

## 2023-05-05 ENCOUNTER — Other Ambulatory Visit: Payer: Self-pay | Admitting: Critical Care Medicine

## 2023-05-05 ENCOUNTER — Other Ambulatory Visit: Payer: Self-pay

## 2023-05-05 DIAGNOSIS — E782 Mixed hyperlipidemia: Secondary | ICD-10-CM

## 2023-05-06 ENCOUNTER — Other Ambulatory Visit: Payer: Self-pay

## 2023-05-07 ENCOUNTER — Other Ambulatory Visit: Payer: Self-pay

## 2023-05-18 NOTE — Progress Notes (Unsigned)
Established Patient Office Visit  Subjective:  Patient ID: Ashley [name], female    DOB: 01/20/1961  Age: 62 y.o. MRN: [id number]  CC:  No chief complaint on file.   HPI 08/2022 Ashley [name] presents for primary care follow-up.  Patient with history of type 2 diabetes bipolar disorder hyperlipidemia  The patient is coming up on the first anniversary of the death of her daughter at age 42 of lupus.  She has been very depressed and disengaged around her health.  She had not been taking her insulin product or Glucotrol.  On arrival blood sugar was 435 and A1c was 10.6.  She does complain of numbness in the fingers and right hip pain.  She declines to receive any vaccinations at this time.  She does have a colon cancer screening she states she will consider following through on this.  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 The patient appears to recover from that surgery.  She states the numbness in the fingers were not occurring prior to the cervical surgery.  Patient does not have any other complaints at this time.  Her PHQ 9 and GAD-7 are elevated but she is not suicidal.  She still smokes a pack a day of cigarettes and is not engaged around quitting smoking at this time.  Also she is taking Seroquel on a regular basis but declines to see a provider for her mental health.  01/29/22 Patient returns in follow-up for hypertension on arrival blood pressure 120/80.  Blood sugar 194 A1c 9.8 down point from 10.6.  Not able to buy healthy food does not have Medicaid.  Patient needs to schedule her mammogram.  Has no other complaints at this visit.  She wants to give plasma. Continue glipizide  6/29 Patient seen in return follow-up since the last visit he did see Franky Macho in clinical pharmacy several weeks ago and  now has had Trulicity added to her program and is been switched to Basaglar 60 units daily and discontinuing glipizide daily.  Patient has no real complaints today except that she does not like where she is living she does not get on along well with the individual running her apartment complex.  She is trying to move into a different complex.  Patient still smoking a cigarette pack a day  Blood pressure on arrival was elevated 146/92 Patient's not on any blood pressure medicine she had been normal tensive previously but has slipped gradually back into the hypertensive state owing to the fact that she continues to smoke and is under a great deal of stress.  Patient has no other complaints.  Below is clinical pharmacy visit Saw Franky Macho 03/17/22: Diabetes longstanding currently uncontrolled given last A1c 9.8. Patient is able to verbalize appropriate hypoglycemia management plan. Medication adherence appears to be improving. She seems to be more engaged in improving her health. Due to high cost of Levemir, will switch BID Levemir to daily Basaglar which is free at our pharmacy. Will also start GLP-1 RA with CV and renal benefit given history of stroke and CKD and to better improve glycemic control. Will hopefully be able to decrease glipizide in the future as we increase Trulicity as tolerated to reduce hypoglycemic risk.  -Start Trulicity 0.75 mg weekly.  -Switch Levemir to Basaglar 60 units daily.  -Continue glipizide  XL 20 mg daily.  -Patient educated on purpose, proper use, and potential adverse effects of Trulicity.  -Extensively discussed pathophysiology of diabetes, recommended lifestyle interventions, dietary effects on blood sugar control.  -Counseled on s/sx of and management of hypoglycemia.  -Next A1c anticipated July 2023.    8/30 Patient seen in return follow-up she is still smoking about 10 cigarettes daily on arrival blood pressure is elevated 158/103 and this does not change on a  recheck.  Unfortunately she did not pick up and start the amlodipine from the last visit in June.  She states insurance co-pays are challenged for her.  She still complaining of low back pain had previous lumbar surgery would like another x-ray.  Blood sugar on arrival is 154 and A1c is down to 8.1.  She has been compliant with the Trulicity Basaglar and glipizide.  10/4 Patient returns in follow-up and she never filled the amlodipine she went home and changed her diet remove salt from the diet and on arrival today blood pressure is 125/85.  Patient continues to have low back pain and has been previously seen by neurosurgery who performed lumbar spine and neck fusion.  Recent chest x-ray and lumbar spine films showed progression of lumbar disease.  Note patient is now insured with [employer].  She has no other complaints except for her back pain.  11/11/22 Patient is seen in return follow-up last saw clinical pharmacist in November at that time A1c was 8.1 unfortunately today it is gone up even higher to a level of 8.4.  Blood sugar on arrival today is 203.  Blood sugars at home run in the 280 range.  She eats a lot of concentrated sweets.  Also she was not able to get the Trulicity that was ordered previously because of very high co-pay.  Today she needs a foot exam needs to have a repeat colon exam for cancer screening she also needs low-dose lung cancer screening she still smoking a pack a day of cigarettes.  Another issue she is having recurrent right neck pain and right hand weakness and numbness and may have recurrence in her cervical disc disease.  She had emergent surgery in 2022 because of quadriplegia and myelopathy and had significant spinal disease in the C-spine.  She has an associated neurosurgeon who has followed her.  She will need a follow-up appointment.  Another issue remains quite depressed after her grand son passed away from a drug overdose last fall.  She has had other losses as well.  She  has declined to receive mental health follow-up.  She is on the Seroquel and has history of borderline schizophrenia and bipolar disorder.  Blood pressure on arrival today is 125/78. Saw luke 10/29/2022 Expand All Collapse All  S:    IVYROSE GIERKE is a 62 y.o. female who presents for diabetes evaluation, education, and management. PMH is significant for T2DM, bipolar disorder, HLD, HTN, PVD, stroke, CKD. Patient was referred and last seen by Primary Care Provider, Dr. Delford Field, on 07/09/2022. I saw her on 08/05/2022. Unfortunately, she did not follow-up as instructed.    Today, patient arrives in poor spirits and presents without any assistance. She is not feeling good today, specifically, she tells me she feels nauseous. Denies any abdominal pain, vomiting, blood stools. She tells me this is "normal" for her. Additionally, she tells me that a lot has been going on since our last visit. Her granddaughter passed in November. She's been without most of her medications since  then. She tells me "she just hasn't been treating her body right". Denies any current depressive symptoms. No suicidal intent or plan. No homicidal intent or plan. She tells me she is motivated to get back on the right track.  Diabetes longstanding currently uncontrolled given last A1c of 8.1. She does not wish to give bloodwork today as she does not feel good. Patient is able to verbalize appropriate hypoglycemia management plan. Medication adherence is suboptimal. .  -Restart Trulicity. We will see if her copay is not cost prohibitive.   -Restart Basaglar 60 units daily.  -Restart glipizide XL 20 mg daily.  -Restart Novolog 5u TID with meals.     A1c foot colon ?lung ca eye depression  Past Medical History:  Diagnosis Date   Anxiety    Arthritis    Bipolar 1 disorder (HCC)    Cervical myelopathy (HCC) 12/28/2020   Cervical spinal cord compression (HCC) 12/19/2020   Depression    Diabetes mellitus without complication (HCC)     Type II   GERD (gastroesophageal reflux disease)    Heart murmur    "nothing to worry about"   HNP (herniated nucleus pulposus) with myelopathy, cervical    HTN (hypertension)    not on medication   Pneumonia    Quadriplegia and quadriparesis (HCC)    Stroke (HCC) 11/2020   per patient did not have a stroke    Past Surgical History:  Procedure Laterality Date   ABDOMINAL HYSTERECTOMY     ANTERIOR CERVICAL DECOMP/DISCECTOMY FUSION N/A 12/25/2020   Procedure: CERVICAL THREE-FOUR ANTERIOR CERVICAL DECOMPRESSION/DISCECTOMY FUSION;  Surgeon: Jadene Pierini, MD;  Location: [facility] OR;  Service: Neurosurgery;  Laterality: N/A;  CERVICAL THREE-FOUR ANTERIOR CERVICAL DECOMPRESSION/DISCECTOMY FUSION    ANTERIOR CERVICAL DECOMP/DISCECTOMY FUSION N/A 04/13/2023   Procedure: C3-5 ANTERIOR CERVICAL DISCECTOMY AND FUSION (HEDRON);  Surgeon: Venetia Night, MD;  Location: [facility] ORS;  Service: Neurosurgery;  Laterality: N/A;   LUMBAR LAMINECTOMY/DECOMPRESSION MICRODISCECTOMY Left 04/07/2016   Procedure: Left Lumbar four-five Microdiskectomy;  Surgeon: Maeola Harman, MD;  Location: [facility];  Service: Neurosurgery;  Laterality: Left;    Family History  Problem Relation Age of Onset   Healthy Mother    Diabetes Mellitus II Father    Breast cancer Sister    Diabetes Brother    Heart disease Neg Hx    Stroke Neg Hx    Kidney disease Neg Hx     Social History   Socioeconomic History   Marital status: Divorced    Spouse name: Not on file   Number of children: 3   Years of education: Not on file   Highest education level: High school graduate  Occupational History   Not on file  Tobacco Use   Smoking status: Every Day    Current packs/day: 1.00    Average packs/day: 1 pack/day for 39.0 years (39.0 ttl pk-yrs)    Types: Cigarettes   Smokeless tobacco: Never  Vaping Use   Vaping status: Never Used  Substance and Sexual Activity   Alcohol use: No   Drug use: Yes    Types: Marijuana     Comment: Cocaine -- 04/06/16- last time, smokes marijuana   Sexual activity: Yes    Birth control/protection: Surgical  Other Topics Concern   Not on file  Social History Narrative   Lives with daughter   Right Handed   Drinks 1 cup of coffee per day      Social Determinants of Health   Financial Resource Strain:  Low Risk  (04/08/2023)   Overall Financial Resource Strain (CARDIA)    Difficulty of Paying Living Expenses: Not hard at all  Food Insecurity: Food Insecurity Present (04/13/2023)   Hunger Vital Sign    Worried About Running Out of Food in the Last Year: Often true    Ran Out of Food in the Last Year: Sometimes true  Transportation Needs: No Transportation Needs (04/13/2023)   PRAPARE - Administrator, Civil Service (Medical): No    Lack of Transportation (Non-Medical): No  Physical Activity: Inactive (04/08/2023)   Exercise Vital Sign    Days of Exercise per Week: 0 days    Minutes of Exercise per Session: 0 min  Stress: No Stress Concern Present (04/08/2023)   [employer] of Occupational Health - Occupational Stress Questionnaire    Feeling of Stress : Only a little  Social Connections: Moderately Isolated (04/08/2023)   Social Connection and Isolation Panel [NHANES]    Frequency of Communication with Friends and Family: More than three times a week    Frequency of Social Gatherings with Friends and Family: Three times a week    Attends Religious Services: More than 4 times per year    Active Member of Clubs or Organizations: No    Attends Banker Meetings: Never    Marital Status: Divorced  Catering manager Violence: Not At Risk (04/13/2023)   Humiliation, Afraid, Rape, and Kick questionnaire    Fear of Current or Ex-Partner: No    Emotionally Abused: No    Physically Abused: No    Sexually Abused: No    Outpatient Medications Prior to Visit  Medication Sig Dispense Refill   ascorbic acid (VITAMIN C) 500 MG tablet Take 1 tablet (500 mg  total) by mouth daily.     Blood Glucose Monitoring Suppl (TRUE METRIX METER) w/Device KIT Use to check blood sugar up to 3 times daily. 1 kit 0   celecoxib (CELEBREX) 200 MG capsule Take 1 capsule (200 mg total) by mouth every 12 (twelve) hours. 60 capsule 0   Dulaglutide (TRULICITY) 0.75 MG/0.5ML SOPN Inject 0.75 mg into the skin once a week. (Patient not taking: Reported on 03/23/2023) 2 mL 1   glipiZIDE (GLUCOTROL XL) 10 MG 24 hr tablet Take 2 tablets (20 mg total) by mouth once daily. 60 tablet 4   glucose blood (TRUE METRIX BLOOD GLUCOSE TEST) test strip Use to check blood sugar up to 3 times daily. 100 each 2   Insulin Glargine (BASAGLAR KWIKPEN) 100 UNIT/ML Inject 70 Units into the skin daily. (Patient taking differently: Inject 50 Units into the skin daily. bedtime) 15 mL 2   Insulin Pen Needle (TECHLITE PEN NEEDLES) 32G X 4 MM MISC Use as directed. 100 each 3   methocarbamol (ROBAXIN) 500 MG tablet Take 1 tablet (500 mg total) by mouth every 8 (eight) hours as needed for muscle spasms. 90 tablet 0   oxyCODONE (ROXICODONE) 5 MG immediate release tablet Take 1 tablet (5 mg total) by mouth every 4 (four) hours as needed for severe pain. 30 tablet 0   QUEtiapine (SEROQUEL) 400 MG tablet Take 1 tablet (400 mg total) by mouth 2 (two) times daily. 60 tablet 0   rosuvastatin (CRESTOR) 40 MG tablet Take 1 tablet (40 mg total) by mouth once daily. 90 tablet 1   TRUEplus Lancets 28G MISC Use to check blood sugar up to 3 times daily. 100 each 3   No facility-administered medications prior to visit.  No Known Allergies  ROS Review of Systems  Constitutional:  Negative for chills, diaphoresis and fever.  HENT:  Negative for congestion, hearing loss, nosebleeds, sore throat and tinnitus.   Eyes:  Negative for photophobia and redness.  Respiratory:  Negative for cough, shortness of breath, wheezing and stridor.   Cardiovascular:  Negative for chest pain, palpitations and leg swelling.   Gastrointestinal:  Negative for abdominal pain, blood in stool, constipation, diarrhea, nausea and vomiting.  Endocrine: Negative for polydipsia.  Genitourinary:  Negative for dysuria, flank pain, frequency, hematuria and urgency.  Musculoskeletal:  Positive for neck pain. Negative for back pain and myalgias.  Skin:  Negative for rash.  Allergic/Immunologic: Negative for environmental allergies.  Neurological:  Positive for numbness. Negative for dizziness, tremors, seizures, weakness and headaches.  Hematological:  Does not bruise/bleed easily.  Psychiatric/Behavioral:  Positive for dysphoric mood. Negative for suicidal ideas. The patient is nervous/anxious.       Objective:    Physical Exam Vitals reviewed.  Constitutional:      Appearance: Normal appearance. She is well-developed and normal weight. She is not diaphoretic.  HENT:     Head: Normocephalic and atraumatic.     Nose: No nasal deformity, septal deviation, mucosal edema or rhinorrhea.     Right Sinus: No maxillary sinus tenderness or frontal sinus tenderness.     Left Sinus: No maxillary sinus tenderness or frontal sinus tenderness.     Mouth/Throat:     Pharynx: No oropharyngeal exudate.  Eyes:     General: No scleral icterus.    Conjunctiva/sclera: Conjunctivae normal.     Pupils: Pupils are equal, round, and reactive to light.  Neck:     Thyroid: No thyromegaly.     Vascular: No carotid bruit or JVD.     Trachea: Trachea normal. No tracheal tenderness or tracheal deviation.  Cardiovascular:     Rate and Rhythm: Normal rate and regular rhythm.     Chest Wall: PMI is not displaced.     Pulses: Normal pulses. No decreased pulses.     Heart sounds: Normal heart sounds, S1 normal and S2 normal. Heart sounds not distant. No murmur heard.    No systolic murmur is present.     No diastolic murmur is present.     No friction rub. No gallop. No S3 or S4 sounds.  Pulmonary:     Effort: No tachypnea, accessory muscle  usage or respiratory distress.     Breath sounds: No stridor. Wheezing present. No decreased breath sounds, rhonchi or rales.  Chest:     Chest wall: No tenderness.  Abdominal:     General: Bowel sounds are normal. There is no distension.     Palpations: Abdomen is soft. Abdomen is not rigid.     Tenderness: There is no abdominal tenderness. There is no guarding or rebound.  Musculoskeletal:        General: Tenderness present. Normal range of motion.     Cervical back: Normal range of motion and neck supple. No edema, erythema or rigidity. No muscular tenderness. Normal range of motion.     Comments: Right lateral neck area Foot exam normal  Lymphadenopathy:     Head:     Right side of head: No submental or submandibular adenopathy.     Left side of head: No submental or submandibular adenopathy.     Cervical: No cervical adenopathy.  Skin:    General: Skin is warm and dry.     Coloration: Skin  is not pale.     Findings: No rash.     Nails: There is no clubbing.  Neurological:     Mental Status: She is alert and oriented to person, place, and time.     Sensory: Sensory deficit present.     Motor: Weakness present.  Psychiatric:        Attention and Perception: Attention normal.        Mood and Affect: Mood is depressed.        Speech: Speech normal.        Behavior: Behavior normal. Behavior is cooperative.        Thought Content: Thought content does not include homicidal or suicidal ideation.        Cognition and Memory: Cognition and memory normal.        Judgment: Judgment normal.     There were no vitals taken for this visit. Wt Readings from Last 3 Encounters:  04/13/23 145 lb 15.1 oz (66.2 kg)  04/08/23 145 lb (65.8 kg)  03/31/23 145 lb 15.1 oz (66.2 kg)     Health Maintenance Due  Topic Date Due   DTaP/Tdap/Td (1 - Tdap) Never done   OPHTHALMOLOGY EXAM  11/30/2020   COLON CANCER SCREENING ANNUAL FOBT  09/06/2022   INFLUENZA VACCINE  05/07/2023   Diabetic  kidney evaluation - Urine ACR  06/05/2023    There are no preventive care reminders to display for this patient.  Lab Results  Component Value Date   TSH 0.970 11/12/2020   Lab Results  Component Value Date   WBC 7.3 03/31/2023   HGB 12.3 03/31/2023   HCT 39.0 03/31/2023   MCV 96.5 03/31/2023   PLT 313 03/31/2023   Lab Results  Component Value Date   NA 141 03/31/2023   K 3.7 03/31/2023   CO2 25 03/31/2023   GLUCOSE 407 (H) 04/13/2023   BUN 17 03/31/2023   CREATININE 0.89 03/31/2023   BILITOT <0.2 04/19/2021   ALKPHOS 164 (H) 04/19/2021   AST 42 (H) 04/19/2021   ALT 60 (H) 12/29/2020   PROT 7.3 04/19/2021   ALBUMIN 4.7 11/11/2022   CALCIUM 9.3 03/31/2023   ANIONGAP 8 03/31/2023   EGFR 70 11/11/2022   Lab Results  Component Value Date   CHOL 153 11/11/2022   Lab Results  Component Value Date   HDL 41 11/11/2022   Lab Results  Component Value Date   LDLCALC 77 11/11/2022   Lab Results  Component Value Date   TRIG 210 (H) 11/11/2022   Lab Results  Component Value Date   CHOLHDL 3.7 11/11/2022   Lab Results  Component Value Date   HGBA1C 6.2 03/23/2023      Assessment & Plan:   Problem List Items Addressed This Visit   None   No orders of the defined types were placed in this encounter.  35 minutes spent extra time needed for patient education and sorting out Trulicity access Follow-up: No follow-ups on file.    Shan Levans, MD

## 2023-05-19 ENCOUNTER — Ambulatory Visit: Payer: Medicare Other | Attending: Critical Care Medicine | Admitting: Critical Care Medicine

## 2023-05-19 ENCOUNTER — Other Ambulatory Visit: Payer: Self-pay

## 2023-05-19 ENCOUNTER — Other Ambulatory Visit: Payer: Self-pay | Admitting: Pharmacist

## 2023-05-19 ENCOUNTER — Encounter: Payer: Self-pay | Admitting: Critical Care Medicine

## 2023-05-19 VITALS — BP 138/85 | HR 91 | Wt 143.0 lb

## 2023-05-19 DIAGNOSIS — F319 Bipolar disorder, unspecified: Secondary | ICD-10-CM | POA: Diagnosis not present

## 2023-05-19 DIAGNOSIS — E782 Mixed hyperlipidemia: Secondary | ICD-10-CM | POA: Insufficient documentation

## 2023-05-19 DIAGNOSIS — Z91128 Patient's intentional underdosing of medication regimen for other reason: Secondary | ICD-10-CM | POA: Diagnosis not present

## 2023-05-19 DIAGNOSIS — Z5941 Food insecurity: Secondary | ICD-10-CM | POA: Insufficient documentation

## 2023-05-19 DIAGNOSIS — E119 Type 2 diabetes mellitus without complications: Secondary | ICD-10-CM

## 2023-05-19 DIAGNOSIS — T383X6A Underdosing of insulin and oral hypoglycemic [antidiabetic] drugs, initial encounter: Secondary | ICD-10-CM | POA: Insufficient documentation

## 2023-05-19 DIAGNOSIS — Z794 Long term (current) use of insulin: Secondary | ICD-10-CM

## 2023-05-19 DIAGNOSIS — F17210 Nicotine dependence, cigarettes, uncomplicated: Secondary | ICD-10-CM | POA: Diagnosis not present

## 2023-05-19 DIAGNOSIS — Z760 Encounter for issue of repeat prescription: Secondary | ICD-10-CM | POA: Diagnosis present

## 2023-05-19 DIAGNOSIS — Z7984 Long term (current) use of oral hypoglycemic drugs: Secondary | ICD-10-CM | POA: Diagnosis not present

## 2023-05-19 DIAGNOSIS — I10 Essential (primary) hypertension: Secondary | ICD-10-CM | POA: Insufficient documentation

## 2023-05-19 DIAGNOSIS — E1165 Type 2 diabetes mellitus with hyperglycemia: Secondary | ICD-10-CM | POA: Diagnosis not present

## 2023-05-19 LAB — GLUCOSE, POCT (MANUAL RESULT ENTRY): POC Glucose: 193 mg/dl — AB (ref 70–99)

## 2023-05-19 MED ORDER — GLIPIZIDE ER 10 MG PO TB24
20.0000 mg | ORAL_TABLET | Freq: Every day | ORAL | 4 refills | Status: DC
Start: 2023-05-19 — End: 2023-09-28
  Filled 2023-05-19 – 2023-06-16 (×2): qty 60, 30d supply, fill #0
  Filled 2023-09-07: qty 60, 30d supply, fill #1

## 2023-05-19 MED ORDER — QUETIAPINE FUMARATE 400 MG PO TABS
400.0000 mg | ORAL_TABLET | Freq: Two times a day (BID) | ORAL | 0 refills | Status: DC
  Filled 2023-05-19 – 2023-05-22 (×2): qty 60, 30d supply, fill #0

## 2023-05-19 MED ORDER — TRUE METRIX BLOOD GLUCOSE TEST VI STRP
ORAL_STRIP | 2 refills | Status: AC
Start: 2023-05-19 — End: ?
  Filled 2023-05-19: qty 100, 33d supply, fill #0

## 2023-05-19 MED ORDER — BASAGLAR KWIKPEN 100 UNIT/ML ~~LOC~~ SOPN
50.0000 [IU] | PEN_INJECTOR | Freq: Every day | SUBCUTANEOUS | 3 refills | Status: DC
  Filled 2023-05-19: qty 9, 18d supply, fill #0

## 2023-05-19 MED ORDER — NOVOLOG FLEXPEN 100 UNIT/ML ~~LOC~~ SOPN
5.0000 [IU] | PEN_INJECTOR | Freq: Three times a day (TID) | SUBCUTANEOUS | 2 refills | Status: DC
  Filled 2023-05-19: qty 9, fill #0

## 2023-05-19 MED ORDER — TRUEPLUS LANCETS 28G MISC
3 refills | Status: AC
Start: 2023-05-19 — End: ?
  Filled 2023-05-19: qty 100, 33d supply, fill #0

## 2023-05-19 MED ORDER — TECHLITE PEN NEEDLES 32G X 4 MM MISC
3 refills | Status: DC
Start: 2023-05-19 — End: 2023-09-28
  Filled 2023-05-19: qty 100, 25d supply, fill #0

## 2023-05-19 MED ORDER — INSULIN LISPRO (1 UNIT DIAL) 100 UNIT/ML (KWIKPEN)
5.0000 [IU] | PEN_INJECTOR | Freq: Three times a day (TID) | SUBCUTANEOUS | 2 refills | Status: DC
  Filled 2023-05-19: qty 6, 40d supply, fill #0

## 2023-05-19 MED ORDER — ROSUVASTATIN CALCIUM 40 MG PO TABS
40.0000 mg | ORAL_TABLET | Freq: Every day | ORAL | 1 refills | Status: DC
Start: 2023-05-19 — End: 2023-09-28
  Filled 2023-05-19: qty 90, 90d supply, fill #0

## 2023-05-19 NOTE — Progress Notes (Signed)
Established Patient Office Visit  Subjective:  Patient ID: Ashley [name], female    DOB: September 09, 1961  Age: 62 y.o. MRN: [id number]  CC:  Chief Complaint  Patient presents with   Hypertension   Medication Refill    HPI 08/2022 Ashley [name] presents for primary care follow-up.  Patient with history of type 2 diabetes bipolar disorder hyperlipidemia  The patient is coming up on the first anniversary of the death of her daughter at age 3 of lupus.  She has been very depressed and disengaged around her health.  She had not been taking her insulin product or Glucotrol.  On arrival blood sugar was 435 and A1c was 10.6.  She does complain of numbness in the fingers and right hip pain.  She declines to receive any vaccinations at this time.  She does have a colon cancer screening she states she will consider following through on this.  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 The patient appears to recover from that surgery.  She states the numbness in the fingers were not occurring prior to the cervical surgery.  Patient does not have any other complaints at this time.  Her PHQ 9 and GAD-7 are elevated but she is not suicidal.  She still smokes a pack a day of cigarettes and is not engaged around quitting smoking at this time.  Also she is taking Seroquel on a regular basis but declines to see a provider for her mental health.  01/29/22 Patient returns in follow-up for hypertension on arrival blood pressure 120/80.  Blood sugar 194 A1c 9.8 down point from 10.6.  Not able to buy healthy food does not have Medicaid.  Patient needs to schedule her mammogram.  Has no other complaints at this visit.  She wants to give plasma. Continue glipizide  6/29 Patient seen in return follow-up since the last visit he did see  Ashley [name] in clinical pharmacy several weeks ago and now has had Trulicity added to her program and is been switched to Basaglar 60 units daily and discontinuing glipizide daily.  Patient has no real complaints today except that she does not like where she is living she does not get on along well with the individual running her apartment complex.  She is trying to move into a different complex.  Patient still smoking a cigarette pack a day  Blood pressure on arrival was elevated 146/92 Patient's not on any blood pressure medicine she had been normal tensive previously but has slipped gradually back into the hypertensive state owing to the fact that she continues to smoke and is under a great deal of stress.  Patient has no other complaints.  Below is clinical pharmacy visit Saw Ashley [name] 03/17/22: Diabetes longstanding currently uncontrolled given last A1c 9.8. Patient is able to verbalize appropriate hypoglycemia management plan. Medication adherence appears to be improving. She seems to be more engaged in improving her health. Due to high cost of Levemir, will switch BID Levemir to daily Basaglar which is free at our pharmacy. Will also start GLP-1 RA with CV and renal benefit given history of stroke and CKD and to better improve glycemic control. Will hopefully be able to decrease glipizide in the future as we increase Trulicity as tolerated to reduce hypoglycemic risk.  -Start Trulicity 0.75 mg weekly.  -Switch  Levemir to Basaglar 60 units daily.  -Continue glipizide XL 20 mg daily.  -Patient educated on purpose, proper use, and potential adverse effects of Trulicity.  -Extensively discussed pathophysiology of diabetes, recommended lifestyle interventions, dietary effects on blood sugar control.  -Counseled on s/sx of and management of hypoglycemia.  -Next A1c anticipated July 2023.    8/30 Patient seen in return follow-up she is still smoking about 10 cigarettes daily on arrival blood pressure is  elevated 158/103 and this does not change on a recheck.  Unfortunately she did not pick up and start the amlodipine from the last visit in June.  She states insurance co-pays are challenged for her.  She still complaining of low back pain had previous lumbar surgery would like another x-ray.  Blood sugar on arrival is 154 and A1c is down to 8.1.  She has been compliant with the Trulicity Basaglar and glipizide.  10/4 Patient returns in follow-up and she never filled the amlodipine she went home and changed her diet remove salt from the diet and on arrival today blood pressure is 125/85.  Patient continues to have low back pain and has been previously seen by neurosurgery who performed lumbar spine and neck fusion.  Recent chest x-ray and lumbar spine films showed progression of lumbar disease.  Note patient is now insured with [employer].  She has no other complaints except for her back pain.  11/11/22 Patient is seen in return follow-up last saw clinical pharmacist in November at that time A1c was 8.1 unfortunately today it is gone up even higher to a level of 8.4.  Blood sugar on arrival today is 203.  Blood sugars at home run in the 280 range.  She eats a lot of concentrated sweets.  Also she was not able to get the Trulicity that was ordered previously because of very high co-pay.  Today she needs a foot exam needs to have a repeat colon exam for cancer screening she also needs low-dose lung cancer screening she still smoking a pack a day of cigarettes.  Another issue she is having recurrent right neck pain and right hand weakness and numbness and may have recurrence in her cervical disc disease.  She had emergent surgery in 2022 because of quadriplegia and myelopathy and had significant spinal disease in the C-spine.  She has an associated neurosurgeon who has followed her.  She will need a follow-up appointment.  Another issue remains quite depressed after her grand son passed away from a drug overdose last  fall.  She has had other losses as well.  She has declined to receive mental health follow-up.  She is on the Seroquel and has history of borderline schizophrenia and bipolar disorder.  Blood pressure on arrival today is 125/78. Saw luke 10/29/2022 Expand All Collapse All  S:    Ashley [name] is a 62 y.o. female who presents for diabetes evaluation, education, and management. PMH is significant for T2DM, bipolar disorder, HLD, HTN, PVD, stroke, CKD. Patient was referred and last seen by Primary Care Provider, Dr. Delford Field, on 07/09/2022. I saw her on 08/05/2022. Unfortunately, she did not follow-up as instructed.    Today, patient arrives in poor spirits and presents without any assistance. She is not feeling good today, specifically, she tells me she feels nauseous. Denies any abdominal pain, vomiting, blood stools. She tells me this is "normal" for her. Additionally, she tells me that a lot has been going on since our last visit. Her granddaughter passed in  November. She's been without most of her medications since then. She tells me "she just hasn't been treating her body right". Denies any current depressive symptoms. No suicidal intent or plan. No homicidal intent or plan. She tells me she is motivated to get back on the right track.  Diabetes longstanding currently uncontrolled given last A1c of 8.1. She does not wish to give bloodwork today as she does not feel good. Patient is able to verbalize appropriate hypoglycemia management plan. Medication adherence is suboptimal. .  -Restart Trulicity. We will see if her copay is not cost prohibitive.   -Restart Basaglar 60 units daily.  -Restart glipizide XL 20 mg daily.  -Restart Novolog 5u TID with meals.      01/13/23 Patient seen in return follow-up.  She had an MRI of her liver recently which showed hemangioma and benign.  On arrival blood pressure is 128/78.  Blood sugar is 240 but at home she is in the 110 range.  She is complaining of severe  neck pain and has an upcoming appointment with neurosurgery in [place].  Patient maintains tobacco use and trying to reduce usage.  She is on Trulicity weekly Basaglar 60 units daily glipizide XL 20 mg daily and NovoLog 5 units 3 times daily with meals per clinical pharmacy there are no other complaints.  8/13 This patient is seen in return follow-up  blood sugar 193 patient still smoking 1 pack a day of cigarettes.  She was not able to get Trulicity because of insurance restriction.  Blood sugars anywhere from 150 183 blood pressure today though is 135/85 on recheck The patient self reduced her long-acting insulin to 50 units a day she does not take the glipizide XL regularly and she has not been using NovoLog prior to food. The patient is recovering from anterior cervical spine surgery multilevel and has difficulty swallowing does have dentures but does not use them The patient has quite a few life stressors at this time Past Medical History:  Diagnosis Date   Anxiety    Arthritis    Bipolar 1 disorder (HCC)    Cervical myelopathy (HCC) 12/28/2020   Cervical spinal cord compression (HCC) 12/19/2020   Depression    Diabetes mellitus without complication (HCC)    Type II   GERD (gastroesophageal reflux disease)    Heart murmur    "nothing to worry about"   HNP (herniated nucleus pulposus) with myelopathy, cervical    HTN (hypertension)    not on medication   Pneumonia    Quadriplegia and quadriparesis (HCC)    Stroke (HCC) 11/2020   per patient did not have a stroke    Past Surgical History:  Procedure Laterality Date   ABDOMINAL HYSTERECTOMY     ANTERIOR CERVICAL DECOMP/DISCECTOMY FUSION N/A 12/25/2020   Procedure: CERVICAL THREE-FOUR ANTERIOR CERVICAL DECOMPRESSION/DISCECTOMY FUSION;  Surgeon: Jadene Pierini, MD;  Location: [facility] OR;  Service: Neurosurgery;  Laterality: N/A;  CERVICAL THREE-FOUR ANTERIOR CERVICAL DECOMPRESSION/DISCECTOMY FUSION    ANTERIOR CERVICAL  DECOMP/DISCECTOMY FUSION N/A 04/13/2023   Procedure: C3-5 ANTERIOR CERVICAL DISCECTOMY AND FUSION (HEDRON);  Surgeon: Venetia Night, MD;  Location: [facility] ORS;  Service: Neurosurgery;  Laterality: N/A;   LUMBAR LAMINECTOMY/DECOMPRESSION MICRODISCECTOMY Left 04/07/2016   Procedure: Left Lumbar four-five Microdiskectomy;  Surgeon: Maeola Harman, MD;  Location: [facility];  Service: Neurosurgery;  Laterality: Left;    Family History  Problem Relation Age of Onset   Healthy Mother    Diabetes Mellitus II Father    Breast  cancer Sister    Diabetes Brother    Heart disease Neg Hx    Stroke Neg Hx    Kidney disease Neg Hx     Social History   Socioeconomic History   Marital status: Divorced    Spouse name: Not on file   Number of children: 3   Years of education: Not on file   Highest education level: High school graduate  Occupational History   Not on file  Tobacco Use   Smoking status: Every Day    Current packs/day: 1.00    Average packs/day: 1 pack/day for 39.0 years (39.0 ttl pk-yrs)    Types: Cigarettes   Smokeless tobacco: Never  Vaping Use   Vaping status: Never Used  Substance and Sexual Activity   Alcohol use: No   Drug use: Yes    Types: Marijuana    Comment: Cocaine -- 04/06/16- last time, smokes marijuana   Sexual activity: Yes    Birth control/protection: Surgical  Other Topics Concern   Not on file  Social History Narrative   Lives with daughter   Right Handed   Drinks 1 cup of coffee per day      Social Determinants of Health   Financial Resource Strain: Low Risk  (04/08/2023)   Overall Financial Resource Strain (CARDIA)    Difficulty of Paying Living Expenses: Not hard at all  Food Insecurity: Food Insecurity Present (04/13/2023)   Hunger Vital Sign    Worried About Running Out of Food in the Last Year: Often true    Ran Out of Food in the Last Year: Sometimes true  Transportation Needs: No Transportation Needs (04/13/2023)   PRAPARE - Therapist, art (Medical): No    Lack of Transportation (Non-Medical): No  Physical Activity: Inactive (04/08/2023)   Exercise Vital Sign    Days of Exercise per Week: 0 days    Minutes of Exercise per Session: 0 min  Stress: No Stress Concern Present (04/08/2023)   [employer] of Occupational Health - Occupational Stress Questionnaire    Feeling of Stress : Only a little  Social Connections: Moderately Isolated (04/08/2023)   Social Connection and Isolation Panel [NHANES]    Frequency of Communication with Friends and Family: More than three times a week    Frequency of Social Gatherings with Friends and Family: Three times a week    Attends Religious Services: More than 4 times per year    Active Member of Clubs or Organizations: No    Attends Banker Meetings: Never    Marital Status: Divorced  Catering manager Violence: Not At Risk (04/13/2023)   Humiliation, Afraid, Rape, and Kick questionnaire    Fear of Current or Ex-Partner: No    Emotionally Abused: No    Physically Abused: No    Sexually Abused: No    Outpatient Medications Prior to Visit  Medication Sig Dispense Refill   ascorbic acid (VITAMIN C) 500 MG tablet Take 1 tablet (500 mg total) by mouth daily.     Blood Glucose Monitoring Suppl (TRUE METRIX METER) w/Device KIT Use to check blood sugar up to 3 times daily. 1 kit 0   celecoxib (CELEBREX) 200 MG capsule Take 1 capsule (200 mg total) by mouth every 12 (twelve) hours. 60 capsule 0   methocarbamol (ROBAXIN) 500 MG tablet Take 1 tablet (500 mg total) by mouth every 8 (eight) hours as needed for muscle spasms. 90 tablet 0   oxyCODONE (ROXICODONE) 5  MG immediate release tablet Take 1 tablet (5 mg total) by mouth every 4 (four) hours as needed for severe pain. 30 tablet 0   Dulaglutide (TRULICITY) 0.75 MG/0.5ML SOPN Inject 0.75 mg into the skin once a week. 2 mL 1   glipiZIDE (GLUCOTROL XL) 10 MG 24 hr tablet Take 2 tablets (20 mg total) by mouth once  daily. 60 tablet 4   glucose blood (TRUE METRIX BLOOD GLUCOSE TEST) test strip Use to check blood sugar up to 3 times daily. 100 each 2   Insulin Glargine (BASAGLAR KWIKPEN) 100 UNIT/ML Inject 70 Units into the skin daily. (Patient taking differently: Inject 50 Units into the skin daily. bedtime) 15 mL 2   Insulin Pen Needle (TECHLITE PEN NEEDLES) 32G X 4 MM MISC Use as directed. 100 each 3   QUEtiapine (SEROQUEL) 400 MG tablet Take 1 tablet (400 mg total) by mouth 2 (two) times daily. 60 tablet 0   rosuvastatin (CRESTOR) 40 MG tablet Take 1 tablet (40 mg total) by mouth once daily. 90 tablet 1   TRUEplus Lancets 28G MISC Use to check blood sugar up to 3 times daily. 100 each 3   No facility-administered medications prior to visit.    No Known Allergies  ROS Review of Systems  Constitutional:  Negative for chills, diaphoresis and fever.  HENT:  Positive for sore throat and trouble swallowing. Negative for congestion, hearing loss, nosebleeds and tinnitus.   Eyes:  Negative for photophobia and redness.  Respiratory:  Negative for cough, shortness of breath, wheezing and stridor.   Cardiovascular:  Negative for chest pain, palpitations and leg swelling.  Gastrointestinal:  Negative for abdominal pain, blood in stool, constipation, diarrhea, nausea and vomiting.  Endocrine: Negative for polydipsia.  Genitourinary:  Negative for dysuria, flank pain, frequency, hematuria and urgency.  Musculoskeletal:  Positive for neck pain. Negative for back pain and myalgias.  Skin:  Negative for rash.  Allergic/Immunologic: Negative for environmental allergies.  Neurological:  Positive for numbness. Negative for dizziness, tremors, seizures, weakness and headaches.  Hematological:  Does not bruise/bleed easily.  Psychiatric/Behavioral:  Positive for dysphoric mood. Negative for suicidal ideas. The patient is nervous/anxious.       Objective:    Physical Exam Vitals reviewed.  Constitutional:       Appearance: Normal appearance. She is well-developed and normal weight. She is not diaphoretic.  HENT:     Head: Normocephalic and atraumatic.     Nose: No nasal deformity, septal deviation, mucosal edema or rhinorrhea.     Right Sinus: No maxillary sinus tenderness or frontal sinus tenderness.     Left Sinus: No maxillary sinus tenderness or frontal sinus tenderness.     Mouth/Throat:     Pharynx: No oropharyngeal exudate.  Eyes:     General: No scleral icterus.    Conjunctiva/sclera: Conjunctivae normal.     Pupils: Pupils are equal, round, and reactive to light.  Neck:     Thyroid: No thyromegaly.     Vascular: No carotid bruit or JVD.     Trachea: Trachea normal. No tracheal tenderness or tracheal deviation.  Cardiovascular:     Rate and Rhythm: Normal rate and regular rhythm.     Chest Wall: PMI is not displaced.     Pulses: Normal pulses. No decreased pulses.     Heart sounds: Normal heart sounds, S1 normal and S2 normal. Heart sounds not distant. No murmur heard.    No systolic murmur is present.  No diastolic murmur is present.     No friction rub. No gallop. No S3 or S4 sounds.  Pulmonary:     Effort: No tachypnea, accessory muscle usage or respiratory distress.     Breath sounds: No stridor. No decreased breath sounds, wheezing, rhonchi or rales.  Chest:     Chest wall: No tenderness.  Abdominal:     General: Bowel sounds are normal. There is no distension.     Palpations: Abdomen is soft. Abdomen is not rigid.     Tenderness: There is no abdominal tenderness. There is no guarding or rebound.  Musculoskeletal:        General: Tenderness present. Normal range of motion.     Cervical back: Normal range of motion and neck supple. No edema, erythema or rigidity. No muscular tenderness. Normal range of motion.     Comments: Right lateral neck area   Lymphadenopathy:     Head:     Right side of head: No submental or submandibular adenopathy.     Left side of head: No  submental or submandibular adenopathy.     Cervical: No cervical adenopathy.  Skin:    General: Skin is warm and dry.     Coloration: Skin is not pale.     Findings: No rash.     Nails: There is no clubbing.  Neurological:     Mental Status: She is alert and oriented to person, place, and time.     Sensory: Sensory deficit present.     Motor: Weakness present.  Psychiatric:        Attention and Perception: Attention normal.        Mood and Affect: Mood is depressed.        Speech: Speech normal.        Behavior: Behavior normal. Behavior is cooperative.        Thought Content: Thought content does not include homicidal or suicidal ideation.        Cognition and Memory: Cognition and memory normal.        Judgment: Judgment normal.     BP 138/85   Pulse 91   Wt 143 lb (64.9 kg)   SpO2 100%   BMI 22.40 kg/m  Wt Readings from Last 3 Encounters:  05/19/23 143 lb (64.9 kg)  04/13/23 145 lb 15.1 oz (66.2 kg)  04/08/23 145 lb (65.8 kg)     Health Maintenance Due  Topic Date Due   DTaP/Tdap/Td (1 - Tdap) Never done   OPHTHALMOLOGY EXAM  11/30/2020   COLON CANCER SCREENING ANNUAL FOBT  09/06/2022   INFLUENZA VACCINE  05/07/2023   Diabetic kidney evaluation - Urine ACR  06/05/2023    There are no preventive care reminders to display for this patient.  Lab Results  Component Value Date   TSH 0.970 11/12/2020   Lab Results  Component Value Date   WBC 7.3 03/31/2023   HGB 12.3 03/31/2023   HCT 39.0 03/31/2023   MCV 96.5 03/31/2023   PLT 313 03/31/2023   Lab Results  Component Value Date   NA 141 03/31/2023   K 3.7 03/31/2023   CO2 25 03/31/2023   GLUCOSE 407 (H) 04/13/2023   BUN 17 03/31/2023   CREATININE 0.89 03/31/2023   BILITOT <0.2 04/19/2021   ALKPHOS 164 (H) 04/19/2021   AST 42 (H) 04/19/2021   ALT 60 (H) 12/29/2020   PROT 7.3 04/19/2021   ALBUMIN 4.7 11/11/2022   CALCIUM 9.3 03/31/2023   ANIONGAP 8  03/31/2023   EGFR 70 11/11/2022   Lab Results   Component Value Date   CHOL 153 11/11/2022   Lab Results  Component Value Date   HDL 41 11/11/2022   Lab Results  Component Value Date   LDLCALC 77 11/11/2022   Lab Results  Component Value Date   TRIG 210 (H) 11/11/2022   Lab Results  Component Value Date   CHOLHDL 3.7 11/11/2022   Lab Results  Component Value Date   HGBA1C 6.2 03/23/2023      Assessment & Plan:   Problem List Items Addressed This Visit       Cardiovascular and Mediastinum   Essential hypertension    Blood pressure controlled at this time no change in medication      Relevant Medications   rosuvastatin (CRESTOR) 40 MG tablet     Endocrine   Uncontrolled type 2 diabetes mellitus with hyperglycemia (HCC) - Primary    Advocated increase in insulin glargine the patient stated she would not do this at this time The patient agreed to resume glipizide  Return to clinical pharmacy      Relevant Medications   TRUEplus Lancets 28G MISC   glucose blood (TRUE METRIX BLOOD GLUCOSE TEST) test strip   Insulin Pen Needle (TECHLITE PEN NEEDLES) 32G X 4 MM MISC   glipiZIDE (GLUCOTROL XL) 10 MG 24 hr tablet   rosuvastatin (CRESTOR) 40 MG tablet   Insulin Glargine (BASAGLAR KWIKPEN) 100 UNIT/ML   Other Relevant Orders   Microalbumin / creatinine urine ratio   POCT glucose (manual entry) (Completed)     Other   Mixed dyslipidemia   Relevant Medications   rosuvastatin (CRESTOR) 40 MG tablet   Other Visit Diagnoses     Type 2 diabetes mellitus with hyperglycemia, with long-term current use of insulin (HCC)       Relevant Medications   TRUEplus Lancets 28G MISC   glucose blood (TRUE METRIX BLOOD GLUCOSE TEST) test strip   Insulin Pen Needle (TECHLITE PEN NEEDLES) 32G X 4 MM MISC   glipiZIDE (GLUCOTROL XL) 10 MG 24 hr tablet   rosuvastatin (CRESTOR) 40 MG tablet   Insulin Glargine (BASAGLAR KWIKPEN) 100 UNIT/ML       Meds ordered this encounter  Medications   TRUEplus Lancets 28G MISC    Sig:  Use to check blood sugar up to 3 times daily.    Dispense:  100 each    Refill:  3    Use to check blood sugar up to 3x daily.   glucose blood (TRUE METRIX BLOOD GLUCOSE TEST) test strip    Sig: Use to check blood sugar up to 3 times daily.    Dispense:  100 each    Refill:  2   Insulin Pen Needle (TECHLITE PEN NEEDLES) 32G X 4 MM MISC    Sig: Use as directed.    Dispense:  100 each    Refill:  3   glipiZIDE (GLUCOTROL XL) 10 MG 24 hr tablet    Sig: Take 2 tablets (20 mg total) by mouth once daily.    Dispense:  60 tablet    Refill:  4   QUEtiapine (SEROQUEL) 400 MG tablet    Sig: Take 1 tablet (400 mg total) by mouth 2 (two) times daily.    Dispense:  60 tablet    Refill:  0   rosuvastatin (CRESTOR) 40 MG tablet    Sig: Take 1 tablet (40 mg total) by mouth once daily.  Dispense:  90 tablet    Refill:  1   Insulin Glargine (BASAGLAR KWIKPEN) 100 UNIT/ML    Sig: Inject 50 Units into the skin daily. bedtime    Dispense:  9 mL    Refill:  3   DISCONTD: insulin aspart (NOVOLOG FLEXPEN) 100 UNIT/ML FlexPen    Sig: Inject 5 Units into the skin 3 (three) times daily before meals.    Dispense:  9 mL    Refill:  2   Follow-up: Return in about 3 months (around 08/19/2023) for htn, diabetes.    Shan Levans, MD HPI Review of Systems  Constitutional:  Negative for chills, diaphoresis and fever.  HENT:  Positive for sore throat and trouble swallowing. Negative for congestion, hearing loss, nosebleeds and tinnitus.   Eyes:  Negative for photophobia and redness.  Respiratory:  Negative for cough, shortness of breath, wheezing and stridor.   Cardiovascular:  Negative for chest pain, palpitations and leg swelling.  Gastrointestinal:  Negative for abdominal pain, blood in stool, constipation, diarrhea, nausea and vomiting.  Genitourinary:  Negative for dysuria, flank pain, frequency, hematuria and urgency.  Musculoskeletal:  Positive for neck pain. Negative for back pain and myalgias.   Skin:  Negative for rash.  Neurological:  Positive for numbness. Negative for dizziness, tremors, seizures, weakness and headaches.  Endo/Heme/Allergies:  Negative for environmental allergies and polydipsia. Does not bruise/bleed easily.  Psychiatric/Behavioral:  Positive for dysphoric mood. Negative for suicidal ideas. The patient is nervous/anxious.

## 2023-05-19 NOTE — Assessment & Plan Note (Signed)
Blood pressure controlled at this time no change in medication

## 2023-05-19 NOTE — Patient Instructions (Signed)
Medications refilled  Return 3 months

## 2023-05-19 NOTE — Assessment & Plan Note (Signed)
Advocated increase in insulin glargine the patient stated she would not do this at this time The patient agreed to resume glipizide  Return to clinical pharmacy

## 2023-05-20 ENCOUNTER — Other Ambulatory Visit: Payer: Self-pay

## 2023-05-20 DIAGNOSIS — M4802 Spinal stenosis, cervical region: Secondary | ICD-10-CM

## 2023-05-20 DIAGNOSIS — G959 Disease of spinal cord, unspecified: Secondary | ICD-10-CM

## 2023-05-20 DIAGNOSIS — M5412 Radiculopathy, cervical region: Secondary | ICD-10-CM

## 2023-05-21 ENCOUNTER — Telehealth: Payer: Self-pay

## 2023-05-21 ENCOUNTER — Other Ambulatory Visit: Payer: Self-pay

## 2023-05-21 NOTE — Telephone Encounter (Signed)
Called patient unable to make contact or leave voicemail, information sent to the nurse pool

## 2023-05-21 NOTE — Telephone Encounter (Signed)
-----   Message from Shan Levans sent at 05/21/2023  5:42 AM EDT ----- Let pt know urine study normal no kidney damage from diabetes

## 2023-05-21 NOTE — Progress Notes (Signed)
Let pt know urine study normal no kidney damage from diabetes

## 2023-05-22 ENCOUNTER — Other Ambulatory Visit: Payer: Self-pay

## 2023-05-26 ENCOUNTER — Encounter: Payer: Self-pay | Admitting: Neurosurgery

## 2023-05-26 ENCOUNTER — Ambulatory Visit (INDEPENDENT_AMBULATORY_CARE_PROVIDER_SITE_OTHER): Payer: Medicare Other | Admitting: Neurosurgery

## 2023-05-26 ENCOUNTER — Ambulatory Visit
Admission: RE | Admit: 2023-05-26 | Discharge: 2023-05-26 | Disposition: A | Discharge status: Home / Self Care | Payer: Medicare Other | Attending: Neurosurgery | Admitting: Neurosurgery

## 2023-05-26 ENCOUNTER — Ambulatory Visit
Admission: RE | Admit: 2023-05-26 | Discharge: 2023-05-26 | Disposition: A | Discharge status: Home / Self Care | Payer: Medicare Other | Source: Ambulatory Visit | Attending: Neurosurgery | Admitting: Neurosurgery

## 2023-05-26 VITALS — BP 120/80 | Temp 98.2°F | Ht 67.0 in | Wt 143.0 lb

## 2023-05-26 DIAGNOSIS — M5412 Radiculopathy, cervical region: Secondary | ICD-10-CM

## 2023-05-26 DIAGNOSIS — Z09 Encounter for follow-up examination after completed treatment for conditions other than malignant neoplasm: Secondary | ICD-10-CM

## 2023-05-26 DIAGNOSIS — M4316 Spondylolisthesis, lumbar region: Secondary | ICD-10-CM

## 2023-05-26 DIAGNOSIS — M4802 Spinal stenosis, cervical region: Secondary | ICD-10-CM | POA: Insufficient documentation

## 2023-05-26 DIAGNOSIS — T84498A Other mechanical complication of other internal orthopedic devices, implants and grafts, initial encounter: Secondary | ICD-10-CM

## 2023-05-26 DIAGNOSIS — G959 Disease of spinal cord, unspecified: Secondary | ICD-10-CM | POA: Diagnosis present

## 2023-05-26 DIAGNOSIS — M5136 Other intervertebral disc degeneration, lumbar region: Secondary | ICD-10-CM

## 2023-05-26 NOTE — Progress Notes (Signed)
   REFERRING PHYSICIAN:  No referring provider defined for this encounter.  DOS: 04/13/23 ACDF C3-C5 for pseudoarthrosis and stenosis   HISTORY OF PRESENT ILLNESS: Ashley [name] isstatus post ACDF C3-C5.   She is doing very well.  She feels that her strength is better.  PHYSICAL EXAMINATION:  NEUROLOGICAL:  General: In no acute distress.   Awake, alert, oriented to person, place, and time.  Pupils equal round and reactive to light.  Facial tone is symmetric.    Strength: Side Biceps Triceps Deltoid Interossei Grip Wrist Ext. Wrist Flex.  R 5 5 5 5  4+ 5 5  L 5 5 5 5  4+ 5 5   Incision c/d/i  Imaging:  In comparison to her intraoperative x-rays, she has had some collapse at the C4-5 level.  This is caused some posterior extrusion of the screws in the C5 vertebral body.   Assessment / Plan: Ashley [name] is doing well s/p above surgery.  She has some implant failure at C5.  I am concerned that this may worsen over time.  I would like to get a CT scan and we will see her back in clinic to discuss.  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  She has longstanding back issues and has had prior surgery.  I did let her know she would need to quit using any nicotine containing products to consider any low back surgery.  If her imaging findings worsen, she will need a more complex reconstruction of her neck that would involve C5 corpectomy to address the collapse of the C5 vertebral body.    Venetia Night MD Dept of Neurosurgery

## 2023-06-01 ENCOUNTER — Ambulatory Visit (HOSPITAL_COMMUNITY)
Admission: RE | Admit: 2023-06-01 | Discharge: 2023-06-01 | Disposition: A | Discharge status: Home / Self Care | Payer: Medicare Other | Source: Ambulatory Visit | Attending: Neurosurgery | Admitting: Neurosurgery

## 2023-06-01 DIAGNOSIS — G959 Disease of spinal cord, unspecified: Secondary | ICD-10-CM | POA: Diagnosis not present

## 2023-06-01 DIAGNOSIS — T84498A Other mechanical complication of other internal orthopedic devices, implants and grafts, initial encounter: Secondary | ICD-10-CM | POA: Diagnosis present

## 2023-06-16 ENCOUNTER — Other Ambulatory Visit: Payer: Self-pay

## 2023-06-16 ENCOUNTER — Other Ambulatory Visit: Payer: Self-pay | Admitting: Critical Care Medicine

## 2023-06-16 MED ORDER — QUETIAPINE FUMARATE 400 MG PO TABS
400.0000 mg | ORAL_TABLET | Freq: Two times a day (BID) | ORAL | 0 refills | Status: DC
  Filled 2023-06-16: qty 60, 30d supply, fill #0

## 2023-06-18 ENCOUNTER — Other Ambulatory Visit: Payer: Self-pay | Admitting: Neurosurgery

## 2023-06-18 ENCOUNTER — Other Ambulatory Visit: Payer: Self-pay

## 2023-06-19 ENCOUNTER — Other Ambulatory Visit: Payer: Self-pay

## 2023-06-19 MED ORDER — METHOCARBAMOL 500 MG PO TABS
500.0000 mg | ORAL_TABLET | Freq: Three times a day (TID) | ORAL | 0 refills | Status: DC | PRN
  Filled 2023-06-19: qty 90, 30d supply, fill #0

## 2023-06-22 ENCOUNTER — Other Ambulatory Visit: Payer: Self-pay

## 2023-06-30 ENCOUNTER — Encounter: Payer: Medicare Other | Admitting: Neurosurgery

## 2023-07-02 ENCOUNTER — Encounter: Payer: Medicare Other | Admitting: Neurosurgery

## 2023-07-02 ENCOUNTER — Encounter: Payer: Medicare Other | Admitting: Orthopedic Surgery

## 2023-07-14 ENCOUNTER — Other Ambulatory Visit: Payer: Self-pay

## 2023-07-14 ENCOUNTER — Other Ambulatory Visit: Payer: Self-pay | Admitting: Critical Care Medicine

## 2023-07-14 MED ORDER — QUETIAPINE FUMARATE 400 MG PO TABS
400.0000 mg | ORAL_TABLET | Freq: Two times a day (BID) | ORAL | 2 refills | Status: DC
  Filled 2023-07-14: qty 60, 30d supply, fill #0
  Filled 2023-08-11 (×2): qty 60, 30d supply, fill #1
  Filled 2023-09-07: qty 52, 26d supply, fill #2
  Filled 2023-09-07: qty 8, 4d supply, fill #2
  Filled 2023-09-07: qty 60, 30d supply, fill #2

## 2023-07-15 ENCOUNTER — Other Ambulatory Visit: Payer: Self-pay

## 2023-07-16 ENCOUNTER — Encounter: Payer: Medicare Other | Admitting: Neurosurgery

## 2023-07-16 ENCOUNTER — Other Ambulatory Visit: Payer: Self-pay

## 2023-08-11 ENCOUNTER — Other Ambulatory Visit: Payer: Self-pay

## 2023-08-11 ENCOUNTER — Other Ambulatory Visit (HOSPITAL_COMMUNITY): Payer: Self-pay

## 2023-09-07 ENCOUNTER — Other Ambulatory Visit: Payer: Self-pay

## 2023-09-09 ENCOUNTER — Other Ambulatory Visit: Payer: Self-pay

## 2023-09-21 ENCOUNTER — Ambulatory Visit: Payer: Medicare Other | Admitting: Nurse Practitioner

## 2023-09-28 ENCOUNTER — Ambulatory Visit: Payer: Medicare Other | Attending: Physician Assistant | Admitting: Physician Assistant

## 2023-09-28 ENCOUNTER — Other Ambulatory Visit: Payer: Self-pay

## 2023-09-28 ENCOUNTER — Encounter: Payer: Self-pay | Admitting: Physician Assistant

## 2023-09-28 VITALS — BP 105/72 | HR 83 | Wt 141.0 lb

## 2023-09-28 DIAGNOSIS — Z713 Dietary counseling and surveillance: Secondary | ICD-10-CM | POA: Diagnosis not present

## 2023-09-28 DIAGNOSIS — N289 Disorder of kidney and ureter, unspecified: Secondary | ICD-10-CM | POA: Insufficient documentation

## 2023-09-28 DIAGNOSIS — Z7984 Long term (current) use of oral hypoglycemic drugs: Secondary | ICD-10-CM | POA: Diagnosis not present

## 2023-09-28 DIAGNOSIS — M545 Low back pain, unspecified: Secondary | ICD-10-CM | POA: Insufficient documentation

## 2023-09-28 DIAGNOSIS — E1165 Type 2 diabetes mellitus with hyperglycemia: Secondary | ICD-10-CM | POA: Diagnosis present

## 2023-09-28 DIAGNOSIS — Z79899 Other long term (current) drug therapy: Secondary | ICD-10-CM | POA: Insufficient documentation

## 2023-09-28 DIAGNOSIS — F3289 Other specified depressive episodes: Secondary | ICD-10-CM | POA: Insufficient documentation

## 2023-09-28 DIAGNOSIS — G8929 Other chronic pain: Secondary | ICD-10-CM | POA: Diagnosis not present

## 2023-09-28 DIAGNOSIS — Z794 Long term (current) use of insulin: Secondary | ICD-10-CM | POA: Diagnosis not present

## 2023-09-28 DIAGNOSIS — E782 Mixed hyperlipidemia: Secondary | ICD-10-CM | POA: Diagnosis not present

## 2023-09-28 MED ORDER — QUETIAPINE FUMARATE 400 MG PO TABS
400.0000 mg | ORAL_TABLET | Freq: Two times a day (BID) | ORAL | 2 refills | Status: DC
Start: 2023-09-28 — End: 2023-12-21
  Filled 2023-09-28: qty 60, 30d supply, fill #0
  Filled 2023-10-29: qty 60, 30d supply, fill #1
  Filled 2023-11-23: qty 60, 30d supply, fill #2

## 2023-09-28 MED ORDER — GLIPIZIDE ER 10 MG PO TB24
10.0000 mg | ORAL_TABLET | Freq: Two times a day (BID) | ORAL | 4 refills | Status: DC
  Filled 2023-09-28: qty 60, 30d supply, fill #0
  Filled 2023-11-06: qty 60, 30d supply, fill #1
  Filled 2023-12-09 (×2): qty 60, 30d supply, fill #2
  Filled 2024-02-01: qty 60, 30d supply, fill #3
  Filled 2024-03-14 (×2): qty 60, 30d supply, fill #4

## 2023-09-28 MED ORDER — BASAGLAR KWIKPEN 100 UNIT/ML ~~LOC~~ SOPN
50.0000 [IU] | PEN_INJECTOR | Freq: Every day | SUBCUTANEOUS | 3 refills | Status: DC
Start: 2023-09-28 — End: 2024-01-27
  Filled 2023-09-28: qty 15, 30d supply, fill #0
  Filled 2023-10-29 (×2): qty 15, 30d supply, fill #1

## 2023-09-28 MED ORDER — TECHLITE PEN NEEDLES 32G X 4 MM MISC
3 refills | Status: AC
  Filled 2023-09-28: qty 100, 100d supply, fill #0

## 2023-09-28 MED ORDER — ROSUVASTATIN CALCIUM 40 MG PO TABS
40.0000 mg | ORAL_TABLET | Freq: Every day | ORAL | 1 refills | Status: AC
Start: 2023-09-28 — End: ?
  Filled 2023-09-28 – 2023-11-09 (×2): qty 90, 90d supply, fill #0

## 2023-09-28 MED ORDER — CELECOXIB 200 MG PO CAPS
200.0000 mg | ORAL_CAPSULE | Freq: Two times a day (BID) | ORAL | 2 refills | Status: DC
Start: 2023-09-28 — End: 2024-01-27
  Filled 2023-09-28: qty 60, 30d supply, fill #0
  Filled 2023-11-06: qty 60, 30d supply, fill #1

## 2023-09-28 NOTE — Progress Notes (Signed)
Patient ID: Ashley [name], female   DOB: Mar 14, 1961, 62 y.o.   MRN: [id number]   Ashley [name], is a 62 y.o. female  NGE:[id number]  [id number]:[id number]  DOB - 1961/02/19  Chief Complaint  Patient presents with   Medication Refill       Subjective:   Ashley [name] is a 62 y.o. female here today for med RF.  Dr Delford Field has been prescribing her seroquel bc she does not want to see a mental health provider.  She says she is doing well on it.  Denies SI/HI.  She is compliant with basal insulin at 50 units, glipizide.  Only takes humalog if sugar is too high.  No new issues or concerns.   No problems updated.  ALLERGIES: No Known Allergies  PAST MEDICAL HISTORY: Past Medical History:  Diagnosis Date   Anxiety    Arthritis    Bipolar 1 disorder (HCC)    Cervical myelopathy (HCC) 12/28/2020   Cervical spinal cord compression (HCC) 12/19/2020   Depression    Diabetes mellitus without complication (HCC)    Type II   GERD (gastroesophageal reflux disease)    Heart murmur    "nothing to worry about"   HNP (herniated nucleus pulposus) with myelopathy, cervical    HTN (hypertension)    not on medication   Pneumonia    Quadriplegia and quadriparesis (HCC)    Stroke (HCC) 11/2020   per patient did not have a stroke    MEDICATIONS AT HOME: Prior to Admission medications   Medication Sig Start Date End Date Taking? Authorizing Provider  ascorbic acid (VITAMIN C) 500 MG tablet Take 1 tablet (500 mg total) by mouth daily. 12/29/20  Yes Burnadette Pop, MD  Blood Glucose Monitoring Suppl (TRUE METRIX METER) w/Device KIT Use to check blood sugar up to 3 times daily. 03/17/22  Yes Storm Frisk, MD  glucose blood (TRUE METRIX BLOOD GLUCOSE TEST) test strip Use to check blood sugar up to 3 times daily. 05/19/23  Yes Storm Frisk, MD  insulin lispro (HUMALOG KWIKPEN) 100 UNIT/ML KwikPen Inject 5 Units into the skin 3 (three) times daily. 05/19/23  Yes Storm Frisk, MD  methocarbamol  (ROBAXIN) 500 MG tablet Take 1 tablet (500 mg total) by mouth every 8 (eight) hours as needed for muscle spasms. 06/19/23  Yes Venetia Night, MD  TRUEplus Lancets 28G MISC Use to check blood sugar up to 3 times daily. 05/19/23  Yes Storm Frisk, MD  celecoxib (CELEBREX) 200 MG capsule Take 1 capsule (200 mg total) by mouth every 12 (twelve) hours. 09/28/23   Anders Simmonds, PA-C  glipiZIDE (GLUCOTROL XL) 10 MG 24 hr tablet Take 1 tablet (10 mg total) by mouth 2 (two) times daily after a meal. 09/28/23   Duchess Armendarez, Marzella Schlein, PA-C  Insulin Glargine (BASAGLAR KWIKPEN) 100 UNIT/ML Inject 50 Units into the skin daily. bedtime 09/28/23   Georgian Co M, PA-C  Insulin Pen Needle (TECHLITE PEN NEEDLES) 32G X 4 MM MISC Use as directed. 09/28/23   Anders Simmonds, PA-C  QUEtiapine (SEROQUEL) 400 MG tablet Take 1 tablet (400 mg total) by mouth 2 (two) times daily. 09/28/23   Anders Simmonds, PA-C  rosuvastatin (CRESTOR) 40 MG tablet Take 1 tablet (40 mg total) by mouth once daily. 09/28/23   Ky Rumple, Marzella Schlein, PA-C    ROS: Neg HEENT Neg resp Neg cardiac Neg GI Neg GU Neg MS Neg psych Neg neuro  Objective:   Vitals:  09/28/23 1513 09/28/23 1525  BP: (!) 162/92 105/72  Pulse: 83   SpO2: 100%   Weight: 141 lb (64 kg)    Exam General appearance : Awake, alert, not in any distress. Speech Clear. Not toxic looking HEENT: Atraumatic and Normocephalic Neck: Supple, no JVD. No cervical lymphadenopathy.  Chest: Good air entry bilaterally, CTAB.  No rales/rhonchi/wheezing CVS: S1 S2 regular, no murmurs.  Extremities: B/L Lower Ext shows no edema, both legs are warm to touch Neurology: Awake alert, and oriented X 3, CN II-XII intact, Non focal Skin: No Rash  Data Review Lab Results  Component Value Date   HGBA1C 6.2 03/23/2023   HGBA1C 8.4 (A) 11/11/2022   HGBA1C 8.1 (A) 06/04/2022    Assessment & Plan   1. Type 2 diabetes mellitus with hyperglycemia, with long-term current  use of insulin (HCC) - glipiZIDE (GLUCOTROL XL) 10 MG 24 hr tablet; Take 1 tablet (10 mg total) by mouth 2 (two) times daily after a meal.  Dispense: 60 tablet; Refill: 4 - Insulin Glargine (BASAGLAR KWIKPEN) 100 UNIT/ML; Inject 50 Units into the skin daily. bedtime  Dispense: 9 mL; Refill: 3 - Insulin Pen Needle (TECHLITE PEN NEEDLES) 32G X 4 MM MISC; Use as directed.  Dispense: 100 each; Refill: 3 - Comprehensive metabolic panel - Hemoglobin A1c  2. Mixed dyslipidemia - rosuvastatin (CRESTOR) 40 MG tablet; Take 1 tablet (40 mg total) by mouth once daily.  Dispense: 90 tablet; Refill: 1  3. Renal insufficiency (Primary) - Comprehensive metabolic panel  4. Other depression Stable.  PCP she is assigned to will decide at next visit - QUEtiapine (SEROQUEL) 400 MG tablet; Take 1 tablet (400 mg total) by mouth 2 (two) times daily.  Dispense: 60 tablet; Refill: 2  5. Chronic midline low back pain without sciatica stable - celecoxib (CELEBREX) 200 MG capsule; Take 1 capsule (200 mg total) by mouth every 12 (twelve) hours.  Dispense: 60 capsule; Refill: 2    Return in about 4 months (around 01/27/2024) for PCP for chronic conditions(please assign her new PCP).  The patient was given clear instructions to go to ER or return to medical center if symptoms don't improve, worsen or new problems develop. The patient verbalized understanding. The patient was told to call to get lab results if they haven't heard anything in the next week.      Georgian Co, PA-C [facility] [place], [place] [phone]   09/28/2023, 3:46 PM

## 2023-09-29 ENCOUNTER — Telehealth: Payer: Self-pay

## 2023-09-29 LAB — COMPREHENSIVE METABOLIC PANEL
ALT: 15 [IU]/L (ref 0–32)
AST: 12 [IU]/L (ref 0–40)
Albumin: 4.4 g/dL (ref 3.9–4.9)
Alkaline Phosphatase: 134 [IU]/L — ABNORMAL HIGH (ref 44–121)
BUN/Creatinine Ratio: 18 (ref 12–28)
BUN: 15 mg/dL (ref 8–27)
Bilirubin Total: <0.2 mg/dL (ref 0.0–1.2)
CO2: 23 mmol/L (ref 20–29)
Calcium: 10 mg/dL (ref 8.7–10.3)
Chloride: 98 mmol/L (ref 96–106)
Creatinine, Ser: 0.84 mg/dL (ref 0.57–1.00)
Globulin, Total: 2.9 g/dL (ref 1.5–4.5)
Glucose: 254 mg/dL — ABNORMAL HIGH (ref 70–99)
Potassium: 4.5 mmol/L (ref 3.5–5.2)
Sodium: 138 mmol/L (ref 134–144)
Total Protein: 7.3 g/dL (ref 6.0–8.5)
eGFR: 78 mL/min/{1.73_m2} (ref >59)

## 2023-09-29 LAB — HEMOGLOBIN A1C
Est. average glucose Bld gHb Est-mCnc: 235 mg/dL
Hgb A1c MFr Bld: 9.8 % — ABNORMAL HIGH (ref 4.8–5.6)

## 2023-09-29 NOTE — Telephone Encounter (Signed)
Pt was called and vm was left, Information has been sent to nurse pool.

## 2023-09-29 NOTE — Telephone Encounter (Signed)
-----   Message from Georgian Co sent at 09/29/2023  8:42 AM EST ----- Your diabetes is uncontrolled and your A1C went from 6.2 to 9.8.  I want to increase your insulin to 55 units daily.  Take you glipizide and metformin.  Make sure not to skip any doses.  Work at a goal of eliminating sugary drinks, candy, desserts, sweets, refined sugars, processed foods, and white carbohydrates.  Kidneys, electrolytes and liver levels are normal.  Thanks, Georgian Co, PA-C

## 2023-10-01 ENCOUNTER — Other Ambulatory Visit: Payer: Self-pay

## 2023-10-15 ENCOUNTER — Ambulatory Visit: Payer: Medicare Other | Admitting: Physician Assistant

## 2023-10-29 ENCOUNTER — Other Ambulatory Visit: Payer: Self-pay

## 2023-10-30 ENCOUNTER — Other Ambulatory Visit: Payer: Self-pay

## 2023-11-06 ENCOUNTER — Other Ambulatory Visit: Payer: Self-pay

## 2023-11-09 ENCOUNTER — Other Ambulatory Visit: Payer: Self-pay

## 2023-11-23 ENCOUNTER — Other Ambulatory Visit: Payer: Self-pay

## 2023-12-09 ENCOUNTER — Other Ambulatory Visit: Payer: Self-pay

## 2023-12-21 ENCOUNTER — Other Ambulatory Visit: Payer: Self-pay | Admitting: Physician Assistant

## 2023-12-21 ENCOUNTER — Other Ambulatory Visit: Payer: Self-pay

## 2023-12-21 DIAGNOSIS — F3289 Other specified depressive episodes: Secondary | ICD-10-CM

## 2023-12-21 MED ORDER — QUETIAPINE FUMARATE 400 MG PO TABS
400.0000 mg | ORAL_TABLET | Freq: Two times a day (BID) | ORAL | 0 refills | Status: DC
Start: 2023-12-21 — End: 2024-01-18
  Filled 2023-12-21: qty 60, 30d supply, fill #0

## 2023-12-21 NOTE — Telephone Encounter (Signed)
 Requested medication (s) are due for refill today -yes  Requested medication (s) are on the active medication list -yes  Future visit scheduled -yes  Last refill: 09/28/23 #60 2RF  Notes to clinic: non delegated Rx  Requested Prescriptions  Pending Prescriptions Disp Refills   QUEtiapine (SEROQUEL) 400 MG tablet 60 tablet 2    Sig: Take 1 tablet (400 mg total) by mouth 2 (two) times daily.     Not Delegated - Psychiatry:  Antipsychotics - Second Generation (Atypical) - quetiapine Failed - 12/21/2023 11:02 AM      Failed - This refill cannot be delegated      Failed - TSH in normal range and within 360 days    TSH  Date Value Ref Range Status  11/12/2020 0.970 0.450 - 4.500 uIU/mL Final         Failed - Lipid Panel in normal range within the last 12 months    Cholesterol, Total  Date Value Ref Range Status  11/11/2022 153 100 - 199 mg/dL Final   LDL Chol Calc (NIH)  Date Value Ref Range Status  11/11/2022 77 0 - 99 mg/dL Final   HDL  Date Value Ref Range Status  11/11/2022 41 >39 mg/dL Final   Triglycerides  Date Value Ref Range Status  11/11/2022 210 (H) 0 - 149 mg/dL Final         Failed - CBC within normal limits and completed in the last 12 months    WBC  Date Value Ref Range Status  03/31/2023 7.3 4.0 - 10.5 K/uL Final   RBC  Date Value Ref Range Status  03/31/2023 4.04 3.87 - 5.11 MIL/uL Final   Hemoglobin  Date Value Ref Range Status  03/31/2023 12.3 12.0 - 15.0 g/dL Final  82/95/6213 08.6 11.1 - 15.9 g/dL Final   HCT  Date Value Ref Range Status  03/31/2023 39.0 36.0 - 46.0 % Final   Hematocrit  Date Value Ref Range Status  04/11/2021 43.2 34.0 - 46.6 % Final   MCHC  Date Value Ref Range Status  03/31/2023 31.5 30.0 - 36.0 g/dL Final   [facility]  Date Value Ref Range Status  03/31/2023 30.4 26.0 - 34.0 pg Final   MCV  Date Value Ref Range Status  03/31/2023 96.5 80.0 - 100.0 fL Final  04/11/2021 92 79 - 97 fL Final   No results found for:  "PLTCOUNTKUC", "LABPLAT", "POCPLA" RDW  Date Value Ref Range Status  03/31/2023 14.9 11.5 - 15.5 % Final  04/11/2021 15.6 (H) 11.7 - 15.4 % Final         Passed - Completed PHQ-2 or PHQ-9 in the last 360 days      Passed - Last BP in normal range    BP Readings from Last 1 Encounters:  09/28/23 105/72         Passed - Last Heart Rate in normal range    Pulse Readings from Last 1 Encounters:  09/28/23 83         Passed - Valid encounter within last 6 months    Recent Outpatient Visits           2 months ago Renal insufficiency   [facility] Dept Of [place]. [facility], Marylene Land M, [place]   7 months ago Uncontrolled type 2 diabetes mellitus with hyperglycemia [facility])   [facility] - A Dept Of . [facility] Storm Frisk, MD  9 months ago Type 2 diabetes mellitus with hyperglycemia, with long-term current use of insulin (HCC)   [facility] - A Dept Of [place]. [facility] Lois Huxley, [place] L, RPH-CPP   10 months ago Uncontrolled type 2 diabetes mellitus with hyperglycemia [facility])   [facility] - A Dept Of [place]. [facility] Lois Huxley, [place] L, RPH-CPP   11 months ago Uncontrolled type 2 diabetes mellitus with hyperglycemia [facility])   [facility] - A Dept Of [place]. [facility] Storm Frisk, MD       Future Appointments             In 1 month Hoy Register, MD [facility] - A Dept Of Eligha Bridegroom. [facility]            Passed - CMP within normal limits and completed in the last 12 months    Albumin  Date Value Ref Range Status  09/28/2023 4.4 3.9 - 4.9 g/dL Final   Alkaline Phosphatase  Date Value Ref Range Status  09/28/2023 134 (H) 44 - 121 IU/L Final   ALT  Date Value Ref Range Status  09/28/2023 15 0 - 32 IU/L Final   AST  Date Value Ref Range  Status  09/28/2023 12 0 - 40 IU/L Final   BUN  Date Value Ref Range Status  09/28/2023 15 8 - 27 mg/dL Final   Calcium  Date Value Ref Range Status  09/28/2023 10.0 8.7 - 10.3 mg/dL Final   CO2  Date Value Ref Range Status  09/28/2023 23 20 - 29 mmol/L Final   Bicarbonate  Date Value Ref Range Status  05/18/2017 20.4 20.0 - 28.0 mmol/L Final   TCO2  Date Value Ref Range Status  05/17/2017 22 0 - 100 mmol/L Final   Creat  Date Value Ref Range Status  08/04/2016 0.88 0.50 - 1.05 mg/dL Final    Comment:      For patients > or = 65 years of age: The upper reference limit for Creatinine is approximately 13% higher for people identified as African-American.      Creatinine, Ser  Date Value Ref Range Status  09/28/2023 0.84 0.57 - 1.00 mg/dL Final   Creatinine, Urine  Date Value Ref Range Status  07/02/2016 CANCELED 20 - 320 mg/dL     Comment:    Test not performed, no urine was received.    Result canceled by the ancillary    Glucose  Date Value Ref Range Status  09/28/2023 254 (H) 70 - 99 mg/dL Final   Glucose, Bld  Date Value Ref Range Status  04/13/2023 407 (H) 70 - 99 mg/dL Final    Comment:    Glucose reference range applies only to samples taken after fasting for at least 8 hours. Performed at [facility], [address] [place], [place] [address]    POC Glucose  Date Value Ref Range Status  05/19/2023 193 (A) 70 - 99 mg/dl Final   Glucose-Capillary  Date Value Ref Range Status  04/14/2023 83 70 - 99 mg/dL Final    Comment:    Glucose reference range applies only to samples taken after fasting for at least 8 hours.   Potassium  Date Value Ref Range Status  09/28/2023 4.5 3.5 - 5.2 mmol/L Final   Sodium  Date Value Ref Range Status  09/28/2023 138 134 - 144 mmol/L  Final   Bilirubin Total  Date Value Ref Range Status  09/28/2023 <0.2 0.0 - 1.2 mg/dL Final   Bilirubin, Direct  Date Value Ref Range Status  05/17/2017 <0.1 (L)  0.1 - 0.5 mg/dL Final   Indirect Bilirubin  Date Value Ref Range Status  05/17/2017 NOT CALCULATED 0.3 - 0.9 mg/dL Final   Protein, ur  Date Value Ref Range Status  12/30/2020 NEGATIVE NEGATIVE mg/dL Final   Total Protein  Date Value Ref Range Status  09/28/2023 7.3 6.0 - 8.5 g/dL Final   GFR calc Af Amer  Date Value Ref Range Status  11/12/2020 72 >59 mL/min/1.73 Final    Comment:    **In accordance with recommendations from the NKF-ASN Task force,**   [facility] is in the process of updating its eGFR calculation to the   2021 CKD-EPI creatinine equation that estimates kidney function   without a race variable.    eGFR  Date Value Ref Range Status  09/28/2023 78 >59 mL/min/1.73 Final   GFR, Estimated  Date Value Ref Range Status  03/31/2023 >60 >60 mL/min Final    Comment:    (NOTE) Calculated using the CKD-EPI Creatinine Equation (2021)             Requested Prescriptions  Pending Prescriptions Disp Refills   QUEtiapine (SEROQUEL) 400 MG tablet 60 tablet 2    Sig: Take 1 tablet (400 mg total) by mouth 2 (two) times daily.     Not Delegated - Psychiatry:  Antipsychotics - Second Generation (Atypical) - quetiapine Failed - 12/21/2023 11:02 AM      Failed - This refill cannot be delegated      Failed - TSH in normal range and within 360 days    TSH  Date Value Ref Range Status  11/12/2020 0.970 0.450 - 4.500 uIU/mL Final         Failed - Lipid Panel in normal range within the last 12 months    Cholesterol, Total  Date Value Ref Range Status  11/11/2022 153 100 - 199 mg/dL Final   LDL Chol Calc (NIH)  Date Value Ref Range Status  11/11/2022 77 0 - 99 mg/dL Final   HDL  Date Value Ref Range Status  11/11/2022 41 >39 mg/dL Final   Triglycerides  Date Value Ref Range Status  11/11/2022 210 (H) 0 - 149 mg/dL Final         Failed - CBC within normal limits and completed in the last 12 months    WBC  Date Value Ref Range Status  03/31/2023 7.3 4.0 -  10.5 K/uL Final   RBC  Date Value Ref Range Status  03/31/2023 4.04 3.87 - 5.11 MIL/uL Final   Hemoglobin  Date Value Ref Range Status  03/31/2023 12.3 12.0 - 15.0 g/dL Final  09/81/1914 78.2 11.1 - 15.9 g/dL Final   HCT  Date Value Ref Range Status  03/31/2023 39.0 36.0 - 46.0 % Final   Hematocrit  Date Value Ref Range Status  04/11/2021 43.2 34.0 - 46.6 % Final   MCHC  Date Value Ref Range Status  03/31/2023 31.5 30.0 - 36.0 g/dL Final   [facility]  Date Value Ref Range Status  03/31/2023 30.4 26.0 - 34.0 pg Final   MCV  Date Value Ref Range Status  03/31/2023 96.5 80.0 - 100.0 fL Final  04/11/2021 92 79 - 97 fL Final   No results found for: "PLTCOUNTKUC", "LABPLAT", "POCPLA" RDW  Date Value Ref Range Status  03/31/2023 14.9  11.5 - 15.5 % Final  04/11/2021 15.6 (H) 11.7 - 15.4 % Final         Passed - Completed PHQ-2 or PHQ-9 in the last 360 days      Passed - Last BP in normal range    BP Readings from Last 1 Encounters:  09/28/23 105/72         Passed - Last Heart Rate in normal range    Pulse Readings from Last 1 Encounters:  09/28/23 83         Passed - Valid encounter within last 6 months    Recent Outpatient Visits           2 months ago Renal insufficiency   [facility] Dept Of [place]. [facility] Marylene Land M, [place]   7 months ago Uncontrolled type 2 diabetes mellitus with hyperglycemia [facility])   [facility] - A Dept Of [place]. [facility] Storm Frisk, MD   9 months ago Type 2 diabetes mellitus with hyperglycemia, with long-term current use of insulin [facility])   [facility] - A Dept Of [place]. [facility] Lois Huxley, [place] L, RPH-CPP   10 months ago Uncontrolled type 2 diabetes mellitus with hyperglycemia [facility])   [facility] - A Dept Of [place]. [facility] Lois Huxley, [place] L, RPH-CPP   11 months ago  Uncontrolled type 2 diabetes mellitus with hyperglycemia [facility])   [facility] - A Dept Of . [facility] Storm Frisk, MD       Future Appointments             In 1 month Hoy Register, MD [facility] - A Dept Of Eligha Bridegroom. [facility]            Passed - CMP within normal limits and completed in the last 12 months    Albumin  Date Value Ref Range Status  09/28/2023 4.4 3.9 - 4.9 g/dL Final   Alkaline Phosphatase  Date Value Ref Range Status  09/28/2023 134 (H) 44 - 121 IU/L Final   ALT  Date Value Ref Range Status  09/28/2023 15 0 - 32 IU/L Final   AST  Date Value Ref Range Status  09/28/2023 12 0 - 40 IU/L Final   BUN  Date Value Ref Range Status  09/28/2023 15 8 - 27 mg/dL Final   Calcium  Date Value Ref Range Status  09/28/2023 10.0 8.7 - 10.3 mg/dL Final   CO2  Date Value Ref Range Status  09/28/2023 23 20 - 29 mmol/L Final   Bicarbonate  Date Value Ref Range Status  05/18/2017 20.4 20.0 - 28.0 mmol/L Final   TCO2  Date Value Ref Range Status  05/17/2017 22 0 - 100 mmol/L Final   Creat  Date Value Ref Range Status  08/04/2016 0.88 0.50 - 1.05 mg/dL Final    Comment:      For patients > or = 65 years of age: The upper reference limit for Creatinine is approximately 13% higher for people identified as African-American.      Creatinine, Ser  Date Value Ref Range Status  09/28/2023 0.84 0.57 - 1.00 mg/dL Final   Creatinine, Urine  Date Value Ref Range Status  07/02/2016 CANCELED 20 - 320 mg/dL     Comment:    Test not  performed, no urine was received.    Result canceled by the ancillary    Glucose  Date Value Ref Range Status  09/28/2023 254 (H) 70 - 99 mg/dL Final   Glucose, Bld  Date Value Ref Range Status  04/13/2023 407 (H) 70 - 99 mg/dL Final    Comment:    Glucose reference range applies only to samples taken after fasting for at least 8 hours. Performed  at [facility], [address] [place], [place] [address]    POC Glucose  Date Value Ref Range Status  05/19/2023 193 (A) 70 - 99 mg/dl Final   Glucose-Capillary  Date Value Ref Range Status  04/14/2023 83 70 - 99 mg/dL Final    Comment:    Glucose reference range applies only to samples taken after fasting for at least 8 hours.   Potassium  Date Value Ref Range Status  09/28/2023 4.5 3.5 - 5.2 mmol/L Final   Sodium  Date Value Ref Range Status  09/28/2023 138 134 - 144 mmol/L Final   Bilirubin Total  Date Value Ref Range Status  09/28/2023 <0.2 0.0 - 1.2 mg/dL Final   Bilirubin, Direct  Date Value Ref Range Status  05/17/2017 <0.1 (L) 0.1 - 0.5 mg/dL Final   Indirect Bilirubin  Date Value Ref Range Status  05/17/2017 NOT CALCULATED 0.3 - 0.9 mg/dL Final   Protein, ur  Date Value Ref Range Status  12/30/2020 NEGATIVE NEGATIVE mg/dL Final   Total Protein  Date Value Ref Range Status  09/28/2023 7.3 6.0 - 8.5 g/dL Final   GFR calc Af Amer  Date Value Ref Range Status  11/12/2020 72 >59 mL/min/1.73 Final    Comment:    **In accordance with recommendations from the NKF-ASN Task force,**   [facility] is in the process of updating its eGFR calculation to the   2021 CKD-EPI creatinine equation that estimates kidney function   without a race variable.    eGFR  Date Value Ref Range Status  09/28/2023 78 >59 mL/min/1.73 Final   GFR, Estimated  Date Value Ref Range Status  03/31/2023 >60 >60 mL/min Final    Comment:    (NOTE) Calculated using the CKD-EPI Creatinine Equation (2021)

## 2023-12-23 ENCOUNTER — Other Ambulatory Visit: Payer: Self-pay

## 2024-01-05 ENCOUNTER — Other Ambulatory Visit: Payer: Self-pay

## 2024-01-18 ENCOUNTER — Other Ambulatory Visit: Payer: Self-pay | Admitting: Family Medicine

## 2024-01-18 ENCOUNTER — Other Ambulatory Visit: Payer: Self-pay

## 2024-01-18 ENCOUNTER — Other Ambulatory Visit: Payer: Self-pay | Admitting: Critical Care Medicine

## 2024-01-18 DIAGNOSIS — F3289 Other specified depressive episodes: Secondary | ICD-10-CM

## 2024-01-19 ENCOUNTER — Other Ambulatory Visit: Payer: Self-pay

## 2024-01-19 ENCOUNTER — Other Ambulatory Visit: Payer: Self-pay | Admitting: Family Medicine

## 2024-01-19 DIAGNOSIS — F3132 Bipolar disorder, current episode depressed, moderate: Secondary | ICD-10-CM

## 2024-01-19 DIAGNOSIS — F3289 Other specified depressive episodes: Secondary | ICD-10-CM

## 2024-01-20 ENCOUNTER — Other Ambulatory Visit: Payer: Self-pay

## 2024-01-20 MED ORDER — QUETIAPINE FUMARATE 400 MG PO TABS
400.0000 mg | ORAL_TABLET | Freq: Two times a day (BID) | ORAL | 0 refills | Status: DC
Start: 2024-01-20 — End: 2024-02-24
  Filled 2024-01-20: qty 60, 30d supply, fill #0

## 2024-01-21 ENCOUNTER — Other Ambulatory Visit: Payer: Self-pay

## 2024-01-27 ENCOUNTER — Other Ambulatory Visit (HOSPITAL_COMMUNITY): Payer: Self-pay

## 2024-01-27 ENCOUNTER — Encounter: Payer: Self-pay | Admitting: Family Medicine

## 2024-01-27 ENCOUNTER — Ambulatory Visit: Payer: Medicare Other | Attending: Family Medicine | Admitting: Family Medicine

## 2024-01-27 ENCOUNTER — Other Ambulatory Visit: Payer: Self-pay

## 2024-01-27 VITALS — BP 164/89 | HR 86 | Ht 67.0 in | Wt 145.0 lb

## 2024-01-27 DIAGNOSIS — I10 Essential (primary) hypertension: Secondary | ICD-10-CM | POA: Insufficient documentation

## 2024-01-27 DIAGNOSIS — Z794 Long term (current) use of insulin: Secondary | ICD-10-CM | POA: Insufficient documentation

## 2024-01-27 DIAGNOSIS — E785 Hyperlipidemia, unspecified: Secondary | ICD-10-CM | POA: Insufficient documentation

## 2024-01-27 DIAGNOSIS — Z7984 Long term (current) use of oral hypoglycemic drugs: Secondary | ICD-10-CM | POA: Insufficient documentation

## 2024-01-27 DIAGNOSIS — Z79899 Other long term (current) drug therapy: Secondary | ICD-10-CM | POA: Insufficient documentation

## 2024-01-27 DIAGNOSIS — F319 Bipolar disorder, unspecified: Secondary | ICD-10-CM | POA: Insufficient documentation

## 2024-01-27 DIAGNOSIS — F17210 Nicotine dependence, cigarettes, uncomplicated: Secondary | ICD-10-CM | POA: Insufficient documentation

## 2024-01-27 DIAGNOSIS — E1165 Type 2 diabetes mellitus with hyperglycemia: Secondary | ICD-10-CM | POA: Insufficient documentation

## 2024-01-27 DIAGNOSIS — F3132 Bipolar disorder, current episode depressed, moderate: Secondary | ICD-10-CM

## 2024-01-27 LAB — POCT GLYCOSYLATED HEMOGLOBIN (HGB A1C): HbA1c, POC (controlled diabetic range): 12.5 % — AB (ref 0.0–7.0)

## 2024-01-27 MED ORDER — BASAGLAR KWIKPEN 100 UNIT/ML ~~LOC~~ SOPN
55.0000 [IU] | PEN_INJECTOR | Freq: Every day | SUBCUTANEOUS | 3 refills | Status: DC
Start: 2024-01-27 — End: 2024-03-24
  Filled 2024-01-27: qty 15, 27d supply, fill #0
  Filled 2024-01-27: qty 9, 16d supply, fill #0

## 2024-01-27 MED ORDER — INSULIN LISPRO (1 UNIT DIAL) 100 UNIT/ML (KWIKPEN)
10.0000 [IU] | PEN_INJECTOR | Freq: Three times a day (TID) | SUBCUTANEOUS | 2 refills | Status: DC
Start: 2024-01-27 — End: 2024-03-24
  Filled 2024-01-27: qty 9, 30d supply, fill #0
  Filled 2024-01-27: qty 30, 100d supply, fill #0

## 2024-01-27 NOTE — Progress Notes (Signed)
 Subjective:  Patient ID: Ashley [name], female    DOB: September 16, 1961  Age: 63 y.o. MRN: [id number]  CC: Medical Management of Chronic Issues     Discussed the use of AI scribe software for clinical note transcription with the patient, who gave verbal consent to proceed.  History of Present Illness The patient, with a history of bipolar disorder, diabetes, hyperlipidemia and hypertension, presents for a routine follow-up. The patient is currently on a high dose of Seroquel  (400mg  for bipolar disorder and is adamant about not changing this medication). The patient refuses to see a psychiatrist for management of this medication.  Regarding the patient's diabetes, she admits to poor dietary choices and inconsistent medication adherence. The patient reports eating unhealthy foods such as chocolate, barbecue sauce, and fatty meats. Despite taking diabetes medications, the patient's A1c has increased from 9.8 to 12.5.  The patient also has hypertension which is diet controlled and her initial blood pressure was elevated but normalized on repeat.  The patient is also on medication for cholesterol, but does not require refills at this time. The patient was previously on Celebrex  for arthritis and a muscle relaxant, but has stopped taking these medications as they were not effective.    Past Medical History:  Diagnosis Date   Anxiety    Arthritis    Bipolar 1 disorder (HCC)    Cervical myelopathy (HCC) 12/28/2020   Cervical spinal cord compression (HCC) 12/19/2020   Depression    Diabetes mellitus without complication (HCC)    Type II   GERD (gastroesophageal reflux disease)    Heart murmur    "nothing to worry about"   HNP (herniated nucleus pulposus) with myelopathy, cervical    HTN (hypertension)    not on medication   Pneumonia    Quadriplegia and quadriparesis (HCC)    Stroke (HCC) 11/2020   per patient did not have a stroke    Past Surgical History:  Procedure Laterality Date    ABDOMINAL HYSTERECTOMY     ANTERIOR CERVICAL DECOMP/DISCECTOMY FUSION N/A 12/25/2020   Procedure: CERVICAL THREE-FOUR ANTERIOR CERVICAL DECOMPRESSION/DISCECTOMY FUSION;  Surgeon: Cannon Champion, MD;  Location: [facility] OR;  Service: Neurosurgery;  Laterality: N/A;  CERVICAL THREE-FOUR ANTERIOR CERVICAL DECOMPRESSION/DISCECTOMY FUSION    ANTERIOR CERVICAL DECOMP/DISCECTOMY FUSION N/A 04/13/2023   Procedure: C3-5 ANTERIOR CERVICAL DISCECTOMY AND FUSION (HEDRON);  Surgeon: Jodeen Munch, MD;  Location: [facility] ORS;  Service: Neurosurgery;  Laterality: N/A;   LUMBAR LAMINECTOMY/DECOMPRESSION MICRODISCECTOMY Left 04/07/2016   Procedure: Left Lumbar four-five Microdiskectomy;  Surgeon: Manya Sells, MD;  Location: [facility];  Service: Neurosurgery;  Laterality: Left;    Family History  Problem Relation Age of Onset   Healthy Mother    Diabetes Mellitus II Father    Breast cancer Sister    Diabetes Brother    Heart disease Neg Hx    Stroke Neg Hx    Kidney disease Neg Hx     Social History   Socioeconomic History   Marital status: Divorced    Spouse name: Not on file   Number of children: 3   Years of education: Not on file   Highest education level: High school graduate  Occupational History   Not on file  Tobacco Use   Smoking status: Every Day    Current packs/day: 1.00    Average packs/day: 1 pack/day for 39.0 years (39.0 ttl pk-yrs)    Types: Cigarettes   Smokeless tobacco: Never  Vaping Use   Vaping status: Never  Used  Substance and Sexual Activity   Alcohol use: No   Drug use: Yes    Types: Marijuana    Comment: Cocaine -- 04/06/16- last time, smokes marijuana   Sexual activity: Yes    Birth control/protection: Surgical  Other Topics Concern   Not on file  Social History Narrative   Lives with daughter   Right Handed   Drinks 1 cup of coffee per day      Social Drivers of Health   Financial Resource Strain: Low Risk  (04/08/2023)   Overall Financial Resource  Strain (CARDIA)    Difficulty of Paying Living Expenses: Not hard at all  Food Insecurity: Food Insecurity Present (04/13/2023)   Hunger Vital Sign    Worried About Running Out of Food in the Last Year: Often true    Ran Out of Food in the Last Year: Sometimes true  Transportation Needs: No Transportation Needs (04/13/2023)   PRAPARE - Administrator, Civil Service (Medical): No    Lack of Transportation (Non-Medical): No  Physical Activity: Inactive (04/08/2023)   Exercise Vital Sign    Days of Exercise per Week: 0 days    Minutes of Exercise per Session: 0 min  Stress: No Stress Concern Present (04/08/2023)   [employer] of Occupational Health - Occupational Stress Questionnaire    Feeling of Stress : Only a little  Social Connections: Moderately Isolated (04/08/2023)   Social Connection and Isolation Panel [NHANES]    Frequency of Communication with Friends and Family: More than three times a week    Frequency of Social Gatherings with Friends and Family: Three times a week    Attends Religious Services: More than 4 times per year    Active Member of Clubs or Organizations: No    Attends Banker Meetings: Never    Marital Status: Divorced    No Known Allergies  Outpatient Medications Prior to Visit  Medication Sig Dispense Refill   ascorbic acid  (VITAMIN C) 500 MG tablet Take 1 tablet (500 mg total) by mouth daily.     Blood Glucose Monitoring Suppl (TRUE METRIX METER) w/Device KIT Use to check blood sugar up to 3 times daily. 1 kit 0   glipiZIDE  (GLUCOTROL  XL) 10 MG 24 hr tablet Take 1 tablet (10 mg total) by mouth 2 (two) times daily after a meal. 60 tablet 4   glucose blood (TRUE METRIX BLOOD GLUCOSE TEST) test strip Use to check blood sugar up to 3 times daily. 100 each 2   Insulin  Pen Needle (TECHLITE PEN NEEDLES) 32G X 4 MM MISC Use as directed. 100 each 3   QUEtiapine  (SEROQUEL ) 400 MG tablet Take 1 tablet (400 mg total) by mouth 2 (two) times daily.  60 tablet 0   rosuvastatin  (CRESTOR ) 40 MG tablet Take 1 tablet (40 mg total) by mouth once daily. 90 tablet 1   TRUEplus Lancets 28G MISC Use to check blood sugar up to 3 times daily. 100 each 3   celecoxib  (CELEBREX ) 200 MG capsule Take 1 capsule (200 mg total) by mouth every 12 (twelve) hours. 60 capsule 2   Insulin  Glargine (BASAGLAR  KWIKPEN) 100 UNIT/ML Inject 50 Units into the skin daily. bedtime 9 mL 3   insulin  lispro (HUMALOG  KWIKPEN) 100 UNIT/ML KwikPen Inject 5 Units into the skin 3 (three) times daily. 6 mL 2   methocarbamol  (ROBAXIN ) 500 MG tablet Take 1 tablet (500 mg total) by mouth every 8 (eight) hours as needed for muscle  spasms. (Patient not taking: Reported on 01/27/2024) 90 tablet 0   No facility-administered medications prior to visit.     ROS Review of Systems  Constitutional:  Negative for activity change and appetite change.  HENT:  Negative for sinus pressure and sore throat.   Respiratory:  Negative for chest tightness, shortness of breath and wheezing.   Cardiovascular:  Negative for chest pain and palpitations.  Gastrointestinal:  Negative for abdominal distention, abdominal pain and constipation.  Genitourinary: Negative.   Musculoskeletal: Negative.   Psychiatric/Behavioral:  Negative for behavioral problems and dysphoric mood.     Objective:  BP (!) 164/89   Pulse 86   Ht 5\' 7"  (1.702 m)   Wt 145 lb (65.8 kg)   SpO2 100%   BMI 22.71 kg/m      01/27/2024   11:04 AM 09/28/2023    3:25 PM 09/28/2023    3:13 PM  BP/Weight  Systolic BP 164 105 162  Diastolic BP 89 72 92  Wt. (Lbs) 145  141  BMI 22.71 kg/m2  22.08 kg/m2      Physical Exam Constitutional:      Appearance: She is well-developed.  Cardiovascular:     Rate and Rhythm: Normal rate.     Heart sounds: Normal heart sounds. No murmur heard. Pulmonary:     Effort: Pulmonary effort is normal.     Breath sounds: Normal breath sounds. No wheezing or rales.  Chest:     Chest wall: No  tenderness.  Abdominal:     General: Bowel sounds are normal. There is no distension.     Palpations: Abdomen is soft. There is no mass.     Tenderness: There is no abdominal tenderness.  Musculoskeletal:        General: Normal range of motion.     Right lower leg: No edema.     Left lower leg: No edema.  Neurological:     Mental Status: She is alert and oriented to person, place, and time.  Psychiatric:        Mood and Affect: Mood normal.        Latest Ref Rng & Units 09/28/2023    3:59 PM 04/13/2023    8:09 PM 03/31/2023   10:40 AM  CMP  Glucose 70 - 99 mg/dL 630  160  109   BUN 8 - 27 mg/dL 15   17   Creatinine 3.23 - 1.00 mg/dL 5.57   3.22   Sodium 025 - 144 mmol/L 138   141   Potassium 3.5 - 5.2 mmol/L 4.5   3.7   Chloride 96 - 106 mmol/L 98   108   CO2 20 - 29 mmol/L 23   25   Calcium  8.7 - 10.3 mg/dL 42.7   9.3   Total Protein 6.0 - 8.5 g/dL 7.3     Total Bilirubin 0.0 - 1.2 mg/dL <0.6     Alkaline Phos 44 - 121 IU/L 134     AST 0 - 40 IU/L 12     ALT 0 - 32 IU/L 15       Lipid Panel     Component Value Date/Time   CHOL 153 11/11/2022 1140   TRIG 210 (H) 11/11/2022 1140   HDL 41 11/11/2022 1140   CHOLHDL 3.7 11/11/2022 1140   CHOLHDL 10.6 (H) 07/02/2016 1105   VLDL NOT CALC 07/02/2016 1105   LDLCALC 77 11/11/2022 1140    CBC    Component Value Date/Time  WBC 7.3 03/31/2023 1040   RBC 4.04 03/31/2023 1040   HGB 12.3 03/31/2023 1040   HGB 13.6 04/11/2021 0859   HCT 39.0 03/31/2023 1040   HCT 43.2 04/11/2021 0859   PLT 313 03/31/2023 1040   PLT 449 04/11/2021 0859   MCV 96.5 03/31/2023 1040   MCV 92 04/11/2021 0859   MCH 30.4 03/31/2023 1040   MCHC 31.5 03/31/2023 1040   RDW 14.9 03/31/2023 1040   RDW 15.6 (H) 04/11/2021 0859   LYMPHSABS 4.3 (H) 04/11/2021 0859   MONOABS 0.6 12/29/2020 0520   EOSABS 0.3 04/11/2021 0859   BASOSABS 0.1 04/11/2021 0859    Lab Results  Component Value Date   HGBA1C 12.5 (A) 01/27/2024       Assessment &  Plan Type 2 diabetes mellitus with hyperglycemia A1c increased to 12.5% due to poor glycemic control from dietary indiscretions and inconsistent medication adherence. Current insulin  regimen is suboptimal. -Informs me she has been administering 30 units of NovoLog  nightly and 50 units of Lantus  in the morning - Adjust Humalog  to 10 units three times daily with meals. - Increase Lantus  to 55 units daily. - Continue glipizide  as prescribed. - Order comprehensive metabolic panel - Perform foot exam.  Hypertension -Diet controlled Blood pressure improved, no changes needed.  Bipolar disorder Managed with high-dose Seroquel  I have advised her is outside my scope of practice. -She is angry on finding out about this and goes on to start yelling about the fact that she has received this from Dr. Brent Cambric previously - She ran out of her Seroquel  due to her clinician retiring and I provided her 1 month supply but have advised her that she needs to establish with psychiatry for ongoing management but patient resistant to psychiatric referral, prefers single-location care. -Have checked with other clinicians in the practice, no other providers available to assume care. - Advise transfer of care to another primary care practice that can manage both medical conditions and bipolar disorder. - I have also placed an urgent referral to [facility] behavioral health for management of her bipolar disorder and optimization of her medication      Meds ordered this encounter  Medications   insulin  lispro (HUMALOG  KWIKPEN) 100 UNIT/ML KwikPen    Sig: Inject 10 Units into the skin 3 (three) times daily.    Dispense:  30 mL    Refill:  2   Insulin  Glargine (BASAGLAR  KWIKPEN) 100 UNIT/ML    Sig: Inject 55 Units into the skin daily. bedtime    Dispense:  9 mL    Refill:  3    Dose increased    Follow-up: Return for Medical conditions, patient will be transferring care to another practice per request..        Joaquin Mulberry, MD, FAAFP. [facility] [place], [place] [phone]   01/27/2024, 11:42 AM

## 2024-01-27 NOTE — Patient Instructions (Addendum)
 VISIT SUMMARY:  You came in today for a routine follow-up visit. We discussed your ongoing management of bipolar disorder, diabetes, and hypertension. We made some adjustments to your diabetes medications to improve your blood sugar control. We also talked about your current medications and the need for a more integrated approach to your care.  YOUR PLAN:  -TYPE 2 DIABETES MELLITUS WITH HYPERGLYCEMIA: This means your blood sugar levels are too high. We need to improve your blood sugar control by adjusting your insulin  doses. You should take 10 units of Humalog  three times daily with meals and increase your Lantus  to 55 units daily. Continue taking glipizide  as prescribed. We will also check your kidney and liver function and perform a foot exam.  -HYPERTENSION: This means you have high blood pressure. Your blood pressure has improved, so no changes to your medication are needed at this time.  -BIPOLAR DISORDER: This is a mental health condition that affects your mood. You are currently taking a high dose of Seroquel , and you prefer not to change this medication or see a psychiatrist.  I provided you just a 1 month supply of your Seroquel .  We recommend transferring your care to another primary care practice that can manage both your medical conditions and bipolar disorder.  INSTRUCTIONS:  Please follow the new insulin  regimen and continue taking your other medications as prescribed. We will check your kidney and liver function and perform a foot exam. Consider transferring your care to a primary care practice that can manage both your medical conditions and bipolar disorder as no other clinician in this practice is willing to assume your care.  I have placed a referral to psychiatry for you.

## 2024-01-28 LAB — CMP14+EGFR
ALT: 21 IU/L (ref 0–32)
AST: 16 IU/L (ref 0–40)
Albumin: 4.5 g/dL (ref 3.9–4.9)
Alkaline Phosphatase: 135 IU/L — ABNORMAL HIGH (ref 44–121)
BUN/Creatinine Ratio: 22 (ref 12–28)
BUN: 25 mg/dL (ref 8–27)
Bilirubin Total: <0.2 mg/dL (ref 0.0–1.2)
CO2: 21 mmol/L (ref 20–29)
Calcium: 10.1 mg/dL (ref 8.7–10.3)
Chloride: 100 mmol/L (ref 96–106)
Creatinine, Ser: 1.14 mg/dL — ABNORMAL HIGH (ref 0.57–1.00)
Globulin, Total: 2.4 g/dL (ref 1.5–4.5)
Glucose: 414 mg/dL — ABNORMAL HIGH (ref 70–99)
Potassium: 4.7 mmol/L (ref 3.5–5.2)
Sodium: 137 mmol/L (ref 134–144)
Total Protein: 6.9 g/dL (ref 6.0–8.5)
eGFR: 54 mL/min/{1.73_m2} — ABNORMAL LOW (ref >59)

## 2024-02-01 ENCOUNTER — Other Ambulatory Visit: Payer: Self-pay

## 2024-02-02 ENCOUNTER — Other Ambulatory Visit: Payer: Self-pay

## 2024-02-12 ENCOUNTER — Telehealth: Payer: Self-pay

## 2024-02-12 NOTE — Telephone Encounter (Signed)
 Please see patient call note and advise on patients medication refill.

## 2024-02-12 NOTE — Telephone Encounter (Signed)
 Copied from CRM [id number]. Topic: Clinical - Prescription Issue >> Feb 12, 2024  2:16 PM Crispin Dolphin wrote:  Reason for CRM: Patient called. States she has appt with another provider 6/25. She said she spoke with the providers office and they said that current pcp should be able to prescribe QUEtiapine  (SEROQUEL ) 400 MG tablet one more time until patient is able to get seen with their office. Patient would like to know why provider can not refill medication. States she does not want to pay extra money for a psychiatrist. This is the dose that was prescribed by Dr Brent Cambric and someone should review his notes per patient. Thank You

## 2024-02-12 NOTE — Telephone Encounter (Signed)
 Copied from CRM [id number]. Topic: Clinical - Prescription Issue >> Feb 11, 2024  4:23 PM [name] wrote: Reason for CRM: Patient has an appt on 6/25 with Dr. Boston Byers to get a refill on QUEtiapine  (SEROQUEL ) 400 MG tablet. The patient is switching providers because Dr.Wright has retired and the patient prefers a female Librarian, academic. The patient will be out of her seroquel  before the appointment. Patient wants know, can we provide her with a sample until 6/25?Aaron Aas The patient can be contacted by phone at [phone].  [facility] [address], [address] [place] [address] Phone: [phone] Fax: [phone]  Please see pt call msg and advise; pt is scheduled with you as New Pt in June but will run out of medication before this appt. Please advise

## 2024-02-13 NOTE — Telephone Encounter (Signed)
 Thank you for your message seeking medical advice.* I appreciate you reaching out about your quetiapine  (Seroquel ) 400mg  prescription. I understand this is an important medication for managing your bipolar disorder and that you're concerned about maintaining your treatment until your upcoming appointment on 6/25.  After you requested we review your records, I see you were recently evaluated by Joaquin Mulberry, MD, a provider in our organization who provided a one-month supply of Seroquel  400mg  and documented specific recommendations regarding your care plan. The notes indicate that this provider recommended transferring your care to a practice that can manage both your medical conditions and bipolar disorder, as high-dose antipsychotic medications typically require specialized monitoring.  Since you've already established a relationship with this provider who evaluated you and created a specific bridge plan, it would be most appropriate for you to contact that physician's office directly regarding any additional medication needs until your June 25th appointment. Having multiple providers managing the same high-dose psychiatric medication without direct coordination could potentially create safety concerns and confusion in your care plan.  I recommend calling that physician's office directly to discuss your medication needs. They are already familiar with your specific situation and would be in the best position to determine if an additional bridge prescription is appropriate before your upcoming appointment.  IMPORTANT - Seek immediate emergency care if you experience: - Thoughts of harming yourself or others - Severe mood changes that affect your ability to function or stay safe - Unusual or severe confusion or agitation - Severe side effects from medication including high fever, muscle stiffness, confusion, or irregular heartbeat  Sincerely,  Anthon Kins, MD   *This exchange required the  expertise of a doctor, nurse practitioner, physician assistant, optometrist or certified nurse midwife and qualifies as a Medical Advice Message, please visit [email or web address] for more details. [facility] will bill your insurance on your behalf; copays and deductibles may apply. Questions? Reply to this message.

## 2024-02-15 NOTE — Telephone Encounter (Signed)
 See my notes from my last encounter.  You can provide her with urgent behavioral health services for her Seroquel  refill her until her appointment with psych.  This was explained to her at her last visit by myself and [place] and she chose to transfer care elsewhere.

## 2024-02-15 NOTE — Telephone Encounter (Signed)
 Please see response from previous provider on emergency refill until patient appt with psych

## 2024-02-16 NOTE — Telephone Encounter (Signed)
 Please see messages from patient calls and advise is there anything we can do to assist this patient.

## 2024-02-16 NOTE — Telephone Encounter (Addendum)
 Patient called Verified name & DOB, Patient was very upset, screaming during the call. She stated that she urgently needs a refill for her Seroquel  prescription. Routing this message to the Research officer, political party and Triage Nurse.

## 2024-02-17 ENCOUNTER — Other Ambulatory Visit: Payer: Self-pay | Admitting: Family Medicine

## 2024-02-17 ENCOUNTER — Other Ambulatory Visit: Payer: Self-pay

## 2024-02-17 DIAGNOSIS — F3289 Other specified depressive episodes: Secondary | ICD-10-CM

## 2024-02-17 NOTE — Telephone Encounter (Signed)
 Duplicate, please see refill encounter today addressed by RN Jerryl Morin.  Copied from CRM [id number]. Topic: Clinical - Medication Question >> Feb 17, 2024  9:32 AM El Gravely T wrote: Reason for CRM: Patient calling inquiring on medication of QUEtiapine  (SEROQUEL ) 400 MG tablet.  Patient stated there No openings for provider she was referred to psychiatry.  Patient would like to know what she should do in order to continue receiving above medication, as she is unable to wait until that time to receive medication.    Patient requested to speak with office. Attempted to contact office, no response.

## 2024-02-17 NOTE — Telephone Encounter (Signed)
 Pt called in again upset about her Seroquel , asking why she did not get at least a month supply. I advised to her that a rx was sent back on 4/16 to the pharmacy that she has not yet picked up. Any future refills are to be prescribed by the psychiatrist. Transferred patient to the pharmacy.

## 2024-02-17 NOTE — Telephone Encounter (Signed)
 Patient calling about rx request, advised per Dr Markham Silence note to reach out to [facility].   "For your immediate medication needs, please contact our urgent behavioral health services at   [facility]   Phone: [phone]  Address  [address]  [place], [place] [address]  Hours: Open 24/7. No appointment required.   They are equipped to manage your prescription needs appropriately until you establish care with your new psychiatry provider.   "   Provided phone number, patient stated she would reach out to them and call back if they are unable to help.

## 2024-02-17 NOTE — Telephone Encounter (Signed)
 Requested medication (s) are due for refill today - yes  Requested medication (s) are on the active medication list -yes  Future visit scheduled -no  Last refill: 01/20/24 #60  Notes to clinic: non delegated Rx  Requested Prescriptions  Pending Prescriptions Disp Refills   QUEtiapine  (SEROQUEL ) 400 MG tablet 60 tablet 0    Sig: Take 1 tablet (400 mg total) by mouth 2 (two) times daily.     Not Delegated - Psychiatry:  Antipsychotics - Second Generation (Atypical) - quetiapine  Failed - 02/17/2024 10:12 AM      Failed - This refill cannot be delegated      Failed - TSH in normal range and within 360 days    TSH  Date Value Ref Range Status  11/12/2020 0.970 0.450 - 4.500 uIU/mL Final         Failed - Last BP in normal range    BP Readings from Last 1 Encounters:  01/27/24 (!) 164/89         Failed - Lipid Panel in normal range within the last 12 months    Cholesterol, Total  Date Value Ref Range Status  11/11/2022 153 100 - 199 mg/dL Final   LDL Chol Calc (NIH)  Date Value Ref Range Status  11/11/2022 77 0 - 99 mg/dL Final   HDL  Date Value Ref Range Status  11/11/2022 41 >39 mg/dL Final   Triglycerides  Date Value Ref Range Status  11/11/2022 210 (H) 0 - 149 mg/dL Final         Failed - CBC within normal limits and completed in the last 12 months    WBC  Date Value Ref Range Status  03/31/2023 7.3 4.0 - 10.5 K/uL Final   RBC  Date Value Ref Range Status  03/31/2023 4.04 3.87 - 5.11 MIL/uL Final   Hemoglobin  Date Value Ref Range Status  03/31/2023 12.3 12.0 - 15.0 g/dL Final  30/86/5784 69.6 11.1 - 15.9 g/dL Final   HCT  Date Value Ref Range Status  03/31/2023 39.0 36.0 - 46.0 % Final   Hematocrit  Date Value Ref Range Status  04/11/2021 43.2 34.0 - 46.6 % Final   MCHC  Date Value Ref Range Status  03/31/2023 31.5 30.0 - 36.0 g/dL Final   [facility]  Date Value Ref Range Status  03/31/2023 30.4 26.0 - 34.0 pg Final   MCV  Date Value Ref Range  Status  03/31/2023 96.5 80.0 - 100.0 fL Final  04/11/2021 92 79 - 97 fL Final   No results found for: "PLTCOUNTKUC", "LABPLAT", "POCPLA" RDW  Date Value Ref Range Status  03/31/2023 14.9 11.5 - 15.5 % Final  04/11/2021 15.6 (H) 11.7 - 15.4 % Final         Passed - Completed PHQ-2 or PHQ-9 in the last 360 days      Passed - Last Heart Rate in normal range    Pulse Readings from Last 1 Encounters:  01/27/24 86         Passed - Valid encounter within last 6 months    Recent Outpatient Visits           3 weeks ago Type 2 diabetes mellitus with hyperglycemia, with long-term current use of insulin  (HCC)   [facility] - A Dept Of [place]. [facility] Joaquin Mulberry, MD   4 months ago Renal insufficiency   [facility] - A Dept Of [place]. [facility]  Dulce Gibbs M, PA-C   9 months ago Uncontrolled type 2 diabetes mellitus with hyperglycemia [facility])   [facility] - A Dept Of [place]. [facility] Vernell Goldsmith, MD   11 months ago Type 2 diabetes mellitus with hyperglycemia, with long-term current use of insulin  [facility])   [facility] - A Dept Of [place]. [facility] Freada Jacobs, [place] L, RPH-CPP   12 months ago Uncontrolled type 2 diabetes mellitus with hyperglycemia [facility])   [facility] - A Dept Of . [facility], [place] L, RPH-CPP              Passed - CMP within normal limits and completed in the last 12 months    Albumin   Date Value Ref Range Status  01/27/2024 4.5 3.9 - 4.9 g/dL Final   Alkaline Phosphatase  Date Value Ref Range Status  01/27/2024 135 (H) 44 - 121 IU/L Final   ALT  Date Value Ref Range Status  01/27/2024 21 0 - 32 IU/L Final   AST  Date Value Ref Range Status  01/27/2024 16 0 - 40 IU/L Final   BUN  Date Value Ref Range Status  01/27/2024 25 8 - 27 mg/dL Final   Calcium    Date Value Ref Range Status  01/27/2024 10.1 8.7 - 10.3 mg/dL Final   CO2  Date Value Ref Range Status  01/27/2024 21 20 - 29 mmol/L Final   Bicarbonate  Date Value Ref Range Status  05/18/2017 20.4 20.0 - 28.0 mmol/L Final   TCO2  Date Value Ref Range Status  05/17/2017 22 0 - 100 mmol/L Final   Creat  Date Value Ref Range Status  08/04/2016 0.88 0.50 - 1.05 mg/dL Final    Comment:      For patients > or = 65 years of age: The upper reference limit for Creatinine is approximately 13% higher for people identified as African-American.      Creatinine, Ser  Date Value Ref Range Status  01/27/2024 1.14 (H) 0.57 - 1.00 mg/dL Final   Creatinine, Urine  Date Value Ref Range Status  07/02/2016 CANCELED 20 - 320 mg/dL     Comment:    Test not performed, no urine was received.    Result canceled by the ancillary    Glucose  Date Value Ref Range Status  01/27/2024 414 (H) 70 - 99 mg/dL Final   Glucose, Bld  Date Value Ref Range Status  04/13/2023 407 (H) 70 - 99 mg/dL Final    Comment:    Glucose reference range applies only to samples taken after fasting for at least 8 hours. Performed at [facility], [address] [place], [place] [address]    POC Glucose  Date Value Ref Range Status  05/19/2023 193 (A) 70 - 99 mg/dl Final   Glucose-Capillary  Date Value Ref Range Status  04/14/2023 83 70 - 99 mg/dL Final    Comment:    Glucose reference range applies only to samples taken after fasting for at least 8 hours.   Potassium  Date Value Ref Range Status  01/27/2024 4.7 3.5 - 5.2 mmol/L Final   Sodium  Date Value Ref Range Status  01/27/2024 137 134 - 144 mmol/L Final   Bilirubin Total  Date Value Ref Range Status  01/27/2024 <0.2 0.0 - 1.2 mg/dL Final   Bilirubin, Direct  Date Value Ref Range Status  05/17/2017 <  0.1 (L) 0.1 - 0.5 mg/dL Final   Indirect Bilirubin  Date Value Ref Range Status  05/17/2017 NOT CALCULATED 0.3 - 0.9 mg/dL  Final   Protein, ur  Date Value Ref Range Status  12/30/2020 NEGATIVE NEGATIVE mg/dL Final   Total Protein  Date Value Ref Range Status  01/27/2024 6.9 6.0 - 8.5 g/dL Final   GFR calc Af Amer  Date Value Ref Range Status  11/12/2020 72 >59 mL/min/1.73 Final    Comment:    **In accordance with recommendations from the NKF-ASN Task force,**   [facility] is in the process of updating its eGFR calculation to the   2021 CKD-EPI creatinine equation that estimates kidney function   without a race variable.    eGFR  Date Value Ref Range Status  01/27/2024 54 (L) >59 mL/min/1.73 Final   GFR, Estimated  Date Value Ref Range Status  03/31/2023 >60 >60 mL/min Final    Comment:    (NOTE) Calculated using the CKD-EPI Creatinine Equation (2021)             Requested Prescriptions  Pending Prescriptions Disp Refills   QUEtiapine  (SEROQUEL ) 400 MG tablet 60 tablet 0    Sig: Take 1 tablet (400 mg total) by mouth 2 (two) times daily.     Not Delegated - Psychiatry:  Antipsychotics - Second Generation (Atypical) - quetiapine  Failed - 02/17/2024 10:12 AM      Failed - This refill cannot be delegated      Failed - TSH in normal range and within 360 days    TSH  Date Value Ref Range Status  11/12/2020 0.970 0.450 - 4.500 uIU/mL Final         Failed - Last BP in normal range    BP Readings from Last 1 Encounters:  01/27/24 (!) 164/89         Failed - Lipid Panel in normal range within the last 12 months    Cholesterol, Total  Date Value Ref Range Status  11/11/2022 153 100 - 199 mg/dL Final   LDL Chol Calc (NIH)  Date Value Ref Range Status  11/11/2022 77 0 - 99 mg/dL Final   HDL  Date Value Ref Range Status  11/11/2022 41 >39 mg/dL Final   Triglycerides  Date Value Ref Range Status  11/11/2022 210 (H) 0 - 149 mg/dL Final         Failed - CBC within normal limits and completed in the last 12 months    WBC  Date Value Ref Range Status  03/31/2023 7.3 4.0 - 10.5 K/uL  Final   RBC  Date Value Ref Range Status  03/31/2023 4.04 3.87 - 5.11 MIL/uL Final   Hemoglobin  Date Value Ref Range Status  03/31/2023 12.3 12.0 - 15.0 g/dL Final  23/55/7322 02.5 11.1 - 15.9 g/dL Final   HCT  Date Value Ref Range Status  03/31/2023 39.0 36.0 - 46.0 % Final   Hematocrit  Date Value Ref Range Status  04/11/2021 43.2 34.0 - 46.6 % Final   MCHC  Date Value Ref Range Status  03/31/2023 31.5 30.0 - 36.0 g/dL Final   [facility]  Date Value Ref Range Status  03/31/2023 30.4 26.0 - 34.0 pg Final   MCV  Date Value Ref Range Status  03/31/2023 96.5 80.0 - 100.0 fL Final  04/11/2021 92 79 - 97 fL Final   No results found for: "PLTCOUNTKUC", "LABPLAT", "POCPLA" RDW  Date Value Ref Range Status  03/31/2023 14.9 11.5 -  15.5 % Final  04/11/2021 15.6 (H) 11.7 - 15.4 % Final         Passed - Completed PHQ-2 or PHQ-9 in the last 360 days      Passed - Last Heart Rate in normal range    Pulse Readings from Last 1 Encounters:  01/27/24 86         Passed - Valid encounter within last 6 months    Recent Outpatient Visits           3 weeks ago Type 2 diabetes mellitus with hyperglycemia, with long-term current use of insulin  (HCC)   [facility] - A Dept Of [place]. [facility] Joaquin Mulberry, MD   4 months ago Renal insufficiency   [facility] - A Dept Of [place]. [facility], Shelvy Dickens M, [place]   9 months ago Uncontrolled type 2 diabetes mellitus with hyperglycemia [facility])   [facility] [facility] - A Dept Of [place]. [facility] Vernell Goldsmith, MD   11 months ago Type 2 diabetes mellitus with hyperglycemia, with long-term current use of insulin  [facility])   [facility] [facility] - A Dept Of [place]. [facility] Freada Jacobs, [place] L, RPH-CPP   12 months ago Uncontrolled type 2 diabetes mellitus with hyperglycemia [facility])   [place] [facility] - A Dept  Of . [facility], [place] L, RPH-CPP              Passed - CMP within normal limits and completed in the last 12 months    Albumin   Date Value Ref Range Status  01/27/2024 4.5 3.9 - 4.9 g/dL Final   Alkaline Phosphatase  Date Value Ref Range Status  01/27/2024 135 (H) 44 - 121 IU/L Final   ALT  Date Value Ref Range Status  01/27/2024 21 0 - 32 IU/L Final   AST  Date Value Ref Range Status  01/27/2024 16 0 - 40 IU/L Final   BUN  Date Value Ref Range Status  01/27/2024 25 8 - 27 mg/dL Final   Calcium   Date Value Ref Range Status  01/27/2024 10.1 8.7 - 10.3 mg/dL Final   CO2  Date Value Ref Range Status  01/27/2024 21 20 - 29 mmol/L Final   Bicarbonate  Date Value Ref Range Status  05/18/2017 20.4 20.0 - 28.0 mmol/L Final   TCO2  Date Value Ref Range Status  05/17/2017 22 0 - 100 mmol/L Final   Creat  Date Value Ref Range Status  08/04/2016 0.88 0.50 - 1.05 mg/dL Final    Comment:      For patients > or = 65 years of age: The upper reference limit for Creatinine is approximately 13% higher for people identified as African-American.      Creatinine, Ser  Date Value Ref Range Status  01/27/2024 1.14 (H) 0.57 - 1.00 mg/dL Final   Creatinine, Urine  Date Value Ref Range Status  07/02/2016 CANCELED 20 - 320 mg/dL     Comment:    Test not performed, no urine was received.    Result canceled by the ancillary    Glucose  Date Value Ref Range Status  01/27/2024 414 (H) 70 - 99 mg/dL Final   Glucose, Bld  Date Value Ref Range Status  04/13/2023 407 (H) 70 - 99 mg/dL Final    Comment:    Glucose reference range applies  only to samples taken after fasting for at least 8 hours. Performed at [facility], [address] [place], [place] [address]    POC Glucose  Date Value Ref Range Status  05/19/2023 193 (A) 70 - 99 mg/dl Final   Glucose-Capillary  Date Value Ref Range Status  04/14/2023 83 70 - 99 mg/dL  Final    Comment:    Glucose reference range applies only to samples taken after fasting for at least 8 hours.   Potassium  Date Value Ref Range Status  01/27/2024 4.7 3.5 - 5.2 mmol/L Final   Sodium  Date Value Ref Range Status  01/27/2024 137 134 - 144 mmol/L Final   Bilirubin Total  Date Value Ref Range Status  01/27/2024 <0.2 0.0 - 1.2 mg/dL Final   Bilirubin, Direct  Date Value Ref Range Status  05/17/2017 <0.1 (L) 0.1 - 0.5 mg/dL Final   Indirect Bilirubin  Date Value Ref Range Status  05/17/2017 NOT CALCULATED 0.3 - 0.9 mg/dL Final   Protein, ur  Date Value Ref Range Status  12/30/2020 NEGATIVE NEGATIVE mg/dL Final   Total Protein  Date Value Ref Range Status  01/27/2024 6.9 6.0 - 8.5 g/dL Final   GFR calc Af Amer  Date Value Ref Range Status  11/12/2020 72 >59 mL/min/1.73 Final    Comment:    **In accordance with recommendations from the NKF-ASN Task force,**   [facility] is in the process of updating its eGFR calculation to the   2021 CKD-EPI creatinine equation that estimates kidney function   without a race variable.    eGFR  Date Value Ref Range Status  01/27/2024 54 (L) >59 mL/min/1.73 Final   GFR, Estimated  Date Value Ref Range Status  03/31/2023 >60 >60 mL/min Final    Comment:    (NOTE) Calculated using the CKD-EPI Creatinine Equation (2021)

## 2024-02-17 NOTE — Telephone Encounter (Signed)
 Noted.

## 2024-02-18 ENCOUNTER — Ambulatory Visit (HOSPITAL_COMMUNITY)
Admission: EM | Admit: 2024-02-18 | Discharge: 2024-02-18 | Disposition: A | Discharge status: Home / Self Care | Payer: Self-pay

## 2024-02-18 ENCOUNTER — Telehealth: Payer: Self-pay | Admitting: Critical Care Medicine

## 2024-02-18 NOTE — Telephone Encounter (Signed)
 Patient was called and informed that medication can not be refilled.

## 2024-02-18 NOTE — Telephone Encounter (Signed)
 Patient has been informed by her provider and she also has spoken to our practice administrator to advise her that we will not be unable to refill requested medication(SEROQUEL  ) . Patient has been informed of this on several occasions. Patient has been urged to obtain an appointment with [facility].

## 2024-02-18 NOTE — Telephone Encounter (Addendum)
 Duplicate, Please see Telephone encounter from 02/12/2024.  Copied from CRM [id number]. Topic: Clinical - Prescription Issue >> Feb 18, 2024  2:11 PM [name] wrote:  Reason for CRM: Patient  is requesting a refill on QUEtiapine  (SEROQUEL ) 400 MG tablet. The patient use to see Dr. Brent Cambric but he's retired.  The patient has an appt on 6/25 with Dr.Morrison at [facility] [place]. The patient will be out of her seroquel  before the appointment. Patient wants know, can we provide her with a sample until 6/25?Aaron Aas The patient can be contacted by phone at [phone].    Preferred Pharmacy  [place] [facility]  [address] [place] [address]  Phone: [phone] Fax: [phone]   Patient Information

## 2024-02-18 NOTE — Telephone Encounter (Signed)
 Copied from CRM [id number]. Topic: Clinical - Prescription Issue >> Feb 18, 2024  2:11 PM [name] wrote:  Reason for CRM: Patient  is requesting a refill on QUEtiapine  (SEROQUEL ) 400 MG tablet. The patient use to see Dr. Brent Cambric but he's retired.  The patient has an appt on 6/25 with Dr.Morrison at [facility] [place]. The patient will be out of her seroquel  before the appointment. Patient wants know, can we provide her with a sample until 6/25?Aaron Aas The patient can be contacted by phone at [phone].    Preferred Pharmacy  [place] [facility]  [address] [place] [address]  Phone: [phone] Fax: [phone]   Patient Information

## 2024-02-18 NOTE — ED Notes (Signed)
Patient left without being seen by a provider.

## 2024-02-19 ENCOUNTER — Other Ambulatory Visit: Payer: Self-pay

## 2024-02-23 ENCOUNTER — Emergency Department (HOSPITAL_COMMUNITY)
Admission: EM | Admit: 2024-02-23 | Discharge: 2024-02-24 | Disposition: A | Discharge status: Home / Self Care | Payer: Self-pay | Attending: Emergency Medicine | Admitting: Emergency Medicine

## 2024-02-23 ENCOUNTER — Other Ambulatory Visit: Payer: Self-pay

## 2024-02-23 DIAGNOSIS — Z7984 Long term (current) use of oral hypoglycemic drugs: Secondary | ICD-10-CM | POA: Insufficient documentation

## 2024-02-23 DIAGNOSIS — F3289 Other specified depressive episodes: Secondary | ICD-10-CM

## 2024-02-23 DIAGNOSIS — Z760 Encounter for issue of repeat prescription: Secondary | ICD-10-CM | POA: Insufficient documentation

## 2024-02-23 DIAGNOSIS — E119 Type 2 diabetes mellitus without complications: Secondary | ICD-10-CM | POA: Insufficient documentation

## 2024-02-23 DIAGNOSIS — Z794 Long term (current) use of insulin: Secondary | ICD-10-CM | POA: Insufficient documentation

## 2024-02-23 NOTE — ED Triage Notes (Signed)
 Patient requesting prescription for her Seroquel  400 mg BID , last dose 2 days ago .

## 2024-02-24 ENCOUNTER — Other Ambulatory Visit (HOSPITAL_COMMUNITY): Payer: Self-pay

## 2024-02-24 ENCOUNTER — Other Ambulatory Visit: Payer: Self-pay

## 2024-02-24 ENCOUNTER — Other Ambulatory Visit (HOSPITAL_BASED_OUTPATIENT_CLINIC_OR_DEPARTMENT_OTHER): Payer: Self-pay

## 2024-02-24 MED ORDER — QUETIAPINE FUMARATE 400 MG PO TABS
400.0000 mg | ORAL_TABLET | Freq: Two times a day (BID) | ORAL | 0 refills | Status: DC
Start: 2024-02-24 — End: 2024-03-28
  Filled 2024-02-24 (×3): qty 60, 30d supply, fill #0

## 2024-02-24 MED ORDER — QUETIAPINE FUMARATE 100 MG PO TABS
400.0000 mg | ORAL_TABLET | Freq: Once | ORAL | Status: AC
  Administered 2024-02-24: 400 mg via ORAL
  Filled 2024-02-24: qty 4

## 2024-02-24 NOTE — ED Provider Notes (Signed)
 [place] EMERGENCY DEPARTMENT AT [facility] Provider Note   CSN: [id number] Arrival date & time: 02/23/24  2330     History  Chief Complaint  Patient presents with   Medication Refill    Ashley [name] is a 63 y.o. female history of diabetes, marijuana use, bipolar, peripheral vascular disease, anxiety presented for medication refill.  Patient states been 2 days since she has been able to take her Seroquel  which she takes 400 mg twice daily.  Patient states she has been on this for 20 years however stated that her primary care provider will not refill her prescription and that she has not slept in 2 days.  Patient has no other acute concerns at this time but just wants her medication refilled.  Home Medications Prior to Admission medications   Medication Sig Start Date End Date Taking? Authorizing Provider  ascorbic acid  (VITAMIN C) 500 MG tablet Take 1 tablet (500 mg total) by mouth daily. 12/29/20   Leona Rake, MD  Blood Glucose Monitoring Suppl (TRUE METRIX METER) w/Device KIT Use to check blood sugar up to 3 times daily. 03/17/22   Vernell Goldsmith, MD  glipiZIDE  (GLUCOTROL  XL) 10 MG 24 hr tablet Take 1 tablet (10 mg total) by mouth 2 (two) times daily after a meal. 09/28/23   McClung, Stan Eans, PA-C  glucose blood (TRUE METRIX BLOOD GLUCOSE TEST) test strip Use to check blood sugar up to 3 times daily. 05/19/23   Vernell Goldsmith, MD  Insulin  Glargine (BASAGLAR  [facility]) 100 UNIT/ML Inject 55 Units into the skin at bedtime. 01/27/24   Newlin, Enobong, MD  insulin  lispro (HUMALOG  KWIKPEN) 100 UNIT/ML KwikPen Inject 10 Units into the skin 3 (three) times daily. 01/27/24   Newlin, Enobong, MD  Insulin  Pen Needle (TECHLITE PEN NEEDLES) 32G X 4 MM MISC Use as directed. 09/28/23   Hassie Lint, PA-C  QUEtiapine  (SEROQUEL ) 400 MG tablet Take 1 tablet (400 mg total) by mouth 2 (two) times daily. 01/20/24   Newlin, Enobong, MD  rosuvastatin  (CRESTOR ) 40 MG tablet Take 1  tablet (40 mg total) by mouth once daily. 09/28/23   McClung, Angela M, PA-C  TRUEplus Lancets 28G MISC Use to check blood sugar up to 3 times daily. 05/19/23   Vernell Goldsmith, MD      Allergies    Patient has no known allergies.    Review of Systems   Review of Systems  Physical Exam Updated Vital Signs BP (!) 146/88 (BP Location: Right Arm)   Pulse 73   Temp 98.8 F (37.1 C)   Resp 16   SpO2 100%  Physical Exam Vitals reviewed.  Constitutional:      General: She is not in acute distress. HENT:     Head: Normocephalic and atraumatic.  Eyes:     Extraocular Movements: Extraocular movements intact.     Conjunctiva/sclera: Conjunctivae normal.     Pupils: Pupils are equal, round, and reactive to light.  Cardiovascular:     Rate and Rhythm: Normal rate and regular rhythm.     Pulses: Normal pulses.     Heart sounds: Normal heart sounds.     Comments: 2+ bilateral radial/dorsalis pedis pulses with regular rate Pulmonary:     Effort: Pulmonary effort is normal. No respiratory distress.     Breath sounds: Normal breath sounds.  Abdominal:     Palpations: Abdomen is soft.     Tenderness: There is no abdominal tenderness. There is no guarding  or rebound.  Musculoskeletal:        General: Normal range of motion.     Cervical back: Normal range of motion and neck supple.     Comments: 5 out of 5 bilateral grip/leg extension strength  Skin:    General: Skin is warm and dry.     Capillary Refill: Capillary refill takes less than 2 seconds.  Neurological:     General: No focal deficit present.     Mental Status: She is alert and oriented to person, place, and time.     Comments: Sensation intact in all 4 limbs  Psychiatric:        Mood and Affect: Mood normal.     ED Results / Procedures / Treatments   Labs (all labs ordered are listed, but only abnormal results are displayed) Labs Reviewed - No data to display  EKG None  Radiology No results  found.  Procedures Procedures    Medications Ordered in ED Medications - No data to display  ED Course/ Medical Decision Making/ A&P                                 Medical Decision Making  Ashley [name] 63 y.o. presented today for medication refill. Working DDx that I considered at this time includes, but not limited to, medication refill, withdrawal, manic episode.  R/o DDx: withdrawal, manic episode: These are considered less likely due to history of present illness, physical exam, labs/imaging findings  Review of prior external notes: None  Unique Tests and My Independent Interpretation: None  Social Determinants of Health: none  Discussion with Independent Historian: None  Discussion of Management of Tests: None  Risk: Medium: prescription drug management  Risk Stratification Score: None  Plan: On exam patient was no acute distress with stable vitals.  Patient's exam is unremarkable.  Patient states she is only here to have her Seroquel  refilled as she has not slept in 2 days due to not having the medication.  Patient states that her new primary care provider will not refill this.  After discussion with the patient she will get 1 dose of her Seroquel  here and I will refill her prescription but strongly recommended that she follow-up with her primary care provider and will provide information about the behavioral health urgent care.  When I discussed with the patient out the possibility of seeing a psychiatrist or the behavioral urgent care at this time patient is resistant to the idea but will provide her resources in case she changes her mind.  Patient was given return precautions. Patient stable for discharge at this time.  Patient verbalized understanding of plan.  This chart was dictated using voice recognition software.  Despite best efforts to proofread,  errors can occur which can change the documentation meaning.         Final Clinical Impression(s) /  ED Diagnoses Final diagnoses:  Medication refill    Rx / DC Orders ED Discharge Orders     None         Aryahna Spagna T, PA-C 02/24/24 0955    Jerilynn Montenegro, MD 02/25/24 [phone]

## 2024-02-24 NOTE — Discharge Instructions (Signed)
 Please follow-up with your primary care provider in regards to recent symptoms and ER visit.  Today we have refilled your prescription however you will need to speak with a primary care provider for long-term treatment.  I have also attached information about the behavioral health urgent care as well if you do change your mind and would like to speak with behavioral health.  If symptoms change or worsen please return to the ER.

## 2024-03-01 ENCOUNTER — Other Ambulatory Visit: Payer: Self-pay

## 2024-03-14 ENCOUNTER — Other Ambulatory Visit: Payer: Self-pay

## 2024-03-15 ENCOUNTER — Other Ambulatory Visit: Payer: Self-pay

## 2024-03-18 ENCOUNTER — Emergency Department (HOSPITAL_COMMUNITY)
Admission: EM | Admit: 2024-03-18 | Discharge: 2024-03-19 | Disposition: A | Discharge status: Other Acute Inpatient Hospital | Payer: Self-pay | Attending: Emergency Medicine | Admitting: Emergency Medicine

## 2024-03-18 ENCOUNTER — Other Ambulatory Visit: Payer: Self-pay

## 2024-03-18 ENCOUNTER — Ambulatory Visit: Payer: Self-pay

## 2024-03-18 ENCOUNTER — Telehealth: Payer: Self-pay | Admitting: Internal Medicine

## 2024-03-18 DIAGNOSIS — Z794 Long term (current) use of insulin: Secondary | ICD-10-CM | POA: Insufficient documentation

## 2024-03-18 DIAGNOSIS — F209 Schizophrenia, unspecified: Secondary | ICD-10-CM | POA: Insufficient documentation

## 2024-03-18 DIAGNOSIS — R45850 Homicidal ideations: Secondary | ICD-10-CM | POA: Insufficient documentation

## 2024-03-18 DIAGNOSIS — F32A Depression, unspecified: Secondary | ICD-10-CM | POA: Insufficient documentation

## 2024-03-18 DIAGNOSIS — R45851 Suicidal ideations: Secondary | ICD-10-CM | POA: Insufficient documentation

## 2024-03-18 LAB — RAPID URINE DRUG SCREEN, HOSP PERFORMED
Amphetamines: NOT DETECTED
Barbiturates: NOT DETECTED
Benzodiazepines: NOT DETECTED
Cocaine: POSITIVE — AB
Opiates: NOT DETECTED
Tetrahydrocannabinol: POSITIVE — AB

## 2024-03-18 LAB — COMPREHENSIVE METABOLIC PANEL WITH GFR
ALT: 23 U/L (ref 0–44)
AST: 23 U/L (ref 15–41)
Albumin: 3.7 g/dL (ref 3.5–5.0)
Alkaline Phosphatase: 94 U/L (ref 38–126)
Anion gap: 11 (ref 5–15)
BUN: 13 mg/dL (ref 8–23)
CO2: 22 mmol/L (ref 22–32)
Calcium: 9.2 mg/dL (ref 8.9–10.3)
Chloride: 108 mmol/L (ref 98–111)
Creatinine, Ser: 0.98 mg/dL (ref 0.44–1.00)
GFR, Estimated: >60 mL/min (ref >60)
Glucose, Bld: 174 mg/dL — ABNORMAL HIGH (ref 70–99)
Potassium: 4.2 mmol/L (ref 3.5–5.1)
Sodium: 141 mmol/L (ref 135–145)
Total Bilirubin: 0.3 mg/dL (ref 0.0–1.2)
Total Protein: 6.8 g/dL (ref 6.5–8.1)

## 2024-03-18 LAB — CBC
HCT: 37.6 % (ref 36.0–46.0)
Hemoglobin: 11.6 g/dL — ABNORMAL LOW (ref 12.0–15.0)
MCH: 30.1 pg (ref 26.0–34.0)
MCHC: 30.9 g/dL (ref 30.0–36.0)
MCV: 97.7 fL (ref 80.0–100.0)
Platelets: 346 10*3/uL (ref 150–400)
RBC: 3.85 MIL/uL — ABNORMAL LOW (ref 3.87–5.11)
RDW: 15 % (ref 11.5–15.5)
WBC: 9.7 10*3/uL (ref 4.0–10.5)
nRBC: 0 % (ref 0.0–0.2)

## 2024-03-18 LAB — ETHANOL: Alcohol, Ethyl (B): <15 mg/dL (ref <15)

## 2024-03-18 LAB — CBG MONITORING, ED: Glucose-Capillary: 169 mg/dL — ABNORMAL HIGH (ref 70–99)

## 2024-03-18 MED ORDER — INSULIN GLARGINE-YFGN 100 UNIT/ML ~~LOC~~ SOPN
55.0000 [IU] | PEN_INJECTOR | Freq: Every day | SUBCUTANEOUS | Status: DC
  Filled 2024-03-18: qty 3

## 2024-03-18 MED ORDER — INSULIN ASPART 100 UNIT/ML IJ SOLN
10.0000 [IU] | Freq: Three times a day (TID) | INTRAMUSCULAR | Status: DC
  Administered 2024-03-18 – 2024-03-19 (×2): 10 [IU] via SUBCUTANEOUS

## 2024-03-18 MED ORDER — GLIPIZIDE ER 10 MG PO TB24
10.0000 mg | ORAL_TABLET | Freq: Two times a day (BID) | ORAL | Status: DC
  Administered 2024-03-18 – 2024-03-19 (×2): 10 mg via ORAL
  Filled 2024-03-18 (×3): qty 1

## 2024-03-18 MED ORDER — ROSUVASTATIN CALCIUM 5 MG PO TABS
40.0000 mg | ORAL_TABLET | Freq: Every day | ORAL | Status: DC
  Administered 2024-03-18 – 2024-03-19 (×2): 40 mg via ORAL
  Filled 2024-03-18 (×2): qty 8

## 2024-03-18 MED ORDER — INSULIN GLARGINE-YFGN 100 UNIT/ML ~~LOC~~ SOLN
55.0000 [IU] | Freq: Every day | SUBCUTANEOUS | Status: DC
  Administered 2024-03-18: 55 [IU] via SUBCUTANEOUS
  Filled 2024-03-18 (×2): qty 0.55

## 2024-03-18 MED ORDER — QUETIAPINE FUMARATE 200 MG PO TABS
400.0000 mg | ORAL_TABLET | Freq: Two times a day (BID) | ORAL | Status: DC
  Administered 2024-03-18 (×2): 400 mg via ORAL
  Filled 2024-03-18 (×3): qty 2

## 2024-03-18 NOTE — ED Notes (Signed)
 Pt declined to provide a UA at this time.

## 2024-03-18 NOTE — ED Notes (Signed)
Belongings placed in locker number 1

## 2024-03-18 NOTE — ED Notes (Signed)
 TTS consult initiated with Odilia Bennett.

## 2024-03-18 NOTE — Telephone Encounter (Signed)
 FYI Only or Action Required?: FYI only for provider  Patient was last seen in primary care on 01/27/2024 by Joaquin Mulberry, MD. Called Nurse Triage reporting Depression. Symptoms began Ongoing - threatening suicide and homicide today. Interventions attempted: Nothing. Symptoms are: rapidly worsening.  Triage Disposition: No disposition on file.  Patient/caregiver understands and will follow disposition?: Yes Called EMS and transferred call. EMS dispatched - EMS dispatcher is [facility] [place] [facility].            Answer Assessment - Initial Assessment Questions 1. MAIN CONCERN: What happened that made you call today?     Can't take it anymore 2. RISK OF HARM - SUICIDAL IDEATION:  Do you ever have thoughts of hurting or killing yourself?  (e.g., yes, no, no but preoccupation with thoughts about death)   - WISH TO BE DEAD:  Have you wished you were dead or wished you could go to sleep and not wake up?   - INTENT:  Have you had any thoughts of hurting or killing yourself? (e.g., yes, no, N/A) If Yes, ask: Are you having these thoughts about killing yourself right NOW?   - PLAN: Have you thought about how you might do this? Do you have a specific plan for how you would do this? (e.g., gun, knife, overdose, no plan, N/A)   - ACCESS: If yes to PLAN, Do you have access to ? (e.g., pills, gun in house, knife in kitchen)     Yes - now  4. RISK OF HARM - SUICIDAL BEHAVIOR: Have you ever done anything, started to do anything, or prepared to do anything to end your life? (e.g., collected pills, bought a gun, wrote a suicide note, cut yourself, started but changed your mind)     Wants to kill herself and her sister 5. EVENTS AND STRESSORS: Has there been any new stress or recent changes in your life? 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 FUNCTIONAL IMPAIRMENT: How have things been going for you overall? Have you had more  difficulty than usual doing your normal daily activities?  (e.g., better, same, worse; self-care, school, work, interactions)     Poorly - many stress factors  Protocols used: Suicide Concerns-A-AH

## 2024-03-18 NOTE — ED Provider Notes (Signed)
  EMERGENCY DEPARTMENT AT [facility] Provider Note   CSN: [id number] Arrival date & time: 03/18/24  1011     Patient presents with: Homicidal and Suicidal   Ashley [name] is a 63 y.o. female.   HPI   63 year old female presents emergency department with concern for worsening depression/bipolar, feeling homicidal towards her sister as well as SI as well.  Patient states that she is currently living in an apartment that is overrun with cockroaches.  She states that the landlord is not doing anything to help this.  She is extremely frustrated in regards to this.  She is on Seroquel , has expressed difficulty with getting this prescription renewed.  She endorses worsening depression as well as bipolar symptoms.  For other reasons she has felt homicidal towards her sister however she states that this was brief and resolved.  She has also had thoughts of hurting herself but without a plan.  Is not acutely suicidal at this time.  She is requesting help with her psych medications and a living situation.  Denies any acute symptoms otherwise.  Prior to Admission medications   Medication Sig Start Date End Date Taking? Authorizing Provider  ascorbic acid  (VITAMIN C) 500 MG tablet Take 1 tablet (500 mg total) by mouth daily. 12/29/20   Leona Rake, MD  Blood Glucose Monitoring Suppl (TRUE METRIX METER) w/Device KIT Use to check blood sugar up to 3 times daily. 03/17/22   Vernell Goldsmith, MD  glipiZIDE  (GLUCOTROL  XL) 10 MG 24 hr tablet Take 1 tablet (10 mg total) by mouth 2 (two) times daily after a meal. 09/28/23   McClung, Stan Eans, PA-C  glucose blood (TRUE METRIX BLOOD GLUCOSE TEST) test strip Use to check blood sugar up to 3 times daily. 05/19/23   Vernell Goldsmith, MD  Insulin  Glargine (BASAGLAR  KWIKPEN) 100 UNIT/ML Inject 55 Units into the skin at bedtime. 01/27/24   Newlin, Enobong, MD  insulin  lispro (HUMALOG  KWIKPEN) 100 UNIT/ML KwikPen Inject 10 Units into the skin 3  (three) times daily. 01/27/24   Newlin, Enobong, MD  Insulin  Pen Needle (TECHLITE PEN NEEDLES) 32G X 4 MM MISC Use as directed. 09/28/23   Hassie Lint, PA-C  QUEtiapine  (SEROQUEL ) 400 MG tablet Take 1 tablet (400 mg total) by mouth 2 (two) times daily. 02/24/24   Denese Finn, PA-C  rosuvastatin  (CRESTOR ) 40 MG tablet Take 1 tablet (40 mg total) by mouth once daily. 09/28/23   McClung, Angela M, PA-C  TRUEplus Lancets 28G MISC Use to check blood sugar up to 3 times daily. 05/19/23   Vernell Goldsmith, MD    Allergies: Patient has no known allergies.    Review of Systems  Constitutional:  Negative for fever.  Respiratory:  Negative for shortness of breath.   Cardiovascular:  Negative for chest pain.  Gastrointestinal:  Negative for abdominal pain, diarrhea and vomiting.  Skin:  Negative for rash.  Neurological:  Negative for headaches.  Psychiatric/Behavioral:  Positive for agitation, dysphoric mood, sleep disturbance and suicidal ideas. Negative for hallucinations. The patient is nervous/anxious.     Updated Vital Signs BP 108/82 (BP Location: Right Arm)   Pulse 87   Temp 97.6 F (36.4 C)   Resp 16   SpO2 100%   Physical Exam Vitals and nursing note reviewed.  Constitutional:      General: She is not in acute distress.    Appearance: Normal appearance.  HENT:     Head: Normocephalic.  Mouth/Throat:     Mouth: Mucous membranes are moist.   Eyes:     Extraocular Movements: Extraocular movements intact.    Cardiovascular:     Rate and Rhythm: Normal rate.  Pulmonary:     Effort: Pulmonary effort is normal. No respiratory distress.  Abdominal:     Palpations: Abdomen is soft.     Tenderness: There is no abdominal tenderness.   Skin:    General: Skin is warm.   Neurological:     Mental Status: She is alert and oriented to person, place, and time. Mental status is at baseline.   Psychiatric:        Mood and Affect: Mood normal.     Comments: Tearful,  cooperative     (all labs ordered are listed, but only abnormal results are displayed) Labs Reviewed  COMPREHENSIVE METABOLIC PANEL WITH GFR - Abnormal; Notable for the following components:      Result Value   Glucose, Bld 174 (*)    All other components within normal limits  CBC - Abnormal; Notable for the following components:   RBC 3.85 (*)    Hemoglobin 11.6 (*)    All other components within normal limits  ETHANOL  RAPID URINE DRUG SCREEN, HOSP PERFORMED    EKG: None  Radiology: No results found.   Procedures   Medications Ordered in the ED - No data to display                                  Medical Decision Making Amount and/or Complexity of Data Reviewed Labs: ordered.   63 year old female presents emergency department after an episode of feeling homicidal towards her sister and intermittent feelings of SI.  She is not acutely suicidal at this time, has no active plan.  Currently is voluntary.  Denies any ingestion or attempt at this time.  Vitals are normal and stable.  She feels very displeased with her current living situation.  She states that this is making her depression and bipolar worse, she is also had difficulty obtaining her Seroquel  prescription.  Blood work is normal.  Patient refuses to give a urine sample.  Has been able to eat and drink without difficulty.  Again is not acutely suicidal, no indication for IVC at this time.  She is voluntary and willing to talk to psych.  Patient is medically cleared for TTS.  Stable at time of signout.     Final diagnoses:  None    ED Discharge Orders     None          Flonnie Humphrey, DO 03/18/24 1449

## 2024-03-18 NOTE — ED Notes (Signed)
TTS consult completed 

## 2024-03-18 NOTE — BH Assessment (Signed)
 Comprehensive Clinical Assessment (CCA) Note   03/18/2024 Ashley [name] [id number]  Disposition: Ashley Crawley, NP recommends inpatient hospitalization.   The patient demonstrates the following risk factors for suicide: Chronic risk factors for suicide include: psychiatric disorder of Schizophrenia . Acute risk factors for suicide include: unemployment and social withdrawal/isolation. Protective factors for this patient include: responsibility to others (children, family). Considering these factors, the overall suicide risk at this point appears to be low. Patient is not appropriate for outpatient follow up.    Per EDP's note : 63 year old female presents emergency department with concern for worsening depression/bipolar, feeling homicidal towards her sister as well as SI as well.  Patient states that she is currently living in an apartment that is overrun with cockroaches.  She states that the landlord is not doing anything to help this.  She is extremely frustrated in regards to this.  She is on Seroquel , has expressed difficulty with getting this prescription renewed.  She endorses worsening depression as well as bipolar symptoms.  For other reasons she has felt homicidal towards her sister however she states that this was brief and resolved.  She has also had thoughts of hurting herself but without a plan.  Is not acutely suicidal at this time.  She is requesting help with her psych medications and a living situation.  Denies any acute symptoms otherwise.  Upon evaluation with this clinician, the patient is alert, oriented x 3, and cooperative. Speech is clear, coherent and logical. Pt appears casual. Eye contact is fair. Mood is anxious and depressed; affect is congruent with mood. The thought process is logical and thought content is coherent. Pt endorses SI without a plan. Pt denies HI/AVH. There is no indication that the patient is responding to internal stimuli. No delusions elicited during  this assessment.         Chief Complaint:  Chief Complaint  Patient presents with   Homicidal   Suicidal   Visit Diagnosis: Schizophrenia    CCA Screening, Triage and Referral (STR)  Patient Reported Information How did you hear about us ? -- [facility] ED)  What Is the Reason for Your Visit/Call Today? Per EDP's note : 63 year old female presents emergency department with concern for worsening depression/bipolar, feeling homicidal towards her sister as well as SI as well.  Patient states that she is currently living in an apartment that is overrun with cockroaches.  She states that the landlord is not doing anything to help this.  She is extremely frustrated in regards to this.  She is on Seroquel , has expressed difficulty with getting this prescription renewed.  She endorses worsening depression as well as bipolar symptoms.  For other reasons she has felt homicidal towards her sister however she states that this was brief and resolved.  She has also had thoughts of hurting herself but without a plan.  Is not acutely suicidal at this time.  She is requesting help with her psych medications and a living situation.  Denies any acute symptoms otherwise.  How Long Has This Been Causing You Problems? > than 6 months  What Do You Feel Would Help You the Most Today? Treatment for Depression or other mood problem; Stress Management; Medication(s)   Have You Recently Had Any Thoughts About Hurting Yourself? Yes  Are You Planning to Commit Suicide/Harm Yourself At This time? No   Flowsheet Row ED from 03/18/2024 in [facility] Emergency Department at [facility] ED from 02/23/2024 in [facility] Emergency Department at [facility]  Hospital Pre-Admission Testing 45 from 03/31/2023 in [facility] PRE [facility]MISS[facility] TEST[facility]  C-SSRS RISK CATEGORY High Risk No Risk No Risk    Have you Recently Had Thoughts About Hurting Someone Ashley [name]? No  Are You Planning to Harm Someone at This  Time? No  Explanation: Denies HI   Have You Used Any Alcohol or Drugs in the Past 24 Hours? Yes  How Long Ago Did You Use Drugs or Alcohol? N/A What Did You Use and How Much? Marijuana ; unknown amount   Do You Currently Have a Therapist/Psychiatrist? Yes  Name of Therapist/Psychiatrist: Name of Therapist/Psychiatrist: Pt reports med management with : [facility]   Have You Been Recently Discharged From Any Office Practice or Programs? No  Explanation of Discharge From Practice/Program: N/A    CCA Screening Triage Referral Assessment Type of Contact: Tele-Assessment  Telemedicine Service Delivery: Telemedicine service delivery: This service was provided via telemedicine using a 2-way, interactive audio and video technology  Is this Initial or Reassessment? Is this Initial or Reassessment?: Initial Assessment  Date Telepsych consult ordered in [facility]:  Date Telepsych consult ordered in [facility]: 03/18/24  Time Telepsych consult ordered in [facility]:  Time Telepsych consult ordered in [facility]: 2008  Location of Assessment: [facility] ED  Provider Location: [facility] Assessment Services   Collateral Involvement: None   Does Patient Have a Automotive engineer Guardian? No  Legal Guardian Contact Information: n/a  Copy of Legal Guardianship Form: -- (n/a)  Legal Guardian Notified of Arrival: -- (n/a)  Legal Guardian Notified of Pending Discharge: -- (n/a)  If Minor and Not Living with Parent(s), Who has Custody? n/a  Is CPS involved or ever been involved? Never  Is APS involved or ever been involved? Never   Patient Determined To Be At Risk for Harm To Self or Others Based on Review of Patient Reported Information or Presenting Complaint? Yes, for Self-Harm  Method: No Plan  Availability of Means: No access or NA  Intent: Vague intent or NA  Notification Required: No need or identified person  Additional Information for Danger to Others Potential:  -- (n/a)  Additional Comments for Danger to Others Potential: n/a  Are There Guns or Other Weapons in Your Home? No  Types of Guns/Weapons: Denies access  Are These Weapons Safely Secured?                            No  Who Could Verify You Are Able To Have These Secured: Denies access  Do You Have any Outstanding Charges, Pending Court Dates, Parole/Probation? Denies pending legal charges  Contacted To Inform of Risk of Harm To Self or Others: -- (n/a)    Does Patient Present under Involuntary Commitment? No    [place] of Residence: [place]   Patient Currently Receiving the Following Services: Medication Management   Determination of Need: Urgent (48 hours)   Options For Referral: Inpatient Hospitalization; Medication Management     CCA Biopsychosocial Patient Reported Schizophrenia/Schizoaffective Diagnosis in Past: No   Strengths: Willing to seek treatment   Mental Health Symptoms Depression:  Change in energy/activity; Hopelessness; Irritability; Sleep (too much or little)   Duration of Depressive symptoms: Duration of Depressive Symptoms: Greater than two weeks   Mania:  None   Anxiety:   Restlessness; Worrying; Difficulty concentrating   Psychosis:  None   Duration of Psychotic symptoms:    Trauma:  None  Obsessions:  None   Compulsions:  None   Inattention:  None   Hyperactivity/Impulsivity:  None   Oppositional/Defiant Behaviors:  None   Emotional Irregularity:  None   Other Mood/Personality Symptoms:  n/a    Mental Status Exam Appearance and self-care  Stature:  Average   Weight:  Average weight   Clothing:  Casual (scrubs)   Grooming:  Normal   Cosmetic use:  None   Posture/gait:  Normal   Motor activity:  Not Remarkable   Sensorium  Attention:  Normal   Concentration:  Normal   Orientation:  X5   Recall/memory:  Normal   Affect and Mood  Affect:  Depressed; Labile   Mood:  Depressed   Relating  Eye  contact:  None   Facial expression:  Sad; Depressed   Attitude toward examiner:  Cooperative   Thought and Language  Speech flow: Clear and Coherent   Thought content:  Appropriate to Mood and Circumstances   Preoccupation:  Suicide   Hallucinations:  None   Organization:  Patent examiner of Knowledge:  Average   Intelligence:  Average   Abstraction:  Normal   Judgement:  Fair   Brewing technologist   Insight:  Fair   Decision Making:  Normal   Social Functioning  Social Maturity:  Impulsive   Social Judgement:  Impropriety   Stress  Stressors:  Family conflict; Housing   Coping Ability:  Normal   Skill Deficits:  Activities of daily living; Self-control; Self-care; Interpersonal   Supports:  Support needed     Religion: Religion/Spirituality Are You A Religious Person?: No How Might This Affect Treatment?: n/a  Leisure/Recreation: Leisure / Recreation Do You Have Hobbies?: No  Exercise/Diet: Exercise/Diet Do You Exercise?: No Have You Gained or Lost A Significant Amount of Weight in the Past Six Months?: No Do You Follow a Special Diet?: No Do You Have Any Trouble Sleeping?: Yes Explanation of Sleeping Difficulties: Pt reports that she has difficulty sleeping because of the roaches   CCA Employment/Education Employment/Work Situation: Employment / Work Situation Employment Situation: On disability Why is Patient on Disability: Mental Health How Long has Patient Been on Disability: Since 1994 Patient's Job has Been Impacted by Current Illness: No Has Patient ever Been in the [employer]?: No  Education: Education Is Patient Currently Attending School?: No Last Grade Completed: 12 Did You Attend College?: No Did You Have An Individualized Education Program (IIEP): No Did You Have Any Difficulty At School?: No Patient's Education Has Been Impacted by Current Illness: No   CCA Family/Childhood History Family  and Relationship History: Family history Marital status: Single Does patient have children?: Yes How many children?: 3 How is patient's relationship with their children?: UTA  Childhood History:  Childhood History By whom was/is the patient raised?: Mother Did patient suffer any verbal/emotional/physical/sexual abuse as a child?: No Did patient suffer from severe childhood neglect?: No Has patient ever been sexually abused/assaulted/raped as an adolescent or adult?: No Was the patient ever a victim of a crime or a disaster?: No Witnessed domestic violence?: No Has patient been affected by domestic violence as an adult?: No       CCA Substance Use Alcohol/Drug Use: Alcohol / Drug Use Pain Medications: See MAR Prescriptions: See MAR Over the Counter: See MAR History of alcohol / drug use?: Yes Longest period of sobriety (when/how long): Unknown Negative Consequences of Use:  (n/a) Withdrawal Symptoms: None Substance #1 Name of Substance  1: Marijuana 1 - Amount (size/oz): unknown 1 - Frequency: daily 1 - Last Use / Amount: Today                       ASAM's:  Six Dimensions of Multidimensional Assessment  Dimension 1:  Acute Intoxication and/or Withdrawal Potential:      Dimension 2:  Biomedical Conditions and Complications:      Dimension 3:  Emotional, Behavioral, or Cognitive Conditions and Complications:     Dimension 4:  Readiness to Change:     Dimension 5:  Relapse, Continued use, or Continued Problem Potential:     Dimension 6:  Recovery/Living Environment:     ASAM Severity Score:    ASAM Recommended Level of Treatment: ASAM Recommended Level of Treatment: Level II Intensive Outpatient Treatment   Substance use Disorder (SUD) Substance Use Disorder (SUD)  Checklist Symptoms of Substance Use: Continued use despite having a persistent/recurrent physical/psychological problem caused/exacerbated by use  Recommendations for  Services/Supports/Treatments: Recommendations for Services/Supports/Treatments Recommendations For Services/Supports/Treatments: Inpatient Hospitalization  Disposition Recommendation per psychiatric provider: We recommend inpatient psychiatric hospitalization when medically cleared. Patient is under voluntary admission status at this time; please IVC if attempts to leave hospital.   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   Encounter for screening for lung cancer 11/11/2022   H/O cervical spine surgery 09/03/2021   Slow transit constipation    Peripheral vascular disease (HCC) 10/31/2020   Right atrial enlargement 10/31/2020   Tobacco user 10/31/2020   Type 2 diabetes mellitus with diabetic autonomic (poly)neuropathy (HCC) 10/31/2020   Vitamin D  deficiency 10/31/2020   Lumbar degenerative disc disease 09/18/2019   Chronic midline low back pain without sciatica 10/15/2016   Essential hypertension 08/04/2016   Mixed dyslipidemia 08/04/2016   History of stroke 07/02/2016   Renal insufficiency 07/02/2016   Bipolar affective disorder in remission (HCC) 07/02/2016   Vaccine refused by patient 07/02/2016   Marijuana smoker 05/20/2013   Hot flashes, menopausal 05/20/2013   RBBB 05/20/2013   Anemia 05/20/2013   Depression 05/02/2013   Uncontrolled type 2 diabetes mellitus with hyperglycemia (HCC) 05/01/2013     Referrals to Alternative Service(s): Referred to Alternative Service(s):   Place:   Date:   Time:    Referred to Alternative Service(s):   Place:   Date:   Time:     Referred to Alternative Service(s):   Place:   Date:   Time:    Referred to Alternative Service(s):   Place:   Date:   Time:     [place] Do, [place], [facility]

## 2024-03-18 NOTE — ED Notes (Signed)
 Pt called family member, Saunders Curio, to provide update about stay.

## 2024-03-18 NOTE — ED Triage Notes (Signed)
 Pt feeling homicidal to her sister. SI as well.

## 2024-03-18 NOTE — Telephone Encounter (Signed)
 FYI:  Patient has an upcoming appointment to establish care with Dr. Boston Byers.    I have reached out to inform her that we would not be able to manage seroquel .    While I was talking to patient she has disclosed that she wanted to check herself into a hospital because she felt like self harming.    I have connected patient to the triage line.

## 2024-03-19 ENCOUNTER — Other Ambulatory Visit: Payer: Self-pay

## 2024-03-19 ENCOUNTER — Inpatient Hospital Stay
Admission: AD | Admit: 2024-03-19 | Discharge: 2024-03-24 | DRG: 885 | Disposition: A | Discharge status: Home / Self Care | Source: Intra-hospital | Attending: Psychiatry | Admitting: Psychiatry

## 2024-03-19 ENCOUNTER — Encounter: Payer: Self-pay | Admitting: Psychiatry

## 2024-03-19 DIAGNOSIS — F603 Borderline personality disorder: Secondary | ICD-10-CM

## 2024-03-19 DIAGNOSIS — F319 Bipolar disorder, unspecified: Secondary | ICD-10-CM | POA: Diagnosis present

## 2024-03-19 DIAGNOSIS — F17210 Nicotine dependence, cigarettes, uncomplicated: Secondary | ICD-10-CM | POA: Diagnosis present

## 2024-03-19 DIAGNOSIS — R45850 Homicidal ideations: Secondary | ICD-10-CM | POA: Diagnosis present

## 2024-03-19 DIAGNOSIS — F4323 Adjustment disorder with mixed anxiety and depressed mood: Secondary | ICD-10-CM | POA: Diagnosis present

## 2024-03-19 DIAGNOSIS — R45851 Suicidal ideations: Secondary | ICD-10-CM | POA: Diagnosis present

## 2024-03-19 DIAGNOSIS — I10 Essential (primary) hypertension: Secondary | ICD-10-CM | POA: Diagnosis present

## 2024-03-19 DIAGNOSIS — Z658 Other specified problems related to psychosocial circumstances: Secondary | ICD-10-CM

## 2024-03-19 DIAGNOSIS — Z7984 Long term (current) use of oral hypoglycemic drugs: Secondary | ICD-10-CM | POA: Diagnosis not present

## 2024-03-19 DIAGNOSIS — Z794 Long term (current) use of insulin: Secondary | ICD-10-CM

## 2024-03-19 DIAGNOSIS — E119 Type 2 diabetes mellitus without complications: Secondary | ICD-10-CM | POA: Diagnosis present

## 2024-03-19 DIAGNOSIS — E785 Hyperlipidemia, unspecified: Secondary | ICD-10-CM | POA: Diagnosis present

## 2024-03-19 DIAGNOSIS — F209 Schizophrenia, unspecified: Secondary | ICD-10-CM | POA: Diagnosis present

## 2024-03-19 DIAGNOSIS — Z9151 Personal history of suicidal behavior: Secondary | ICD-10-CM

## 2024-03-19 DIAGNOSIS — Z79899 Other long term (current) drug therapy: Secondary | ICD-10-CM

## 2024-03-19 DIAGNOSIS — Z8659 Personal history of other mental and behavioral disorders: Secondary | ICD-10-CM

## 2024-03-19 LAB — GLUCOSE, CAPILLARY
Glucose-Capillary: 196 mg/dL — ABNORMAL HIGH (ref 70–99)
Glucose-Capillary: 29 mg/dL — CL (ref 70–99)
Glucose-Capillary: 48 mg/dL — ABNORMAL LOW (ref 70–99)
Glucose-Capillary: 76 mg/dL (ref 70–99)

## 2024-03-19 LAB — CBG MONITORING, ED: Glucose-Capillary: 134 mg/dL — ABNORMAL HIGH (ref 70–99)

## 2024-03-19 LAB — SARS CORONAVIRUS 2 BY RT PCR: SARS Coronavirus 2 by RT PCR: NEGATIVE

## 2024-03-19 MED ORDER — LORAZEPAM 2 MG/ML IJ SOLN: 2.0000 mg | Freq: Three times a day (TID) | INTRAMUSCULAR | Status: DC | PRN

## 2024-03-19 MED ORDER — INSULIN ASPART 100 UNIT/ML IJ SOLN
0.0000 [IU] | Freq: Three times a day (TID) | INTRAMUSCULAR | Status: DC
  Administered 2024-03-19 – 2024-03-20 (×2): 2 [IU] via SUBCUTANEOUS
  Administered 2024-03-20: 5 [IU] via SUBCUTANEOUS
  Administered 2024-03-20: 2 [IU] via SUBCUTANEOUS
  Administered 2024-03-21: 3 [IU] via SUBCUTANEOUS
  Administered 2024-03-21: 2 [IU] via SUBCUTANEOUS
  Administered 2024-03-21: 3 [IU] via SUBCUTANEOUS
  Administered 2024-03-22: 2 [IU] via SUBCUTANEOUS
  Administered 2024-03-22: 7 [IU] via SUBCUTANEOUS
  Administered 2024-03-22 – 2024-03-23 (×2): 2 [IU] via SUBCUTANEOUS
  Administered 2024-03-23: 3 [IU] via SUBCUTANEOUS
  Administered 2024-03-23: 5 [IU] via SUBCUTANEOUS
  Administered 2024-03-24: 2 [IU] via SUBCUTANEOUS
  Filled 2024-03-19 (×5): qty 1

## 2024-03-19 MED ORDER — HALOPERIDOL LACTATE 5 MG/ML IJ SOLN: 10.0000 mg | Freq: Three times a day (TID) | INTRAMUSCULAR | Status: DC | PRN

## 2024-03-19 MED ORDER — HALOPERIDOL LACTATE 5 MG/ML IJ SOLN: 5.0000 mg | Freq: Three times a day (TID) | INTRAMUSCULAR | Status: DC | PRN

## 2024-03-19 MED ORDER — INSULIN ASPART 100 UNIT/ML IJ SOLN
10.0000 [IU] | Freq: Three times a day (TID) | INTRAMUSCULAR | Status: DC
  Administered 2024-03-19: 10 [IU] via SUBCUTANEOUS

## 2024-03-19 MED ORDER — MAGNESIUM HYDROXIDE 400 MG/5ML PO SUSP: 30.0000 mL | Freq: Every day | ORAL | Status: DC | PRN

## 2024-03-19 MED ORDER — GLIPIZIDE ER 10 MG PO TB24
10.0000 mg | ORAL_TABLET | Freq: Two times a day (BID) | ORAL | Status: DC
  Administered 2024-03-19 – 2024-03-20 (×2): 10 mg via ORAL
  Filled 2024-03-19 (×3): qty 1

## 2024-03-19 MED ORDER — INSULIN GLARGINE-YFGN 100 UNIT/ML ~~LOC~~ SOLN
55.0000 [IU] | Freq: Every day | SUBCUTANEOUS | Status: DC
  Filled 2024-03-19 (×2): qty 0.55

## 2024-03-19 MED ORDER — ALUM & MAG HYDROXIDE-SIMETH 200-200-20 MG/5ML PO SUSP: 30.0000 mL | ORAL | Status: DC | PRN

## 2024-03-19 MED ORDER — HALOPERIDOL 5 MG PO TABS: 5.0000 mg | ORAL_TABLET | Freq: Three times a day (TID) | ORAL | Status: DC | PRN

## 2024-03-19 MED ORDER — ACETAMINOPHEN 325 MG PO TABS: 650.0000 mg | ORAL_TABLET | Freq: Four times a day (QID) | ORAL | Status: DC | PRN

## 2024-03-19 MED ORDER — INSULIN ASPART 100 UNIT/ML IJ SOLN
0.0000 [IU] | Freq: Every day | INTRAMUSCULAR | Status: DC
  Administered 2024-03-21 – 2024-03-23 (×3): 2 [IU] via SUBCUTANEOUS
  Filled 2024-03-19 (×3): qty 1

## 2024-03-19 MED ORDER — QUETIAPINE FUMARATE 200 MG PO TABS
400.0000 mg | ORAL_TABLET | Freq: Two times a day (BID) | ORAL | Status: DC
  Administered 2024-03-19 – 2024-03-24 (×10): 400 mg via ORAL
  Filled 2024-03-19 (×11): qty 2

## 2024-03-19 MED ORDER — ROSUVASTATIN CALCIUM 10 MG PO TABS
40.0000 mg | ORAL_TABLET | Freq: Every day | ORAL | Status: DC
  Administered 2024-03-19 – 2024-03-24 (×6): 40 mg via ORAL
  Filled 2024-03-19 (×6): qty 4

## 2024-03-19 MED ORDER — DIPHENHYDRAMINE HCL 25 MG PO CAPS: 50.0000 mg | ORAL_CAPSULE | Freq: Three times a day (TID) | ORAL | Status: DC | PRN

## 2024-03-19 MED ORDER — HYDROXYZINE HCL 25 MG PO TABS
25.0000 mg | ORAL_TABLET | Freq: Once | ORAL | Status: AC
  Administered 2024-03-19: 25 mg via ORAL
  Filled 2024-03-19 (×2): qty 1

## 2024-03-19 MED ORDER — DIPHENHYDRAMINE HCL 50 MG/ML IJ SOLN: 50.0000 mg | Freq: Three times a day (TID) | INTRAMUSCULAR | Status: DC | PRN

## 2024-03-19 NOTE — Group Note (Signed)
 Date:  03/19/2024 Time:  10:25 PM  Group Topic/Focus:  Managing Feelings:   The focus of this group is to identify what feelings patients have difficulty handling and develop a plan to handle them in a healthier way upon discharge.    Participation Level:  Active  Participation Quality:  Appropriate  Affect:  Appropriate  Cognitive:  Appropriate  Insight: Good  Engagement in Group:  Engaged  Modes of Intervention:  Education  Additional Comments:    [name] 03/19/2024, 10:25 PM

## 2024-03-19 NOTE — Progress Notes (Signed)
 Patient is a 63 year old female admitted voluntarily with a diagnosis of schizophrenia to the [facility] floor from [facility] ED for complaints of depression, and having HI toward her sister. Per the chart, patient also made SI statements but she denies this. Patient presents to assessment ambulatory. She is A+O x 4. She currently denies SI/HI/AVH. Patient's affect is appropriate and speech is logical and coherent. Patient endorses depression but denies anxiety.  She currently denies pain. Patient denies the use of a mobility aid at home. Reports the use of reading glasses and has them on her person. Reports last BM yesterday March 18, 2024.  Patient denies smoking cigs and drinking alcohol. UDS + coc/marij. Patient says that her support system is her daughter [name] G. Her goal while she is here is "to get better."   Skin assessment and body search completed with Mya, MHT. Skin: warm/dry. Lower back scar and tattoo on R lower arm. No contrabands found.   Emotional support and reassurance provided throughout admission intake. Consents signed. Afterwards, oriented patient to unit, room and call light, reviewed POC with all questions answered and concerns voiced. Patient verbalized understanding. Denies any needs at this time.  Will continue to monitor with ongoing Q 15 minute safety checks.

## 2024-03-19 NOTE — ED Provider Notes (Signed)
 Emergency Medicine Observation Re-evaluation Note  Ashley [name] is a 63 y.o. female, seen on rounds today.  Pt initially presented to the ED for complaints of Homicidal and Suicidal Currently, the patient is awaiting inpatient placement. Likely placement at [facility] today.  Physical Exam  BP 122/74 (BP Location: Right Arm)   Pulse 84   Temp 98.1 F (36.7 C) (Oral)   Resp 19   SpO2 99%  Physical Exam General: Calm Cardiac: Well perfused Lungs: Even respirations Psych: Calm  ED Course / MDM  EKG:EKG Interpretation Date/Time:  Friday March 18 2024 12:41:23 EDT Ventricular Rate:  73 PR Interval:  182 QRS Duration:  80 QT Interval:  404 QTC Calculation: 445 R Axis:   52  Text Interpretation: Normal sinus rhythm Normal ECG When compared with ECG of 31-Mar-2023 10:51, PREVIOUS ECG IS PRESENT Confirmed by Florentino Hurdle [phone]) on 03/18/2024 12:50:01 PM  I have reviewed the labs performed to date as well as medications administered while in observation.  Recent changes in the last 24 hours include evaluated by TTS and recommending inpatient psych.   Dr. Jadapelle accepting patient to [facility]. Stable for transfer. EMTALA completed.   Plan  Current plan is for inpatient psych at [facility].     Roberts Ching, MD 03/19/24 1207

## 2024-03-19 NOTE — Progress Notes (Signed)
 Pt was accepted to [facility] 03/19/2024 Bed Assignment: L31   Address: [address] [place], [place], [place] [address]  -[facility] Fax: [phone]  Pt meets inpatient criteria per Cicero Crawley, NP   Attending Physician will be Dr. Romona Cobb  Report can be called to: [phone]  Care Team notified: [facility] Bevin Bucks, RN, Lady Pier, NP, Merlene Stark, RN

## 2024-03-19 NOTE — Tx Team (Signed)
 Initial Treatment Plan 03/19/2024 4:38 PM SHERILYNN DIEU HQI:[id number]    PATIENT STRESSORS: Substance abuse     PATIENT STRENGTHS: Communication skills  Religious Affiliation    PATIENT IDENTIFIED PROBLEMS:   I did say I would kill my sister.    My house is infested with roaches.               DISCHARGE CRITERIA:  Ability to meet basic life and health needs Adequate post-discharge living arrangements Improved stabilization in mood, thinking, and/or behavior Safe-care adequate arrangements made  PRELIMINARY DISCHARGE PLAN: Attend aftercare/continuing care group Return to previous work or school arrangements  PATIENT/FAMILY INVOLVEMENT: This treatment plan has been presented to and reviewed with the patient, Ashley [name]. The patient has been given the opportunity to ask questions and make suggestions.  Lestine Rathke, RN 03/19/2024, 4:38 PM

## 2024-03-19 NOTE — [facility]HH Suicide Risk Assessment (Signed)
 [facility] Admission Suicide Risk Assessment   Nursing information obtained from:  Patient Demographic factors:  Living alone Current Mental Status:  Thoughts of violence towards others Loss Factors:  Decline in physical health Historical Factors:  NA Risk Reduction Factors:  Sense of responsibility to family   Principal Problem: Schizophrenia (HCC) Diagnosis:  Principal Problem:   Schizophrenia (HCC)  Subjective Data: Please see H&P.  Continued Clinical Symptoms:  Alcohol Use Disorder Identification Test Final Score (AUDIT): 0 The Alcohol Use Disorders Identification Test, Guidelines for Use in Primary Care, Second Edition.  World Science writer [facility]). Score between 0-7:  no or low risk or alcohol related problems. Score between 8-15:  moderate risk of alcohol related problems. Score between 16-19:  high risk of alcohol related problems. Score 20 or above:  warrants further diagnostic evaluation for alcohol dependence and treatment.   CLINICAL FACTORS:   Bipolar Disorder:   Mixed State Alcohol/Substance Abuse/Dependencies Schizophrenia:   Depressive state Chronic Pain More than one psychiatric diagnosis Previous Psychiatric Diagnoses and Treatments   Musculoskeletal: Strength & Muscle Tone: within normal limits Gait & Station: normal Patient leans: N/A  Psychiatric Specialty Exam:  Presentation  General Appearance:  Appropriate for Environment  Eye Contact: Good  Speech: Clear and Coherent  Speech Volume: Normal  Handedness:No data recorded  Mood and Affect  Mood: Irritable  Affect: Congruent; Full Range   Thought Process  Thought Processes: Linear; Coherent  Descriptions of Associations:Intact  Orientation:Full (Time, Place and Person)  Thought Content:Logical  History of Schizophrenia/Schizoaffective disorder:Yes  Duration of Psychotic Symptoms:No data recorded Hallucinations:Hallucinations: None  Ideas of Reference:None  Suicidal  Thoughts:Suicidal Thoughts: No (Conditional suicidal thoughts leading to home situation)  Homicidal Thoughts:Homicidal Thoughts: No   Sensorium  Memory: Immediate Good; Recent Fair; Remote Fair  Judgment: Fair  Insight: Fair   Art therapist  Concentration: Fair  Attention Span: Fair  Recall: [employer] of Knowledge: Fair  Language: Fair   Psychomotor Activity  Psychomotor Activity: Psychomotor Activity: Normal   Assets  Assets: Communication Skills; Social Support; Physical Health; Vocational/Educational   Sleep  Sleep: Sleep: Fair    Physical Exam: Physical Exam ROS Blood pressure 127/83, pulse 88, temperature (!) 97.4 F (36.3 C), temperature source Temporal, resp. rate 18, height 5' 7 (1.702 m), weight 68.5 kg, SpO2 100%. Body mass index is 23.65 kg/m.   COGNITIVE FEATURES THAT CONTRIBUTE TO RISK:  None    SUICIDE RISK:   Minimal: No identifiable suicidal ideation.  Patients presenting with no risk factors but with morbid ruminations; may be classified as minimal risk based on the severity of the depressive symptoms  PLAN OF CARE: Admit to inpatient.  I certify that inpatient services furnished can reasonably be expected to improve the patient's condition.   Albert Huff, MD 03/19/2024, 5:00 PM

## 2024-03-19 NOTE — Plan of Care (Signed)
  Problem: Coping: Goal: Ability to verbalize frustrations and anger appropriately will improve Outcome: Progressing Goal: Ability to demonstrate self-control will improve Outcome: Progressing   Problem: Education: Goal: Emotional status will improve Outcome: Not Progressing Goal: Mental status will improve Outcome: Not Progressing

## 2024-03-19 NOTE — H&P (Signed)
 Ashley [name] is an 63 y.o. female.   Chief Complaint: Suicidal and homicidal thoughts HPI: 63 year old African-American female with past history of questionable bipolar disorder, questionable schizophrenia, anxiety, and past medical history of hypertension, hyperlipidemia, diabetes, cervical myelopathy, stroke presented to the hospital ED with reportedly having suicidal and homicidal thoughts.  Patient was seen in the ED and was transferred to our behavioral health unit for further treatment.  Patient on interview was alert and oriented x 4.  Able to engage well.  Patient reports that she is in the hospital because her house is infested with encroaches.  Reports there are several types of approaches in her house and this has been going on since 2019.  Reports that she has tried to reach out to her landlord but nobody is helping her.  She reports that changing apartment is stressful and wants help and that is why she came to the hospital as a Child psychotherapist here to get help her changing her apartment.  She reports that there has several roaches that even when she is sleep it crawls all over her.  Patient reports having suicidal thoughts in the context that her house is infested with roaches.  Reports that if there are no roaches in the house she would not be suicidal.  Reports if we can find some better place for her to stay she will be all okay.  Patient denies any homicidal thoughts to this writer even though that was reported in the ED.  There were some concerns that she had homicidal thoughts towards her sister.  Patient consistently denied several times during the interview that she does not want to harm herself or her sister.  Patient reports that she has been diagnosed with bipolar/schizophrenia during when she was around 63 years of age.  Patient denies any current auditory or visual hallucinations denied any paranoia.  Reports that she has never heard voices or seeing things or feeling paranoid.   Denies any thought insertion or thought broadcasting.  Patient reports that she has been hospitalized 4 times in the past.  3 times in [place]  and 1 time and [place]  in 1998 when she cut her wrist.  At that time patient reports that she was depressed.  Patient did report that she had used drugs and doing 1990s and has used cocaine at that time.  Reports that she has been clean but often use on a recreational basis if someone can give her cocaine.  She reports that the last use of cocaine was last week with her friend.  She reports smoking marijuana and weeping nicotine  on a regular basis.  Denies any other drugs including alcohol or benzos or PCP or any other drugs.  Patient did report having distorted self-image, impulsive and dangerous behaviors.  Reports having highly variable mood lasting from minutes to hours.  Patient reports having intense feeling of anger on at times.  Patient reports having splitting behaviors from 1 extremes to other during her relationship.  Reports that she always have intense and unstable relationship with friends family members.  Patient reports that she has not been hospitalized for the last several years and she has been taking her Seroquel .  Her Seroquel  was prescribed by her PCP Dr. Brent [name] who retired.  She reports that the new PCP wants her to follow up with psychiatry before she can give her Seroquel .  Patient reports that she is getting her Seroquel  from the ED providers.  Collateral from patient's daughter [name] phone  number [phone] does not know much about her mother's mental health condition.  Reports that she knows that her mother takes Seroquel  for sleep.  She has never seen her mother responding to internal stimuli.  She confirms that her mother mood swings happened from minutes to an hour, had been quite needy, attention-seeking behaviors, has a intense inappropriate anger.  Have unstable extremes in the relationship with friends and family  members.  Past psychiatric history- Psychiatric diagnosis-bipolar/schizophrenia/anxiety/depression 1 previous suicide attempt in 1998-patient tried to cut her wrist.  No other suicide attempt. No current outpatient psychiatrist. No current outpatient therapist. Patient reports that she gets her Seroquel  800 mg [400 mg twice daily] from her PCP and now from the ED. 3 previous inpatient hospitalization in [place] .  Patient was hospitalized in 1998 in [place]  when she cut her wrist in an attempt to kill herself.  Reports that she has been hospitalized once more which was in early 2000.  Since that time patient has not been hospitalized.  Patient reports that she has been on Seroquel  for more than 20 years.  Family history-patient denies any family history of any psychiatric issues or any suicide or any drug abuse in the family.  Social history-single, 4 grades-3 kids alive, [place], no guns, no legal issues, 12th grade education.  Drugs-patient reports constantly using marijuana and waving nicotine .  Patient occasionally uses cocaine on a recreational basis.  Last use of cocaine was last week.  Past Medical History:  Diagnosis Date   Anxiety    Arthritis    Bipolar 1 disorder (HCC)    Cervical myelopathy (HCC) 12/28/2020   Cervical spinal cord compression (HCC) 12/19/2020   Depression    Diabetes mellitus without complication (HCC)    Type II   GERD (gastroesophageal reflux disease)    Heart murmur    nothing to worry about   HNP (herniated nucleus pulposus) with myelopathy, cervical    HTN (hypertension)    not on medication   Pneumonia    Quadriplegia and quadriparesis (HCC)    Stroke (HCC) 11/2020   per patient did not have a stroke    Past Surgical History:  Procedure Laterality Date   ABDOMINAL HYSTERECTOMY     ANTERIOR CERVICAL DECOMP/DISCECTOMY FUSION N/A 12/25/2020   Procedure: CERVICAL THREE-FOUR ANTERIOR CERVICAL DECOMPRESSION/DISCECTOMY FUSION;  Surgeon:  Ashley Champion, MD;  Location: [facility] OR;  Service: Neurosurgery;  Laterality: N/A;  CERVICAL THREE-FOUR ANTERIOR CERVICAL DECOMPRESSION/DISCECTOMY FUSION    ANTERIOR CERVICAL DECOMP/DISCECTOMY FUSION N/A 04/13/2023   Procedure: C3-5 ANTERIOR CERVICAL DISCECTOMY AND FUSION (HEDRON);  Surgeon: Jodeen Munch, MD;  Location: [facility] ORS;  Service: Neurosurgery;  Laterality: N/A;   LUMBAR LAMINECTOMY/DECOMPRESSION MICRODISCECTOMY Left 04/07/2016   Procedure: Left Lumbar four-five Microdiskectomy;  Surgeon: Manya Sells, MD;  Location: [facility] NEURO ORS;  Service: Neurosurgery;  Laterality: Left;    Family History  Problem Relation Age of Onset   Healthy Mother    Diabetes Mellitus II Father    Breast cancer Sister    Diabetes Brother    Heart disease Neg Hx    Stroke Neg Hx    Kidney disease Neg Hx    Social History:  reports that she has been smoking cigarettes. She has a 39 pack-year smoking history. She has never used smokeless tobacco. She reports current drug use. Drug: Marijuana. She reports that she does not drink alcohol.  Allergies: No Known Allergies  Medications Prior to Admission  Medication Sig Dispense Refill   Blood Glucose Monitoring  Suppl (TRUE METRIX METER) w/Device KIT Use to check blood sugar up to 3 times daily. 1 kit 0   glipiZIDE  (GLUCOTROL  XL) 10 MG 24 hr tablet Take 1 tablet (10 mg total) by mouth 2 (two) times daily after a meal. 60 tablet 4   glucose blood (TRUE METRIX BLOOD GLUCOSE TEST) test strip Use to check blood sugar up to 3 times daily. 100 each 2   Insulin  Glargine (BASAGLAR  KWIKPEN) 100 UNIT/ML Inject 55 Units into the skin at bedtime. 9 mL 3   insulin  lispro (HUMALOG  KWIKPEN) 100 UNIT/ML KwikPen Inject 10 Units into the skin 3 (three) times daily. 30 mL 2   Insulin  Pen Needle (TECHLITE PEN NEEDLES) 32G X 4 MM MISC Use as directed. 100 each 3   QUEtiapine  (SEROQUEL ) 400 MG tablet Take 1 tablet (400 mg total) by mouth 2 (two) times daily. 60 tablet 0    rosuvastatin  (CRESTOR ) 40 MG tablet Take 1 tablet (40 mg total) by mouth once daily. 90 tablet 1   TRUEplus Lancets 28G MISC Use to check blood sugar up to 3 times daily. 100 each 3    Results for orders placed or performed during the hospital encounter of 03/19/24 (from the past 48 hours)  Glucose, capillary     Status: Abnormal   Collection Time: 03/19/24  4:11 PM  Result Value Ref Range   Glucose-Capillary 196 (H) 70 - 99 mg/dL    Comment: Glucose reference range applies only to samples taken after fasting for at least 8 hours.   No results found.  Review of Systems Review of Systems  All other systems reviewed and are negative.   Blood pressure 127/83, pulse 88, temperature (!) 97.4 F (36.3 C), temperature source Temporal, resp. rate 18, height 5' 7 (1.702 m), weight 68.5 kg, SpO2 100%. Physical Exam  Physical Exam Constitutional:      Appearance: Normal appearance.  HENT:     Head: Normocephalic and atraumatic.     Right Ear: External ear normal.     Left Ear: External ear normal.     Nose: Nose normal.     Mouth/Throat:     Mouth: Mucous membranes are moist.  Eyes:     Extraocular Movements: Extraocular movements intact.     Pupils: Pupils are equal, round, and reactive to light.  Cardiovascular:     Rate and Rhythm: Normal rate.  Pulmonary:     Effort: Pulmonary effort is normal.     Breath sounds: Normal breath sounds.  Abdominal:     Palpations: Abdomen is soft.  Musculoskeletal:        General: Normal range of motion.     Cervical back: Normal range of motion and neck supple.  Skin:    General: Skin is warm.  Neurological:     General: No focal deficit present.     Mental Status:  alert.    Musculoskeletal: Strength & Muscle Tone: within normal limits Gait & Station: normal Patient leans: N/A   Psychiatric Specialty Exam:   Presentation  General Appearance:  Appropriate for Environment   Eye Contact: Good   Speech: Clear and Coherent    Speech Volume: Normal   Handedness:No data recorded   Mood and Affect  Mood: Irritable   Affect: Congruent; Full Range     Thought Process  Thought Processes: Linear; Coherent   Descriptions of Associations:Intact   Orientation:Full (Time, Place and Person)   Thought Content:Logical   History of Schizophrenia/Schizoaffective disorder:Yes   Duration  of Psychotic Symptoms:No data recorded Hallucinations:Hallucinations: None   Ideas of Reference:None   Suicidal Thoughts:Suicidal Thoughts: No (Conditional suicidal thoughts leading to home situation)   Homicidal Thoughts:Homicidal Thoughts: No     Sensorium  Memory: Immediate Good; Recent Fair; Remote Fair   Judgment: Fair   Insight: Fair     Chartered certified accountant: Fair   Attention Span: Fair   Recall: [employer] of Knowledge: Fair   Language: Fair     Psychomotor Activity  Psychomotor Activity: Psychomotor Activity: Normal     Assets  Assets: Communication Skills; Social Support; Physical Health; Vocational/Educational     Sleep  Sleep: Sleep: Fair   Cranial Nerve Exam CNI - Olfactory:  Not Evaluated CNII - Optic:  Visaul Fields Intact CN III,IV,VI:  No Ptosis CN V- Trigeminal:  Not Evaluated CN VII- Facial:  Facial Symmetry CN VIII- Vestibulocochlear:  Hearing Grossly Intact CN IX,X- Glosspharyngeal,Vagus:  Normal Phonation CN XI - Accessory:  Ability to Shoulder Shrug CN XII - Hypoglossal:  Tongue Protrudes Midline  Assessment/Plan Assessment/Plan  Adjustment disorder with depressed and anxious mood. Psychosocial stressors. History of bipolar disorder-rule out borderline personality disorder.   1.    Safety and Monitoring:   -- Voluntary admission to inpatient psychiatric unit for safety, stabilization and treatment -- Daily contact with patient to assess and evaluate symptoms and progress in treatment -- Patient's case to be discussed in  multi-disciplinary team meeting -- Observation Level : q15 minute checks -- Vital signs:  q12 hours -- Precautions: suicide   2. Psychiatric Diagnoses and Treatment: Continue home medication Seroquel  400 mg twice daily.  Discussed with patient about risks of worsening diabetes and metabolic burden.  Patient expressed understanding but reports that she has been stable on this medication for more than 20 years.     --  The risks/benefits/side-effects/alternatives to this medication were discussed in detail with the patient and time was given for questions. The patient consents to medication trial. -- Metabolic profile and EKG monitoring obtained while on an atypical antipsychotic  (BMI: 0.00, QTC: 0.00, HbA1c: 0.00, Lipid panel: 0.00) -- Encouraged patient to participate in unit milieu and in scheduled group therapies -- Short Term Goals: Ability to identify changes in lifestyle to reduce recurrence of condition will improve, Ability to verbalize feelings will improve, Ability to disclose and discuss suicidal ideas, Ability to demonstrate self-control will improve, Ability to identify and develop effective coping behaviors will improve, Ability to maintain clinical measurements within normal limits will improve, Compliance with prescribed medications will improve, and Ability to identify triggers associated with substance abuse/mental health issues will improve -- Long Term Goals: Improvement in symptoms so as ready for discharge        3. Medical Issues Being Addressed:           Diabetes-continue glipizide  10 mg twice daily, insulin  aspart 10 units subcutaneous 3 times with meals, insulin  glargine 55 units subcutaneous at bedtime's.  Carb control diet.  Insulin  sliding scale.  Hyperlipidemia-Crestor  40 mg daily.                4. Discharge Planning:   -- Social work and case management to assist with discharge planning and identification of hospital follow-up needs prior to discharge --  Estimated LOS: 5-7 days -- Discharge Concerns: Need to establish a safety plan; Medication compliance and effectiveness -- Discharge Goals: Return home with outpatient referrals for mental health follow-up including medication management/psychotherapy   Albert Huff, MD 03/19/2024,  5:04 PM

## 2024-03-20 LAB — GLUCOSE, CAPILLARY
Glucose-Capillary: 182 mg/dL — ABNORMAL HIGH (ref 70–99)
Glucose-Capillary: 168 mg/dL — ABNORMAL HIGH (ref 70–99)
Glucose-Capillary: 271 mg/dL — ABNORMAL HIGH (ref 70–99)
Glucose-Capillary: 171 mg/dL — ABNORMAL HIGH (ref 70–99)

## 2024-03-20 MED ORDER — GLIPIZIDE ER 5 MG PO TB24
5.0000 mg | ORAL_TABLET | Freq: Two times a day (BID) | ORAL | Status: DC
  Administered 2024-03-20 – 2024-03-24 (×8): 5 mg via ORAL
  Filled 2024-03-20 (×9): qty 1

## 2024-03-20 MED ORDER — GLUCOSE 40 % PO GEL: 1.0000 | Freq: Once | ORAL | Status: DC | PRN

## 2024-03-20 MED ORDER — HYDROXYZINE HCL 50 MG PO TABS
50.0000 mg | ORAL_TABLET | Freq: Four times a day (QID) | ORAL | Status: DC | PRN
  Administered 2024-03-20 – 2024-03-24 (×12): 50 mg via ORAL
  Filled 2024-03-20 (×12): qty 1

## 2024-03-20 MED ORDER — INSULIN GLARGINE-YFGN 100 UNIT/ML ~~LOC~~ SOLN
25.0000 [IU] | Freq: Every day | SUBCUTANEOUS | Status: DC
  Administered 2024-03-20 – 2024-03-21 (×2): 25 [IU] via SUBCUTANEOUS
  Filled 2024-03-20 (×3): qty 0.25

## 2024-03-20 NOTE — Progress Notes (Signed)
 Alert and oriented. Upon approach, pt is loud, demanding and easily agitated about her animals at home alone. Support and encouragement provided.   At 2029, Blood sugar 29. recheck BS 48 at 2032. OJ offered and accepted. No s/s hypo/hyperglycemia noted. Rashaun, NP notified. N.O. Glucose gel once PRN.   Recheck CBG 76 at 2100. Po meds given as scheduled. Tol well. No c/o pain/discomfort noted. Q checks provided. Denies SI/HI/AVH.

## 2024-03-20 NOTE — Progress Notes (Signed)
 [facility] MD Progress Note  03/20/2024 12:18 PM Ashley [name]  MRN:  [id number]  63 year old African-American female with past history of questionable bipolar disorder, questionable schizophrenia, anxiety, and past medical history of hypertension, hyperlipidemia, diabetes, cervical myelopathy, stroke presented to the hospital ED with reportedly having suicidal and homicidal thoughts. Patient was seen in the ED and was transferred to our behavioral health unit for further treatment.   Patient on initial interview at the behavioral health unit denied any suicidal or homicidal thoughts or any auditory or visual hallucinations.  Reports that she is in the hospital because of the roaches and her living situation.  Subjective:  03/20/2024: Staff reports that the patient has expressed homicidal ideations towards her sister yesterday during their assessment.  Patient had denied any homicidal ideations towards anyone to this Clinical research associate.  Also staff reports that the patient blood sugar went low and they called the on-call doctor and got glucagon orders added to the as needed.  Patient on interview was quite irritated.  Kept talking about her neighbor.  Reports that the neighbor called someone as her dog was barking.  Now she does not know where her dog is.  She was stressed out.  Reports that while the neighbor is messing with her business.  Reports that she does not even know the name of the neighbor.  She was loud during the conversation and mood appeared to be very labile.  Patient reports that that is her normal self that she cannot control her impulses and it changed from minute to minute.  Patient was able to calm down with deep breathing exercises.  Coach patient with the coping skills of deep breathing to help her relax.  Patient reports that she has talked to her daughter who is going to take care of the dog and her cat and her rabbit.  Patient reports that she had homicidal ideations towards the neighbor but not to  worsen her sister or anybody else.  She reports that she does not even know which neighbor it is and have no plan.  Then patient said that she did not want to harm anyone including that neighbor.  She just wanted her cats, dogs and well taken care of.  Later patient again wanted to talk to this writer and reports that she feels anxious.  As needed hydroxyzine was added.  The patient had a good response with that.   Principal Problem: Schizophrenia (HCC) Diagnosis: Principal Problem:   Schizophrenia (HCC)  Past Medical History:  Past Medical History:  Diagnosis Date   Anxiety    Arthritis    Bipolar 1 disorder (HCC)    Cervical myelopathy (HCC) 12/28/2020   Cervical spinal cord compression (HCC) 12/19/2020   Depression    Diabetes mellitus without complication (HCC)    Type II   GERD (gastroesophageal reflux disease)    Heart murmur    nothing to worry about   HNP (herniated nucleus pulposus) with myelopathy, cervical    HTN (hypertension)    not on medication   Pneumonia    Quadriplegia and quadriparesis (HCC)    Stroke (HCC) 11/2020   per patient did not have a stroke    Past Surgical History:  Procedure Laterality Date   ABDOMINAL HYSTERECTOMY     ANTERIOR CERVICAL DECOMP/DISCECTOMY FUSION N/A 12/25/2020   Procedure: CERVICAL THREE-FOUR ANTERIOR CERVICAL DECOMPRESSION/DISCECTOMY FUSION;  Surgeon: Cannon Champion, MD;  Location: [facility] OR;  Service: Neurosurgery;  Laterality: N/A;  CERVICAL THREE-FOUR ANTERIOR CERVICAL DECOMPRESSION/DISCECTOMY FUSION  ANTERIOR CERVICAL DECOMP/DISCECTOMY FUSION N/A 04/13/2023   Procedure: C3-5 ANTERIOR CERVICAL DISCECTOMY AND FUSION (HEDRON);  Surgeon: Jodeen Munch, MD;  Location: [facility] ORS;  Service: Neurosurgery;  Laterality: N/A;   LUMBAR LAMINECTOMY/DECOMPRESSION MICRODISCECTOMY Left 04/07/2016   Procedure: Left Lumbar four-five Microdiskectomy;  Surgeon: Manya Sells, MD;  Location: [facility] OR[facility];  Service: Neurosurgery;  Laterality: Left;    Family History:  Family History  Problem Relation Age of Onset   Healthy Mother    Diabetes Mellitus II Father    Breast cancer Sister    Diabetes Brother    Heart disease Neg Hx    Stroke Neg Hx    Kidney disease Neg Hx     Social History:  Social History   Substance and Sexual Activity  Alcohol Use No     Social History   Substance and Sexual Activity  Drug Use Yes   Types: Marijuana   Comment: Cocaine -- 04/06/16- last time, smokes marijuana    Social History   Socioeconomic History   Marital status: Divorced    Spouse name: Not on file   Number of children: 3   Years of education: Not on file   Highest education level: High school graduate  Occupational History   Not on file  Tobacco Use   Smoking status: Every Day    Current packs/day: 1.00    Average packs/day: 1 pack/day for 39.0 years (39.0 ttl pk-yrs)    Types: Cigarettes   Smokeless tobacco: Never  Vaping Use   Vaping status: Never Used  Substance and Sexual Activity   Alcohol use: No   Drug use: Yes    Types: Marijuana    Comment: Cocaine -- 04/06/16- last time, smokes marijuana   Sexual activity: Yes    Birth control/protection: Surgical  Other Topics Concern   Not on file  Social History Narrative   Lives with daughter   Right Handed   Drinks 1 cup of coffee per day      Social Drivers of Health   Financial Resource Strain: Low Risk  (04/08/2023)   Overall Financial Resource Strain (CARDIA)    Difficulty of Paying Living Expenses: Not hard at all  Food Insecurity: No Food Insecurity (03/19/2024)   Hunger Vital Sign    Worried About Running Out of Food in the Last Year: Never true    Ran Out of Food in the Last Year: Never true  Transportation Needs: No Transportation Needs (03/19/2024)   PRAPARE - Administrator, Civil Service (Medical): No    Lack of Transportation (Non-Medical): No  Physical Activity: Inactive (04/08/2023)   Exercise Vital Sign    Days of Exercise per Week:  0 days    Minutes of Exercise per Session: 0 min  Stress: No Stress Concern Present (04/08/2023)   [employer] of Occupational Health - Occupational Stress Questionnaire    Feeling of Stress : Only a little  Social Connections: Moderately Isolated (04/08/2023)   Social Connection and Isolation Panel    Frequency of Communication with Friends and Family: More than three times a week    Frequency of Social Gatherings with Friends and Family: Three times a week    Attends Religious Services: More than 4 times per year    Active Member of Clubs or Organizations: No    Attends Banker Meetings: Never    Marital Status: Divorced   Additional Social History:  Sleep: Good Estimated Sleeping Duration (Last 24 Hours): 9.75-12.00 hours  Appetite:  Good  Current Medications: Current Facility-Administered Medications  Medication Dose Route Frequency Provider Last Rate Last Admin   acetaminophen  (TYLENOL ) tablet 650 mg  650 mg Oral Q6H PRN Weber, Kyra A, NP       alum & mag hydroxide-simeth (MAALOX/MYLANTA) 200-200-20 MG/5ML suspension 30 mL  30 mL Oral Q4H PRN Weber, Kyra A, NP       dextrose  (GLUTOSE) oral gel 40% (peds > 20kg and adults)  1 Tube Oral Once PRN Dixon, Letcher Rattler, NP       haloperidol (HALDOL) tablet 5 mg  5 mg Oral TID PRN Weber, Kyra A, NP       And   diphenhydrAMINE  (BENADRYL ) capsule 50 mg  50 mg Oral TID PRN Weber, Kyra A, NP       haloperidol lactate (HALDOL) injection 5 mg  5 mg Intramuscular TID PRN Weber, Kyra A, NP       And   diphenhydrAMINE  (BENADRYL ) injection 50 mg  50 mg Intramuscular TID PRN Weber, Kyra A, NP       And   LORazepam  (ATIVAN ) injection 2 mg  2 mg Intramuscular TID PRN Weber, Kyra A, NP       haloperidol lactate (HALDOL) injection 10 mg  10 mg Intramuscular TID PRN Weber, Kyra A, NP       And   diphenhydrAMINE  (BENADRYL ) injection 50 mg  50 mg Intramuscular TID PRN Weber, Kyra A, NP       And    LORazepam  (ATIVAN ) injection 2 mg  2 mg Intramuscular TID PRN Weber, Kyra A, NP       glipiZIDE  (GLUCOTROL  XL) 24 hr tablet 10 mg  10 mg Oral BID PC Weber, Kyra A, NP   10 mg at 03/20/24 0910   hydrOXYzine (ATARAX) tablet 50 mg  50 mg Oral Q6H PRN Joniqua Sidle, MD   50 mg at 03/20/24 1104   insulin  aspart (novoLOG ) injection 0-5 Units  0-5 Units Subcutaneous QHS Acacia Latorre, MD       insulin  aspart (novoLOG ) injection 0-9 Units  0-9 Units Subcutaneous TID WC Maddie Brazier, MD   2 Units at 03/20/24 1138   insulin  glargine-yfgn (SEMGLEE ) injection 55 Units  55 Units Subcutaneous QHS Weber, Kyra A, NP       magnesium  hydroxide (MILK OF MAGNESIA) suspension 30 mL  30 mL Oral Daily PRN Weber, Kyra A, NP       QUEtiapine  (SEROQUEL ) tablet 400 mg  400 mg Oral BID Weber, Kyra A, NP   400 mg at 03/20/24 0910   rosuvastatin  (CRESTOR ) tablet 40 mg  40 mg Oral Daily Weber, Kyra A, NP   40 mg at 03/20/24 0909    Lab Results:  Results for orders placed or performed during the hospital encounter of 03/19/24 (from the past 48 hours)  Glucose, capillary     Status: Abnormal   Collection Time: 03/19/24  4:11 PM  Result Value Ref Range   Glucose-Capillary 196 (H) 70 - 99 mg/dL    Comment: Glucose reference range applies only to samples taken after fasting for at least 8 hours.  Glucose, capillary     Status: Abnormal   Collection Time: 03/19/24  8:29 PM  Result Value Ref Range   Glucose-Capillary 29 (LL) 70 - 99 mg/dL    Comment: Glucose reference range applies only to samples taken after fasting for at least 8 hours.   Comment  1 Notify RN   Glucose, capillary     Status: Abnormal   Collection Time: 03/19/24  8:32 PM  Result Value Ref Range   Glucose-Capillary 48 (L) 70 - 99 mg/dL    Comment: Glucose reference range applies only to samples taken after fasting for at least 8 hours.  Glucose, capillary     Status: None   Collection Time: 03/19/24  9:02 PM  Result Value Ref Range    Glucose-Capillary 76 70 - 99 mg/dL    Comment: Glucose reference range applies only to samples taken after fasting for at least 8 hours.  Glucose, capillary     Status: Abnormal   Collection Time: 03/20/24  7:39 AM  Result Value Ref Range   Glucose-Capillary 182 (H) 70 - 99 mg/dL    Comment: Glucose reference range applies only to samples taken after fasting for at least 8 hours.  Glucose, capillary     Status: Abnormal   Collection Time: 03/20/24 11:31 AM  Result Value Ref Range   Glucose-Capillary 168 (H) 70 - 99 mg/dL    Comment: Glucose reference range applies only to samples taken after fasting for at least 8 hours.    Blood Alcohol level:  Lab Results  Component Value Date   [facility] <15 03/18/2024   ETH <5 05/17/2017    Metabolic Disorder Labs: Lab Results  Component Value Date   HGBA1C 12.5 (A) 01/27/2024   MPG 212 07/02/2016   MPG 126 (H) 05/01/2013   Lab Results  Component Value Date   PROLACTIN 1.8 (L) 06/06/2020   Lab Results  Component Value Date   CHOL 153 11/11/2022   TRIG 210 (H) 11/11/2022   HDL 41 11/11/2022   CHOLHDL 3.7 11/11/2022   VLDL NOT CALC 07/02/2016   LDLCALC 77 11/11/2022   LDLCALC 194 (H) 11/12/2020    Physical Findings: AIMS:  ,  ,  ,  ,  ,  ,   CIWA:    COWS:     Musculoskeletal: Strength & Muscle Tone: within normal limits Gait & Station: normal Patient leans: N/A  Psychiatric Specialty Exam:  Presentation  General Appearance:  Appropriate for Environment  Eye Contact: Good  Speech: Clear and Coherent  Speech Volume: Normal  Handedness:No data recorded  Mood and Affect  Mood: Irritable  Affect: Congruent; Full Range   Thought Process  Thought Processes: Linear; Coherent  Descriptions of Associations:Intact  Orientation:Full (Time, Place and Person)  Thought Content:Logical  History of Schizophrenia/Schizoaffective disorder:Yes  Duration of Psychotic Symptoms:No data  recorded Hallucinations:Hallucinations: None  Ideas of Reference:None  Suicidal Thoughts:Suicidal Thoughts: No (Conditional suicidal thoughts leading to home situation)  Homicidal Thoughts:Homicidal Thoughts: No   Sensorium  Memory: Immediate Good; Recent Fair; Remote Fair  Judgment: Fair  Insight: Fair   Art therapist  Concentration: Fair  Attention Span: Fair  Recall: [employer] of Knowledge: Fair  Language: Fair   Psychomotor Activity  Psychomotor Activity: Psychomotor Activity: Normal   Assets  Assets: Communication Skills; Social Support; Physical Health; Vocational/Educational   Sleep  Sleep: Sleep: Fair    Physical Exam: Physical Exam ROS Blood pressure 112/73, pulse 87, temperature (!) 97.5 F (36.4 C), resp. rate 18, height 5' 7 (1.702 m), weight 68.5 kg, SpO2 100%. Body mass index is 23.65 kg/m.   Treatment Plan Summary: Assessment/Plan Assessment/Plan   Adjustment disorder with depressed and anxious mood. Psychosocial stressors. History of bipolar disorder-rule out borderline personality disorder.     1.    Safety  and Monitoring:   -- Voluntary admission to inpatient psychiatric unit for safety, stabilization and treatment -- Daily contact with patient to assess and evaluate symptoms and progress in treatment -- Patient's case to be discussed in multi-disciplinary team meeting -- Observation Level : q15 minute checks -- Vital signs:  q12 hours -- Precautions: suicide   2. Psychiatric Diagnoses and Treatment: Continue home medication Seroquel  400 mg twice daily.  Discussed with patient about risks of worsening diabetes and metabolic burden.  Patient expressed understanding but reports that she has been stable on this medication for more than 20 years.     As needed hydroxyzine for anxiety.  --  The risks/benefits/side-effects/alternatives to this medication were discussed in detail with the patient and time was  given for questions. The patient consents to medication trial. -- Metabolic profile and EKG monitoring obtained while on an atypical antipsychotic  (BMI: 0.00, QTC: 0.00, HbA1c: 0.00, Lipid panel: 0.00) -- Encouraged patient to participate in unit milieu and in scheduled group therapies -- Short Term Goals: Ability to identify changes in lifestyle to reduce recurrence of condition will improve, Ability to verbalize feelings will improve, Ability to disclose and discuss suicidal ideas, Ability to demonstrate self-control will improve, Ability to identify and develop effective coping behaviors will improve, Ability to maintain clinical measurements within normal limits will improve, Compliance with prescribed medications will improve, and Ability to identify triggers associated with substance abuse/mental health issues will improve -- Long Term Goals: Improvement in symptoms so as ready for discharge        3. Medical Issues Being Addressed: Patient has not had episode of hypoglycemia.  We will discontinue aspart 10 units subcutaneous with meals.  We will continue insulin  glargine 55 units at bedtime.  Continue insulin  sliding scale.  We will reduce glipizide  to 5 mg twice daily. Diabetes consult ordered  Hyperlipidemia-Crestor  40 mg daily.   4. Discharge Planning:   -- Social work and case management to assist with discharge planning and identification of hospital follow-up needs prior to discharge -- Estimated LOS: 5-7 days -- Discharge Concerns: Need to establish a safety plan; Medication compliance and effectiveness -- Discharge Goals: Return home with outpatient referrals for mental health follow-up including medication management/psychotherapy    Albert Huff, MD 03/20/2024, 12:18 PM

## 2024-03-20 NOTE — Group Note (Signed)
 Date:  03/20/2024 Time:  11:25 AM  Group Topic/Focus:  Outside Rec/Music Therapy  The purpose of this group is to allow patients to go out and get air while doing outside activities also listening to soothing music     Participation Level:  Active  Participation Quality:  Appropriate  Affect:  Appropriate  Cognitive:  Appropriate  Insight: Appropriate  Engagement in Group:  Engaged  Modes of Intervention:  Activity  Additional Comments:    Linnell Richardson 03/20/2024, 11:25 AM

## 2024-03-20 NOTE — Progress Notes (Signed)
   03/19/24 2200  Psych Admission Type (Psych Patients Only)  Admission Status Voluntary  Psychosocial Assessment  Patient Complaints None  Eye Contact Fair  Facial Expression Animated  Affect Appropriate to circumstance  Speech Logical/coherent  Interaction Assertive  Motor Activity Other (Comment) (WNL)  Appearance/Hygiene In scrubs  Behavior Characteristics Agitated  Mood Anxious  Thought Process  Coherency WDL  Content Blaming others  Delusions None reported or observed  Perception WDL  Hallucination None reported or observed  Judgment Impaired  Confusion None  Danger to Self  Current suicidal ideation? Denies

## 2024-03-20 NOTE — Inpatient Diabetes Management (Signed)
 Inpatient Diabetes Program Recommendations  AACE/ADA: New Consensus Statement on Inpatient Glycemic Control (2015)  Target Ranges:  Prepandial:   less than 140 mg/dL      Peak postprandial:   less than 180 mg/dL (1-2 hours)      Critically ill patients:  140 - 180 mg/dL    Latest Reference Range & Units 01/27/24 11:09  HbA1c, POC (controlled diabetic range) 0.0 - 7.0 % 12.5 !  !: Data is abnormal  Latest Reference Range & Units 03/19/24 09:56 03/19/24 16:11 03/19/24 20:29 03/19/24 20:32 03/19/24 21:02  Glucose-Capillary 70 - 99 mg/dL 161 (H)  10 units Novolog   Glipizide  10 mg  196 (H)  12 units Novolog   Glipizide  10 mg  29 (LL) 48 (L) 76    Latest Reference Range & Units 03/20/24 07:39 03/20/24 11:31  Glucose-Capillary 70 - 99 mg/dL 096 (H)  2 units Novolog   168 (H)  2 units Novolog    (H): Data is abnormally high    Admit with:  63 year old African-American female with past history of questionable bipolar disorder, questionable schizophrenia, anxiety, and past medical history of hypertension, hyperlipidemia, diabetes, cervical myelopathy, stroke presented to the hospital ED with reportedly having suicidal and homicidal thoughts.   History: DM  Home DM Meds: Glipizide  10 mg BID       Basaglar  55 units at bedtime       Humalog  10 units TID with meals  Current Orders: Glipizide  5 mg BID      Semglee  55 units at bedtime      Novolog  Sensitive Correction Scale/ SSI (0-9 units) TID AC + HS    Note Hypoglycemia yest at 8pm Novolog  10 units TID with meals Stopped Glipizide  reduced to 5 mg BID Agree with these adjustments  Note that Semglee  insulin  was HELD last PM CBGs have been <200 so far today  MD- May consider reducing the Semglee  to 50% home dose for now and can titrate as needed  Reduce Semglee  to 25 units at bedtime    --Will follow patient during hospitalization--  Langston Pippins RN, MSN, CDCES Diabetes Coordinator Inpatient Glycemic  Control Team Team Pager: [phone] (8a-5p)

## 2024-03-20 NOTE — Plan of Care (Signed)

## 2024-03-20 NOTE — Group Note (Unsigned)
 Date:  03/21/2024 Time:  1:22 AM  Group Topic/Focus:  Wrap-Up Group:   The focus of this group is to help patients review their daily goal of treatment and discuss progress on daily workbooks.    Participation Level:  Active  Participation Quality:  Appropriate  Affect:  Appropriate  Cognitive:  Appropriate  Insight: Appropriate  Engagement in Group:  Engaged  Modes of Intervention:  Discussion  Additional Comments:    Rolland Cline 03/21/2024, 1:22 AM

## 2024-03-20 NOTE — [facility] Suicide Risk Assessment (Signed)
 [facility] INPATIENT:  Family/Significant Other Suicide Prevention Education  Suicide Prevention Education:  Patient Refusal for Family/Significant Other Suicide Prevention Education: The patient Ashley [name] has refused to provide written consent for family/significant other to be provided Family/Significant Other Suicide Prevention Education during admission and/or prior to discharge.  Physician notified.  Claudio Culver 03/20/2024, 3:35 PM

## 2024-03-20 NOTE — Progress Notes (Addendum)
 Patient is a voluntary admission to [facility] for HI toward her sister.  Patient had a female relative pick up the keys to her house today. Patient later heard yelling on the phone at someone for having access to her house.  Patient's main complaint today is anxiety for which she received an order for prn hydroxyzine.  When the SW attempted to speak with her today the patient came out demanding more hydroxyzine and became upset when advised she had to wait 3 more hours.  Instructed her to try and incorporate her coping skills to help herself for now.  Dr made aware and no additional orders placed.    Diabetic coordinator agreed with the cancellation of 10 units insulin  with meals and decrease of glipizide  to 5mg  bid.  Also recommended to decrease semglee  to 25 units at night.  Will continue to monitor patient.

## 2024-03-21 LAB — GLUCOSE, CAPILLARY
Glucose-Capillary: 236 mg/dL — ABNORMAL HIGH (ref 70–99)
Glucose-Capillary: 196 mg/dL — ABNORMAL HIGH (ref 70–99)
Glucose-Capillary: 214 mg/dL — ABNORMAL HIGH (ref 70–99)
Glucose-Capillary: 214 mg/dL — ABNORMAL HIGH (ref 70–99)

## 2024-03-21 NOTE — Plan of Care (Signed)
  Problem: Education: Goal: Ability to describe self-care measures that may prevent or decrease complications (Diabetes Survival Skills Education) will improve Outcome: Progressing Goal: Individualized Educational Video(s) Outcome: Progressing   Problem: Coping: Goal: Ability to adjust to condition or change in health will improve Outcome: Progressing   Problem: Fluid Volume: Goal: Ability to maintain a balanced intake and output will improve Outcome: Progressing   Problem: Health Behavior/Discharge Planning: Goal: Ability to identify and utilize available resources and services will improve Outcome: Progressing Goal: Ability to manage health-related needs will improve Outcome: Progressing   Problem: Metabolic: Goal: Ability to maintain appropriate glucose levels will improve Outcome: Progressing   Problem: Nutritional: Goal: Maintenance of adequate nutrition will improve Outcome: Progressing Goal: Progress toward achieving an optimal weight will improve Outcome: Progressing   Problem: Skin Integrity: Goal: Risk for impaired skin integrity will decrease Outcome: Progressing   Problem: Tissue Perfusion: Goal: Adequacy of tissue perfusion will improve Outcome: Progressing   Problem: Education: Goal: Knowledge of [place] [facility] Education information/materials will improve Outcome: Progressing Goal: Emotional status will improve Outcome: Progressing Goal: Mental status will improve Outcome: Progressing Goal: Verbalization of understanding the information provided will improve Outcome: Progressing   Problem: Activity: Goal: Interest or engagement in activities will improve Outcome: Progressing Goal: Sleeping patterns will improve Outcome: Progressing   Problem: Coping: Goal: Ability to verbalize frustrations and anger appropriately will improve Outcome: Progressing Goal: Ability to demonstrate self-control will improve Outcome: Progressing   Problem: Health  Behavior/Discharge Planning: Goal: Identification of resources available to assist in meeting health care needs will improve Outcome: Progressing Goal: Compliance with treatment plan for underlying cause of condition will improve Outcome: Progressing   Problem: Physical Regulation: Goal: Ability to maintain clinical measurements within normal limits will improve Outcome: Progressing   Problem: Safety: Goal: Periods of time without injury will increase Outcome: Progressing   
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
## 2024-03-21 NOTE — Plan of Care (Signed)
  Problem: Nutritional: Goal: Maintenance of adequate nutrition will improve Outcome: Progressing   Problem: Education: Goal: Mental status will improve Outcome: Progressing   Problem: Safety: Goal: Periods of time without injury will increase Outcome: Progressing

## 2024-03-21 NOTE — Progress Notes (Addendum)
 [facility] MD Progress Note  03/21/2024 6:16 PM Ashley [name]  MRN:  [id number]  63 year old African-American female with past history of questionable bipolar disorder, questionable schizophrenia, anxiety, and past medical history of hypertension, hyperlipidemia, diabetes, cervical myelopathy, stroke presented to the hospital ED with reportedly having suicidal and homicidal thoughts. Patient was seen in the ED and was transferred to our behavioral health unit for further treatment.   Patient on initial interview at the behavioral health unit denied any suicidal or homicidal thoughts or any auditory or visual hallucinations.  Reports that she is in the hospital because of the roaches and her living situation.  Subjective:    Patient was seen early in the morning and later during the treatment team round.  Patient on interview reports that she is not suicidal.  Denies any homicidal thoughts.  Denies any auditory visual hallucinations.  Did express her anger towards the neighbor who called  animal services who took her dog cat and rabbit out of her house.  Patient reports that she is here because she has roaches in her house.   Patient reported that she also wanted it written in the chart so that she can take it to the judge and show that this led to her hospitalization this time.  No side effects of the medication reported.    Patient did report having mood lability at times.   Patient feels marijuana helps in calming her down.  This Clinical research associate talked to her about other healthy coping skills.  Patient expressed understanding but reports that she meditates well when she is under the influence of marijuana.    No hypoglycemic episode noted.  Principal Problem: Schizophrenia (HCC) Diagnosis: Principal Problem:   Schizophrenia (HCC)  Past Medical History:  Past Medical History:  Diagnosis Date   Anxiety    Arthritis    Bipolar 1 disorder (HCC)    Cervical myelopathy (HCC) 12/28/2020   Cervical spinal cord  compression (HCC) 12/19/2020   Depression    Diabetes mellitus without complication (HCC)    Type II   GERD (gastroesophageal reflux disease)    Heart murmur    nothing to worry about   HNP (herniated nucleus pulposus) with myelopathy, cervical    HTN (hypertension)    not on medication   Pneumonia    Quadriplegia and quadriparesis (HCC)    Stroke (HCC) 11/2020   per patient did not have a stroke    Past Surgical History:  Procedure Laterality Date   ABDOMINAL HYSTERECTOMY     ANTERIOR CERVICAL DECOMP/DISCECTOMY FUSION N/A 12/25/2020   Procedure: CERVICAL THREE-FOUR ANTERIOR CERVICAL DECOMPRESSION/DISCECTOMY FUSION;  Surgeon: Cannon Champion, MD;  Location: [facility] OR;  Service: Neurosurgery;  Laterality: N/A;  CERVICAL THREE-FOUR ANTERIOR CERVICAL DECOMPRESSION/DISCECTOMY FUSION    ANTERIOR CERVICAL DECOMP/DISCECTOMY FUSION N/A 04/13/2023   Procedure: C3-5 ANTERIOR CERVICAL DISCECTOMY AND FUSION (HEDRON);  Surgeon: Jodeen Munch, MD;  Location: [facility] ORS;  Service: Neurosurgery;  Laterality: N/A;   LUMBAR LAMINECTOMY/DECOMPRESSION MICRODISCECTOMY Left 04/07/2016   Procedure: Left Lumbar four-five Microdiskectomy;  Surgeon: Manya Sells, MD;  Location: [facility] NEUR[facility] ORS;  Service: Neurosurgery;  Laterality: Left;   Family History:  Family History  Problem Relation Age of Onset   Healthy Mother    Diabetes Mellitus II Father    Breast cancer Sister    Diabetes Brother    Heart disease Neg Hx    Stroke Neg Hx    Kidney disease Neg Hx     Social History:  Social History  Substance and Sexual Activity  Alcohol Use No     Social History   Substance and Sexual Activity  Drug Use Yes   Types: Marijuana   Comment: Cocaine -- 04/06/16- last time, smokes marijuana    Social History   Socioeconomic History   Marital status: Divorced    Spouse name: Not on file   Number of children: 3   Years of education: Not on file   Highest education level: High school graduate   Occupational History   Not on file  Tobacco Use   Smoking status: Every Day    Current packs/day: 1.00    Average packs/day: 1 pack/day for 39.0 years (39.0 ttl pk-yrs)    Types: Cigarettes   Smokeless tobacco: Never  Vaping Use   Vaping status: Never Used  Substance and Sexual Activity   Alcohol use: No   Drug use: Yes    Types: Marijuana    Comment: Cocaine -- 04/06/16- last time, smokes marijuana   Sexual activity: Yes    Birth control/protection: Surgical  Other Topics Concern   Not on file  Social History Narrative   Lives with daughter   Right Handed   Drinks 1 cup of coffee per day      Social Drivers of Health   Financial Resource Strain: Low Risk  (04/08/2023)   Overall Financial Resource Strain (CARDIA)    Difficulty of Paying Living Expenses: Not hard at all  Food Insecurity: No Food Insecurity (03/19/2024)   Hunger Vital Sign    Worried About Running Out of Food in the Last Year: Never true    Ran Out of Food in the Last Year: Never true  Transportation Needs: No Transportation Needs (03/19/2024)   PRAPARE - Administrator, Civil Service (Medical): No    Lack of Transportation (Non-Medical): No  Physical Activity: Inactive (04/08/2023)   Exercise Vital Sign    Days of Exercise per Week: 0 days    Minutes of Exercise per Session: 0 min  Stress: No Stress Concern Present (04/08/2023)   [employer] of Occupational Health - Occupational Stress Questionnaire    Feeling of Stress : Only a little  Social Connections: Moderately Isolated (04/08/2023)   Social Connection and Isolation Panel    Frequency of Communication with Friends and Family: More than three times a week    Frequency of Social Gatherings with Friends and Family: Three times a week    Attends Religious Services: More than 4 times per year    Active Member of Clubs or Organizations: No    Attends Banker Meetings: Never    Marital Status: Divorced   Additional Social  History:                         Sleep: Good Estimated Sleeping Duration (Last 24 Hours): 8.00-9.25 hours  Appetite:  Good  Current Medications: Current Facility-Administered Medications  Medication Dose Route Frequency Provider Last Rate Last Admin   acetaminophen  (TYLENOL ) tablet 650 mg  650 mg Oral Q6H PRN Weber, Kyra A, NP       alum & mag hydroxide-simeth (MAALOX/MYLANTA) 200-200-20 MG/5ML suspension 30 mL  30 mL Oral Q4H PRN Weber, Kyra A, NP       dextrose  (GLUTOSE) oral gel 40% (peds > 20kg and adults)  1 Tube Oral Once PRN Al Alias, NP       haloperidol (HALDOL) tablet 5 mg  5 mg Oral TID  PRN Weber, Kyra A, NP       And   diphenhydrAMINE  (BENADRYL ) capsule 50 mg  50 mg Oral TID PRN Weber, Kyra A, NP       haloperidol lactate (HALDOL) injection 5 mg  5 mg Intramuscular TID PRN Weber, Kyra A, NP       And   diphenhydrAMINE  (BENADRYL ) injection 50 mg  50 mg Intramuscular TID PRN Weber, Kyra A, NP       And   LORazepam  (ATIVAN ) injection 2 mg  2 mg Intramuscular TID PRN Weber, Kyra A, NP       haloperidol lactate (HALDOL) injection 10 mg  10 mg Intramuscular TID PRN Weber, Kyra A, NP       And   diphenhydrAMINE  (BENADRYL ) injection 50 mg  50 mg Intramuscular TID PRN Weber, Kyra A, NP       And   LORazepam  (ATIVAN ) injection 2 mg  2 mg Intramuscular TID PRN Weber, Kyra A, NP       glipiZIDE  (GLUCOTROL  XL) 24 hr tablet 5 mg  5 mg Oral BID PC Blessen Kimbrough, MD   5 mg at 03/21/24 1727   hydrOXYzine (ATARAX) tablet 50 mg  50 mg Oral Q6H PRN Jalie Eiland, MD   50 mg at 03/21/24 1130   insulin  aspart (novoLOG ) injection 0-5 Units  0-5 Units Subcutaneous QHS Trusten Hume, MD       insulin  aspart (novoLOG ) injection 0-9 Units  0-9 Units Subcutaneous TID WC Deane Wattenbarger, MD   3 Units at 03/21/24 1727   insulin  glargine-yfgn (SEMGLEE ) injection 25 Units  25 Units Subcutaneous QHS Tomisha Reppucci, MD   25 Units at 03/20/24 2145    magnesium  hydroxide (MILK OF MAGNESIA) suspension 30 mL  30 mL Oral Daily PRN Weber, Kyra A, NP       QUEtiapine  (SEROQUEL ) tablet 400 mg  400 mg Oral BID Weber, Kyra A, NP   400 mg at 03/21/24 0944   rosuvastatin  (CRESTOR ) tablet 40 mg  40 mg Oral Daily Weber, Kyra A, NP   40 mg at 03/21/24 0944    Lab Results:  Results for orders placed or performed during the hospital encounter of 03/19/24 (from the past 48 hours)  Glucose, capillary     Status: Abnormal   Collection Time: 03/19/24  8:29 PM  Result Value Ref Range   Glucose-Capillary 29 (LL) 70 - 99 mg/dL    Comment: Glucose reference range applies only to samples taken after fasting for at least 8 hours.   Comment 1 Notify RN   Glucose, capillary     Status: Abnormal   Collection Time: 03/19/24  8:32 PM  Result Value Ref Range   Glucose-Capillary 48 (L) 70 - 99 mg/dL    Comment: Glucose reference range applies only to samples taken after fasting for at least 8 hours.  Glucose, capillary     Status: None   Collection Time: 03/19/24  9:02 PM  Result Value Ref Range   Glucose-Capillary 76 70 - 99 mg/dL    Comment: Glucose reference range applies only to samples taken after fasting for at least 8 hours.  Glucose, capillary     Status: Abnormal   Collection Time: 03/20/24  7:39 AM  Result Value Ref Range   Glucose-Capillary 182 (H) 70 - 99 mg/dL    Comment: Glucose reference range applies only to samples taken after fasting for at least 8 hours.  Glucose, capillary     Status: Abnormal  Collection Time: 03/20/24 11:31 AM  Result Value Ref Range   Glucose-Capillary 168 (H) 70 - 99 mg/dL    Comment: Glucose reference range applies only to samples taken after fasting for at least 8 hours.  Glucose, capillary     Status: Abnormal   Collection Time: 03/20/24  4:23 PM  Result Value Ref Range   Glucose-Capillary 271 (H) 70 - 99 mg/dL    Comment: Glucose reference range applies only to samples taken after fasting for at least 8 hours.   Glucose, capillary     Status: Abnormal   Collection Time: 03/20/24  8:08 PM  Result Value Ref Range   Glucose-Capillary 171 (H) 70 - 99 mg/dL    Comment: Glucose reference range applies only to samples taken after fasting for at least 8 hours.  Glucose, capillary     Status: Abnormal   Collection Time: 03/21/24  7:27 AM  Result Value Ref Range   Glucose-Capillary 236 (H) 70 - 99 mg/dL    Comment: Glucose reference range applies only to samples taken after fasting for at least 8 hours.  Glucose, capillary     Status: Abnormal   Collection Time: 03/21/24 11:45 AM  Result Value Ref Range   Glucose-Capillary 196 (H) 70 - 99 mg/dL    Comment: Glucose reference range applies only to samples taken after fasting for at least 8 hours.  Glucose, capillary     Status: Abnormal   Collection Time: 03/21/24  4:44 PM  Result Value Ref Range   Glucose-Capillary 214 (H) 70 - 99 mg/dL    Comment: Glucose reference range applies only to samples taken after fasting for at least 8 hours.    Blood Alcohol level:  Lab Results  Component Value Date   [facility] <15 03/18/2024   ETH <5 05/17/2017    Metabolic Disorder Labs: Lab Results  Component Value Date   HGBA1C 12.5 (A) 01/27/2024   MPG 212 07/02/2016   MPG 126 (H) 05/01/2013   Lab Results  Component Value Date   PROLACTIN 1.8 (L) 06/06/2020   Lab Results  Component Value Date   CHOL 153 11/11/2022   TRIG 210 (H) 11/11/2022   HDL 41 11/11/2022   CHOLHDL 3.7 11/11/2022   VLDL NOT CALC 07/02/2016   LDLCALC 77 11/11/2022   LDLCALC 194 (H) 11/12/2020    Physical Findings: AIMS:  ,  ,  ,  ,  ,  ,   CIWA:    COWS:     Musculoskeletal: Strength & Muscle Tone: within normal limits Gait & Station: normal Patient leans: N/A  Psychiatric Specialty Exam:  Presentation  General Appearance:  Appropriate for Environment  Eye Contact: Good  Speech: Clear and Coherent  Speech Volume: Normal  Handedness:No data recorded  Mood and  Affect  Mood: Irritable  Affect: Congruent; Full Range   Thought Process  Thought Processes: Linear; Coherent  Descriptions of Associations:Intact  Orientation:Full (Time, Place and Person)  Thought Content:Logical  History of Schizophrenia/Schizoaffective disorder:Yes  Duration of Psychotic Symptoms:No data recorded Hallucinations:No data recorded  Ideas of Reference:None  Suicidal Thoughts:No data recorded  Homicidal Thoughts:No data recorded   Sensorium  Memory: Immediate Good; Recent Fair; Remote Fair  Judgment: Fair  Insight: Fair   Art therapist  Concentration: Fair  Attention Span: Fair  Recall: [employer] of Knowledge: Fair  Language: Fair   Psychomotor Activity  Psychomotor Activity: No data recorded   Assets  Assets: Communication Skills; Social Support; Physical Health; Vocational/Educational  Sleep  Sleep: No data recorded    Physical Exam: Physical Exam ROS Blood pressure 117/66, pulse 87, temperature 97.9 F (36.6 C), resp. rate 20, height 5' 7 (1.702 m), weight 68.5 kg, SpO2 98%. Body mass index is 23.65 kg/m.   Treatment Plan Summary: Assessment/Plan Assessment/Plan   Adjustment disorder with depressed and anxious mood. Psychosocial stressors. History of bipolar disorder-rule out borderline personality disorder.     1.    Safety and Monitoring:   -- Voluntary admission to inpatient psychiatric unit for safety, stabilization and treatment -- Daily contact with patient to assess and evaluate symptoms and progress in treatment -- Patient's case to be discussed in multi-disciplinary team meeting -- Observation Level : q15 minute checks -- Vital signs:  q12 hours -- Precautions: suicide   2. Psychiatric Diagnoses and Treatment: Continue home medication Seroquel  400 mg twice daily.  Discussed with patient about risks of worsening diabetes and metabolic burden.  Patient expressed understanding but  reports that she has been stable on this medication for more than 20 years.     As needed hydroxyzine for anxiety.  --  The risks/benefits/side-effects/alternatives to this medication were discussed in detail with the patient and time was given for questions. The patient consents to medication trial. -- Metabolic profile and EKG monitoring obtained while on an atypical antipsychotic  (BMI: 0.00, QTC: 0.00, HbA1c: 0.00, Lipid panel: 0.00) -- Encouraged patient to participate in unit milieu and in scheduled group therapies -- Short Term Goals: Ability to identify changes in lifestyle to reduce recurrence of condition will improve, Ability to verbalize feelings will improve, Ability to disclose and discuss suicidal ideas, Ability to demonstrate self-control will improve, Ability to identify and develop effective coping behaviors will improve, Ability to maintain clinical measurements within normal limits will improve, Compliance with prescribed medications will improve, and Ability to identify triggers associated with substance abuse/mental health issues will improve -- Long Term Goals: Improvement in symptoms so as ready for discharge        3. Medical Issues Being Addressed: Patient has not had episode of hypoglycemia.  We will discontinue aspart 10 units subcutaneous with meals.   Continue insulin  glargine 25 units q.h.s.. Continue insulin  sliding scale.  We will reduce glipizide  to 5 mg twice daily.   Appreciate diabetic consult recommendation.  Hyperlipidemia-Crestor  40 mg daily.   4. Discharge Planning:   -- Social work and case management to assist with discharge planning and identification of hospital follow-up needs prior to discharge -- Estimated LOS: 5-7 days -- Discharge Concerns: Need to establish a safety plan; Medication compliance and effectiveness -- Discharge Goals: Return home with outpatient referrals for mental health follow-up including medication management/psychotherapy     Albert Huff, MD 03/21/2024, 6:16 PM

## 2024-03-21 NOTE — Group Note (Signed)
 Date:  03/21/2024 Time:  10:55 AM  Group Topic/Focus:  Fresh air Therapy Outdoors    Participation Level:  Active  Participation Quality:  Appropriate  Affect:  Appropriate  Cognitive:  Appropriate  Insight: Appropriate  Engagement in Group:  Engaged  Modes of Intervention:  Activity  Additional Comments:  none  Merton Abts 03/21/2024, 10:55 AM

## 2024-03-21 NOTE — Group Note (Signed)
 Recreation Therapy Group Note   Group Topic:Other  Group Date: 03/21/2024 Start Time: 1400 End Time: 1445 Facilitators: Deatrice Factor, LRT, CTRS Location: Courtyard  Group Description: Music Reminisce. LRT encouraged patients to think of their favorite song(s) that reminded them of a positive memory or time in their life. LRT encouraged patient to talk about that memory aloud to the group. LRT played the song through a speaker for all to hear. LRT and patients discussed how thinking of a positive memory or time in their life can be used as a coping skill in everyday life post discharge.   Goal Area(s) Addressed: Patient will increase verbal communication by conversing with peers. Patient will contribute to group discussion with minimal prompting. Patient will reminisce a positive memory or moment in their life.    Affect/Mood: Appropriate   Participation Level: Minimal    Clinical Observations/Individualized Feedback: Ellerie came to group with 5 minutes remaining.  Plan: Continue to engage patient in RT group sessions 2-3x/week.   Deatrice Factor, LRT, CTRS 03/21/2024 4:52 PM

## 2024-03-21 NOTE — Plan of Care (Signed)
 Patient alert and oriented x4. Denies SI, HI, AVH and pain. Scheduled and PRN medications administered per MAR. Support and encouragement provided.  Routine safety checks conducted every 15 minutes.  Patient informed to notify staff with problems or concerns. No adverse drug reactions noted. Patient verbally contracts for safety at this time. Patient interacts well with others on the unit.  Patient remains safe at this time.  Problem: Education: Goal: Ability to describe self-care measures that may prevent or decrease complications (Diabetes Survival Skills Education) will improve Outcome: Progressing Goal: Individualized Educational Video(s) Outcome: Progressing   Problem: Coping: Goal: Ability to adjust to condition or change in health will improve Outcome: Progressing   Problem: Education: Goal: Knowledge of [facility] Education information/materials will improve Outcome: Progressing Goal: Emotional status will improve Outcome: Progressing Goal: Mental status will improve Outcome: Progressing Goal: Verbalization of understanding the information provided will improve Outcome: Progressing   Problem: Safety: Goal: Periods of time without injury will increase Outcome: Progressing

## 2024-03-21 NOTE — Group Note (Signed)
 Date:  03/21/2024 Time:  8:47 PM  Group Topic/Focus:  Recovery Goals:   The focus of this group is to identify appropriate goals for recovery and establish a plan to achieve them.    Participation Level:  Active  Participation Quality:  Appropriate  Affect:  Appropriate  Cognitive:  Alert  Insight: Appropriate  Engagement in Group:  Engaged  Modes of Intervention:  Discussion  Additional Comments:    Ashley [name] 03/21/2024, 8:47 PM

## 2024-03-21 NOTE — Progress Notes (Signed)
 Patient is pleasant and cooperative.  Endorses anxiety about the whereabouts of her animals.  Denies SI/HI and AVH.  Denies depression.  Denies pain.  Compliant with scheduled medications. PRN medication given for anxiety as ordered.  15 min checks in place for safety.  Patient is present in the milieu. Appropriate interaction with peers.

## 2024-03-21 NOTE — Progress Notes (Signed)
   03/21/24 2100  Psych Admission Type (Psych Patients Only)  Admission Status Voluntary  Psychosocial Assessment  Patient Complaints Anxiety  Eye Contact Fair  Facial Expression Animated  Affect Appropriate to circumstance  Speech Logical/coherent  Interaction Assertive  Motor Activity Restless  Appearance/Hygiene Unremarkable;In scrubs  Behavior Characteristics Cooperative;Appropriate to situation  Mood Anxious  Thought Process  Coherency WDL  Content Blaming others  Delusions None reported or observed  Perception WDL  Hallucination None reported or observed  Judgment Impaired  Confusion None  Danger to Self  Current suicidal ideation? Denies  Self-Injurious Behavior No self-injurious ideation or behavior indicators observed or expressed   Agreement Not to Harm Self Yes  Description of Agreement verbal  Danger to Others  Danger to Others None reported or observed  Danger to Others Abnormal  Harmful Behavior to others No threats or harm toward other people

## 2024-03-21 NOTE — Progress Notes (Signed)
   03/20/24 2100  Psych Admission Type (Psych Patients Only)  Admission Status Voluntary  Psychosocial Assessment  Patient Complaints Anxiety  Eye Contact Fair  Facial Expression Animated  Affect Appropriate to circumstance  Speech Logical/coherent  Interaction Assertive  Motor Activity Restless  Appearance/Hygiene Unremarkable  Behavior Characteristics Cooperative  Mood Anxious  Aggressive Behavior  Effect No apparent injury  Thought Process  Coherency WDL  Content Blaming others  Delusions None reported or observed  Perception WDL  Hallucination None reported or observed  Judgment Impaired  Confusion None  Danger to Self  Current suicidal ideation? Denies  Self-Injurious Behavior No self-injurious ideation or behavior indicators observed or expressed   Agreement Not to Harm Self Yes  Description of Agreement Verbal  Danger to Others  Danger to Others None reported or observed  Danger to Others Abnormal  Harmful Behavior to others No threats or harm toward other people  Destructive Behavior No threats or harm toward property

## 2024-03-21 NOTE — BH IP Treatment Plan (Signed)
 Interdisciplinary Treatment and Diagnostic Plan Update  03/21/2024 Time of Session: 11:45 AM  Ashley [name] MRN: [id number]  Principal Diagnosis: Schizophrenia (HCC)  Secondary Diagnoses: Principal Problem:   Schizophrenia (HCC)   Current Medications:  Current Facility-Administered Medications  Medication Dose Route Frequency Provider Last Rate Last Admin   acetaminophen  (TYLENOL ) tablet 650 mg  650 mg Oral Q6H PRN Weber, Kyra A, NP       alum & mag hydroxide-simeth (MAALOX/MYLANTA) 200-200-20 MG/5ML suspension 30 mL  30 mL Oral Q4H PRN Weber, Kyra A, NP       dextrose  (GLUTOSE) oral gel 40% (peds > 20kg and adults)  1 Tube Oral Once PRN Dixon, Rashaun M, NP       haloperidol (HALDOL) tablet 5 mg  5 mg Oral TID PRN Weber, Kyra A, NP       And   diphenhydrAMINE  (BENADRYL ) capsule 50 mg  50 mg Oral TID PRN Weber, Kyra A, NP       haloperidol lactate (HALDOL) injection 5 mg  5 mg Intramuscular TID PRN Weber, Kyra A, NP       And   diphenhydrAMINE  (BENADRYL ) injection 50 mg  50 mg Intramuscular TID PRN Weber, Kyra A, NP       And   LORazepam  (ATIVAN ) injection 2 mg  2 mg Intramuscular TID PRN Weber, Kyra A, NP       haloperidol lactate (HALDOL) injection 10 mg  10 mg Intramuscular TID PRN Weber, Kyra A, NP       And   diphenhydrAMINE  (BENADRYL ) injection 50 mg  50 mg Intramuscular TID PRN Weber, Kyra A, NP       And   LORazepam  (ATIVAN ) injection 2 mg  2 mg Intramuscular TID PRN Weber, Kyra A, NP       glipiZIDE  (GLUCOTROL  XL) 24 hr tablet 5 mg  5 mg Oral BID PC Shrivastava, Aryendra, MD   5 mg at 03/21/24 0944   hydrOXYzine (ATARAX) tablet 50 mg  50 mg Oral Q6H PRN Shrivastava, Aryendra, MD   50 mg at 03/21/24 1130   insulin  aspart (novoLOG ) injection 0-5 Units  0-5 Units Subcutaneous QHS Shrivastava, Aryendra, MD       insulin  aspart (novoLOG ) injection 0-9 Units  0-9 Units Subcutaneous TID WC Shrivastava, Aryendra, MD   2 Units at 03/21/24 1222   insulin  glargine-yfgn (SEMGLEE )  injection 25 Units  25 Units Subcutaneous QHS Shrivastava, Aryendra, MD   25 Units at 03/20/24 2145   magnesium  hydroxide (MILK OF MAGNESIA) suspension 30 mL  30 mL Oral Daily PRN Weber, Kyra A, NP       QUEtiapine  (SEROQUEL ) tablet 400 mg  400 mg Oral BID Weber, Kyra A, NP   400 mg at 03/21/24 0944   rosuvastatin  (CRESTOR ) tablet 40 mg  40 mg Oral Daily Weber, Kyra A, NP   40 mg at 03/21/24 0944   PTA Medications: Medications Prior to Admission  Medication Sig Dispense Refill Last Dose/Taking   Blood Glucose Monitoring Suppl (TRUE METRIX METER) w/Device KIT Use to check blood sugar up to 3 times daily. 1 kit 0    glipiZIDE  (GLUCOTROL  XL) 10 MG 24 hr tablet Take 1 tablet (10 mg total) by mouth 2 (two) times daily after a meal. 60 tablet 4    glucose blood (TRUE METRIX BLOOD GLUCOSE TEST) test strip Use to check blood sugar up to 3 times daily. 100 each 2    Insulin  Glargine (BASAGLAR  KWIKPEN) 100 UNIT/ML Inject 55  Units into the skin at bedtime. 9 mL 3    insulin  lispro (HUMALOG  KWIKPEN) 100 UNIT/ML KwikPen Inject 10 Units into the skin 3 (three) times daily. 30 mL 2    Insulin  Pen Needle (TECHLITE PEN NEEDLES) 32G X 4 MM MISC Use as directed. 100 each 3    QUEtiapine  (SEROQUEL ) 400 MG tablet Take 1 tablet (400 mg total) by mouth 2 (two) times daily. 60 tablet 0    rosuvastatin  (CRESTOR ) 40 MG tablet Take 1 tablet (40 mg total) by mouth once daily. 90 tablet 1    TRUEplus Lancets 28G MISC Use to check blood sugar up to 3 times daily. 100 each 3     Patient Stressors: Substance abuse    Patient Strengths: Manufacturing systems engineer  Religious Affiliation   Treatment Modalities: Medication Management, Group therapy, Case management,  1 to 1 session with clinician, Psychoeducation, Recreational therapy.   Physician Treatment Plan for Primary Diagnosis: Schizophrenia (HCC) Long Term Goal(s):     Short Term Goals:    Medication Management: Evaluate patient's response, side effects, and  tolerance of medication regimen.  Therapeutic Interventions: 1 to 1 sessions, Unit Group sessions and Medication administration.  Evaluation of Outcomes: Progressing  Physician Treatment Plan for Secondary Diagnosis: Principal Problem:   Schizophrenia (HCC)  Long Term Goal(s):     Short Term Goals:       Medication Management: Evaluate patient's response, side effects, and tolerance of medication regimen.  Therapeutic Interventions: 1 to 1 sessions, Unit Group sessions and Medication administration.  Evaluation of Outcomes: Progressing   RN Treatment Plan for Primary Diagnosis: Schizophrenia (HCC) Long Term Goal(s): Knowledge of disease and therapeutic regimen to maintain health will improve  Short Term Goals: Ability to remain free from injury will improve, Ability to verbalize frustration and anger appropriately will improve, Ability to demonstrate self-control, Ability to participate in decision making will improve, Ability to verbalize feelings will improve, Ability to disclose and discuss suicidal ideas, Ability to identify and develop effective coping behaviors will improve, and Compliance with prescribed medications will improve  Medication Management: RN will administer medications as ordered by provider, will assess and evaluate patient's response and provide education to patient for prescribed medication. RN will report any adverse and/or side effects to prescribing provider.  Therapeutic Interventions: 1 on 1 counseling sessions, Psychoeducation, Medication administration, Evaluate responses to treatment, Monitor vital signs and CBGs as ordered, Perform/monitor CIWA, COWS, AIMS and Fall Risk screenings as ordered, Perform wound care treatments as ordered.  Evaluation of Outcomes: Progressing   LCSW Treatment Plan for Primary Diagnosis: Schizophrenia (HCC) Long Term Goal(s): Safe transition to appropriate next level of care at discharge, Engage patient in therapeutic group  addressing interpersonal concerns.  Short Term Goals: Engage patient in aftercare planning with referrals and resources, Increase social support, Increase ability to appropriately verbalize feelings, Increase emotional regulation, Facilitate acceptance of mental health diagnosis and concerns, Facilitate patient progression through stages of change regarding substance use diagnoses and concerns, Identify triggers associated with mental health/substance abuse issues, and Increase skills for wellness and recovery  Therapeutic Interventions: Assess for all discharge needs, 1 to 1 time with Social worker, Explore available resources and support systems, Assess for adequacy in community support network, Educate family and significant other(s) on suicide prevention, Complete Psychosocial Assessment, Interpersonal group therapy.  Evaluation of Outcomes: Progressing   Progress in Treatment: Attending groups: Yes. and No. Participating in groups: Yes. and No. Taking medication as prescribed: Yes. Toleration medication: Yes. Family/Significant other  contact made: No, will contact:  CSW will contact if given permission  Patient understands diagnosis: Yes. Discussing patient identified problems/goals with staff: Yes. Medical problems stabilized or resolved: Yes. Denies suicidal/homicidal ideation: Yes. Issues/concerns per patient self-inventory: No. Other: None   New problem(s) identified: No, Describe:  None identified   New Short Term/Long Term Goal(s): elimination of symptoms of psychosis, medication management for mood stabilization; elimination of SI thoughts; development of comprehensive mental wellness plan.   Patient Goals:  To get better, strong, so I can move away from where I'm living at  Discharge Plan or Barriers: CSW will assist with appropriate discharge planning   Reason for Continuation of Hospitalization: Anxiety Depression Medication stabilization  Estimated Length of Stay:  1 to 7 days  Last 3 [place] Suicide Severity Risk Score: Flowsheet Row Admission (Current) from 03/19/2024 in [facility] ED from 03/18/2024 in [facility] Emergency Department at [facility] ED from 02/23/2024 in [facility] Emergency Department at [facility]  C-SSRS RISK CATEGORY Low Risk Low Risk No Risk    Last [facility] 2/9 Scores:    01/27/2024   11:06 AM 04/08/2023   11:42 AM 01/13/2023   11:10 AM  Depression screen PHQ 2/9  Decreased Interest 3 1 1   Down, Depressed, Hopeless 0 0 0  PHQ - 2 Score 3 1 1   Altered sleeping 0 3 3  Tired, decreased energy 3 0 1  Change in appetite 0 3 3  Feeling bad or failure about yourself  0 0 0  Trouble concentrating 3 3 3   Moving slowly or fidgety/restless 0 0 0  Suicidal thoughts 0 0 0  PHQ-9 Score 9 10 11     Scribe for Treatment Team: Claudio Culver, Milinda Allen 03/21/2024 12:45 PM

## 2024-03-22 LAB — GLUCOSE, CAPILLARY
Glucose-Capillary: 178 mg/dL — ABNORMAL HIGH (ref 70–99)
Glucose-Capillary: 200 mg/dL — ABNORMAL HIGH (ref 70–99)
Glucose-Capillary: 303 mg/dL — ABNORMAL HIGH (ref 70–99)
Glucose-Capillary: 210 mg/dL — ABNORMAL HIGH (ref 70–99)
Glucose-Capillary: 153 mg/dL — ABNORMAL HIGH (ref 70–99)
Glucose-Capillary: 233 mg/dL — ABNORMAL HIGH (ref 70–99)

## 2024-03-22 LAB — LIPID PANEL
Cholesterol: 187 mg/dL (ref 0–200)
HDL: 60 mg/dL (ref >40)
LDL Cholesterol: 96 mg/dL (ref 0–99)
Total CHOL/HDL Ratio: 3.1 ratio
Triglycerides: 155 mg/dL — ABNORMAL HIGH (ref <150)
VLDL: 31 mg/dL (ref 0–40)

## 2024-03-22 MED ORDER — INSULIN GLARGINE-YFGN 100 UNIT/ML ~~LOC~~ SOLN
27.0000 [IU] | Freq: Every day | SUBCUTANEOUS | Status: DC
  Administered 2024-03-22: 27 [IU] via SUBCUTANEOUS
  Filled 2024-03-22: qty 0.27

## 2024-03-22 NOTE — Group Note (Signed)
 Date:  03/22/2024 Time:  10:10 PM  Group Topic/Focus:  Wrap-Up Group:   The focus of this group is to help patients review their daily goal of treatment and discuss progress on daily workbooks.    Participation Level:  Did Not Attend  Participation Quality:     Affect:     Cognitive:     Insight: None  Engagement in Group:  None  Modes of Intervention:     Additional Comments:    Rolland Cline 03/22/2024, 10:10 PM

## 2024-03-22 NOTE — Plan of Care (Signed)
  Problem: Education: Goal: Ability to describe self-care measures that may prevent or decrease complications (Diabetes Survival Skills Education) will improve Outcome: Progressing Goal: Individualized Educational Video(s) Outcome: Progressing   Problem: Coping: Goal: Ability to adjust to condition or change in health will improve Outcome: Progressing   Problem: Fluid Volume: Goal: Ability to maintain a balanced intake and output will improve Outcome: Progressing   Problem: Health Behavior/Discharge Planning: Goal: Ability to identify and utilize available resources and services will improve Outcome: Progressing Goal: Ability to manage health-related needs will improve Outcome: Progressing   Problem: Metabolic: Goal: Ability to maintain appropriate glucose levels will improve Outcome: Progressing   Problem: Nutritional: Goal: Maintenance of adequate nutrition will improve Outcome: Progressing Goal: Progress toward achieving an optimal weight will improve Outcome: Progressing   Problem: Skin Integrity: Goal: Risk for impaired skin integrity will decrease Outcome: Progressing   Problem: Tissue Perfusion: Goal: Adequacy of tissue perfusion will improve Outcome: Progressing   Problem: Education: Goal: Knowledge of [place] [facility] Education information/materials will improve Outcome: Progressing Goal: Emotional status will improve Outcome: Progressing Goal: Mental status will improve Outcome: Progressing Goal: Verbalization of understanding the information provided will improve Outcome: Progressing   Problem: Activity: Goal: Interest or engagement in activities will improve Outcome: Progressing Goal: Sleeping patterns will improve Outcome: Progressing   Problem: Coping: Goal: Ability to verbalize frustrations and anger appropriately will improve Outcome: Progressing Goal: Ability to demonstrate self-control will improve Outcome: Progressing   Problem: Health  Behavior/Discharge Planning: Goal: Identification of resources available to assist in meeting health care needs will improve Outcome: Progressing Goal: Compliance with treatment plan for underlying cause of condition will improve Outcome: Progressing   Problem: Physical Regulation: Goal: Ability to maintain clinical measurements within normal limits will improve Outcome: Progressing   Problem: Safety: Goal: Periods of time without injury will increase Outcome: Progressing   

## 2024-03-22 NOTE — Plan of Care (Signed)
   Problem: Coping: Goal: Ability to verbalize frustrations and anger appropriately will improve Outcome: Progressing Goal: Ability to demonstrate self-control will improve Outcome: Progressing   Problem: Safety: Goal: Periods of time without injury will increase Outcome: Progressing

## 2024-03-22 NOTE — Progress Notes (Addendum)
   03/22/24 2000  Psych Admission Type (Psych Patients Only)  Admission Status Voluntary  Psychosocial Assessment  Patient Complaints Anxiety  Eye Contact Fair  Facial Expression Animated  Affect Appropriate to circumstance  Speech Logical/coherent  Interaction Assertive  Motor Activity Slow  Appearance/Hygiene Unremarkable;In scrubs  Behavior Characteristics Cooperative;Appropriate to situation  Mood Anxious;Pleasant  Thought Process  Coherency WDL  Content WDL  Delusions None reported or observed  Perception WDL  Hallucination None reported or observed  Judgment Impaired  Confusion None  Danger to Self  Current suicidal ideation? Denies  Self-Injurious Behavior No self-injurious ideation or behavior indicators observed or expressed   Agreement Not to Harm Self Yes  Description of Agreement verbal  Danger to Others  Danger to Others None reported or observed  Danger to Others Abnormal  Harmful Behavior to others No threats or harm toward other people   Pt reports I been feeling bad all day after eating the food. My daughter is also upsetting me. My daughter is saying that she is going to take my pets to the shelter. Pt expressing that she wants to take her landlord to court. Pt denies SI/HI/AVH. Pt woke up at 0200 reporting that she could not sleep and required prn Atarax. Pt states my mind just keeps going and going. Pt did not attend group reports she heard them but, realized that she missed group. Provided pt with emotional support. Overnight pt required prn Atarax x2. POC.

## 2024-03-22 NOTE — Group Note (Signed)
 Recreation Therapy Group Note   Group Topic:Other  Group Date: 03/22/2024 Start Time: 1400 End Time: 1450 Facilitators: Deatrice Factor, LRT, CTRS Location: Dayroom  Activity Description/Intervention: Therapeutic Drumming. Patients with peers and staff were given the opportunity to engage in a leader facilitated [employer] [facility] Empowerment Drumming Circle with staff from the [employer], in partnership with [employer]. Teaching laboratory technician and trained [employer], Kathlyne Parchment leading with LRT observing and documenting intervention and pt response. This evidenced-based practice targets 7 areas of health and wellbeing in the human experience including: stress-reduction, exercise, self-expression, camaraderie/support, nurturing, spirituality, and music-making (leisure).    Goal Area(s) Addresses:  Patient will engage in pro-social way in music group.  Patient will follow directions of drum leader on the first prompt. Patient will demonstrate no behavioral issues during group.  Patient will identify if a reduction in stress level occurs as a result of participation in therapeutic drum circle.     Affect/Mood: N/A   Participation Level: Did not attend    Clinical Observations/Individualized Feedback: Patient did not attend group.   Plan: Continue to engage patient in RT group sessions 2-3x/week.   Deatrice Factor, LRT, CTRS 03/22/2024 5:19 PM

## 2024-03-22 NOTE — Progress Notes (Signed)
 [facility] MD Progress Note  03/22/2024 1:41 PM Ashley [name]  MRN:  [id number]  63 year old African-American female with past history of questionable bipolar disorder, questionable schizophrenia, anxiety, and past medical history of hypertension, hyperlipidemia, diabetes, cervical myelopathy, stroke presented to the hospital ED with reportedly having suicidal and homicidal thoughts. Patient was seen in the ED and was transferred to our behavioral health unit for further treatment.   Patient on initial interview at the behavioral health unit denied any suicidal or homicidal thoughts or any auditory or visual hallucinations.  Reports that she is in the hospital because of the roaches and her living situation.  Subjective:      Nurses reported that the patient was not able to sleep well yesterday night.  But no agitation no aggression noted.  Able to engage well.  Patient on interview reports that she was anxious because she was worried about  her pet animals not being at home.  Reports that her bunny died adding stress to her.  Patient continues to deny any suicidal homicidal ideations or any auditory visual hallucinations.  Reports that she wants to Bobbye Burrow the landlord for entering her house without her permission.  Chart reviewed no hypoglycemic episode noted.    Patient was again later seen in the afternoon as nurse reports that patient is reporting having abdominal pain.   On examination patient has an pain around periumbilical area.  No nausea no vomiting, no diarrhea no constipation.  Patient reports that the pain is not that severe.   Patient reports that she does not want to do any further investigation at this time and wants to give 1 day and if it persist  will  evaluate further.   Principal Problem: Schizophrenia (HCC) Diagnosis: Principal Problem:   Schizophrenia (HCC)  Past Medical History:  Past Medical History:  Diagnosis Date   Anxiety    Arthritis    Bipolar 1 disorder (HCC)     Cervical myelopathy (HCC) 12/28/2020   Cervical spinal cord compression (HCC) 12/19/2020   Depression    Diabetes mellitus without complication (HCC)    Type II   GERD (gastroesophageal reflux disease)    Heart murmur    nothing to worry about   HNP (herniated nucleus pulposus) with myelopathy, cervical    HTN (hypertension)    not on medication   Pneumonia    Quadriplegia and quadriparesis (HCC)    Stroke (HCC) 11/2020   per patient did not have a stroke    Past Surgical History:  Procedure Laterality Date   ABDOMINAL HYSTERECTOMY     ANTERIOR CERVICAL DECOMP/DISCECTOMY FUSION N/A 12/25/2020   Procedure: CERVICAL THREE-FOUR ANTERIOR CERVICAL DECOMPRESSION/DISCECTOMY FUSION;  Surgeon: Cannon Champion, MD;  Location: [facility] OR;  Service: Neurosurgery;  Laterality: N/A;  CERVICAL THREE-FOUR ANTERIOR CERVICAL DECOMPRESSION/DISCECTOMY FUSION    ANTERIOR CERVICAL DECOMP/DISCECTOMY FUSION N/A 04/13/2023   Procedure: C3-5 ANTERIOR CERVICAL DISCECTOMY AND FUSION (HEDRON);  Surgeon: Jodeen Munch, MD;  Location: [facility] ORS;  Service: Neurosurgery;  Laterality: N/A;   LUMBAR LAMINECTOMY/DECOMPRESSION MICRODISCECTOMY Left 04/07/2016   Procedure: Left Lumbar four-five Microdiskectomy;  Surgeon: Manya Sells, MD;  Location: [facility]UR[facility] ORS;  Service: Neurosurgery;  Laterality: Left;   Family History:  Family History  Problem Relation Age of Onset   Healthy Mother    Diabetes Mellitus II Father    Breast cancer Sister    Diabetes Brother    Heart disease Neg Hx    Stroke Neg Hx    Kidney disease Neg Hx  Social History:  Social History   Substance and Sexual Activity  Alcohol Use No     Social History   Substance and Sexual Activity  Drug Use Yes   Types: Marijuana   Comment: Cocaine -- 04/06/16- last time, smokes marijuana    Social History   Socioeconomic History   Marital status: Divorced    Spouse name: Not on file   Number of children: 3   Years of education: Not on file    Highest education level: High school graduate  Occupational History   Not on file  Tobacco Use   Smoking status: Every Day    Current packs/day: 1.00    Average packs/day: 1 pack/day for 39.0 years (39.0 ttl pk-yrs)    Types: Cigarettes   Smokeless tobacco: Never  Vaping Use   Vaping status: Never Used  Substance and Sexual Activity   Alcohol use: No   Drug use: Yes    Types: Marijuana    Comment: Cocaine -- 04/06/16- last time, smokes marijuana   Sexual activity: Yes    Birth control/protection: Surgical  Other Topics Concern   Not on file  Social History Narrative   Lives with daughter   Right Handed   Drinks 1 cup of coffee per day      Social Drivers of Health   Financial Resource Strain: Low Risk  (04/08/2023)   Overall Financial Resource Strain (CARDIA)    Difficulty of Paying Living Expenses: Not hard at all  Food Insecurity: No Food Insecurity (03/19/2024)   Hunger Vital Sign    Worried About Running Out of Food in the Last Year: Never true    Ran Out of Food in the Last Year: Never true  Transportation Needs: No Transportation Needs (03/19/2024)   PRAPARE - Administrator, Civil Service (Medical): No    Lack of Transportation (Non-Medical): No  Physical Activity: Inactive (04/08/2023)   Exercise Vital Sign    Days of Exercise per Week: 0 days    Minutes of Exercise per Session: 0 min  Stress: No Stress Concern Present (04/08/2023)   [employer] of Occupational Health - Occupational Stress Questionnaire    Feeling of Stress : Only a little  Social Connections: Moderately Isolated (04/08/2023)   Social Connection and Isolation Panel    Frequency of Communication with Friends and Family: More than three times a week    Frequency of Social Gatherings with Friends and Family: Three times a week    Attends Religious Services: More than 4 times per year    Active Member of Clubs or Organizations: No    Attends Banker Meetings: Never     Marital Status: Divorced   Additional Social History:                         Sleep: Good Estimated Sleeping Duration (Last 24 Hours): 8.00-10.50 hours  Appetite:  Good  Current Medications: Current Facility-Administered Medications  Medication Dose Route Frequency Provider Last Rate Last Admin   acetaminophen  (TYLENOL ) tablet 650 mg  650 mg Oral Q6H PRN Weber, Kyra A, NP       alum & mag hydroxide-simeth (MAALOX/MYLANTA) 200-200-20 MG/5ML suspension 30 mL  30 mL Oral Q4H PRN Weber, Kyra A, NP       dextrose  (GLUTOSE) oral gel 40% (peds > 20kg and adults)  1 Tube Oral Once PRN Al Alias, NP       haloperidol (HALDOL) tablet  5 mg  5 mg Oral TID PRN Weber, Kyra A, NP       And   diphenhydrAMINE  (BENADRYL ) capsule 50 mg  50 mg Oral TID PRN Weber, Kyra A, NP       haloperidol lactate (HALDOL) injection 5 mg  5 mg Intramuscular TID PRN Weber, Kyra A, NP       And   diphenhydrAMINE  (BENADRYL ) injection 50 mg  50 mg Intramuscular TID PRN Weber, Kyra A, NP       And   LORazepam  (ATIVAN ) injection 2 mg  2 mg Intramuscular TID PRN Weber, Kyra A, NP       haloperidol lactate (HALDOL) injection 10 mg  10 mg Intramuscular TID PRN Weber, Kyra A, NP       And   diphenhydrAMINE  (BENADRYL ) injection 50 mg  50 mg Intramuscular TID PRN Weber, Kyra A, NP       And   LORazepam  (ATIVAN ) injection 2 mg  2 mg Intramuscular TID PRN Weber, Kyra A, NP       glipiZIDE  (GLUCOTROL  XL) 24 hr tablet 5 mg  5 mg Oral BID PC Patryce Depriest, MD   5 mg at 03/22/24 0814   hydrOXYzine (ATARAX) tablet 50 mg  50 mg Oral Q6H PRN Shamari Trostel, MD   50 mg at 03/22/24 0804   insulin  aspart (novoLOG ) injection 0-5 Units  0-5 Units Subcutaneous QHS Kaelynn Igo, MD   2 Units at 03/21/24 2110   insulin  aspart (novoLOG ) injection 0-9 Units  0-9 Units Subcutaneous TID WC Jiayi Lengacher, MD   7 Units at 03/22/24 1114   insulin  glargine-yfgn (SEMGLEE ) injection 27 Units  27 Units  Subcutaneous QHS Ailis Rigaud, MD       magnesium  hydroxide (MILK OF MAGNESIA) suspension 30 mL  30 mL Oral Daily PRN Weber, Kyra A, NP       QUEtiapine  (SEROQUEL ) tablet 400 mg  400 mg Oral BID Weber, Kyra A, NP   400 mg at 03/22/24 4098   rosuvastatin  (CRESTOR ) tablet 40 mg  40 mg Oral Daily Weber, Kyra A, NP   40 mg at 03/22/24 0813    Lab Results:  Results for orders placed or performed during the hospital encounter of 03/19/24 (from the past 48 hours)  Glucose, capillary     Status: Abnormal   Collection Time: 03/20/24  4:23 PM  Result Value Ref Range   Glucose-Capillary 271 (H) 70 - 99 mg/dL    Comment: Glucose reference range applies only to samples taken after fasting for at least 8 hours.  Glucose, capillary     Status: Abnormal   Collection Time: 03/20/24  8:08 PM  Result Value Ref Range   Glucose-Capillary 171 (H) 70 - 99 mg/dL    Comment: Glucose reference range applies only to samples taken after fasting for at least 8 hours.  Glucose, capillary     Status: Abnormal   Collection Time: 03/21/24  7:27 AM  Result Value Ref Range   Glucose-Capillary 236 (H) 70 - 99 mg/dL    Comment: Glucose reference range applies only to samples taken after fasting for at least 8 hours.  Glucose, capillary     Status: Abnormal   Collection Time: 03/21/24 11:45 AM  Result Value Ref Range   Glucose-Capillary 196 (H) 70 - 99 mg/dL    Comment: Glucose reference range applies only to samples taken after fasting for at least 8 hours.  Glucose, capillary     Status: Abnormal  Collection Time: 03/21/24  4:44 PM  Result Value Ref Range   Glucose-Capillary 214 (H) 70 - 99 mg/dL    Comment: Glucose reference range applies only to samples taken after fasting for at least 8 hours.  Glucose, capillary     Status: Abnormal   Collection Time: 03/21/24  7:57 PM  Result Value Ref Range   Glucose-Capillary 214 (H) 70 - 99 mg/dL    Comment: Glucose reference range applies only to samples taken  after fasting for at least 8 hours.  Glucose, capillary     Status: Abnormal   Collection Time: 03/22/24  6:06 AM  Result Value Ref Range   Glucose-Capillary 178 (H) 70 - 99 mg/dL    Comment: Glucose reference range applies only to samples taken after fasting for at least 8 hours.  Lipid panel     Status: Abnormal   Collection Time: 03/22/24  6:57 AM  Result Value Ref Range   Cholesterol 187 0 - 200 mg/dL   Triglycerides 147 (H) <150 mg/dL   HDL 60 >82 mg/dL   Total CHOL/HDL Ratio 3.1 RATIO   VLDL 31 0 - 40 mg/dL   LDL Cholesterol 96 0 - 99 mg/dL    Comment:        Total Cholesterol/HDL:CHD Risk Coronary Heart Disease Risk Table                     Men   Women  1/2 Average Risk   3.4   3.3  Average Risk       5.0   4.4  2 X Average Risk   9.6   7.1  3 X Average Risk  23.4   11.0        Use the calculated Patient Ratio above and the CHD Risk Table to determine the patient's CHD Risk.        ATP III CLASSIFICATION (LDL):  <100     mg/dL   Optimal  956-213  mg/dL   Near or Above                    Optimal  130-159  mg/dL   Borderline  086-578  mg/dL   High  >469     mg/dL   Very High Performed at [facility], [address] [place], [place] [address]   Glucose, capillary     Status: Abnormal   Collection Time: 03/22/24  7:30 AM  Result Value Ref Range   Glucose-Capillary 200 (H) 70 - 99 mg/dL    Comment: Glucose reference range applies only to samples taken after fasting for at least 8 hours.  Glucose, capillary     Status: Abnormal   Collection Time: 03/22/24 11:09 AM  Result Value Ref Range   Glucose-Capillary 303 (H) 70 - 99 mg/dL    Comment: Glucose reference range applies only to samples taken after fasting for at least 8 hours.  Glucose, capillary     Status: Abnormal   Collection Time: 03/22/24  1:36 PM  Result Value Ref Range   Glucose-Capillary 210 (H) 70 - 99 mg/dL    Comment: Glucose reference range applies only to samples taken after fasting for  at least 8 hours.    Blood Alcohol level:  Lab Results  Component Value Date   [facility] <15 03/18/2024   ETH <5 05/17/2017    Metabolic Disorder Labs: Lab Results  Component Value Date   HGBA1C 12.5 (A) 01/27/2024   MPG  212 07/02/2016   MPG 126 (H) 05/01/2013   Lab Results  Component Value Date   PROLACTIN 1.8 (L) 06/06/2020   Lab Results  Component Value Date   CHOL 187 03/22/2024   TRIG 155 (H) 03/22/2024   HDL 60 03/22/2024   CHOLHDL 3.1 03/22/2024   VLDL 31 03/22/2024   LDLCALC 96 03/22/2024   LDLCALC 77 11/11/2022    Physical Findings: AIMS:  ,  ,  ,  ,  ,  ,   CIWA:    COWS:     Musculoskeletal: Strength & Muscle Tone: within normal limits Gait & Station: normal Patient leans: N/A  Psychiatric Specialty Exam:  Presentation  General Appearance:  Appropriate for Environment  Eye Contact: Good  Speech: Clear and Coherent  Speech Volume: Normal  Handedness:No data recorded  Mood and Affect  Mood: Irritable  Affect: Congruent; Full Range   Thought Process  Thought Processes: Linear; Coherent  Descriptions of Associations:Intact  Orientation:Full (Time, Place and Person)  Thought Content:Logical  History of Schizophrenia/Schizoaffective disorder:Yes  Duration of Psychotic Symptoms:No data recorded Hallucinations:No data recorded  Ideas of Reference:None  Suicidal Thoughts:No data recorded  Homicidal Thoughts:No data recorded   Sensorium  Memory: Immediate Good; Recent Fair; Remote Fair  Judgment: Fair  Insight: Fair   Art therapist  Concentration: Fair  Attention Span: Fair  Recall: [employer] of Knowledge: Fair  Language: Fair   Psychomotor Activity  Psychomotor Activity: No data recorded   Assets  Assets: Communication Skills; Social Support; Physical Health; Vocational/Educational   Sleep  Sleep: No data recorded    Physical Exam: Physical Exam ROS Blood pressure 125/80, pulse  98, temperature (!) 97.3 F (36.3 C), resp. rate 18, height 5' 7 (1.702 m), weight 68.5 kg, SpO2 100%. Body mass index is 23.65 kg/m.   Treatment Plan Summary: Assessment/Plan Assessment/Plan   Adjustment disorder with depressed and anxious mood. Psychosocial stressors. History of bipolar disorder-rule out borderline personality disorder.     1.    Safety and Monitoring:   -- Voluntary admission to inpatient psychiatric unit for safety, stabilization and treatment -- Daily contact with patient to assess and evaluate symptoms and progress in treatment -- Patient's case to be discussed in multi-disciplinary team meeting -- Observation Level : q15 minute checks -- Vital signs:  q12 hours -- Precautions: suicide   2. Psychiatric Diagnoses and Treatment: Continue home medication Seroquel  400 mg twice daily.  Discussed with patient about risks of worsening diabetes and metabolic burden.  Patient expressed understanding but reports that she has been stable on this medication for more than 20 years.     As needed hydroxyzine for anxiety.  --  The risks/benefits/side-effects/alternatives to this medication were discussed in detail with the patient and time was given for questions. The patient consents to medication trial. -- Metabolic profile and EKG monitoring obtained while on an atypical antipsychotic  (BMI: 0.00, QTC: 0.00, HbA1c: 0.00, Lipid panel: 0.00) -- Encouraged patient to participate in unit milieu and in scheduled group therapies -- Short Term Goals: Ability to identify changes in lifestyle to reduce recurrence of condition will improve, Ability to verbalize feelings will improve, Ability to disclose and discuss suicidal ideas, Ability to demonstrate self-control will improve, Ability to identify and develop effective coping behaviors will improve, Ability to maintain clinical measurements within normal limits will improve, Compliance with prescribed medications will improve, and  Ability to identify triggers associated with substance abuse/mental health issues will improve -- Long Term Goals: Improvement  in symptoms so as ready for discharge        3. Medical Issues Being Addressed: Patient has not had episode of hypoglycemia.  We will discontinue aspart 10 units subcutaneous with meals.  Increase insulin  glargine to 27 units q.h.s.... Continue insulin  sliding scale.  We will reduce glipizide  to 5 mg twice daily.   Appreciate diabetic consult recommendation.  Hyperlipidemia-Crestor  40 mg daily.   4. Discharge Planning:   -- Social work and case management to assist with discharge planning and identification of hospital follow-up needs prior to discharge -- Estimated LOS: 5-7 days -- Discharge Concerns: Need to establish a safety plan; Medication compliance and effectiveness -- Discharge Goals: Return home with outpatient referrals for mental health follow-up including medication management/psychotherapy    Albert Huff, MD 03/22/2024, 1:41 PM

## 2024-03-22 NOTE — Progress Notes (Signed)
 Patient is pleasant and cooperative.  Endorses anxiety.  Denies SI/HI and AVH.  Denies depression. Denies pain.  Reports poor sleep.  Compliant with scheduled medications.  PRN medication for anxiety given as ordered.  15 min checks in place for safety.  Patient returned to room after breakfast to rest.  Present for meals and groups.   Patient states she is not feeling well (diaphoretic, abdominal pain and pressure).  CBG 303.  VS WNL.  Dr. Laneta Pintos on unit to assess patient.

## 2024-03-22 NOTE — Group Note (Signed)
 Date:  03/22/2024 Time:  11:56 AM  Group Topic/Focus:  Recovery Goals:   The focus of this group is to identify appropriate goals for recovery and establish a plan to achieve them.    Participation Level:  Did Not Attend  Ashley [name] 03/22/2024, 11:56 AM

## 2024-03-22 NOTE — Inpatient Diabetes Management (Signed)
 Inpatient Diabetes Program Recommendations  AACE/ADA: New Consensus Statement on Inpatient Glycemic Control   Target Ranges:  Prepandial:   less than 140 mg/dL      Peak postprandial:   less than 180 mg/dL (1-2 hours)      Critically ill patients:  140 - 180 mg/dL    Latest Reference Range & Units 03/21/24 07:27 03/21/24 11:45 03/21/24 16:44 03/21/24 19:57 03/22/24 06:06 03/22/24 07:30  Glucose-Capillary 70 - 99 mg/dL 161 (H) 096 (H) 045 (H) 214 (H) 178 (H) 200 (H)   Review of Glycemic Control  Diabetes history: DM2 Outpatient Diabetes medications: Basaglar  55 units at bedtime, Humalog  10 units TID with meals, Glipizide  10 mg BID Current orders for Inpatient glycemic control: Semglee  25 units at bedtime, Novolog  0-9 units TID with meals, Novolog  0-5 units at bedtime, Glipizide  XL 5 mg BID  Inpatient Diabetes Program Recommendations:    Insulin : Please consider increasing Semglee  to 27 units at bedtime.  Thanks, Beacher Limerick, RN, MSN, CDCES Diabetes Coordinator Inpatient Diabetes Program [phone] (Team Pager from 8am to 5pm)

## 2024-03-22 NOTE — Progress Notes (Signed)
 Patient requested her morning medications be given early so she could rest.

## 2024-03-22 NOTE — Group Note (Signed)
 LCSW Group Therapy Note  Group Date: 03/22/2024 Start Time: 1330 End Time: 1400   Type of Therapy and Topic:  Group Therapy - Healthy vs Unhealthy Coping Skills  Participation Level:  Did Not Attend   Description of Group The focus of this group was to determine what unhealthy coping techniques typically are used by group members and what healthy coping techniques would be helpful in coping with various problems. Patients were guided in becoming aware of the differences between healthy and unhealthy coping techniques. Patients were asked to identify 2-3 healthy coping skills they would like to learn to use more effectively.  Therapeutic Goals Patients learned that coping is what human beings do all day long to deal with various situations in their lives Patients defined and discussed healthy vs unhealthy coping techniques Patients identified their preferred coping techniques and identified whether these were healthy or unhealthy Patients determined 2-3 healthy coping skills they would like to become more familiar with and use more often. Patients provided support and ideas to each other   Summary of Patient Progress:X   Therapeutic Modalities Cognitive Behavioral Therapy Motivational Interviewing  Claudio Culver, [place] 03/22/2024  2:57 PM

## 2024-03-23 DIAGNOSIS — F209 Schizophrenia, unspecified: Secondary | ICD-10-CM

## 2024-03-23 LAB — GLUCOSE, CAPILLARY
Glucose-Capillary: 258 mg/dL — ABNORMAL HIGH (ref 70–99)
Glucose-Capillary: 239 mg/dL — ABNORMAL HIGH (ref 70–99)
Glucose-Capillary: 187 mg/dL — ABNORMAL HIGH (ref 70–99)
Glucose-Capillary: 248 mg/dL — ABNORMAL HIGH (ref 70–99)

## 2024-03-23 MED ORDER — INSULIN GLARGINE-YFGN 100 UNIT/ML ~~LOC~~ SOLN
29.0000 [IU] | Freq: Every day | SUBCUTANEOUS | Status: DC
  Administered 2024-03-23: 29 [IU] via SUBCUTANEOUS
  Filled 2024-03-23: qty 0.29

## 2024-03-23 MED ORDER — INSULIN ASPART 100 UNIT/ML IJ SOLN
3.0000 [IU] | Freq: Three times a day (TID) | INTRAMUSCULAR | Status: DC
  Administered 2024-03-23 – 2024-03-24 (×3): 3 [IU] via SUBCUTANEOUS
  Filled 2024-03-23: qty 1

## 2024-03-23 NOTE — BHH Counselor (Signed)
 CSW met with pt because pt wanted to discuss discharge.   Pt reports she wants a letter that states that the reason she was hospitalized was due to the roaches as she reports she would like to sue her slum landlords and take them to court.   CSW informs pt that the only kind of letter CSW can provide will include the dates of admission and discharge and admitting diagnosis. CSW also explained that the courts will have to request medical records formally should pt sue her landlord.   CSW asked pt about follow-up. Upon assessment pt reported she did not want follow-up. CSW provided psychoeducation around medication compliance.   Pt agreed to be scheduled for a medication management appointment so that she can continue her scheduled medication.   Pt also requests housing resources from CSW.   CSW will print and give to pt at discharge.   Derrill Flirt, MSW, [place] 03/23/2024 4:07 PM

## 2024-03-23 NOTE — Progress Notes (Signed)
 [facility] MD Progress Note  03/23/2024 11:28 AM Ashley [name]  MRN:  [id number]  63 year old African-American female with past history of questionable bipolar disorder, questionable schizophrenia, anxiety, and past medical history of hypertension, hyperlipidemia, diabetes, cervical myelopathy, stroke presented to the hospital ED with reportedly having suicidal and homicidal thoughts. Patient was seen in the ED and was transferred to our behavioral health unit for further treatment.   Patient on initial interview at the behavioral health unit denied any suicidal or homicidal thoughts or any auditory or visual hallucinations.  Reports that she is in the hospital because of the roaches and her living situation.  Subjective:      Chart is reviewed discussed the case with the treatment team.  Patient reports that she is ready to go home and wanted to talk to the social worker.  When asked about the circumstances of her admission she informs the provider that there were roaches in her house and she got very upset.  She becomes angry talking about somebody who try to steal money from her.  She did acknowledge having suicidal ideation when she came into the hospital.  Today she denies SI/HI/intensity/plan.  She denies auditory/visual hallucinations.  She wanted to talk to the social worker before discharge is set.  Principal Problem: Schizophrenia (HCC) Diagnosis: Principal Problem:   Schizophrenia (HCC)  Past Medical History:  Past Medical History:  Diagnosis Date   Anxiety    Arthritis    Bipolar 1 disorder (HCC)    Cervical myelopathy (HCC) 12/28/2020   Cervical spinal cord compression (HCC) 12/19/2020   Depression    Diabetes mellitus without complication (HCC)    Type II   GERD (gastroesophageal reflux disease)    Heart murmur    nothing to worry about   HNP (herniated nucleus pulposus) with myelopathy, cervical    HTN (hypertension)    not on medication   Pneumonia    Quadriplegia and  quadriparesis (HCC)    Stroke (HCC) 11/2020   per patient did not have a stroke    Past Surgical History:  Procedure Laterality Date   ABDOMINAL HYSTERECTOMY     ANTERIOR CERVICAL DECOMP/DISCECTOMY FUSION N/A 12/25/2020   Procedure: CERVICAL THREE-FOUR ANTERIOR CERVICAL DECOMPRESSION/DISCECTOMY FUSION;  Surgeon: Cannon Champion, MD;  Location: [facility] OR;  Service: Neurosurgery;  Laterality: N/A;  CERVICAL THREE-FOUR ANTERIOR CERVICAL DECOMPRESSION/DISCECTOMY FUSION    ANTERIOR CERVICAL DECOMP/DISCECTOMY FUSION N/A 04/13/2023   Procedure: C3-5 ANTERIOR CERVICAL DISCECTOMY AND FUSION (HEDRON);  Surgeon: Jodeen Munch, MD;  Location: [facility] ORS;  Service: Neurosurgery;  Laterality: N/A;   LUMBAR LAMINECTOMY/DECOMPRESSION MICRODISCECTOMY Left 04/07/2016   Procedure: Left Lumbar four-five Microdiskectomy;  Surgeon: Manya Sells, MD;  Location: [facility] ORS;  Service: Neurosurgery;  Laterality: Left;   Family History:  Family History  Problem Relation Age of Onset   Healthy Mother    Diabetes Mellitus II Father    Breast cancer Sister    Diabetes Brother    Heart disease Neg Hx    Stroke Neg Hx    Kidney disease Neg Hx     Social History:  Social History   Substance and Sexual Activity  Alcohol Use No     Social History   Substance and Sexual Activity  Drug Use Yes   Types: Marijuana   Comment: Cocaine -- 04/06/16- last time, smokes marijuana    Social History   Socioeconomic History   Marital status: Divorced    Spouse name: Not on file   Number of  children: 3   Years of education: Not on file   Highest education level: High school graduate  Occupational History   Not on file  Tobacco Use   Smoking status: Every Day    Current packs/day: 1.00    Average packs/day: 1 pack/day for 39.0 years (39.0 ttl pk-yrs)    Types: Cigarettes   Smokeless tobacco: Never  Vaping Use   Vaping status: Never Used  Substance and Sexual Activity   Alcohol use: No   Drug use: Yes     Types: Marijuana    Comment: Cocaine -- 04/06/16- last time, smokes marijuana   Sexual activity: Yes    Birth control/protection: Surgical  Other Topics Concern   Not on file  Social History Narrative   Lives with daughter   Right Handed   Drinks 1 cup of coffee per day      Social Drivers of Health   Financial Resource Strain: Low Risk  (04/08/2023)   Overall Financial Resource Strain (CARDIA)    Difficulty of Paying Living Expenses: Not hard at all  Food Insecurity: No Food Insecurity (03/19/2024)   Hunger Vital Sign    Worried About Running Out of Food in the Last Year: Never true    Ran Out of Food in the Last Year: Never true  Transportation Needs: No Transportation Needs (03/19/2024)   PRAPARE - Administrator, Civil Service (Medical): No    Lack of Transportation (Non-Medical): No  Physical Activity: Inactive (04/08/2023)   Exercise Vital Sign    Days of Exercise per Week: 0 days    Minutes of Exercise per Session: 0 min  Stress: No Stress Concern Present (04/08/2023)   [employer] of Occupational Health - Occupational Stress Questionnaire    Feeling of Stress : Only a little  Social Connections: Moderately Isolated (04/08/2023)   Social Connection and Isolation Panel    Frequency of Communication with Friends and Family: More than three times a week    Frequency of Social Gatherings with Friends and Family: Three times a week    Attends Religious Services: More than 4 times per year    Active Member of Clubs or Organizations: No    Attends Banker Meetings: Never    Marital Status: Divorced   Additional Social History:                         Sleep: Good Estimated Sleeping Duration (Last 24 Hours): 6.25-8.00 hours  Appetite:  Good  Current Medications: Current Facility-Administered Medications  Medication Dose Route Frequency Provider Last Rate Last Admin   acetaminophen  (TYLENOL ) tablet 650 mg  650 mg Oral Q6H PRN Weber, Kyra  A, NP       alum & mag hydroxide-simeth (MAALOX/MYLANTA) 200-200-20 MG/5ML suspension 30 mL  30 mL Oral Q4H PRN Weber, Kyra A, NP       dextrose  (GLUTOSE) oral gel 40% (peds > 20kg and adults)  1 Tube Oral Once PRN Dixon, Letcher Rattler, NP       haloperidol (HALDOL) tablet 5 mg  5 mg Oral TID PRN Weber, Kyra A, NP       And   diphenhydrAMINE  (BENADRYL ) capsule 50 mg  50 mg Oral TID PRN Weber, Kyra A, NP       haloperidol lactate (HALDOL) injection 5 mg  5 mg Intramuscular TID PRN Weber, Kyra A, NP       And   diphenhydrAMINE  (BENADRYL ) injection 50  mg  50 mg Intramuscular TID PRN Weber, Kyra A, NP       And   LORazepam  (ATIVAN ) injection 2 mg  2 mg Intramuscular TID PRN Weber, Kyra A, NP       haloperidol lactate (HALDOL) injection 10 mg  10 mg Intramuscular TID PRN Weber, Kyra A, NP       And   diphenhydrAMINE  (BENADRYL ) injection 50 mg  50 mg Intramuscular TID PRN Weber, Kyra A, NP       And   LORazepam  (ATIVAN ) injection 2 mg  2 mg Intramuscular TID PRN Weber, Kyra A, NP       glipiZIDE  (GLUCOTROL  XL) 24 hr tablet 5 mg  5 mg Oral BID PC Shrivastava, Aryendra, MD   5 mg at 03/23/24 0858   hydrOXYzine (ATARAX) tablet 50 mg  50 mg Oral Q6H PRN Shrivastava, Aryendra, MD   50 mg at 03/23/24 0416   insulin  aspart (novoLOG ) injection 0-5 Units  0-5 Units Subcutaneous QHS Shrivastava, Aryendra, MD   2 Units at 03/22/24 2112   insulin  aspart (novoLOG ) injection 0-9 Units  0-9 Units Subcutaneous TID WC Shrivastava, Aryendra, MD   5 Units at 03/23/24 0858   insulin  aspart (novoLOG ) injection 3 Units  3 Units Subcutaneous TID WC Ellon Marasco, MD       insulin  glargine-yfgn (SEMGLEE ) injection 29 Units  29 Units Subcutaneous QHS Aurelia Blotter, MD       magnesium  hydroxide (MILK OF MAGNESIA) suspension 30 mL  30 mL Oral Daily PRN Weber, Kyra A, NP       QUEtiapine  (SEROQUEL ) tablet 400 mg  400 mg Oral BID Weber, Kyra A, NP   400 mg at 03/23/24 0858   rosuvastatin  (CRESTOR ) tablet 40 mg  40 mg Oral  Daily Weber, Kyra A, NP   40 mg at 03/23/24 1610    Lab Results:  Results for orders placed or performed during the hospital encounter of 03/19/24 (from the past 48 hours)  Glucose, capillary     Status: Abnormal   Collection Time: 03/21/24 11:45 AM  Result Value Ref Range   Glucose-Capillary 196 (H) 70 - 99 mg/dL    Comment: Glucose reference range applies only to samples taken after fasting for at least 8 hours.  Glucose, capillary     Status: Abnormal   Collection Time: 03/21/24  4:44 PM  Result Value Ref Range   Glucose-Capillary 214 (H) 70 - 99 mg/dL    Comment: Glucose reference range applies only to samples taken after fasting for at least 8 hours.  Glucose, capillary     Status: Abnormal   Collection Time: 03/21/24  7:57 PM  Result Value Ref Range   Glucose-Capillary 214 (H) 70 - 99 mg/dL    Comment: Glucose reference range applies only to samples taken after fasting for at least 8 hours.  Glucose, capillary     Status: Abnormal   Collection Time: 03/22/24  6:06 AM  Result Value Ref Range   Glucose-Capillary 178 (H) 70 - 99 mg/dL    Comment: Glucose reference range applies only to samples taken after fasting for at least 8 hours.  Lipid panel     Status: Abnormal   Collection Time: 03/22/24  6:57 AM  Result Value Ref Range   Cholesterol 187 0 - 200 mg/dL   Triglycerides 960 (H) <150 mg/dL   HDL 60 >45 mg/dL   Total CHOL/HDL Ratio 3.1 RATIO   VLDL 31 0 - 40 mg/dL   LDL  Cholesterol 96 0 - 99 mg/dL    Comment:        Total Cholesterol/HDL:CHD Risk Coronary Heart Disease Risk Table                     Men   Women  1/2 Average Risk   3.4   3.3  Average Risk       5.0   4.4  2 X Average Risk   9.6   7.1  3 X Average Risk  23.4   11.0        Use the calculated Patient Ratio above and the CHD Risk Table to determine the patient's CHD Risk.        ATP III CLASSIFICATION (LDL):  <100     mg/dL   Optimal  161-096  mg/dL   Near or Above                    Optimal   130-159  mg/dL   Borderline  045-409  mg/dL   High  >811     mg/dL   Very High Performed at [facility], [address] [place], [place] [address]   Glucose, capillary     Status: Abnormal   Collection Time: 03/22/24  7:30 AM  Result Value Ref Range   Glucose-Capillary 200 (H) 70 - 99 mg/dL    Comment: Glucose reference range applies only to samples taken after fasting for at least 8 hours.  Glucose, capillary     Status: Abnormal   Collection Time: 03/22/24 11:09 AM  Result Value Ref Range   Glucose-Capillary 303 (H) 70 - 99 mg/dL    Comment: Glucose reference range applies only to samples taken after fasting for at least 8 hours.  Glucose, capillary     Status: Abnormal   Collection Time: 03/22/24  1:36 PM  Result Value Ref Range   Glucose-Capillary 210 (H) 70 - 99 mg/dL    Comment: Glucose reference range applies only to samples taken after fasting for at least 8 hours.  Glucose, capillary     Status: Abnormal   Collection Time: 03/22/24  4:36 PM  Result Value Ref Range   Glucose-Capillary 153 (H) 70 - 99 mg/dL    Comment: Glucose reference range applies only to samples taken after fasting for at least 8 hours.  Glucose, capillary     Status: Abnormal   Collection Time: 03/22/24  8:09 PM  Result Value Ref Range   Glucose-Capillary 233 (H) 70 - 99 mg/dL    Comment: Glucose reference range applies only to samples taken after fasting for at least 8 hours.  Glucose, capillary     Status: Abnormal   Collection Time: 03/23/24  7:43 AM  Result Value Ref Range   Glucose-Capillary 258 (H) 70 - 99 mg/dL    Comment: Glucose reference range applies only to samples taken after fasting for at least 8 hours.    Blood Alcohol level:  Lab Results  Component Value Date   [facility] <15 03/18/2024   ETH <5 05/17/2017    Metabolic Disorder Labs: Lab Results  Component Value Date   HGBA1C 12.5 (A) 01/27/2024   MPG 212 07/02/2016   MPG 126 (H) 05/01/2013   Lab Results  Component  Value Date   PROLACTIN 1.8 (L) 06/06/2020   Lab Results  Component Value Date   CHOL 187 03/22/2024   TRIG 155 (H) 03/22/2024   HDL 60 03/22/2024   CHOLHDL 3.1  03/22/2024   VLDL 31 03/22/2024   LDLCALC 96 03/22/2024   LDLCALC 77 11/11/2022    Physical Findings: AIMS:  ,  ,  ,  ,  ,  ,   CIWA:    COWS:     Musculoskeletal: Strength & Muscle Tone: within normal limits Gait & Station: normal Patient leans: N/A  Psychiatric Specialty Exam:  Presentation  General Appearance:  Appropriate for Environment  Eye Contact: Good  Speech: Clear and Coherent  Speech Volume: Normal  Handedness:No data recorded  Mood and Affect  Mood: Irritable  Affect: Congruent; Full Range   Thought Process  Thought Processes: Linear; Coherent  Descriptions of Associations:Intact  Orientation:Full (Time, Place and Person)  Thought Content:Logical  History of Schizophrenia/Schizoaffective disorder:Yes  Duration of Psychotic Symptoms:No data recorded Hallucinations:Hallucinations: None   Ideas of Reference:None  Suicidal Thoughts:Suicidal Thoughts: No   Homicidal Thoughts:Homicidal Thoughts: No    Sensorium  Memory: Immediate Good; Recent Fair; Remote Fair  Judgment: Fair  Insight: Fair   Art therapist  Concentration: Fair  Attention Span: Fair  Recall: [employer] of Knowledge: Fair  Language: Fair   Psychomotor Activity  Psychomotor Activity: No data recorded   Assets  Assets: Communication Skills; Social Support; Physical Health; Vocational/Educational   Sleep  Sleep: No data recorded    Physical Exam: Physical Exam Vitals and nursing note reviewed.    Review of Systems  Constitutional: Negative.   Eyes: Negative.   Skin: Negative.    Blood pressure 119/74, pulse 91, temperature (!) 97.2 F (36.2 C), resp. rate 18, height 5' 7 (1.702 m), weight 68.5 kg, SpO2 100%. Body mass index is 23.65 kg/m.   Treatment  Plan Summary: Assessment/Plan Assessment/Plan   Adjustment disorder with depressed and anxious mood. Psychosocial stressors. History of bipolar disorder-rule out borderline personality disorder.     1.    Safety and Monitoring:   -- Voluntary admission to inpatient psychiatric unit for safety, stabilization and treatment -- Daily contact with patient to assess and evaluate symptoms and progress in treatment -- Patient's case to be discussed in multi-disciplinary team meeting -- Observation Level : q15 minute checks -- Vital signs:  q12 hours -- Precautions: suicide   2. Psychiatric Diagnoses and Treatment: Continue home medication Seroquel  400 mg twice daily.  Discussed with patient about risks of worsening diabetes and metabolic burden.  Patient expressed understanding but reports that she has been stable on this medication for more than 20 years.     As needed hydroxyzine for anxiety.  --  The risks/benefits/side-effects/alternatives to this medication were discussed in detail with the patient and time was given for questions. The patient consents to medication trial. -- Metabolic profile and EKG monitoring obtained while on an atypical antipsychotic  (BMI: 0.00, QTC: 0.00, HbA1c: 0.00, Lipid panel: 0.00) -- Encouraged patient to participate in unit milieu and in scheduled group therapies -- Short Term Goals: Ability to identify changes in lifestyle to reduce recurrence of condition will improve, Ability to verbalize feelings will improve, Ability to disclose and discuss suicidal ideas, Ability to demonstrate self-control will improve, Ability to identify and develop effective coping behaviors will improve, Ability to maintain clinical measurements within normal limits will improve, Compliance with prescribed medications will improve, and Ability to identify triggers associated with substance abuse/mental health issues will improve -- Long Term Goals: Improvement in symptoms so as ready  for discharge        3. Medical Issues Being Addressed: Patient has not had episode  of hypoglycemia.  We will discontinue aspart 10 units subcutaneous with meals.  Increase insulin  glargine to 27 units q.h.s.... Continue insulin  sliding scale.  We will reduce glipizide  to 5 mg twice daily.   Appreciate diabetic consult recommendation.  Hyperlipidemia-Crestor  40 mg daily.   4. Discharge Planning:   -- Social work and case management to assist with discharge planning and identification of hospital follow-up needs prior to discharge -- Estimated LOS: 5-7 days -- Discharge Concerns: Need to establish a safety plan; Medication compliance and effectiveness -- Discharge Goals: Return home with outpatient referrals for mental health follow-up including medication management/psychotherapy    Ivadell Gaul, MD 03/23/2024, 11:28 AM

## 2024-03-23 NOTE — Inpatient Diabetes Management (Addendum)
 Inpatient Diabetes Program Recommendations  AACE/ADA: New Consensus Statement on Inpatient Glycemic Control   Target Ranges:  Prepandial:   less than 140 mg/dL      Peak postprandial:   less than 180 mg/dL (1-2 hours)      Critically ill patients:  140 - 180 mg/dL    Latest Reference Range & Units 03/23/24 07:43  Glucose-Capillary 70 - 99 mg/dL 578 (H)    Latest Reference Range & Units 03/22/24 07:30 03/22/24 11:09 03/22/24 13:36 03/22/24 16:36 03/22/24 20:09  Glucose-Capillary 70 - 99 mg/dL 469 (H) 629 (H) 528 (H) 153 (H) 233 (H)   Review of Glycemic Control  Diabetes history: DM2 Outpatient Diabetes medications: Basaglar  55 units at bedtime, Humalog  10 units TID with meals, Glipizide  10 mg BID Current orders for Inpatient glycemic control: Semglee  27 units at bedtime, Novolog  0-9 units TID with meals, Novolog  0-5 units at bedtime, Glipizide  XL 5 mg BID   Inpatient Diabetes Program Recommendations:     Insulin : Please consider increasing Semglee  to 29 units at bedtime and ordering Novolog  3 units TID with meals for meal coverage if patient eats at least 50% of meals.  Thanks, Beacher Limerick, RN, MSN, CDCES Diabetes Coordinator Inpatient Diabetes Program [phone] (Team Pager from 8am to 5pm)

## 2024-03-23 NOTE — Progress Notes (Signed)
   03/23/24 0945  Psych Admission Type (Psych Patients Only)  Admission Status Voluntary  Psychosocial Assessment  Patient Complaints Irritability  Eye Contact Fair  Facial Expression Animated  Affect Appropriate to circumstance  Speech Logical/coherent  Interaction Assertive  Motor Activity Slow  Appearance/Hygiene In scrubs  Behavior Characteristics Cooperative  Mood Irritable  Thought Process  Coherency WDL  Content WDL  Delusions None reported or observed  Perception WDL  Hallucination None reported or observed  Judgment Impaired  Confusion None  Danger to Self  Current suicidal ideation? Denies  Self-Injurious Behavior No self-injurious ideation or behavior indicators observed or expressed   Agreement Not to Harm Self Yes  Description of Agreement verbal  Danger to Others  Danger to Others None reported or observed  Danger to Others Abnormal  Harmful Behavior to others No threats or harm toward other people

## 2024-03-23 NOTE — Group Note (Signed)
 Date:  03/23/2024 Time:  10:52 AM  Group Topic/Focus:  Fresh air Therapy Outdoors, with Music and conversation    Participation Level:  Did Not Attend    Ashley [name] 03/23/2024, 10:52 AM

## 2024-03-23 NOTE — Group Note (Signed)
 Date:  03/23/2024 Time:  11:37 PM  Group Topic/Focus:  Wrap-Up Group:   The focus of this group is to help patients review their daily goal of treatment and discuss progress on daily workbooks.    Participation Level:  Active  Participation Quality:  Appropriate  Affect:  Appropriate  Cognitive:  Appropriate  Insight: Appropriate  Engagement in Group:  Engaged  Modes of Intervention:  Discussion   Bradley Caffey 03/23/2024, 11:37 PM

## 2024-03-23 NOTE — Group Note (Signed)
 Physical/Occupational Therapy Group Note  Group Topic: Yoga  Group Date: 03/23/2024 Start Time: 1300 End Time: 1333 Facilitators: Judia Arnott, Otelia Blew, PT   Group Description: Group participated with series of yoga poses, designed to emphasize functional sitting balance, core stability, generalized flexibility and overall posture.  Incorporated deep breathing techniques with poses, working to promote relaxation, mindfulness and focus with targeted activities.   Discussed benefits of yoga in improving mood and self-esteem, reducing stress and anxiety, and promoting functional strength and balance for each participant.  Discussed ways to integrate into each participant's daily routine.  Provided handout with written and pictorial descriptions of included yoga movements to be utilized as appropriate outside of group time.  Therapeutic Goal(s):  Demonstrate safe ability to participate with yoga poses during group activity. Identify one benefit of participation with yoga poses as part of each participant's exercise/movement routine. Identify 1-2 individual poses that participant feels most beneficial to his/her needs and that he/she can easily replicate outside of group.  Individual Participation: Did not attend  Participation Level:   Participation Quality:   Behavior:   Speech/Thought Process:   Affect/Mood:   Insight:   Judgement:   Modes of Intervention:   Patient Response to Interventions:    Plan: Continue to engage patient in PT/OT groups 1 - 2x/week.  Lavenia Post PT, DPT 03/23/24, 5:26 PM

## 2024-03-23 NOTE — Plan of Care (Signed)
   Problem: Fluid Volume: Goal: Ability to maintain a balanced intake and output will improve Outcome: Progressing   Problem: Coping: Goal: Ability to adjust to condition or change in health will improve Outcome: Progressing

## 2024-03-23 NOTE — Progress Notes (Signed)
   03/23/24 2000  Psych Admission Type (Psych Patients Only)  Admission Status Voluntary  Psychosocial Assessment  Patient Complaints Anxiety  Eye Contact Fair  Facial Expression Animated  Affect Appropriate to circumstance  Speech Logical/coherent  Interaction Assertive  Motor Activity Slow  Appearance/Hygiene In scrubs  Behavior Characteristics Cooperative  Mood Anxious;Pleasant  Thought Process  Coherency WDL  Content WDL  Delusions None reported or observed  Perception WDL  Hallucination None reported or observed  Judgment Impaired  Confusion None  Danger to Self  Current suicidal ideation? Denies  Self-Injurious Behavior No self-injurious ideation or behavior indicators observed or expressed   Agreement Not to Harm Self Yes  Description of Agreement verbal  Danger to Others  Danger to Others None reported or observed  Danger to Others Abnormal  Harmful Behavior to others No threats or harm toward other people  Destructive Behavior No threats or harm toward property

## 2024-03-24 ENCOUNTER — Other Ambulatory Visit: Payer: Self-pay

## 2024-03-24 LAB — GLUCOSE, CAPILLARY: Glucose-Capillary: 156 mg/dL — ABNORMAL HIGH (ref 70–99)

## 2024-03-24 MED ORDER — INSULIN ASPART 100 UNIT/ML IJ SOLN
3.0000 [IU] | Freq: Three times a day (TID) | INTRAMUSCULAR | 0 refills | Status: DC
  Filled 2024-03-24: qty 10, 111d supply, fill #0

## 2024-03-24 MED ORDER — HYDROXYZINE HCL 50 MG PO TABS
50.0000 mg | ORAL_TABLET | Freq: Four times a day (QID) | ORAL | 0 refills | Status: AC | PRN
  Filled 2024-03-24: qty 30, 8d supply, fill #0

## 2024-03-24 MED ORDER — GLIPIZIDE ER 5 MG PO TB24
5.0000 mg | ORAL_TABLET | Freq: Two times a day (BID) | ORAL | 0 refills | Status: AC
  Filled 2024-03-24: qty 60, 30d supply, fill #0

## 2024-03-24 MED ORDER — INSULIN LISPRO 100 UNIT/ML IJ SOLN
INTRAMUSCULAR | 0 refills | Status: AC
  Filled 2024-03-24: qty 10, 28d supply, fill #0

## 2024-03-24 MED ORDER — INSULIN GLARGINE-YFGN 100 UNIT/ML ~~LOC~~ SOLN
29.0000 [IU] | Freq: Every day | SUBCUTANEOUS | 0 refills | Status: DC
  Filled 2024-03-24: qty 10, 34d supply, fill #0

## 2024-03-24 MED ORDER — INSULIN ASPART 100 UNIT/ML IJ SOLN
0.0000 [IU] | Freq: Every day | INTRAMUSCULAR | 0 refills | Status: DC
  Filled 2024-03-24: qty 10, 200d supply, fill #0

## 2024-03-24 NOTE — BHH Suicide Risk Assessment (Signed)
 [facility] Discharge Suicide Risk Assessment   Principal Problem: Schizophrenia [facility]) Discharge Diagnoses: Principal Problem:   Schizophrenia (HCC)   Total Time spent with patient: 30 minutes  Musculoskeletal: Strength & Muscle Tone: within normal limits Gait & Station: unsteady Patient leans: N/A  Psychiatric Specialty Exam  Presentation  General Appearance:  Appropriate for Environment  Eye Contact: Good  Speech: Clear and Coherent  Speech Volume: Normal  Handedness:No data recorded  Mood and Affect  Mood: Irritable  Duration of Depression Symptoms: Greater than two weeks  Affect: Congruent; Full Range   Thought Process  Thought Processes: Linear; Coherent  Descriptions of Associations:Intact  Orientation:Full (Time, Place and Person)  Thought Content:Logical  History of Schizophrenia/Schizoaffective disorder:Yes  Duration of Psychotic Symptoms:No data recorded Hallucinations:Hallucinations: None  Ideas of Reference:None  Suicidal Thoughts:Suicidal Thoughts: No  Homicidal Thoughts:Homicidal Thoughts: No   Sensorium  Memory: Immediate Good; Recent Fair; Remote Fair  Judgment: Fair  Insight: Fair   Art therapist  Concentration: Fair  Attention Span: Fair  Recall: [employer] of Knowledge: Fair  Language: Fair   Psychomotor Activity  Psychomotor Activity:No data recorded  Assets  Assets: Communication Skills; Social Support; Physical Health; Vocational/Educational   Sleep  Sleep:No data recorded Estimated Sleeping Duration (Last 24 Hours): 8.50-10.75 hours  Physical Exam: Physical Exam ROS Blood pressure 109/65, pulse 87, temperature 98.6 F (37 C), resp. rate 16, height 5' 7 (1.702 m), weight 68.5 kg, SpO2 100%. Body mass index is 23.65 kg/m.  Mental Status Per Nursing Assessment::   On Admission:  Thoughts of violence towards others  Demographic Factors:  Low socio-economic factors  Loss  Factors: Decrease in vocational status  Historical Factors: Impulsivity  Risk Reduction Factors:   Positive social support, Positive therapeutic relationship, and Positive coping skills or problem solving skills  Continued Clinical Symptoms:  Depression:   Comorbid alcohol abuse/dependence  Cognitive Features That Contribute To Risk:  None    Suicide Risk:  Minimal: No identifiable suicidal ideation.  Patients presenting with no risk factors but with morbid ruminations; may be classified as minimal risk based on the severity of the depressive symptoms    Plan Of Care/Follow-up recommendations:  Activity:  As tolerated  Aurelia Blotter, MD 03/24/2024, 9:17 AM

## 2024-03-24 NOTE — Group Note (Signed)
 Date:  03/24/2024 Time:  10:58 AM  Group Topic/Focus:  Fresh air Therapy Outdoors,    Participation Level:  Did Not Attend    Merton Abts 03/24/2024, 10:58 AM

## 2024-03-24 NOTE — Plan of Care (Signed)
  Problem: Coping: Goal: Ability to adjust to condition or change in health will improve Outcome: Progressing   Problem: Health Behavior/Discharge Planning: Goal: Ability to manage health-related needs will improve Outcome: Progressing   Problem: Education: Goal: Mental status will improve Outcome: Progressing   Problem: Activity: Goal: Sleeping patterns will improve Outcome: Progressing

## 2024-03-24 NOTE — BHH Counselor (Signed)
 CSW contacted pt's daughter [name] per pt's request.   Pt's daughter reports she left the keys at the rental office for pt to pick up.   Derrill Flirt, MSW, [facility] 03/24/2024 10:44 AM

## 2024-03-24 NOTE — Progress Notes (Signed)
   03/24/24 1002  Psych Admission Type (Psych Patients Only)  Admission Status Voluntary  Psychosocial Assessment  Patient Complaints None  Eye Contact Fair  Facial Expression Animated  Affect Appropriate to circumstance  Speech Logical/coherent  Interaction Assertive  Motor Activity Slow  Appearance/Hygiene In scrubs  Behavior Characteristics Cooperative  Mood Anxious  Thought Process  Coherency WDL  Content WDL  Delusions None reported or observed  Perception WDL  Hallucination None reported or observed  Judgment Impaired  Confusion None  Danger to Self  Current suicidal ideation? Denies  Self-Injurious Behavior No self-injurious ideation or behavior indicators observed or expressed   Agreement Not to Harm Self Yes  Description of Agreement verbal  Danger to Others  Danger to Others None reported or observed  Danger to Others Abnormal  Harmful Behavior to others No threats or harm toward other people  Destructive Behavior No threats or harm toward property

## 2024-03-24 NOTE — Progress Notes (Signed)
 Patient denies SI/I/AVH at this time. Discharge instructions, AVS, prescriptions, and transition record reviewed with patient. Patient agrees to comply with medication management, follow-up visit and outpatient therapy. Patient belongings returned to patient. Patient questions and concerns addressed and answered.  Patient ambulatory off unit. Patient discharged with home via taxi.

## 2024-03-24 NOTE — Progress Notes (Signed)
  [facility] Adult Case Management Discharge Plan :  Will you be returning to the same living situation after discharge:  Yes,  pt will return home At discharge, do you have transportation home?: Yes,  CSW will assist with transportation  Do you have the ability to pay for your medications: Yes,  MEDICARE / MEDICARE PART A  Release of information consent forms completed and in the chart;  Patient's signature needed at discharge.  Patient to Follow up at:  Follow-up Information     [facility]. Go to.   Why: [facility] offers Open Access walk-in services for individuals seeking mental health or substance use treatment, available Monday through Friday, 8 a.m. to 3 p.m. Patients can walk in during these hours for an initial assessment and potentially leave with a scheduled appointment or referral. The last walk-in is accepted at 3 p.m. daily. Contact information: [address]  [address] [place] [address] [phone]                 Next level of care provider has access to [facility] Link:no  Safety Planning and Suicide Prevention discussed: Yes,  SPE completed with pt      Has patient been referred to the Quitline?: Patient refused referral for treatment  Patient has been referred for addiction treatment: No known substance use disorder.  [address], LCSWA 03/24/2024, 10:19 AM

## 2024-03-24 NOTE — Discharge Summary (Signed)
 Physician Discharge Summary Note  Patient:  Ashley [name] is an 63 y.o., female MRN:  [id number] DOB:  [birth year] Patient phone:  [phone] (home)  Patient address:   [address] [place] [address],    Date of Admission:  03/19/2024 Date of Discharge: 03/24/24  Reason for Admission:  62 year old African-American female with past history of questionable bipolar disorder, questionable schizophrenia, anxiety, and past medical history of hypertension, hyperlipidemia, diabetes, cervical myelopathy, stroke presented to the hospital ED with reportedly having suicidal and homicidal thoughts. Patient was seen in the ED and was transferred to our behavioral health unit for further treatment.    Patient on initial interview at the behavioral health unit denied any suicidal or homicidal thoughts or any auditory or visual hallucinations.  Reports that she is in the hospital because of the roaches and her living situation. Patient is admitted to [facility] unit with Q15 min safety monitoring. Multidisciplinary team approach is offered. Medication management; group/milieu therapy is offered.   Principal Problem: Schizophrenia [facility]) Discharge Diagnoses: Principal Problem:   Schizophrenia (HCC)   Past Psychiatric History: see h&P  Family Psychiatric  History: see h&p Social History:  Social History   Substance and Sexual Activity  Alcohol Use No     Social History   Substance and Sexual Activity  Drug Use Yes   Types: Marijuana   Comment: Cocaine -- 04/06/16- last time, smokes marijuana    Social History   Socioeconomic History   Marital status: Divorced    Spouse name: Not on file   Number of children: 3   Years of education: Not on file   Highest education level: High school graduate  Occupational History   Not on file  Tobacco Use   Smoking status: Every Day    Current packs/day: 1.00    Average packs/day: 1 pack/day for 39.0 years (39.0 ttl pk-yrs)    Types:  Cigarettes   Smokeless tobacco: Never  Vaping Use   Vaping status: Never Used  Substance and Sexual Activity   Alcohol use: No   Drug use: Yes    Types: Marijuana    Comment: Cocaine -- 04/06/16- last time, smokes marijuana   Sexual activity: Yes    Birth control/protection: Surgical  Other Topics Concern   Not on file  Social History Narrative   Lives with daughter   Right Handed   Drinks 1 cup of coffee per day      Social Drivers of Health   Financial Resource Strain: Low Risk  (04/08/2023)   Overall Financial Resource Strain (CARDIA)    Difficulty of Paying Living Expenses: Not hard at all  Food Insecurity: No Food Insecurity (03/19/2024)   Hunger Vital Sign    Worried About Running Out of Food in the Last Year: Never true    Ran Out of Food in the Last Year: Never true  Transportation Needs: No Transportation Needs (03/19/2024)   PRAPARE - Administrator, Civil Service (Medical): No    Lack of Transportation (Non-Medical): No  Physical Activity: Inactive (04/08/2023)   Exercise Vital Sign    Days of Exercise per Week: 0 days    Minutes of Exercise per Session: 0 min  Stress: No Stress Concern Present (04/08/2023)   [employer] of Occupational Health - Occupational Stress Questionnaire    Feeling of Stress : Only a little  Social Connections: Moderately Isolated (04/08/2023)   Social Connection and Isolation Panel    Frequency of  Communication with Friends and Family: More than three times a week    Frequency of Social Gatherings with Friends and Family: Three times a week    Attends Religious Services: More than 4 times per year    Active Member of Clubs or Organizations: No    Attends Banker Meetings: Never    Marital Status: Divorced   Past Medical History:  Past Medical History:  Diagnosis Date   Anxiety    Arthritis    Bipolar 1 disorder (HCC)    Cervical myelopathy (HCC) 12/28/2020   Cervical spinal cord compression (HCC) 12/19/2020    Depression    Diabetes mellitus without complication (HCC)    Type II   GERD (gastroesophageal reflux disease)    Heart murmur    nothing to worry about   HNP (herniated nucleus pulposus) with myelopathy, cervical    HTN (hypertension)    not on medication   Pneumonia    Quadriplegia and quadriparesis (HCC)    Stroke (HCC) 11/2020   per patient did not have a stroke    Past Surgical History:  Procedure Laterality Date   ABDOMINAL HYSTERECTOMY     ANTERIOR CERVICAL DECOMP/DISCECTOMY FUSION N/A 12/25/2020   Procedure: CERVICAL THREE-FOUR ANTERIOR CERVICAL DECOMPRESSION/DISCECTOMY FUSION;  Surgeon: Cannon Champion, MD;  Location: [facility] OR;  Service: Neurosurgery;  Laterality: N/A;  CERVICAL THREE-FOUR ANTERIOR CERVICAL DECOMPRESSION/DISCECTOMY FUSION    ANTERIOR CERVICAL DECOMP/DISCECTOMY FUSION N/A 04/13/2023   Procedure: C3-5 ANTERIOR CERVICAL DISCECTOMY AND FUSION (HEDRON);  Surgeon: Jodeen Munch, MD;  Location: [facility] ORS;  Service: Neurosurgery;  Laterality: N/A;   LUMBAR LAMINECTOMY/DECOMPRESSION MICRODISCECTOMY Left 04/07/2016   Procedure: Left Lumbar four-five Microdiskectomy;  Surgeon: Manya Sells, MD;  Location: [facility] NEURO ORS;  Service: Neurosurgery;  Laterality: Left;   Family History:  Family History  Problem Relation Age of Onset   Healthy Mother    Diabetes Mellitus II Father    Breast cancer Sister    Diabetes Brother    Heart disease Neg Hx    Stroke Neg Hx    Kidney disease Neg Hx     Hospital Course:  63 year old African-American female with past history of questionable bipolar disorder, questionable schizophrenia, anxiety, and past medical history of hypertension, hyperlipidemia, diabetes, cervical myelopathy, stroke presented to the hospital ED with reportedly having suicidal and homicidal thoughts. Patient was seen in the ED and was transferred to our behavioral health unit for further treatment.    Patient on initial interview at the behavioral health  unit denied any suicidal or homicidal thoughts or any auditory or visual hallucinations.  Reports that she is in the hospital because of the roaches and her living situation. Patient is admitted to [facility] unit with Q15 min safety monitoring. Multidisciplinary team approach is offered. Medication management; group/milieu therapy is offered.  On admission since patient's SI was situational her home medication Seroquel  400 mg twice daily was restarted and continued.  Patient did very well since her admission and consistently denied SI/HI/plan.  She maintained safe behaviors on the unit and intermittently participated in the groups.  There were no events or episodes of agitation.  No other medication adjustments were done during this time.  Her blood sugars were managed by the diabetic specialist by adjusting the insulin  regimen.  On the day of discharge patient consistently denied SI/HI/plan and hallucinations.  She remains future oriented and is willing to participate in outpatient mental health services.   Physical Findings: AIMS:  , ,  ,  ,  CIWA:    COWS:        Psychiatric Specialty Exam:  Presentation  General Appearance:  Appropriate for Environment; Casual  Eye Contact: Fair  Speech: Clear and Coherent  Speech Volume: Normal    Mood and Affect  Mood: Euthymic  Affect: Appropriate   Thought Process  Thought Processes: Coherent  Descriptions of Associations:Intact  Orientation:Full (Time, Place and Person)  Thought Content:Logical  Hallucinations:Hallucinations: None  Ideas of Reference:None  Suicidal Thoughts:Suicidal Thoughts: No  Homicidal Thoughts:Homicidal Thoughts: No   Sensorium  Memory: Immediate Fair; Recent Fair; Remote Fair  Judgment: Fair  Insight: Fair   Art therapist  Concentration: Fair  Attention Span: Fair  Recall: [employer] of Knowledge: Fair  Language: Fair   Psychomotor Activity  Psychomotor  Activity: Psychomotor Activity: Normal  Musculoskeletal: Strength & Muscle Tone: within normal limits Gait & Station: normal Assets  Assets: Manufacturing systems engineer; Desire for Improvement; Social Support; Resilience   Sleep  Sleep: Sleep: Fair    Physical Exam: Physical Exam ROS Blood pressure 109/65, pulse 87, temperature 98.6 F (37 C), resp. rate 16, height 5' 7 (1.702 m), weight 68.5 kg, SpO2 100%. Body mass index is 23.65 kg/m.   Social History   Tobacco Use  Smoking Status Every Day   Current packs/day: 1.00   Average packs/day: 1 pack/day for 39.0 years (39.0 ttl pk-yrs)   Types: Cigarettes  Smokeless Tobacco Never   Tobacco Cessation:  N/A, patient does not currently use tobacco products   Blood Alcohol level:  Lab Results  Component Value Date   [facility] <15 03/18/2024   ETH <5 05/17/2017    Metabolic Disorder Labs:  Lab Results  Component Value Date   HGBA1C 12.5 (A) 01/27/2024   MPG 212 07/02/2016   MPG 126 (H) 05/01/2013   Lab Results  Component Value Date   PROLACTIN 1.8 (L) 06/06/2020   Lab Results  Component Value Date   CHOL 187 03/22/2024   TRIG 155 (H) 03/22/2024   HDL 60 03/22/2024   CHOLHDL 3.1 03/22/2024   VLDL 31 03/22/2024   LDLCALC 96 03/22/2024   LDLCALC 77 11/11/2022    See Psychiatric Specialty Exam and Suicide Risk Assessment completed by Attending Physician prior to discharge.  Discharge destination:  Home  Is patient on multiple antipsychotic therapies at discharge:  No   Has Patient had three or more failed trials of antipsychotic monotherapy by history:  No  Recommended Plan for Multiple Antipsychotic Therapies: NA  Discharge Instructions     Diet - low sodium heart healthy   Complete by: As directed    Increase activity slowly   Complete by: As directed       Allergies as of 03/24/2024   No Known Allergies      Medication List     STOP taking these medications    Basaglar  KwikPen 100 UNIT/ML    HumaLOG  KwikPen 100 UNIT/ML KwikPen Generic drug: insulin  lispro       TAKE these medications      Indication  glipiZIDE  5 MG 24 hr tablet Commonly known as: GLUCOTROL  XL Take 1 tablet (5 mg total) by mouth 2 (two) times daily after a meal. What changed:  medication strength how much to take  Indication: Type 2 Diabetes   insulin  aspart 100 UNIT/ML injection Commonly known as: novoLOG  Inject 0-5 Units into the skin at bedtime.    insulin  aspart 100 UNIT/ML injection Commonly known as: novoLOG  Inject 0-9 Units into the skin 3 (  three) times daily with meals.    insulin  aspart 100 UNIT/ML injection Commonly known as: novoLOG  Inject 3 Units into the skin 3 (three) times daily with meals.    insulin  glargine-yfgn 100 UNIT/ML injection Commonly known as: SEMGLEE  Inject 0.29 mLs (29 Units total) into the skin at bedtime.    QUEtiapine  400 MG tablet Commonly known as: SEROQUEL  Take 1 tablet (400 mg total) by mouth 2 (two) times daily.    rosuvastatin  40 MG tablet Commonly known as: Crestor  Take 1 tablet (40 mg total) by mouth once daily.  Indication: High Amount of Fats in the Blood   True Metrix Blood Glucose Test test strip Generic drug: glucose blood Use to check blood sugar up to 3 times daily.    True Metrix Meter w/Device Kit Use to check blood sugar up to 3 times daily.    TRUEplus 5-Bevel Pen Needles 32G X 4 MM Misc Generic drug: Insulin  Pen Needle Use as directed.    TRUEplus Lancets 28G Misc Use to check blood sugar up to 3 times daily.          Follow-up recommendations:  Activity:  As tolerated    Signed: Nahiara Kretzschmar, MD 03/24/2024, 9:20 AM

## 2024-03-28 ENCOUNTER — Telehealth: Payer: Self-pay

## 2024-03-28 ENCOUNTER — Other Ambulatory Visit: Payer: Self-pay

## 2024-03-28 DIAGNOSIS — F3289 Other specified depressive episodes: Secondary | ICD-10-CM

## 2024-03-28 MED ORDER — QUETIAPINE FUMARATE 400 MG PO TABS
400.0000 mg | ORAL_TABLET | Freq: Two times a day (BID) | ORAL | 0 refills | Status: DC
  Filled 2024-03-28: qty 60, 30d supply, fill #0

## 2024-03-28 MED ORDER — QUETIAPINE FUMARATE 400 MG PO TABS: 400.0000 mg | ORAL_TABLET | Freq: Two times a day (BID) | ORAL | 0 refills | Status: DC

## 2024-03-28 NOTE — Transitions of Care (Post Inpatient/ED Visit) (Signed)
   03/28/2024  Name: Ashley [name] MRN: [id number] DOB: 12/15/1960  Today's TOC FU Call Status: Today's TOC FU Call Status:: Unsuccessful Call (1st Attempt) Unsuccessful Call (1st Attempt) Date: 03/28/24  Attempted to reach the patient regarding the most recent Inpatient/ED visit.  Follow Up Plan: Additional outreach attempts will be made to reach the patient to complete the Transitions of Care (Post Inpatient/ED visit) call.   Signature Slater Diesel, RN

## 2024-03-29 ENCOUNTER — Other Ambulatory Visit (HOSPITAL_COMMUNITY): Payer: Self-pay

## 2024-03-29 ENCOUNTER — Other Ambulatory Visit: Payer: Self-pay

## 2024-03-29 ENCOUNTER — Telehealth: Payer: Self-pay

## 2024-03-29 ENCOUNTER — Other Ambulatory Visit: Payer: Self-pay | Admitting: Critical Care Medicine

## 2024-03-29 DIAGNOSIS — F3289 Other specified depressive episodes: Secondary | ICD-10-CM

## 2024-03-30 ENCOUNTER — Ambulatory Visit: Admitting: Internal Medicine

## 2024-03-30 ENCOUNTER — Telehealth: Payer: Self-pay | Admitting: Critical Care Medicine

## 2024-03-30 ENCOUNTER — Other Ambulatory Visit: Payer: Self-pay

## 2024-03-30 NOTE — Telephone Encounter (Signed)
 error

## 2024-03-30 NOTE — Telephone Encounter (Signed)
 Call to patient unable to reach. VM full

## 2024-03-30 NOTE — Telephone Encounter (Signed)
 Copied from CRM [id number]. Topic: General - Other >> Mar 30, 2024 10:24 AM Donee H wrote: Reason for CRM: Patient states she is not sure who she spoke with on 03-29-2024 but she was overwhelmed at the time. She stated she is ok now and would like to talk to someone. She mention call was related to how she is feeling now she is back home from hospital. Requesting callback at  [phone].

## 2024-03-30 NOTE — Telephone Encounter (Signed)
 Copied from CRM [id number]. Topic: General - Other >> Mar 30, 2024 11:40 AM Ashley [name] wrote:  Reason for CRM: Patient following up on message she sent this morning about speaking with Cassandra. Patient stated, I need to see a psychiatrist I can't do it on my own. Per CAL advised to send a crm to clinical.   Patient can [phone]

## 2024-03-31 ENCOUNTER — Other Ambulatory Visit: Payer: Self-pay

## 2024-03-31 ENCOUNTER — Emergency Department (HOSPITAL_COMMUNITY)
Admission: EM | Admit: 2024-03-31 | Discharge: 2024-03-31 | Disposition: A | Discharge status: Home / Self Care | Payer: Self-pay | Attending: Emergency Medicine | Admitting: Emergency Medicine

## 2024-03-31 ENCOUNTER — Encounter: Payer: Self-pay | Admitting: Family Medicine

## 2024-03-31 ENCOUNTER — Encounter (HOSPITAL_COMMUNITY): Payer: Self-pay

## 2024-03-31 DIAGNOSIS — Z7984 Long term (current) use of oral hypoglycemic drugs: Secondary | ICD-10-CM | POA: Insufficient documentation

## 2024-03-31 DIAGNOSIS — Z760 Encounter for issue of repeat prescription: Secondary | ICD-10-CM | POA: Insufficient documentation

## 2024-03-31 DIAGNOSIS — Z794 Long term (current) use of insulin: Secondary | ICD-10-CM | POA: Insufficient documentation

## 2024-03-31 DIAGNOSIS — F3289 Other specified depressive episodes: Secondary | ICD-10-CM

## 2024-03-31 DIAGNOSIS — Z79899 Other long term (current) drug therapy: Secondary | ICD-10-CM | POA: Insufficient documentation

## 2024-03-31 MED ORDER — QUETIAPINE FUMARATE 400 MG PO TABS
400.0000 mg | ORAL_TABLET | Freq: Two times a day (BID) | ORAL | 0 refills | Status: AC
Start: 2024-03-31 — End: ?
  Filled 2024-03-31: qty 60, 30d supply, fill #0

## 2024-03-31 NOTE — Telephone Encounter (Signed)
 Call to patient to complete TOC call and to address current concerns unabler to reach VM left.

## 2024-03-31 NOTE — ED Provider Notes (Signed)
 [place] EMERGENCY DEPARTMENT AT [facility] Provider Note   CSN: [id number] Arrival date & time: 03/31/24  9172     Patient presents with: Medication Refill   Ashley [name] is a 63 y.o. female.   Patient presenting to the emergency department today requesting refill for Seroquel  400 milligram tablets.  Patient denies any medical complaints.       Prior to Admission medications   Medication Sig Start Date End Date Taking? Authorizing Provider  Blood Glucose Monitoring Suppl (TRUE METRIX METER) w/Device KIT Use to check blood sugar up to 3 times daily. 03/17/22   Brien Belvie BRAVO, MD  glipiZIDE  (GLUCOTROL  XL) 5 MG 24 hr tablet Take 1 tablet (5 mg total) by mouth 2 (two) times daily after a meal. 03/24/24   Donnelly Mellow, MD  glucose blood (TRUE METRIX BLOOD GLUCOSE TEST) test strip Use to check blood sugar up to 3 times daily. 05/19/23   Brien Belvie BRAVO, MD  hydrOXYzine  (ATARAX ) 50 MG tablet Take 1 tablet (50 mg total) by mouth every 6 (six) hours as needed for anxiety. 03/24/24   Jadapalle, Sree, MD  insulin  lispro (HUMALOG ) 100 UNIT/ML injection Inject 0-9 Units into the skin 3 (three) times daily with meals and 0-5 units at bedtime as directed . 03/24/24   Donnelly Mellow, MD  Insulin  Pen Needle (TECHLITE PEN NEEDLES) 32G X 4 MM MISC Use as directed. 09/28/23   Danton Jon HERO, PA-C  QUEtiapine  (SEROQUEL ) 400 MG tablet Take 1 tablet (400 mg total) by mouth 2 (two) times daily. 03/31/24   Jeanifer Halliday, PA-C  rosuvastatin  (CRESTOR ) 40 MG tablet Take 1 tablet (40 mg total) by mouth once daily. 09/28/23   McClung, Angela M, PA-C  TRUEplus Lancets 28G MISC Use to check blood sugar up to 3 times daily. 05/19/23   Brien Belvie BRAVO, MD  insulin  aspart (NOVOLOG ) 100 UNIT/ML injection Inject 3 Units into the skin 3 (three) times daily with meals. 03/24/24 03/24/24  Jadapalle, Sree, MD    Allergies: Patient has no known allergies.    Review of Systems  Updated Vital  Signs BP (!) 131/96 (BP Location: Right Arm)   Pulse (!) 103   Temp 97.8 F (36.6 C)   Resp 16   Ht 5' 7 (1.702 m)   Wt 68.5 kg   SpO2 100%   BMI 23.65 kg/m   Physical Exam Vitals and nursing note reviewed.  Constitutional:      Appearance: She is well-developed.  HENT:     Head: Normocephalic and atraumatic.   Eyes:     Conjunctiva/sclera: Conjunctivae normal.   Pulmonary:     Effort: No respiratory distress.   Musculoskeletal:     Cervical back: Normal range of motion and neck supple.   Skin:    General: Skin is warm and dry.   Neurological:     Mental Status: She is alert.   Psychiatric:        Mood and Affect: Mood normal.        Behavior: Behavior normal.    Patient seen and examined. History obtained directly from patient.  I can see in the computer where provider attempted to send in a prescription on 03/28/2024 which is consistent with patient's history.  I called the pharmacy to see if they had received this prescription.  They confirmed that they did not.  I will plan to resend the prescription as prescribed on 6/23.  Patient requesting that this go to [facility] pharmacy.  Encourage follow-up as needed.  (all labs ordered are listed, but only abnormal results are displayed) Labs Reviewed - No data to display  EKG: None  Radiology: No results found.   Procedures   Medications Ordered in the ED - No data to display                                  Medical Decision Making Risk Prescription drug management.   Patient here for medication refill.  I can see that her provider tried to prescribe this on 6/23, however this was not successful.  I have repeated this prescription to the patient's desired pharmacy.  Patient appears appropriate at this time.     Final diagnoses:  Encounter for medication refill    ED Discharge Orders          Ordered    QUEtiapine  (SEROQUEL ) 400 MG tablet  2 times daily       Note to Pharmacy:  Future refills to come from Psych   03/31/24 0905               Desiderio Chew, PA-C 03/31/24 9091    Doretha Folks, MD 04/01/24 1040

## 2024-03-31 NOTE — Discharge Instructions (Signed)
 Please obtain future refills through your psychiatrist office.

## 2024-03-31 NOTE — ED Triage Notes (Signed)
 Pt states she is here to get Seroquel  refill. Denies SI and HI.

## 2024-04-01 NOTE — Telephone Encounter (Signed)
 Call to patient unable to left message VM full

## 2024-04-05 ENCOUNTER — Other Ambulatory Visit: Payer: Self-pay

## 2024-04-12 ENCOUNTER — Ambulatory Visit: Payer: Self-pay | Attending: Family Medicine

## 2024-04-12 VITALS — Ht 67.0 in | Wt 150.0 lb

## 2024-04-12 DIAGNOSIS — Z0000 Encounter for general adult medical examination without abnormal findings: Secondary | ICD-10-CM

## 2024-04-12 NOTE — Patient Instructions (Addendum)
 Ms. Grussing , Thank you for taking time out of your busy schedule to complete your Annual Wellness Visit with me. I enjoyed our conversation and look forward to speaking with you again next year. I, as well as your care team,  appreciate your ongoing commitment to your health goals. Please review the following plan we discussed and let me know if I can assist you in the future. Your Game plan/ To Do List    Referrals: If you haven't heard from the office you've been referred to, please reach out to them at the phone provided.   Follow up Visits: Next Medicare AWV with our clinical staff: 05/09/2025 at 8:30 a.m. phone visit with Nurse Roz   Have you seen your provider in the last 6 months (3 months if uncontrolled diabetes)? Yes Next Office Visit with your provider: Patient will call to schedule with Dr. Delbert  Clinician Recommendations:  Aim for 30 minutes of exercise or brisk walking, 6-8 glasses of water, and 5 servings of fruits and vegetables each day.       This is a list of the screening recommended for you and due dates:  Health Maintenance  Topic Date Due   DTaP/Tdap/Td vaccine (1 - Tdap) Never done   Pap with HPV screening  Never done   Eye exam for diabetics  11/30/2020   Stool Blood Test  09/06/2022   Screening for Lung Cancer  12/09/2023   Flu Shot  05/06/2024   Yearly kidney health urinalysis for diabetes  05/18/2024   Hemoglobin A1C  07/28/2024   Complete foot exam   01/26/2025   Yearly kidney function blood test for diabetes  03/18/2025   Mammogram  04/14/2025   Hepatitis C Screening  Completed   HIV Screening  Completed   Hepatitis B Vaccine  Aged Out   HPV Vaccine  Aged Out   Meningitis B Vaccine  Aged Out   Pneumococcal Vaccination  Discontinued   Colon Cancer Screening  Discontinued   COVID-19 Vaccine  Discontinued   Zoster (Shingles) Vaccine  Discontinued    Advanced directives: (Declined) Advance directive discussed with you today. Even though you declined  this today, please call our office should you change your mind, and we can give you the proper paperwork for you to fill out. Advance Care Planning is important because it:  [x]  Makes sure you receive the medical care that is consistent with your values, goals, and preferences  [x]  It provides guidance to your family and loved ones and reduces their decisional burden about whether or not they are making the right decisions based on your wishes.  Follow the link provided in your after visit summary or read over the paperwork we have mailed to you to help you started getting your Advance Directives in place. If you need assistance in completing these, please reach out to us  so that we can help you!  See attachments for Preventive Care and Fall Prevention Tips.

## 2024-04-12 NOTE — Progress Notes (Signed)
 Because this visit was a virtual/telehealth visit,  certain criteria was not obtained, such a blood pressure, CBG if applicable, and timed get up and go. Any medications not marked as taking were not mentioned during the medication reconciliation part of the visit. Any vitals not documented were not able to be obtained due to this being a telehealth visit or patient was unable to self-report a recent blood pressure reading due to a lack of equipment at home via telehealth. Vitals that have been documented are verbally provided by the patient.   Subjective:   VENESA SEMIDEY is a 63 y.o. who presents for a Medicare Wellness preventive visit.  As a reminder, Annual Wellness Visits don't include a physical exam, and some assessments may be limited, especially if this visit is performed virtually. We may recommend an in-person follow-up visit with your provider if needed.  Visit Complete: Virtual I connected with  Lawren T Baisley on 04/12/24 by a audio enabled telemedicine application and verified that I am speaking with the correct person using two identifiers.  Patient Location: Home  Provider Location: Office/Clinic  I discussed the limitations of evaluation and management by telemedicine. The patient expressed understanding and agreed to proceed.  Vital Signs: Because this visit was a virtual/telehealth visit, some criteria may be missing or patient reported. Any vitals not documented were not able to be obtained and vitals that have been documented are patient reported.  VideoDeclined- This patient declined Librarian, academic. Therefore the visit was completed with audio only.  Persons Participating in Visit: Patient.  AWV Questionnaire: No: Patient Medicare AWV questionnaire was not completed prior to this visit.  Cardiac Risk Factors include: advanced age (>24men, >84 women);sedentary lifestyle;diabetes mellitus;dyslipidemia;hypertension;smoking/ tobacco  exposure     Objective:    Today's Vitals   04/12/24 0839  Weight: 150 lb (68 kg)  Height: 5' 7 (1.702 m)  PainSc: 0-No pain   Body mass index is 23.49 kg/m.     04/12/2024    8:40 AM 03/19/2024    4:00 PM 02/23/2024   11:43 PM 04/13/2023   11:00 PM 04/13/2023    8:11 AM 04/08/2023   11:44 AM 03/31/2023   10:14 AM  Advanced Directives  Does Patient Have a Medical Advance Directive? No  No No No No No  Would patient like information on creating a medical advance directive? No - Patient declined   No - Patient declined  Yes (MAU/Ambulatory/Procedural Areas - Information given)      Information is confidential and restricted. Go to Review Flowsheets to unlock data.    Current Medications (verified) Outpatient Encounter Medications as of 04/12/2024  Medication Sig   Blood Glucose Monitoring Suppl (TRUE METRIX METER) w/Device KIT Use to check blood sugar up to 3 times daily.   glipiZIDE  (GLUCOTROL  XL) 5 MG 24 hr tablet Take 1 tablet (5 mg total) by mouth 2 (two) times daily after a meal.   glucose blood (TRUE METRIX BLOOD GLUCOSE TEST) test strip Use to check blood sugar up to 3 times daily.   hydrOXYzine  (ATARAX ) 50 MG tablet Take 1 tablet (50 mg total) by mouth every 6 (six) hours as needed for anxiety.   insulin  lispro (HUMALOG ) 100 UNIT/ML injection Inject 0-9 Units into the skin 3 (three) times daily with meals and 0-5 units at bedtime as directed .   Insulin  Pen Needle (TECHLITE PEN NEEDLES) 32G X 4 MM MISC Use as directed.   QUEtiapine  (SEROQUEL ) 400 MG tablet Take  1 tablet (400 mg total) by mouth 2 (two) times daily.   rosuvastatin  (CRESTOR ) 40 MG tablet Take 1 tablet (40 mg total) by mouth once daily.   TRUEplus Lancets 28G MISC Use to check blood sugar up to 3 times daily.   [DISCONTINUED] insulin  aspart (NOVOLOG ) 100 UNIT/ML injection Inject 3 Units into the skin 3 (three) times daily with meals.   No facility-administered encounter medications on file as of 04/12/2024.     Allergies (verified) Patient has no known allergies.   History: Past Medical History:  Diagnosis Date   Anxiety    Arthritis    Bipolar 1 disorder (HCC)    Cervical myelopathy (HCC) 12/28/2020   Cervical spinal cord compression (HCC) 12/19/2020   Depression    Diabetes mellitus without complication (HCC)    Type II   GERD (gastroesophageal reflux disease)    Heart murmur    nothing to worry about   HNP (herniated nucleus pulposus) with myelopathy, cervical    HTN (hypertension)    not on medication   Pneumonia    Quadriplegia and quadriparesis (HCC)    Stroke (HCC) 11/2020   per patient did not have a stroke   Past Surgical History:  Procedure Laterality Date   ABDOMINAL HYSTERECTOMY     ANTERIOR CERVICAL DECOMP/DISCECTOMY FUSION N/A 12/25/2020   Procedure: CERVICAL THREE-FOUR ANTERIOR CERVICAL DECOMPRESSION/DISCECTOMY FUSION;  Surgeon: Cheryle Debby LABOR, MD;  Location: [facility] OR;  Service: Neurosurgery;  Laterality: N/A;  CERVICAL THREE-FOUR ANTERIOR CERVICAL DECOMPRESSION/DISCECTOMY FUSION    ANTERIOR CERVICAL DECOMP/DISCECTOMY FUSION N/A 04/13/2023   Procedure: C3-5 ANTERIOR CERVICAL DISCECTOMY AND FUSION (HEDRON);  Surgeon: Clois Fret, MD;  Location: [facility] ORS;  Service: Neurosurgery;  Laterality: N/A;   LUMBAR LAMINECTOMY/DECOMPRESSION MICRODISCECTOMY Left 04/07/2016   Procedure: Left Lumbar four-five Microdiskectomy;  Surgeon: Fairy Levels, MD;  Location: [facility];  Service: Neurosurgery;  Laterality: Left;   Family History  Problem Relation Age of Onset   Healthy Mother    Diabetes Mellitus II Father    Breast cancer Sister    Diabetes Brother    Heart disease Neg Hx    Stroke Neg Hx    Kidney disease Neg Hx    Social History   Socioeconomic History   Marital status: Divorced    Spouse name: Not on file   Number of children: 3   Years of education: Not on file   Highest education level: High school graduate  Occupational History   Not on file   Tobacco Use   Smoking status: Every Day    Current packs/day: 1.00    Average packs/day: 1 pack/day for 39.0 years (39.0 ttl pk-yrs)    Types: Cigarettes   Smokeless tobacco: Never  Vaping Use   Vaping status: Never Used  Substance and Sexual Activity   Alcohol use: No   Drug use: Yes    Types: Marijuana    Comment: Cocaine -- 04/06/16- last time, smokes marijuana   Sexual activity: Yes    Birth control/protection: Surgical  Other Topics Concern   Not on file  Social History Narrative   Lives with daughter   Right Handed   Drinks 1 cup of coffee per day      Social Drivers of Health   Financial Resource Strain: Low Risk  (04/12/2024)   Overall Financial Resource Strain (CARDIA)    Difficulty of Paying Living Expenses: Not hard at all  Food Insecurity: No Food Insecurity (04/12/2024)   Hunger Vital Sign  Worried About Programme researcher, broadcasting/film/video in the Last Year: Never true    Ran Out of Food in the Last Year: Never true  Transportation Needs: No Transportation Needs (04/12/2024)   PRAPARE - Administrator, Civil Service (Medical): No    Lack of Transportation (Non-Medical): No  Physical Activity: Inactive (04/12/2024)   Exercise Vital Sign    Days of Exercise per Week: 0 days    Minutes of Exercise per Session: 0 min  Stress: Stress Concern Present (04/12/2024)   [employer] of Occupational Health - Occupational Stress Questionnaire    Feeling of Stress: To some extent  Social Connections: Moderately Isolated (04/12/2024)   Social Connection and Isolation Panel    Frequency of Communication with Friends and Family: More than three times a week    Frequency of Social Gatherings with Friends and Family: Three times a week    Attends Religious Services: More than 4 times per year    Active Member of Clubs or Organizations: No    Attends Banker Meetings: Never    Marital Status: Divorced    Tobacco Counseling Ready to quit: Not Answered Counseling  given: Not Answered    Clinical Intake:  Pre-visit preparation completed: Yes  Pain : No/denies pain Pain Score: 0-No pain     BMI - recorded: 23.49 Nutritional Status: BMI of 19-24  Normal Nutritional Risks: None Diabetes: Yes CBG done?: No Did pt. bring in CBG monitor from home?: No  Lab Results  Component Value Date   HGBA1C 12.5 (A) 01/27/2024   HGBA1C 9.8 (H) 09/28/2023   HGBA1C 6.2 03/23/2023     How often do you need to have someone help you when you read instructions, pamphlets, or other written materials from your doctor or pharmacy?: 1 - Never What is the last grade level you completed in school?: High School Graduate  Interpreter Needed?: No  Information entered by :: Jeraldine Primeau N. Ataya Murdy, LPN.   Activities of Daily Living     04/12/2024    8:42 AM 03/19/2024    4:00 PM  In your present state of health, do you have any difficulty performing the following activities:  Hearing? 0   Vision? 0   Difficulty concentrating or making decisions? 0   Walking or climbing stairs? 0   Dressing or bathing? 0   Doing errands, shopping? 0   Preparing Food and eating ? N   Using the Toilet? N   In the past six months, have you accidently leaked urine? N   Do you have problems with loss of bowel control? N   Managing your Medications? N   Managing your Finances? N   Housekeeping or managing your Housekeeping? N      Information is confidential and restricted. Go to Review Flowsheets to unlock data.    Patient Care Team: Delbert Clam, MD as PCP - General (Family Medicine) [facility] as Consulting Physician (Optometry)  I have updated your Care Teams any recent Medical Services you may have received from other providers in the past year.     Assessment:   This is a routine wellness examination for Danayah.  Hearing/Vision screen Hearing Screening - Comments:: Adequate hearing, no hearing aids needed. Vision Screening - Comments::  Adequate vision with eyeglasses. Up to date with eye exam with [employer] [facility].   Goals Addressed             This Visit's Progress  04/12/2024: Keep my hands in God's hands.         Depression Screen     04/12/2024    8:50 AM 01/27/2024   11:06 AM 09/28/2023    3:15 PM 04/08/2023   11:42 AM 01/13/2023   11:10 AM 11/11/2022   11:12 AM 06/04/2022   11:21 AM  PHQ 2/9 Scores  PHQ - 2 Score 0 3  1 1 3 5   PHQ- 9 Score 0 9  10 11 7 9   Exception Documentation   Patient refusal        Fall Risk     04/12/2024    8:47 AM 01/27/2024   11:06 AM 09/28/2023    3:15 PM 05/19/2023   10:44 AM 04/08/2023   11:44 AM  Fall Risk   Falls in the past year? 0 0 0 0 0  Number falls in past yr: 0 0 0 0 0  Injury with Fall? 0 0 0 0 0  Risk for fall due to : No Fall Risks No Fall Risks No Fall Risks No Fall Risks No Fall Risks  Follow up Falls evaluation completed Falls evaluation completed Falls evaluation completed  Falls prevention discussed;Education provided;Falls evaluation completed    MEDICARE RISK AT HOME:  Medicare Risk at Home Any stairs in or around the home?: No If so, are there any without handrails?: No Home free of loose throw rugs in walkways, pet beds, electrical cords, etc?: Yes Adequate lighting in your home to reduce risk of falls?: Yes Life alert?: No Use of a cane, walker or w/c?: No Grab bars in the bathroom?: No Shower chair or bench in shower?: No Elevated toilet seat or a handicapped toilet?: No  TIMED UP AND GO:  Was the test performed?  No  Cognitive Function: Declined/Normal: No cognitive concerns noted by patient or family. Patient alert, oriented, able to answer questions appropriately and recall recent events. No signs of memory loss or confusion.    04/12/2024    8:49 AM  MMSE - Mini Mental State Exam  Not completed: Unable to complete        04/12/2024    8:46 AM 04/08/2023   11:44 AM  6CIT Screen  What Year? 0 points 0 points  What month? 0  points 0 points  What time? 0 points 0 points  Count back from 20 0 points 0 points  Months in reverse 0 points 0 points  Repeat phrase 0 points 0 points  Total Score 0 points 0 points    Immunizations Immunization History  Administered Date(s) Administered   Moderna Sars-Covid-2 Vaccination 09/04/2020, 10/03/2020    Screening Tests Health Maintenance  Topic Date Due   DTaP/Tdap/Td (1 - Tdap) Never done   Cervical Cancer Screening (HPV/Pap Cotest)  Never done   OPHTHALMOLOGY EXAM  11/30/2020   COLON CANCER SCREENING ANNUAL FOBT  09/06/2022   Lung Cancer Screening  12/09/2023   INFLUENZA VACCINE  05/06/2024   Diabetic kidney evaluation - Urine ACR  05/18/2024   HEMOGLOBIN A1C  07/28/2024   FOOT EXAM  01/26/2025   Diabetic kidney evaluation - eGFR measurement  03/18/2025   MAMMOGRAM  04/14/2025   Hepatitis C Screening  Completed   HIV Screening  Completed   Hepatitis B Vaccines  Aged Out   HPV VACCINES  Aged Out   Meningococcal B Vaccine  Aged Out   Pneumococcal Vaccine 96-57 Years old  Discontinued   Colonoscopy  Discontinued   COVID-19 Vaccine  Discontinued  Zoster Vaccines- Shingrix  Discontinued    Health Maintenance  Health Maintenance Due  Topic Date Due   DTaP/Tdap/Td (1 - Tdap) Never done   Cervical Cancer Screening (HPV/Pap Cotest)  Never done   OPHTHALMOLOGY EXAM  11/30/2020   COLON CANCER SCREENING ANNUAL FOBT  09/06/2022   Lung Cancer Screening  12/09/2023   Health Maintenance Items Addressed: Yes Patient aware of current care gaps.  Immunization record was verified by [employer]. Patient declined vaccines and referrals at this time due to personal reasons.  Additional Screening:  Vision Screening: Recommended annual ophthalmology exams for early detection of glaucoma and other disorders of the eye. Would you like a referral to an eye doctor? No    Dental Screening: Recommended annual dental exams for proper oral hygiene  Community Resource Referral /  Chronic Care Management: CRR required this visit?  No   CCM required this visit?  No   Plan:    I have personally reviewed and noted the following in the patient's chart:   Medical and social history Use of alcohol, tobacco or illicit drugs  Current medications and supplements including opioid prescriptions. Patient is not currently taking opioid prescriptions. Functional ability and status Nutritional status Physical activity Advanced directives List of other physicians Hospitalizations, surgeries, and ER visits in previous 12 months Vitals Screenings to include cognitive, depression, and falls Referrals and appointments  In addition, I have reviewed and discussed with patient certain preventive protocols, quality metrics, and best practice recommendations. A written personalized care plan for preventive services as well as general preventive health recommendations were provided to patient.   Roz LOISE Fuller, LPN   11/06/7972   After Visit Summary: (MyChart) Due to this being a telephonic visit, the after visit summary with patients personalized plan was offered to patient via MyChart   Notes: Patient aware of current care gaps.  Immunization record was verified by [employer]. Patient declined vaccines and referrals at this time due to personal reasons.

## 2024-04-25 ENCOUNTER — Telehealth: Payer: Self-pay | Admitting: Family Medicine

## 2024-04-25 NOTE — Telephone Encounter (Signed)
 The letter from Dr.Newlin regarding care transition and referral to psychiatry for management has been sent to patient through mail.

## 2024-05-06 DEATH — deceased

## 2024-05-18 ENCOUNTER — Ambulatory Visit: Admitting: Internal Medicine

## 2025-05-09 ENCOUNTER — Ambulatory Visit
